# Patient Record
Sex: Male | Born: 1950 | Race: Black or African American | Hispanic: No | Smoking: Current every day smoker
Health system: Southern US, Community
[De-identification: ages and names within clinical notes are randomized; demographics above are authoritative.]

## PROBLEM LIST (undated history)

## (undated) DIAGNOSIS — I2510 Atherosclerotic heart disease of native coronary artery without angina pectoris: Secondary | ICD-10-CM

## (undated) DIAGNOSIS — Z952 Presence of prosthetic heart valve: Secondary | ICD-10-CM

## (undated) DIAGNOSIS — I5022 Chronic systolic (congestive) heart failure: Secondary | ICD-10-CM

## (undated) DIAGNOSIS — I10 Essential (primary) hypertension: Secondary | ICD-10-CM

## (undated) DIAGNOSIS — M109 Gout, unspecified: Secondary | ICD-10-CM

## (undated) DIAGNOSIS — K219 Gastro-esophageal reflux disease without esophagitis: Secondary | ICD-10-CM

## (undated) DIAGNOSIS — E119 Type 2 diabetes mellitus without complications: Secondary | ICD-10-CM

---

## 2008-02-02 ENCOUNTER — Emergency Department: Payer: Self-pay | Admitting: Emergency Medicine

## 2015-03-15 ENCOUNTER — Encounter: Payer: Self-pay | Admitting: Emergency Medicine

## 2015-03-15 ENCOUNTER — Emergency Department
Admission: EM | Admit: 2015-03-15 | Discharge: 2015-03-15 | Disposition: A | Discharge status: Home / Self Care | Payer: Medicare Other | Attending: Emergency Medicine | Admitting: Emergency Medicine

## 2015-03-15 DIAGNOSIS — Y9389 Activity, other specified: Secondary | ICD-10-CM | POA: Diagnosis not present

## 2015-03-15 DIAGNOSIS — Y92410 Unspecified street and highway as the place of occurrence of the external cause: Secondary | ICD-10-CM | POA: Diagnosis not present

## 2015-03-15 DIAGNOSIS — I10 Essential (primary) hypertension: Secondary | ICD-10-CM | POA: Insufficient documentation

## 2015-03-15 DIAGNOSIS — F17200 Nicotine dependence, unspecified, uncomplicated: Secondary | ICD-10-CM | POA: Diagnosis not present

## 2015-03-15 DIAGNOSIS — Y998 Other external cause status: Secondary | ICD-10-CM | POA: Diagnosis not present

## 2015-03-15 DIAGNOSIS — S199XXA Unspecified injury of neck, initial encounter: Secondary | ICD-10-CM | POA: Diagnosis present

## 2015-03-15 DIAGNOSIS — S161XXA Strain of muscle, fascia and tendon at neck level, initial encounter: Secondary | ICD-10-CM | POA: Diagnosis not present

## 2015-03-15 DIAGNOSIS — E119 Type 2 diabetes mellitus without complications: Secondary | ICD-10-CM | POA: Diagnosis not present

## 2015-03-15 HISTORY — DX: Gout, unspecified: M10.9

## 2015-03-15 HISTORY — DX: Essential (primary) hypertension: I10

## 2015-03-15 HISTORY — DX: Type 2 diabetes mellitus without complications: E11.9

## 2015-03-15 MED ORDER — BACLOFEN 10 MG PO TABS: 10.0000 mg | ORAL_TABLET | Freq: Three times a day (TID) | ORAL | Status: DC

## 2015-03-15 MED ORDER — NAPROXEN 500 MG PO TABS: 500.0000 mg | ORAL_TABLET | Freq: Two times a day (BID) | ORAL | Status: DC
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
## 2015-03-15 NOTE — Discharge Instructions (Signed)
Cervical Sprain  A cervical sprain is an injury in the neck in which the strong, fibrous tissues (ligaments) that connect your neck bones stretch or tear. Cervical sprains can range from mild to severe. Severe cervical sprains can cause the neck vertebrae to be unstable. This can lead to damage of the spinal cord and can result in serious nervous system problems. The amount of time it takes for a cervical sprain to get better depends on the cause and extent of the injury. Most cervical sprains heal in 1 to 3 weeks.  CAUSES   Severe cervical sprains may be caused by:    Contact sport injuries (such as from football, rugby, wrestling, hockey, auto racing, gymnastics, diving, martial arts, or boxing).    Motor vehicle collisions.    Whiplash injuries. This is an injury from a sudden forward and backward whipping movement of the head and neck.   Falls.   Mild cervical sprains may be caused by:    Being in an awkward position, such as while cradling a telephone between your ear and shoulder.    Sitting in a chair that does not offer proper support.    Working at a poorly designed computer station.    Looking up or down for long periods of time.   SYMPTOMS    Pain, soreness, stiffness, or a burning sensation in the front, back, or sides of the neck. This discomfort may develop immediately after the injury or slowly, 24 hours or more after the injury.    Pain or tenderness directly in the middle of the back of the neck.    Shoulder or upper back pain.    Limited ability to move the neck.    Headache.    Dizziness.    Weakness, numbness, or tingling in the hands or arms.    Muscle spasms.    Difficulty swallowing or chewing.    Tenderness and swelling of the neck.   DIAGNOSIS   Most of the time your health care provider can diagnose a cervical sprain by taking your history and doing a physical exam. Your health care provider will ask about previous neck injuries and any known neck  problems, such as arthritis in the neck. X-rays may be taken to find out if there are any other problems, such as with the bones of the neck. Other tests, such as a CT scan or MRI, may also be needed.   TREATMENT   Treatment depends on the severity of the cervical sprain. Mild sprains can be treated with rest, keeping the neck in place (immobilization), and pain medicines. Severe cervical sprains are immediately immobilized. Further treatment is done to help with pain, muscle spasms, and other symptoms and may include:   Medicines, such as pain relievers, numbing medicines, or muscle relaxants.    Physical therapy. This may involve stretching exercises, strengthening exercises, and posture training. Exercises and improved posture can help stabilize the neck, strengthen muscles, and help stop symptoms from returning.   HOME CARE INSTRUCTIONS    Put ice on the injured area.     Put ice in a plastic bag.     Place a towel between your skin and the bag.     Leave the ice on for 15-20 minutes, 3-4 times a day.    If your injury was severe, you may have been given a cervical collar to wear. A cervical collar is a two-piece collar designed to keep your neck from moving while it heals.      Do not remove the collar unless instructed by your health care provider.    If you have long hair, keep it outside of the collar.    Ask your health care provider before making any adjustments to your collar. Minor adjustments may be required over time to improve comfort and reduce pressure on your chin or on the back of your head.    Ifyou are allowed to remove the collar for cleaning or bathing, follow your health care provider's instructions on how to do so safely.    Keep your collar clean by wiping it with mild soap and water and drying it completely. If the collar you have been given includes removable pads, remove them every 1-2 days and hand wash them with soap and water. Allow them to air dry. They should be completely  dry before you wear them in the collar.    If you are allowed to remove the collar for cleaning and bathing, wash and dry the skin of your neck. Check your skin for irritation or sores. If you see any, tell your health care provider.    Do not drive while wearing the collar.    Only take over-the-counter or prescription medicines for pain, discomfort, or fever as directed by your health care provider.    Keep all follow-up appointments as directed by your health care provider.    Keep all physical therapy appointments as directed by your health care provider.    Make any needed adjustments to your workstation to promote good posture.    Avoid positions and activities that make your symptoms worse.    Warm up and stretch before being active to help prevent problems.   SEEK MEDICAL CARE IF:    Your pain is not controlled with medicine.    You are unable to decrease your pain medicine over time as planned.    Your activity level is not improving as expected.   SEEK IMMEDIATE MEDICAL CARE IF:    You develop any bleeding.   You develop stomach upset.   You have signs of an allergic reaction to your medicine.    Your symptoms get worse.    You develop new, unexplained symptoms.    You have numbness, tingling, weakness, or paralysis in any part of your body.   MAKE SURE YOU:    Understand these instructions.   Will watch your condition.   Will get help right away if you are not doing well or get worse.     This information is not intended to replace advice given to you by your health care provider. Make sure you discuss any questions you have with your health care provider.     Document Released: 12/19/2006 Document Revised: 02/26/2013 Document Reviewed: 08/29/2012  Elsevier Interactive Patient Education 2016 [employer]  Motor Vehicle Collision  It is common to have multiple bruises and sore muscles after a motor vehicle collision (MVC). These tend to feel worse for the first 24 hours.  You may have the most stiffness and soreness over the first several hours. You may also feel worse when you wake up the first morning after your collision. After this point, you will usually begin to improve with each day. The speed of improvement often depends on the severity of the collision, the number of injuries, and the location and nature of these injuries.  HOME CARE INSTRUCTIONS   Put ice on the injured area.   Put ice in a plastic bag.   Place   a towel between your skin and the bag.   Leave the ice on for 15-20 minutes, 3-4 times a day, or as directed by your health care provider.   Drink enough fluids to keep your urine clear or pale yellow. Do not drink alcohol.   Take a warm shower or bath once or twice a day. This will increase blood flow to sore muscles.   You may return to activities as directed by your caregiver. Be careful when lifting, as this may aggravate neck or back pain.   Only take over-the-counter or prescription medicines for pain, discomfort, or fever as directed by your caregiver. Do not use aspirin. This may increase bruising and bleeding.  SEEK IMMEDIATE MEDICAL CARE IF:   You have numbness, tingling, or weakness in the arms or legs.   You develop severe headaches not relieved with medicine.   You have severe neck pain, especially tenderness in the middle of the back of your neck.   You have changes in bowel or bladder control.   There is increasing pain in any area of the body.   You have shortness of breath, light-headedness, dizziness, or fainting.   You have chest pain.   You feel sick to your stomach (nauseous), throw up (vomit), or sweat.   You have increasing abdominal discomfort.   There is blood in your urine, stool, or vomit.   You have pain in your shoulder (shoulder strap areas).   You feel your symptoms are getting worse.  MAKE SURE YOU:   Understand these instructions.   Will watch your condition.   Will get help right away if you are not doing well  or get worse.     This information is not intended to replace advice given to you by your health care provider. Make sure you discuss any questions you have with your health care provider.     Document Released: 02/21/2005 Document Revised: 03/14/2014 Document Reviewed: 07/21/2010  Elsevier Interactive Patient Education 2016 [employer]
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
## 2015-03-20 MED ORDER — MORPHINE SULFATE (PF) 4 MG/ML IV SOLN
INTRAVENOUS | Status: AC
  Filled 2015-03-20: qty 1

## 2016-10-09 ENCOUNTER — Other Ambulatory Visit
Admit: 2016-10-09 | Discharge: 2016-10-09 | Disposition: A | Discharge status: Home / Self Care | Payer: Medicare Other | Attending: Family Medicine | Admitting: Family Medicine

## 2016-10-09 NOTE — Progress Notes (Signed)
Pt here with Officer Vear Clock from [employer] for lab draw. Pt gave verbal consent for labs to be drawn by this RN at this time. Puncture site was cleansed with betadine and was in the left [facility]. Pt tolerated the procedure well.  Officer Clinton given the blood samples and were placed in evidence collection bag with patient's seal over top.  Pt tolerated procedure well.  Pt escorted by [employer].

## 2017-07-24 ENCOUNTER — Emergency Department: Payer: Medicare Other

## 2017-07-24 ENCOUNTER — Other Ambulatory Visit: Payer: Self-pay

## 2017-07-24 ENCOUNTER — Encounter: Payer: Self-pay | Admitting: Emergency Medicine

## 2017-07-24 ENCOUNTER — Inpatient Hospital Stay
Admission: EM | Admit: 2017-07-24 | Discharge: 2017-07-26 | DRG: 247 | Disposition: A | Discharge status: Home / Self Care | Payer: Medicare Other | Attending: Internal Medicine | Admitting: Internal Medicine

## 2017-07-24 DIAGNOSIS — I2510 Atherosclerotic heart disease of native coronary artery without angina pectoris: Secondary | ICD-10-CM | POA: Diagnosis present

## 2017-07-24 DIAGNOSIS — S99921A Unspecified injury of right foot, initial encounter: Secondary | ICD-10-CM | POA: Diagnosis present

## 2017-07-24 DIAGNOSIS — Z79899 Other long term (current) drug therapy: Secondary | ICD-10-CM

## 2017-07-24 DIAGNOSIS — W208XXA Other cause of strike by thrown, projected or falling object, initial encounter: Secondary | ICD-10-CM | POA: Diagnosis present

## 2017-07-24 DIAGNOSIS — I119 Hypertensive heart disease without heart failure: Secondary | ICD-10-CM | POA: Diagnosis present

## 2017-07-24 DIAGNOSIS — F17210 Nicotine dependence, cigarettes, uncomplicated: Secondary | ICD-10-CM | POA: Diagnosis present

## 2017-07-24 DIAGNOSIS — Z8249 Family history of ischemic heart disease and other diseases of the circulatory system: Secondary | ICD-10-CM | POA: Diagnosis not present

## 2017-07-24 DIAGNOSIS — Z9114 Patient's other noncompliance with medication regimen: Secondary | ICD-10-CM | POA: Diagnosis not present

## 2017-07-24 DIAGNOSIS — I214 Non-ST elevation (NSTEMI) myocardial infarction: Principal | ICD-10-CM

## 2017-07-24 DIAGNOSIS — Z880 Allergy status to penicillin: Secondary | ICD-10-CM

## 2017-07-24 DIAGNOSIS — E119 Type 2 diabetes mellitus without complications: Secondary | ICD-10-CM | POA: Diagnosis present

## 2017-07-24 DIAGNOSIS — R42 Dizziness and giddiness: Secondary | ICD-10-CM | POA: Diagnosis present

## 2017-07-24 DIAGNOSIS — Z791 Long term (current) use of non-steroidal anti-inflammatories (NSAID): Secondary | ICD-10-CM | POA: Diagnosis not present

## 2017-07-24 DIAGNOSIS — E782 Mixed hyperlipidemia: Secondary | ICD-10-CM | POA: Diagnosis present

## 2017-07-24 DIAGNOSIS — Z716 Tobacco abuse counseling: Secondary | ICD-10-CM | POA: Diagnosis not present

## 2017-07-24 DIAGNOSIS — M109 Gout, unspecified: Secondary | ICD-10-CM | POA: Diagnosis present

## 2017-07-24 DIAGNOSIS — R52 Pain, unspecified: Secondary | ICD-10-CM

## 2017-07-24 LAB — COMPREHENSIVE METABOLIC PANEL
ALK PHOS: 69 U/L (ref 38–126)
ALT: 18 U/L (ref 17–63)
ANION GAP: 8 (ref 5–15)
AST: 24 U/L (ref 15–41)
Albumin: 4.1 g/dL (ref 3.5–5.0)
BILIRUBIN TOTAL: 1.1 mg/dL (ref 0.3–1.2)
BUN: 16 mg/dL (ref 6–20)
CALCIUM: 9.2 mg/dL (ref 8.9–10.3)
CO2: 22 mmol/L (ref 22–32)
Chloride: 108 mmol/L (ref 101–111)
Creatinine, Ser: 0.81 mg/dL (ref 0.61–1.24)
GFR calc Af Amer: >60 mL/min (ref >60)
GLUCOSE: 118 mg/dL — AB (ref 65–99)
POTASSIUM: 3.6 mmol/L (ref 3.5–5.1)
Sodium: 138 mmol/L (ref 135–145)
TOTAL PROTEIN: 8.1 g/dL (ref 6.5–8.1)

## 2017-07-24 LAB — APTT: aPTT: 32 seconds (ref 24–36)

## 2017-07-24 LAB — CBC
HEMATOCRIT: 55.2 % — AB (ref 40.0–52.0)
Hemoglobin: 18 g/dL (ref 13.0–18.0)
MCH: 26.8 pg (ref 26.0–34.0)
MCHC: 32.6 g/dL (ref 32.0–36.0)
MCV: 82.2 fL (ref 80.0–100.0)
PLATELETS: 313 10*3/uL (ref 150–440)
RBC: 6.72 MIL/uL — ABNORMAL HIGH (ref 4.40–5.90)
RDW: 17 % — AB (ref 11.5–14.5)
WBC: 8.2 10*3/uL (ref 3.8–10.6)

## 2017-07-24 LAB — LIPID PANEL
CHOL/HDL RATIO: 3.7 ratio
Cholesterol: 149 mg/dL (ref 0–200)
HDL: 40 mg/dL — AB (ref >40)
LDL CALC: 98 mg/dL (ref 0–99)
Triglycerides: 55 mg/dL (ref <150)
VLDL: 11 mg/dL (ref 0–40)

## 2017-07-24 LAB — PROTIME-INR
INR: 1.06
Prothrombin Time: 13.7 seconds (ref 11.4–15.2)

## 2017-07-24 LAB — TROPONIN I
TROPONIN I: 0.77 ng/mL — AB (ref <0.03)
TROPONIN I: 0.85 ng/mL — AB (ref <0.03)

## 2017-07-24 MED ORDER — HEPARIN (PORCINE) IN NACL 100-0.45 UNIT/ML-% IJ SOLN
900.0000 [IU]/h | INTRAMUSCULAR | Status: DC
  Administered 2017-07-24: 900 [IU]/h via INTRAVENOUS
  Filled 2017-07-24 (×2): qty 250

## 2017-07-24 MED ORDER — HEPARIN BOLUS VIA INFUSION
4000.0000 [IU] | Freq: Once | INTRAVENOUS | Status: AC
  Administered 2017-07-24: 4000 [IU] via INTRAVENOUS
  Filled 2017-07-24: qty 4000

## 2017-07-24 MED ORDER — METOPROLOL TARTRATE 25 MG PO TABS
25.0000 mg | ORAL_TABLET | Freq: Two times a day (BID) | ORAL | Status: DC
  Administered 2017-07-24 – 2017-07-25 (×2): 25 mg via ORAL
  Filled 2017-07-24 (×4): qty 1

## 2017-07-24 MED ORDER — NITROGLYCERIN 0.4 MG SL SUBL: 0.4000 mg | SUBLINGUAL_TABLET | SUBLINGUAL | Status: DC | PRN

## 2017-07-24 MED ORDER — ASPIRIN EC 325 MG PO TBEC
325.0000 mg | DELAYED_RELEASE_TABLET | Freq: Every day | ORAL | Status: DC
  Administered 2017-07-24: 325 mg via ORAL
  Filled 2017-07-24: qty 1

## 2017-07-24 NOTE — H&P (Signed)
[facility] [place] [facility]   PATIENT NAME: Travis Maxwell    MR#:  [id number]  DATE OF BIRTH:  Jan 03, 1951  DATE OF ADMISSION:  07/24/2017  PRIMARY CARE PHYSICIAN: System, Pcp Not In   REQUESTING/REFERRING PHYSICIAN: [email or web address]  CHIEF COMPLAINT:   Chief Complaint  Patient presents with  . Dizziness    HISTORY OF PRESENT ILLNESS: Travis Maxwell  is a 67 y.o. male with a known history of diabetes, hypertension, gout- was feeling dizzy today and so came to emergency room. He denies any associated chest pain, palpitation, shortness of breath. He denies any past history of coronary artery disease. Noted to have high troponin in ER, ER physician spoke to cardiologist and he suggested to start on heparin drip.  He had some weight fell on his right second toe and his toe is hurting.  PAST MEDICAL HISTORY:   Past Medical History:  Diagnosis Date  . Diabetes mellitus without complication (HCC)   . Gout   . Hypertension     PAST SURGICAL HISTORY: History reviewed. No pertinent surgical history.  SOCIAL HISTORY:  Social History   Tobacco Use  . Smoking status: Current Every Day Smoker    Packs/day: 0.50    Types: Cigarettes  Substance Use Topics  . Alcohol use: Yes    Comment: rare    FAMILY HISTORY:  Family History  Problem Relation Age of Onset  . CAD Mother   . CAD Father     DRUG ALLERGIES:  Allergies  Allergen Reactions  . Penicillins Other (See Comments)    Has patient had a PCN reaction causing immediate rash, facial/tongue/throat swelling, SOB or lightheadedness with hypotension: Unknown Has patient had a PCN reaction causing severe rash involving mucus membranes or skin necrosis: Unknown Has patient had a PCN reaction that required hospitalization: Unknown Has patient had a PCN reaction occurring within the last 10 years: No If all of the above answers are "NO", then may proceed with Cephalosporin use.     REVIEW OF SYSTEMS:    CONSTITUTIONAL: No fever, fatigue or weakness.  EYES: No blurred or double vision.  EARS, NOSE, AND THROAT: No tinnitus or ear pain.  RESPIRATORY: No cough, shortness of breath, wheezing or hemoptysis.  CARDIOVASCULAR: No chest pain, orthopnea, edema.  GASTROINTESTINAL: No nausea, vomiting, diarrhea or abdominal pain.  GENITOURINARY: No dysuria, hematuria.  ENDOCRINE: No polyuria, nocturia,  HEMATOLOGY: No anemia, easy bruising or bleeding SKIN: No rash or lesion. MUSCULOSKELETAL: No joint pain or arthritis.   NEUROLOGIC: No tingling, numbness, weakness.  PSYCHIATRY: No anxiety or depression.   MEDICATIONS AT HOME:  Prior to Admission medications   Not on File      PHYSICAL EXAMINATION:   VITAL SIGNS: Blood pressure (!) 156/76, pulse 65, temperature 98.7 F (37.1 C), temperature source Oral, resp. rate 18, height 5\' 7"  (1.702 m), weight 77.1 kg (170 lb), SpO2 99 %.  GENERAL:  67 y.o.-year-old patient lying in the bed with no acute distress.  EYES: Pupils equal, round, reactive to light and accommodation. No scleral icterus. Extraocular muscles intact.  HEENT: Head atraumatic, normocephalic. Oropharynx and nasopharynx clear.  NECK:  Supple, no jugular venous distention. No thyroid enlargement, no tenderness.  LUNGS: Normal breath sounds bilaterally, no wheezing, rales,rhonchi or crepitation. No use of accessory muscles of respiration.  CARDIOVASCULAR: S1, S2 normal. No murmurs, rubs, or gallops.  ABDOMEN: Soft, nontender, nondistended. Bowel sounds present. No organomegaly or mass.  EXTREMITIES: No pedal edema, cyanosis, or clubbing.  NEUROLOGIC: Cranial nerves II through XII are intact. Muscle strength 5/5 in all extremities. Sensation intact. Gait not checked.  PSYCHIATRIC: The patient is alert and oriented x 3.  SKIN: No obvious rash, lesion, or ulcer.   LABORATORY PANEL:   CBC Recent Labs  Lab 07/24/17 1836  WBC 8.2  HGB 18.0  HCT 55.2*  PLT 313  MCV 82.2  MCH  26.8  MCHC 32.6  RDW 17.0*   ------------------------------------------------------------------------------------------------------------------  Chemistries  Recent Labs  Lab 07/24/17 1836  NA 138  K 3.6  CL 108  CO2 22  GLUCOSE 118*  BUN 16  CREATININE 0.81  CALCIUM 9.2  AST 24  ALT 18  ALKPHOS 69  BILITOT 1.1   ------------------------------------------------------------------------------------------------------------------ estimated creatinine clearance is 85 mL/min (by C-G formula based on SCr of 0.81 mg/dL). ------------------------------------------------------------------------------------------------------------------ No results for input(s): TSH, T4TOTAL, T3FREE, THYROIDAB in the last 72 hours.  Invalid input(s): FREET3   Coagulation profile Recent Labs  Lab 07/24/17 2004  INR 1.06   ------------------------------------------------------------------------------------------------------------------- No results for input(s): DDIMER in the last 72 hours. -------------------------------------------------------------------------------------------------------------------  Cardiac Enzymes Recent Labs  Lab 07/24/17 1836  TROPONINI 0.77*   ------------------------------------------------------------------------------------------------------------------ Invalid input(s): POCBNP  ---------------------------------------------------------------------------------------------------------------  Urinalysis No results found for: COLORURINE, APPEARANCEUR, LABSPEC, PHURINE, GLUCOSEU, HGBUR, BILIRUBINUR, KETONESUR, PROTEINUR, UROBILINOGEN, NITRITE, LEUKOCYTESUR   RADIOLOGY: Ct Head Wo Contrast  Result Date: 07/24/2017 CLINICAL DATA:  Dizziness times 2-3 weeks. EXAM: CT HEAD WITHOUT CONTRAST TECHNIQUE: Contiguous axial images were obtained from the base of the skull through the vertex without intravenous contrast. COMPARISON:  None. FINDINGS: BRAIN: The ventricles and  sulci are normal for age. No intraparenchymal hemorrhage, mass effect nor midline shift. No acute large vascular territory infarcts. Grey-white matter distinction is maintained. The basal ganglia are unremarkable. No abnormal extra-axial fluid collections. Basal cisterns are not effaced and midline. The brainstem and cerebellar hemispheres are without acute abnormalities. VASCULAR: Unremarkable. SKULL/SOFT TISSUES: No skull fracture. No significant soft tissue swelling. ORBITS/SINUSES: The included ocular globes and orbital contents are normal.The mastoid air cells are clear. The included paranasal sinuses are well-aerated. OTHER: None. IMPRESSION: Normal head CT. Electronically Signed   By: Tollie Eth M.D.   On: 07/24/2017 18:49    EKG: Orders placed or performed during the hospital encounter of 07/24/17  . ED EKG  . ED EKG    IMPRESSION AND PLAN:  * non-ST elevation MI   IV heparin drip   Follow serial troponin and monitor on telemetry.   Check lipid panel and hemoglobin A1c.   Start on aspirin and metoprolol.   Cardiology consult for further management.  * hypertension   Was started on metoprolol for now and further medication as his blood pressure.  * diabetes   Currently check hemoglobin A1c.   No meds at home, we may need to decide need for medications at the time of discharge.  * active smoking   Counseled to quit smoking for 4 minutes and offered nicotine patch.  * right second toe pain   He had injury to the toe, I will get an x-ray to check for fractures.  * High hemoglobin   His RBC count and hemoglobin is very high   Could be polycythemia?   We may refer to hematology as outpatient.    All the records are reviewed and case discussed with ED provider. Management plans discussed with the patient, family and they are in agreement.  CODE STATUS:full code    TOTAL TIME TAKING CARE OF THIS PATIENT:  50 minutes.    Altamese Dilling M.D on 07/24/2017    Between 7am to 6pm - Pager - [phone]  After 6pm go to [email or web address] - password [email or web address]  [facility]  Office  [phone]  CC: Primary care physician; System, Pcp Not In   Note: This dictation was prepared with Dragon dictation along with smaller phrase technology. Any transcriptional errors that result from this process are unintentional.

## 2017-07-24 NOTE — ED Notes (Signed)
Family at bedside. 

## 2017-07-24 NOTE — ED Triage Notes (Addendum)
Dizzy x 2 to 3 weeks per patient. States began headache today. States has been out of blood pressure medications x 2 months.

## 2017-07-24 NOTE — Progress Notes (Signed)
Family Meeting Note  Advance Directive:yes  Today a meeting took place with the Patient.  The following clinical team members were present during this meeting:MD  The following were discussed:Patient's diagnosis: non-ST elevation MI, diabetes, active smoking , Patient's progosis: Unable to determine and Goals for treatment: Full Code  Additional follow-up to be provided: Cardiology  Time spent during discussion:20 minutes  Altamese Dilling, MD

## 2017-07-24 NOTE — Progress Notes (Signed)
ANTICOAGULATION CONSULT NOTE - Initial Consult  Pharmacy Consult for heparin gtt  Indication:  Chest pain/ ACS   No Known Allergies  Patient Measurements: Height: 5\' 7"  (170.2 cm) Weight: 170 lb (77.1 kg) IBW/kg (Calculated) : 66.1 Heparin Dosing Weight: 77.1kg  Vital Signs: Temp: 98.7 F (37.1 C) (05/20 1819) Temp Source: Oral (05/20 1819) BP: 164/67 (05/20 1819) Pulse Rate: 73 (05/20 1819)  Labs: Recent Labs    07/24/17 1836  HGB 18.0  HCT 55.2*  PLT 313  CREATININE 0.81  TROPONINI 0.77*    Estimated Creatinine Clearance: 85 mL/min (by C-G formula based on SCr of 0.81 mg/dL).   Medical History: Past Medical History:  Diagnosis Date  . Diabetes mellitus without complication (HCC)   . Gout   . Hypertension     Medications:   (Not in a hospital admission) Scheduled:  . heparin  4,000 Units Intravenous Once   Infusions:  . heparin     PRN:  Anti-infectives (From admission, onward)   None      Assessment: 67 year old male with ACS requiring heparin gtt.   Goal of Therapy:  Heparin level 0.3-0.7 units/ml Monitor platelets by anticoagulation protocol: Yes   Plan:  Give 4000 units bolus x 1 Start heparin infusion at 900 units/hr Check anti-Xa level in 6 hours and daily while on heparin Continue to monitor H&H and platelets  Travis Maxwell 07/24/2017,8:04 PM

## 2017-07-24 NOTE — ED Provider Notes (Signed)
[facility] Emergency Department Provider Note   ____________________________________________    I have reviewed the triage vital signs and the nursing notes.   HISTORY  Chief Complaint Dizziness     HPI Travis Maxwell is a 67 y.o. male with a history of diabetes and high blood pressure who presents with complaints of dizziness.  Patient reports he was fishing with his granddaughter today in the heat and felt quite lightheaded and thought he might pass out.  Denies chest pain.  No shortness of breath.  Still feels dizzy now.  Has not take anything for this.  No fevers or chills.   Past Medical History:  Diagnosis Date  . Diabetes mellitus without complication (HCC)   . Gout   . Hypertension     There are no active problems to display for this patient.   History reviewed. No pertinent surgical history.  Prior to Admission medications   Medication Sig Start Date End Date Taking? Authorizing Provider  baclofen (LIORESAL) 10 MG tablet Take 1 tablet (10 mg total) by mouth 3 (three) times daily. 03/15/15   Beers, Charmayne Sheer, PA-C  naproxen (NAPROSYN) 500 MG tablet Take 1 tablet (500 mg total) by mouth 2 (two) times daily with a meal. 03/15/15   Beers, Charmayne Sheer, PA-C     Allergies Patient has no known allergies.  No family history on file.  Social History Social History   Tobacco Use  . Smoking status: Current Every Day Smoker    Packs/day: 0.50    Types: Cigarettes  Substance Use Topics  . Alcohol use: Yes    Comment: rare  . Drug use: No    Review of Systems  Constitutional: No fever/chills Eyes: No visual changes.  ENT: No sore throat. Cardiovascular: Denies chest pain.  Positive dizziness as above Respiratory: Denies shortness of breath. Gastrointestinal: No abdominal pain.  No nausea, no vomiting.   Genitourinary: Negative for dysuria. Musculoskeletal: Negative for back pain. Skin: Negative for rash. Neurological: Negative for  headaches   ____________________________________________   PHYSICAL EXAM:  VITAL SIGNS: ED Triage Vitals  Enc Vitals Group     BP 07/24/17 1819 (!) 164/67     Pulse Rate 07/24/17 1819 73     Resp 07/24/17 1819 20     Temp 07/24/17 1819 98.7 F (37.1 C)     Temp Source 07/24/17 1819 Oral     SpO2 07/24/17 1819 98 %     Weight 07/24/17 1820 77.1 kg (170 lb)     Height 07/24/17 1820 1.702 m (5\' 7" )     Head Circumference --      Peak Flow --      Pain Score 07/24/17 1820 10     Pain Loc --      Pain Edu? --      Excl. in GC? --     Constitutional: Alert and oriented. No acute distress. Pleasant and interactive Eyes: Conjunctivae are normal.   Nose: No congestion/rhinnorhea. Mouth/Throat: Mucous membranes are moist.    Cardiovascular: Normal rate, regular rhythm. Grossly normal heart sounds.  Good peripheral circulation. Respiratory: Normal respiratory effort.  No retractions. Lungs CTAB. Gastrointestinal: Soft and nontender. No distention.  No CVA tenderness.  Musculoskeletal: No lower extremity tenderness nor edema.  Warm and well perfused Neurologic:  Normal speech and language. No gross focal neurologic deficits are appreciated.  Skin:  Skin is warm, dry and intact. No rash noted. Psychiatric: Mood and affect are normal. Speech and  behavior are normal.  ____________________________________________   LABS (all labs ordered are listed, but only abnormal results are displayed)  Labs Reviewed  CBC - Abnormal; Notable for the following components:      Result Value   RBC 6.72 (*)    HCT 55.2 (*)    RDW 17.0 (*)    All other components within normal limits  COMPREHENSIVE METABOLIC PANEL - Abnormal; Notable for the following components:   Glucose, Bld 118 (*)    All other components within normal limits  TROPONIN I - Abnormal; Notable for the following components:   Troponin I 0.77 (*)    All other components within normal limits    ____________________________________________  EKG  ED ECG REPORT I, Jene Every, the attending physician, personally viewed and interpreted this ECG.  Date: 07/24/2017  Rhythm: normal sinus rhythm QRS Axis: normal Intervals: normal ST/T Wave abnormalities: T wave inversions Narrative Interpretation: No old EKG  ____________________________________________  RADIOLOGY  CT head unremarkable ____________________________________________   PROCEDURES  Procedure(s) performed: No  Procedures   Critical Care performed: yes  CRITICAL CARE Performed by: Jene Every   Total critical care time: 30 minutes  Critical care time was exclusive of separately billable procedures and treating other patients.  Critical care was necessary to treat or prevent imminent or life-threatening deterioration.  Critical care was time spent personally by me on the following activities: development of treatment plan with patient and/or surrogate as well as nursing, discussions with consultants, evaluation of patient's response to treatment, examination of patient, obtaining history from patient or surrogate, ordering and performing treatments and interventions, ordering and review of laboratory studies, ordering and review of radiographic studies, pulse oximetry and re-evaluation of patient's condition.  ____________________________________________   INITIAL IMPRESSION / ASSESSMENT AND PLAN / ED COURSE  Pertinent labs & imaging results that were available during my care of the patient were reviewed by me and considered in my medical decision making (see chart for details).  Patient presents with lightheadedness, near syncopal episode.  He denies chest pain, T wave inversions on EKG.  Otherwise unremarkable exam  Notified of elevated troponin of 0.77  Discussed with Dr. Gwen Pounds, given history of diabetes and high blood pressure we will heparinize the patient     ____________________________________________   FINAL CLINICAL IMPRESSION(S) / ED DIAGNOSES  Final diagnoses:  NSTEMI (non-ST elevated myocardial infarction) [facility])  Dizziness        Note:  This document was prepared using Dragon voice recognition software and may include unintentional dictation errors.    Jene Every, MD 07/24/17 2002

## 2017-07-25 ENCOUNTER — Inpatient Hospital Stay
Admit: 2017-07-25 | Discharge: 2017-07-25 | Disposition: A | Discharge status: Home / Self Care | Payer: Medicare Other | Attending: Internal Medicine | Admitting: Internal Medicine

## 2017-07-25 ENCOUNTER — Encounter: Payer: Self-pay | Admitting: Internal Medicine

## 2017-07-25 ENCOUNTER — Encounter
Admission: EM | Disposition: A | Discharge status: Home / Self Care | Payer: Self-pay | Source: Home / Self Care | Attending: Specialist

## 2017-07-25 HISTORY — PX: LEFT HEART CATH AND CORONARY ANGIOGRAPHY: CATH118249

## 2017-07-25 HISTORY — PX: CORONARY STENT INTERVENTION: CATH118234

## 2017-07-25 LAB — BASIC METABOLIC PANEL
Anion gap: 7 (ref 5–15)
BUN: 14 mg/dL (ref 6–20)
CHLORIDE: 108 mmol/L (ref 101–111)
CO2: 23 mmol/L (ref 22–32)
Calcium: 8.9 mg/dL (ref 8.9–10.3)
Creatinine, Ser: 0.85 mg/dL (ref 0.61–1.24)
GFR calc Af Amer: >60 mL/min (ref >60)
Glucose, Bld: 122 mg/dL — ABNORMAL HIGH (ref 65–99)
POTASSIUM: 3.8 mmol/L (ref 3.5–5.1)
SODIUM: 138 mmol/L (ref 135–145)

## 2017-07-25 LAB — HEMOGLOBIN A1C
Hgb A1c MFr Bld: 5.7 % — ABNORMAL HIGH (ref 4.8–5.6)
MEAN PLASMA GLUCOSE: 116.89 mg/dL

## 2017-07-25 LAB — GLUCOSE, CAPILLARY: GLUCOSE-CAPILLARY: 103 mg/dL — AB (ref 65–99)

## 2017-07-25 LAB — ECHOCARDIOGRAM COMPLETE
Height: 67 in
WEIGHTICAEL: 2598.4 [oz_av]

## 2017-07-25 LAB — TROPONIN I
TROPONIN I: 0.91 ng/mL — AB (ref <0.03)
Troponin I: 0.76 ng/mL (ref <0.03)

## 2017-07-25 LAB — POCT ACTIVATED CLOTTING TIME: ACTIVATED CLOTTING TIME: 467 s

## 2017-07-25 LAB — CBC
HCT: 54.5 % — ABNORMAL HIGH (ref 40.0–52.0)
Hemoglobin: 18.3 g/dL — ABNORMAL HIGH (ref 13.0–18.0)
MCH: 27.8 pg (ref 26.0–34.0)
MCHC: 33.7 g/dL (ref 32.0–36.0)
MCV: 82.7 fL (ref 80.0–100.0)
Platelets: 305 10*3/uL (ref 150–440)
RBC: 6.59 MIL/uL — ABNORMAL HIGH (ref 4.40–5.90)
RDW: 16.8 % — AB (ref 11.5–14.5)
WBC: 8.2 10*3/uL (ref 3.8–10.6)

## 2017-07-25 LAB — HEPARIN LEVEL (UNFRACTIONATED): Heparin Unfractionated: 0.31 IU/mL (ref 0.30–0.70)

## 2017-07-25 SURGERY — LEFT HEART CATH AND CORONARY ANGIOGRAPHY
Anesthesia: Moderate Sedation

## 2017-07-25 MED ORDER — MIDAZOLAM HCL 2 MG/2ML IJ SOLN
INTRAMUSCULAR | Status: AC
  Filled 2017-07-25: qty 2

## 2017-07-25 MED ORDER — FENTANYL CITRATE (PF) 100 MCG/2ML IJ SOLN
INTRAMUSCULAR | Status: AC
  Filled 2017-07-25: qty 2

## 2017-07-25 MED ORDER — ATORVASTATIN CALCIUM 20 MG PO TABS
80.0000 mg | ORAL_TABLET | Freq: Every day | ORAL | Status: DC
  Administered 2017-07-25: 80 mg via ORAL
  Filled 2017-07-25: qty 4

## 2017-07-25 MED ORDER — SODIUM CHLORIDE 0.9% FLUSH: 3.0000 mL | INTRAVENOUS | Status: DC | PRN

## 2017-07-25 MED ORDER — SODIUM CHLORIDE 0.9 % WEIGHT BASED INFUSION
1.0000 mL/kg/h | INTRAVENOUS | Status: AC
  Administered 2017-07-25 (×2): 1 mL/kg/h via INTRAVENOUS

## 2017-07-25 MED ORDER — HYDRALAZINE HCL 20 MG/ML IJ SOLN: 5.0000 mg | INTRAMUSCULAR | Status: AC | PRN

## 2017-07-25 MED ORDER — FENTANYL CITRATE (PF) 100 MCG/2ML IJ SOLN
INTRAMUSCULAR | Status: DC | PRN
  Administered 2017-07-25 (×2): 25 ug via INTRAVENOUS

## 2017-07-25 MED ORDER — NITROGLYCERIN 5 MG/ML IV SOLN
INTRAVENOUS | Status: AC
  Filled 2017-07-25: qty 10

## 2017-07-25 MED ORDER — ASPIRIN 81 MG PO CHEW
CHEWABLE_TABLET | ORAL | Status: AC
  Filled 2017-07-25: qty 3

## 2017-07-25 MED ORDER — ONDANSETRON HCL 4 MG/2ML IJ SOLN
4.0000 mg | Freq: Four times a day (QID) | INTRAMUSCULAR | Status: DC | PRN
Start: 2017-07-25 — End: 2017-07-26

## 2017-07-25 MED ORDER — BIVALIRUDIN TRIFLUOROACETATE 250 MG IV SOLR
INTRAVENOUS | Status: AC
  Filled 2017-07-25: qty 250

## 2017-07-25 MED ORDER — SODIUM CHLORIDE 0.9 % IV SOLN: 250.0000 mL | INTRAVENOUS | Status: DC | PRN

## 2017-07-25 MED ORDER — SODIUM CHLORIDE 0.9% FLUSH
3.0000 mL | Freq: Two times a day (BID) | INTRAVENOUS | Status: DC
  Administered 2017-07-25 – 2017-07-26 (×2): 3 mL via INTRAVENOUS

## 2017-07-25 MED ORDER — SODIUM CHLORIDE 0.9 % WEIGHT BASED INFUSION: 1.0000 mL/kg/h | INTRAVENOUS | Status: DC

## 2017-07-25 MED ORDER — BIVALIRUDIN BOLUS VIA INFUSION - CUPID: INTRAVENOUS | Status: DC | PRN

## 2017-07-25 MED ORDER — LABETALOL HCL 5 MG/ML IV SOLN: 10.0000 mg | INTRAVENOUS | Status: AC | PRN

## 2017-07-25 MED ORDER — LIDOCAINE HCL (PF) 1 % IJ SOLN
INTRAMUSCULAR | Status: AC
  Filled 2017-07-25: qty 30

## 2017-07-25 MED ORDER — TICAGRELOR 90 MG PO TABS
90.0000 mg | ORAL_TABLET | Freq: Two times a day (BID) | ORAL | Status: DC
  Administered 2017-07-25 – 2017-07-26 (×2): 90 mg via ORAL
  Filled 2017-07-25 (×3): qty 1

## 2017-07-25 MED ORDER — BIVALIRUDIN TRIFLUOROACETATE 250 MG IV SOLR
0.2500 mg/kg/h | INTRAVENOUS | Status: AC
  Filled 2017-07-25: qty 250

## 2017-07-25 MED ORDER — BIVALIRUDIN BOLUS VIA INFUSION - CUPID
INTRAVENOUS | Status: DC | PRN
  Administered 2017-07-25: 55.275 mg via INTRAVENOUS

## 2017-07-25 MED ORDER — SODIUM CHLORIDE 0.9 % IV SOLN
INTRAVENOUS | Status: AC | PRN
  Administered 2017-07-25: 1.75 mg/kg/h via INTRAVENOUS

## 2017-07-25 MED ORDER — ACETAMINOPHEN 325 MG PO TABS: 650.0000 mg | ORAL_TABLET | ORAL | Status: DC | PRN

## 2017-07-25 MED ORDER — FENTANYL CITRATE (PF) 100 MCG/2ML IJ SOLN
INTRAMUSCULAR | Status: DC | PRN
  Administered 2017-07-25: 25 ug via INTRAVENOUS

## 2017-07-25 MED ORDER — ASPIRIN 81 MG PO CHEW
CHEWABLE_TABLET | ORAL | Status: DC | PRN
  Administered 2017-07-25: 243 mg via ORAL

## 2017-07-25 MED ORDER — ASPIRIN 81 MG PO CHEW
CHEWABLE_TABLET | ORAL | Status: AC
  Filled 2017-07-25: qty 1

## 2017-07-25 MED ORDER — TICAGRELOR 90 MG PO TABS
ORAL_TABLET | ORAL | Status: AC
  Filled 2017-07-25: qty 2

## 2017-07-25 MED ORDER — MORPHINE SULFATE (PF) 2 MG/ML IV SOLN
INTRAVENOUS | Status: AC
  Filled 2017-07-25: qty 1

## 2017-07-25 MED ORDER — SODIUM CHLORIDE 0.9% FLUSH: 3.0000 mL | Freq: Two times a day (BID) | INTRAVENOUS | Status: DC

## 2017-07-25 MED ORDER — MIDAZOLAM HCL 2 MG/2ML IJ SOLN
INTRAMUSCULAR | Status: DC | PRN
  Administered 2017-07-25: 1 mg via INTRAVENOUS

## 2017-07-25 MED ORDER — ASPIRIN 81 MG PO CHEW
81.0000 mg | CHEWABLE_TABLET | ORAL | Status: AC
  Administered 2017-07-25: 81 mg via ORAL

## 2017-07-25 MED ORDER — IOPAMIDOL (ISOVUE-300) INJECTION 61%
INTRAVENOUS | Status: DC | PRN
  Administered 2017-07-25: 140 mL via INTRA_ARTERIAL

## 2017-07-25 MED ORDER — IOPAMIDOL (ISOVUE-300) INJECTION 61%
INTRAVENOUS | Status: DC | PRN
  Administered 2017-07-25: 165 mL via INTRA_ARTERIAL

## 2017-07-25 MED ORDER — SODIUM CHLORIDE 0.9 % WEIGHT BASED INFUSION
3.0000 mL/kg/h | INTRAVENOUS | Status: DC
  Administered 2017-07-25: 3 mL/kg/h via INTRAVENOUS

## 2017-07-25 MED ORDER — DOCUSATE SODIUM 100 MG PO CAPS: 100.0000 mg | ORAL_CAPSULE | Freq: Two times a day (BID) | ORAL | Status: DC | PRN

## 2017-07-25 MED ORDER — ASPIRIN 81 MG PO CHEW
81.0000 mg | CHEWABLE_TABLET | Freq: Every day | ORAL | Status: DC
  Administered 2017-07-26: 81 mg via ORAL
  Filled 2017-07-25: qty 1

## 2017-07-25 MED ORDER — MORPHINE SULFATE (PF) 2 MG/ML IV SOLN
1.0000 mg | Freq: Once | INTRAVENOUS | Status: AC
  Administered 2017-07-25: 2 mg via INTRAVENOUS

## 2017-07-25 MED ORDER — TICAGRELOR 90 MG PO TABS
ORAL_TABLET | ORAL | Status: DC | PRN
  Administered 2017-07-25: 180 mg via ORAL

## 2017-07-25 SURGICAL SUPPLY — 16 items
BALLN TREK RX 2.25X12 (BALLOONS) ×3
BALLOON TREK RX 2.25X12 (BALLOONS) ×1 IMPLANT
CATH INFINITI 5FR ANG PIGTAIL (CATHETERS) ×3 IMPLANT
CATH INFINITI 5FR JL4 (CATHETERS) ×3 IMPLANT
CATH INFINITI JR4 5F (CATHETERS) ×3 IMPLANT
CATH VISTA GUIDE 6FR JR4 SH (CATHETERS) ×3 IMPLANT
DEVICE CLOSURE MYNXGRIP 6/7F (Vascular Products) ×3 IMPLANT
DEVICE INFLAT 30 PLUS (MISCELLANEOUS) ×3 IMPLANT
KIT MANI 3VAL PERCEP (MISCELLANEOUS) ×3 IMPLANT
NEEDLE PERC 18GX7CM (NEEDLE) ×3 IMPLANT
PACK CARDIAC CATH (CUSTOM PROCEDURE TRAY) ×3 IMPLANT
SHEATH AVANTI 5FR X 11CM (SHEATH) ×3 IMPLANT
SHEATH AVANTI 6FR X 11CM (SHEATH) ×3 IMPLANT
STENT RESOLUTE ONYX 2.0X12 (Permanent Stent) ×3 IMPLANT
WIRE G HI TQ BMW 190 (WIRE) ×3 IMPLANT
WIRE GUIDERIGHT .035X150 (WIRE) ×3 IMPLANT

## 2017-07-25 NOTE — Progress Notes (Signed)
*  PRELIMINARY RESULTS* Echocardiogram 2D Echocardiogram has been performed.  Travis Maxwell 07/25/2017, 3:30 PM

## 2017-07-25 NOTE — Progress Notes (Signed)
Pt. tx to room now via bed. Right groin remains clean, dry, intact without hematoma, edema, ecchymosis. PAD intact. Pt. States his back pain is a "1" on a scale 1-10. Stable for tx. Denies any c/o CP, SOB, N/V, dizziness.

## 2017-07-25 NOTE — Progress Notes (Signed)
[facility] Cardiology [facility] Encounter Note  Patient: Travis Maxwell / Admit Date: 07/24/2017 / Date of Encounter: 07/25/2017, 9:36 AM   Subjective: No evidence of chest pain.  Patient is improving clinically with no evidence of weakness fatigue or dizziness although a troponin level elevated consistent with non-ST elevation myocardial infarction Cardiac catheterization shows inferior hypokinesis with ejection fraction of 40% Left coronary artery system normal with 99% proximal right coronary artery stenosis  Review of Systems: Positive for: Weakness Negative for: Vision change, hearing change, syncope, dizziness, nausea, vomiting,diarrhea, bloody stool, stomach pain, cough, congestion, diaphoresis, urinary frequency, urinary pain,skin lesions, skin rashes Others previously listed  Objective: Telemetry: Normal sinus rhythm Physical Exam: Blood pressure (!) 131/55, pulse (!) 59, temperature 98.4 F (36.9 C), resp. rate 18, height 5\' 7"  (1.702 m), weight 162 lb 6.4 oz (73.7 kg), SpO2 97 %. Body mass index is 25.44 kg/m. General: Well developed, well nourished, in no acute distress. Head: Normocephalic, atraumatic, sclera non-icteric, no xanthomas, nares are without discharge. Neck: No apparent masses Lungs: Normal respirations with no wheezes, no rhonchi, no rales , no crackles   Heart: Regular rate and rhythm, normal S1 S2, no murmur, no rub, no gallop, PMI is normal size and placement, carotid upstroke normal without bruit, jugular venous pressure normal Abdomen: Soft, non-tender, non-distended with normoactive bowel sounds. No hepatosplenomegaly. Abdominal aorta is normal size without bruit Extremities: No edema, no clubbing, no cyanosis, no ulcers,  Peripheral: 2+ radial, 2+ femoral, 2+ dorsal pedal pulses Neuro: Alert and oriented. Moves all extremities spontaneously. Psych:  Responds to questions appropriately with a normal affect.   Intake/Output Summary (Last 24 hours) at  07/25/2017 0936 Last data filed at 07/25/2017 0700 Gross per 24 hour  Intake 0 ml  Output 100 ml  Net -100 ml    Inpatient Medications:  . aspirin      . [MAR Hold] aspirin EC  325 mg Oral Daily  . [MAR Hold] metoprolol tartrate  25 mg Oral BID  . sodium chloride flush  3 mL Intravenous Q12H   Infusions:  . sodium chloride    . [START ON 07/26/2017] sodium chloride 3 mL/kg/hr (07/25/17 0803)   Followed by  . [START ON 07/26/2017] sodium chloride    . heparin 900 Units/hr (07/24/17 2018)    Labs: Recent Labs    07/24/17 1836 07/25/17 0501  NA 138 138  K 3.6 3.8  CL 108 108  CO2 22 23  GLUCOSE 118* 122*  BUN 16 14  CREATININE 0.81 0.85  CALCIUM 9.2 8.9   Recent Labs    07/24/17 1836  AST 24  ALT 18  ALKPHOS 69  BILITOT 1.1  PROT 8.1  ALBUMIN 4.1   Recent Labs    07/24/17 1836 07/25/17 0501  WBC 8.2 8.2  HGB 18.0 18.3*  HCT 55.2* 54.5*  MCV 82.2 82.7  PLT 313 305   Recent Labs    07/24/17 1836 07/24/17 2252 07/25/17 0126 07/25/17 0501  TROPONINI 0.77* 0.85* 0.91* 0.76*   Invalid input(s): POCBNP Recent Labs    07/24/17 2252  HGBA1C 5.7*     Weights: Filed Weights   07/24/17 1820 07/24/17 2352  Weight: 170 lb (77.1 kg) 162 lb 6.4 oz (73.7 kg)     Radiology/Studies:  Ct Head Wo Contrast  Result Date: 07/24/2017 CLINICAL DATA:  Dizziness times 2-3 weeks. EXAM: CT HEAD WITHOUT CONTRAST TECHNIQUE: Contiguous axial images were obtained from the base of the skull through the vertex without intravenous contrast.  COMPARISON:  None. FINDINGS: BRAIN: The ventricles and sulci are normal for age. No intraparenchymal hemorrhage, mass effect nor midline shift. No acute large vascular territory infarcts. Grey-white matter distinction is maintained. The basal ganglia are unremarkable. No abnormal extra-axial fluid collections. Basal cisterns are not effaced and midline. The brainstem and cerebellar hemispheres are without acute abnormalities. VASCULAR:  Unremarkable. SKULL/SOFT TISSUES: No skull fracture. No significant soft tissue swelling. ORBITS/SINUSES: The included ocular globes and orbital contents are normal.The mastoid air cells are clear. The included paranasal sinuses are well-aerated. OTHER: None. IMPRESSION: Normal head CT. Electronically Signed   By: Tollie Eth M.D.   On: 07/24/2017 18:49   Dg Foot Complete Right  Result Date: 07/24/2017 CLINICAL DATA:  Weight fell on second toe. EXAM: RIGHT FOOT COMPLETE - 3+ VIEW COMPARISON:  None. FINDINGS: There is no evidence of fracture or dislocation. There is no evidence of arthropathy or other focal bone abnormality. Relative soft tissue swelling of the second toe. IMPRESSION: No acute fracture or joint dislocations. Soft tissue swelling of the proximal second toe. Electronically Signed   By: Tollie Eth M.D.   On: 07/24/2017 21:41     Assessment and Recommendation  67 y.o. male with essential hypertension mixed hyperlipidemia and noncompliance of medication management with a non-ST elevation myocardial infarction Via catheterization showing mild LV systolic dysfunction with inferior hypokinesis and significant 99% stenosis of mild proximal right coronary artery 1.  PCI and stent placement of right coronary artery 2.  High intensity cholesterol therapy 3.  Dual antiplatelet therapy 4.  Cardiac rehabilitation   Signed, Arnoldo Hooker M.D. FACC

## 2017-07-25 NOTE — Progress Notes (Signed)
ANTICOAGULATION CONSULT NOTE - Initial Consult  Pharmacy Consult for heparin gtt  Indication:  Chest pain/ ACS   Allergies  Allergen Reactions  . Penicillins Other (See Comments)    Has patient had a PCN reaction causing immediate rash, facial/tongue/throat swelling, SOB or lightheadedness with hypotension: Unknown Has patient had a PCN reaction causing severe rash involving mucus membranes or skin necrosis: Unknown Has patient had a PCN reaction that required hospitalization: Unknown Has patient had a PCN reaction occurring within the last 10 years: No If all of the above answers are "NO", then may proceed with Cephalosporin use.     Patient Measurements: Height: 5\' 7"  (170.2 cm) Weight: 162 lb 6.4 oz (73.7 kg) IBW/kg (Calculated) : 66.1 Heparin Dosing Weight: 77.1kg  Vital Signs: Temp: 98.6 F (37 C) (05/20 2352) Temp Source: Oral (05/20 2352) BP: 137/66 (05/20 2352) Pulse Rate: 59 (05/20 2352)  Labs: Recent Labs    07/24/17 1836 07/24/17 2004 07/24/17 2252 07/25/17 0126  HGB 18.0  --   --   --   HCT 55.2*  --   --   --   PLT 313  --   --   --   APTT  --  32  --   --   LABPROT  --  13.7  --   --   INR  --  1.06  --   --   HEPARINUNFRC  --   --   --  0.31  CREATININE 0.81  --   --   --   TROPONINI 0.77*  --  0.85* 0.91*    Estimated Creatinine Clearance: 85 mL/min (by C-G formula based on SCr of 0.81 mg/dL).   Medical History: Past Medical History:  Diagnosis Date  . Diabetes mellitus without complication (HCC)   . Gout   . Hypertension     Medications:  No medications prior to admission.   Scheduled:  . aspirin EC  325 mg Oral Daily  . metoprolol tartrate  25 mg Oral BID   Infusions:  . heparin 900 Units/hr (07/24/17 2018)   PRN:  Anti-infectives (From admission, onward)   None      Assessment: 67 year old male with ACS requiring heparin gtt.   Goal of Therapy:  Heparin level 0.3-0.7 units/ml Monitor platelets by anticoagulation  protocol: Yes   Plan:  05/21 @ 0130 HL 0.31 therapeutic. Will continue @ 900 units/hr and will recheck @ 0800, will check CBC w/ am labs.  Thomasene Ripple, PharmD, BCPS Clinical Pharmacist 07/25/2017

## 2017-07-25 NOTE — Progress Notes (Signed)
Per MD orders, HOB up 30 degrees. Right groin remains without hematoma, edema, ecchymosis, drainage.

## 2017-07-25 NOTE — Progress Notes (Signed)
Pt. C/o severe "sharp" lower back pain upon arrival from cath lab. Call made to MD & new orders received. Pt. Med. With Morphine 1 mg slow IVP. Pt. Denies c/o CP,SOB,dizziness, HA, groin discomfort. Right groin without complications noted at present. PAD INTACT WITH 40 ML AIR.

## 2017-07-25 NOTE — Progress Notes (Signed)
[facility] [place] [facility]   PATIENT NAME: Travis Maxwell    MR#:  [id number]  DATE OF BIRTH:  [birth year]  SUBJECTIVE:   Patient here due to dizziness and noted to have an elevated troponin.  Ruled in for a non-ST elevation MI and underwent cardiac catheterization with drug-eluting stent to the mid RCA.  Currently chest pain-free denies any dizziness or shortness of breath.  REVIEW OF SYSTEMS:    Review of Systems  Constitutional: Negative for chills and fever.  HENT: Negative for congestion and tinnitus.   Eyes: Negative for blurred vision and double vision.  Respiratory: Negative for cough, shortness of breath and wheezing.   Cardiovascular: Negative for chest pain, orthopnea and PND.  Gastrointestinal: Negative for abdominal pain, diarrhea, nausea and vomiting.  Genitourinary: Negative for dysuria and hematuria.  Neurological: Negative for dizziness, sensory change and focal weakness.  All other systems reviewed and are negative.   Nutrition: Heart Healthy Tolerating Diet: Yes Tolerating PT: Ambulatory  DRUG ALLERGIES:   Allergies  Allergen Reactions  . Penicillins Other (See Comments)    Has patient had a PCN reaction causing immediate rash, facial/tongue/throat swelling, SOB or lightheadedness with hypotension: Unknown Has patient had a PCN reaction causing severe rash involving mucus membranes or skin necrosis: Unknown Has patient had a PCN reaction that required hospitalization: Unknown Has patient had a PCN reaction occurring within the last 10 years: No If all of the above answers are "NO", then may proceed with Cephalosporin use.     VITALS:  Blood pressure (!) 159/61, pulse 61, temperature 98.6 F (37 C), temperature source Oral, resp. rate 18, height 5\' 7"  (1.702 m), weight 73.7 kg (162 lb 6.4 oz), SpO2 100 %.  PHYSICAL EXAMINATION:   Physical Exam  GENERAL:  67 y.o.-year-old patient lying in bed in no acute distress.  EYES:  Pupils equal, round, reactive to light and accommodation. No scleral icterus. Extraocular muscles intact.  HEENT: Head atraumatic, normocephalic. Oropharynx and nasopharynx clear.  NECK:  Supple, no jugular venous distention. No thyroid enlargement, no tenderness.  LUNGS: Normal breath sounds bilaterally, no wheezing, rales, rhonchi. No use of accessory muscles of respiration.  CARDIOVASCULAR: S1, S2 normal. No murmurs, rubs, or gallops.  ABDOMEN: Soft, nontender, nondistended. Bowel sounds present. No organomegaly or mass.  EXTREMITIES: No cyanosis, clubbing or edema b/l.    NEUROLOGIC: Cranial nerves II through XII are intact. No focal Motor or sensory deficits b/l.   PSYCHIATRIC: The patient is alert and oriented x 3.  SKIN: No obvious rash, lesion, or ulcer.    LABORATORY PANEL:   CBC Recent Labs  Lab 07/25/17 0501  WBC 8.2  HGB 18.3*  HCT 54.5*  PLT 305   ------------------------------------------------------------------------------------------------------------------  Chemistries  Recent Labs  Lab 07/24/17 1836 07/25/17 0501  NA 138 138  K 3.6 3.8  CL 108 108  CO2 22 23  GLUCOSE 118* 122*  BUN 16 14  CREATININE 0.81 0.85  CALCIUM 9.2 8.9  AST 24  --   ALT 18  --   ALKPHOS 69  --   BILITOT 1.1  --    ------------------------------------------------------------------------------------------------------------------  Cardiac Enzymes Recent Labs  Lab 07/25/17 0501  TROPONINI 0.76*   ------------------------------------------------------------------------------------------------------------------  RADIOLOGY:  Ct Head Wo Contrast  Result Date: 07/24/2017 CLINICAL DATA:  Dizziness times 2-3 weeks. EXAM: CT HEAD WITHOUT CONTRAST TECHNIQUE: Contiguous axial images were obtained from the base of the skull through the vertex without intravenous contrast. COMPARISON:  None. FINDINGS:  BRAIN: The ventricles and sulci are normal for age. No intraparenchymal hemorrhage,  mass effect nor midline shift. No acute large vascular territory infarcts. Grey-white matter distinction is maintained. The basal ganglia are unremarkable. No abnormal extra-axial fluid collections. Basal cisterns are not effaced and midline. The brainstem and cerebellar hemispheres are without acute abnormalities. VASCULAR: Unremarkable. SKULL/SOFT TISSUES: No skull fracture. No significant soft tissue swelling. ORBITS/SINUSES: The included ocular globes and orbital contents are normal.The mastoid air cells are clear. The included paranasal sinuses are well-aerated. OTHER: None. IMPRESSION: Normal head CT. Electronically Signed   By: Tollie Eth M.D.   On: 07/24/2017 18:49   Dg Foot Complete Right  Result Date: 07/24/2017 CLINICAL DATA:  Weight fell on second toe. EXAM: RIGHT FOOT COMPLETE - 3+ VIEW COMPARISON:  None. FINDINGS: There is no evidence of fracture or dislocation. There is no evidence of arthropathy or other focal bone abnormality. Relative soft tissue swelling of the second toe. IMPRESSION: No acute fracture or joint dislocations. Soft tissue swelling of the proximal second toe. Electronically Signed   By: Tollie Eth M.D.   On: 07/24/2017 21:41     ASSESSMENT AND PLAN:   67 year old male with past medical history of diabetes, gout, hypertension but currently on no medications and has not seen a doctor in a while presented to the hospital due to dizziness and noted to have an elevated troponin.  1.  Non-ST elevation MI-patient ruled in by cardiac markers.  Seen by cardiology underwent a cardiac catheterization with a drug-eluting stent to the mid RCA. -Currently chest pain-free and stable.  Denies any dizziness. -Continue aspirin, Brilinta, metoprolol, will start Atorvastatin.   2.  Hypertension-continue metoprolol.  3.  History of gout no acute attack.  X-ray of the right foot showed no acute pathology.  4.  Diabetes-patient's blood sugars are stable.  Patient was on any  medications.  Check hemoglobin A1c.     All the records are reviewed and case discussed with Care Management/Social Worker. Management plans discussed with the patient, family and they are in agreement.  CODE STATUS: Full code  DVT Prophylaxis: Heparin drip  TOTAL TIME TAKING CARE OF THIS PATIENT: 30 minutes.   POSSIBLE D/C IN 1-2 DAYS, DEPENDING ON CLINICAL CONDITION.   Houston Siren M.D on 07/25/2017 at 3:42 PM  Between 7am to 6pm - Pager - [phone]  After 6pm go to [email or web address] - Social research officer, government  [facility] [place] [facility]  Office  [phone]  CC: Primary care physician; System, Pcp Not In

## 2017-07-25 NOTE — Plan of Care (Signed)
Patient back from cardiac cath, Right groin clean, dry, intact without hematoma, edema, ecchymosis. PAD intact with 40 cc of air.  Problem: Education: Goal: Knowledge of General Education information will improve Outcome: Progressing   Problem: Clinical Measurements: Goal: Will remain free from infection Outcome: Progressing Goal: Cardiovascular complication will be avoided Outcome: Progressing

## 2017-07-25 NOTE — Consult Note (Signed)
[facility] Cardiology Consultation Note  Patient ID: Travis Maxwell, MRN: [id number], DOB/AGE: October 05, 1950 67 y.o. Admit date: 07/24/2017   Date of Consult: 07/25/2017 Primary Physician: System, Pcp Not In Primary Cardiologist: None  Chief Complaint:  Chief Complaint  Patient presents with  . Dizziness   Reason for Consult: Non-ST elevation myocardial infarction  HPI: 67 y.o. male with known diabetes essential hypertension mixed hyperlipidemia who has not been on appropriate treatment due to noncompliance.  The patient has had no current issues with chest pain shortness of breath in the last several months but was fishing when he had some dizziness nausea and some mild diaphoresis.  He felt sick to his stomach and was unable to continue.  When seen in the emergency room he felt washed out and weak.  At that time the patient had an EKG showing normal sinus rhythm left axis deviation left atrial enlargement left ventricular hypertrophy and lateral T wave inversions possibly consistent with myocardial infarction.  His troponin level has elevated with a peak of 0.91 consistent with a possible non-ST elevation myocardial infarction.  The patient was placed on heparin metoprolol and aspirin and has had no further symptoms overnight and has done fairly well.  Currently he is stable with no symptoms  Past Medical History:  Diagnosis Date  . Diabetes mellitus without complication (HCC)   . Gout   . Hypertension       Surgical History: History reviewed. No pertinent surgical history.   Home Meds: Prior to Admission medications   Not on File    Inpatient Medications:  . aspirin EC  325 mg Oral Daily  . metoprolol tartrate  25 mg Oral BID   . heparin 900 Units/hr (07/24/17 2018)    Allergies:  Allergies  Allergen Reactions  . Penicillins Other (See Comments)    Has patient had a PCN reaction causing immediate rash, facial/tongue/throat swelling, SOB or lightheadedness with  hypotension: Unknown Has patient had a PCN reaction causing severe rash involving mucus membranes or skin necrosis: Unknown Has patient had a PCN reaction that required hospitalization: Unknown Has patient had a PCN reaction occurring within the last 10 years: No If all of the above answers are "NO", then may proceed with Cephalosporin use.     Social History   Socioeconomic History  . Marital status: Married    Spouse name: Not on file  . Number of children: Not on file  . Years of education: Not on file  . Highest education level: Not on file  Occupational History  . Not on file  Social Needs  . Financial resource strain: Not on file  . Food insecurity:    Worry: Not on file    Inability: Not on file  . Transportation needs:    Medical: Not on file    Non-medical: Not on file  Tobacco Use  . Smoking status: Current Every Day Smoker    Packs/day: 0.50    Types: Cigarettes  . Smokeless tobacco: Never Used  Substance and Sexual Activity  . Alcohol use: Yes    Comment: rare  . Drug use: No  . Sexual activity: Not on file  Lifestyle  . Physical activity:    Days per week: Not on file    Minutes per session: Not on file  . Stress: Not on file  Relationships  . Social connections:    Talks on phone: Not on file    Gets together: Not on file    Attends religious  service: Not on file    Active member of club or organization: Not on file    Attends meetings of clubs or organizations: Not on file    Relationship status: Not on file  . Intimate partner violence:    Fear of current or ex partner: Not on file    Emotionally abused: Not on file    Physically abused: Not on file    Forced sexual activity: Not on file  Other Topics Concern  . Not on file  Social History Narrative  . Not on file     Family History  Problem Relation Age of Onset  . CAD Mother   . CAD Father      Review of Systems Positive for dizziness Negative for: General:  chills, fever, night  sweats or weight changes.  Cardiovascular: PND orthopnea syncope positive for dizziness  Dermatological skin lesions rashes Respiratory: Cough congestion Urologic: Frequent urination urination at night and hematuria Abdominal: negative for nausea, vomiting, diarrhea, bright red blood per rectum, melena, or hematemesis Neurologic: negative for visual changes, and/or hearing changes  All other systems reviewed and are otherwise negative except as noted above.  Labs: Recent Labs    07/24/17 1836 07/24/17 2252 07/25/17 0126 07/25/17 0501  TROPONINI 0.77* 0.85* 0.91* 0.76*   Lab Results  Component Value Date   WBC 8.2 07/25/2017   HGB 18.3 (H) 07/25/2017   HCT 54.5 (H) 07/25/2017   MCV 82.7 07/25/2017   PLT 305 07/25/2017    Recent Labs  Lab 07/24/17 1836 07/25/17 0501  NA 138 138  K 3.6 3.8  CL 108 108  CO2 22 23  BUN 16 14  CREATININE 0.81 0.85  CALCIUM 9.2 8.9  PROT 8.1  --   BILITOT 1.1  --   ALKPHOS 69  --   ALT 18  --   AST 24  --   GLUCOSE 118* 122*   Lab Results  Component Value Date   CHOL 149 07/24/2017   HDL 40 (L) 07/24/2017   LDLCALC 98 07/24/2017   TRIG 55 07/24/2017   No results found for: DDIMER  Radiology/Studies:  Ct Head Wo Contrast  Result Date: 07/24/2017 CLINICAL DATA:  Dizziness times 2-3 weeks. EXAM: CT HEAD WITHOUT CONTRAST TECHNIQUE: Contiguous axial images were obtained from the base of the skull through the vertex without intravenous contrast. COMPARISON:  None. FINDINGS: BRAIN: The ventricles and sulci are normal for age. No intraparenchymal hemorrhage, mass effect nor midline shift. No acute large vascular territory infarcts. Grey-white matter distinction is maintained. The basal ganglia are unremarkable. No abnormal extra-axial fluid collections. Basal cisterns are not effaced and midline. The brainstem and cerebellar hemispheres are without acute abnormalities. VASCULAR: Unremarkable. SKULL/SOFT TISSUES: No skull fracture. No  significant soft tissue swelling. ORBITS/SINUSES: The included ocular globes and orbital contents are normal.The mastoid air cells are clear. The included paranasal sinuses are well-aerated. OTHER: None. IMPRESSION: Normal head CT. Electronically Signed   By: Tollie Eth M.D.   On: 07/24/2017 18:49   Dg Foot Complete Right  Result Date: 07/24/2017 CLINICAL DATA:  Weight fell on second toe. EXAM: RIGHT FOOT COMPLETE - 3+ VIEW COMPARISON:  None. FINDINGS: There is no evidence of fracture or dislocation. There is no evidence of arthropathy or other focal bone abnormality. Relative soft tissue swelling of the second toe. IMPRESSION: No acute fracture or joint dislocations. Soft tissue swelling of the proximal second toe. Electronically Signed   By: Tollie Eth M.D.   On:  07/24/2017 21:41    EKG: Normal sinus rhythm with left axis deviation left atrial enlargement left ventricular hypertrophy with anterior precordial T wave inversion consistent with myocardial infarction  Weights: Filed Weights   07/24/17 1820 07/24/17 2352  Weight: 170 lb (77.1 kg) 162 lb 6.4 oz (73.7 kg)     Physical Exam: Blood pressure 135/62, pulse (!) 58, temperature 98.6 F (37 C), resp. rate 18, height 5\' 7"  (1.702 m), weight 162 lb 6.4 oz (73.7 kg), SpO2 97 %. Body mass index is 25.44 kg/m. General: Well developed, well nourished, in no acute distress. Head eyes ears nose throat: Normocephalic, atraumatic, sclera non-icteric, no xanthomas, nares are without discharge. No apparent thyromegaly and/or mass  Lungs: Normal respiratory effort.  no wheezes, no rales, no rhonchi.  Heart: RRR with normal S1 S2. no murmur gallop, no rub, PMI is normal size and placement, carotid upstroke normal without bruit, jugular venous pressure is normal Abdomen: Soft, non-tender, non-distended with normoactive bowel sounds. No hepatomegaly. No rebound/guarding. No obvious abdominal masses. Abdominal aorta is normal size without  bruit Extremities: No edema. no cyanosis, no clubbing, no ulcers  Peripheral : 2+ bilateral upper extremity pulses, 2+ bilateral femoral pulses, 2+ bilateral dorsal pedal pulse Neuro: Alert and oriented. No facial asymmetry. No focal deficit. Moves all extremities spontaneously. Musculoskeletal: Normal muscle tone without kyphosis Psych:  Responds to questions appropriately with a normal affect.    Assessment: 67 year old male with diabetes hypertension hyperlipidemia not perfectly treated with symptoms possibly consistent with non-ST elevation myocardial infarction  Plan: 1.  Continue heparin for further risk reduction of myocardial infarction 2.  Proceed to cardiac catheterization to assess coronary anatomy and further treatment thereof is necessary.  Patient understands risk and benefits of cardiac catheterization.  This includes a possibility of death stroke heart attack infection bleeding or blood clot.  He is at low risk for conscious sedation 3.  Continue metoprolol and aspirin and potentially high intensity cholesterol therapy for cardiovascular risk reduction  Signed, Lamar Blinks M.D. [facility] Cardiology 07/25/2017, 7:12 AM

## 2017-07-25 NOTE — Progress Notes (Signed)
Pt off the floor for cath

## 2017-07-25 NOTE — Progress Notes (Signed)
Judieth Keens, RN called and spoke with Dr. Cherlynn Kaiser re: pt. C/o lower back pain unrelieved by morphine. Pt. States "it still hurts pretty bad." MD to evaluate on her pt. Rounds with pt.

## 2017-07-25 NOTE — Progress Notes (Addendum)
Removed 10 more cc of air, 20 cc left in PAD. No bleeding noted, will continue to monitor.

## 2017-07-25 NOTE — Progress Notes (Signed)
10 cc of air removed from PAD, no bleeding noted. Will continue to monitor.

## 2017-07-25 NOTE — Progress Notes (Signed)
Patient complaining of sob, placed on 2 L via N/C for comfort will continue to monitor.

## 2017-07-26 LAB — HEMOGLOBIN A1C
HEMOGLOBIN A1C: 5.6 % (ref 4.8–5.6)
Mean Plasma Glucose: 114.02 mg/dL

## 2017-07-26 LAB — HIV ANTIBODY (ROUTINE TESTING W REFLEX): HIV Screen 4th Generation wRfx: NONREACTIVE

## 2017-07-26 MED ORDER — LISINOPRIL 5 MG PO TABS
5.0000 mg | ORAL_TABLET | Freq: Every day | ORAL | Status: DC
  Administered 2017-07-26: 5 mg via ORAL
  Filled 2017-07-26: qty 1

## 2017-07-26 MED ORDER — CLOPIDOGREL BISULFATE 75 MG PO TABS: 75.0000 mg | ORAL_TABLET | Freq: Every day | ORAL | 1 refills | Status: AC

## 2017-07-26 MED ORDER — METOPROLOL SUCCINATE ER 25 MG PO TB24: 25.0000 mg | ORAL_TABLET | Freq: Every day | ORAL | 1 refills | Status: DC

## 2017-07-26 MED ORDER — LISINOPRIL 10 MG PO TABS: 10.0000 mg | ORAL_TABLET | Freq: Every day | ORAL | 1 refills | Status: DC

## 2017-07-26 MED ORDER — NITROGLYCERIN 0.4 MG SL SUBL: 0.4000 mg | SUBLINGUAL_TABLET | SUBLINGUAL | 0 refills | Status: DC | PRN

## 2017-07-26 MED ORDER — ASPIRIN 81 MG PO CHEW: 81.0000 mg | CHEWABLE_TABLET | Freq: Every day | ORAL | 1 refills | Status: DC

## 2017-07-26 MED ORDER — METOPROLOL TARTRATE 25 MG PO TABS
12.5000 mg | ORAL_TABLET | Freq: Once | ORAL | Status: AC
  Administered 2017-07-26: 12.5 mg via ORAL

## 2017-07-26 MED ORDER — ATORVASTATIN CALCIUM 80 MG PO TABS: 80.0000 mg | ORAL_TABLET | Freq: Every day | ORAL | 1 refills | Status: DC

## 2017-07-26 NOTE — Progress Notes (Signed)
Travis Maxwell to be D/C'd Home per MD order.  Discussed prescriptions and follow up appointments with the patient. Prescriptions given to patient, medication list explained in detail. Grandaughter at bedside. Vascular instructions provided and provided education on the importance of taking his medicines and f/u with pcp.Pt verbalized understanding.   Allergies as of 07/26/2017      Reactions   Penicillins Other (See Comments)   Has patient had a PCN reaction causing immediate rash, facial/tongue/throat swelling, SOB or lightheadedness with hypotension: Unknown Has patient had a PCN reaction causing severe rash involving mucus membranes or skin necrosis: Unknown Has patient had a PCN reaction that required hospitalization: Unknown Has patient had a PCN reaction occurring within the last 10 years: No If all of the above answers are "NO", then may proceed with Cephalosporin use.      Medication List    TAKE these medications   aspirin 81 MG chewable tablet Chew 1 tablet (81 mg total) by mouth daily. Start taking on:  07/27/2017   atorvastatin 80 MG tablet Commonly known as:  LIPITOR Take 1 tablet (80 mg total) by mouth daily at 6 PM.   clopidogrel 75 MG tablet Commonly known as:  PLAVIX Take 1 tablet (75 mg total) by mouth daily.   lisinopril 10 MG tablet Commonly known as:  PRINIVIL,ZESTRIL Take 1 tablet (10 mg total) by mouth daily.   metoprolol succinate 25 MG 24 hr tablet Commonly known as:  TOPROL XL Take 1 tablet (25 mg total) by mouth daily.   nitroGLYCERIN 0.4 MG SL tablet Commonly known as:  NITROSTAT Place 1 tablet (0.4 mg total) under the tongue every 5 (five) minutes as needed for chest pain.       Vitals:   07/26/17 1331 07/26/17 1515  BP: (!) 168/76 (!) 146/71  Pulse: 72 60  Resp: 18 18  Temp:  98.9 F (37.2 C)  SpO2: 100% 98%    Tele box removed and returned. Skin clean, dry and intact without evidence of skin break down, no evidence of skin tears noted.  IV catheter discontinued intact. Site without signs and symptoms of complications. Dressing and pressure applied. Pt denies pain at this time. No complaints noted.  An After Visit Summary was printed and given to the patient. Patient escorted via WC, and D/C home via private auto.  Rigoberto Noel

## 2017-07-26 NOTE — Progress Notes (Addendum)
Cardiovascular and Pulmonary Nurse Navigator Note:  67 year old male with HTN, HLD, and non-compliance.  Patient admitted with dx of NSTEMI.  Patient underwent cardiac cath which revealed mild to moderate LV systolic dysfunction with inferior hypokinesis and significant 99% stenosis of mild proximal RCA.  Successfully treated with stent placement.  Patient to discharge home on metoprolol, ASA, Brilinta, Atorvastatin and Ace Inhibitor.  Echo performed on 07/25/2017 revealed EF of 25-30%.  Patient lying in bed watching TV.  No family members or visitors in the room.    "Heart Attack Bouncing Back" booklet given and reviewed with patient. Discussed the definition of CAD. Reviewed the location of CAD and where his stent was placed. Informed patient he will be given a stent card. Explained the purpose of the stent card. Instructed patient to keep stent card in his wallet.  ? Discussed modifiable risk factors including controlling blood pressure, cholesterol, and blood sugar; following heart healthy diet; maintaining healthy weight; exercise; and smoking cessation.  ? Discussed cardiac medications including rationale for taking, mechanisms of action, and side effects. Stressed the importance of taking medications as prescribed.  ? This RN asked patient if he has pharmacy coverage to pay for his medications?  Patient replied, "I usually get my medications for $3."  Patient could not name the pharmacy.   ? Smoking Cessation - Patient is a current every day smoker. "Thinking about Quitting - Yes You Can" informational sheet provided to patient.    Diet Consultation for diet education entered.  .  ? Exercise - Benefits of exercised discussed. 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  Explained to patient he had been referred to Cardiac Rehab.  Overview of the program briefly discussed.    Patient then asked this RN, "Where do you go to live?" This RN asked the patient where he had been living and his  response was, "Here and there."  This RN then asked him where his clothes and belongings were now? Patient replied, "In my truck.  My daughter has my truck."  This RN asked if he had ever been to a shelter. Patient responded, "I do not like shelters, but I may have to go to one if I cannot find another place to live."  This RN asked patient if he works.  Patient responded, "I mow yards."  Patient also mentioned an upcoming court date in June.  When questioned, patient was not real clear on what the court date is regarding, but stated, "They did not find drugs in the car."    This RN discussed the above with CM RN and Social Work.  CM and SW to see patient.  Note:  Patient's Brilinta will need to be changed to a more cost effective medication, as patient does not have Pharmacy coverage.  CM RN handling medication needs.    Follow-up: Patient to follow up with Dr. Gwen Pounds on 08/04/2017 at 12:45 p.m.    Patient has an appointment with new PCP at [facility] on 6/4/20119 at 11:00 a.m.    Addendum:  Patient expressing desire to participate in Cardiac Rehab.  Appointment for Cardiac Rehab orientation scheduled for Thursday, Aug 03, 2017 at 11:30 a.m. Complete orientation packet given to patient.  Patient instructed to complete orientation packet prior to presenting for orientation appointment.  Patient verbalized understanding.   ? Army Melia, RN, BSN, [facility]    [facility] Cardiac & Pulmonary Rehab  Cardiovascular & Pulmonary Nurse Navigator  Direct Line:  [phone]  Department Phone #: [phone] Fax: [phone]  Email Address: [email or web address]      .

## 2017-07-26 NOTE — Progress Notes (Signed)
[facility] Cardiology [facility] Encounter Note  Patient: Travis Maxwell / Admit Date: 07/24/2017 / Date of Encounter: 07/26/2017, 8:08 AM   Subjective: No evidence of chest pain.  Patient is improving clinically with no evidence of weakness fatigue or dizziness although a troponin level elevated consistent with non-ST elevation myocardial infarction Cardiac catheterization shows inferior hypokinesis with ejection fraction of 40% Left coronary artery system normal with 99% proximal right coronary artery stenosis  Review of Systems: Positive for: None Negative for: Vision change, hearing change, syncope, dizziness, nausea, vomiting,diarrhea, bloody stool, stomach pain, cough, congestion, diaphoresis, urinary frequency, urinary pain,skin lesions, skin rashes Others previously listed  Objective: Telemetry: Normal sinus rhythm Physical Exam: Blood pressure (!) 146/67, pulse (!) 57, temperature 98.2 F (36.8 C), resp. rate 18, height 5\' 7"  (1.702 m), weight 162 lb 6.4 oz (73.7 kg), SpO2 97 %. Body mass index is 25.44 kg/m. General: Well developed, well nourished, in no acute distress. Head: Normocephalic, atraumatic, sclera non-icteric, no xanthomas, nares are without discharge. Neck: No apparent masses Lungs: Normal respirations with no wheezes, no rhonchi, no rales , no crackles   Heart: Regular rate and rhythm, normal S1 S2, no murmur, no rub, no gallop, PMI is normal size and placement, carotid upstroke normal without bruit, jugular venous pressure normal Abdomen: Soft, non-tender, non-distended with normoactive bowel sounds. No hepatosplenomegaly. Abdominal aorta is normal size without bruit Extremities: No edema, no clubbing, no cyanosis, no ulcers,  Peripheral: 2+ radial, 2+ femoral, 2+ dorsal pedal pulses Neuro: Alert and oriented. Moves all extremities spontaneously. Psych:  Responds to questions appropriately with a normal affect.   Intake/Output Summary (Last 24 hours) at  07/26/2017 0808 Last data filed at 07/26/2017 0700 Gross per 24 hour  Intake 389.86 ml  Output 750 ml  Net -360.14 ml    Inpatient Medications:  . aspirin  81 mg Oral Daily  . atorvastatin  80 mg Oral q1800  . metoprolol tartrate  25 mg Oral BID  . sodium chloride flush  3 mL Intravenous Q12H  . ticagrelor  90 mg Oral BID   Infusions:  . sodium chloride      Labs: Recent Labs    07/24/17 1836 07/25/17 0501  NA 138 138  K 3.6 3.8  CL 108 108  CO2 22 23  GLUCOSE 118* 122*  BUN 16 14  CREATININE 0.81 0.85  CALCIUM 9.2 8.9   Recent Labs    07/24/17 1836  AST 24  ALT 18  ALKPHOS 69  BILITOT 1.1  PROT 8.1  ALBUMIN 4.1   Recent Labs    07/24/17 1836 07/25/17 0501  WBC 8.2 8.2  HGB 18.0 18.3*  HCT 55.2* 54.5*  MCV 82.2 82.7  PLT 313 305   Recent Labs    07/24/17 1836 07/24/17 2252 07/25/17 0126 07/25/17 0501  TROPONINI 0.77* 0.85* 0.91* 0.76*   Invalid input(s): POCBNP Recent Labs    07/24/17 2252  HGBA1C 5.7*     Weights: Filed Weights   07/24/17 1820 07/24/17 2352  Weight: 170 lb (77.1 kg) 162 lb 6.4 oz (73.7 kg)     Radiology/Studies:  Ct Head Wo Contrast  Result Date: 07/24/2017 CLINICAL DATA:  Dizziness times 2-3 weeks. EXAM: CT HEAD WITHOUT CONTRAST TECHNIQUE: Contiguous axial images were obtained from the base of the skull through the vertex without intravenous contrast. COMPARISON:  None. FINDINGS: BRAIN: The ventricles and sulci are normal for age. No intraparenchymal hemorrhage, mass effect nor midline shift. No acute large vascular territory infarcts.  Grey-white matter distinction is maintained. The basal ganglia are unremarkable. No abnormal extra-axial fluid collections. Basal cisterns are not effaced and midline. The brainstem and cerebellar hemispheres are without acute abnormalities. VASCULAR: Unremarkable. SKULL/SOFT TISSUES: No skull fracture. No significant soft tissue swelling. ORBITS/SINUSES: The included ocular globes and  orbital contents are normal.The mastoid air cells are clear. The included paranasal sinuses are well-aerated. OTHER: None. IMPRESSION: Normal head CT. Electronically Signed   By: Tollie Eth M.D.   On: 07/24/2017 18:49   Dg Foot Complete Right  Result Date: 07/24/2017 CLINICAL DATA:  Weight fell on second toe. EXAM: RIGHT FOOT COMPLETE - 3+ VIEW COMPARISON:  None. FINDINGS: There is no evidence of fracture or dislocation. There is no evidence of arthropathy or other focal bone abnormality. Relative soft tissue swelling of the second toe. IMPRESSION: No acute fracture or joint dislocations. Soft tissue swelling of the proximal second toe. Electronically Signed   By: Tollie Eth M.D.   On: 07/24/2017 21:41     Assessment and Recommendation  67 y.o. male with essential hypertension mixed hyperlipidemia and noncompliance of medication management with a non-ST elevation myocardial infarction Via catheterization showing mild to moderate LV systolic dysfunction with inferior hypokinesis and significant 99% stenosis of mild proximal right coronary artery now with successful PCI and stent placement 1.  Metoprolol and addition of ACE inhibitor as able for LV systolic dysfunction and myocardial infarction 2.  High intensity cholesterol therapy 3.  Dual antiplatelet therapy 4.  Cardiac rehabilitation 5.  Begin ambulation and follow for improvements of symptoms and okay for discharged home with follow-up next week   Signed, Arnoldo Hooker M.D. FACC

## 2017-07-26 NOTE — Progress Notes (Signed)
SATURATION QUALIFICATIONS: (This note is used to comply with regulatory documentation for home oxygen)  Patient Saturations on Room Air at Rest = 93%  Patient Saturations on Room Air while Ambulating = 95%  Patient Saturations on n/a Liters of oxygen while Ambulating = n/a%  Please briefly explain why patient needs home oxygen:

## 2017-07-26 NOTE — Clinical Social Work Note (Signed)
CSW was informed that patient says he is homeless and has been living out of his truck.  CSW spoke to patient and provided him contact information and addresses for DSS, [facility], [phone], the homeless shelter, and other resources in [place].  Case manager is helping patient with trying to afford his medications and get established with a PCP.  CSW to sign off, please reconsult if other social work needs arise.  Travis Maxwell. Travis Maxwell, MSW, Travis Maxwell [phone]  07/26/2017 2:06 PM

## 2017-07-26 NOTE — Progress Notes (Signed)
Pt's bp is 168/76. HR 72. Dr. Elisabeth Pigeon notified. Orders received for metoprolol 12.5 mg will administer and continue to monitor.

## 2017-07-26 NOTE — Care Management (Signed)
Patient admitted for NSTEMI.  Patient states that he has been living in [place] for 4 years.  Patient states that he stays with a "friend".  Patient states he is pretty sure he can go back to his friends house after discharge.  RNCM asked patient at 12 pm to call his "friend" to confirm he can stay here.  Patient agreed.  Patient's granddaughter at the bedside, and states that the patient's daughter will be coming up and they will have a plan for where he goes. CSE met with patient and providing him information on homeless shelter.  Update:  At 1500 RNCM followed up with patient.  Patient has yet to attempt to call his "friend".  RNCM informed patient that he would have to call as he has been discharged.  Granddaughter is asleep at bedside. She briefly woke up to call her mother, who stated she was on the way to the hospital.   Patient to discharge on nitro, metoprolol, lisinopril, atorvastatin, and Plavix.  Patient does not have Medicare Part D coverage. RNCM confirmed with Medication Management that they will be able to fill nitro, metoprolol, lisinopril, and atorvastatin.  Scripts faxed.  Granddaughter to pick up today before they close today at 4:30 pm.  Patient to pick up plavix at [facility] for $9 (no coupon needed).  Patient states that he draws $850 a month, and will be able to afford this medication. All of this information was provided in writing to patient and granddaughter.    Patient has a follow up appointment [facility] with [facility].  Patient is aware that he will have to follow up with his new PCP, and complete Medication Management  Application in order to continue to use them for assistance.  Patient provided with application to Medication Management . Bedside RN updated. RNCM signing off.

## 2017-07-26 NOTE — Plan of Care (Signed)
  Problem: Education: Goal: Understanding of cardiac disease, CV risk reduction, and recovery process will improve Outcome: Progressing   Problem: Activity: Goal: Ability to tolerate increased activity will improve Outcome: Progressing   Problem: Cardiac: Goal: Ability to achieve and maintain adequate cardiovascular perfusion will improve Outcome: Progressing   

## 2017-07-26 NOTE — Plan of Care (Signed)
Nutrition Education Note  RD consulted for nutrition education regarding a Heart Healthy diet.  Spoke with pt and 2 grandchildren at bedside. Pt states that he typically eats 2 meals daily that include vegetables (corn, potatoes, sweet potatoes), and either baked or fried chicken. Pt reports using "butter or grease" to cook food. Pt also uses the salt shaker when cooking and at the table. RD answered pt's and pt's grandchildren's questions.  Lipid Panel     Component Value Date/Time   CHOL 149 07/24/2017 2252   TRIG 55 07/24/2017 2252   HDL 40 (L) 07/24/2017 2252   CHOLHDL 3.7 07/24/2017 2252   VLDL 11 07/24/2017 2252   LDLCALC 98 07/24/2017 2252    RD provided "Heart Healthy Nutrition Therapy" handout from the Academy of [employer]. Reviewed patient's dietary recall. Provided examples on ways to decrease sodium and fat intake in diet. Discouraged intake of processed foods and use of salt shaker. Encouraged fresh fruits and vegetables as well as whole grain sources of carbohydrates to maximize fiber intake. Discussed role of fiber in heart health. Teach back method used.  Expect fair compliance.  Body mass index is 25.44 kg/m. Pt meets criteria for Overweight based on current BMI.  Current diet order is Heart Healthy, patient is consuming approximately 75% of meals at this time.  Labs and medications reviewed. No further nutrition interventions warranted at this time. If additional nutrition issues arise, please re-consult RD.  Earma Reading, MS, RD, LDN Pager: [phone] Weekend/After Hours: [phone]

## 2017-07-31 ENCOUNTER — Telehealth: Payer: Self-pay

## 2017-07-31 NOTE — Telephone Encounter (Signed)
EMMI Follow-up: Noted on the report that patient did not know who to contact if there was a change in condition or if he had follow-up appointments. Talked with Travis Maxwell and he said he had received his discharge paperwork but did not have it with him.  I reminded him of appointments and he wanted to know if I could call and remind him of each one.  I asked to review his discharge paperwork as it had his appointments listed on there.  Offered to call and leave a message on his VM with appointments but his VM had not been set up yet. He expressed everything was okay.  I let him know there would be another automated call with different questions and to let us know if he had any other concerns.

## 2017-08-03 ENCOUNTER — Encounter: Payer: Medicare Other | Attending: Internal Medicine

## 2017-08-03 NOTE — Discharge Summary (Signed)
[facility] [place] at [facility]   PATIENT NAME: Travis Maxwell    MR#:  [id number]  DATE OF BIRTH:  Jul 16, 1950  DATE OF ADMISSION:  07/24/2017 ADMITTING PHYSICIAN: Altamese Dilling, MD  DATE OF DISCHARGE: 07/26/2017  4:19 PM  PRIMARY CARE PHYSICIAN: System, Pcp Not In    ADMISSION DIAGNOSIS:  Dizziness [R42] Pain [R52] NSTEMI (non-ST elevated myocardial infarction) (HCC) [I21.4]  DISCHARGE DIAGNOSIS:  Principal Problem:   NSTEMI (non-ST elevated myocardial infarction) (HCC)   SECONDARY DIAGNOSIS:   Past Medical History:  Diagnosis Date  . Diabetes mellitus without complication (HCC)   . Gout   . Hypertension     HOSPITAL COURSE:   67 year old male with past medical history of diabetes, gout, hypertension but currently on no medications and has not seen a doctor in a while presented to the hospital due to dizziness and noted to have an elevated troponin.  1.  Non-ST elevation MI-patient ruled in by cardiac markers.  Seen by cardiology underwent a cardiac catheterization with a drug-eluting stent to the mid RCA. -Currently chest pain-free and stable.  Denies any dizziness. -Continue aspirin, Brilinta, metoprolol, start Atorvastatin.   2.  Hypertension-continue metoprolol.  3.  History of gout no acute attack.  X-ray of the right foot showed no acute pathology.  4.  Diabetes-patient's blood sugars are stable.  Patient was on any medications.  Checked hemoglobin A1c.     DISCHARGE CONDITIONS:   Stable.  CONSULTS OBTAINED:  Treatment Team:  Lamar Blinks, MD  DRUG ALLERGIES:   Allergies  Allergen Reactions  . Penicillins Other (See Comments)    Has patient had a PCN reaction causing immediate rash, facial/tongue/throat swelling, SOB or lightheadedness with hypotension: Unknown Has patient had a PCN reaction causing severe rash involving mucus membranes or skin necrosis: Unknown Has patient had a PCN reaction that  required hospitalization: Unknown Has patient had a PCN reaction occurring within the last 10 years: No If all of the above answers are "NO", then may proceed with Cephalosporin use.     DISCHARGE MEDICATIONS:   Allergies as of 07/26/2017      Reactions   Penicillins Other (See Comments)   Has patient had a PCN reaction causing immediate rash, facial/tongue/throat swelling, SOB or lightheadedness with hypotension: Unknown Has patient had a PCN reaction causing severe rash involving mucus membranes or skin necrosis: Unknown Has patient had a PCN reaction that required hospitalization: Unknown Has patient had a PCN reaction occurring within the last 10 years: No If all of the above answers are "NO", then may proceed with Cephalosporin use.      Medication List    TAKE these medications   aspirin 81 MG chewable tablet Chew 1 tablet (81 mg total) by mouth daily.   atorvastatin 80 MG tablet Commonly known as:  LIPITOR Take 1 tablet (80 mg total) by mouth daily at 6 PM.   clopidogrel 75 MG tablet Commonly known as:  PLAVIX Take 1 tablet (75 mg total) by mouth daily.   lisinopril 10 MG tablet Commonly known as:  PRINIVIL,ZESTRIL Take 1 tablet (10 mg total) by mouth daily.   metoprolol succinate 25 MG 24 hr tablet Commonly known as:  TOPROL XL Take 1 tablet (25 mg total) by mouth daily.   nitroGLYCERIN 0.4 MG SL tablet Commonly known as:  NITROSTAT Place 1 tablet (0.4 mg total) under the tongue every 5 (five) minutes as needed for chest pain.  DISCHARGE INSTRUCTIONS:    Follow with cardiology clinic.  If you experience worsening of your admission symptoms, develop shortness of breath, life threatening emergency, suicidal or homicidal thoughts you must seek medical attention immediately by calling 911 or calling your MD immediately  if symptoms less severe.  You Must read complete instructions/literature along with all the possible adverse reactions/side effects for  all the Medicines you take and that have been prescribed to you. Take any new Medicines after you have completely understood and accept all the possible adverse reactions/side effects.   Please note  You were cared for by a hospitalist during your hospital stay. If you have any questions about your discharge medications or the care you received while you were in the hospital after you are discharged, you can call the unit and asked to speak with the hospitalist on call if the hospitalist that took care of you is not available. Once you are discharged, your primary care physician will handle any further medical issues. Please note that NO REFILLS for any discharge medications will be authorized once you are discharged, as it is imperative that you return to your primary care physician (or establish a relationship with a primary care physician if you do not have one) for your aftercare needs so that they can reassess your need for medications and monitor your lab values.    Today   CHIEF COMPLAINT:   Chief Complaint  Patient presents with  . Dizziness    HISTORY OF PRESENT ILLNESS:  Travis Maxwell  is a 67 y.o. male with a known history of diabetes, hypertension, gout- was feeling dizzy today and so came to emergency room. He denies any associated chest pain, palpitation, shortness of breath. He denies any past history of coronary artery disease. Noted to have high troponin in ER, ER physician spoke to cardiologist and he suggested to start on heparin drip.  He had some weight fell on his right second toe and his toe is hurting.   VITAL SIGNS:  Blood pressure (!) 146/71, pulse 60, temperature 98.9 F (37.2 C), temperature source Oral, resp. rate 18, height 5\' 7"  (1.702 m), weight 73.7 kg (162 lb 6.4 oz), SpO2 98 %.  I/O:  No intake or output data in the 24 hours ending 08/03/17 2243  PHYSICAL EXAMINATION:  GENERAL:  67 y.o.-year-old patient lying in the bed with no acute distress.   EYES: Pupils equal, round, reactive to light and accommodation. No scleral icterus. Extraocular muscles intact.  HEENT: Head atraumatic, normocephalic. Oropharynx and nasopharynx clear.  NECK:  Supple, no jugular venous distention. No thyroid enlargement, no tenderness.  LUNGS: Normal breath sounds bilaterally, no wheezing, rales,rhonchi or crepitation. No use of accessory muscles of respiration.  CARDIOVASCULAR: S1, S2 normal. No murmurs, rubs, or gallops.  ABDOMEN: Soft, non-tender, non-distended. Bowel sounds present. No organomegaly or mass.  EXTREMITIES: No pedal edema, cyanosis, or clubbing.  NEUROLOGIC: Cranial nerves II through XII are intact. Muscle strength 5/5 in all extremities. Sensation intact. Gait not checked.  PSYCHIATRIC: The patient is alert and oriented x 3.  SKIN: No obvious rash, lesion, or ulcer.   DATA REVIEW:   CBC No results for input(s): WBC, HGB, HCT, PLT in the last 168 hours.  Chemistries  No results for input(s): NA, K, CL, CO2, GLUCOSE, BUN, CREATININE, CALCIUM, MG, AST, ALT, ALKPHOS, BILITOT in the last 168 hours.  Invalid input(s): GFRCGP  Cardiac Enzymes No results for input(s): TROPONINI in the last 168 hours.  Microbiology Results  No results found for this or any previous visit.  RADIOLOGY:  No results found.  EKG:   Orders placed or performed during the hospital encounter of 07/24/17  . ED EKG  . ED EKG  . EKG 12-Lead immediately post procedure  . EKG 12-Lead  . EKG 12-Lead immediately post procedure  . EKG 12-Lead  . EKG      Management plans discussed with the patient, family and they are in agreement.  CODE STATUS:  Code Status History    Date Active Date Inactive Code Status Order ID Comments User Context   07/25/2017 0127 07/26/2017 1919 Full Code [id number]  Altamese Dilling, MD Inpatient      TOTAL TIME TAKING CARE OF THIS PATIENT: 35 minutes.    Altamese Dilling M.D on 08/03/2017 at 10:43 PM  Between 7am  to 6pm - Pager - [phone]  After 6pm go to [email or web address] - password [email or web address]  [facility]  Office  [phone]  CC: Primary care physician; System, Pcp Not In   Note: This dictation was prepared with Dragon dictation along with smaller phrase technology. Any transcriptional errors that result from this process are unintentional.

## 2017-08-13 ENCOUNTER — Encounter: Payer: Self-pay | Admitting: Emergency Medicine

## 2017-08-13 ENCOUNTER — Other Ambulatory Visit: Payer: Self-pay

## 2017-08-13 ENCOUNTER — Emergency Department: Payer: Medicare Other

## 2017-08-13 ENCOUNTER — Emergency Department
Admission: EM | Admit: 2017-08-13 | Discharge: 2017-08-13 | Discharge status: Against Medical Advice | Payer: Medicare Other | Attending: Emergency Medicine | Admitting: Emergency Medicine

## 2017-08-13 DIAGNOSIS — Z79899 Other long term (current) drug therapy: Secondary | ICD-10-CM | POA: Diagnosis not present

## 2017-08-13 DIAGNOSIS — Z7982 Long term (current) use of aspirin: Secondary | ICD-10-CM | POA: Diagnosis not present

## 2017-08-13 DIAGNOSIS — R0602 Shortness of breath: Secondary | ICD-10-CM | POA: Diagnosis present

## 2017-08-13 DIAGNOSIS — Z87891 Personal history of nicotine dependence: Secondary | ICD-10-CM | POA: Insufficient documentation

## 2017-08-13 DIAGNOSIS — E119 Type 2 diabetes mellitus without complications: Secondary | ICD-10-CM | POA: Insufficient documentation

## 2017-08-13 DIAGNOSIS — I10 Essential (primary) hypertension: Secondary | ICD-10-CM | POA: Insufficient documentation

## 2017-08-13 LAB — CBC
HCT: 49.7 % (ref 40.0–52.0)
Hemoglobin: 17.1 g/dL (ref 13.0–18.0)
MCH: 28.2 pg (ref 26.0–34.0)
MCHC: 34.4 g/dL (ref 32.0–36.0)
MCV: 81.9 fL (ref 80.0–100.0)
PLATELETS: 509 10*3/uL — AB (ref 150–440)
RBC: 6.07 MIL/uL — AB (ref 4.40–5.90)
RDW: 16.4 % — AB (ref 11.5–14.5)
WBC: 9.4 10*3/uL (ref 3.8–10.6)

## 2017-08-13 LAB — BASIC METABOLIC PANEL
Anion gap: 7 (ref 5–15)
BUN: 12 mg/dL (ref 6–20)
CO2: 26 mmol/L (ref 22–32)
CREATININE: 0.87 mg/dL (ref 0.61–1.24)
Calcium: 9.1 mg/dL (ref 8.9–10.3)
Chloride: 106 mmol/L (ref 101–111)
GFR calc Af Amer: >60 mL/min (ref >60)
GFR calc non Af Amer: >60 mL/min (ref >60)
GLUCOSE: 90 mg/dL (ref 65–99)
Potassium: 4.1 mmol/L (ref 3.5–5.1)
Sodium: 139 mmol/L (ref 135–145)

## 2017-08-13 LAB — TROPONIN I: TROPONIN I: 0.04 ng/mL — AB (ref <0.03)

## 2017-08-13 NOTE — ED Notes (Signed)
Pt came out of room with no leads walking down hall demanding he is ready to leave. EDP aware. EDP at bedside speaking to pt. Pt is requesting to be able to leave.

## 2017-08-13 NOTE — ED Triage Notes (Signed)
C/O difficulty breathing in the mornings while laying down and sleeping x 2 days.  Denies DOE or chest pain.  Only c/o orthopnea.

## 2017-08-13 NOTE — ED Notes (Signed)
Date and time results received: 08/13/17 0929   Test: trop Critical Value: 0.04  Name of Provider Notified: Derrill Kay

## 2017-08-13 NOTE — ED Provider Notes (Signed)
[facility] Emergency Department Provider Note  ____________________________________________   I have reviewed the triage vital signs and the nursing notes.   HISTORY  Chief Complaint Shortness of Breath   History limited by: Not Limited   HPI Travis Maxwell is a 67 y.o. male who presents to the emergency department today because of concerns for shortness of breath.  He stated it started yesterday.  The time my exam however it was gone.  He states it has been worse when he was lying flat.  Has not been accompanied by any cough or chest pain.  Patient denies any fevers.  He states he does have a history of smoking however he quit yesterday.  Denies any acid reflux or heartburn type symptoms.    Per medical record review patient has a history of recent emergency department visit for dizziness.   Past Medical History:  Diagnosis Date  . Diabetes mellitus without complication (HCC)   . Gout   . Hypertension     Patient Active Problem List   Diagnosis Date Noted  . NSTEMI (non-ST elevated myocardial infarction) (HCC) 07/24/2017    Past Surgical History:  Procedure Laterality Date  . CORONARY STENT INTERVENTION N/A 07/25/2017   Procedure: CORONARY STENT INTERVENTION;  Surgeon: Alwyn Pea, MD;  Location: [facility] INVASIVE CV LAB;  Service: Cardiovascular;  Laterality: N/A;  . LEFT HEART CATH AND CORONARY ANGIOGRAPHY N/A 07/25/2017   Procedure: LEFT HEART CATH AND CORONARY ANGIOGRAPHY;  Surgeon: Lamar Blinks, MD;  Location: [facility] INVASIVE CV LAB;  Service: Cardiovascular;  Laterality: N/A;    Prior to Admission medications   Medication Sig Start Date End Date Taking? Authorizing Provider  aspirin 81 MG chewable tablet Chew 1 tablet (81 mg total) by mouth daily. 07/27/17   Altamese Dilling, MD  atorvastatin (LIPITOR) 80 MG tablet Take 1 tablet (80 mg total) by mouth daily at 6 PM. 07/26/17   Altamese Dilling, MD  clopidogrel (PLAVIX) 75 MG  tablet Take 1 tablet (75 mg total) by mouth daily. 07/26/17 07/26/18  Altamese Dilling, MD  lisinopril (PRINIVIL,ZESTRIL) 10 MG tablet Take 1 tablet (10 mg total) by mouth daily. 07/26/17 07/26/18  Altamese Dilling, MD  metoprolol succinate (TOPROL XL) 25 MG 24 hr tablet Take 1 tablet (25 mg total) by mouth daily. 07/26/17 07/26/18  Altamese Dilling, MD  nitroGLYCERIN (NITROSTAT) 0.4 MG SL tablet Place 1 tablet (0.4 mg total) under the tongue every 5 (five) minutes as needed for chest pain. 07/26/17   Altamese Dilling, MD    Allergies Penicillins  Family History  Problem Relation Age of Onset  . CAD Mother   . CAD Father     Social History Social History   Tobacco Use  . Smoking status: Former Smoker    Packs/day: 0.50    Types: Cigarettes    Last attempt to quit: 08/10/2017  . Smokeless tobacco: Never Used  Substance Use Topics  . Alcohol use: Yes    Comment: rare  . Drug use: No    Review of Systems Constitutional: No fever/chills Eyes: No visual changes. ENT: No sore throat. Cardiovascular: Denies chest pain. Respiratory: Positive for shortness of breath. Gastrointestinal: No abdominal pain.  No nausea, no vomiting.  No diarrhea.   Genitourinary: Negative for dysuria. Musculoskeletal: Negative for back pain. Skin: Negative for rash. Neurological: Negative for headaches, focal weakness or numbness.  ____________________________________________   PHYSICAL EXAM:  VITAL SIGNS: ED Triage Vitals  Enc Vitals Group     BP 08/13/17  1610 (!) 154/60     Pulse Rate 08/13/17 0823 (!) 55     Resp 08/13/17 0823 16     Temp 08/13/17 0823 99.2 F (37.3 C)     Temp Source 08/13/17 0823 Oral     SpO2 08/13/17 0823 98 %     Weight 08/13/17 0821 162 lb (73.5 kg)     Height 08/13/17 0821 5\' 7"  (1.702 m)     Head Circumference --      Peak Flow --      Pain Score 08/13/17 0821 0   Constitutional: Alert and oriented.  Eyes: Conjunctivae are normal.  ENT       Head: Normocephalic and atraumatic.      Nose: No congestion/rhinnorhea.      Mouth/Throat: Mucous membranes are moist.      Neck: No stridor. Hematological/Lymphatic/Immunilogical: No cervical lymphadenopathy. Cardiovascular: Normal rate, regular rhythm.  No murmurs, rubs, or gallops.  Respiratory: Normal respiratory effort without tachypnea nor retractions. Breath sounds are clear and equal bilaterally. No wheezes/rales/rhonchi. Gastrointestinal: Soft and non tender. No rebound. No guarding.  Genitourinary: Deferred Musculoskeletal: Normal range of motion in all extremities. No lower extremity edema. Neurologic:  Normal speech and language. No gross focal neurologic deficits are appreciated.  Skin:  Skin is warm, dry and intact. No rash noted. Psychiatric: Mood and affect are normal. Speech and behavior are normal. Patient exhibits appropriate insight and judgment.  ____________________________________________    LABS (pertinent positives/negatives)  Trop 0.04 BMP wnl CBC wbc 9.4, hgb 17.1, plt 509  ____________________________________________   EKG  I, Phineas Semen, attending physician, personally viewed and interpreted this EKG  EKG Time: 0830 Rate: 54 Rhythm: sinus bradycardia Axis: left axis deviation Intervals: qtc 474 QRS: RBBB ST changes: no st elevation Impression: abnormal ekg  ____________________________________________    RADIOLOGY  CXR No acute pulmonary abnormality. Right humerus lesion.  ____________________________________________   PROCEDURES  Procedures  ____________________________________________   INITIAL IMPRESSION / ASSESSMENT AND PLAN / ED COURSE  Pertinent labs & imaging results that were available during my care of the patient were reviewed by me and considered in my medical decision making (see chart for details).   Patient presented to the emergency department today because of concern for shortness of breath. Differential  would be broad including pneumonia, pneumothorax, PE, acs, copd amongst other etiologies. CXR without acute pulmonary findings. Patient's troponin minimally elevated at 0.04. Patient notified staff that he was ready to leave prior to my being able to discuss findings. Did have a change to briefly discuss elevated troponin with the patient. Discussed concern that it could signify heart damage and that we would like to repeat it. He chose to leave. Discussed possibility of heart attack. ________________________________________   FINAL CLINICAL IMPRESSION(S) / ED DIAGNOSES  Final diagnoses:  Shortness of breath     Note: This dictation was prepared with Dragon dictation. Any transcriptional errors that result from this process are unintentional     Phineas Semen, MD 08/13/17 1003

## 2017-08-13 NOTE — ED Notes (Signed)
Pt was informed on elevated labs and the need for a redraw, pt request time amount and it was stated approx 3 hours. Pt then stated no he was not staying he wanted to leave. Pt then signed AMA and left.

## 2017-08-14 ENCOUNTER — Other Ambulatory Visit: Payer: Self-pay

## 2017-08-14 ENCOUNTER — Emergency Department: Payer: Medicare Other

## 2017-08-14 ENCOUNTER — Emergency Department
Admission: EM | Admit: 2017-08-14 | Discharge: 2017-08-14 | Disposition: A | Discharge status: Home / Self Care | Payer: Medicare Other | Attending: Emergency Medicine | Admitting: Emergency Medicine

## 2017-08-14 DIAGNOSIS — R0602 Shortness of breath: Secondary | ICD-10-CM | POA: Diagnosis not present

## 2017-08-14 DIAGNOSIS — Z5321 Procedure and treatment not carried out due to patient leaving prior to being seen by health care provider: Secondary | ICD-10-CM | POA: Diagnosis not present

## 2017-08-14 LAB — COMPREHENSIVE METABOLIC PANEL
ALBUMIN: 3.5 g/dL (ref 3.5–5.0)
ALT: 20 U/L (ref 17–63)
AST: 19 U/L (ref 15–41)
Alkaline Phosphatase: 80 U/L (ref 38–126)
Anion gap: 10 (ref 5–15)
BUN: 11 mg/dL (ref 6–20)
CHLORIDE: 103 mmol/L (ref 101–111)
CO2: 24 mmol/L (ref 22–32)
Calcium: 9 mg/dL (ref 8.9–10.3)
Creatinine, Ser: 0.77 mg/dL (ref 0.61–1.24)
GFR calc Af Amer: >60 mL/min (ref >60)
GFR calc non Af Amer: >60 mL/min (ref >60)
GLUCOSE: 139 mg/dL — AB (ref 65–99)
POTASSIUM: 3.8 mmol/L (ref 3.5–5.1)
SODIUM: 137 mmol/L (ref 135–145)
Total Bilirubin: 1 mg/dL (ref 0.3–1.2)
Total Protein: 7.2 g/dL (ref 6.5–8.1)

## 2017-08-14 LAB — CBC WITH DIFFERENTIAL/PLATELET
BASOS ABS: 0.1 10*3/uL (ref 0–0.1)
Basophils Relative: 1 %
EOS PCT: 5 %
Eosinophils Absolute: 0.5 10*3/uL (ref 0–0.7)
HEMATOCRIT: 48.2 % (ref 40.0–52.0)
Hemoglobin: 15.9 g/dL (ref 13.0–18.0)
Lymphocytes Relative: 27 %
Lymphs Abs: 2.5 10*3/uL (ref 1.0–3.6)
MCH: 26.9 pg (ref 26.0–34.0)
MCHC: 33.1 g/dL (ref 32.0–36.0)
MCV: 81.4 fL (ref 80.0–100.0)
MONO ABS: 0.7 10*3/uL (ref 0.2–1.0)
Monocytes Relative: 8 %
NEUTROS ABS: 5.4 10*3/uL (ref 1.4–6.5)
Neutrophils Relative %: 59 %
PLATELETS: 459 10*3/uL — AB (ref 150–440)
RBC: 5.91 MIL/uL — ABNORMAL HIGH (ref 4.40–5.90)
RDW: 15.6 % — AB (ref 11.5–14.5)
WBC: 9.3 10*3/uL (ref 3.8–10.6)

## 2017-08-14 LAB — TROPONIN I
TROPONIN I: 0.04 ng/mL — AB (ref <0.03)
Troponin I: 0.04 ng/mL (ref <0.03)

## 2017-08-14 NOTE — ED Notes (Signed)
Pt walking down the hall and asked if he was leaving. Pt stated that "this is bullshit i'm not waiting anymore." Pt seen leaving.

## 2017-08-14 NOTE — ED Triage Notes (Signed)
Pt reports shortness of breath - pt came to ED yesterday but said the wait was to long and he left - pt states he was seen last week in the ED and admitted (pt poor historian but states that he had a heart attack and had to receive blood)

## 2017-08-14 NOTE — ED Notes (Signed)
Pt came to [facility] nurses station and demanded that IV be removed and stated "this is bull shit and I'm going home" - IV removed - pt was leaving and first nurse Selena Batten RN told him that she had a room for him and took him to ED room

## 2017-10-06 ENCOUNTER — Emergency Department
Admission: EM | Admit: 2017-10-06 | Discharge: 2017-10-06 | Disposition: A | Discharge status: Home / Self Care | Payer: No Typology Code available for payment source | Attending: Emergency Medicine | Admitting: Emergency Medicine

## 2017-10-06 ENCOUNTER — Encounter: Payer: Self-pay | Admitting: Emergency Medicine

## 2017-10-06 ENCOUNTER — Other Ambulatory Visit: Payer: Self-pay

## 2017-10-06 DIAGNOSIS — Y92410 Unspecified street and highway as the place of occurrence of the external cause: Secondary | ICD-10-CM | POA: Diagnosis not present

## 2017-10-06 DIAGNOSIS — Z7902 Long term (current) use of antithrombotics/antiplatelets: Secondary | ICD-10-CM | POA: Insufficient documentation

## 2017-10-06 DIAGNOSIS — Z87891 Personal history of nicotine dependence: Secondary | ICD-10-CM | POA: Insufficient documentation

## 2017-10-06 DIAGNOSIS — I10 Essential (primary) hypertension: Secondary | ICD-10-CM | POA: Diagnosis not present

## 2017-10-06 DIAGNOSIS — Y999 Unspecified external cause status: Secondary | ICD-10-CM | POA: Diagnosis not present

## 2017-10-06 DIAGNOSIS — Z7982 Long term (current) use of aspirin: Secondary | ICD-10-CM | POA: Diagnosis not present

## 2017-10-06 DIAGNOSIS — M545 Low back pain: Secondary | ICD-10-CM | POA: Insufficient documentation

## 2017-10-06 DIAGNOSIS — I252 Old myocardial infarction: Secondary | ICD-10-CM | POA: Diagnosis not present

## 2017-10-06 DIAGNOSIS — Y939 Activity, unspecified: Secondary | ICD-10-CM | POA: Insufficient documentation

## 2017-10-06 DIAGNOSIS — E119 Type 2 diabetes mellitus without complications: Secondary | ICD-10-CM | POA: Diagnosis not present

## 2017-10-06 MED ORDER — ACETAMINOPHEN 325 MG PO TABS
650.0000 mg | ORAL_TABLET | Freq: Once | ORAL | Status: AC
  Administered 2017-10-06: 650 mg via ORAL
  Filled 2017-10-06: qty 2
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
## 2017-10-06 NOTE — ED Notes (Signed)
Reviewed discharge instructions and follow-up care with patient. Patient verbalized understanding of all information reviewed. Patient stable, with no distress noted at this time.    
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
## 2018-08-06 ENCOUNTER — Emergency Department: Payer: Medicare Other

## 2018-08-06 ENCOUNTER — Other Ambulatory Visit: Payer: Self-pay

## 2018-08-06 ENCOUNTER — Emergency Department
Admission: EM | Admit: 2018-08-06 | Discharge: 2018-08-06 | Disposition: A | Discharge status: Home / Self Care | Payer: Medicare Other | Attending: Emergency Medicine | Admitting: Emergency Medicine

## 2018-08-06 DIAGNOSIS — Z79899 Other long term (current) drug therapy: Secondary | ICD-10-CM | POA: Insufficient documentation

## 2018-08-06 DIAGNOSIS — E119 Type 2 diabetes mellitus without complications: Secondary | ICD-10-CM | POA: Insufficient documentation

## 2018-08-06 DIAGNOSIS — I10 Essential (primary) hypertension: Secondary | ICD-10-CM | POA: Insufficient documentation

## 2018-08-06 DIAGNOSIS — F17210 Nicotine dependence, cigarettes, uncomplicated: Secondary | ICD-10-CM | POA: Diagnosis not present

## 2018-08-06 DIAGNOSIS — M25462 Effusion, left knee: Secondary | ICD-10-CM | POA: Diagnosis not present

## 2018-08-06 DIAGNOSIS — M25562 Pain in left knee: Secondary | ICD-10-CM | POA: Diagnosis present

## 2018-08-06 MED ORDER — TRAMADOL HCL 50 MG PO TABS: 50.0000 mg | ORAL_TABLET | Freq: Four times a day (QID) | ORAL | 0 refills | Status: DC | PRN

## 2018-08-06 MED ORDER — NAPROXEN 500 MG PO TABS: 500.0000 mg | ORAL_TABLET | Freq: Two times a day (BID) | ORAL | 0 refills | Status: DC

## 2018-08-06 NOTE — ED Notes (Signed)
Pt given blanket.

## 2018-08-06 NOTE — ED Provider Notes (Signed)
[facility] EMERGENCY DEPARTMENT Provider Note   CSN: [id number] Arrival date & time: 08/06/18  1419    History   Chief Complaint Chief Complaint  Patient presents with  . Knee Pain    HPI Travis Maxwell is a 68 y.o. male.     68 year old male presents to the emergency department for treatment and evaluation of left knee pain.  He slipped and fell 2 days ago.  The knee has been swollen and painful since then.  No alleviating measures attempted prior to arrival.     Past Medical History:  Diagnosis Date  . Diabetes mellitus without complication (HCC)   . Gout   . Hypertension     Patient Active Problem List   Diagnosis Date Noted  . NSTEMI (non-ST elevated myocardial infarction) (HCC) 07/24/2017    Past Surgical History:  Procedure Laterality Date  . CORONARY STENT INTERVENTION N/A 07/25/2017   Procedure: CORONARY STENT INTERVENTION;  Surgeon: Alwyn Pea, MD;  Location: [facility] INVASIVE CV LAB;  Service: Cardiovascular;  Laterality: N/A;  . LEFT HEART CATH AND CORONARY ANGIOGRAPHY N/A 07/25/2017   Procedure: LEFT HEART CATH AND CORONARY ANGIOGRAPHY;  Surgeon: Lamar Blinks, MD;  Location: [facility] INVASIVE CV LAB;  Service: Cardiovascular;  Laterality: N/A;        Home Medications    Prior to Admission medications   Medication Sig Start Date End Date Taking? Authorizing Provider  aspirin 81 MG chewable tablet Chew 1 tablet (81 mg total) by mouth daily. 07/27/17   Altamese Dilling, MD  atorvastatin (LIPITOR) 80 MG tablet Take 1 tablet (80 mg total) by mouth daily at 6 PM. 07/26/17   Altamese Dilling, MD  lisinopril (PRINIVIL,ZESTRIL) 10 MG tablet Take 1 tablet (10 mg total) by mouth daily. 07/26/17 07/26/18  Altamese Dilling, MD  metoprolol succinate (TOPROL XL) 25 MG 24 hr tablet Take 1 tablet (25 mg total) by mouth daily. 07/26/17 07/26/18  Altamese Dilling, MD  naproxen (NAPROSYN) 500 MG tablet Take 1 tablet (500 mg  total) by mouth 2 (two) times daily with a meal. 08/06/18   Shelley Cocke B, FNP  nitroGLYCERIN (NITROSTAT) 0.4 MG SL tablet Place 1 tablet (0.4 mg total) under the tongue every 5 (five) minutes as needed for chest pain. 07/26/17   Altamese Dilling, MD  traMADol (ULTRAM) 50 MG tablet Take 1 tablet (50 mg total) by mouth every 6 (six) hours as needed. 08/06/18   Chinita Pester, FNP    Family History Family History  Problem Relation Age of Onset  . CAD Mother   . CAD Father     Social History Social History   Tobacco Use  . Smoking status: Current Every Day Smoker    Packs/day: 0.50    Last attempt to quit: 08/10/2017    Years since quitting: 0.9  . Smokeless tobacco: Never Used  Substance Use Topics  . Alcohol use: Not Currently  . Drug use: No     Allergies   Penicillins   Review of Systems Review of Systems  Constitutional: Negative for fever.  HENT: Negative.   Eyes: Negative.   Respiratory: Negative.   Cardiovascular: Negative for chest pain and leg swelling.  Gastrointestinal: Negative for nausea and vomiting.  Musculoskeletal: Positive for arthralgias.  Skin: Negative for color change.  Neurological: Negative.      Physical Exam Updated Vital Signs BP 137/84 (BP Location: Right Arm)   Pulse (!) 51   Temp 98.4 F (36.9 C) (Oral)  Resp 17   Ht 5\' 5"  (1.651 m)   Wt 72.6 kg   SpO2 99%   BMI 26.63 kg/m   Physical Exam Constitutional:      Appearance: Normal appearance.  HENT:     Head: Normocephalic.     Mouth/Throat:     Mouth: Mucous membranes are moist.  Neck:     Musculoskeletal: Normal range of motion.  Cardiovascular:     Pulses: Normal pulses.  Pulmonary:     Effort: Pulmonary effort is normal.  Abdominal:     General: There is no distension.  Musculoskeletal:        General: Swelling present.     Left knee: He exhibits decreased range of motion, swelling and effusion. He exhibits no ecchymosis, no laceration and no erythema.  Tenderness found.     Comments: Left knee diffusely edematous with obvious effusion  Skin:    General: Skin is warm and dry.     Capillary Refill: Capillary refill takes less than 2 seconds.     Findings: No bruising, erythema or lesion.  Neurological:     Mental Status: He is alert and oriented to person, place, and time.      ED Treatments / Results  Labs (all labs ordered are listed, but only abnormal results are displayed) Labs Reviewed - No data to display  EKG None  Radiology Dg Knee Complete 4 Views Left  Result Date: 08/06/2018 CLINICAL DATA:  Slipped and injured knee 2 days ago with persistent pain and swelling. EXAM: LEFT KNEE - COMPLETE 4+ VIEW COMPARISON:  None. FINDINGS: Large knee joint effusion. No evidence of fracture, dislocation or pre-existing degenerative change. IMPRESSION: Large knee joint effusion.  No other finding. Electronically Signed   By: Paulina Fusi M.D.   On: 08/06/2018 15:33    Procedures Procedures (including critical care time)  Medications Ordered in ED Medications - No data to display   Initial Impression / Assessment and Plan / ED Course  I have reviewed the triage vital signs and the nursing notes.  Pertinent labs & imaging results that were available during my care of the patient were reviewed by me and considered in my medical decision making (see chart for details).        68 year old male presents to the emergency department 2 days post mechanical, non-syncopal fall.  Image of the left knee shows an obvious joint effusion.  He was placed in a knee immobilizer and advised to follow-up with orthopedics if not improving over the week.  He was advised to rest, ice, and elevate his leg as much possible over the next few days.  He was advised to return to the emergency department for symptoms change or worsen if he is unable to schedule an appointment with his primary care provider or with orthopedics.  Final Clinical Impressions(s) / ED  Diagnoses   Final diagnoses:  Knee effusion, left    ED Discharge Orders         Ordered    traMADol (ULTRAM) 50 MG tablet  Every 6 hours PRN     08/06/18 1625    naproxen (NAPROSYN) 500 MG tablet  2 times daily with meals     08/06/18 1625           Chinita Pester, FNP 08/06/18 1957    Arnaldo Natal, MD 08/06/18 2156

## 2018-08-06 NOTE — Discharge Instructions (Signed)
Follow up with the orthopedic doctor if not improving over the week.  Rest, ice, and elevate your leg as often as possible for the next few days.  Return to the ER for symptoms that change or worsen if unable to schedule an appointment.

## 2018-08-06 NOTE — ED Triage Notes (Signed)
Pt states he slipped on the stairs 2 day ago and injured his left knee

## 2018-09-26 ENCOUNTER — Other Ambulatory Visit: Payer: Self-pay

## 2018-09-26 ENCOUNTER — Emergency Department
Admission: EM | Admit: 2018-09-26 | Discharge: 2018-09-26 | Disposition: A | Discharge status: Home / Self Care | Payer: Medicare Other | Attending: Emergency Medicine | Admitting: Emergency Medicine

## 2018-09-26 ENCOUNTER — Encounter: Payer: Self-pay | Admitting: Emergency Medicine

## 2018-09-26 DIAGNOSIS — E119 Type 2 diabetes mellitus without complications: Secondary | ICD-10-CM | POA: Diagnosis not present

## 2018-09-26 DIAGNOSIS — R0602 Shortness of breath: Secondary | ICD-10-CM | POA: Diagnosis not present

## 2018-09-26 DIAGNOSIS — Z20828 Contact with and (suspected) exposure to other viral communicable diseases: Secondary | ICD-10-CM | POA: Diagnosis not present

## 2018-09-26 DIAGNOSIS — Z79899 Other long term (current) drug therapy: Secondary | ICD-10-CM | POA: Insufficient documentation

## 2018-09-26 DIAGNOSIS — R079 Chest pain, unspecified: Secondary | ICD-10-CM | POA: Diagnosis present

## 2018-09-26 DIAGNOSIS — Z7982 Long term (current) use of aspirin: Secondary | ICD-10-CM | POA: Insufficient documentation

## 2018-09-26 DIAGNOSIS — R630 Anorexia: Secondary | ICD-10-CM | POA: Insufficient documentation

## 2018-09-26 DIAGNOSIS — R42 Dizziness and giddiness: Secondary | ICD-10-CM | POA: Insufficient documentation

## 2018-09-26 DIAGNOSIS — R072 Precordial pain: Secondary | ICD-10-CM

## 2018-09-26 DIAGNOSIS — F17210 Nicotine dependence, cigarettes, uncomplicated: Secondary | ICD-10-CM | POA: Insufficient documentation

## 2018-09-26 DIAGNOSIS — I10 Essential (primary) hypertension: Secondary | ICD-10-CM | POA: Insufficient documentation

## 2018-09-26 DIAGNOSIS — I200 Unstable angina: Secondary | ICD-10-CM | POA: Insufficient documentation

## 2018-09-26 DIAGNOSIS — Z7901 Long term (current) use of anticoagulants: Secondary | ICD-10-CM | POA: Diagnosis not present

## 2018-09-26 DIAGNOSIS — I252 Old myocardial infarction: Secondary | ICD-10-CM | POA: Diagnosis not present

## 2018-09-26 LAB — BASIC METABOLIC PANEL
Anion gap: 10 (ref 5–15)
BUN: 18 mg/dL (ref 8–23)
CO2: 21 mmol/L — ABNORMAL LOW (ref 22–32)
Calcium: 9.5 mg/dL (ref 8.9–10.3)
Chloride: 106 mmol/L (ref 98–111)
Creatinine, Ser: 1.06 mg/dL (ref 0.61–1.24)
GFR calc Af Amer: >60 mL/min (ref >60)
GFR calc non Af Amer: >60 mL/min (ref >60)
Glucose, Bld: 171 mg/dL — ABNORMAL HIGH (ref 70–99)
Potassium: 3.6 mmol/L (ref 3.5–5.1)
Sodium: 137 mmol/L (ref 135–145)

## 2018-09-26 LAB — HEPARIN LEVEL (UNFRACTIONATED): Heparin Unfractionated: 0.26 IU/mL — ABNORMAL LOW (ref 0.30–0.70)

## 2018-09-26 LAB — CBC
HCT: 48.9 % (ref 39.0–52.0)
Hemoglobin: 15.1 g/dL (ref 13.0–17.0)
MCH: 21.1 pg — ABNORMAL LOW (ref 26.0–34.0)
MCHC: 30.9 g/dL (ref 30.0–36.0)
MCV: 68.4 fL — ABNORMAL LOW (ref 80.0–100.0)
Platelets: 442 10*3/uL — ABNORMAL HIGH (ref 150–400)
RBC: 7.15 MIL/uL — ABNORMAL HIGH (ref 4.22–5.81)
RDW: 22.9 % — ABNORMAL HIGH (ref 11.5–15.5)
WBC: 10.7 10*3/uL — ABNORMAL HIGH (ref 4.0–10.5)
nRBC: 0 % (ref 0.0–0.2)

## 2018-09-26 LAB — TROPONIN I (HIGH SENSITIVITY)
Troponin I (High Sensitivity): 61 ng/L — ABNORMAL HIGH (ref <18)
Troponin I (High Sensitivity): 58 ng/L — ABNORMAL HIGH (ref <18)

## 2018-09-26 LAB — APTT: aPTT: 28 seconds (ref 24–36)

## 2018-09-26 LAB — PROTIME-INR
INR: 1.1 (ref 0.8–1.2)
Prothrombin Time: 14.5 seconds (ref 11.4–15.2)

## 2018-09-26 LAB — SARS CORONAVIRUS 2 BY RT PCR (HOSPITAL ORDER, PERFORMED IN ~~LOC~~ HOSPITAL LAB): SARS Coronavirus 2: NEGATIVE

## 2018-09-26 MED ORDER — ASPIRIN EC 81 MG PO TBEC: 81.0000 mg | DELAYED_RELEASE_TABLET | Freq: Every day | ORAL | 2 refills | Status: DC

## 2018-09-26 MED ORDER — ASPIRIN 81 MG PO CHEW
324.0000 mg | CHEWABLE_TABLET | Freq: Once | ORAL | Status: AC
  Administered 2018-09-26: 324 mg via ORAL
  Filled 2018-09-26: qty 4

## 2018-09-26 MED ORDER — NITROGLYCERIN 0.4 MG SL SUBL: 0.4000 mg | SUBLINGUAL_TABLET | SUBLINGUAL | 0 refills | Status: DC | PRN

## 2018-09-26 MED ORDER — HEPARIN (PORCINE) 25000 UT/250ML-% IV SOLN
800.0000 [IU]/h | INTRAVENOUS | Status: DC
  Administered 2018-09-26: 800 [IU]/h via INTRAVENOUS
  Filled 2018-09-26: qty 250

## 2018-09-26 NOTE — Progress Notes (Signed)
Patient feels well denies any chest pain, wants to go home and does not want to be admitted.  Discussed this with ER doctor Dr. Joni Fears will discharge the patient.

## 2018-09-26 NOTE — Consult Note (Signed)
ANTICOAGULATION CONSULT NOTE - Follow Up Consult  Pharmacy Consult for Heparin dosing  Indication: ACS / STEMI  Allergies  Allergen Reactions  . Penicillins Other (See Comments)    Has patient had a PCN reaction causing immediate rash, facial/tongue/throat swelling, SOB or lightheadedness with hypotension: Unknown Has patient had a PCN reaction causing severe rash involving mucus membranes or skin necrosis: Unknown Has patient had a PCN reaction that required hospitalization: Unknown Has patient had a PCN reaction occurring within the last 10 years: No If all of the above answers are "NO", then may proceed with Cephalosporin use.     Patient Measurements: Height: 5\' 5"  (165.1 cm) Weight: 150 lb (68 kg) IBW/kg (Calculated) : 61.5 Heparin Dosing Weight: 68 kg   Vital Signs: Temp: 98.6 F (37 C) (07/22 1305) Temp Source: Oral (07/22 1305) BP: 110/63 (07/22 1307) Pulse Rate: 81 (07/22 1305)  Labs: No results for input(s): HGB, HCT, PLT, APTT, LABPROT, INR, HEPARINUNFRC, HEPRLOWMOCWT, CREATININE, CKTOTAL, CKMB, TROPONINIHS in the last 72 hours.  CrCl cannot be calculated (Patient's most recent lab result is older than the maximum 21 days allowed.).   Medications:  Aspirin Per Care Everywhere- patient was on Eliquis- pharmacy technician to confirmed the last dose 09/25/18 @ ~ 1300-1700 ("took medication yesterday around this time).   Assessment: Patient presented with complaints of chest pain. Troponin elevated. Once the aPTT and HL correlate, will use HL for dosing.   Goal of Therapy:  Heparin level 0.3-0.7 units/ml aPTT 66-102 seconds Monitor platelets by anticoagulation protocol: Yes   Plan:  Baseline labs have been ordered  Heparin DW: 68 kg  Start heparin infusion at 800 units/hr Check anti-Xa level/aPTT in 6 hours and daily while on heparin, per protocol Continue to monitor H&H and platelets   Travis Maxwell R Travis Maxwell 09/26/2018,1:42 PM

## 2018-09-26 NOTE — Discharge Instructions (Addendum)
Please seek medical attention for any high fevers, chest pain, shortness of breath, change in behavior, persistent vomiting, bloody stool or any other new or concerning symptoms.  

## 2018-09-26 NOTE — ED Triage Notes (Signed)
Pt arrives with complaints of chest pain for the last 2 days. Pt states the pain is in the center of his chest and feels like a pounding pressure. Pt reports today he feels dizzy.

## 2018-09-26 NOTE — ED Provider Notes (Signed)
[facility] Emergency Department Provider Note  ____________________________________________  Time seen: Approximately 2:09 PM  I have reviewed the triage vital signs and the nursing notes.   HISTORY  Chief Complaint Chest Pain and Dizziness    HPI Travis Maxwell is a 68 y.o. male with a history of hypertension, gout, non-STEMI and recent aortic valve replacement in [place] who states that he has been having worsening chest pain for the past 2 days.  It is intermittent lasting for a few minutes at a time, worse with exertion, better with rest.  Associated diaphoresis lightheadedness and shortness of breath.  Earlier today the pain felt like a severe pressure.  Overall he feels like the symptoms are becoming more frequent and worse.  At present time his chest pain is resolved but he still feels generalized weakness and lightheadedness.  Pain radiates from the center of the chest to the right arm.  Also reports decreased oral intake due to loss of appetite.      Past Medical History:  Diagnosis Date  . Diabetes mellitus without complication ([place])   . Gout   . Hypertension      Patient Active Problem List   Diagnosis Date Noted  . NSTEMI (non-ST elevated myocardial infarction) ([place]) 07/24/2017     Past Surgical History:  Procedure Laterality Date  . CORONARY STENT INTERVENTION N/A 07/25/2017   Procedure: CORONARY STENT INTERVENTION;  Surgeon: Yolonda Kida, MD;  Location: [place] [facility];  Service: Cardiovascular;  Laterality: N/A;  . LEFT HEART CATH AND CORONARY ANGIOGRAPHY N/A 07/25/2017   Procedure: LEFT HEART CATH AND CORONARY ANGIOGRAPHY;  Surgeon: Corey Skains, MD;  Location: [name] [facility];  Service: Cardiovascular;  Laterality: N/A;     Prior to Admission medications   Medication Sig Start Date End Date Taking? Authorizing Provider  apixaban (ELIQUIS) 5 MG TABS tablet Take 5 mg by mouth 2 (two) times a  day. 07/10/18  Yes [provider]  aspirin 81 MG chewable tablet Chew 1 tablet (81 mg total) by mouth daily. 07/27/17  Yes Vaughan Basta, MD  atorvastatin (LIPITOR) 80 MG tablet Take 1 tablet (80 mg total) by mouth daily at 6 PM. 07/26/17  Yes Vaughan Basta, MD  lisinopril (PRINIVIL,ZESTRIL) 10 MG tablet Take 1 tablet (10 mg total) by mouth daily. 07/26/17 07/26/18  Vaughan Basta, MD  metoprolol succinate (TOPROL XL) 25 MG 24 hr tablet Take 1 tablet (25 mg total) by mouth daily. 07/26/17 07/26/18  Vaughan Basta, MD  naproxen (NAPROSYN) 500 MG tablet Take 1 tablet (500 mg total) by mouth 2 (two) times daily with a meal. 08/06/18   Triplett, Cari B, FNP  nitroGLYCERIN (NITROSTAT) 0.4 MG SL tablet Place 1 tablet (0.4 mg total) under the tongue every 5 (five) minutes as needed for chest pain. 07/26/17   Vaughan Basta, MD  traMADol (ULTRAM) 50 MG tablet Take 1 tablet (50 mg total) by mouth every 6 (six) hours as needed. 08/06/18   Victorino Dike, FNP     Allergies Penicillins   Family History  Problem Relation Age of Onset  . CAD Mother   . CAD Father     Social History Social History   Tobacco Use  . Smoking status: Current Every Day Smoker    Packs/day: 0.50    Last attempt to quit: 08/10/2017    Years since quitting: 1.1  . Smokeless tobacco: Never Used  Substance Use Topics  . Alcohol use: Not Currently  .  Drug use: No    Review of Systems  Constitutional:   No fever or chills.  ENT:   No sore throat. No rhinorrhea. Cardiovascular:   Positive as above chest pain without syncope. Respiratory:   No dyspnea or cough. Gastrointestinal:   Negative for abdominal pain, vomiting and diarrhea.  Musculoskeletal:   Negative for focal pain or swelling All other systems reviewed and are negative except as documented above in ROS and HPI.  ____________________________________________   PHYSICAL EXAM:  VITAL SIGNS: ED Triage Vitals  Enc  Vitals Group     BP 09/26/18 1307 110/63     Pulse Rate 09/26/18 1305 81     Resp 09/26/18 1305 16     Temp 09/26/18 1305 98.6 F (37 C)     Temp Source 09/26/18 1305 Oral     SpO2 09/26/18 1305 98 %     Weight 09/26/18 1305 150 lb (68 kg)     Height 09/26/18 1305 5\' 5"  (1.651 m)     Head Circumference --      Peak Flow --      Pain Score 09/26/18 1305 9     Pain Loc --      Pain Edu? --      Excl. in GC? --     Vital signs reviewed, nursing assessments reviewed.   Constitutional:   Alert and oriented. Non-toxic appearance. Eyes:   Conjunctivae are normal. EOMI. PERRL. ENT      Head:   Normocephalic and atraumatic.      Nose:   No congestion/rhinnorhea.       Mouth/Throat:   MMM, no pharyngeal erythema. No peritonsillar mass.       Neck:   No meningismus. Full ROM. Hematological/Lymphatic/Immunilogical:   No cervical lymphadenopathy. Cardiovascular:   RRR. Symmetric bilateral radial and DP pulses.  No murmurs. Cap refill less than 2 seconds. Respiratory:   Normal respiratory effort without tachypnea/retractions. Breath sounds are clear and equal bilaterally. No wheezes/rales/rhonchi. Gastrointestinal:   Soft and nontender. Non distended. There is no CVA tenderness.  No rebound, rigidity, or guarding.  Musculoskeletal:   Normal range of motion in all extremities. No joint effusions.  No lower extremity tenderness.  No edema. Neurologic:   Normal speech and language.  Motor grossly intact. No acute focal neurologic deficits are appreciated.  Skin:    Skin is warm, dry and intact. No rash noted.  No petechiae, purpura, or bullae.  ____________________________________________    LABS (pertinent positives/negatives) (all labs ordered are listed, but only abnormal results are displayed) Labs Reviewed  BASIC METABOLIC PANEL - Abnormal; Notable for the following components:      Result Value   CO2 21 (*)    Glucose, Bld 171 (*)    All other components within normal limits   CBC - Abnormal; Notable for the following components:   WBC 10.7 (*)    RBC 7.15 (*)    MCV 68.4 (*)    MCH 21.1 (*)    RDW 22.9 (*)    Platelets 442 (*)    All other components within normal limits  TROPONIN I (HIGH SENSITIVITY) - Abnormal; Notable for the following components:   Troponin I (High Sensitivity) 61 (*)    All other components within normal limits  PROTIME-INR  APTT  HEPARIN LEVEL (UNFRACTIONATED)   ____________________________________________   EKG  Interpreted by me Sinus rhythm rate of 77, left axis, right bundle branch block.  Left ventricular hypertrophy.  For PVCs on  the strip.  No acute ischemic changes.  ____________________________________________    RADIOLOGY  No results found.  ____________________________________________   PROCEDURES .Critical Care Performed by: Sharman CheekStafford, Shaley Leavens, MD Authorized by: Sharman CheekStafford, Sender Rueb, MD   Critical care provider statement:    Critical care time (minutes):  30   Critical care time was exclusive of:  Separately billable procedures and treating other patients   Critical care was necessary to treat or prevent imminent or life-threatening deterioration of the following conditions:  Cardiac failure   Critical care was time spent personally by me on the following activities:  Development of treatment plan with patient or surrogate, discussions with consultants, evaluation of patient's response to treatment, examination of patient, obtaining history from patient or surrogate, ordering and performing treatments and interventions, ordering and review of laboratory studies, ordering and review of radiographic studies, pulse oximetry, re-evaluation of patient's condition and review of old charts    ____________________________________________  DIFFERENTIAL DIAGNOSIS   Non-STEMI, unstable angina  CLINICAL IMPRESSION / ASSESSMENT AND PLAN / ED COURSE  Medications ordered in the ED: Medications - No data to  display  Pertinent labs & imaging results that were available during my care of the patient were reviewed by me and considered in my medical decision making (see chart for details).  Priscille Kluverathaniel Shiner was evaluated in Emergency Department on 09/26/2018 for the symptoms described in the history of present illness. He was evaluated in the context of the global COVID-19 pandemic, which necessitated consideration that the patient might be at risk for infection with the SARS-CoV-2 virus that causes COVID-19. Institutional protocols and algorithms that pertain to the evaluation of patients at risk for COVID-19 are in a state of rapid change based on information released by regulatory bodies including the CDC and federal and state organizations. These policies and algorithms were followed during the patient's care in the ED.   Patient presents with worsening exertional chest pain associated with diaphoresis and shortness of breath.  Currently no chest pain but still feels weak and lightheaded.  Will give IV fluids for hydration, start heparin.  Full dose aspirin.  Will need to admit for further management.      ____________________________________________   FINAL CLINICAL IMPRESSION(S) / ED DIAGNOSES    Final diagnoses:  Unstable angina (HCC)  Precordial pain     ED Discharge Orders    None      Portions of this note were generated with dragon dictation software. Dictation errors may occur despite best attempts at proofreading.   Sharman CheekStafford, Elsi Stelzer, MD 09/26/18 1426

## 2018-09-26 NOTE — ED Provider Notes (Signed)
Patient stated to staff he did not want to be admitted. Had a discussion with the patient about concern for heart damage and desire for further monitoring and management. Discussed risks of discharge including heart attack and death. Patient verbalized that he would like to be discharged. Discussed strict return precautions and that he should return for any further symptoms. Discussed importance of follow up.    Nance Pear, MD 09/26/18 Curly Rim

## 2018-10-12 ENCOUNTER — Encounter: Payer: Self-pay | Admitting: Emergency Medicine

## 2018-10-12 ENCOUNTER — Emergency Department
Admission: EM | Admit: 2018-10-12 | Discharge: 2018-10-12 | Disposition: A | Discharge status: Home / Self Care | Payer: Medicare Other | Attending: Emergency Medicine | Admitting: Emergency Medicine

## 2018-10-12 ENCOUNTER — Other Ambulatory Visit: Payer: Self-pay

## 2018-10-12 DIAGNOSIS — I259 Chronic ischemic heart disease, unspecified: Secondary | ICD-10-CM | POA: Insufficient documentation

## 2018-10-12 DIAGNOSIS — Z955 Presence of coronary angioplasty implant and graft: Secondary | ICD-10-CM | POA: Insufficient documentation

## 2018-10-12 DIAGNOSIS — F17210 Nicotine dependence, cigarettes, uncomplicated: Secondary | ICD-10-CM | POA: Insufficient documentation

## 2018-10-12 DIAGNOSIS — M10061 Idiopathic gout, right knee: Secondary | ICD-10-CM | POA: Diagnosis not present

## 2018-10-12 DIAGNOSIS — E119 Type 2 diabetes mellitus without complications: Secondary | ICD-10-CM | POA: Insufficient documentation

## 2018-10-12 DIAGNOSIS — Z7901 Long term (current) use of anticoagulants: Secondary | ICD-10-CM | POA: Insufficient documentation

## 2018-10-12 DIAGNOSIS — Z7984 Long term (current) use of oral hypoglycemic drugs: Secondary | ICD-10-CM | POA: Diagnosis not present

## 2018-10-12 DIAGNOSIS — I10 Essential (primary) hypertension: Secondary | ICD-10-CM | POA: Insufficient documentation

## 2018-10-12 DIAGNOSIS — Z79899 Other long term (current) drug therapy: Secondary | ICD-10-CM | POA: Diagnosis not present

## 2018-10-12 DIAGNOSIS — Z7982 Long term (current) use of aspirin: Secondary | ICD-10-CM | POA: Diagnosis not present

## 2018-10-12 DIAGNOSIS — M109 Gout, unspecified: Secondary | ICD-10-CM

## 2018-10-12 DIAGNOSIS — M25561 Pain in right knee: Secondary | ICD-10-CM | POA: Diagnosis present

## 2018-10-12 LAB — CBC WITH DIFFERENTIAL/PLATELET
Abs Immature Granulocytes: 0.05 10*3/uL (ref 0.00–0.07)
Basophils Absolute: 0.2 10*3/uL — ABNORMAL HIGH (ref 0.0–0.1)
Basophils Relative: 1 %
Eosinophils Absolute: 0.6 10*3/uL — ABNORMAL HIGH (ref 0.0–0.5)
Eosinophils Relative: 5 %
HCT: 45.6 % (ref 39.0–52.0)
Hemoglobin: 14.2 g/dL (ref 13.0–17.0)
Immature Granulocytes: 0 %
Lymphocytes Relative: 18 %
Lymphs Abs: 2.3 10*3/uL (ref 0.7–4.0)
MCH: 21 pg — ABNORMAL LOW (ref 26.0–34.0)
MCHC: 31.1 g/dL (ref 30.0–36.0)
MCV: 67.5 fL — ABNORMAL LOW (ref 80.0–100.0)
Monocytes Absolute: 1.6 10*3/uL — ABNORMAL HIGH (ref 0.1–1.0)
Monocytes Relative: 13 %
Neutro Abs: 8 10*3/uL — ABNORMAL HIGH (ref 1.7–7.7)
Neutrophils Relative %: 63 %
Platelets: 567 10*3/uL — ABNORMAL HIGH (ref 150–400)
RBC: 6.76 MIL/uL — ABNORMAL HIGH (ref 4.22–5.81)
RDW: 21.4 % — ABNORMAL HIGH (ref 11.5–15.5)
WBC: 12.6 10*3/uL — ABNORMAL HIGH (ref 4.0–10.5)
nRBC: 0 % (ref 0.0–0.2)

## 2018-10-12 LAB — SYNOVIAL CELL COUNT + DIFF, W/ CRYSTALS
Eosinophils-Synovial: 0 %
Lymphocytes-Synovial Fld: 4 %
Monocyte-Macrophage-Synovial Fluid: 0 %
Neutrophil, Synovial: 96 %
Other Cells-SYN: 0
WBC, Synovial: 28766 /mm3 — ABNORMAL HIGH (ref 0–200)

## 2018-10-12 LAB — BASIC METABOLIC PANEL
Anion gap: 8 (ref 5–15)
BUN: 13 mg/dL (ref 8–23)
CO2: 22 mmol/L (ref 22–32)
Calcium: 8.8 mg/dL — ABNORMAL LOW (ref 8.9–10.3)
Chloride: 108 mmol/L (ref 98–111)
Creatinine, Ser: 0.76 mg/dL (ref 0.61–1.24)
GFR calc Af Amer: >60 mL/min (ref >60)
GFR calc non Af Amer: >60 mL/min (ref >60)
Glucose, Bld: 126 mg/dL — ABNORMAL HIGH (ref 70–99)
Potassium: 3.7 mmol/L (ref 3.5–5.1)
Sodium: 138 mmol/L (ref 135–145)

## 2018-10-12 LAB — URIC ACID: Uric Acid, Serum: 5.4 mg/dL (ref 3.7–8.6)

## 2018-10-12 MED ORDER — OXYCODONE-ACETAMINOPHEN 5-325 MG PO TABS: 1.0000 | ORAL_TABLET | Freq: Four times a day (QID) | ORAL | 0 refills | Status: DC | PRN

## 2018-10-12 MED ORDER — FAMOTIDINE 20 MG PO TABS: 20.0000 mg | ORAL_TABLET | Freq: Two times a day (BID) | ORAL | 0 refills | Status: DC

## 2018-10-12 MED ORDER — NAPROXEN 500 MG PO TABS: 500.0000 mg | ORAL_TABLET | Freq: Two times a day (BID) | ORAL | 0 refills | Status: DC

## 2018-10-12 MED ORDER — PREDNISONE 10 MG (21) PO TBPK: ORAL_TABLET | ORAL | 0 refills | Status: DC

## 2018-10-12 MED ORDER — SODIUM CHLORIDE 0.9 % IV BOLUS
1000.0000 mL | Freq: Once | INTRAVENOUS | Status: AC
  Administered 2018-10-12: 1000 mL via INTRAVENOUS

## 2018-10-12 MED ORDER — FAMOTIDINE 20 MG PO TABS
40.0000 mg | ORAL_TABLET | Freq: Once | ORAL | Status: AC
  Administered 2018-10-12: 11:00:00 40 mg via ORAL
  Filled 2018-10-12: qty 2

## 2018-10-12 MED ORDER — METHYLPREDNISOLONE SODIUM SUCC 125 MG IJ SOLR
125.0000 mg | INTRAMUSCULAR | Status: AC
  Administered 2018-10-12: 125 mg via INTRAVENOUS
  Filled 2018-10-12: qty 2

## 2018-10-12 MED ORDER — LIDOCAINE-EPINEPHRINE 2 %-1:100000 IJ SOLN
20.0000 mL | Freq: Once | INTRAMUSCULAR | Status: AC
  Administered 2018-10-12: 20 mL
  Filled 2018-10-12: qty 1

## 2018-10-12 MED ORDER — NAPROXEN 500 MG PO TABS
500.0000 mg | ORAL_TABLET | Freq: Once | ORAL | Status: AC
  Administered 2018-10-12: 11:00:00 500 mg via ORAL
  Filled 2018-10-12: qty 1

## 2018-10-12 MED ORDER — HYDROMORPHONE HCL 1 MG/ML IJ SOLN
1.0000 mg | Freq: Once | INTRAMUSCULAR | Status: AC
  Administered 2018-10-12: 1 mg via INTRAVENOUS
  Filled 2018-10-12: qty 1

## 2018-10-12 NOTE — ED Notes (Signed)
[id number].  Skin warm and dry. NAD

## 2018-10-12 NOTE — ED Provider Notes (Signed)
[facility] Emergency Department Provider Note  ____________________________________________  Time seen: Approximately 7:57 AM  I have reviewed the triage vital signs and the nursing notes.   HISTORY  Chief Complaint Leg Pain    HPI Travis Maxwell is a 68 y.o. male with a history of gout, hypertension, diabetes, CAD status post non-STEMI and PCI who comes the ED complaining of gradual onset of right knee pain for the past 2 days.  Constant, worsening, severe, radiating down to the ankle.  Denies any trauma.  No chest pain or shortness of breath.  Associated with knee swelling.  Denies fever but endorses chills.   States he has a history of gout and says he was supposed to be on daily prophylactic medication but never received a prescription for it.    Patient was seen in the ED by myself 2 weeks ago for chest pain and at that time I was concerned for unstable angina and wanted to admit the patient but he left AGAINST MEDICAL ADVICE.  Today he reports that he is not having any chest pain or exertional symptoms recently.   Past Medical History:  Diagnosis Date  . Diabetes mellitus without complication (HCC)   . Gout   . Hypertension      Patient Active Problem List   Diagnosis Date Noted  . NSTEMI (non-ST elevated myocardial infarction) (HCC) 07/24/2017     Past Surgical History:  Procedure Laterality Date  . CORONARY STENT INTERVENTION N/A 07/25/2017   Procedure: CORONARY STENT INTERVENTION;  Surgeon: Alwyn Pea, MD;  Location: [facility] INVASIVE [facility];  Service: Cardiovascular;  Laterality: N/A;  . LEFT HEART CATH AND CORONARY ANGIOGRAPHY N/A 07/25/2017   Procedure: LEFT HEART CATH AND CORONARY ANGIOGRAPHY;  Surgeon: Lamar Blinks, MD;  Location: [facility] INVASIVE [facility];  Service: Cardiovascular;  Laterality: N/A;     Prior to Admission medications   Medication Sig Start Date End Date Taking? Authorizing Provider  allopurinol (ZYLOPRIM) 300 MG  tablet Take 300 mg by mouth daily. 03/22/18  Yes [provider]  apixaban (ELIQUIS) 5 MG TABS tablet Take 5 mg by mouth 2 (two) times a day. 07/10/18  Yes [provider]  aspirin 81 MG chewable tablet Chew 1 tablet (81 mg total) by mouth daily. 07/27/17  Yes Altamese Dilling, MD  atorvastatin (LIPITOR) 80 MG tablet Take 1 tablet (80 mg total) by mouth daily at 6 PM. 07/26/17  Yes Altamese Dilling, MD  carvedilol (COREG) 12.5 MG tablet Take 12.5 mg by mouth 2 (two) times a day. 03/21/18  Yes [provider]  furosemide (LASIX) 20 MG tablet Take 20 mg by mouth daily. 03/21/18  Yes [provider]  lisinopril (PRINIVIL,ZESTRIL) 10 MG tablet Take 1 tablet (10 mg total) by mouth daily. 07/26/17 10/12/18 Yes Altamese Dilling, MD  Magnesium Oxide 400 (240 Mg) MG TABS Take 400 mg by mouth 2 (two) times a day. 07/11/18  Yes [provider]  metoprolol succinate (TOPROL XL) 25 MG 24 hr tablet Take 1 tablet (25 mg total) by mouth daily. 07/26/17 10/12/18 Yes Altamese Dilling, MD  nitroGLYCERIN (NITROSTAT) 0.4 MG SL tablet Place 1 tablet (0.4 mg total) under the tongue every 5 (five) minutes as needed for chest pain. 09/26/18  Yes Phineas Semen, MD  potassium chloride SA (K-DUR) 20 MEQ tablet Take 20 mEq by mouth daily. 07/11/18  Yes [provider]  tamsulosin (FLOMAX) 0.4 MG CAPS capsule Take 0.4 mg by mouth daily. 03/22/18  Yes [provider]  traMADol (ULTRAM) 50 MG tablet Take 1 tablet (50 mg total) by mouth every 6 (six) hours as needed. 08/06/18  Yes Triplett, Cari B, FNP  famotidine (PEPCID) 20 MG tablet Take 1 tablet (20 mg total) by mouth 2 (two) times daily. 10/12/18   Sharman CheekStafford, Briza Bark, MD  naproxen (NAPROSYN) 500 MG tablet Take 1 tablet (500 mg total) by mouth 2 (two) times daily with a meal. 10/12/18   Sharman CheekStafford, Sharda Keddy, MD  oxyCODONE-acetaminophen (PERCOCET) 5-325 MG tablet Take 1 tablet by mouth every 6 (six) hours as needed for  severe pain. 10/12/18 10/12/19  Sharman CheekStafford, Xan Sparkman, MD  predniSONE (STERAPRED UNI-PAK 21 TAB) 10 MG (21) TBPK tablet 6 tablets on day 1, then 5 tablets on day 2, then 4 tablets on day 3, then 3 tablets on day 4, then 2 tablets on day 5, then 1 tablet on day 6. 10/13/18   Sharman CheekStafford, Esma Kilts, MD     Allergies Penicillins   Family History  Problem Relation Age of Onset  . CAD Mother   . CAD Father     Social History Social History   Tobacco Use  . Smoking status: Current Every Day Smoker    Packs/day: 0.50    Types: Cigarettes    Last attempt to quit: 08/10/2017    Years since quitting: 1.1  . Smokeless tobacco: Never Used  Substance Use Topics  . Alcohol use: Not Currently  . Drug use: No    Review of Systems  Constitutional:   No fever positive chills.  ENT:   No sore throat. No rhinorrhea. Cardiovascular:   No chest pain or syncope. Respiratory:   No dyspnea or cough. Gastrointestinal:   Negative for abdominal pain, vomiting and diarrhea.  Musculoskeletal:   Right knee pain and swelling as above All other systems reviewed and are negative except as documented above in ROS and HPI.  ____________________________________________   PHYSICAL EXAM:  VITAL SIGNS: ED Triage Vitals  Enc Vitals Group     BP 10/12/18 0644 128/73     Pulse Rate 10/12/18 0644 73     Resp 10/12/18 0644 18     Temp 10/12/18 0644 99 F (37.2 C)     Temp Source 10/12/18 0644 Oral     SpO2 10/12/18 0644 98 %     Weight 10/12/18 0645 160 lb (72.6 kg)     Height 10/12/18 0645 5\' 5"  (1.651 m)     Head Circumference --      Peak Flow --      Pain Score 10/12/18 0644 10     Pain Loc --      Pain Edu? --      Excl. in GC? --     Vital signs reviewed, nursing assessments reviewed.   Constitutional:   Alert and oriented. Non-toxic appearance. Eyes:   Conjunctivae are normal. EOMI. PERRL. ENT      Head:   Normocephalic and atraumatic.         Neck:   No meningismus. Full  ROM. Hematological/Lymphatic/Immunilogical:   No cervical lymphadenopathy. Cardiovascular:   RRR. Symmetric bilateral radial and DP pulses.  No murmurs. Cap refill less than 2 seconds. Respiratory:   Normal respiratory effort without tachypnea/retractions. Breath sounds are clear and equal bilaterally. No wheezes/rales/rhonchi. Gastrointestinal:   Soft and nontender. Non distended. There is no CVA tenderness.  No rebound, rigidity, or guarding. Musculoskeletal:   Pronounced right knee effusion with tenderness about the joint.  Patella is ballotable with tenderness.  Severe pain with any range of motion of the right knee.  No bony tenderness.  Peripheral perfusion is intact, calf is nontender with symmetric calf circumference, no palpable cord, no evidence of lymphangitis or crepitus or cellulitis. Neurologic:   Normal speech and language.  Motor grossly intact. No acute focal neurologic deficits are appreciated.  Skin:    Skin is warm, dry and intact. No rash noted.  No petechiae, purpura, or bullae.  ____________________________________________    LABS (pertinent positives/negatives) (all labs ordered are listed, but only abnormal results are displayed) Labs Reviewed  BASIC METABOLIC PANEL - Abnormal; Notable for the following components:      Result Value   Glucose, Bld 126 (*)    Calcium 8.8 (*)    All other components within normal limits  CBC WITH DIFFERENTIAL/PLATELET - Abnormal; Notable for the following components:   WBC 12.6 (*)    RBC 6.76 (*)    MCV 67.5 (*)    MCH 21.0 (*)    RDW 21.4 (*)    Platelets 567 (*)    Neutro Abs 8.0 (*)    Monocytes Absolute 1.6 (*)    Eosinophils Absolute 0.6 (*)    Basophils Absolute 0.2 (*)    All other components within normal limits  SYNOVIAL CELL COUNT + DIFF, W/ CRYSTALS - Abnormal; Notable for the following components:   Appearance-Synovial CLOUDY (*)    WBC, Synovial 28,766 (*)    All other components within normal limits  BODY  FLUID CULTURE  URIC ACID  GLUCOSE, BODY FLUID OTHER  PROTEIN, BODY FLUID (OTHER)  URIC ACID, BODY FLUID   ____________________________________________   EKG    ____________________________________________    RADIOLOGY  No results found.  ____________________________________________   PROCEDURES .Joint Aspiration/Arthrocentesis  Date/Time: 10/12/2018 8:42 AM Performed by: Sharman CheekStafford, Vincente Asbridge, MD Authorized by: Sharman CheekStafford, Lance Huaracha, MD   Consent:    Consent obtained:  Written and verbal   Consent given by:  Patient   Risks discussed:  Bleeding, infection, pain and nerve damage   Alternatives discussed:  Observation Universal protocol:    Procedure explained and questions answered to patient or proxy's satisfaction: yes     Site/side marked: yes     Immediately prior to procedure, a time out was called: yes     Patient identity confirmed:  Verbally with patient and arm band Location:    Location:  Knee   Knee:  R knee Anesthesia (see MAR for exact dosages):    Anesthesia method:  Local infiltration   Local anesthetic:  Lidocaine 1% WITH epi Procedure details:    Preparation: Patient was prepped and draped in usual sterile fashion     Needle gauge:  18 G   Ultrasound guidance: no     Approach:  Lateral   Aspirate amount:  65 ml   Aspirate characteristics:  Yellow   Steroid injected: no     Specimen collected: yes   Post-procedure details:    Dressing:  Adhesive bandage   Patient tolerance of procedure:  Tolerated well, no immediate complications    ____________________________________________  DIFFERENTIAL DIAGNOSIS   Gout, septic arthritis, traumatic effusion, osteoarthritis  CLINICAL IMPRESSION / ASSESSMENT AND PLAN / ED COURSE  Medications ordered in the ED: Medications  lidocaine-EPINEPHrine (XYLOCAINE W/EPI) 2 %-1:100000 (with pres) injection 20 mL (20 mLs Infiltration Given by Other 10/12/18 [id number])  HYDROmorphone (DILAUDID) injection 1 mg (1  mg Intravenous Given 10/12/18 0824)  sodium chloride 0.9 % bolus 1,000 mL (0 mLs Intravenous  Stopped 10/12/18 0930)  methylPREDNISolone sodium succinate (SOLU-MEDROL) 125 mg/2 mL injection 125 mg (125 mg Intravenous Given 10/12/18 1130)  naproxen (NAPROSYN) tablet 500 mg (500 mg Oral Given 10/12/18 1127)  famotidine (PEPCID) tablet 40 mg (40 mg Oral Given 10/12/18 1127)    Pertinent labs & imaging results that were available during my care of the patient were reviewed by me and considered in my medical decision making (see chart for details).  Travis Maxwell was evaluated in Emergency Department on 10/12/2018 for the symptoms described in the history of present illness. He was evaluated in the context of the global COVID-19 pandemic, which necessitated consideration that the patient might be at risk for infection with the SARS-CoV-2 virus that causes COVID-19. Institutional protocols and algorithms that pertain to the evaluation of patients at risk for COVID-19 are in a state of rapid change based on information released by regulatory bodies including the CDC and federal and state organizations. These policies and algorithms were followed during the patient's care in the ED.   Patient presents with right knee pain and swelling.  Likely due to gout with the absence of fever, but with his comorbidities and severe pain in the immunosuppressant nature of gout treatment, I will give Dilaudid 1 mg IV while checking some labs including a serum uric acid and doing an arthrocentesis to rule out septic arthritis.  Patient consents to procedure understanding risks of pain infection bleeding and nerve injury.  Clinical Course as of Oct 12 1442  Fri Oct 12, 2018  0840 Arthrocentesis completed, 65 mL of greenish straw-colored fluid removed without complication.  Patient reports the knee pain is much improved after fluid removal and he can now move the leg without pain.  Will await results from serum and synovial labs.   [PS]   1116 Synovial cell count shows 28,000 white blood cells and uric acid crystals consistent with gout and highly doubtful for septic arthritis.   [PS]  1200 Monocyte-Macrophage-Synovial Fluid: 0 [PS]    Clinical Course User Index [PS] Carrie Mew, MD     ____________________________________________   FINAL CLINICAL IMPRESSION(S) / ED DIAGNOSES    Final diagnoses:  Gouty arthritis of right knee     ED Discharge Orders         Ordered    predniSONE (STERAPRED UNI-PAK 21 TAB) 10 MG (21) TBPK tablet     10/12/18 1444    naproxen (NAPROSYN) 500 MG tablet  2 times daily with meals     10/12/18 1444    famotidine (PEPCID) 20 MG tablet  2 times daily     10/12/18 1444    oxyCODONE-acetaminophen (PERCOCET) 5-325 MG tablet  Every 6 hours PRN     10/12/18 1444          Portions of this note were generated with dragon dictation software. Dictation errors may occur despite best attempts at proofreading.   Carrie Mew, MD 10/12/18 1444

## 2018-10-12 NOTE — ED Notes (Signed)
This RN to bedside due to pt calling out. Pt given personal cell phone per his request. Pt appears comfortable in bed, continues to rate pain 7 out of 10. Will continue to monitor.

## 2018-10-12 NOTE — ED Triage Notes (Signed)
Patient c/o right lower leg pain, beginning at the knee and radiating to foot. Patient reports knee and ankle swelling. Patient denies injury/fall.

## 2018-10-12 NOTE — ED Notes (Signed)
Pt moved to 1H, this RN apologized to patient for move to hall bed. Pt states understanding at this time. Will continue to monitor for further patient needs.

## 2018-10-12 NOTE — ED Notes (Signed)
Successful arthrocentesis completed by MD. Fluid slightly cloudy. Walked to lab by [employer], Therapist, sports and [employer], Therapist, sports.

## 2018-10-13 LAB — GLUCOSE, BODY FLUID OTHER: Glucose, Body Fluid Other: 71 mg/dL

## 2018-10-13 LAB — URIC ACID, BODY FLUID: Uric Acid Body Fluid: 4.7 mg/dL

## 2018-10-13 LAB — PROTEIN, BODY FLUID (OTHER): Total Protein, Body Fluid Other: 3.9 g/dL

## 2018-10-15 LAB — BODY FLUID CULTURE: Culture: NO GROWTH

## 2018-12-06 ENCOUNTER — Emergency Department
Admission: EM | Admit: 2018-12-06 | Discharge: 2018-12-06 | Disposition: A | Discharge status: Home / Self Care | Payer: No Typology Code available for payment source | Attending: Emergency Medicine | Admitting: Emergency Medicine

## 2018-12-06 ENCOUNTER — Emergency Department: Payer: No Typology Code available for payment source

## 2018-12-06 ENCOUNTER — Other Ambulatory Visit: Payer: Self-pay

## 2018-12-06 DIAGNOSIS — F17210 Nicotine dependence, cigarettes, uncomplicated: Secondary | ICD-10-CM | POA: Diagnosis not present

## 2018-12-06 DIAGNOSIS — M62830 Muscle spasm of back: Secondary | ICD-10-CM | POA: Insufficient documentation

## 2018-12-06 DIAGNOSIS — Z7982 Long term (current) use of aspirin: Secondary | ICD-10-CM | POA: Insufficient documentation

## 2018-12-06 DIAGNOSIS — Z79899 Other long term (current) drug therapy: Secondary | ICD-10-CM | POA: Insufficient documentation

## 2018-12-06 DIAGNOSIS — M545 Low back pain: Secondary | ICD-10-CM | POA: Diagnosis present

## 2018-12-06 DIAGNOSIS — Z7901 Long term (current) use of anticoagulants: Secondary | ICD-10-CM | POA: Diagnosis not present

## 2018-12-06 DIAGNOSIS — T148XXA Other injury of unspecified body region, initial encounter: Secondary | ICD-10-CM

## 2018-12-06 MED ORDER — TRAMADOL HCL 50 MG PO TABS: 50.0000 mg | ORAL_TABLET | Freq: Four times a day (QID) | ORAL | 0 refills | Status: DC | PRN

## 2018-12-06 MED ORDER — CYCLOBENZAPRINE HCL 10 MG PO TABS: 10.0000 mg | ORAL_TABLET | Freq: Three times a day (TID) | ORAL | 0 refills | Status: DC | PRN

## 2018-12-06 MED ORDER — ACETAMINOPHEN 500 MG PO TABS
1000.0000 mg | ORAL_TABLET | Freq: Once | ORAL | Status: AC
  Administered 2018-12-06: 1000 mg via ORAL
  Filled 2018-12-06: qty 2

## 2018-12-06 MED ORDER — TRAMADOL HCL 50 MG PO TABS
50.0000 mg | ORAL_TABLET | Freq: Once | ORAL | Status: AC
  Administered 2018-12-06: 50 mg via ORAL
  Filled 2018-12-06: qty 1
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
## 2019-06-13 ENCOUNTER — Emergency Department
Admission: EM | Admit: 2019-06-13 | Discharge: 2019-06-13 | Disposition: A | Discharge status: Home / Self Care | Payer: Medicare HMO | Attending: Emergency Medicine | Admitting: Emergency Medicine

## 2019-06-13 ENCOUNTER — Other Ambulatory Visit: Payer: Self-pay

## 2019-06-13 ENCOUNTER — Encounter: Payer: Self-pay | Admitting: Emergency Medicine

## 2019-06-13 DIAGNOSIS — I252 Old myocardial infarction: Secondary | ICD-10-CM | POA: Insufficient documentation

## 2019-06-13 DIAGNOSIS — M25462 Effusion, left knee: Secondary | ICD-10-CM

## 2019-06-13 DIAGNOSIS — M25562 Pain in left knee: Secondary | ICD-10-CM | POA: Diagnosis present

## 2019-06-13 DIAGNOSIS — Z7982 Long term (current) use of aspirin: Secondary | ICD-10-CM | POA: Insufficient documentation

## 2019-06-13 DIAGNOSIS — Z79899 Other long term (current) drug therapy: Secondary | ICD-10-CM | POA: Diagnosis not present

## 2019-06-13 DIAGNOSIS — F17210 Nicotine dependence, cigarettes, uncomplicated: Secondary | ICD-10-CM | POA: Diagnosis not present

## 2019-06-13 DIAGNOSIS — M10062 Idiopathic gout, left knee: Secondary | ICD-10-CM | POA: Diagnosis not present

## 2019-06-13 DIAGNOSIS — E119 Type 2 diabetes mellitus without complications: Secondary | ICD-10-CM | POA: Insufficient documentation

## 2019-06-13 DIAGNOSIS — Z7901 Long term (current) use of anticoagulants: Secondary | ICD-10-CM | POA: Insufficient documentation

## 2019-06-13 DIAGNOSIS — I10 Essential (primary) hypertension: Secondary | ICD-10-CM | POA: Diagnosis not present

## 2019-06-13 MED ORDER — LISINOPRIL 10 MG PO TABS: 10.0000 mg | ORAL_TABLET | Freq: Every day | ORAL | 0 refills | Status: DC

## 2019-06-13 MED ORDER — ALLOPURINOL 300 MG PO TABS: 300.0000 mg | ORAL_TABLET | Freq: Every day | ORAL | 0 refills | Status: DC

## 2019-06-13 MED ORDER — HYDROCODONE-ACETAMINOPHEN 5-325 MG PO TABS
1.0000 | ORAL_TABLET | Freq: Once | ORAL | Status: AC
  Administered 2019-06-13: 17:00:00 1 via ORAL
  Filled 2019-06-13: qty 1

## 2019-06-13 MED ORDER — COLCHICINE 0.6 MG PO TABS: ORAL_TABLET | ORAL | 0 refills | Status: DC

## 2019-06-13 MED ORDER — CARVEDILOL 12.5 MG PO TABS: 12.5000 mg | ORAL_TABLET | Freq: Two times a day (BID) | ORAL | 0 refills | Status: DC

## 2019-06-13 MED ORDER — APIXABAN 5 MG PO TABS: 5.0000 mg | ORAL_TABLET | Freq: Two times a day (BID) | ORAL | 0 refills | Status: AC

## 2019-06-13 MED ORDER — COLCHICINE 0.6 MG PO TABS
0.6000 mg | ORAL_TABLET | Freq: Once | ORAL | Status: AC
  Administered 2019-06-13: 0.6 mg via ORAL
  Filled 2019-06-13 (×2): qty 1

## 2019-06-13 MED ORDER — HYDROCODONE-ACETAMINOPHEN 5-325 MG PO TABS: 1.0000 | ORAL_TABLET | Freq: Three times a day (TID) | ORAL | 0 refills | Status: AC | PRN

## 2019-06-13 NOTE — ED Notes (Signed)
See triage note  Presents with left knee pain and swelling  States sxs' started on Monday

## 2019-06-13 NOTE — ED Triage Notes (Addendum)
Patient presents to the ED with left knee pain and swelling since Monday.  Patient states he feels like it is gout.  Patient reports issues with gout in his knee before.  Patient states, "I also need some blood pressure medicine and gout medicine and pain medicine."  Patient states he has a primary care provider but has not seen them in, "a long time."  Patient states he has been out of his bp medicine x 1 month but states he took his last pill today.

## 2019-06-13 NOTE — ED Provider Notes (Signed)
[facility] Emergency Department Provider Note ____________________________________________  Time seen: 1618  I have reviewed the triage vital signs and the nursing notes.  HISTORY  Chief Complaint  Knee Pain  HPI Travis Maxwell is a 69 y.o. male presents himself to the ED for evaluation of acute left knee pain peer patient describes onset of left knee pain and swelling consistent with his gout history, after eating a seafood meal.   She denies any preceding trauma or fall.  Patient takes allopurinol daily for gout, as well as a list of other hypertensive medicines and Eliquis.  He reports he has been unable to have his primary provider at [facility] clinic refill his medications for the last week.  He reports taken his last blood pressure pill today.  He denies any chest pain, shortness of breath, syncope, or weakness.  Past Medical History:  Diagnosis Date  . Diabetes mellitus without complication (HCC)   . Gout   . Hypertension     Patient Active Problem List   Diagnosis Date Noted  . NSTEMI (non-ST elevated myocardial infarction) ([facility]) 07/24/2017    Past Surgical History:  Procedure Laterality Date  . CORONARY STENT INTERVENTION N/A 07/25/2017   Procedure: CORONARY STENT INTERVENTION;  Surgeon: Alwyn Pea, MD;  Location: [facility] INV[facility];  Service: Cardiovascular;  Laterality: N/A;  . LEFT HEART CATH AND CORONARY ANGIOGRAPHY N/A 07/25/2017   Procedure: LEFT HEART CATH AND CORONARY ANGIOGRAPHY;  Surgeon: Lamar Blinks, MD;  Location: [facility] IN[facility];  Service: Cardiovascular;  Laterality: N/A;    Prior to Admission medications   Medication Sig Start Date End Date Taking? Authorizing Provider  allopurinol (ZYLOPRIM) 300 MG tablet Take 300 mg by mouth daily. 03/22/18   [provider]  apixaban (ELIQUIS) 5 MG TABS tablet Take 5 mg by mouth 2 (two) times a day. 07/10/18   [provider]  aspirin 81 MG chewable tablet Chew 1  tablet (81 mg total) by mouth daily. 07/27/17   Altamese Dilling, MD  atorvastatin (LIPITOR) 80 MG tablet Take 1 tablet (80 mg total) by mouth daily at 6 PM. 07/26/17   Altamese Dilling, MD  carvedilol (COREG) 12.5 MG tablet Take 12.5 mg by mouth 2 (two) times a day. 03/21/18   [provider]  cyclobenzaprine (FLEXERIL) 10 MG tablet Take 1 tablet (10 mg total) by mouth 3 (three) times daily as needed for muscle spasms. 12/06/18   Nita Sickle, MD  famotidine (PEPCID) 20 MG tablet Take 1 tablet (20 mg total) by mouth 2 (two) times daily. 10/12/18   Sharman Cheek, MD  furosemide (LASIX) 20 MG tablet Take 20 mg by mouth daily. 03/21/18   [provider]  lisinopril (PRINIVIL,ZESTRIL) 10 MG tablet Take 1 tablet (10 mg total) by mouth daily. 07/26/17 10/12/18  Altamese Dilling, MD  Magnesium Oxide 400 (240 Mg) MG TABS Take 400 mg by mouth 2 (two) times a day. 07/11/18   [provider]  metoprolol succinate (TOPROL XL) 25 MG 24 hr tablet Take 1 tablet (25 mg total) by mouth daily. 07/26/17 10/12/18  Altamese Dilling, MD  naproxen (NAPROSYN) 500 MG tablet Take 1 tablet (500 mg total) by mouth 2 (two) times daily with a meal. 10/12/18   Sharman Cheek, MD  nitroGLYCERIN (NITROSTAT) 0.4 MG SL tablet Place 1 tablet (0.4 mg total) under the tongue every 5 (five) minutes as needed for chest pain. 09/26/18   Phineas Semen, MD  potassium chloride SA (K-DUR) 20 MEQ  tablet Take 20 mEq by mouth daily. 07/11/18   [provider]  tamsulosin (FLOMAX) 0.4 MG CAPS capsule Take 0.4 mg by mouth daily. 03/22/18   [provider]    Allergies Penicillins  Family History  Problem Relation Age of Onset  . CAD Mother   . CAD Father     Social History Social History   Tobacco Use  . Smoking status: Current Every Day Smoker    Packs/day: 0.50    Types: Cigarettes    Last attempt to quit: 08/10/2017    Years since quitting: 1.8  . Smokeless tobacco:  Never Used  Substance Use Topics  . Alcohol use: Not Currently  . Drug use: No    Review of Systems  Constitutional: Negative for fever. Cardiovascular: Negative for chest pain. Respiratory: Negative for shortness of breath. Musculoskeletal: Negative for back pain.  Right knee pain and swelling as above. Skin: Negative for rash. Neurological: Negative for headaches, focal weakness or numbness. ____________________________________________  PHYSICAL EXAM:  VITAL SIGNS: ED Triage Vitals [06/13/19 1557]  Enc Vitals Group     BP 105/76     Pulse Rate 74     Resp 16     Temp 98.8 F (37.1 C)     Temp Source Oral     SpO2 97 %     Weight 165 lb (74.8 kg)     Height 5\' 7"  (1.702 m)     Head Circumference      Peak Flow      Pain Score 10     Pain Loc      Pain Edu?      Excl. in GC?     Constitutional: Alert and oriented. Well appearing and in no distress. Head: Normocephalic and atraumatic. Eyes: Conjunctivae are normal. Normal extraocular movements Cardiovascular: Normal rate, regular rhythm.  Systolic murmur noted.  Normal distal pulses. Respiratory: Normal respiratory effort. No wheezes/rales/rhonchi. Gastrointestinal: Soft and nontender. No distention. Musculoskeletal: Right knee with obvious effusion, overlying skin is erythematous and the knee joint is visibly tender to palpation.  Normal patella tracking on exam.  Nontender with normal range of motion in all extremities.  Neurologic: Normal speech and language. No gross focal neurologic deficits are appreciated. Skin:  Skin is warm, dry and intact. No rash noted. ____________________________________________  PROCEDURES  Colchicine 0.6 mg p.o. Hydrocodone 5-325 mg p.o.  Procedures ____________________________________________  INITIAL IMPRESSION / ASSESSMENT AND PLAN / ED COURSE  Patient with a history of gout and hypertension, presents to the ED with acute right knee pain and swelling.  Patient describes  onset 2 days ago after eating a seafood meal.  Patient also reports that he has been out of his home his medicines for about a week.  Is been unable to establish refills with his primary provider at [facility]  community clinic.  As a courtesy we will refill the patient's anticoagulation and hypertensive medications, for a 30-day supply.  Travis Maxwell was evaluated in Emergency Department on 06/13/2019 for the symptoms described in the history of present illness. He was evaluated in the context of the global COVID-19 pandemic, which necessitated consideration that the patient might be at risk for infection with the SARS-CoV-2 virus that causes COVID-19. Institutional protocols and algorithms that pertain to the evaluation of patients at risk for COVID-19 are in a state of rapid change based on information released by regulatory bodies including the CDC and federal and state organizations. These policies and algorithms were followed during the  patient's care in the ED.  I reviewed the patient's prescription history over the last 12 months in the multi-state controlled substances database(s) that includes [place], [place], [place], [place], [place], [place], [place], [place], [place], [place], [place], [place], [place], and [place].  Results were notable for no current RX. ____________________________________________  FINAL CLINICAL IMPRESSION(S) / ED DIAGNOSES  Final diagnoses:  Acute idiopathic gout of left knee  Effusion of left knee      Carmie End, Dannielle Karvonen, PA-C 06/13/19 1705    Earleen Newport, MD 06/13/19 1727

## 2019-06-13 NOTE — Discharge Instructions (Addendum)
Take the prescription meds as directed. Follow-up with your provider for refills of your daily medicines. Return to the ED as needed.

## 2019-08-13 ENCOUNTER — Emergency Department: Payer: Medicare HMO

## 2019-08-13 ENCOUNTER — Observation Stay
Admission: EM | Admit: 2019-08-13 | Discharge: 2019-08-14 | Disposition: A | Discharge status: Home / Self Care | Payer: Medicare HMO | Attending: Internal Medicine | Admitting: Internal Medicine

## 2019-08-13 ENCOUNTER — Encounter: Payer: Self-pay | Admitting: Emergency Medicine

## 2019-08-13 ENCOUNTER — Other Ambulatory Visit: Payer: Self-pay

## 2019-08-13 ENCOUNTER — Inpatient Hospital Stay
Admit: 2019-08-13 | Discharge: 2019-08-13 | Disposition: A | Discharge status: Home / Self Care | Payer: Medicare HMO | Attending: Internal Medicine | Admitting: Internal Medicine

## 2019-08-13 DIAGNOSIS — R079 Chest pain, unspecified: Secondary | ICD-10-CM | POA: Diagnosis not present

## 2019-08-13 DIAGNOSIS — Z880 Allergy status to penicillin: Secondary | ICD-10-CM | POA: Insufficient documentation

## 2019-08-13 DIAGNOSIS — Z79899 Other long term (current) drug therapy: Secondary | ICD-10-CM | POA: Diagnosis not present

## 2019-08-13 DIAGNOSIS — K219 Gastro-esophageal reflux disease without esophagitis: Secondary | ICD-10-CM | POA: Diagnosis not present

## 2019-08-13 DIAGNOSIS — I083 Combined rheumatic disorders of mitral, aortic and tricuspid valves: Secondary | ICD-10-CM | POA: Insufficient documentation

## 2019-08-13 DIAGNOSIS — Z20822 Contact with and (suspected) exposure to covid-19: Secondary | ICD-10-CM | POA: Diagnosis not present

## 2019-08-13 DIAGNOSIS — Z7982 Long term (current) use of aspirin: Secondary | ICD-10-CM | POA: Diagnosis not present

## 2019-08-13 DIAGNOSIS — M109 Gout, unspecified: Secondary | ICD-10-CM | POA: Diagnosis present

## 2019-08-13 DIAGNOSIS — Z720 Tobacco use: Secondary | ICD-10-CM | POA: Diagnosis present

## 2019-08-13 DIAGNOSIS — Z952 Presence of prosthetic heart valve: Secondary | ICD-10-CM | POA: Insufficient documentation

## 2019-08-13 DIAGNOSIS — I110 Hypertensive heart disease with heart failure: Secondary | ICD-10-CM | POA: Insufficient documentation

## 2019-08-13 DIAGNOSIS — Z955 Presence of coronary angioplasty implant and graft: Secondary | ICD-10-CM | POA: Diagnosis not present

## 2019-08-13 DIAGNOSIS — Z7901 Long term (current) use of anticoagulants: Secondary | ICD-10-CM | POA: Insufficient documentation

## 2019-08-13 DIAGNOSIS — R42 Dizziness and giddiness: Secondary | ICD-10-CM

## 2019-08-13 DIAGNOSIS — I10 Essential (primary) hypertension: Secondary | ICD-10-CM | POA: Diagnosis present

## 2019-08-13 DIAGNOSIS — I420 Dilated cardiomyopathy: Secondary | ICD-10-CM | POA: Diagnosis not present

## 2019-08-13 DIAGNOSIS — I2510 Atherosclerotic heart disease of native coronary artery without angina pectoris: Secondary | ICD-10-CM | POA: Diagnosis not present

## 2019-08-13 DIAGNOSIS — F17210 Nicotine dependence, cigarettes, uncomplicated: Secondary | ICD-10-CM | POA: Insufficient documentation

## 2019-08-13 DIAGNOSIS — I5022 Chronic systolic (congestive) heart failure: Secondary | ICD-10-CM | POA: Diagnosis not present

## 2019-08-13 DIAGNOSIS — I214 Non-ST elevation (NSTEMI) myocardial infarction: Principal | ICD-10-CM | POA: Diagnosis present

## 2019-08-13 DIAGNOSIS — Z8249 Family history of ischemic heart disease and other diseases of the circulatory system: Secondary | ICD-10-CM | POA: Diagnosis not present

## 2019-08-13 DIAGNOSIS — I5023 Acute on chronic systolic (congestive) heart failure: Secondary | ICD-10-CM | POA: Diagnosis present

## 2019-08-13 DIAGNOSIS — E119 Type 2 diabetes mellitus without complications: Secondary | ICD-10-CM

## 2019-08-13 HISTORY — DX: Presence of prosthetic heart valve: Z95.2

## 2019-08-13 HISTORY — DX: Chronic systolic (congestive) heart failure: I50.22

## 2019-08-13 HISTORY — DX: Gastro-esophageal reflux disease without esophagitis: K21.9

## 2019-08-13 HISTORY — DX: Atherosclerotic heart disease of native coronary artery without angina pectoris: I25.10

## 2019-08-13 LAB — PROTIME-INR
INR: 1.2 (ref 0.8–1.2)
Prothrombin Time: 14.6 seconds (ref 11.4–15.2)

## 2019-08-13 LAB — URINALYSIS, COMPLETE (UACMP) WITH MICROSCOPIC
Bacteria, UA: NONE SEEN
Bilirubin Urine: NEGATIVE
Glucose, UA: NEGATIVE mg/dL
Hgb urine dipstick: NEGATIVE
Ketones, ur: NEGATIVE mg/dL
Leukocytes,Ua: NEGATIVE
Nitrite: NEGATIVE
Protein, ur: 30 mg/dL — AB
Specific Gravity, Urine: 1.021 (ref 1.005–1.030)
Squamous Epithelial / HPF: NONE SEEN (ref 0–5)
pH: 6 (ref 5.0–8.0)

## 2019-08-13 LAB — HEPATIC FUNCTION PANEL
ALT: 19 U/L (ref 0–44)
AST: 21 U/L (ref 15–41)
Albumin: 3.9 g/dL (ref 3.5–5.0)
Alkaline Phosphatase: 68 U/L (ref 38–126)
Bilirubin, Direct: <0.1 mg/dL (ref 0.0–0.2)
Total Bilirubin: 0.6 mg/dL (ref 0.3–1.2)
Total Protein: 7.3 g/dL (ref 6.5–8.1)

## 2019-08-13 LAB — CBC
HCT: 52.9 % — ABNORMAL HIGH (ref 39.0–52.0)
Hemoglobin: 15.8 g/dL (ref 13.0–17.0)
MCH: 19.3 pg — ABNORMAL LOW (ref 26.0–34.0)
MCHC: 29.9 g/dL — ABNORMAL LOW (ref 30.0–36.0)
MCV: 64.5 fL — ABNORMAL LOW (ref 80.0–100.0)
Platelets: 771 10*3/uL — ABNORMAL HIGH (ref 150–400)
RBC: 8.2 MIL/uL — ABNORMAL HIGH (ref 4.22–5.81)
RDW: 23.9 % — ABNORMAL HIGH (ref 11.5–15.5)
WBC: 11 10*3/uL — ABNORMAL HIGH (ref 4.0–10.5)
nRBC: 0 % (ref 0.0–0.2)

## 2019-08-13 LAB — TROPONIN I (HIGH SENSITIVITY)
Troponin I (High Sensitivity): 730 ng/L (ref <18)
Troponin I (High Sensitivity): 737 ng/L (ref <18)
Troponin I (High Sensitivity): 740 ng/L (ref <18)
Troponin I (High Sensitivity): 802 ng/L (ref <18)

## 2019-08-13 LAB — BASIC METABOLIC PANEL
Anion gap: 8 (ref 5–15)
BUN: 20 mg/dL (ref 8–23)
CO2: 25 mmol/L (ref 22–32)
Calcium: 9 mg/dL (ref 8.9–10.3)
Chloride: 107 mmol/L (ref 98–111)
Creatinine, Ser: 0.9 mg/dL (ref 0.61–1.24)
GFR calc Af Amer: >60 mL/min (ref >60)
GFR calc non Af Amer: >60 mL/min (ref >60)
Glucose, Bld: 118 mg/dL — ABNORMAL HIGH (ref 70–99)
Potassium: 4.1 mmol/L (ref 3.5–5.1)
Sodium: 140 mmol/L (ref 135–145)

## 2019-08-13 LAB — URINE DRUG SCREEN, QUALITATIVE (ARMC ONLY)
Amphetamines, Ur Screen: NOT DETECTED
Barbiturates, Ur Screen: NOT DETECTED
Benzodiazepine, Ur Scrn: NOT DETECTED
Cannabinoid 50 Ng, Ur ~~LOC~~: POSITIVE — AB
Cocaine Metabolite,Ur ~~LOC~~: NOT DETECTED
MDMA (Ecstasy)Ur Screen: NOT DETECTED
Methadone Scn, Ur: NOT DETECTED
Opiate, Ur Screen: NOT DETECTED
Phencyclidine (PCP) Ur S: NOT DETECTED
Tricyclic, Ur Screen: NOT DETECTED

## 2019-08-13 LAB — HIV ANTIBODY (ROUTINE TESTING W REFLEX): HIV Screen 4th Generation wRfx: NONREACTIVE

## 2019-08-13 LAB — GLUCOSE, CAPILLARY
Glucose-Capillary: 85 mg/dL (ref 70–99)
Glucose-Capillary: 95 mg/dL (ref 70–99)

## 2019-08-13 LAB — APTT: aPTT: 40 seconds — ABNORMAL HIGH (ref 24–36)

## 2019-08-13 LAB — BRAIN NATRIURETIC PEPTIDE: B Natriuretic Peptide: 379.6 pg/mL — ABNORMAL HIGH (ref 0.0–100.0)

## 2019-08-13 LAB — HEPARIN LEVEL (UNFRACTIONATED)
Heparin Unfractionated: <0.1 IU/mL — ABNORMAL LOW (ref 0.30–0.70)
Heparin Unfractionated: 0.19 IU/mL — ABNORMAL LOW (ref 0.30–0.70)

## 2019-08-13 LAB — SARS CORONAVIRUS 2 BY RT PCR (HOSPITAL ORDER, PERFORMED IN ~~LOC~~ HOSPITAL LAB): SARS Coronavirus 2: NEGATIVE

## 2019-08-13 LAB — LIPASE, BLOOD: Lipase: 27 U/L (ref 11–51)

## 2019-08-13 MED ORDER — MORPHINE SULFATE (PF) 2 MG/ML IV SOLN: 1.0000 mg | INTRAVENOUS | Status: DC | PRN

## 2019-08-13 MED ORDER — ASPIRIN 81 MG PO CHEW
324.0000 mg | CHEWABLE_TABLET | Freq: Once | ORAL | Status: AC
  Administered 2019-08-13: 324 mg via ORAL
  Filled 2019-08-13: qty 4

## 2019-08-13 MED ORDER — ASPIRIN EC 81 MG PO TBEC
81.0000 mg | DELAYED_RELEASE_TABLET | Freq: Every day | ORAL | Status: DC
  Administered 2019-08-14: 81 mg via ORAL
  Filled 2019-08-13: qty 1

## 2019-08-13 MED ORDER — MORPHINE SULFATE (PF) 4 MG/ML IV SOLN: 4.0000 mg | INTRAVENOUS | Status: DC | PRN

## 2019-08-13 MED ORDER — HEPARIN BOLUS VIA INFUSION
2000.0000 [IU] | Freq: Once | INTRAVENOUS | Status: AC
  Administered 2019-08-13: 2000 [IU] via INTRAVENOUS
  Filled 2019-08-13: qty 2000

## 2019-08-13 MED ORDER — SODIUM CHLORIDE 0.9 % IV BOLUS
500.0000 mL | Freq: Once | INTRAVENOUS | Status: AC
  Administered 2019-08-13: 500 mL via INTRAVENOUS

## 2019-08-13 MED ORDER — NITROGLYCERIN 2 % TD OINT
1.0000 [in_us] | TOPICAL_OINTMENT | Freq: Four times a day (QID) | TRANSDERMAL | Status: DC
  Administered 2019-08-13 – 2019-08-14 (×3): 1 [in_us] via TOPICAL
  Filled 2019-08-13 (×3): qty 1

## 2019-08-13 MED ORDER — PERFLUTREN LIPID MICROSPHERE
1.0000 mL | INTRAVENOUS | Status: AC | PRN
  Administered 2019-08-13: 2 mL via INTRAVENOUS
  Filled 2019-08-13: qty 10

## 2019-08-13 MED ORDER — HEPARIN (PORCINE) 25000 UT/250ML-% IV SOLN
1050.0000 [IU]/h | INTRAVENOUS | Status: DC
  Administered 2019-08-13 (×2): 850 [IU]/h via INTRAVENOUS
  Filled 2019-08-13: qty 250

## 2019-08-13 MED ORDER — SODIUM CHLORIDE 0.9 % IV SOLN: INTRAVENOUS | Status: DC

## 2019-08-13 MED ORDER — INSULIN ASPART 100 UNIT/ML ~~LOC~~ SOLN: 0.0000 [IU] | SUBCUTANEOUS | Status: DC

## 2019-08-13 MED ORDER — SODIUM CHLORIDE 0.9 % IV BOLUS: 1000.0000 mL | Freq: Once | INTRAVENOUS | Status: DC

## 2019-08-13 MED ORDER — ATORVASTATIN CALCIUM 20 MG PO TABS
40.0000 mg | ORAL_TABLET | Freq: Every day | ORAL | Status: DC
  Administered 2019-08-13 – 2019-08-14 (×2): 40 mg via ORAL
  Filled 2019-08-13 (×2): qty 2

## 2019-08-13 MED ORDER — ACETAMINOPHEN 325 MG PO TABS
650.0000 mg | ORAL_TABLET | Freq: Four times a day (QID) | ORAL | Status: DC | PRN
  Filled 2019-08-13: qty 2

## 2019-08-13 MED ORDER — HYDRALAZINE HCL 20 MG/ML IJ SOLN: 5.0000 mg | INTRAMUSCULAR | Status: DC | PRN

## 2019-08-13 MED ORDER — ONDANSETRON HCL 4 MG/2ML IJ SOLN
4.0000 mg | Freq: Once | INTRAMUSCULAR | Status: AC
  Administered 2019-08-13: 4 mg via INTRAVENOUS
  Filled 2019-08-13: qty 2

## 2019-08-13 MED ORDER — NITROGLYCERIN 0.4 MG SL SUBL
0.4000 mg | SUBLINGUAL_TABLET | SUBLINGUAL | Status: DC | PRN
  Administered 2019-08-13: 0.4 mg via SUBLINGUAL
  Filled 2019-08-13: qty 1

## 2019-08-13 MED ORDER — HEPARIN BOLUS VIA INFUSION
4000.0000 [IU] | Freq: Once | INTRAVENOUS | Status: AC
  Administered 2019-08-13: 4000 [IU] via INTRAVENOUS
  Filled 2019-08-13: qty 4000

## 2019-08-13 MED ORDER — CARVEDILOL 12.5 MG PO TABS
12.5000 mg | ORAL_TABLET | Freq: Two times a day (BID) | ORAL | Status: DC
  Administered 2019-08-13 – 2019-08-14 (×2): 12.5 mg via ORAL
  Filled 2019-08-13 (×2): qty 1

## 2019-08-13 MED ORDER — LISINOPRIL 10 MG PO TABS
10.0000 mg | ORAL_TABLET | Freq: Every day | ORAL | Status: DC
  Administered 2019-08-13 – 2019-08-14 (×2): 10 mg via ORAL
  Filled 2019-08-13 (×2): qty 1

## 2019-08-13 MED ORDER — ALLOPURINOL 300 MG PO TABS
300.0000 mg | ORAL_TABLET | Freq: Every day | ORAL | Status: DC
  Administered 2019-08-14: 300 mg via ORAL
  Filled 2019-08-13 (×2): qty 1

## 2019-08-13 MED ORDER — NICOTINE 21 MG/24HR TD PT24
21.0000 mg | MEDICATED_PATCH | Freq: Every day | TRANSDERMAL | Status: DC
  Administered 2019-08-13 – 2019-08-14 (×2): 21 mg via TRANSDERMAL
  Filled 2019-08-13 (×2): qty 1

## 2019-08-13 NOTE — Progress Notes (Signed)
Patient having chest pain 10/10 again.  Nitro paste applied to right upper arm.  Will continue to monitor.  VSS at this time.  Patient remains on [facility].
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
## 2019-08-13 NOTE — H&P (Signed)
History and Physical    Tykeem Lanzer LZJ:[id number] DOB: 02/27/1951 DOA: 08/13/2019  Referring MD/NP/PA:   PCP: [facility]   Patient coming from:  The patient is coming from home.  At baseline, pt is independent for most of ADL.        Chief Complaint: chest, dizziness  HPI: Kenny Rea is a 69 y.o. male with medical history significant of hypertension, diabetes mellitus, GERD, gout, tobacco abuse, CAD, stent placement, aortic valve replacement with bioprosthetic valve, sCHF with EF 35-40%, who presents with chest pain and dizziness.  Patient states that his chest has been going on for almost 2 days.  It is located in the substernal area, intermittent, dull, moderate, nonradiating.  Patient has mild cough, no shortness of breath, fever or chills.  Patient reported to ED physician that he had mild lower abdominal pain earlier, but denies abdominal pain to me.  No nausea, vomiting or diarrhea.  No symptoms of UTI.  Patient states that he has lightheadedness and dizziness in the past several days.  No unilateral numbness or tingling in extremities, no facial droop or slurred speech.  ED Course: pt was found to have troponin 730 -->737, WBC 11.0, pending Covid PCR, electrolytes renal function okay, temperature normal, blood pressure 129/70, heart rate of 49-72, RR 27, oxygen saturation 100% on room air.  Chest x-ray is negative.  Pending CT head.  Patient is admitted to progressive plan as inpatient  Review of Systems:   General: no fevers, chills, no body weight gain, has fatigue HEENT: no blurry vision, hearing changes or sore throat Respiratory: no dyspnea, has mild coughing, no wheezing CV: has chest pain, no palpitations GI: no nausea, vomiting, abdominal pain, diarrhea, constipation GU: no dysuria, burning on urination, increased urinary frequency, hematuria  Ext: no leg edema Neuro: no unilateral weakness, numbness, or tingling, no vision change or hearing  loss Skin: no rash, no skin tear. MSK: No muscle spasm, no deformity, no limitation of range of movement in spin Heme: No easy bruising.  Travel history: No recent long distant travel.  Allergy:  Allergies  Allergen Reactions  . Penicillins Other (See Comments)    Has patient had a PCN reaction causing immediate rash, facial/tongue/throat swelling, SOB or lightheadedness with hypotension: Unknown Has patient had a PCN reaction causing severe rash involving mucus membranes or skin necrosis: Unknown Has patient had a PCN reaction that required hospitalization: Unknown Has patient had a PCN reaction occurring within the last 10 years: No If all of the above answers are "NO", then may proceed with Cephalosporin use.     Past Medical History:  Diagnosis Date  . CAD (coronary artery disease)   . Chronic systolic (congestive) heart failure (HCC)   . Diabetes mellitus without complication (HCC)   . GERD (gastroesophageal reflux disease)   . Gout   . Hx of aortic valve replacement    Bioprosthetic valve  . Hypertension     Past Surgical History:  Procedure Laterality Date  . CORONARY STENT INTERVENTION N/A 07/25/2017   Procedure: CORONARY STENT INTERVENTION;  Surgeon: Alwyn Pea, MD;  Location: [facility] INVASIVE CV LAB;  Service: Cardiovascular;  Laterality: N/A;  . LEFT HEART CATH AND CORONARY ANGIOGRAPHY N/A 07/25/2017   Procedure: LEFT HEART CATH AND CORONARY ANGIOGRAPHY;  Surgeon: Lamar Blinks, MD;  Location: [facility] INVASIVE CV LAB;  Service: Cardiovascular;  Laterality: N/A;    Social History:  reports that he has been smoking cigarettes. He has been smoking about  0.50 packs per day. He has never used smokeless tobacco. He reports previous alcohol use. He reports that he does not use drugs.  Family History:  Family History  Problem Relation Age of Onset  . CAD Mother   . CAD Father      Prior to Admission medications   Medication Sig Start Date End Date Taking?  Authorizing Provider  magnesium oxide (MAG-OX) 400 MG tablet Take by mouth. 07/11/18  Yes [provider]  allopurinol (ZYLOPRIM) 300 MG tablet Take 300 mg by mouth daily. 03/22/18   [provider]  allopurinol (ZYLOPRIM) 300 MG tablet Take 1 tablet (300 mg total) by mouth daily. 06/13/19 07/13/19  Menshew, Dannielle Karvonen, PA-C  apixaban (ELIQUIS) 5 MG TABS tablet Take 5 mg by mouth 2 (two) times a day. 07/10/18   [provider]  apixaban (ELIQUIS) 5 MG TABS tablet Take 1 tablet (5 mg total) by mouth 2 (two) times daily. 06/13/19   Menshew, Dannielle Karvonen, PA-C  aspirin 81 MG chewable tablet Chew 1 tablet (81 mg total) by mouth daily. 07/27/17   Vaughan Basta, MD  atorvastatin (LIPITOR) 80 MG tablet Take 1 tablet (80 mg total) by mouth daily at 6 PM. 07/26/17   Vaughan Basta, MD  carvedilol (COREG) 12.5 MG tablet Take 12.5 mg by mouth 2 (two) times a day. 03/21/18   [provider]  carvedilol (COREG) 12.5 MG tablet Take 1 tablet (12.5 mg total) by mouth 2 (two) times daily. 06/13/19 07/13/19  Menshew, Dannielle Karvonen, PA-C  colchicine 0.6 MG tablet Take 2 tabs PO x 1, then 1 tab PO 1 hour later x 1 Max: 1.8 mg total dose per attack, do not repeat within 3 days. 06/13/19   Menshew, Dannielle Karvonen, PA-C  cyclobenzaprine (FLEXERIL) 10 MG tablet Take 1 tablet (10 mg total) by mouth 3 (three) times daily as needed for muscle spasms. 12/06/18   Rudene Re, MD  famotidine (PEPCID) 20 MG tablet Take 1 tablet (20 mg total) by mouth 2 (two) times daily. 10/12/18   Carrie Mew, MD  furosemide (LASIX) 20 MG tablet Take 20 mg by mouth daily. 03/21/18   [provider]  lisinopril (PRINIVIL,ZESTRIL) 10 MG tablet Take 1 tablet (10 mg total) by mouth daily. 07/26/17 10/12/18  Vaughan Basta, MD  lisinopril (ZESTRIL) 10 MG tablet Take 1 tablet (10 mg total) by mouth daily. 06/13/19 07/13/19  Menshew, Dannielle Karvonen, PA-C  Magnesium Oxide 400 (240 Mg) MG TABS  Take 400 mg by mouth 2 (two) times a day. 07/11/18   [provider]  metoprolol succinate (TOPROL XL) 25 MG 24 hr tablet Take 1 tablet (25 mg total) by mouth daily. 07/26/17 10/12/18  Vaughan Basta, MD  naproxen (NAPROSYN) 500 MG tablet Take 1 tablet (500 mg total) by mouth 2 (two) times daily with a meal. 10/12/18   Carrie Mew, MD  nitroGLYCERIN (NITROSTAT) 0.4 MG SL tablet Place 1 tablet (0.4 mg total) under the tongue every 5 (five) minutes as needed for chest pain. 09/26/18   Nance Pear, MD  potassium chloride SA (K-DUR) 20 MEQ tablet Take 20 mEq by mouth daily. 07/11/18   [provider]  tamsulosin (FLOMAX) 0.4 MG CAPS capsule Take 0.4 mg by mouth daily. 03/22/18   [provider]    Physical Exam: Vitals:   08/13/19 0834 08/13/19 0900 08/13/19 0930 08/13/19 1000  BP: 134/90 127/86 129/70 118/76  Pulse: 75 (!) 49 72   Resp: 19  20 (!) 27 (!) 22  Temp:      TempSrc:      SpO2:      Weight:      Height:       General: Not in acute distress HEENT:       Eyes: PERRL, EOMI, no scleral icterus.       ENT: No discharge from the ears and nose, no pharynx injection, no tonsillar enlargement.        Neck: No JVD, no bruit, no mass felt. Heme: No neck lymph node enlargement. Cardiac: S1/S2, RRR, No murmurs, No gallops or rubs. Respiratory: No rales, wheezing, rhonchi or rubs. GI: Soft, nondistended, nontender, no rebound pain, no organomegaly, BS present. GU: No hematuria Ext: No pitting leg edema bilaterally. 2+DP/PT pulse bilaterally. Musculoskeletal: No joint deformities, No joint redness or warmth, no limitation of ROM in spin. Skin: No rashes.  Neuro: Alert, oriented X3, cranial nerves II-XII grossly intact, moves all extremities normally. Psych: Patient is not psychotic, no suicidal or hemocidal ideation.  Labs on Admission: I have personally reviewed following labs and imaging studies  CBC: Recent Labs  Lab 08/13/19 0836  WBC 11.0*   HGB 15.8  HCT 52.9*  MCV 64.5*  PLT 771*   Basic Metabolic Panel: Recent Labs  Lab 08/13/19 0836  NA 140  K 4.1  CL 107  CO2 25  GLUCOSE 118*  BUN 20  CREATININE 0.90  CALCIUM 9.0   GFR: Estimated Creatinine Clearance: 74.5 mL/min (by C-G formula based on SCr of 0.9 mg/dL). Liver Function Tests: Recent Labs  Lab 08/13/19 0836  AST 21  ALT 19  ALKPHOS 68  BILITOT 0.6  PROT 7.3  ALBUMIN 3.9   Recent Labs  Lab 08/13/19 0836  LIPASE 27   No results for input(s): AMMONIA in the last 168 hours. Coagulation Profile: No results for input(s): INR, PROTIME in the last 168 hours. Cardiac Enzymes: No results for input(s): CKTOTAL, CKMB, CKMBINDEX, TROPONINI in the last 168 hours. BNP (last 3 results) No results for input(s): PROBNP in the last 8760 hours. HbA1C: No results for input(s): HGBA1C in the last 72 hours. CBG: No results for input(s): GLUCAP in the last 168 hours. Lipid Profile: No results for input(s): CHOL, HDL, LDLCALC, TRIG, CHOLHDL, LDLDIRECT in the last 72 hours. Thyroid Function Tests: No results for input(s): TSH, T4TOTAL, FREET4, T3FREE, THYROIDAB in the last 72 hours. Anemia Panel: No results for input(s): VITAMINB12, FOLATE, FERRITIN, TIBC, IRON, RETICCTPCT in the last 72 hours. Urine analysis: No results found for: COLORURINE, APPEARANCEUR, LABSPEC, PHURINE, GLUCOSEU, HGBUR, BILIRUBINUR, KETONESUR, PROTEINUR, UROBILINOGEN, NITRITE, LEUKOCYTESUR Sepsis Labs: @LABRCNTIP (procalcitonin:4,lacticidven:4) )No results found for this or any previous visit (from the past 240 hour(s)).   Radiological Exams on Admission: DG Chest Portable 1 View  Result Date: 08/13/2019 CLINICAL DATA:  Weakness EXAM: PORTABLE CHEST 1 VIEW COMPARISON:  08/14/2017 FINDINGS: Stable cardiomegaly. Interval median sternotomy with prosthetic cardiac valve. No focal airspace consolidation, pleural effusion, or pneumothorax. 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 No new or acute osseous findings are evident. IMPRESSION: No active disease. Stable cardiomegaly. Electronically Signed   By: 10/14/2017 D.O.   On: 08/13/2019 09:46     EKG: Independently reviewed.  Sinus rhythm, QTC 516, right bundle blockade, LAD, poor R wave progression   Assessment/Plan Principal Problem:   Chest pain Active Problems:   NSTEMI (non-ST elevated myocardial infarction) (HCC)   Diabetes mellitus without complication (HCC)  Gout   Hypertension   Dizziness   Chronic systolic CHF (congestive heart failure) (HCC)   Tobacco abuse   CAD (coronary artery disease)   Chest pain and NSTEMI and hx of CAD:  trop 730 -->737.  Dr. Gwen Pounds of cardiology is consulted.   - admit to progressive unit as inpatient - on IV heparin - Trend Trop - Repeat EKG in the am  - prn Nitroglycerin, Morphine, and aspirin, lipitor  - Risk factor stratification: will check FLP and A1C  - check UDS - 2d echo  Diet controled diabetes mellitus without complication (HCC): A1c 5.6 on 07/25/17, well controled.  Blood sugar 118 -check CBG qAM  Gout -Allopurinol  Hypertension -Lisinopril, Coreg -IV hydralazine as needed  Dizziness: Etiology is not clear.  CT head is negative for acute intracranial abnormality.  No focal neuro deficit on physical examination. -PT/OT -500 cc normal saline -if not improving, may get MRI to rule out posterior circulation stroke  Chronic systolic CHF (congestive heart failure) (HCC): 2D echo on 07/25/2017 showed EF 25-30%.  No leg edema JVD.  CHF is compensated. -Check BNP -Continue Coreg and lisinopril  Tobacco abuse -Nicotine patch  Eliquis use: pt is on Eliquis, but he is not why he is on it.  -pt is on IV heparin now    DVT ppx: on IV Heparin    Code Status: Full code Family Communication: not done, no family member is at bed side.    Disposition Plan:  Anticipate discharge back to previous  environment Consults called:  Dr. Gwen Pounds of cardiology Admission status:  progressive unit as inpt      Status is: Inpatient  Remains inpatient appropriate because:Inpatient level of care appropriate due to severity of illness.  Patient has multiple comorbidities, now presents with chest pain non-STEMI.  Patient also has dizziness and lightheadedness.  His presentation is highly complicated.  Patient is at high risk of deteriorating.  Will need to be treated in hospital for at least 2 days.   Dispo: The patient is from: Home              Anticipated d/c is to: Home              Anticipated d/c date is: 2 days              Patient currently is not medically stable to d/c.           Date of Service 08/13/2019    Lorretta Harp [facility]   If 7PM-7AM, please contact night-coverage [email or web address] 08/13/2019, 10:32 AM

## 2019-08-13 NOTE — ED Notes (Signed)
Dr Niu at bedside 

## 2019-08-13 NOTE — Consult Note (Signed)
ANTICOAGULATION CONSULT NOTE - Initial Consult  Pharmacy Consult for Heparin Indication: chest pain/ACS/STEMI  Allergies  Allergen Reactions  . Penicillins Other (See Comments)    Has patient had a PCN reaction causing immediate rash, facial/tongue/throat swelling, SOB or lightheadedness with hypotension: Unknown Has patient had a PCN reaction causing severe rash involving mucus membranes or skin necrosis: Unknown Has patient had a PCN reaction that required hospitalization: Unknown Has patient had a PCN reaction occurring within the last 10 years: No If all of the above answers are "NO", then may proceed with Cephalosporin use.     Patient Measurements: Height: 5\' 7"  (170.2 cm) Weight: 72.6 kg (160 lb) IBW/kg (Calculated) : 66.1 Heparin Dosing Weight: 72.6kg  Vital Signs: Temp: 98 F (36.7 C) (06/08 0815) Temp Source: Oral (06/08 0815) BP: 129/70 (06/08 0930) Pulse Rate: 72 (06/08 0930)  Labs: Recent Labs    08/13/19 0836  HGB 15.8  HCT 52.9*  PLT 771*  CREATININE 0.90  TROPONINIHS 730*    Estimated Creatinine Clearance: 74.5 mL/min (by C-G formula based on SCr of 0.9 mg/dL).   Medical History: Past Medical History:  Diagnosis Date  . Diabetes mellitus without complication (HCC)   . Gout   . Hypertension     Medications:  (Not in a hospital admission)  Scheduled:  . ondansetron (ZOFRAN) IV  4 mg Intravenous Once   Infusions:   PRN: morphine injection Anti-infectives (From admission, onward)   None      Assessment: Patient is a 69yo male presenting to the ED with dizziness and abdominal pain. Patient reports taking last Eliquis dose two days ago.  Pharmacy has been consulted for heparin initiation.     Goal of Therapy:  Heparin level 0.3-0.7 units/ml Monitor platelets by anticoagulation protocol: Yes   Plan:  Give 4000 units bolus x 1 Start heparin infusion at 850 units/hr Check anti-Xa level in 6 hours and daily while on heparin Continue to  monitor H&H and platelets  Josefita Weissmann  PharmD-Student  08/13/2019,10:13 AM

## 2019-08-13 NOTE — Progress Notes (Signed)
Patient was having 10/10 chest pain.  1ntg given and patient stated chest pain went to 5/10.  EKG performed and patient put on [facility].  EKG is abnormal. Troponins are elevated.  Called and spoke to Dr. Gwen Pounds who stated he would look into seeing the patient.  Patient remains NPO at this time.

## 2019-08-13 NOTE — Consult Note (Signed)
[facility] Consultation Note  Patient ID: Travis Maxwell, MRN: [id number], DOB/AGE: 09-Oct-1950 69 y.o. Admit date: 08/13/2019   Date of Consult: 08/13/2019 Primary Physician: [facility] Primary Cardiologist: None  Chief Complaint:  Chief Complaint  Patient presents with  . Dizziness   Reason for Consult: Chest pain  HPI: 69 y.o. male with known coronary artery disease status post previous stenting and LV systolic dysfunction with chronic systolic dysfunction heart failure with ejection fraction of 25% hypertension hyperlipidemia diabetes abnormal EKG with aortic valve replacement in the past.  The patient apparently has significant shortness of breath and intermittent episodes of chest discomfort and was seen in the emergency room.  At that time the patient had EKG showing normal sinus rhythm with left axis deviation and right bundle branch block unchanged from his previous EKG in the long past.  He had a BNP of 397 and a troponin of 801.  This troponin level has not changed over the last several evaluations possibly consistent with heart failure and or demand ischemia.  Chest x-ray shows no evidence of congestive heart failure.  Currently the patient had an episode of chest discomfort which was relieved by nitrates and feels much better at this time.  Is unclear whether this is true and it ischemia due to no changes in EKG. Past Medical History:  Diagnosis Date  . CAD (coronary artery disease)   . Chronic systolic (congestive) heart failure (HCC)   . Diabetes mellitus without complication (HCC)   . GERD (gastroesophageal reflux disease)   . Gout   . Hx of aortic valve replacement    Bioprosthetic valve  . Hypertension       Surgical History:  Past Surgical History:  Procedure Laterality Date  . CORONARY STENT INTERVENTION N/A 07/25/2017   Procedure: CORONARY STENT INTERVENTION;  Surgeon: Alwyn Pea, MD;  Location: [facility] INVASIVE CV LAB;  Service:  Cardiovascular;  Laterality: N/A;  . LEFT HEART CATH AND CORONARY ANGIOGRAPHY N/A 07/25/2017   Procedure: LEFT HEART CATH AND CORONARY ANGIOGRAPHY;  Surgeon: Lamar Blinks, MD;  Location: [facility] INVASIVE CV LAB;  Service: Cardiovascular;  Laterality: N/A;     Home Meds: Prior to Admission medications   Medication Sig Start Date End Date Taking? Authorizing Provider  lisinopril (ZESTRIL) 10 MG tablet Take 1 tablet (10 mg total) by mouth daily. 06/13/19 08/13/19 Yes Menshew, Charlesetta Ivory, PA-C  allopurinol (ZYLOPRIM) 300 MG tablet Take 300 mg by mouth daily. 03/22/18   [provider]  apixaban (ELIQUIS) 5 MG TABS tablet Take 1 tablet (5 mg total) by mouth 2 (two) times daily. 06/13/19   Menshew, Charlesetta Ivory, PA-C  carvedilol (COREG) 12.5 MG tablet Take 12.5 mg by mouth 2 (two) times a day. 03/21/18   [provider]  colchicine 0.6 MG tablet Take 2 tabs PO x 1, then 1 tab PO 1 hour later x 1 Max: 1.8 mg total dose per attack, do not repeat within 3 days. 06/13/19   Menshew, Charlesetta Ivory, PA-C    Inpatient Medications:  . allopurinol  300 mg Oral Daily  . aspirin EC  81 mg Oral Daily  . carvedilol  12.5 mg Oral BID WC  . heparin  2,000 Units Intravenous Once  . insulin aspart  0-9 Units Subcutaneous Q4H  . lisinopril  10 mg Oral Daily  . nicotine  21 mg Transdermal Daily  . nitroGLYCERIN  1 inch Topical Q6H   . heparin 850 Units/hr (  08/13/19 1126)    Allergies:  Allergies  Allergen Reactions  . Penicillins Other (See Comments)    Has patient had a PCN reaction causing immediate rash, facial/tongue/throat swelling, SOB or lightheadedness with hypotension: Unknown Has patient had a PCN reaction causing severe rash involving mucus membranes or skin necrosis: Unknown Has patient had a PCN reaction that required hospitalization: Unknown Has patient had a PCN reaction occurring within the last 10 years: No If all of the above answers are "NO", then may proceed with  Cephalosporin use.     Social History   Socioeconomic History  . Marital status: Divorced    Spouse name: Not on file  . Number of children: Not on file  . Years of education: Not on file  . Highest education level: Not on file  Occupational History  . Not on file  Tobacco Use  . Smoking status: Current Every Day Smoker    Packs/day: 0.50    Types: Cigarettes    Last attempt to quit: 08/10/2017    Years since quitting: 2.0  . Smokeless tobacco: Never Used  Substance and Sexual Activity  . Alcohol use: Not Currently  . Drug use: No  . Sexual activity: Not on file  Other Topics Concern  . Not on file  Social History Narrative  . Not on file   Social Determinants of Health   Financial Resource Strain:   . Difficulty of Paying Living Expenses:   Food Insecurity:   . Worried About Programme researcher, broadcasting/film/video in the Last Year:   . Barista in the Last Year:   Transportation Needs:   . Freight forwarder (Medical):   Marland Kitchen Lack of Transportation (Non-Medical):   Physical Activity:   . Days of Exercise per Week:   . Minutes of Exercise per Session:   Stress:   . Feeling of Stress :   Social Connections:   . Frequency of Communication with Friends and Family:   . Frequency of Social Gatherings with Friends and Family:   . Attends Religious Services:   . Active Member of Clubs or Organizations:   . Attends Banker Meetings:   Marland Kitchen Marital Status:   Intimate Partner Violence:   . Fear of Current or Ex-Partner:   . Emotionally Abused:   Marland Kitchen Physically Abused:   . Sexually Abused:      Family History  Problem Relation Age of Onset  . CAD Mother   . CAD Father      Review of Systems Positive for chest pain shortness of breath Negative for: General:  chills, fever, night sweats or weight changes.  Cardiovascular: PND orthopnea syncope dizziness  Dermatological skin lesions rashes Respiratory: Cough congestion Urologic: Frequent urination urination at  night and hematuria Abdominal: negative for nausea, vomiting, diarrhea, bright red blood per rectum, melena, or hematemesis Neurologic: negative for visual changes, and/or hearing changes  All other systems reviewed and are otherwise negative except as noted above.  Labs: No results for input(s): CKTOTAL, CKMB, TROPONINI in the last 72 hours. Lab Results  Component Value Date   WBC 11.0 (H) 08/13/2019   HGB 15.8 08/13/2019   HCT 52.9 (H) 08/13/2019   MCV 64.5 (L) 08/13/2019   PLT 771 (H) 08/13/2019    Recent Labs  Lab 08/13/19 0836  NA 140  K 4.1  CL 107  CO2 25  BUN 20  CREATININE 0.90  CALCIUM 9.0  PROT 7.3  BILITOT 0.6  ALKPHOS 68  ALT 19  AST 21  GLUCOSE 118*   Lab Results  Component Value Date   CHOL 149 07/24/2017   HDL 40 (L) 07/24/2017   LDLCALC 98 07/24/2017   TRIG 55 07/24/2017   No results found for: DDIMER  Radiology/Studies:  CT Head Wo Contrast  Result Date: 08/13/2019 CLINICAL DATA:  Headache, dizziness EXAM: CT HEAD WITHOUT CONTRAST TECHNIQUE: Contiguous axial images were obtained from the base of the skull through the vertex without intravenous contrast. COMPARISON:  07/24/2017 FINDINGS: Brain: No evidence of acute infarction, hemorrhage, hydrocephalus, extra-axial collection or mass lesion/mass effect. Vascular: Atherosclerotic calcifications involving the large vessels of the skull base. No unexpected hyperdense vessel. Skull: Normal. Negative for fracture or focal lesion. Sinuses/Orbits: No acute finding. Other: None. IMPRESSION: No acute intracranial findings. Electronically Signed   By: Davina Poke D.O.   On: 08/13/2019 11:18   DG Chest Portable 1 View  Result Date: 08/13/2019 CLINICAL DATA:  Weakness EXAM: PORTABLE CHEST 1 VIEW COMPARISON:  08/14/2017 FINDINGS: Stable cardiomegaly. Interval median sternotomy with prosthetic cardiac valve. No focal airspace consolidation, pleural effusion, or pneumothorax. 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 No new or acute osseous findings are evident. IMPRESSION: No active disease. Stable cardiomegaly. Electronically Signed   By: Davina Poke D.O.   On: 08/13/2019 09:46    EKG: Normal sinus rhythm with left axis deviation right bundle branch block with preventricular contractions  Weights: Filed Weights   08/13/19 0816  Weight: 72.6 kg     Physical Exam: Blood pressure 119/86, pulse 65, temperature 98 F (36.7 C), resp. rate 18, height 5\' 7"  (1.702 m), weight 72.6 kg, SpO2 100 %. Body mass index is 25.06 kg/m. General: Well developed, well nourished, in no acute distress. Head eyes ears nose throat: Normocephalic, atraumatic, sclera non-icteric, no xanthomas, nares are without discharge. No apparent thyromegaly and/or mass  Lungs: Normal respiratory effort.  no wheezes, no rales, no rhonchi.  Heart: RRR with normal S1 S2.  Aortic murmur gallop, no rub, PMI is normal size and placement, carotid upstroke normal without bruit, jugular venous pressure is normal Abdomen: Soft, non-tender, non-distended with normoactive bowel sounds. No hepatomegaly. No rebound/guarding. No obvious abdominal masses. Abdominal aorta is normal size without bruit Extremities: Trace edema. no cyanosis, no clubbing, no ulcers  Peripheral : 2+ bilateral upper extremity pulses, 2+ bilateral femoral pulses, 2+ bilateral dorsal pedal pulse Neuro: Alert and oriented. No facial asymmetry. No focal deficit. Moves all extremities spontaneously. Musculoskeletal: Normal muscle tone without kyphosis Psych:  Responds to questions appropriately with a normal affect.    Assessment: 69 year old male with aortic valve replacement hypertension hyperlipidemia coronary artery disease status post coronary artery stenting chronic systolic dysfunction congestive heart failure with elevated BNP and troponin and intermittent chest pain concerning for possibility of cardiovascular  event and/or acute coronary syndrome  Plan: 1.  Continue serial ECG and enzymes to assess for acute coronary syndrome 2.  Reinstatement of medications that the patient has not been able to take in the past including carvedilol lisinopril for cardiomyopathy LV systolic dysfunction 3.  Nitrates for chest discomfort 4.  Further consideration of echocardiogram for LV systolic dysfunction valvular heart disease contributing to above 5.  Begin ambulation and follow-up for improvements of symptoms and possible adjustments of medications thereafter versus the possibility of stress test to evaluate for further myocardial ischemia  Signed, Corey Skains M.D. [facility] 08/13/2019, 6:27 PM

## 2019-08-13 NOTE — ED Notes (Signed)
Date and time results received: 08/13/19 1000   Test: troponin Critical Value: 730  Name of Provider Notified: Dr. Roxan Hockey

## 2019-08-13 NOTE — ED Notes (Signed)
Attempted to call receiving RN, was busy at this time. Left name and number, awaiting return call.

## 2019-08-13 NOTE — ED Provider Notes (Signed)
[facility] Emergency Department Provider Note    First MD Initiated Contact with Patient 08/13/19 [phone]     (approximate)  I have reviewed the triage vital signs and the nursing notes.   HISTORY  Chief Complaint Dizziness    HPI Travis Maxwell is a 69 y.o. male extensive past medical history presents to the ER for evaluation of lightheadedness is positional in nature associated with some chest discomfort for the past several days.  States he started having worsening lightheadedness today.  States he ran out of his blood pressure and home medications roughly 1 week ago and has not been taking them.  Denies any headache.  No fevers no.  No falls.  Denies any pain radiating through to his back.    Echo 2019: Left ventricle: The cavity size was moderately dilated. Systolic  function was severely reduced. The estimated ejection fraction  was in the range of 25% to 30%. Hypokinesis of the lateral  myocardium. Hypokinesis of the apical myocardium.  - Aortic valve: There was mild stenosis. There was mild  regurgitation. Valve area (VTI): 0.86 cm^2. Valve area (Vmax):  0.83 cm^2. Valve area (Vmean): 0.82 cm^2.  - Mitral valve: Calcified annulus. Mildly thickened leaflets .  There was moderate regurgitation.  - Left atrium: The atrium was mildly dilated.   Past Medical History:  Diagnosis Date  . CAD (coronary artery disease)   . Chronic systolic (congestive) heart failure (HCC)   . Diabetes mellitus without complication (HCC)   . GERD (gastroesophageal reflux disease)   . Gout   . Hx of aortic valve replacement    Bioprosthetic valve  . Hypertension    Family History  Problem Relation Age of Onset  . CAD Mother   . CAD Father    Past Surgical History:  Procedure Laterality Date  . CORONARY STENT INTERVENTION N/A 07/25/2017   Procedure: CORONARY STENT INTERVENTION;  Surgeon: Alwyn Pea, MD;  Location: [facility] INVASIVE CV LAB;   Service: Cardiovascular;  Laterality: N/A;  . LEFT HEART CATH AND CORONARY ANGIOGRAPHY N/A 07/25/2017   Procedure: LEFT HEART CATH AND CORONARY ANGIOGRAPHY;  Surgeon: Lamar Blinks, MD;  Location: [facility] INVASIVE CV LAB;  Service: Cardiovascular;  Laterality: N/A;   Patient Active Problem List   Diagnosis Date Noted  . Chest pain 08/13/2019  . Diabetes mellitus without complication (HCC)   . Gout   . Hypertension   . Dizziness   . Chronic systolic CHF (congestive heart failure) (HCC)   . Tobacco abuse   . GERD (gastroesophageal reflux disease)   . CAD (coronary artery disease)   . NSTEMI (non-ST elevated myocardial infarction) (HCC) 07/24/2017      Prior to Admission medications   Medication Sig Start Date End Date Taking? Authorizing Provider  magnesium oxide (MAG-OX) 400 MG tablet Take by mouth. 07/11/18  Yes [provider]  allopurinol (ZYLOPRIM) 300 MG tablet Take 300 mg by mouth daily. 03/22/18   [provider]  apixaban (ELIQUIS) 5 MG TABS tablet Take 5 mg by mouth 2 (two) times a day. 07/10/18   [provider]  apixaban (ELIQUIS) 5 MG TABS tablet Take 1 tablet (5 mg total) by mouth 2 (two) times daily. 06/13/19   Menshew, Charlesetta Ivory, PA-C  aspirin 81 MG chewable tablet Chew 1 tablet (81 mg total) by mouth daily. 07/27/17   Altamese Dilling, MD  atorvastatin (LIPITOR) 80 MG tablet Take 1 tablet (80 mg total) by mouth daily at 6  PM. 07/26/17   Altamese Dilling, MD  carvedilol (COREG) 12.5 MG tablet Take 12.5 mg by mouth 2 (two) times a day. 03/21/18   [provider]  carvedilol (COREG) 12.5 MG tablet Take 1 tablet (12.5 mg total) by mouth 2 (two) times daily. 06/13/19 07/13/19  Menshew, Charlesetta Ivory, PA-C  colchicine 0.6 MG tablet Take 2 tabs PO x 1, then 1 tab PO 1 hour later x 1 Max: 1.8 mg total dose per attack, do not repeat within 3 days. 06/13/19   Menshew, Charlesetta Ivory, PA-C  cyclobenzaprine (FLEXERIL) 10 MG tablet Take 1 tablet  (10 mg total) by mouth 3 (three) times daily as needed for muscle spasms. 12/06/18   Nita Sickle, MD  famotidine (PEPCID) 20 MG tablet Take 1 tablet (20 mg total) by mouth 2 (two) times daily. 10/12/18   Sharman Cheek, MD  furosemide (LASIX) 20 MG tablet Take 20 mg by mouth daily. 03/21/18   [provider]  lisinopril (PRINIVIL,ZESTRIL) 10 MG tablet Take 1 tablet (10 mg total) by mouth daily. 07/26/17 10/12/18  Altamese Dilling, MD  lisinopril (ZESTRIL) 10 MG tablet Take 1 tablet (10 mg total) by mouth daily. 06/13/19 07/13/19  Menshew, Charlesetta Ivory, PA-C  Magnesium Oxide 400 (240 Mg) MG TABS Take 400 mg by mouth 2 (two) times a day. 07/11/18   [provider]  metoprolol succinate (TOPROL XL) 25 MG 24 hr tablet Take 1 tablet (25 mg total) by mouth daily. 07/26/17 10/12/18  Altamese Dilling, MD  naproxen (NAPROSYN) 500 MG tablet Take 1 tablet (500 mg total) by mouth 2 (two) times daily with a meal. 10/12/18   Sharman Cheek, MD  nitroGLYCERIN (NITROSTAT) 0.4 MG SL tablet Place 1 tablet (0.4 mg total) under the tongue every 5 (five) minutes as needed for chest pain. 09/26/18   Phineas Semen, MD  potassium chloride SA (K-DUR) 20 MEQ tablet Take 20 mEq by mouth daily. 07/11/18   [provider]  tamsulosin (FLOMAX) 0.4 MG CAPS capsule Take 0.4 mg by mouth daily. 03/22/18   [provider]    Allergies Penicillins    Social History Social History   Tobacco Use  . Smoking status: Current Every Day Smoker    Packs/day: 0.50    Types: Cigarettes    Last attempt to quit: 08/10/2017    Years since quitting: 2.0  . Smokeless tobacco: Never Used  Substance Use Topics  . Alcohol use: Not Currently  . Drug use: No    Review of Systems Patient denies headaches, rhinorrhea, blurry vision, numbness, shortness of breath, chest pain, edema, cough, abdominal pain, nausea, vomiting, diarrhea, dysuria, fevers, rashes or hallucinations unless otherwise stated  above in HPI. ____________________________________________   PHYSICAL EXAM:  VITAL SIGNS: Vitals:   08/13/19 0930 08/13/19 1000  BP: 129/70 118/76  Pulse: 72   Resp: (!) 27 (!) 22  Temp:    SpO2:      Constitutional: Alert and oriented.  Eyes: Conjunctivae are normal.  Head: Atraumatic. Nose: No congestion/rhinnorhea. Mouth/Throat: Mucous membranes are moist.   Neck: No stridor. Painless ROM.  Cardiovascular: Normal rate, regular rhythm. Midsystolic murmur.  Good peripheral circulation. Respiratory: Normal respiratory effort.  No retractions. Lungs CTAB. Gastrointestinal: Soft and nontender. No distention. No abdominal bruits. No CVA tenderness. Genitourinary:  Musculoskeletal: No lower extremity tenderness nor edema.  No joint effusions. Neurologic:  Normal speech and language. No gross focal neurologic deficits are appreciated. No facial droop Skin:  Skin is warm, dry  and intact. No rash noted. Psychiatric: Mood and affect are normal. Speech and behavior are normal.  ____________________________________________   LABS (all labs ordered are listed, but only abnormal results are displayed)  Results for orders placed or performed during the hospital encounter of 08/13/19 (from the past 24 hour(s))  APTT     Status: Abnormal   Collection Time: 08/13/19  8:32 AM  Result Value Ref Range   aPTT 40 (H) 24 - 36 seconds  Protime-INR     Status: None   Collection Time: 08/13/19  8:32 AM  Result Value Ref Range   Prothrombin Time 14.6 11.4 - 15.2 seconds   INR 1.2 0.8 - 1.2  Basic metabolic panel     Status: Abnormal   Collection Time: 08/13/19  8:36 AM  Result Value Ref Range   Sodium 140 135 - 145 mmol/L   Potassium 4.1 3.5 - 5.1 mmol/L   Chloride 107 98 - 111 mmol/L   CO2 25 22 - 32 mmol/L   Glucose, Bld 118 (H) 70 - 99 mg/dL   BUN 20 8 - 23 mg/dL   Creatinine, Ser 1.01 0.61 - 1.24 mg/dL   Calcium 9.0 8.9 - 75.1 mg/dL   GFR calc non Af Amer >60 >60 mL/min   GFR calc  Af Amer >60 >60 mL/min   Anion gap 8 5 - 15  CBC     Status: Abnormal   Collection Time: 08/13/19  8:36 AM  Result Value Ref Range   WBC 11.0 (H) 4.0 - 10.5 K/uL   RBC 8.20 (H) 4.22 - 5.81 MIL/uL   Hemoglobin 15.8 13.0 - 17.0 g/dL   HCT 02.5 (H) 85.2 - 77.8 %   MCV 64.5 (L) 80.0 - 100.0 fL   MCH 19.3 (L) 26.0 - 34.0 pg   MCHC 29.9 (L) 30.0 - 36.0 g/dL   RDW 24.2 (H) 35.3 - 61.4 %   Platelets 771 (H) 150 - 400 K/uL   nRBC 0.0 0.0 - 0.2 %  Troponin I (High Sensitivity)     Status: Abnormal   Collection Time: 08/13/19  8:36 AM  Result Value Ref Range   Troponin I (High Sensitivity) 730 (HH) <18 ng/L  Hepatic function panel     Status: None   Collection Time: 08/13/19  8:36 AM  Result Value Ref Range   Total Protein 7.3 6.5 - 8.1 g/dL   Albumin 3.9 3.5 - 5.0 g/dL   AST 21 15 - 41 U/L   ALT 19 0 - 44 U/L   Alkaline Phosphatase 68 38 - 126 U/L   Total Bilirubin 0.6 0.3 - 1.2 mg/dL   Bilirubin, Direct <4.3 0.0 - 0.2 mg/dL   Indirect Bilirubin NOT CALCULATED 0.3 - 0.9 mg/dL  Lipase, blood     Status: None   Collection Time: 08/13/19  8:36 AM  Result Value Ref Range   Lipase 27 11 - 51 U/L  Urinalysis, Complete w Microscopic     Status: Abnormal   Collection Time: 08/13/19 10:33 AM  Result Value Ref Range   Color, Urine YELLOW (A) YELLOW   APPearance CLEAR (A) CLEAR   Specific Gravity, Urine 1.021 1.005 - 1.030   pH 6.0 5.0 - 8.0   Glucose, UA NEGATIVE NEGATIVE mg/dL   Hgb urine dipstick NEGATIVE NEGATIVE   Bilirubin Urine NEGATIVE NEGATIVE   Ketones, ur NEGATIVE NEGATIVE mg/dL   Protein, ur 30 (A) NEGATIVE mg/dL   Nitrite NEGATIVE NEGATIVE   Leukocytes,Ua NEGATIVE NEGATIVE  RBC / HPF 0-5 0 - 5 RBC/hpf   WBC, UA 0-5 0 - 5 WBC/hpf   Bacteria, UA NONE SEEN NONE SEEN   Squamous Epithelial / LPF NONE SEEN 0 - 5   Mucus PRESENT    ____________________________________________  EKG My review and personal interpretation at Time: 8:25   Indication: chest pain  Rate: 65   Rhythm: sinus Axis: normal Other: left, nonspecific st and t wave abn consistent with previous ____________________________________________  RADIOLOGY  I personally reviewed all radiographic images ordered to evaluate for the above acute complaints and reviewed radiology reports and findings.  These findings were personally discussed with the patient.  Please see medical record for radiology report.  ____________________________________________   PROCEDURES  Procedure(s) performed:  .Critical Care Performed by: Willy Eddy, MD Authorized by: Willy Eddy, MD   Critical care provider statement:    Critical care time (minutes):  35   Critical care time was exclusive of:  Separately billable procedures and treating other patients   Critical care was necessary to treat or prevent imminent or life-threatening deterioration of the following conditions:  Cardiac failure   Critical care was time spent personally by me on the following activities:  Development of treatment plan with patient or surrogate, discussions with consultants, evaluation of patient's response to treatment, examination of patient, obtaining history from patient or surrogate, ordering and performing treatments and interventions, ordering and review of laboratory studies, ordering and review of radiographic studies, pulse oximetry, re-evaluation of patient's condition and review of old charts      Critical Care performed: yes ____________________________________________   INITIAL IMPRESSION / ASSESSMENT AND PLAN / ED COURSE  Pertinent labs & imaging results that were available during my care of the patient were reviewed by me and considered in my medical decision making (see chart for details).   DDX: acs, medication noncompliance, orthostasis, cva, anemia, aki, electrolyte abn  Pinkney Venard is a 69 y.o. who presents to the ED with symptoms and presentation as described above.  Patient nontoxic but with  extensive past medical history and complex cardiac history as well currently not compliant with his medications due to running out of his medications roughly 1 week ago.  Not orthostatic.  Does not have any focal neuro deficits.  Have a lower suspicion for TIA versus CNS lesion.  Was however complaining of some mild headache.  EKG is consistent with previous but patient is having some intermittent chest discomfort over the past week or so with a troponin significantly elevated to 700 this may be secondary to poor blood pressure control and off of his meds but will treat due to concern for NSTEMI.  Will heparinize after obtaining CT imaging to ensure there is no evidence of acute intracranial hemorrhage.  Cardiology consulted.  Will discuss with hospitalist for admission.     The patient was evaluated in Emergency Department today for the symptoms described in the history of present illness. He/she was evaluated in the context of the global COVID-19 pandemic, which necessitated consideration that the patient might be at risk for infection with the SARS-CoV-2 virus that causes COVID-19. Institutional protocols and algorithms that pertain to the evaluation of patients at risk for COVID-19 are in a state of rapid change based on information released by regulatory bodies including the CDC and federal and state organizations. These policies and algorithms were followed during the patient's care in the ED.  As part of my medical decision making, I reviewed the following data within the  electronic MEDICAL RECORD NUMBER Nursing notes reviewed and incorporated, Labs reviewed, notes from prior ED visits and [place] Controlled Substance Database   ____________________________________________   FINAL CLINICAL IMPRESSION(S) / ED DIAGNOSES  Final diagnoses:  Chest pain, unspecified type  Lightheadedness      NEW MEDICATIONS STARTED DURING THIS VISIT:  New Prescriptions   No medications on file     Note:  This  document was prepared using Dragon voice recognition software and may include unintentional dictation errors.    Merlyn Lot, MD 08/13/19 1114

## 2019-08-13 NOTE — ED Notes (Signed)
X-ray at bedside

## 2019-08-13 NOTE — ED Notes (Signed)
Pt taken to CT.

## 2019-08-13 NOTE — ED Triage Notes (Signed)
Patient to ER for c/o dizziness x few days. Also c/o lower abd pain x2 days. Patient reports dizziness is worse when standing.

## 2019-08-14 DIAGNOSIS — E119 Type 2 diabetes mellitus without complications: Secondary | ICD-10-CM | POA: Diagnosis not present

## 2019-08-14 DIAGNOSIS — R0789 Other chest pain: Secondary | ICD-10-CM

## 2019-08-14 DIAGNOSIS — I5022 Chronic systolic (congestive) heart failure: Secondary | ICD-10-CM | POA: Diagnosis not present

## 2019-08-14 LAB — BASIC METABOLIC PANEL
Anion gap: 8 (ref 5–15)
BUN: 15 mg/dL (ref 8–23)
CO2: 22 mmol/L (ref 22–32)
Calcium: 8.8 mg/dL — ABNORMAL LOW (ref 8.9–10.3)
Chloride: 107 mmol/L (ref 98–111)
Creatinine, Ser: 0.84 mg/dL (ref 0.61–1.24)
GFR calc Af Amer: >60 mL/min (ref >60)
GFR calc non Af Amer: >60 mL/min (ref >60)
Glucose, Bld: 99 mg/dL (ref 70–99)
Potassium: 4 mmol/L (ref 3.5–5.1)
Sodium: 137 mmol/L (ref 135–145)

## 2019-08-14 LAB — CBC
HCT: 50.2 % (ref 39.0–52.0)
Hemoglobin: 15.3 g/dL (ref 13.0–17.0)
MCH: 19.4 pg — ABNORMAL LOW (ref 26.0–34.0)
MCHC: 30.5 g/dL (ref 30.0–36.0)
MCV: 63.8 fL — ABNORMAL LOW (ref 80.0–100.0)
Platelets: 801 10*3/uL — ABNORMAL HIGH (ref 150–400)
RBC: 7.87 MIL/uL — ABNORMAL HIGH (ref 4.22–5.81)
RDW: 23.4 % — ABNORMAL HIGH (ref 11.5–15.5)
WBC: 12.5 10*3/uL — ABNORMAL HIGH (ref 4.0–10.5)
nRBC: 0 % (ref 0.0–0.2)

## 2019-08-14 LAB — HEMOGLOBIN A1C
Hgb A1c MFr Bld: 6 % — ABNORMAL HIGH (ref 4.8–5.6)
Mean Plasma Glucose: 125.5 mg/dL

## 2019-08-14 LAB — GLUCOSE, CAPILLARY
Glucose-Capillary: 105 mg/dL — ABNORMAL HIGH (ref 70–99)
Glucose-Capillary: 150 mg/dL — ABNORMAL HIGH (ref 70–99)

## 2019-08-14 LAB — ECHOCARDIOGRAM COMPLETE
Height: 67 in
Weight: 2560 oz

## 2019-08-14 LAB — HEPARIN LEVEL (UNFRACTIONATED)
Heparin Unfractionated: 0.4 IU/mL (ref 0.30–0.70)
Heparin Unfractionated: 0.35 IU/mL (ref 0.30–0.70)

## 2019-08-14 LAB — LIPID PANEL
Cholesterol: 155 mg/dL (ref 0–200)
HDL: 35 mg/dL — ABNORMAL LOW (ref >40)
LDL Cholesterol: 104 mg/dL — ABNORMAL HIGH (ref 0–99)
Total CHOL/HDL Ratio: 4.4 RATIO
Triglycerides: 78 mg/dL (ref <150)
VLDL: 16 mg/dL (ref 0–40)

## 2019-08-14 MED ORDER — FUROSEMIDE 20 MG PO TABS: 20.0000 mg | ORAL_TABLET | Freq: Every day | ORAL | 0 refills | Status: DC

## 2019-08-14 MED ORDER — ATORVASTATIN CALCIUM 40 MG PO TABS: 40.0000 mg | ORAL_TABLET | Freq: Every day | ORAL | 0 refills | Status: DC

## 2019-08-14 MED ORDER — APIXABAN 5 MG PO TABS
5.0000 mg | ORAL_TABLET | Freq: Two times a day (BID) | ORAL | Status: DC
  Administered 2019-08-14: 5 mg via ORAL
  Filled 2019-08-14: qty 1

## 2019-08-14 MED ORDER — ASPIRIN 81 MG PO TBEC: 81.0000 mg | DELAYED_RELEASE_TABLET | Freq: Every day | ORAL | 0 refills | Status: DC

## 2019-08-14 MED ORDER — FUROSEMIDE 20 MG PO TABS
20.0000 mg | ORAL_TABLET | Freq: Every day | ORAL | Status: DC
  Administered 2019-08-14: 20 mg via ORAL
  Filled 2019-08-14: qty 1

## 2019-08-14 MED ORDER — NITROGLYCERIN 0.4 MG SL SUBL: 0.4000 mg | SUBLINGUAL_TABLET | SUBLINGUAL | 12 refills | Status: AC | PRN

## 2019-08-14 NOTE — Consult Note (Signed)
ANTICOAGULATION CONSULT NOTE   Pharmacy Consult for Heparin Indication: chest pain/ACS/STEMI  Allergies  Allergen Reactions  . Penicillins Other (See Comments)    Has patient had a PCN reaction causing immediate rash, facial/tongue/throat swelling, SOB or lightheadedness with hypotension: Unknown Has patient had a PCN reaction causing severe rash involving mucus membranes or skin necrosis: Unknown Has patient had a PCN reaction that required hospitalization: Unknown Has patient had a PCN reaction occurring within the last 10 years: No If all of the above answers are "NO", then may proceed with Cephalosporin use.     Patient Measurements: Height: 5\' 7"  (170.2 cm) Weight: 72.6 kg (160 lb) IBW/kg (Calculated) : 66.1 Heparin Dosing Weight: 72.6kg  Vital Signs: Temp: 98.4 F (36.9 C) (06/08 1948) Temp Source: Oral (06/08 1948) BP: 123/79 (06/08 1948) Pulse Rate: 73 (06/08 1948)  Labs: Recent Labs    08/13/19 10/13/19 08/13/19 0836 08/13/19 0836 08/13/19 1033 08/13/19 1330 08/13/19 1617 08/14/19 0122  HGB  --  15.8  --   --   --   --  15.3  HCT  --  52.9*  --   --   --   --  50.2  PLT  --  771*  --   --   --   --  801*  APTT 40*  --   --   --   --   --   --   LABPROT 14.6  --   --   --   --   --   --   INR 1.2  --   --   --   --   --   --   HEPARINUNFRC <0.10*  --   --   --   --  0.19* 0.40  CREATININE  --  0.90  --   --   --   --  0.84  TROPONINIHS  --  730*   < > 737* 740* 802*  --    < > = values in this interval not displayed.    Estimated Creatinine Clearance: 79.8 mL/min (by C-G formula based on SCr of 0.84 mg/dL).   Medical History: Past Medical History:  Diagnosis Date  . CAD (coronary artery disease)   . Chronic systolic (congestive) heart failure (HCC)   . Diabetes mellitus without complication (HCC)   . GERD (gastroesophageal reflux disease)   . Gout   . Hx of aortic valve replacement    Bioprosthetic valve  . Hypertension     Medications:   Medications Prior to Admission  Medication Sig Dispense Refill Last Dose  . lisinopril (ZESTRIL) 10 MG tablet Take 1 tablet (10 mg total) by mouth daily. 30 tablet 0   . allopurinol (ZYLOPRIM) 300 MG tablet Take 300 mg by mouth daily.     10/14/19 apixaban (ELIQUIS) 5 MG TABS tablet Take 1 tablet (5 mg total) by mouth 2 (two) times daily. 60 tablet 0   . carvedilol (COREG) 12.5 MG tablet Take 12.5 mg by mouth 2 (two) times a day.     . colchicine 0.6 MG tablet Take 2 tabs PO x 1, then 1 tab PO 1 hour later x 1 Max: 1.8 mg total dose per attack, do not repeat within 3 days. 10 tablet 0    Scheduled:  . allopurinol  300 mg Oral Daily  . aspirin EC  81 mg Oral Daily  . atorvastatin  40 mg Oral Daily  . carvedilol  12.5 mg Oral BID WC  .  insulin aspart  0-9 Units Subcutaneous Q4H  . lisinopril  10 mg Oral Daily  . nicotine  21 mg Transdermal Daily  . nitroGLYCERIN  1 inch Topical Q6H   Infusions:  . heparin 1,050 Units/hr (08/13/19 1848)   PRN: acetaminophen, hydrALAZINE, morphine injection, nitroGLYCERIN Anti-infectives (From admission, onward)   None      Assessment: Patient is a 69yo male presenting to the ED with dizziness and abdominal pain. Patient reports taking last Eliquis dose two days ago.  Pharmacy has been consulted for heparin initiation.     6/8 1617 HL 0.19 6/9 0122 HL 0.40, therapeutic x 1, CBC stable  Goal of Therapy:  Heparin level 0.3-0.7 units/ml Monitor platelets by anticoagulation protocol: Yes   Plan:  Heparin is now therapeutic.  CBC stable.  Will continue heparin infusion rate at 1050 units/hr.  Recheck Heparin level in 6 hours to confirm.  CBC daily with AM labs per protocol.    Ena Dawley, PharmD 08/14/2019,3:02 AM

## 2019-08-14 NOTE — Discharge Summary (Signed)
Discharge Summary  Travis Maxwell EHM:[id number] DOB: Jan 30, 1951  PCP: [facility] [place] date: 08/13/2019 Discharge date: 08/14/2019  Time spent:  37mins  Recommendations for Outpatient Follow-up:  1. F/u with PCP within a week  for hospital discharge follow up, repeat cbc/bmp at follow up 2. Follow-up with cardiology 3. Home health RN for disease management for heart failure ordered at discharge  Discharge Diagnoses:  Active Hospital Problems   Diagnosis Date Noted  . Chest pain 08/13/2019  . Diabetes mellitus without complication ([place])   . Gout   . Hypertension   . Dizziness   . Chronic systolic CHF (congestive heart failure) ([place])   . Tobacco abuse   . CAD (coronary artery disease)   . NSTEMI (non-ST elevated myocardial infarction) ([place]) 07/24/2017    Resolved Hospital Problems  No resolved problems to display.    Discharge Condition: stable  Diet recommendation: heart healthy/carb modified  Filed Weights   08/13/19 0816 08/14/19 0505  Weight: 72.6 kg 69.1 kg    History of present illness: (per admitting Md Dr Blaine Hamper) PCP: [facility]   Patient coming from:  The patient is coming from home.  At baseline, pt is independent for most of ADL.        Chief Complaint: chest, dizziness  HPI: Travis Maxwell is a 69 y.o. male with medical history significant of hypertension, diabetes mellitus, GERD, gout, tobacco abuse, CAD, stent placement, aortic valve replacement with bioprosthetic valve, sCHF with EF 35-40%, who presents with chest pain and dizziness.  Patient states that his chest has been going on for almost 2 days.  It is located in the substernal area, intermittent, dull, moderate, nonradiating.  Patient has mild cough, no shortness of breath, fever or chills.  Patient reported to ED physician that he had mild lower abdominal pain earlier, but denies abdominal pain to me.  No nausea, vomiting or diarrhea.  No symptoms of UTI.   Patient states that he has lightheadedness and dizziness in the past several days.  No unilateral numbness or tingling in extremities, no facial droop or slurred speech.  ED Course: pt was found to have troponin 730 -->737, WBC 11.0, pending Covid PCR, electrolytes renal function okay, temperature normal, blood pressure 129/70, heart rate of 49-72, RR 27, oxygen saturation 100% on room air.  Chest x-ray is negative.  Pending CT head.  Patient is admitted to progressive plan as inpatient  Hospital Course:  Principal Problem:   Chest pain Active Problems:   NSTEMI (non-ST elevated myocardial infarction) ([place])   Diabetes mellitus without complication (HCC)   Gout   Hypertension   Dizziness   Chronic systolic CHF (congestive heart failure) (HCC)   Tobacco abuse   CAD (coronary artery disease)  Chest pain, is flat troponin elevation consistent with demand ischemia, no current evidence of acute coronary syndrome per cardiology coronary artery disease status post previous PCI and stent placement  History of chronic systolic CHF Medication noncompliance  his chest pain has resolved, he is cleared to discharge home by cardiology, patient is advised to reinstatement of medication management  He does not previously take aspirin, aspirin prescribed at discharge He is started on the low-dose diuretics Lasix 20 mg daily per cardiology recommendation. Continue home medication lisinopril and Coreg He ambulated prior to discharge without complaining of chest pain or dizziness.  H/o aortic valve replacement , Bioprosthetic valve per chart review.  Hypertension continue lisinopril and Coreg.  Diet controled diabetes mellitus without complication (  HCC): A1c 5.6 on 07/25/17, well controled.  Blood sugar 118  Other home medication, he is on Eliquis for unclear reason, he is to follow-up with PCP and cardiology.  Medication noncompliance I tried to reach patient's daughter with his permission not able  to reach them. I talked to the patient friend who he lives with with permission regarding patient's need to take his medication as prescribed.   Home health RN ordered for medication management for heart failure  Overall poor prognosis   Procedures:  None  Consultations:  Cardiology  Discharge Exam: BP 111/90 (BP Location: Right Arm)   Pulse 68   Temp 98.2 F (36.8 C) (Oral)   Resp 18   Ht 5\' 7"  (1.702 m)   Wt 69.1 kg   SpO2 100%   BMI 23.87 kg/m   General: NAD, ambulating Cardiovascular: RRR Respiratory: Clear to auscultation bilaterally  Discharge Instructions You were cared for by a hospitalist during your hospital stay. If you have any questions about your discharge medications or the care you received while you were in the hospital after you are discharged, you can call the unit and asked to speak with the hospitalist on call if the hospitalist that took care of you is not available. Once you are discharged, your primary care physician will handle any further medical issues. Please note that NO REFILLS for any discharge medications will be authorized once you are discharged, as it is imperative that you return to your primary care physician (or establish a relationship with a primary care physician if you do not have one) for your aftercare needs so that they can reassess your need for medications and monitor your lab values.  Discharge Instructions    Diet - low sodium heart healthy   Complete by: As directed    Carb modified diet   Increase activity slowly   Complete by: As directed      Allergies as of 08/14/2019      Reactions   Penicillins Other (See Comments)   Has patient had a PCN reaction causing immediate rash, facial/tongue/throat swelling, SOB or lightheadedness with hypotension: Unknown Has patient had a PCN reaction causing severe rash involving mucus membranes or skin necrosis: Unknown Has patient had a PCN reaction that required hospitalization:  Unknown Has patient had a PCN reaction occurring within the last 10 years: No If all of the above answers are "NO", then may proceed with Cephalosporin use.      Medication List    TAKE these medications   allopurinol 300 MG tablet Commonly known as: ZYLOPRIM Take 300 mg by mouth daily.   apixaban 5 MG Tabs tablet Commonly known as: ELIQUIS Take 1 tablet (5 mg total) by mouth 2 (two) times daily.   aspirin 81 MG EC tablet Take 1 tablet (81 mg total) by mouth daily. Start taking on: August 15, 2019   atorvastatin 40 MG tablet Commonly known as: LIPITOR Take 1 tablet (40 mg total) by mouth daily. Start taking on: August 15, 2019   carvedilol 12.5 MG tablet Commonly known as: COREG Take 12.5 mg by mouth 2 (two) times a day.   colchicine 0.6 MG tablet Take 2 tabs PO x 1, then 1 tab PO 1 hour later x 1 Max: 1.8 mg total dose per attack, do not repeat within 3 days.   furosemide 20 MG tablet Commonly known as: LASIX Take 1 tablet (20 mg total) by mouth daily. Start taking on: August 15, 2019   lisinopril  10 MG tablet Commonly known as: ZESTRIL Take 1 tablet (10 mg total) by mouth daily.   nitroGLYCERIN 0.4 MG SL tablet Commonly known as: NITROSTAT Place 1 tablet (0.4 mg total) under the tongue every 5 (five) minutes as needed for chest pain.      Allergies  Allergen Reactions  . Penicillins Other (See Comments)    Has patient had a PCN reaction causing immediate rash, facial/tongue/throat swelling, SOB or lightheadedness with hypotension: Unknown Has patient had a PCN reaction causing severe rash involving mucus membranes or skin necrosis: Unknown Has patient had a PCN reaction that required hospitalization: Unknown Has patient had a PCN reaction occurring within the last 10 years: No If all of the above answers are "NO", then may proceed with Cephalosporin use.    Follow-up Information    [facility] Follow up in 1 week(s).   Specialty: General  Practice Why: Hospital discharge follow-up Contact information: [address] [place] [address] [phone]        Lamar Blinks, MD Follow up in 2 week(s).   Specialty: Cardiology Why: for chest pain, heart failure Contact information: [address] [facility] [place] [address] [phone]            The results of significant diagnostics from this hospitalization (including imaging, microbiology, ancillary and laboratory) are listed below for reference.    Significant Diagnostic Studies: CT Head Wo Contrast  Result Date: 08/13/2019 CLINICAL DATA:  Headache, dizziness EXAM: CT HEAD WITHOUT CONTRAST TECHNIQUE: Contiguous axial images were obtained from the base of the skull through the vertex without intravenous contrast. COMPARISON:  07/24/2017 FINDINGS: Brain: No evidence of acute infarction, hemorrhage, hydrocephalus, extra-axial collection or mass lesion/mass effect. Vascular: Atherosclerotic calcifications involving the large vessels of the skull base. No unexpected hyperdense vessel. Skull: Normal. Negative for fracture or focal lesion. Sinuses/Orbits: No acute finding. Other: None. IMPRESSION: No acute intracranial findings. Electronically Signed   By: Duanne Guess D.O.   On: 08/13/2019 11:18   DG Chest Portable 1 View  Result Date: 08/13/2019 CLINICAL DATA:  Weakness EXAM: PORTABLE CHEST 1 VIEW COMPARISON:  08/14/2017 FINDINGS: Stable cardiomegaly. Interval median sternotomy with prosthetic cardiac valve. No focal airspace consolidation, pleural effusion, or pneumothorax. 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 No new or acute osseous findings are evident. IMPRESSION: No active disease. Stable cardiomegaly. Electronically Signed   By: Duanne Guess D.O.   On: 08/13/2019 09:46   ECHOCARDIOGRAM COMPLETE  Result Date: 08/14/2019    ECHOCARDIOGRAM REPORT   Patient Name:    Travis Maxwell Date of Exam: 08/13/2019 Medical Rec #:  [id number]      Height:       67.0 in Accession #:    [id number]     Weight:       160.0 lb Date of Birth:  12-Jul-1950      BSA:          1.839 m Patient Age:    24 years       BP:           123/79 mmHg Patient Gender: M              HR:           55 bpm. Exam Location:  [facility] Procedure: 2D Echo, Cardiac Doppler, Color Doppler and Intracardiac            Opacification Agent Indications:  R07.9 Chest pain  History:         Patient has prior history of Echocardiogram examinations, most                  recent 07/25/2017. Aortic Valve Replacement; Risk                  Factors:Hypertension and Diabetes. Chronic systolic heart                  failure. Coronary artery disease.  Sonographer:     [place] Small Rodgers-Jones Referring Phys:  [id number] NIU Diagnosing Phys: Arnoldo Hooker MD IMPRESSIONS  1. Left ventricular ejection fraction, by estimation, is <20%. The left ventricle has severely decreased function. The left ventricle demonstrates global hypokinesis. The left ventricular internal cavity size was severely dilated. There is mild left ventricular hypertrophy. Left ventricular diastolic parameters were normal.  2. Right ventricular systolic function is normal. The right ventricular size is normal.  3. Left atrial size was moderately dilated.  4. Right atrial size was mildly dilated.  5. The mitral valve is normal in structure. Moderate to severe mitral valve regurgitation.  6. Tricuspid valve regurgitation is mild to moderate.  7. The aortic valve is normal in structure. Aortic valve regurgitation is mild. FINDINGS  Left Ventricle: Left ventricular ejection fraction, by estimation, is <20%. The left ventricle has severely decreased function. The left ventricle demonstrates global hypokinesis. Definity contrast agent was given IV to delineate the left ventricular endocardial borders. The left ventricular internal cavity size was severely dilated. There is mild  left ventricular hypertrophy. Left ventricular diastolic parameters were normal. Right Ventricle: The right ventricular size is normal. No increase in right ventricular wall thickness. Right ventricular systolic function is normal. Left Atrium: Left atrial size was moderately dilated. Right Atrium: Right atrial size was mildly dilated. Pericardium: There is no evidence of pericardial effusion. Mitral Valve: The mitral valve is normal in structure. Moderate to severe mitral valve regurgitation. Tricuspid Valve: The tricuspid valve is normal in structure. Tricuspid valve regurgitation is mild to moderate. Aortic Valve: The aortic valve is normal in structure. Aortic valve regurgitation is mild. Aortic valve mean gradient measures 10.8 mmHg. Aortic valve peak gradient measures 18.0 mmHg. Pulmonic Valve: The pulmonic valve was normal in structure. Pulmonic valve regurgitation is trivial. Aorta: The aortic root and ascending aorta are structurally normal, with no evidence of dilitation. IAS/Shunts: No atrial level shunt detected by color flow Doppler.  LEFT VENTRICLE PLAX 2D LVIDd:         6.82 cm Diastology LVIDs:         6.13 cm LV e' lateral:   5.22 cm/s LV PW:         1.09 cm LV E/e' lateral: 20.9 LV IVS:        1.09 cm LV e' medial:    4.13 cm/s                        LV E/e' medial:  26.4  RIGHT VENTRICLE            IVC RV Basal diam:  4.48 cm    IVC diam: 1.62 cm RV S prime:     8.59 cm/s TAPSE (M-mode): 2.4 cm LEFT ATRIUM           Index       RIGHT ATRIUM           Index LA diam:  5.90 cm 3.21 cm/m  RA Area:     15.50 cm LA Vol (A2C): 97.9 ml 53.23 ml/m RA Volume:   43.20 ml  23.49 ml/m LA Vol (A4C): 81.2 ml 44.15 ml/m  AORTIC VALVE AV Vmax:           212.00 cm/s AV Vmean:          157.800 cm/s AV VTI:            0.405 m AV Peak Grad:      18.0 mmHg AV Mean Grad:      10.8 mmHg LVOT Vmax:         62.50 cm/s LVOT Vmean:        47.200 cm/s LVOT VTI:          0.129 m LVOT/AV VTI ratio: 0.32  AORTA Ao Root  diam: 3.10 cm MITRAL VALVE MV Area (PHT): 4.60 cm     SHUNTS MV Decel Time: 165 msec     Systemic VTI: 0.13 m MV E velocity: 109.00 cm/s MV A velocity: 89.70 cm/s MV E/A ratio:  1.22 Arnoldo Hooker MD Electronically signed by Arnoldo Hooker MD Signature Date/Time: 08/14/2019/8:50:30 AM    Final     Microbiology: Recent Results (from the past 240 hour(s))  SARS Coronavirus 2 by RT PCR (hospital order, performed in [facility] hospital lab) Nasopharyngeal Nasopharyngeal Swab     Status: None   Collection Time: 08/13/19 10:33 AM   Specimen: Nasopharyngeal Swab  Result Value Ref Range Status   SARS Coronavirus 2 NEGATIVE NEGATIVE Final    Comment: (NOTE) SARS-CoV-2 target nucleic acids are NOT DETECTED. The SARS-CoV-2 RNA is generally detectable in upper and lower respiratory specimens during the acute phase of infection. The lowest concentration of SARS-CoV-2 viral copies this assay can detect is 250 copies / mL. A negative result does not preclude SARS-CoV-2 infection and should not be used as the sole basis for treatment or other patient management decisions.  A negative result may occur with improper specimen collection / handling, submission of specimen other than nasopharyngeal swab, presence of viral mutation(s) within the areas targeted by this assay, and inadequate number of viral copies (<250 copies / mL). A negative result must be combined with clinical observations, patient history, and epidemiological information. Fact Sheet for Patients:   BoilerBrush.com.cy Fact Sheet for Healthcare Providers: https://pope.com/ This test is not yet approved or cleared  by the [place] FDA and has been authorized for detection and/or diagnosis of SARS-CoV-2 by FDA under an Emergency Use Authorization (EUA).  This EUA will remain in effect (meaning this test can be used) for the duration of the COVID-19 declaration under Section 564(b)(1) of  the Act, 21 U.S.C. section 360bbb-3(b)(1), unless the authorization is terminated or revoked sooner. Performed at [facility], [address] [place], [place] [address]      Labs: Basic Metabolic Panel: Recent Labs  Lab 08/13/19 0836 08/14/19 0122  NA 140 137  K 4.1 4.0  CL 107 107  CO2 25 22  GLUCOSE 118* 99  BUN 20 15  CREATININE 0.90 0.84  CALCIUM 9.0 8.8*   Liver Function Tests: Recent Labs  Lab 08/13/19 0836  AST 21  ALT 19  ALKPHOS 68  BILITOT 0.6  PROT 7.3  ALBUMIN 3.9   Recent Labs  Lab 08/13/19 0836  LIPASE 27   No results for input(s): AMMONIA in the last 168 hours. CBC: Recent Labs  Lab 08/13/19 0836 08/14/19 0122  WBC  11.0* 12.5*  HGB 15.8 15.3  HCT 52.9* 50.2  MCV 64.5* 63.8*  PLT 771* 801*   Cardiac Enzymes: No results for input(s): CKTOTAL, CKMB, CKMBINDEX, TROPONINI in the last 168 hours. BNP: BNP (last 3 results) Recent Labs    08/13/19 0836  BNP 379.6*    ProBNP (last 3 results) No results for input(s): PROBNP in the last 8760 hours.  CBG: Recent Labs  Lab 08/13/19 1557 08/13/19 2014 08/14/19 0747 08/14/19 1127  GLUCAP 85 95 105* 150*       Signed:  Albertine Grates MD, PhD, FACP  [facility] 08/14/2019, 2:38 PM

## 2019-08-14 NOTE — Progress Notes (Signed)
PT Cancellation Note  Patient Details Name: Travis Maxwell MRN: [id number] DOB: [birth year]   Cancelled Treatment:    Reason Eval/Treat Not Completed: PT screened, no needs identified, will sign off(Consult received and chart reviewed. Per primary RN, patient ambulatory in room and around unit without acute PT needs identified.  Will complete order at this time; please re-consult as medically appropriate should needs change.)  Adoni Greenough H. Manson Passey, PT, DPT, NCS 08/14/19, 1:28 PM [phone]

## 2019-08-14 NOTE — Consult Note (Signed)
ANTICOAGULATION CONSULT NOTE   Pharmacy Consult for Heparin Indication: chest pain/ACS/STEMI  Allergies  Allergen Reactions  . Penicillins Other (See Comments)    Has patient had a PCN reaction causing immediate rash, facial/tongue/throat swelling, SOB or lightheadedness with hypotension: Unknown Has patient had a PCN reaction causing severe rash involving mucus membranes or skin necrosis: Unknown Has patient had a PCN reaction that required hospitalization: Unknown Has patient had a PCN reaction occurring within the last 10 years: No If all of the above answers are "NO", then may proceed with Cephalosporin use.     Patient Measurements: Height: 5\' 7"  (170.2 cm) Weight: 69.1 kg (152 lb 6.4 oz) IBW/kg (Calculated) : 66.1 Heparin Dosing Weight: 72.6kg  Vital Signs: Temp: 98.2 F (36.8 C) (06/09 0748) Temp Source: Oral (06/09 0748) BP: 109/58 (06/09 0748) Pulse Rate: 62 (06/09 0748)  Labs: Recent Labs    08/13/19 10/13/19 08/13/19 10/13/19 08/13/19 0836 08/13/19 0836 08/13/19 1033 08/13/19 1330 08/13/19 1617 08/14/19 0122 08/14/19 0824  HGB  --   --  15.8  --   --   --   --  15.3  --   HCT  --   --  52.9*  --   --   --   --  50.2  --   PLT  --   --  771*  --   --   --   --  801*  --   APTT 40*  --   --   --   --   --   --   --   --   LABPROT 14.6  --   --   --   --   --   --   --   --   INR 1.2  --   --   --   --   --   --   --   --   HEPARINUNFRC <0.10*   < >  --   --   --   --  0.19* 0.40 0.35  CREATININE  --   --  0.90  --   --   --   --  0.84  --   TROPONINIHS  --   --  730*   < > 737* 740* 802*  --   --    < > = values in this interval not displayed.    Estimated Creatinine Clearance: 79.8 mL/min (by C-G formula based on SCr of 0.84 mg/dL).   Medical History: Past Medical History:  Diagnosis Date  . CAD (coronary artery disease)   . Chronic systolic (congestive) heart failure (HCC)   . Diabetes mellitus without complication (HCC)   . GERD (gastroesophageal  reflux disease)   . Gout   . Hx of aortic valve replacement    Bioprosthetic valve  . Hypertension     Medications:  Medications Prior to Admission  Medication Sig Dispense Refill Last Dose  . lisinopril (ZESTRIL) 10 MG tablet Take 1 tablet (10 mg total) by mouth daily. 30 tablet 0   . allopurinol (ZYLOPRIM) 300 MG tablet Take 300 mg by mouth daily.     10/14/19 apixaban (ELIQUIS) 5 MG TABS tablet Take 1 tablet (5 mg total) by mouth 2 (two) times daily. 60 tablet 0   . carvedilol (COREG) 12.5 MG tablet Take 12.5 mg by mouth 2 (two) times a day.     . colchicine 0.6 MG tablet Take 2 tabs PO x 1, then 1 tab PO 1 hour  later x 1 Max: 1.8 mg total dose per attack, do not repeat within 3 days. 10 tablet 0    Scheduled:  . allopurinol  300 mg Oral Daily  . aspirin EC  81 mg Oral Daily  . atorvastatin  40 mg Oral Daily  . carvedilol  12.5 mg Oral BID WC  . insulin aspart  0-9 Units Subcutaneous Q4H  . lisinopril  10 mg Oral Daily  . nicotine  21 mg Transdermal Daily  . nitroGLYCERIN  1 inch Topical Q6H   Infusions:  . heparin 1,050 Units/hr (08/13/19 1848)   PRN: acetaminophen, hydrALAZINE, morphine injection, nitroGLYCERIN Anti-infectives (From admission, onward)   None      Assessment: Patient is a 69yo male presenting to the ED with dizziness and abdominal pain. Patient reports taking last Eliquis dose two days ago.  Pharmacy has been consulted for heparin initiation.     6/8 1617 HL 0.19 6/9 0122 HL 0.40, therapeutic x 1, CBC stable 6/9 0824 HL 0.35  Goal of Therapy:  Heparin level 0.3-0.7 units/ml Monitor platelets by anticoagulation protocol: Yes   Plan:  Heparin level is therapeutic. CBC stable.  Will continue heparin infusion rate at 1050 units/hr. Recheck heparin and CBC with AM labs.    Oswald Hillock, PharmD, BCPS 08/14/2019,8:54 AM

## 2019-08-14 NOTE — Progress Notes (Signed)
OT Cancellation Note  Patient Details Name: Travis Maxwell MRN: [id number] DOB: April 01, 1950   Cancelled Treatment:    Reason Eval/Treat Not Completed: OT screened, no needs identified, will sign off   Consult received and chart reviewed. Per primary RN, patient ambulatory in room and completing functional tasks with no acute OT needs identified.  Will complete order at this time; please re-consult should pt have change in functional status or additional OT-related needs arise.  Kathyrn Drown Ziva Nunziata, OTR/L 08/14/19, 1:43 PM

## 2019-08-14 NOTE — Progress Notes (Signed)
[facility] Encounter Note  Patient: Travis Maxwell / Admit Date: 08/13/2019 / Date of Encounter: 08/14/2019, 9:09 AM   Subjective: Patient feels much better at this time with a reinstatement of his medications that he had previously had not been able to get or take.  Not sure of the primary noncompliance concerns.  The patient was reinstated with carvedilol lisinopril with excellent blood pressure control at this time and no further chest discomfort or shortness of breath.  No current evidence of congestive heart failure by exam.  Patient is ambulating well without evidence of symptoms.  Patient does have a severely abnormal EKG showing normal sinus rhythm with left anterior fascicular block and PVCs which is unchanged from EKG in the last 2 years.  Echocardiogram shows severe LV systolic dysfunction with ejection fraction of less than 20% with moderate to severe mitral regurgitation dilation of the left atrium and left ventricle not significantly changed from before.  Troponin is elevated but flat not consistent with acute coronary syndrome but more consistent with demand ischemia.  With no further anginal symptoms would consider ambulation and follow for improvements of symptoms and possible discharge home if ambulating well  Review of Systems: Positive for: None Negative for: Vision change, hearing change, syncope, dizziness, nausea, vomiting,diarrhea, bloody stool, stomach pain, cough, congestion, diaphoresis, urinary frequency, urinary pain,skin lesions, skin rashes Others previously listed  Objective: Telemetry: Normal sinus rhythm Physical Exam: Blood pressure (!) 109/58, pulse 62, temperature 98.2 F (36.8 C), temperature source Oral, resp. rate 17, height 5\' 7"  (1.702 m), weight 69.1 kg, SpO2 98 %. Body mass index is 23.87 kg/m. General: Well developed, well nourished, in no acute distress. Head: Normocephalic, atraumatic, sclera non-icteric, no xanthomas, nares are  without discharge. Neck: No apparent masses Lungs: Normal respirations with no wheezes, no rhonchi, no rales , no crackles   Heart: Regular rate and rhythm, normal S1 S2, 2-3+ apical murmur, no rub, no gallop, PMI is normal size and placement, carotid upstroke normal without bruit, jugular venous pressure normal Abdomen: Soft, non-tender, non-distended with normoactive bowel sounds. No hepatosplenomegaly. Abdominal aorta is normal size without bruit Extremities: No edema, no clubbing, no cyanosis, no ulcers,  Peripheral: 2+ radial, 2+ femoral, 2+ dorsal pedal pulses Neuro: Alert and oriented. Moves all extremities spontaneously. Psych:  Responds to questions appropriately with a normal affect.   Intake/Output Summary (Last 24 hours) at 08/14/2019 0909 Last data filed at 08/14/2019 0504 Gross per 24 hour  Intake 1408.99 ml  Output 775 ml  Net 633.99 ml    Inpatient Medications:  . allopurinol  300 mg Oral Daily  . aspirin EC  81 mg Oral Daily  . atorvastatin  40 mg Oral Daily  . carvedilol  12.5 mg Oral BID WC  . insulin aspart  0-9 Units Subcutaneous Q4H  . lisinopril  10 mg Oral Daily  . nicotine  21 mg Transdermal Daily  . nitroGLYCERIN  1 inch Topical Q6H   Infusions:  . heparin 1,050 Units/hr (08/13/19 1848)    Labs: Recent Labs    08/13/19 0836 08/14/19 0122  NA 140 137  K 4.1 4.0  CL 107 107  CO2 25 22  GLUCOSE 118* 99  BUN 20 15  CREATININE 0.90 0.84  CALCIUM 9.0 8.8*   Recent Labs    08/13/19 0836  AST 21  ALT 19  ALKPHOS 68  BILITOT 0.6  PROT 7.3  ALBUMIN 3.9   Recent Labs    08/13/19 0836 08/14/19 0122  WBC 11.0* 12.5*  HGB 15.8 15.3  HCT 52.9* 50.2  MCV 64.5* 63.8*  PLT 771* 801*   No results for input(s): CKTOTAL, CKMB, TROPONINI in the last 72 hours. Invalid input(s): POCBNP Recent Labs    08/14/19 0122  HGBA1C 6.0*     Weights: Filed Weights   08/13/19 0816 08/14/19 0505  Weight: 72.6 kg 69.1 kg     Radiology/Studies:  CT  Head Wo Contrast  Result Date: 08/13/2019 CLINICAL DATA:  Headache, dizziness EXAM: CT HEAD WITHOUT CONTRAST TECHNIQUE: Contiguous axial images were obtained from the base of the skull through the vertex without intravenous contrast. COMPARISON:  07/24/2017 FINDINGS: Brain: No evidence of acute infarction, hemorrhage, hydrocephalus, extra-axial collection or mass lesion/mass effect. Vascular: Atherosclerotic calcifications involving the large vessels of the skull base. No unexpected hyperdense vessel. Skull: Normal. Negative for fracture or focal lesion. Sinuses/Orbits: No acute finding. Other: None. IMPRESSION: No acute intracranial findings. Electronically Signed   By: Duanne Guess D.O.   On: 08/13/2019 11:18   DG Chest Portable 1 View  Result Date: 08/13/2019 CLINICAL DATA:  Weakness EXAM: PORTABLE CHEST 1 VIEW COMPARISON:  08/14/2017 FINDINGS: Stable cardiomegaly. Interval median sternotomy with prosthetic cardiac valve. No focal airspace consolidation, pleural effusion, or pneumothorax. 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 No new or acute osseous findings are evident. IMPRESSION: No active disease. Stable cardiomegaly. Electronically Signed   By: Duanne Guess D.O.   On: 08/13/2019 09:46   ECHOCARDIOGRAM COMPLETE  Result Date: 08/14/2019    ECHOCARDIOGRAM REPORT   Patient Name:   Travis Maxwell Date of Exam: 08/13/2019 Medical Rec #:  [id number]      Height:       67.0 in Accession #:    [id number]     Weight:       160.0 lb Date of Birth:  25-Dec-1950      BSA:          1.839 m Patient Age:    69 years       BP:           123/79 mmHg Patient Gender: M              HR:           55 bpm. Exam Location:  [facility] Procedure: 2D Echo, Cardiac Doppler, Color Doppler and Intracardiac            Opacification Agent Indications:     R07.9 Chest pain  History:         Patient has prior history of Echocardiogram examinations, most                  recent  07/25/2017. Aortic Valve Replacement; Risk                  Factors:Hypertension and Diabetes. Chronic systolic heart                  failure. Coronary artery disease.  Sonographer:     Sedonia Small Rodgers-Jones Referring Phys:  [id number] NIU Diagnosing Phys: Arnoldo Hooker MD IMPRESSIONS  1. Left ventricular ejection fraction, by estimation, is <20%. The left ventricle has severely decreased function. The left ventricle demonstrates global hypokinesis. The left ventricular internal cavity size was severely dilated. There is mild left ventricular hypertrophy. Left ventricular diastolic parameters were normal.  2. Right ventricular systolic function is normal. The right ventricular size is normal.  3. Left atrial size was moderately  dilated.  4. Right atrial size was mildly dilated.  5. The mitral valve is normal in structure. Moderate to severe mitral valve regurgitation.  6. Tricuspid valve regurgitation is mild to moderate.  7. The aortic valve is normal in structure. Aortic valve regurgitation is mild. FINDINGS  Left Ventricle: Left ventricular ejection fraction, by estimation, is <20%. The left ventricle has severely decreased function. The left ventricle demonstrates global hypokinesis. Definity contrast agent was given IV to delineate the left ventricular endocardial borders. The left ventricular internal cavity size was severely dilated. There is mild left ventricular hypertrophy. Left ventricular diastolic parameters were normal. Right Ventricle: The right ventricular size is normal. No increase in right ventricular wall thickness. Right ventricular systolic function is normal. Left Atrium: Left atrial size was moderately dilated. Right Atrium: Right atrial size was mildly dilated. Pericardium: There is no evidence of pericardial effusion. Mitral Valve: The mitral valve is normal in structure. Moderate to severe mitral valve regurgitation. Tricuspid Valve: The tricuspid valve is normal in structure. Tricuspid  valve regurgitation is mild to moderate. Aortic Valve: The aortic valve is normal in structure. Aortic valve regurgitation is mild. Aortic valve mean gradient measures 10.8 mmHg. Aortic valve peak gradient measures 18.0 mmHg. Pulmonic Valve: The pulmonic valve was normal in structure. Pulmonic valve regurgitation is trivial. Aorta: The aortic root and ascending aorta are structurally normal, with no evidence of dilitation. IAS/Shunts: No atrial level shunt detected by color flow Doppler.  LEFT VENTRICLE PLAX 2D LVIDd:         6.82 cm Diastology LVIDs:         6.13 cm LV e' lateral:   5.22 cm/s LV PW:         1.09 cm LV E/e' lateral: 20.9 LV IVS:        1.09 cm LV e' medial:    4.13 cm/s                        LV E/e' medial:  26.4  RIGHT VENTRICLE            IVC RV Basal diam:  4.48 cm    IVC diam: 1.62 cm RV S prime:     8.59 cm/s TAPSE (M-mode): 2.4 cm LEFT ATRIUM           Index       RIGHT ATRIUM           Index LA diam:      5.90 cm 3.21 cm/m  RA Area:     15.50 cm LA Vol (A2C): 97.9 ml 53.23 ml/m RA Volume:   43.20 ml  23.49 ml/m LA Vol (A4C): 81.2 ml 44.15 ml/m  AORTIC VALVE AV Vmax:           212.00 cm/s AV Vmean:          157.800 cm/s AV VTI:            0.405 m AV Peak Grad:      18.0 mmHg AV Mean Grad:      10.8 mmHg LVOT Vmax:         62.50 cm/s LVOT Vmean:        47.200 cm/s LVOT VTI:          0.129 m LVOT/AV VTI ratio: 0.32  AORTA Ao Root diam: 3.10 cm MITRAL VALVE MV Area (PHT): 4.60 cm     SHUNTS MV Decel Time: 165 msec     Systemic VTI: 0.13  m MV E velocity: 109.00 cm/s MV A velocity: 89.70 cm/s MV E/A ratio:  1.22 Serafina Royals MD Electronically signed by Serafina Royals MD Signature Date/Time: 08/14/2019/8:50:30 AM    Final      Assessment and Recommendation  69 y.o. male with known coronary artery disease status post previous PCI and stent placement chronic systolic dysfunction congestive heart failure with a stable ejection fraction of 25% hypertension hyperlipidemia diabetes aortic valve  replacement with minimal elevation of BNP at 379 and flat troponin elevation consistent with demand ischemia and no current evidence of acute coronary syndrome and no changes in EKG change now with reinstatement of appropriate medication management and feeling much better. 1.  We will continue reinstatement of medication management for chronic systolic dysfunction congestive heart failure including carvedilol and lisinopril 2.  Continue high intensity cholesterol therapy for coronary artery disease 3.  Continue aspirin for further risk reduction cardiovascular event 4.  Consider low-dose diuretic for congestive heart failure including Lasix 20 mg each day 5.  Begin ambulation and follow-up for improvements of symptoms and if ambulating well and feeling no further significant symptoms would discharge to home with follow-up with adjustments of medication management  Signed, Serafina Royals M.D. FACC

## 2019-08-14 NOTE — Progress Notes (Signed)
Patient ambulated more than 200'.  No complications noted.  No complaints of CP or SHoB.

## 2019-10-11 ENCOUNTER — Other Ambulatory Visit
Admission: RE | Admit: 2019-10-11 | Discharge: 2019-10-11 | Disposition: A | Discharge status: Home / Self Care | Payer: Medicare HMO | Source: Ambulatory Visit | Attending: Internal Medicine | Admitting: Internal Medicine

## 2019-10-11 DIAGNOSIS — R0602 Shortness of breath: Secondary | ICD-10-CM | POA: Diagnosis present

## 2019-10-11 DIAGNOSIS — Z03818 Encounter for observation for suspected exposure to other biological agents ruled out: Secondary | ICD-10-CM | POA: Diagnosis not present

## 2019-10-11 LAB — BRAIN NATRIURETIC PEPTIDE: B Natriuretic Peptide: 749.4 pg/mL — ABNORMAL HIGH (ref 0.0–100.0)

## 2019-11-30 ENCOUNTER — Other Ambulatory Visit: Payer: Self-pay

## 2019-11-30 ENCOUNTER — Emergency Department: Payer: Medicare HMO

## 2019-11-30 ENCOUNTER — Inpatient Hospital Stay
Admission: EM | Admit: 2019-11-30 | Discharge: 2019-12-02 | DRG: 291 | Disposition: A | Discharge status: Home / Self Care | Payer: Medicare HMO | Attending: Internal Medicine | Admitting: Internal Medicine

## 2019-11-30 DIAGNOSIS — I110 Hypertensive heart disease with heart failure: Principal | ICD-10-CM | POA: Diagnosis present

## 2019-11-30 DIAGNOSIS — Z79899 Other long term (current) drug therapy: Secondary | ICD-10-CM | POA: Diagnosis not present

## 2019-11-30 DIAGNOSIS — Z955 Presence of coronary angioplasty implant and graft: Secondary | ICD-10-CM

## 2019-11-30 DIAGNOSIS — R0602 Shortness of breath: Secondary | ICD-10-CM

## 2019-11-30 DIAGNOSIS — Z953 Presence of xenogenic heart valve: Secondary | ICD-10-CM

## 2019-11-30 DIAGNOSIS — M109 Gout, unspecified: Secondary | ICD-10-CM | POA: Diagnosis present

## 2019-11-30 DIAGNOSIS — J810 Acute pulmonary edema: Secondary | ICD-10-CM | POA: Diagnosis present

## 2019-11-30 DIAGNOSIS — D72829 Elevated white blood cell count, unspecified: Secondary | ICD-10-CM | POA: Diagnosis not present

## 2019-11-30 DIAGNOSIS — I480 Paroxysmal atrial fibrillation: Secondary | ICD-10-CM | POA: Diagnosis present

## 2019-11-30 DIAGNOSIS — I509 Heart failure, unspecified: Secondary | ICD-10-CM

## 2019-11-30 DIAGNOSIS — J9601 Acute respiratory failure with hypoxia: Secondary | ICD-10-CM | POA: Diagnosis present

## 2019-11-30 DIAGNOSIS — R778 Other specified abnormalities of plasma proteins: Secondary | ICD-10-CM | POA: Diagnosis not present

## 2019-11-30 DIAGNOSIS — Z720 Tobacco use: Secondary | ICD-10-CM | POA: Diagnosis present

## 2019-11-30 DIAGNOSIS — Z8249 Family history of ischemic heart disease and other diseases of the circulatory system: Secondary | ICD-10-CM | POA: Diagnosis not present

## 2019-11-30 DIAGNOSIS — F17210 Nicotine dependence, cigarettes, uncomplicated: Secondary | ICD-10-CM | POA: Diagnosis present

## 2019-11-30 DIAGNOSIS — I252 Old myocardial infarction: Secondary | ICD-10-CM

## 2019-11-30 DIAGNOSIS — I10 Essential (primary) hypertension: Secondary | ICD-10-CM | POA: Diagnosis present

## 2019-11-30 DIAGNOSIS — I5023 Acute on chronic systolic (congestive) heart failure: Secondary | ICD-10-CM | POA: Diagnosis present

## 2019-11-30 DIAGNOSIS — I248 Other forms of acute ischemic heart disease: Secondary | ICD-10-CM | POA: Diagnosis present

## 2019-11-30 DIAGNOSIS — I2510 Atherosclerotic heart disease of native coronary artery without angina pectoris: Secondary | ICD-10-CM | POA: Diagnosis present

## 2019-11-30 DIAGNOSIS — K219 Gastro-esophageal reflux disease without esophagitis: Secondary | ICD-10-CM | POA: Diagnosis present

## 2019-11-30 DIAGNOSIS — Z7982 Long term (current) use of aspirin: Secondary | ICD-10-CM | POA: Diagnosis not present

## 2019-11-30 DIAGNOSIS — Z7901 Long term (current) use of anticoagulants: Secondary | ICD-10-CM

## 2019-11-30 DIAGNOSIS — R7989 Other specified abnormal findings of blood chemistry: Secondary | ICD-10-CM | POA: Diagnosis present

## 2019-11-30 DIAGNOSIS — J1282 Pneumonia due to coronavirus disease 2019: Secondary | ICD-10-CM | POA: Diagnosis present

## 2019-11-30 DIAGNOSIS — Z9114 Patient's other noncompliance with medication regimen: Secondary | ICD-10-CM | POA: Diagnosis not present

## 2019-11-30 DIAGNOSIS — E119 Type 2 diabetes mellitus without complications: Secondary | ICD-10-CM

## 2019-11-30 DIAGNOSIS — U071 COVID-19: Secondary | ICD-10-CM | POA: Diagnosis present

## 2019-11-30 DIAGNOSIS — Z880 Allergy status to penicillin: Secondary | ICD-10-CM | POA: Diagnosis not present

## 2019-11-30 LAB — URINALYSIS, ROUTINE W REFLEX MICROSCOPIC
Bacteria, UA: NONE SEEN
Bilirubin Urine: NEGATIVE
Glucose, UA: 50 mg/dL — AB
Hgb urine dipstick: NEGATIVE
Ketones, ur: NEGATIVE mg/dL
Leukocytes,Ua: NEGATIVE
Nitrite: NEGATIVE
Protein, ur: 30 mg/dL — AB
Specific Gravity, Urine: 1.005 (ref 1.005–1.030)
Squamous Epithelial / HPF: NONE SEEN (ref 0–5)
pH: 6 (ref 5.0–8.0)

## 2019-11-30 LAB — CBC
HCT: 56.9 % — ABNORMAL HIGH (ref 39.0–52.0)
Hemoglobin: 17.1 g/dL — ABNORMAL HIGH (ref 13.0–17.0)
MCH: 20.6 pg — ABNORMAL LOW (ref 26.0–34.0)
MCHC: 30.1 g/dL (ref 30.0–36.0)
MCV: 68.6 fL — ABNORMAL LOW (ref 80.0–100.0)
Platelets: 638 10*3/uL — ABNORMAL HIGH (ref 150–400)
RBC: 8.3 MIL/uL — ABNORMAL HIGH (ref 4.22–5.81)
RDW: 27.9 % — ABNORMAL HIGH (ref 11.5–15.5)
WBC: 13.3 10*3/uL — ABNORMAL HIGH (ref 4.0–10.5)
nRBC: 0 % (ref 0.0–0.2)

## 2019-11-30 LAB — LACTATE DEHYDROGENASE: LDH: 225 U/L — ABNORMAL HIGH (ref 98–192)

## 2019-11-30 LAB — APTT
aPTT: 37 seconds — ABNORMAL HIGH (ref 24–36)
aPTT: 115 seconds — ABNORMAL HIGH (ref 24–36)

## 2019-11-30 LAB — URINE DRUG SCREEN, QUALITATIVE (ARMC ONLY)
Amphetamines, Ur Screen: NOT DETECTED
Barbiturates, Ur Screen: NOT DETECTED
Benzodiazepine, Ur Scrn: NOT DETECTED
Cannabinoid 50 Ng, Ur ~~LOC~~: POSITIVE — AB
Cocaine Metabolite,Ur ~~LOC~~: NOT DETECTED
MDMA (Ecstasy)Ur Screen: NOT DETECTED
Methadone Scn, Ur: NOT DETECTED
Opiate, Ur Screen: NOT DETECTED
Phencyclidine (PCP) Ur S: NOT DETECTED
Tricyclic, Ur Screen: NOT DETECTED

## 2019-11-30 LAB — FIBRINOGEN: Fibrinogen: 319 mg/dL (ref 210–475)

## 2019-11-30 LAB — HEPARIN LEVEL (UNFRACTIONATED): Heparin Unfractionated: 2.14 IU/mL — ABNORMAL HIGH (ref 0.30–0.70)

## 2019-11-30 LAB — RESPIRATORY PANEL BY RT PCR (FLU A&B, COVID)
Influenza A by PCR: NEGATIVE
Influenza B by PCR: NEGATIVE
SARS Coronavirus 2 by RT PCR: POSITIVE — AB

## 2019-11-30 LAB — BASIC METABOLIC PANEL
Anion gap: 17 — ABNORMAL HIGH (ref 5–15)
BUN: 10 mg/dL (ref 8–23)
CO2: 18 mmol/L — ABNORMAL LOW (ref 22–32)
Calcium: 9 mg/dL (ref 8.9–10.3)
Chloride: 107 mmol/L (ref 98–111)
Creatinine, Ser: 1.02 mg/dL (ref 0.61–1.24)
GFR calc Af Amer: >60 mL/min (ref >60)
GFR calc non Af Amer: >60 mL/min (ref >60)
Glucose, Bld: 202 mg/dL — ABNORMAL HIGH (ref 70–99)
Potassium: 4.6 mmol/L (ref 3.5–5.1)
Sodium: 142 mmol/L (ref 135–145)

## 2019-11-30 LAB — TROPONIN I (HIGH SENSITIVITY)
Troponin I (High Sensitivity): 48 ng/L — ABNORMAL HIGH (ref <18)
Troponin I (High Sensitivity): 90 ng/L — ABNORMAL HIGH (ref <18)
Troponin I (High Sensitivity): 168 ng/L (ref <18)
Troponin I (High Sensitivity): 191 ng/L (ref <18)

## 2019-11-30 LAB — FERRITIN: Ferritin: 17 ng/mL — ABNORMAL LOW (ref 24–336)

## 2019-11-30 LAB — HEPATIC FUNCTION PANEL
ALT: 80 U/L — ABNORMAL HIGH (ref 0–44)
AST: 67 U/L — ABNORMAL HIGH (ref 15–41)
Albumin: 3.3 g/dL — ABNORMAL LOW (ref 3.5–5.0)
Alkaline Phosphatase: 100 U/L (ref 38–126)
Bilirubin, Direct: 0.1 mg/dL (ref 0.0–0.2)
Indirect Bilirubin: 1.2 mg/dL — ABNORMAL HIGH (ref 0.3–0.9)
Total Bilirubin: 1.3 mg/dL — ABNORMAL HIGH (ref 0.3–1.2)
Total Protein: 6.8 g/dL (ref 6.5–8.1)

## 2019-11-30 LAB — GLUCOSE, CAPILLARY
Glucose-Capillary: 102 mg/dL — ABNORMAL HIGH (ref 70–99)
Glucose-Capillary: 100 mg/dL — ABNORMAL HIGH (ref 70–99)
Glucose-Capillary: 144 mg/dL — ABNORMAL HIGH (ref 70–99)

## 2019-11-30 LAB — PROTIME-INR
INR: 1.3 — ABNORMAL HIGH (ref 0.8–1.2)
Prothrombin Time: 15.9 seconds — ABNORMAL HIGH (ref 11.4–15.2)

## 2019-11-30 LAB — FIBRIN DERIVATIVES D-DIMER (ARMC ONLY): Fibrin derivatives D-dimer (ARMC): 2828.08 ng/mL (FEU) — ABNORMAL HIGH (ref 0.00–499.00)

## 2019-11-30 LAB — BRAIN NATRIURETIC PEPTIDE: B Natriuretic Peptide: 2540.4 pg/mL — ABNORMAL HIGH (ref 0.0–100.0)

## 2019-11-30 LAB — PROCALCITONIN: Procalcitonin: 1.64 ng/mL

## 2019-11-30 MED ORDER — ALBUTEROL SULFATE (2.5 MG/3ML) 0.083% IN NEBU: 2.5000 mg | INHALATION_SOLUTION | RESPIRATORY_TRACT | Status: DC | PRN

## 2019-11-30 MED ORDER — HYDRALAZINE HCL 20 MG/ML IJ SOLN: 5.0000 mg | INTRAMUSCULAR | Status: DC | PRN

## 2019-11-30 MED ORDER — SACUBITRIL-VALSARTAN 24-26 MG PO TABS: 1.0000 | ORAL_TABLET | Freq: Two times a day (BID) | ORAL | Status: DC

## 2019-11-30 MED ORDER — DM-GUAIFENESIN ER 30-600 MG PO TB12: 1.0000 | ORAL_TABLET | Freq: Two times a day (BID) | ORAL | Status: DC | PRN

## 2019-11-30 MED ORDER — FUROSEMIDE 10 MG/ML IJ SOLN
40.0000 mg | Freq: Once | INTRAMUSCULAR | Status: AC
  Administered 2019-11-30: 40 mg via INTRAVENOUS

## 2019-11-30 MED ORDER — ALLOPURINOL 300 MG PO TABS
300.0000 mg | ORAL_TABLET | Freq: Every day | ORAL | Status: DC
  Administered 2019-11-30 – 2019-12-02 (×3): 300 mg via ORAL
  Filled 2019-11-30 (×4): qty 1

## 2019-11-30 MED ORDER — SODIUM CHLORIDE 0.9 % IV SOLN: Freq: Once | INTRAVENOUS | Status: AC

## 2019-11-30 MED ORDER — SODIUM CHLORIDE 0.9% FLUSH: 3.0000 mL | INTRAVENOUS | Status: DC | PRN

## 2019-11-30 MED ORDER — ASCORBIC ACID 500 MG PO TABS
500.0000 mg | ORAL_TABLET | Freq: Every day | ORAL | Status: DC
  Administered 2019-11-30 – 2019-12-02 (×3): 500 mg via ORAL
  Filled 2019-11-30 (×3): qty 1

## 2019-11-30 MED ORDER — INSULIN ASPART 100 UNIT/ML ~~LOC~~ SOLN: 0.0000 [IU] | Freq: Every day | SUBCUTANEOUS | Status: DC

## 2019-11-30 MED ORDER — IPRATROPIUM BROMIDE HFA 17 MCG/ACT IN AERS
2.0000 | INHALATION_SPRAY | RESPIRATORY_TRACT | Status: DC
  Administered 2019-11-30 – 2019-12-02 (×11): 2 via RESPIRATORY_TRACT
  Filled 2019-11-30: qty 12.9

## 2019-11-30 MED ORDER — METOPROLOL SUCCINATE ER 100 MG PO TB24
100.0000 mg | ORAL_TABLET | Freq: Every day | ORAL | Status: DC
  Administered 2019-11-30 – 2019-12-02 (×3): 100 mg via ORAL
  Filled 2019-11-30: qty 1
  Filled 2019-11-30 (×2): qty 2

## 2019-11-30 MED ORDER — NITROGLYCERIN 0.4 MG SL SUBL: 0.4000 mg | SUBLINGUAL_TABLET | SUBLINGUAL | Status: DC | PRN

## 2019-11-30 MED ORDER — ZINC SULFATE 220 (50 ZN) MG PO CAPS
220.0000 mg | ORAL_CAPSULE | Freq: Every day | ORAL | Status: DC
  Administered 2019-11-30 – 2019-12-02 (×3): 220 mg via ORAL
  Filled 2019-11-30 (×3): qty 1

## 2019-11-30 MED ORDER — HEPARIN BOLUS VIA INFUSION
4000.0000 [IU] | Freq: Once | INTRAVENOUS | Status: AC
  Administered 2019-11-30: 4000 [IU] via INTRAVENOUS
  Filled 2019-11-30: qty 4000

## 2019-11-30 MED ORDER — ACETAMINOPHEN 325 MG PO TABS: 650.0000 mg | ORAL_TABLET | Freq: Four times a day (QID) | ORAL | Status: DC | PRN

## 2019-11-30 MED ORDER — HYDROXYZINE HCL 50 MG/ML IM SOLN
25.0000 mg | Freq: Four times a day (QID) | INTRAMUSCULAR | Status: DC | PRN
  Filled 2019-11-30: qty 0.5

## 2019-11-30 MED ORDER — FAMOTIDINE 20 MG PO TABS
20.0000 mg | ORAL_TABLET | Freq: Every day | ORAL | Status: DC
  Administered 2019-11-30 – 2019-12-02 (×3): 20 mg via ORAL
  Filled 2019-11-30 (×3): qty 1

## 2019-11-30 MED ORDER — AMIODARONE HCL 200 MG PO TABS
400.0000 mg | ORAL_TABLET | Freq: Three times a day (TID) | ORAL | Status: DC
  Administered 2019-11-30 – 2019-12-02 (×6): 400 mg via ORAL
  Filled 2019-11-30 (×8): qty 2

## 2019-11-30 MED ORDER — METHYLPREDNISOLONE SODIUM SUCC 40 MG IJ SOLR
40.0000 mg | Freq: Two times a day (BID) | INTRAMUSCULAR | Status: DC
  Administered 2019-11-30 – 2019-12-02 (×4): 40 mg via INTRAVENOUS
  Filled 2019-11-30 (×4): qty 1

## 2019-11-30 MED ORDER — SACUBITRIL-VALSARTAN 24-26 MG PO TABS
1.0000 | ORAL_TABLET | Freq: Two times a day (BID) | ORAL | Status: DC
  Administered 2019-11-30 – 2019-12-02 (×4): 1 via ORAL
  Filled 2019-11-30 (×7): qty 1

## 2019-11-30 MED ORDER — SODIUM CHLORIDE 0.9 % IV SOLN
250.0000 mL | INTRAVENOUS | Status: DC | PRN
  Administered 2019-12-02: 250 mL via INTRAVENOUS

## 2019-11-30 MED ORDER — HEPARIN (PORCINE) 25000 UT/250ML-% IV SOLN
950.0000 [IU]/h | INTRAVENOUS | Status: DC
  Administered 2019-11-30: 1000 [IU]/h via INTRAVENOUS
  Filled 2019-11-30: qty 250

## 2019-11-30 MED ORDER — ONDANSETRON HCL 4 MG/2ML IJ SOLN: 4.0000 mg | Freq: Three times a day (TID) | INTRAMUSCULAR | Status: DC | PRN

## 2019-11-30 MED ORDER — APIXABAN 5 MG PO TABS: 5.0000 mg | ORAL_TABLET | Freq: Two times a day (BID) | ORAL | Status: DC

## 2019-11-30 MED ORDER — SODIUM CHLORIDE 0.9 % IV SOLN
100.0000 mg | Freq: Every day | INTRAVENOUS | Status: DC
  Administered 2019-12-01 – 2019-12-02 (×2): 100 mg via INTRAVENOUS
  Filled 2019-11-30 (×2): qty 100

## 2019-11-30 MED ORDER — NITROGLYCERIN 2 % TD OINT
1.0000 [in_us] | TOPICAL_OINTMENT | Freq: Once | TRANSDERMAL | Status: AC
  Administered 2019-11-30: 1 [in_us] via TOPICAL

## 2019-11-30 MED ORDER — ALBUTEROL SULFATE HFA 108 (90 BASE) MCG/ACT IN AERS
2.0000 | INHALATION_SPRAY | RESPIRATORY_TRACT | Status: DC | PRN
  Filled 2019-11-30: qty 6.7

## 2019-11-30 MED ORDER — NICOTINE 21 MG/24HR TD PT24
21.0000 mg | MEDICATED_PATCH | Freq: Every day | TRANSDERMAL | Status: DC
  Administered 2019-11-30 – 2019-12-02 (×3): 21 mg via TRANSDERMAL
  Filled 2019-11-30 (×3): qty 1

## 2019-11-30 MED ORDER — SODIUM CHLORIDE 0.9% FLUSH
3.0000 mL | Freq: Two times a day (BID) | INTRAVENOUS | Status: DC
  Administered 2019-11-30 – 2019-12-02 (×3): 3 mL via INTRAVENOUS

## 2019-11-30 MED ORDER — ATORVASTATIN CALCIUM 20 MG PO TABS
40.0000 mg | ORAL_TABLET | Freq: Every day | ORAL | Status: DC
  Administered 2019-11-30 – 2019-12-02 (×3): 40 mg via ORAL
  Filled 2019-11-30 (×3): qty 2

## 2019-11-30 MED ORDER — BARICITINIB 2 MG PO TABS
4.0000 mg | ORAL_TABLET | Freq: Every day | ORAL | Status: DC
  Administered 2019-11-30 – 2019-12-02 (×3): 4 mg via ORAL
  Filled 2019-11-30 (×3): qty 2

## 2019-11-30 MED ORDER — SPIRONOLACTONE 25 MG PO TABS
25.0000 mg | ORAL_TABLET | Freq: Every day | ORAL | Status: DC
  Administered 2019-11-30 – 2019-12-02 (×3): 25 mg via ORAL
  Filled 2019-11-30 (×4): qty 1

## 2019-11-30 MED ORDER — FUROSEMIDE 10 MG/ML IJ SOLN
40.0000 mg | Freq: Two times a day (BID) | INTRAMUSCULAR | Status: DC
  Administered 2019-11-30 – 2019-12-02 (×4): 40 mg via INTRAVENOUS
  Filled 2019-11-30 (×4): qty 4

## 2019-11-30 MED ORDER — METOPROLOL SUCCINATE ER 50 MG PO TB24: 25.0000 mg | ORAL_TABLET | Freq: Every day | ORAL | Status: DC

## 2019-11-30 MED ORDER — COLCHICINE 0.6 MG PO TABS
0.6000 mg | ORAL_TABLET | Freq: Every day | ORAL | Status: DC | PRN
  Filled 2019-11-30: qty 1

## 2019-11-30 MED ORDER — INSULIN ASPART 100 UNIT/ML ~~LOC~~ SOLN
0.0000 [IU] | Freq: Three times a day (TID) | SUBCUTANEOUS | Status: DC
  Administered 2019-12-01: 2 [IU] via SUBCUTANEOUS
  Administered 2019-12-01: 5 [IU] via SUBCUTANEOUS
  Administered 2019-12-01: 3 [IU] via SUBCUTANEOUS
  Administered 2019-12-02: 2 [IU] via SUBCUTANEOUS
  Filled 2019-11-30 (×4): qty 1

## 2019-11-30 MED ORDER — CYCLOBENZAPRINE HCL 10 MG PO TABS: 10.0000 mg | ORAL_TABLET | Freq: Three times a day (TID) | ORAL | Status: DC | PRN

## 2019-11-30 MED ORDER — LORAZEPAM 2 MG/ML IJ SOLN
0.5000 mg | Freq: Once | INTRAMUSCULAR | Status: AC
  Administered 2019-11-30: 0.5 mg via INTRAVENOUS

## 2019-11-30 MED ORDER — SODIUM CHLORIDE 0.9 % IV SOLN
200.0000 mg | Freq: Once | INTRAVENOUS | Status: AC
  Administered 2019-11-30: 200 mg via INTRAVENOUS
  Filled 2019-11-30: qty 200

## 2019-11-30 MED ORDER — ASPIRIN EC 81 MG PO TBEC
81.0000 mg | DELAYED_RELEASE_TABLET | Freq: Every day | ORAL | Status: DC
  Administered 2019-11-30 – 2019-12-02 (×3): 81 mg via ORAL
  Filled 2019-11-30 (×3): qty 1

## 2019-11-30 MED ORDER — HEPARIN BOLUS VIA INFUSION: 4000.0000 [IU] | Freq: Once | INTRAVENOUS | Status: DC

## 2019-11-30 MED ORDER — HEPARIN (PORCINE) 25000 UT/250ML-% IV SOLN: 900.0000 [IU]/h | INTRAVENOUS | Status: DC

## 2019-11-30 NOTE — Consult Note (Signed)
Travis Maxwell is a 69 y.o. male  [id number]  Primary Cardiologist: Adrian Blackwater Reason for Consultation: Congestive heart failure  HPI: This is a 69 year old African-American male with a history of PCI and stenting in 2019 for mid RCA with severe LV dysfunction and aortic valve replacement with bioprosthesis and hypertension presented to the hospital with severe shortness of breath but no chest pain.  Dr. Okey Dupre saw him for STEMI evaluation and it was felt that the patient had minimal ST elevation and I was asked to evaluate the patient.   Review of Systems: No chest pain  Past Medical History:  Diagnosis Date  . CAD (coronary artery disease)   . Chronic systolic (congestive) heart failure (HCC)   . Diabetes mellitus without complication (HCC)   . GERD (gastroesophageal reflux disease)   . Gout   . Hx of aortic valve replacement    Bioprosthetic valve  . Hypertension     (Not in a hospital admission)    . amiodarone  400 mg Oral TID  . apixaban  5 mg Oral BID  . aspirin EC  81 mg Oral Daily  . atorvastatin  40 mg Oral Daily  . insulin aspart  0-5 Units Subcutaneous QHS  . insulin aspart  0-9 Units Subcutaneous TID WC  . metoprolol succinate  25 mg Oral Daily  . nicotine  21 mg Transdermal Daily    Infusions:   Allergies  Allergen Reactions  . Penicillins Other (See Comments)    Has patient had a PCN reaction causing immediate rash, facial/tongue/throat swelling, SOB or lightheadedness with hypotension: Unknown Has patient had a PCN reaction causing severe rash involving mucus membranes or skin necrosis: Unknown Has patient had a PCN reaction that required hospitalization: Unknown Has patient had a PCN reaction occurring within the last 10 years: No If all of the above answers are "NO", then may proceed with Cephalosporin use.     Social History   Socioeconomic History  . Marital status: Divorced    Spouse name: Not on file  . Number of children: Not on file   . Years of education: Not on file  . Highest education level: Not on file  Occupational History  . Not on file  Tobacco Use  . Smoking status: Current Every Day Smoker    Packs/day: 0.50    Types: Cigarettes    Last attempt to quit: 08/10/2017    Years since quitting: 2.3  . Smokeless tobacco: Never Used  Vaping Use  . Vaping Use: Never used  Substance and Sexual Activity  . Alcohol use: Not Currently  . Drug use: Yes    Types: Marijuana    Comment: today  . Sexual activity: Not on file  Other Topics Concern  . Not on file  Social History Narrative  . Not on file   Social Determinants of Health   Financial Resource Strain:   . Difficulty of Paying Living Expenses: Not on file  Food Insecurity:   . Worried About Programme researcher, broadcasting/film/video in the Last Year: Not on file  . Ran Out of Food in the Last Year: Not on file  Transportation Needs:   . Lack of Transportation (Medical): Not on file  . Lack of Transportation (Non-Medical): Not on file  Physical Activity:   . Days of Exercise per Week: Not on file  . Minutes of Exercise per Session: Not on file  Stress:   . Feeling of Stress : Not on file  Social  Connections:   . Frequency of Communication with Friends and Family: Not on file  . Frequency of Social Gatherings with Friends and Family: Not on file  . Attends Religious Services: Not on file  . Active Member of Clubs or Organizations: Not on file  . Attends Banker Meetings: Not on file  . Marital Status: Not on file  Intimate Partner Violence:   . Fear of Current or Ex-Partner: Not on file  . Emotionally Abused: Not on file  . Physically Abused: Not on file  . Sexually Abused: Not on file    Family History  Problem Relation Age of Onset  . CAD Mother   . CAD Father     PHYSICAL EXAM: Vitals:   11/30/19 1315 11/30/19 1330  BP:  123/90  Pulse: 93 93  Resp: (!) 21 20  Temp:    SpO2: 100% 92%     Intake/Output Summary (Last 24 hours) at  11/30/2019 1346 Last data filed at 11/30/2019 1134 Gross per 24 hour  Intake --  Output 700 ml  Net -700 ml    General:  Well appearing. No respiratory difficulty HEENT: normal Neck: supple. no JVD. Carotids 2+ bilat; no bruits. No lymphadenopathy or thryomegaly appreciated. Cor: PMI nondisplaced. Regular rate & rhythm. No rubs, gallops or murmurs. Lungs: clear Abdomen: soft, nontender, nondistended. No hepatosplenomegaly. No bruits or masses. Good bowel sounds. Extremities: no cyanosis, clubbing, rash, edema Neuro: alert & oriented x 3, cranial nerves grossly intact. moves all 4 extremities w/o difficulty. Affect pleasant.  ECG: A. fib with nonspecific ST-T changes and right bundle branch block  Results for orders placed or performed during the hospital encounter of 11/30/19 (from the past 24 hour(s))  Basic metabolic panel     Status: Abnormal   Collection Time: 11/30/19  8:00 AM  Result Value Ref Range   Sodium 142 135 - 145 mmol/L   Potassium 4.6 3.5 - 5.1 mmol/L   Chloride 107 98 - 111 mmol/L   CO2 18 (L) 22 - 32 mmol/L   Glucose, Bld 202 (H) 70 - 99 mg/dL   BUN 10 8 - 23 mg/dL   Creatinine, Ser 6.21 0.61 - 1.24 mg/dL   Calcium 9.0 8.9 - 30.8 mg/dL   GFR calc non Af Amer >60 >60 mL/min   GFR calc Af Amer >60 >60 mL/min   Anion gap 17 (H) 5 - 15  Troponin I (High Sensitivity)     Status: Abnormal   Collection Time: 11/30/19  8:00 AM  Result Value Ref Range   Troponin I (High Sensitivity) 48 (H) <18 ng/L  CBC     Status: Abnormal   Collection Time: 11/30/19  8:00 AM  Result Value Ref Range   WBC 13.3 (H) 4.0 - 10.5 K/uL   RBC 8.30 (H) 4.22 - 5.81 MIL/uL   Hemoglobin 17.1 (H) 13.0 - 17.0 g/dL   HCT 65.7 (H) 39 - 52 %   MCV 68.6 (L) 80.0 - 100.0 fL   MCH 20.6 (L) 26.0 - 34.0 pg   MCHC 30.1 30.0 - 36.0 g/dL   RDW 84.6 (H) 96.2 - 95.2 %   Platelets 638 (H) 150 - 400 K/uL   nRBC 0.0 0.0 - 0.2 %  Brain natriuretic peptide (order if patient c/o SOB ONLY)     Status:  Abnormal   Collection Time: 11/30/19  8:00 AM  Result Value Ref Range   B Natriuretic Peptide 2,540.4 (H) 0.0 - 100.0 pg/mL  Protime-INR  Status: Abnormal   Collection Time: 11/30/19  8:00 AM  Result Value Ref Range   Prothrombin Time 15.9 (H) 11.4 - 15.2 seconds   INR 1.3 (H) 0.8 - 1.2  Respiratory Panel by RT PCR (Flu A&B, Covid) - Nasopharyngeal Swab     Status: Abnormal   Collection Time: 11/30/19  8:00 AM   Specimen: Nasopharyngeal Swab  Result Value Ref Range   SARS Coronavirus 2 by RT PCR POSITIVE (A) NEGATIVE   Influenza A by PCR NEGATIVE NEGATIVE   Influenza B by PCR NEGATIVE NEGATIVE  Urinalysis, Routine w reflex microscopic     Status: Abnormal   Collection Time: 11/30/19  9:55 AM  Result Value Ref Range   Color, Urine STRAW (A) YELLOW   APPearance CLEAR (A) CLEAR   Specific Gravity, Urine 1.005 1.005 - 1.030   pH 6.0 5.0 - 8.0   Glucose, UA 50 (A) NEGATIVE mg/dL   Hgb urine dipstick NEGATIVE NEGATIVE   Bilirubin Urine NEGATIVE NEGATIVE   Ketones, ur NEGATIVE NEGATIVE mg/dL   Protein, ur 30 (A) NEGATIVE mg/dL   Nitrite NEGATIVE NEGATIVE   Leukocytes,Ua NEGATIVE NEGATIVE   RBC / HPF 0-5 0 - 5 RBC/hpf   WBC, UA 0-5 0 - 5 WBC/hpf   Bacteria, UA NONE SEEN NONE SEEN   Squamous Epithelial / LPF NONE SEEN 0 - 5   Mucus PRESENT   Troponin I (High Sensitivity)     Status: Abnormal   Collection Time: 11/30/19  9:55 AM  Result Value Ref Range   Troponin I (High Sensitivity) 90 (H) <18 ng/L  Glucose, capillary     Status: Abnormal   Collection Time: 11/30/19 12:53 PM  Result Value Ref Range   Glucose-Capillary 102 (H) 70 - 99 mg/dL   DG Chest Portable 1 View  Result Date: 11/30/2019 CLINICAL DATA:  Shortness of breath.  Code STEMI. EXAM: PORTABLE CHEST 1 VIEW COMPARISON:  08/13/2019 FINDINGS: Enlarged cardiac silhouette. Postsurgical changes of aortic valve replacement with sternotomy wires. Interval development of diffuse interstitial prominence with patchy airspace  opacities. No visible pleural effusions. No pneumothorax. 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 No acute osseous abnormality. IMPRESSION: 1. Interval development of pulmonary vascular congestion and diffuse interstitial prominence with patchy airspace opacities, compatible with pulmonary edema. Infection is not excluded. 2. Cardiomegaly. Electronically Signed   By: Feliberto Harts MD   On: 11/30/2019 08:19     ASSESSMENT AND PLAN: Congestive heart failure with severe LV dysfunction and coronary artery disease status post PCI and stenting in 2019 presented with exacerbation of CHF with HFrEF.  Advise adding spironolactone and Entresto.  Advise getting echocardiogram.  Waddell Iten A

## 2019-11-30 NOTE — ED Notes (Signed)
Called pharm. Pharm stated good to go ahead and give pt both lasix IV and spironolactone 25mg  PO. Given.

## 2019-11-30 NOTE — ED Notes (Signed)
Pt given dinner tray; HOB adjusted for pt. BG 100. Denies CP. Resp reg/unlabored.

## 2019-11-30 NOTE — ED Triage Notes (Addendum)
Pt arrives via EMS with c/o sudden SOB and CP. EMS states pt very diaphoretic and then placed on NRB 91-92%-10L. BP-176/110, HR-130. EMS gave pt 2 nitro sprays and 324 aspirin.  Pt arrives on NRB, diaphoretic and unable to sit still. MD Bradler and DR End at bedside.  Pt states he does smoke marijuana and just smoke some prior to this SOB. Pt states 10/10 pain

## 2019-11-30 NOTE — ED Notes (Signed)
Daughter updated on pts plan of care. Verbal consent given from pt

## 2019-11-30 NOTE — ED Notes (Signed)
Called lab to add drug screen to urine previously sent. Stated they'll add it on now.

## 2019-11-30 NOTE — ED Notes (Signed)
Lights dimmed for pt. Call bell within reach. Bed locked low. Rails up.

## 2019-11-30 NOTE — ED Notes (Addendum)
Pharmacy to verify medications and send up spoke with Trinna Post

## 2019-11-30 NOTE — Consult Note (Signed)
ANTICOAGULATION CONSULT NOTE - Initial Consult  Pharmacy Consult for heparin Indication: chest pain/ACS  Allergies  Allergen Reactions  . Penicillins Other (See Comments)    Has patient had a PCN reaction causing immediate rash, facial/tongue/throat swelling, SOB or lightheadedness with hypotension: Unknown Has patient had a PCN reaction causing severe rash involving mucus membranes or skin necrosis: Unknown Has patient had a PCN reaction that required hospitalization: Unknown Has patient had a PCN reaction occurring within the last 10 years: No If all of the above answers are "NO", then may proceed with Cephalosporin use.     Patient Measurements: Height: 5\' 7"  (170.2 cm) Weight: 74.8 kg (165 lb) IBW/kg (Calculated) : 66.1 Heparin Dosing Weight: 74.8  Vital Signs: Temp: 97.7 F (36.5 C) (09/25 0800) BP: 123/90 (09/25 1330) Pulse Rate: 93 (09/25 1330)  Labs: Recent Labs    11/30/19 0800 11/30/19 0955 11/30/19 1250  HGB 17.1*  --   --   HCT 56.9*  --   --   PLT 638*  --   --   LABPROT 15.9*  --   --   INR 1.3*  --   --   CREATININE 1.02  --   --   TROPONINIHS 48* 90* 168*    Estimated Creatinine Clearance: 65.7 mL/min (by C-G formula based on SCr of 1.02 mg/dL).   Medical History: Past Medical History:  Diagnosis Date  . CAD (coronary artery disease)   . Chronic systolic (congestive) heart failure (HCC)   . Diabetes mellitus without complication (HCC)   . GERD (gastroesophageal reflux disease)   . Gout   . Hx of aortic valve replacement    Bioprosthetic valve  . Hypertension     Assessment: 69 year old male presenting to the ED with shortness of breath. PMH includes HTN, diabetes, GERD, gout, tobacco use, CAD s/p stent, aortic bioprosthetic valve, HF, and a fib. Trops 48>90>168. Pt therapeutic x 2 doses at 1050 units/hr during previous visit in June. Pharmacy consulted to dose heparin for ACS/STEMI.   Goal of Therapy:  Heparin level 0.3-0.7  units/ml Monitor platelets by anticoagulation protocol: Yes   Plan:  Give 4000 units bolus x 1 Start heparin infusion at 1000 units/hr Check anti-Xa level in 6 hours and daily while on heparin Continue to monitor H&H and platelets  July, PharmD Pharmacy Resident  11/30/2019 2:55 PM

## 2019-11-30 NOTE — ED Notes (Signed)
X-ray at bedside

## 2019-11-30 NOTE — ED Notes (Signed)
Pt was asleep upon this RN's entrance to room. Pt coughed up dark brown phlegm.

## 2019-11-30 NOTE — Consult Note (Signed)
Remdesivir - Pharmacy Brief Note   O:  CXR: Interval development of pulmonary vascular congestion and diffuse interstitial prominence with patchy airspace opacities, compatible with pulmonary edema. Infection is not excluded. SpO2: 93% on 2L [place]   A/P:  Remdesivir 200 mg IVPB once followed by 100 mg IVPB daily x 4 days.   Reatha Armour, PharmD Pharmacy Resident  11/30/2019 2:32 PM

## 2019-11-30 NOTE — ED Notes (Signed)
Pt's girlfriend Corrie Dandy called this nurse. Wrote her number down since pt's personal phone out of charge. Pt notified and assisted to call her on hospital phone as requested.

## 2019-11-30 NOTE — ED Notes (Signed)
Dr. End at bedside. 

## 2019-11-30 NOTE — ED Notes (Signed)
Pl placed in gown and pads placed on pt. Pt placed on monitor by EDT Zach and EDT Kayla. RN Shanda Bumps at bedside attempting IV.

## 2019-11-30 NOTE — ED Notes (Signed)
RT called for BIPAP

## 2019-11-30 NOTE — ED Notes (Signed)
RT called 2nd time for BIPAP

## 2019-11-30 NOTE — H&P (Addendum)
History and Physical    Travis Maxwell WUJ:[id number] DOB: 1950-12-15 DOA: 11/30/2019  Referring MD/NP/PA:   PCP: [facility]   Patient coming from:  The patient is coming from home.  At baseline, pt is independent for most of ADL.        Chief Complaint: SOB  HPI: Travis Maxwell is a 69 y.o. male with medical history significant of hypertension, diabetes mellitus, GERD, gout, tobacco abuse, CAD, stent placement, aortic valve replacement with bioprosthetic valve, sCHF with EF 35-40%, PAF on Eliquis, who presents with SOB.  Patient states that his shortness breath started this morning suddenly, which has been progressively worsening. He states that he had some chest discomfort earlier, but currently patient denies chest pain.  He has mild dry cough, no fever or chills.  Denies nausea, vomiting, diarrhea or abdominal pain.  No symptoms of UTI or unilateral weakness. Patient was initially put on nonrebreather with oxygen satting 91 to 92%, and then started BiPAP in ED.  Patient was found to have trop 48 and abnormal EKG with right bundle blockage and isolated ST elevation in lead V3. Dr. Okey Dupre of card from [facility] STEMI team is consulted by EDP. Dr. Okey Dupre did not think patient has STEMI.  ED Course: pt was found to have trop 48, WBC 13.3, INR 1.3, troponin 48, BNP 2540, pending COVID-19 PCR, renal function okay, temperature 97.7, blood pressure 181/127, 149/98, heart rate 128, RR 37, oxygen saturation 99% on BiPAP.  Patient is admitted to progressive bed as inpatient.  Dr. Welton Flakes of cardiology is consulted.  CXR showed: 1. Interval development of pulmonary vascular congestion and diffuse interstitial prominence with patchy airspace opacities, compatible with pulmonary edema. Infection is not excluded. 2. Cardiomegaly.  Review of Systems:   General: no fevers, chills, no body weight gain, has fatigue HEENT: no blurry vision, hearing changes or sore throat Respiratory: has dyspnea,  coughing, no wheezing CV: has chest discomfort, no palpitations GI: no nausea, vomiting, abdominal pain, diarrhea, constipation GU: no dysuria, burning on urination, increased urinary frequency, hematuria  Ext: has leg edema Neuro: no unilateral weakness, numbness, or tingling, no vision change or hearing loss Skin: no rash, no skin tear. MSK: No muscle spasm, no deformity, no limitation of range of movement in spin Heme: No easy bruising.  Travel history: No recent long distant travel.  Allergy:  Allergies  Allergen Reactions  . Penicillins Other (See Comments)    Has patient had a PCN reaction causing immediate rash, facial/tongue/throat swelling, SOB or lightheadedness with hypotension: Unknown Has patient had a PCN reaction causing severe rash involving mucus membranes or skin necrosis: Unknown Has patient had a PCN reaction that required hospitalization: Unknown Has patient had a PCN reaction occurring within the last 10 years: No If all of the above answers are "NO", then may proceed with Cephalosporin use.     Past Medical History:  Diagnosis Date  . CAD (coronary artery disease)   . Chronic systolic (congestive) heart failure (HCC)   . Diabetes mellitus without complication (HCC)   . GERD (gastroesophageal reflux disease)   . Gout   . Hx of aortic valve replacement    Bioprosthetic valve  . Hypertension     Past Surgical History:  Procedure Laterality Date  . CORONARY STENT INTERVENTION N/A 07/25/2017   Procedure: CORONARY STENT INTERVENTION;  Surgeon: Alwyn Pea, MD;  Location: [facility] INVASIVE CV LAB;  Service: Cardiovascular;  Laterality: N/A;  . LEFT HEART CATH AND CORONARY ANGIOGRAPHY N/A  07/25/2017   Procedure: LEFT HEART CATH AND CORONARY ANGIOGRAPHY;  Surgeon: Lamar Blinks, MD;  Location: [facility] INVASIVE CV LAB;  Service: Cardiovascular;  Laterality: N/A;    Social History:  reports that he has been smoking cigarettes. He has been smoking about 0.50  packs per day. He has never used smokeless tobacco. He reports previous alcohol use. He reports current drug use. Drug: Marijuana.  Family History:  Family History  Problem Relation Age of Onset  . CAD Mother   . CAD Father      Prior to Admission medications   Medication Sig Start Date End Date Taking? Authorizing Provider  allopurinol (ZYLOPRIM) 300 MG tablet Take 300 mg by mouth daily. 03/22/18   [provider]  apixaban (ELIQUIS) 5 MG TABS tablet Take 1 tablet (5 mg total) by mouth 2 (two) times daily. 06/13/19   Menshew, Charlesetta Ivory, PA-C  aspirin EC 81 MG EC tablet Take 1 tablet (81 mg total) by mouth daily. 08/15/19   Albertine Grates, MD  atorvastatin (LIPITOR) 40 MG tablet Take 1 tablet (40 mg total) by mouth daily. 08/15/19   Albertine Grates, MD  carvedilol (COREG) 12.5 MG tablet Take 12.5 mg by mouth 2 (two) times a day. 03/21/18   [provider]  colchicine 0.6 MG tablet Take 2 tabs PO x 1, then 1 tab PO 1 hour later x 1 Max: 1.8 mg total dose per attack, do not repeat within 3 days. 06/13/19   Menshew, Charlesetta Ivory, PA-C  furosemide (LASIX) 20 MG tablet Take 1 tablet (20 mg total) by mouth daily. 08/15/19   Albertine Grates, MD  lisinopril (ZESTRIL) 10 MG tablet Take 1 tablet (10 mg total) by mouth daily. 06/13/19 08/13/19  Menshew, Charlesetta Ivory, PA-C  nitroGLYCERIN (NITROSTAT) 0.4 MG SL tablet Place 1 tablet (0.4 mg total) under the tongue every 5 (five) minutes as needed for chest pain. 08/14/19   Albertine Grates, MD    Physical Exam: Vitals:   11/30/19 1845 11/30/19 1900 11/30/19 1915 11/30/19 1930  BP:  122/78  (!) 129/92  Pulse:  92  91  Resp: (!) 22   (!) 28  Temp:      SpO2: 97%  98% 96%  Weight:      Height:       General: Not in acute distress HEENT:       Eyes: PERRL, EOMI, no scleral icterus.       ENT: No discharge from the ears and nose, no pharynx injection, no tonsillar enlargement.        Neck: Positive JVD, no bruit, no mass felt. Heme: No neck lymph node  enlargement. Cardiac: S1/S2, RRR, No murmurs, No gallops or rubs. Respiratory: Has crackles bilaterally  GI: Soft, nondistended, nontender, no rebound pain, no organomegaly, BS present. GU: No hematuria Ext: 1+ pitting leg edema bilaterally. 1+DP/PT pulse bilaterally. Musculoskeletal: No joint deformities, No joint redness or warmth, no limitation of ROM in spin. Skin: No rashes.  Neuro: Alert, oriented X3, cranial nerves II-XII grossly intact, moves all extremities normally.  Psych: Patient is not psychotic, no suicidal or hemocidal ideation.  Labs on Admission: I have personally reviewed following labs and imaging studies  CBC: Recent Labs  Lab 11/30/19 0800  WBC 13.3*  HGB 17.1*  HCT 56.9*  MCV 68.6*  PLT 638*   Basic Metabolic Panel: Recent Labs  Lab 11/30/19 0800  NA 142  K 4.6  CL 107  CO2 18*  GLUCOSE  202*  BUN 10  CREATININE 1.02  CALCIUM 9.0   GFR: Estimated Creatinine Clearance: 65.7 mL/min (by C-G formula based on SCr of 1.02 mg/dL). Liver Function Tests: Recent Labs  Lab 11/30/19 1626  AST 67*  ALT 80*  ALKPHOS 100  BILITOT 1.3*  PROT 6.8  ALBUMIN 3.3*   No results for input(s): LIPASE, AMYLASE in the last 168 hours. No results for input(s): AMMONIA in the last 168 hours. Coagulation Profile: Recent Labs  Lab 11/30/19 0800  INR 1.3*   Cardiac Enzymes: No results for input(s): CKTOTAL, CKMB, CKMBINDEX, TROPONINI in the last 168 hours. BNP (last 3 results) No results for input(s): PROBNP in the last 8760 hours. HbA1C: No results for input(s): HGBA1C in the last 72 hours. CBG: Recent Labs  Lab 11/30/19 1253 11/30/19 1836  GLUCAP 102* 100*   Lipid Profile: No results for input(s): CHOL, HDL, LDLCALC, TRIG, CHOLHDL, LDLDIRECT in the last 72 hours. Thyroid Function Tests: No results for input(s): TSH, T4TOTAL, FREET4, T3FREE, THYROIDAB in the last 72 hours. Anemia Panel: Recent Labs    11/30/19 1626  FERRITIN 17*   Urine  analysis:    Component Value Date/Time   COLORURINE STRAW (A) 11/30/2019 0955   APPEARANCEUR CLEAR (A) 11/30/2019 0955   LABSPEC 1.005 11/30/2019 0955   PHURINE 6.0 11/30/2019 0955   GLUCOSEU 50 (A) 11/30/2019 0955   HGBUR NEGATIVE 11/30/2019 0955   BILIRUBINUR NEGATIVE 11/30/2019 0955   KETONESUR NEGATIVE 11/30/2019 0955   PROTEINUR 30 (A) 11/30/2019 0955   NITRITE NEGATIVE 11/30/2019 0955   LEUKOCYTESUR NEGATIVE 11/30/2019 0955   Sepsis Labs: @LABRCNTIP (procalcitonin:4,lacticidven:4) ) Recent Results (from the past 240 hour(s))  Respiratory Panel by RT PCR (Flu A&B, Covid) - Nasopharyngeal Swab     Status: Abnormal   Collection Time: 11/30/19  8:00 AM   Specimen: Nasopharyngeal Swab  Result Value Ref Range Status   SARS Coronavirus 2 by RT PCR POSITIVE (A) NEGATIVE Final    Comment: RESULT CALLED TO, READ BACK BY AND VERIFIED WITH:  BRANDY DAVIS AT 1056 11/30/19 SDR (NOTE) SARS-CoV-2 target nucleic acids are DETECTED.  SARS-CoV-2 RNA is generally detectable in upper respiratory specimens  during the acute phase of infection. Positive results are indicative of the presence of the identified virus, but do not rule out bacterial infection or co-infection with other pathogens not detected by the test. Clinical correlation with patient history and other diagnostic information is necessary to determine patient infection status. The expected result is Negative.  Fact Sheet for Patients:  12/02/19  Fact Sheet for Healthcare Providers: https://www.moore.com/  This test is not yet approved or cleared by the https://www.young.biz/ FDA and  has been authorized for detection and/or diagnosis of SARS-CoV-2 by FDA under an Emergency Use Authorization (EUA).  This EUA will remain in effect (meaning this test can be u sed) for the duration of  the COVID-19 declaration under Section 564(b)(1) of the Act, 21 U.S.C. section 360bbb-3(b)(1),  unless the authorization is terminated or revoked sooner.      Influenza A by PCR NEGATIVE NEGATIVE Final   Influenza B by PCR NEGATIVE NEGATIVE Final    Comment: (NOTE) The Xpert Xpress SARS-CoV-2/FLU/RSV assay is intended as an aid in  the diagnosis of influenza from Nasopharyngeal swab specimens and  should not be used as a sole basis for treatment. Nasal washings and  aspirates are unacceptable for Xpert Xpress SARS-CoV-2/FLU/RSV  testing.  Fact Sheet for Patients: [place]  Fact Sheet for Healthcare Providers: https://www.moore.com/  This test is not yet approved or cleared by the [place] and  has been authorized for detection and/or diagnosis of SARS-CoV-2 by  FDA under an Emergency Use Authorization (EUA). This EUA will remain  in effect (meaning this test can be used) for the duration of the  Covid-19 declaration under Section 564(b)(1) of the Act, 21  U.S.C. section 360bbb-3(b)(1), unless the authorization is  terminated or revoked. Performed at [facility], [address], [place], [place] [address]      Radiological Exams on Admission: DG Chest Portable 1 View  Result Date: 11/30/2019 CLINICAL DATA:  Shortness of breath.  Code STEMI. EXAM: PORTABLE CHEST 1 VIEW COMPARISON:  08/13/2019 FINDINGS: Enlarged cardiac silhouette. Postsurgical changes of aortic valve replacement with sternotomy wires. Interval development of diffuse interstitial prominence with patchy airspace opacities. No visible pleural effusions. No pneumothorax. 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 No acute osseous abnormality. IMPRESSION: 1. Interval development of pulmonary vascular congestion and diffuse interstitial prominence with patchy airspace opacities, compatible with pulmonary edema. Infection is not excluded. 2. Cardiomegaly. Electronically Signed   By:  Feliberto Harts MD   On: 11/30/2019 08:19     EKG: Independently reviewed.  Sinus rhythm, tachycardia, QTC 595, right bundle blockade, poor R wave progression, left axis deviation  Assessment/Plan Principal Problem:   Acute on chronic systolic CHF (congestive heart failure) (HCC) Active Problems:   Diabetes mellitus without complication (HCC)   Gout   Hypertension   Tobacco abuse   CAD (coronary artery disease)   Elevated troponin   Leukocytosis   PAF (paroxysmal atrial fibrillation) (HCC)   Acute on chronic systolic (congestive) heart failure (HCC)   Pneumonia due to COVID-19 virus   Acute on chronic systolic CHF: Troponin 48.  2D echo on 08/13/2019 showed EF <20%.  Patient has elevated BNP 2540, crackles on auscultation, pulmonary edema chest x-ray, clinically consistent with CHF exacerbation.  -Will admit to progressive unit as inpatient -Lasix 40 mg bid by IV -pt is on Entresto -Bronchodilators -2d echo -Daily weights -strict I/O's -Low salt diet -Fluid restriction -Obtain REDs Vest reading -Dr. Welton Flakes of cardiology is consulted  Elevated trop and hx of CAD: s/p of stent. Patient's elevated troponin is likely due to demand ischemia.  -Trend troponin -Aspirin, Lipitor, metoprolol -As needed nitroglycerin -Check UDS, A1c, FLP -Repeat EKG in morning -Follow-up 2D echo -Dr. Welton Flakes of cardiology is consulted -switch Eliquis to IV heparin since trop is trending up.  Addendum:  Pneumonia due to COVID-19 infection: CXR showed interval development of pulmonary vascular congestion and diffuse interstitial prominence with patchy airspace opacities.  Oxygen saturation 92 to 99% on room air. -Remdesivir per pharm -Baricitinib -Solumedrol 40 mg bid -vitamin C, zinc.  -Bronchodilators -PRN Mucinex for cough -f/u Blood culture -D-dimer, BNP,Trop, LFT, CRP, LDH, Procalcitonin, Ferritin, fibinogen, TG, Hep B SAg, HIV ab -Daily CRP, Ferritin, D-dimer, -Will ask the patient to  maintain an awake prone position for 16+ hours a day, if possible, with a minimum of 2-3 hours at a time -Will attempt to maintain euvolemia to a net negative fluid status -IF patient deteriorates, will consult PCCM and ID  Diabetes mellitus without complication (HCC): Recent A1c 6.0, well controlled.  Patient is taking Januvia at home -SSI  Gout -Continue colchicine and allopurinol  Hypertension -IV hydralazine as needed -Entresto and metoprolol -Patient is IV Lasix  Tobacco abuse -Nicotine patch  Leukocytosis: WBC 13.3.  No fever.  Possibly  reactive -Follow-up of blood culture and a urinalysis -f/u by CBC  PAF -Eliquis -Metoprolol, amiodarone      DVT ppx: On Eliquis -->swithced to IV heparin. Code Status: Full code Family Communication: not done, no family member is at bed side.  I have tried to call his two daughters without success.  Disposition Plan:  Anticipate discharge back to previous environment Consults called: Cardiology, Dr. END Admission status:   progressive unit as inpt     Status is: Inpatient    Remains inpatient appropriate because:Inpatient level of care appropriate due to severity of illness.  Patient has multiple comorbidities, including CHF with EF less than 20%, who presents with acute on chronic CHF exacerbation.  Elevated BNP up to 2540.  Patient also has elevated troponin and leukocytosis.  His presentation is highly complicated.  He is at high risk of deteriorating.  Patient will need to be treated in hospital for at least 2 days   Dispo: The patient is from: Home              Anticipated d/c is to: Home              Anticipated d/c date is: 2 days              Patient currently is not medically stable to d/c.         Date of Service 11/30/2019    Lorretta Harp [facility]   If 7PM-7AM, please contact night-coverage [email or web address] 11/30/2019, 7:58 PM

## 2019-11-30 NOTE — ED Notes (Signed)
BG 144

## 2019-11-30 NOTE — ED Notes (Signed)
RT at bedside with BIPAP.

## 2019-11-30 NOTE — Significant Event (Addendum)
[facility] STEMI Evaluation  Date: 11/30/19 Time: 8:08 AM  Patient is a 69 year old man with history of CAD status post PCI to RCA in 2019 (subsequently found to be occluded at [facility] in 2020), aortic stenosis and regurgitation status post bioprosthetic AVR at [facility] in 2020, chronic HFrEF, hypertension, hyperlipidemia, and substance abuse, who presented via EMS with acute onset of shortness of breath this morning.  Patient reports that he had been feeling well last night but awoke with acute shortness of breath.  He notes having recently smoked marijuana.  EKG by EMS showed wide-complex tachycardia with concern for inferior ST elevation.  In the ED, Travis Maxwell is in obvious respiratory distress.  He is agitated and only intermittently follows commands.  He complains only of shortness of breath and denies having had any chest pain.  He is notably diaphoretic.  Heart sounds are tachycardic but regular.  I am unable to appreciate murmurs, which may be obscured by lung breath sounds.  Increased work of breathing is noted with widespread rales in both lungs.  Extremities are cool.  EKG shows sinus tachycardia with right bundle branch block and 3 to 4 mm of inferior ST segment elevation.  There is also isolated ST elevation in V3 (question lead placement).  Review of prior tracings as recently as 08/2019 are notable for underlying right bundle branch block with inferior MI and 2 mm of ST segment elevation.  Catheterization in 2019 showed occlusion the mid RCA that was treated with a single 2 mm stent (question if this is a non-dominant RCA or stent placed into acute marginal branch with continued occlusion of dominant mid/distal RCA).  LVEF was moderately to severely reduced at that time with evidence of inferior aneurysm.  Travis Maxwell presentation is most consistent with acute heart failure with marked respiratory distress and crackles in both lungs.  While he does have ST segment elevation on his EKG, predominantly in  the inferior leads, these findings are chronic.  I suspect accentuation of the ST elevation is due to tachycardia, underlying bundle branch block, and inferior wall aneurysm.  I do not think that Travis Maxwell would benefit from emergent cardiac catheterization at this time, as I believe that the risks outweigh the benefits.  In particular, I do not believe that he would tolerate the procedure without intubation and sedation due to his marked respiratory distress.  Ongoing management per the emergency department team and consultation with his primary cardiology team at [facility] are recommended.  If his clinical situation changes and catheterization needs to be reconsidered, please do not hesitate to contact me.  Yvonne Kendall, MD [facility]

## 2019-11-30 NOTE — ED Notes (Signed)
This RN has checked nurses' station, tube station, pyxis, and pt med station and still unable to find many of the scheduled meds. Pharm notified. Will give to pt once received from pharm.

## 2019-11-30 NOTE — ED Notes (Signed)
Dr. Clyde Lundborg informed of pt's increased in troponin to 90 at this time

## 2019-11-30 NOTE — ED Notes (Signed)
Blue top sent to lab. 

## 2019-11-30 NOTE — ED Notes (Signed)
Xray called for 3x and finally got answer they are on way

## 2019-11-30 NOTE — ED Notes (Signed)
1 inch of paste placed left upper chest

## 2019-11-30 NOTE — ED Provider Notes (Signed)
[facility] Emergency Department Provider Note   ____________________________________________   First MD Initiated Contact with Patient 11/30/19 [phone]     (approximate)  I have reviewed the triage vital signs and the nursing notes.   HISTORY  Chief Complaint CODE STEMI    HPI Travis Maxwell is a 69 y.o. male with a history of CAD, aortic valve replacement, and CHF who presents for acute onset shortness of breath that began approximately 1 hour prior to arrival.  Patient arrives via EMS who placed patient on 15 L nonrebreather for hypoxia as well as gave 324 of aspirin and 2 sprays of sublingual nitroglycerin with little relief in patient's shortness of breath.  Patient arrives in acute respiratory distress and unable to answer questions clearly         Past Medical History:  Diagnosis Date  . CAD (coronary artery disease)   . Chronic systolic (congestive) heart failure (HCC)   . Diabetes mellitus without complication (HCC)   . GERD (gastroesophageal reflux disease)   . Gout   . Hx of aortic valve replacement    Bioprosthetic valve  . Hypertension     Patient Active Problem List   Diagnosis Date Noted  . Elevated troponin 11/30/2019  . Leukocytosis 11/30/2019  . PAF (paroxysmal atrial fibrillation) (HCC) 11/30/2019  . Chest pain 08/13/2019  . Diabetes mellitus without complication (HCC)   . Gout   . Hypertension   . Dizziness   . Acute on chronic systolic CHF (congestive heart failure) (HCC)   . Tobacco abuse   . GERD (gastroesophageal reflux disease)   . CAD (coronary artery disease)   . NSTEMI (non-ST elevated myocardial infarction) (HCC) 07/24/2017    Past Surgical History:  Procedure Laterality Date  . CORONARY STENT INTERVENTION N/A 07/25/2017   Procedure: CORONARY STENT INTERVENTION;  Surgeon: Alwyn Pea, MD;  Location: [facility] INVASIVE CV LAB;  Service: Cardiovascular;  Laterality: N/A;  . LEFT HEART CATH AND CORONARY  ANGIOGRAPHY N/A 07/25/2017   Procedure: LEFT HEART CATH AND CORONARY ANGIOGRAPHY;  Surgeon: Lamar Blinks, MD;  Location: [facility] INVASIVE CV LAB;  Service: Cardiovascular;  Laterality: N/A;    Prior to Admission medications   Medication Sig Start Date End Date Taking? Authorizing Provider  allopurinol (ZYLOPRIM) 300 MG tablet Take 300 mg by mouth daily. 03/22/18   [provider]  apixaban (ELIQUIS) 5 MG TABS tablet Take 1 tablet (5 mg total) by mouth 2 (two) times daily. 06/13/19   Menshew, Charlesetta Ivory, PA-C  aspirin EC 81 MG EC tablet Take 1 tablet (81 mg total) by mouth daily. 08/15/19   Albertine Grates, MD  atorvastatin (LIPITOR) 40 MG tablet Take 1 tablet (40 mg total) by mouth daily. 08/15/19   Albertine Grates, MD  carvedilol (COREG) 12.5 MG tablet Take 12.5 mg by mouth 2 (two) times a day. 03/21/18   [provider]  colchicine 0.6 MG tablet Take 2 tabs PO x 1, then 1 tab PO 1 hour later x 1 Max: 1.8 mg total dose per attack, do not repeat within 3 days. 06/13/19   Menshew, Charlesetta Ivory, PA-C  furosemide (LASIX) 20 MG tablet Take 1 tablet (20 mg total) by mouth daily. 08/15/19   Albertine Grates, MD  lisinopril (ZESTRIL) 10 MG tablet Take 1 tablet (10 mg total) by mouth daily. 06/13/19 08/13/19  Menshew, Charlesetta Ivory, PA-C  nitroGLYCERIN (NITROSTAT) 0.4 MG SL tablet Place 1 tablet (0.4 mg total) under the tongue every  5 (five) minutes as needed for chest pain. 08/14/19   Albertine Grates, MD    Allergies Penicillins  Family History  Problem Relation Age of Onset  . CAD Mother   . CAD Father     Social History Social History   Tobacco Use  . Smoking status: Current Every Day Smoker    Packs/day: 0.50    Types: Cigarettes    Last attempt to quit: 08/10/2017    Years since quitting: 2.3  . Smokeless tobacco: Never Used  Vaping Use  . Vaping Use: Never used  Substance Use Topics  . Alcohol use: Not Currently  . Drug use: Yes    Types: Marijuana    Comment: today    Review of  Systems Unable to assess   ____________________________________________   PHYSICAL EXAM:  VITAL SIGNS: ED Triage Vitals  Enc Vitals Group     BP 11/30/19 0745 (!) 181/127     Pulse Rate 11/30/19 0800 (!) 127     Resp 11/30/19 0745 (!) 37     Temp 11/30/19 0800 97.7 F (36.5 C)     Temp src --      SpO2 11/30/19 0800 92 %     Weight 11/30/19 0747 165 lb (74.8 kg)     Height 11/30/19 0747 5\' 7"  (1.702 m)     Head Circumference --      Peak Flow --      Pain Score 11/30/19 0747 9     Pain Loc --      Pain Edu? --      Excl. in GC? --    Constitutional: Alert and oriented. Well appearing and in moderate respiratory distress. Eyes: Conjunctivae are normal. PERRL. Head: Atraumatic. Nose: No congestion/rhinnorhea. Mouth/Throat: Mucous membranes are moist. Neck: No stridor Cardiovascular: Normal rate, regular rhythm. Grossly normal heart sounds.  Good peripheral circulation. Respiratory: Tachypneic, use of accessory muscles.  Rales over bilateral lung fields Gastrointestinal: Soft and nontender. No distention. Musculoskeletal: No lower extremity tenderness nor edema.  No joint effusions. Neurologic:  Normal speech and language. No gross focal neurologic deficits are appreciated. Skin:  Skin is warm and dry. No rash noted. Psychiatric: Mood and affect are normal. Speech and behavior are normal.  ____________________________________________   LABS (all labs ordered are listed, but only abnormal results are displayed)  Labs Reviewed  BASIC METABOLIC PANEL - Abnormal; Notable for the following components:      Result Value   CO2 18 (*)    Glucose, Bld 202 (*)    Anion gap 17 (*)    All other components within normal limits  CBC - Abnormal; Notable for the following components:   WBC 13.3 (*)    RBC 8.30 (*)    Hemoglobin 17.1 (*)    HCT 56.9 (*)    MCV 68.6 (*)    MCH 20.6 (*)    RDW 27.9 (*)    Platelets 638 (*)    All other components within normal limits  BRAIN  NATRIURETIC PEPTIDE - Abnormal; Notable for the following components:   B Natriuretic Peptide 2,540.4 (*)    All other components within normal limits  PROTIME-INR - Abnormal; Notable for the following components:   Prothrombin Time 15.9 (*)    INR 1.3 (*)    All other components within normal limits  TROPONIN I (HIGH SENSITIVITY) - Abnormal; Notable for the following components:   Troponin I (High Sensitivity) 48 (*)    All other components within normal  limits  RESPIRATORY PANEL BY RT PCR (FLU A&B, COVID)  CULTURE, BLOOD (ROUTINE X 2)  CULTURE, BLOOD (ROUTINE X 2)  URINALYSIS, ROUTINE W REFLEX MICROSCOPIC  HEMOGLOBIN A1C  CBG MONITORING, ED  TROPONIN I (HIGH SENSITIVITY)   ____________________________________________  EKG  ED ECG REPORT I, Merwyn Katos, the attending physician, personally viewed and interpreted this ECG.  Date: 11/30/2019 EKG Time: 0748 Rate: 131 Rhythm: Sinus tachycardia QRS Axis: normal Intervals: Right bundle branch block ST/T Wave abnormalities: ST elevations in inferior leads Narrative Interpretation: Compared to patient's baseline EKG, patient does have slightly increased elevations in T waves especially along the inferior distribution.  I spoke with Dr. Okey Dupre, cardiology, who is not concerned for acute STEMI at this time and agrees with plan for improving patient's respiratory status as well as diuresis plan repeat EKG  ____________________________________________  RADIOLOGY  ED MD interpretation: Diffuse pulmonary vascular congestion with patchyopacities concerning for pulmonary edema.  Stable cardiomegaly.  No evidence of pneumothorax or widened mediastinum  Official radiology report(s): DG Chest Portable 1 View  Result Date: 11/30/2019 CLINICAL DATA:  Shortness of breath.  Code STEMI. EXAM: PORTABLE CHEST 1 VIEW COMPARISON:  08/13/2019 FINDINGS: Enlarged cardiac silhouette. Postsurgical changes of aortic valve replacement with sternotomy wires.  Interval development of diffuse interstitial prominence with patchy airspace opacities. No visible pleural effusions. No pneumothorax. 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 No acute osseous abnormality. IMPRESSION: 1. Interval development of pulmonary vascular congestion and diffuse interstitial prominence with patchy airspace opacities, compatible with pulmonary edema. Infection is not excluded. 2. Cardiomegaly. Electronically Signed   By: Feliberto Harts MD   On: 11/30/2019 08:19    ____________________________________________   PROCEDURES  Procedure(s) performed (including Critical Care):  .Critical Care Performed by: Merwyn Katos, MD Authorized by: Merwyn Katos, MD   Critical care provider statement:    Critical care time (minutes):  51   Critical care was necessary to treat or prevent imminent or life-threatening deterioration of the following conditions:  Respiratory failure and cardiac failure   Critical care was time spent personally by me on the following activities:  Discussions with consultants, evaluation of patient's response to treatment, examination of patient, ordering and performing treatments and interventions, ordering and review of laboratory studies, ordering and review of radiographic studies, pulse oximetry, re-evaluation of patient's condition, obtaining history from patient or surrogate and review of old charts   I assumed direction of critical care for this patient from another provider in my specialty: no   .1-3 Lead EKG Interpretation Performed by: Merwyn Katos, MD Authorized by: Merwyn Katos, MD     Interpretation: abnormal     ECG rate:  111   ECG rate assessment: tachycardic     Rhythm: sinus tachycardia     Ectopy: PVCs     Conduction: normal   Comments:     RBBB     ____________________________________________   INITIAL IMPRESSION / ASSESSMENT AND PLAN / ED COURSE  As  part of my medical decision making, I reviewed the following data within the electronic MEDICAL RECORD NUMBER Nursing notes reviewed and incorporated, Labs reviewed, EKG interpreted, Old chart reviewed, Radiograph reviewed and Notes from prior ED visits reviewed and incorporated        Positive dyspnea positive LE edema  + Non adherence to medication regimen  Workup: ECG, CBC, BMP, Troponin, BNP, CXR Findings: EKG: No STEMI and no evidence of Brugadas sign, delta wave, epsilon  wave, significantly prolonged QTc, or malignant arrhythmia. BNP: 2540 CXR: Bilateral pulmonary edema Based on history, exam and findings, presentation most consistent with acute on chronic heart failure. Low suspicion for PNA, ACS, tamponade, aortic dissection. Interventions: Oxygen, Diuresis  Reassessment: Symptoms improved in ED with BiPAP and diuresis  Disposition (Stable but not significantly improved): Admit to medicine for further monitoring and for improvement of medication regimen to control symptoms.      ____________________________________________   FINAL CLINICAL IMPRESSION(S) / ED DIAGNOSES  Final diagnoses:  SOB (shortness of breath)  Acute on chronic congestive heart failure, unspecified heart failure type (HCC)  Acute respiratory failure with hypoxia (HCC)  Acute pulmonary edema [facility])     ED Discharge Orders    None       Note:  This document was prepared using Dragon voice recognition software and may include unintentional dictation errors.   Merwyn Katos, MD 11/30/19 1022

## 2019-11-30 NOTE — Consult Note (Signed)
ANTICOAGULATION CONSULT NOTE  Pharmacy Consult for heparin Indication: chest pain/ACS  Patient Measurements: Height: 5\' 7"  (170.2 cm) Weight: 74.8 kg (165 lb) IBW/kg (Calculated) : 66.1 Heparin Dosing Weight: 74.8  Labs: Recent Labs    11/30/19 0800 11/30/19 0800 11/30/19 0955 11/30/19 1250 11/30/19 1500 11/30/19 1626 11/30/19 2301  HGB 17.1*  --   --   --   --   --   --   HCT 56.9*  --   --   --   --   --   --   PLT 638*  --   --   --   --   --   --   APTT  --   --   --   --  37*  --  115*  LABPROT 15.9*  --   --   --   --   --   --   INR 1.3*  --   --   --   --   --   --   HEPARINUNFRC  --   --   --   --  2.14*  --   --   CREATININE 1.02  --   --   --   --   --   --   TROPONINIHS 48*   < > 90* 168*  --  191*  --    < > = values in this interval not displayed.    Estimated Creatinine Clearance: 65.7 mL/min (by C-G formula based on SCr of 1.02 mg/dL).  Assessment: 69 year old male presenting to the ED with shortness of breath. PMH includes HTN, diabetes, GERD, gout, tobacco use, CAD s/p stent, aortic bioprosthetic valve, HF, and a fib. Trops 48>90>168. Pt therapeutic x 2 doses at 1050 units/hr during previous visit in June. Pharmacy consulted to dose heparin for ACS/STEMI.   Patient on apixaban prior to admission (last dose 11/29/19 PM). Given interference of apixaban with anti-Xa level as evidenced by baseline HL of 2.14, will follow aPTT for now.   Goal of Therapy:  aPTT 66-102 seconds Monitor platelets by anticoagulation protocol: Yes   Plan:  --9/25 at 2301 aPTT = 115, slightly supratherapeutic. Will decrease rate to 900 units/hr --Re-check aPTT in 6 hours --Daily CBC per protocol  10/25 11/30/2019 11:42 PM

## 2019-11-30 NOTE — ED Notes (Addendum)
Called lab and Judeth Cornfield will add on APTT and Heparin level to blood already in lab

## 2019-11-30 NOTE — ED Notes (Signed)
Provider Jarvis Morgan notified of critical trop and d-dimer.

## 2019-11-30 NOTE — Progress Notes (Addendum)
   11/30/19 0740  Clinical Encounter Type  Visited With Patient;Health care provider  Visit Type Initial  Referral From Other (Comment)  Consult/Referral To Chaplain  Chaplain responded to CODE STEMI page and stood outside the door silently praying. After about 15 minutes chaplain asked if any family was with pt and staff told her no. Chaplain told nurse Lelon Mast if chaplain is needed to please page them.

## 2019-11-30 NOTE — ED Notes (Signed)
RT at bedside. Pt taken off of BIPAP at this time. Pt placed on 2L [place]. Pt provided with meal tray and sitting up at this time

## 2019-12-01 ENCOUNTER — Encounter: Payer: Self-pay | Admitting: Internal Medicine

## 2019-12-01 ENCOUNTER — Other Ambulatory Visit: Payer: Self-pay

## 2019-12-01 DIAGNOSIS — I5023 Acute on chronic systolic (congestive) heart failure: Secondary | ICD-10-CM

## 2019-12-01 LAB — GLUCOSE, CAPILLARY
Glucose-Capillary: 163 mg/dL — ABNORMAL HIGH (ref 70–99)
Glucose-Capillary: 244 mg/dL — ABNORMAL HIGH (ref 70–99)
Glucose-Capillary: 251 mg/dL — ABNORMAL HIGH (ref 70–99)
Glucose-Capillary: 200 mg/dL — ABNORMAL HIGH (ref 70–99)

## 2019-12-01 LAB — CBC WITH DIFFERENTIAL/PLATELET
Abs Immature Granulocytes: 0.06 10*3/uL (ref 0.00–0.07)
Basophils Absolute: 0.1 10*3/uL (ref 0.0–0.1)
Basophils Relative: 1 %
Eosinophils Absolute: 0 10*3/uL (ref 0.0–0.5)
Eosinophils Relative: 0 %
HCT: 53.3 % — ABNORMAL HIGH (ref 39.0–52.0)
Hemoglobin: 16.6 g/dL (ref 13.0–17.0)
Immature Granulocytes: 1 %
Lymphocytes Relative: 13 %
Lymphs Abs: 1.6 10*3/uL (ref 0.7–4.0)
MCH: 20.6 pg — ABNORMAL LOW (ref 26.0–34.0)
MCHC: 31.1 g/dL (ref 30.0–36.0)
MCV: 66.3 fL — ABNORMAL LOW (ref 80.0–100.0)
Monocytes Absolute: 0.2 10*3/uL (ref 0.1–1.0)
Monocytes Relative: 2 %
Neutro Abs: 10.4 10*3/uL — ABNORMAL HIGH (ref 1.7–7.7)
Neutrophils Relative %: 83 %
Platelets: 698 10*3/uL — ABNORMAL HIGH (ref 150–400)
RBC: 8.04 MIL/uL — ABNORMAL HIGH (ref 4.22–5.81)
RDW: 27.4 % — ABNORMAL HIGH (ref 11.5–15.5)
WBC: 12.3 10*3/uL — ABNORMAL HIGH (ref 4.0–10.5)
nRBC: 0 % (ref 0.0–0.2)

## 2019-12-01 LAB — COMPREHENSIVE METABOLIC PANEL
ALT: 60 U/L — ABNORMAL HIGH (ref 0–44)
AST: 41 U/L (ref 15–41)
Albumin: 3.2 g/dL — ABNORMAL LOW (ref 3.5–5.0)
Alkaline Phosphatase: 92 U/L (ref 38–126)
Anion gap: 12 (ref 5–15)
BUN: 14 mg/dL (ref 8–23)
CO2: 20 mmol/L — ABNORMAL LOW (ref 22–32)
Calcium: 8.1 mg/dL — ABNORMAL LOW (ref 8.9–10.3)
Chloride: 108 mmol/L (ref 98–111)
Creatinine, Ser: 0.83 mg/dL (ref 0.61–1.24)
GFR calc Af Amer: >60 mL/min (ref >60)
GFR calc non Af Amer: >60 mL/min (ref >60)
Glucose, Bld: 154 mg/dL — ABNORMAL HIGH (ref 70–99)
Potassium: 3.4 mmol/L — ABNORMAL LOW (ref 3.5–5.1)
Sodium: 140 mmol/L (ref 135–145)
Total Bilirubin: 1.1 mg/dL (ref 0.3–1.2)
Total Protein: 6.8 g/dL (ref 6.5–8.1)

## 2019-12-01 LAB — HEMOGLOBIN A1C
Hgb A1c MFr Bld: 6.4 % — ABNORMAL HIGH (ref 4.8–5.6)
Mean Plasma Glucose: 136.98 mg/dL

## 2019-12-01 LAB — HEPATITIS B SURFACE ANTIGEN: Hepatitis B Surface Ag: NONREACTIVE

## 2019-12-01 LAB — LIPID PANEL
Cholesterol: 106 mg/dL (ref 0–200)
HDL: 42 mg/dL (ref >40)
LDL Cholesterol: 57 mg/dL (ref 0–99)
Total CHOL/HDL Ratio: 2.5 RATIO
Triglycerides: 34 mg/dL (ref <150)
VLDL: 7 mg/dL (ref 0–40)

## 2019-12-01 LAB — APTT: aPTT: 48 seconds — ABNORMAL HIGH (ref 24–36)

## 2019-12-01 LAB — FERRITIN: Ferritin: 21 ng/mL — ABNORMAL LOW (ref 24–336)

## 2019-12-01 LAB — FIBRIN DERIVATIVES D-DIMER (ARMC ONLY): Fibrin derivatives D-dimer (ARMC): 2097.69 ng/mL (FEU) — ABNORMAL HIGH (ref 0.00–499.00)

## 2019-12-01 LAB — C-REACTIVE PROTEIN
CRP: 1 mg/dL — ABNORMAL HIGH (ref <1.0)
CRP: 1.3 mg/dL — ABNORMAL HIGH (ref <1.0)

## 2019-12-01 LAB — MAGNESIUM: Magnesium: 1.6 mg/dL — ABNORMAL LOW (ref 1.7–2.4)

## 2019-12-01 LAB — TRIGLYCERIDES: Triglycerides: 34 mg/dL (ref <150)

## 2019-12-01 MED ORDER — APIXABAN 5 MG PO TABS
5.0000 mg | ORAL_TABLET | Freq: Two times a day (BID) | ORAL | Status: DC
  Administered 2019-12-01 – 2019-12-02 (×3): 5 mg via ORAL
  Filled 2019-12-01 (×3): qty 1

## 2019-12-01 MED ORDER — MAGNESIUM SULFATE 2 GM/50ML IV SOLN
2.0000 g | Freq: Once | INTRAVENOUS | Status: AC
  Administered 2019-12-01: 2 g via INTRAVENOUS
  Filled 2019-12-01: qty 50

## 2019-12-01 MED ORDER — POTASSIUM CHLORIDE CRYS ER 20 MEQ PO TBCR
40.0000 meq | EXTENDED_RELEASE_TABLET | Freq: Once | ORAL | Status: AC
  Administered 2019-12-01: 40 meq via ORAL
  Filled 2019-12-01: qty 2

## 2019-12-01 NOTE — ED Notes (Signed)
This RN in room to give pt his [employer] account planner. Pt states that he is willing to stay in the hospital after talking to MD.

## 2019-12-01 NOTE — Consult Note (Signed)
ANTICOAGULATION CONSULT NOTE  Pharmacy Consult for heparin Indication: chest pain/ACS  Patient Measurements: Height: 5\' 7"  (170.2 cm) Weight: 74.8 kg (165 lb) IBW/kg (Calculated) : 66.1 Heparin Dosing Weight: 74.8  Labs: Recent Labs    11/30/19 0800 11/30/19 0800 11/30/19 0955 11/30/19 1250 11/30/19 1500 11/30/19 1626 11/30/19 2301 12/01/19 0422  HGB 17.1*  --   --   --   --   --   --  16.6  HCT 56.9*  --   --   --   --   --   --  53.3*  PLT 638*  --   --   --   --   --   --  698*  APTT  --   --   --   --  37*  --  115* 48*  LABPROT 15.9*  --   --   --   --   --   --   --   INR 1.3*  --   --   --   --   --   --   --   HEPARINUNFRC  --   --   --   --  2.14*  --   --   --   CREATININE 1.02  --   --   --   --   --   --  0.83  TROPONINIHS 48*   < > 90* 168*  --  191*  --   --    < > = values in this interval not displayed.    Estimated Creatinine Clearance: 80.7 mL/min (by C-G formula based on SCr of 0.83 mg/dL).  Assessment: 69 year old male presenting to the ED with shortness of breath. PMH includes HTN, diabetes, GERD, gout, tobacco use, CAD s/p stent, aortic bioprosthetic valve, HF, and a fib. Trops 48>90>168. Pt therapeutic x 2 doses at 1050 units/hr during previous visit in June. Pharmacy consulted to dose heparin for ACS/STEMI.   Patient on apixaban prior to admission (last dose 11/29/19 PM). Given interference of apixaban with anti-Xa level as evidenced by baseline HL of 2.14, will follow aPTT for now.   Goal of Therapy:  aPTT 66-102 seconds Monitor platelets by anticoagulation protocol: Yes   Plan:  --9/25 at 2301 aPTT = 115, slightly supratherapeutic. Will decrease rate to 900 units/hr --Re-check aPTT in 6 hours --Daily CBC per protocol  9/26: aPTT @ 0422 = 48 Will increase drip rate to 950 units/hr. Will recheck aPTT 6 hrs after rate change.   Milany Geck D 12/01/2019 6:58 AM

## 2019-12-01 NOTE — ED Notes (Signed)
Pt expresses desire to be discharged today

## 2019-12-01 NOTE — Progress Notes (Signed)
SUBJECTIVE: Patient is very short of breath   Vitals:   12/01/19 0700 12/01/19 0800 12/01/19 0900 12/01/19 1000  BP: (!) 141/78 105/69 (!) 116/59 127/82  Pulse: 75 70 68 67  Resp: (!) 30 17 17  (!) 23  Temp:      SpO2: 99% 98% 100% 100%  Weight:      Height:        Intake/Output Summary (Last 24 hours) at 12/01/2019 1037 Last data filed at 12/01/2019 1007 Gross per 24 hour  Intake --  Output 4725 ml  Net -4725 ml    LABS: Basic Metabolic Panel: Recent Labs    11/30/19 0800 12/01/19 0422  NA 142 140  K 4.6 3.4*  CL 107 108  CO2 18* 20*  GLUCOSE 202* 154*  BUN 10 14  CREATININE 1.02 0.83  CALCIUM 9.0 8.1*  MG  --  1.6*   Liver Function Tests: Recent Labs    11/30/19 1626 12/01/19 0422  AST 67* 41  ALT 80* 60*  ALKPHOS 100 92  BILITOT 1.3* 1.1  PROT 6.8 6.8  ALBUMIN 3.3* 3.2*   No results for input(s): LIPASE, AMYLASE in the last 72 hours. CBC: Recent Labs    11/30/19 0800 12/01/19 0422  WBC 13.3* 12.3*  NEUTROABS  --  10.4*  HGB 17.1* 16.6  HCT 56.9* 53.3*  MCV 68.6* 66.3*  PLT 638* 698*   Cardiac Enzymes: No results for input(s): CKTOTAL, CKMB, CKMBINDEX, TROPONINI in the last 72 hours. BNP: Invalid input(s): POCBNP D-Dimer: No results for input(s): DDIMER in the last 72 hours. Hemoglobin A1C: Recent Labs    11/30/19 1626  HGBA1C 6.4*   Fasting Lipid Panel: Recent Labs    12/01/19 0422  CHOL 106  HDL 42  LDLCALC 57  TRIG 34  34  CHOLHDL 2.5   Thyroid Function Tests: No results for input(s): TSH, T4TOTAL, T3FREE, THYROIDAB in the last 72 hours.  Invalid input(s): FREET3 Anemia Panel: Recent Labs    12/01/19 0422  FERRITIN 21*     PHYSICAL EXAM General: Well developed, well nourished, in no acute distress HEENT:  Normocephalic and atramatic Neck:  No JVD.  Lungs: Clear bilaterally to auscultation and percussion. Heart: HRRR . Normal S1 and S2 without gallops or murmurs.  Abdomen: Bowel sounds are positive, abdomen soft  and non-tender  Msk:  Back normal, normal gait. Normal strength and tone for age. Extremities: No clubbing, cyanosis or edema.   Neuro: Alert and oriented X 3. Psych:  Good affect, responds appropriately  TELEMETRY: Heart rate 120 and atrial fibrillation  ASSESSMENT AND PLAN: Congestive heart failure with Covid respiratory failure and paroxysmal atrial fibrillation.  Patient had mildly elevated troponin and STEMI doctor on call was called initially and it was decided to treat the patient for heart failure as no evidence of any significant ST elevation myocardial infarction.  Principal Problem:   Acute on chronic systolic CHF (congestive heart failure) (HCC) Active Problems:   Diabetes mellitus without complication (HCC)   Gout   Hypertension   Tobacco abuse   CAD (coronary artery disease)   Elevated troponin   Leukocytosis   PAF (paroxysmal atrial fibrillation) (HCC)   Acute on chronic systolic (congestive) heart failure (HCC)   Pneumonia due to COVID-19 virus    12/03/19, MD, [facility] 12/01/2019 10:37 AM

## 2019-12-01 NOTE — Progress Notes (Signed)
PT Cancellation Note  Patient Details Name: Travis Maxwell MRN: [id number] DOB: 1950/10/05   Cancelled Treatment:    Reason Eval/Treat Not Completed: Patient not medically ready.  Chart reviewed.  Pt had up-trending troponin and started on heparin drip yesterday; per Cardiology note this morning, pt very SOB.  MD Welton Flakes (Cardiology), recommending holding PT at this time.  Will re-attempt PT evaluation at a later date/time as medically appropriate.  Hendricks Limes, PT 12/01/19, 12:54 PM

## 2019-12-01 NOTE — Progress Notes (Signed)
[facility]  - [place] [facility]   PATIENT NAME: Travis Maxwell    MR#:  [id number]  DATE OF BIRTH:  1950/08/08  SUBJECTIVE:  patient came in with sudden onset increasing shortness of breath and found to have congestive heart failure with positive COVID test. Had some low-grade fever and chills at home. Patient received Lasix in the emergency room. He has good urine output. He is requesting to go home.  Denies any chest pain. During my evaluation patient was on heparin drip.  REVIEW OF SYSTEMS:   Review of Systems  Constitutional: Negative for chills, fever and weight loss.  HENT: Negative for ear discharge, ear pain and nosebleeds.   Eyes: Negative for blurred vision, pain and discharge.  Respiratory: Positive for shortness of breath. Negative for sputum production, wheezing and stridor.   Cardiovascular: Negative for chest pain, palpitations, orthopnea and PND.  Gastrointestinal: Negative for abdominal pain, diarrhea, nausea and vomiting.  Genitourinary: Negative for frequency and urgency.  Musculoskeletal: Negative for back pain and joint pain.  Neurological: Positive for weakness. Negative for sensory change, speech change and focal weakness.  Psychiatric/Behavioral: Negative for depression and hallucinations. The patient is not nervous/anxious.    Tolerating Diet:yes Tolerating PT: ambulatory  DRUG ALLERGIES:   Allergies  Allergen Reactions  . Penicillins Other (See Comments)    Has patient had a PCN reaction causing immediate rash, facial/tongue/throat swelling, SOB or lightheadedness with hypotension: Unknown Has patient had a PCN reaction causing severe rash involving mucus membranes or skin necrosis: Unknown Has patient had a PCN reaction that required hospitalization: Unknown Has patient had a PCN reaction occurring within the last 10 years: No If all of the above answers are "NO", then may proceed with Cephalosporin use.     VITALS:  Blood  pressure (!) 117/97, pulse 61, temperature 98.6 F (37 C), resp. rate 18, height 5\' 7"  (1.702 m), weight 72.6 kg, SpO2 99 %.  PHYSICAL EXAMINATION:   Physical Exam  GENERAL:  69 y.o.-year-old patient lying in the bed with no acute distress. Chronically ill HEENT: Head atraumatic, normocephalic. Oropharynx and nasopharynx clear.  NECK:  Supple, no jugular venous distention. No thyroid enlargement, no tenderness.  LUNGS: Normal breath sounds bilaterally, no wheezing, few rales+ bibasilar, no rhonchi. No use of accessory muscles of respiration.  CARDIOVASCULAR: S1, S2 normal. No murmurs, rubs, or gallops.  ABDOMEN: Soft, nontender, nondistended. Bowel sounds present. No organomegaly or mass.  EXTREMITIES: No cyanosis, clubbing or edema b/l.    NEUROLOGIC: Cranial nerves II through XII are intact. No focal Motor or sensory deficits b/l.   PSYCHIATRIC:  patient is alert and oriented x 3.  SKIN: No obvious rash, lesion, or ulcer.   LABORATORY PANEL:  CBC Recent Labs  Lab 12/01/19 0422  WBC 12.3*  HGB 16.6  HCT 53.3*  PLT 698*    Chemistries  Recent Labs  Lab 12/01/19 0422  NA 140  K 3.4*  CL 108  CO2 20*  GLUCOSE 154*  BUN 14  CREATININE 0.83  CALCIUM 8.1*  MG 1.6*  AST 41  ALT 60*  ALKPHOS 92  BILITOT 1.1   Cardiac Enzymes No results for input(s): TROPONINI in the last 168 hours. RADIOLOGY:  DG Chest Portable 1 View  Result Date: 11/30/2019 CLINICAL DATA:  Shortness of breath.  Code STEMI. EXAM: PORTABLE CHEST 1 VIEW COMPARISON:  08/13/2019 FINDINGS: Enlarged cardiac silhouette. Postsurgical changes of aortic valve replacement with sternotomy wires. Interval development of diffuse interstitial prominence with  patchy airspace opacities. No visible pleural effusions. No pneumothorax. 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 No acute osseous abnormality. IMPRESSION: 1. Interval development of pulmonary vascular  congestion and diffuse interstitial prominence with patchy airspace opacities, compatible with pulmonary edema. Infection is not excluded. 2. Cardiomegaly. Electronically Signed   By: Feliberto Harts MD   On: 11/30/2019 08:19   ASSESSMENT AND PLAN:  Travis Maxwell is a 69 y.o. male with medical history significant of hypertension, diabetes mellitus, GERD, gout, tobacco abuse, CAD, stent placement, aortic valve replacement with bioprosthetic valve,sCHF with EF 35-40%, PAF on Eliquis, who presents with SOB. Patient states that his shortness breath started this morning suddenly, which has been progressively worsening.  Acute on chronic systolic CHF: Troponin 48--168--191 - 2D echo on 08/13/2019 showed EF <20%.  Patient has elevated BNP 2540, crackles on auscultation, pulmonary edema chest x-ray, clinically consistent with CHF exacerbation. -Elevated troponin appears demand ischemia. Patient is not have any chest pain -Lasix 40 mg bid by IV -pt is on Entresto -Bronchodilators -Daily weights -strict I/O's -Low salt diet -Fluid restriction -Obtain REDs Vest reading -Dr. Welton Flakes of cardiology is consulted-- okay to discontinue heparin drip. Will resume eliquis -patient has had issues with medical noncompliance and follow-up in the past  Elevated trop and hx of CAD: s/p of stent. Patient's elevated troponin is likely due to demand ischemia.  -Aspirin, Lipitor, metoprolol -As needed nitroglycerin -Check UDS-- positive for marijuana -no indication for repeating echo. Patient just had an echo in June 2021 -Dr. Welton Flakes of cardiology input noted   Pneumonia due to COVID-19 infection: CXR showed interval development of pulmonary vascular congestion and diffuse interstitial prominence with patchy airspace opacities.  - Oxygen saturation 92 to 99% on room air. -Remdesivir per pharm -Baricitinib -Solumedrol 40 mg bid -vitamin C, zinc.  -Bronchodilators -PRN Mucinex for cough -CRP 1.0--1.3 - D   Dimer [id number]  Diabetes mellitus without complication [facility]): Recent A1c 6.0, well controlled.  Patient is taking Januvia at home -SSI -A1c 6.4  Gout -Continue colchicine and allopurinol  Hypertension -IV hydralazine as needed -Entresto and metoprolol -Patient is IV on Lasix  Tobacco abuse -Nicotine patch  Leukocytosis: WBC 13.3.  No fever.  Possibly reactive  PAF -Eliquis -Metoprolol, amiodarone  DVT ppx: On Eliquis  Code Status: Full code Family Communication: not done, no family member is at bed side.   Disposition Plan:  Anticipate discharge back to previous environment Consults called: Cardiology, Dr. Welton Flakes Admission status:   progressive unit as inpt     Status is: Inpatient       TOTAL TIME TAKING CARE OF THIS PATIENT: *30* minutes.  >50% time spent on counselling and coordination of care  Note: This dictation was prepared with Dragon dictation along with smaller phrase technology. Any transcriptional errors that result from this process are unintentional.  Enedina Finner M.D    [facility]   CC: Primary care physician; [facility] HealthPatient ID: Travis Maxwell, male   DOB: 08-Jun-1950, 69 y.o.   MRN: [id number]

## 2019-12-01 NOTE — ED Notes (Signed)
Pt denies CP or any other pain. This RN captured EKG as noted small/temporary changes on monitor. EDP [place] assessed repeat EKG.

## 2019-12-01 NOTE — ED Notes (Signed)
Pt given meal tray.

## 2019-12-01 NOTE — ED Notes (Signed)
RN in room. Pt A & O at this time, in NAD. Pt is requesting to go home, states that he is feeling a lot better. Pt informed that attending MD is aware pt is wanting to go home and she will come talk to him when she is able.   Pt given coffee per request

## 2019-12-01 NOTE — ED Notes (Signed)
Dr. Allena Katz came and spoke with pt. Pt is still insisting that he would like to go home. Will have patient sign AMA if he decides to leave.

## 2019-12-01 NOTE — ED Notes (Signed)
Pt denies difficulty breathing, reports "I'm feeling really good! Hope I go home today"  Denies further needs at this time

## 2019-12-02 ENCOUNTER — Telehealth (HOSPITAL_COMMUNITY): Payer: Self-pay

## 2019-12-02 LAB — CBC WITH DIFFERENTIAL/PLATELET
Abs Immature Granulocytes: 0.26 10*3/uL — ABNORMAL HIGH (ref 0.00–0.07)
Basophils Absolute: 0.1 10*3/uL (ref 0.0–0.1)
Basophils Relative: 0 %
Eosinophils Absolute: 0 10*3/uL (ref 0.0–0.5)
Eosinophils Relative: 0 %
HCT: 57.6 % — ABNORMAL HIGH (ref 39.0–52.0)
Hemoglobin: 18.1 g/dL — ABNORMAL HIGH (ref 13.0–17.0)
Immature Granulocytes: 1 %
Lymphocytes Relative: 10 %
Lymphs Abs: 2 10*3/uL (ref 0.7–4.0)
MCH: 20.6 pg — ABNORMAL LOW (ref 26.0–34.0)
MCHC: 31.4 g/dL (ref 30.0–36.0)
MCV: 65.5 fL — ABNORMAL LOW (ref 80.0–100.0)
Monocytes Absolute: 0.8 10*3/uL (ref 0.1–1.0)
Monocytes Relative: 4 %
Neutro Abs: 17.9 10*3/uL — ABNORMAL HIGH (ref 1.7–7.7)
Neutrophils Relative %: 85 %
Platelets: 833 10*3/uL — ABNORMAL HIGH (ref 150–400)
RBC: 8.79 MIL/uL — ABNORMAL HIGH (ref 4.22–5.81)
RDW: 27.2 % — ABNORMAL HIGH (ref 11.5–15.5)
Smear Review: NORMAL
WBC: 21.1 10*3/uL — ABNORMAL HIGH (ref 4.0–10.5)
nRBC: 0 % (ref 0.0–0.2)

## 2019-12-02 LAB — COMPREHENSIVE METABOLIC PANEL
ALT: 46 U/L — ABNORMAL HIGH (ref 0–44)
AST: 25 U/L (ref 15–41)
Albumin: 3.5 g/dL (ref 3.5–5.0)
Alkaline Phosphatase: 97 U/L (ref 38–126)
Anion gap: 11 (ref 5–15)
BUN: 23 mg/dL (ref 8–23)
CO2: 21 mmol/L — ABNORMAL LOW (ref 22–32)
Calcium: 9 mg/dL (ref 8.9–10.3)
Chloride: 103 mmol/L (ref 98–111)
Creatinine, Ser: 0.78 mg/dL (ref 0.61–1.24)
GFR calc Af Amer: >60 mL/min (ref >60)
GFR calc non Af Amer: >60 mL/min (ref >60)
Glucose, Bld: 175 mg/dL — ABNORMAL HIGH (ref 70–99)
Potassium: 4.7 mmol/L (ref 3.5–5.1)
Sodium: 135 mmol/L (ref 135–145)
Total Bilirubin: 1.3 mg/dL — ABNORMAL HIGH (ref 0.3–1.2)
Total Protein: 7.5 g/dL (ref 6.5–8.1)

## 2019-12-02 LAB — C-REACTIVE PROTEIN: CRP: 0.5 mg/dL (ref <1.0)

## 2019-12-02 LAB — FIBRIN DERIVATIVES D-DIMER (ARMC ONLY): Fibrin derivatives D-dimer (ARMC): 742.25 ng/mL (FEU) — ABNORMAL HIGH (ref 0.00–499.00)

## 2019-12-02 LAB — FERRITIN: Ferritin: 27 ng/mL (ref 24–336)

## 2019-12-02 LAB — GLUCOSE, CAPILLARY: Glucose-Capillary: 188 mg/dL — ABNORMAL HIGH (ref 70–99)

## 2019-12-02 MED ORDER — ALBUTEROL SULFATE HFA 108 (90 BASE) MCG/ACT IN AERS: 2.0000 | INHALATION_SPRAY | RESPIRATORY_TRACT | 0 refills | Status: AC | PRN

## 2019-12-02 MED ORDER — ALUM & MAG HYDROXIDE-SIMETH 200-200-20 MG/5ML PO SUSP
15.0000 mL | Freq: Four times a day (QID) | ORAL | Status: DC | PRN
  Administered 2019-12-02: 15 mL via ORAL
  Filled 2019-12-02: qty 30

## 2019-12-02 NOTE — Progress Notes (Signed)
SUBJECTIVE: Feeling much better today no shortness of breath   Vitals:   12/01/19 2115 12/02/19 0413 12/02/19 0418 12/02/19 0918  BP: 120/78 111/87  120/81  Pulse: 63 62  63  Resp: 17 18  19   Temp: 98.4 F (36.9 C) 98.7 F (37.1 C)  98.7 F (37.1 C)  TempSrc:  Oral  Oral  SpO2: 97% 95%  97%  Weight:   69.6 kg   Height:        Intake/Output Summary (Last 24 hours) at 12/02/2019 1030 Last data filed at 12/02/2019 0400 Gross per 24 hour  Intake --  Output 1950 ml  Net -1950 ml    LABS: Basic Metabolic Panel: Recent Labs    12/01/19 0422 12/02/19 0557  NA 140 135  K 3.4* 4.7  CL 108 103  CO2 20* 21*  GLUCOSE 154* 175*  BUN 14 23  CREATININE 0.83 0.78  CALCIUM 8.1* 9.0  MG 1.6*  --    Liver Function Tests: Recent Labs    12/01/19 0422 12/02/19 0557  AST 41 25  ALT 60* 46*  ALKPHOS 92 97  BILITOT 1.1 1.3*  PROT 6.8 7.5  ALBUMIN 3.2* 3.5   No results for input(s): LIPASE, AMYLASE in the last 72 hours. CBC: Recent Labs    12/01/19 0422 12/02/19 0557  WBC 12.3* 21.1*  NEUTROABS 10.4* 17.9*  HGB 16.6 18.1*  HCT 53.3* 57.6*  MCV 66.3* 65.5*  PLT 698* 833*   Cardiac Enzymes: No results for input(s): CKTOTAL, CKMB, CKMBINDEX, TROPONINI in the last 72 hours. BNP: Invalid input(s): POCBNP D-Dimer: No results for input(s): DDIMER in the last 72 hours. Hemoglobin A1C: Recent Labs    11/30/19 1626  HGBA1C 6.4*   Fasting Lipid Panel: Recent Labs    12/01/19 0422  CHOL 106  HDL 42  LDLCALC 57  TRIG 34  34  CHOLHDL 2.5   Thyroid Function Tests: No results for input(s): TSH, T4TOTAL, T3FREE, THYROIDAB in the last 72 hours.  Invalid input(s): FREET3 Anemia Panel: Recent Labs    12/02/19 0557  FERRITIN 27     PHYSICAL EXAM General: Well developed, well nourished, in no acute distress HEENT:  Normocephalic and atramatic Neck:  No JVD.  Lungs: Clear bilaterally to auscultation and percussion. Heart: HRRR . Normal S1 and S2 without gallops  or murmurs.  Abdomen: Bowel sounds are positive, abdomen soft and non-tender  Msk:  Back normal, normal gait. Normal strength and tone for age. Extremities: No clubbing, cyanosis or edema.   Neuro: Alert and oriented X 3. Psych:  Good affect, responds appropriately  TELEMETRY: Back in sinus rhythm  ASSESSMENT AND PLAN: Congestive heart failure due to HFrEF with paroxysmal atrial fibrillation currently in sinus rhythm and elevated troponin due to demand ischemia.  Patient had Covid infection which may have exacerbated his symptoms.  Heparin was discontinued yesterday.  Patient was placed back on Eliquis.  Feeling much better this morning.  Principal Problem:   Acute on chronic systolic CHF (congestive heart failure) (HCC) Active Problems:   Diabetes mellitus without complication (HCC)   Gout   Hypertension   Tobacco abuse   CAD (coronary artery disease)   Elevated troponin   Leukocytosis   PAF (paroxysmal atrial fibrillation) (HCC)   Acute on chronic systolic (congestive) heart failure (HCC)   Pneumonia due to COVID-19 virus    12/04/19, MD, [facility] 12/02/2019 10:30 AM

## 2019-12-02 NOTE — Discharge Instructions (Signed)
Pt advised to f/u cardiology and PC on your scheduled appt Stop smoking

## 2019-12-02 NOTE — Plan of Care (Signed)

## 2019-12-02 NOTE — Telephone Encounter (Signed)
Attempted to contact patient, patient's family to notify them of patient's Remdesivir appointments and transportation set up. Unable to leave voicemail on any phone number in chart or connect with family. Will try again later.  Staton Markey Loyola Mast, RN

## 2019-12-02 NOTE — Progress Notes (Signed)
Patient being discharged. IV's take out and tele monitor off. Patient given discharge instructions and he verbalized understanding without any questions or concerns. Advised the importance of making the follow up appointments as we could not do so bc offices were closed. Patient agreed to do so. Patient also notified that he will be called to get information of transportation and dates for his outpatient Remdesivir infusions.

## 2019-12-02 NOTE — Evaluation (Signed)
Physical Therapy Evaluation Patient Details Name: Travis Maxwell MRN: [id number] DOB: [birth year] Today's Date: 12/02/2019   History of Present Illness  Pt is a 69 y.o. male presenting to hospital 9/25 with acute onset SOB; required BiPAP in ED.  (+) COVID-19.  Pt admitted with acute on chronic systolic CHF, elevated troponin (likely d/t demand ischemia), and PNA d/t COVID-19 infection.  PMH includes CAD, AVR, CHF, DM, gout, htn.  Clinical Impression  Prior to hospital admission, pt was independent; lives at motel with his wife.  Currently pt is independent with bed mobility, transfers, and ambulation 200 feet (no AD) in room.  Pt steady and safe with all functional mobility.  Pt's HR 60-70 bpm and O2 sats 97% or greater on room air during sessions activities.  No acute PT needs identified; will sign off.  Please re-consult PT if pt's status changes and acute PT needs are identified.    Follow Up Recommendations No PT follow up    Equipment Recommendations  None recommended by PT    Recommendations for Other Services       Precautions / Restrictions Precautions Precautions: None Restrictions Weight Bearing Restrictions: No      Mobility  Bed Mobility Overal bed mobility: Independent             General bed mobility comments: Supine to/from sitting without any noted difficulty  Transfers Overall transfer level: Independent Equipment used: None             General transfer comment: steady safe transfers noted  Ambulation/Gait Ambulation/Gait assistance: Independent Gait Distance (Feet): 200 Feet (in room) Assistive device: None Gait Pattern/deviations: WFL(Within Functional Limits)     General Gait Details: steady ambulation  Stairs            Wheelchair Mobility    Modified Rankin (Stroke Patients Only)       Balance Overall balance assessment: No apparent balance deficits (not formally assessed)                                            Pertinent Vitals/Pain Pain Assessment: No/denies pain    Home Living Family/patient expects to be discharged to:: Other (Comment) (motel) Living Arrangements: Spouse/significant other Available Help at Discharge: Family Type of Home:  (motel) Home Access: Level entry     Home Layout: One level Home Equipment: Grab bars - tub/shower;Grab bars - toilet      Prior Function Level of Independence: Independent               Hand Dominance        Extremity/Trunk Assessment   Upper Extremity Assessment Upper Extremity Assessment: Overall WFL for tasks assessed    Lower Extremity Assessment Lower Extremity Assessment: Overall WFL for tasks assessed    Cervical / Trunk Assessment Cervical / Trunk Assessment: Normal  Communication   Communication: No difficulties  Cognition Arousal/Alertness: Awake/alert Behavior During Therapy: WFL for tasks assessed/performed Overall Cognitive Status: Within Functional Limits for tasks assessed                                        General Comments   Nursing cleared pt for participation in physical therapy.  Pt agreeable to PT session.    Exercises     Assessment/Plan  PT Assessment Patent does not need any further PT services  PT Problem List         PT Treatment Interventions      PT Goals (Current goals can be found in the Care Plan section)  Acute Rehab PT Goals Patient Stated Goal: to go home today PT Goal Formulation: With patient Time For Goal Achievement: 12/16/19 Potential to Achieve Goals: Good    Frequency     Barriers to discharge        Co-evaluation               AM-PAC PT "6 Clicks" Mobility  Outcome Measure Help needed turning from your back to your side while in a flat bed without using bedrails?: None Help needed moving from lying on your back to sitting on the side of a flat bed without using bedrails?: None Help needed moving to and from a bed to a  chair (including a wheelchair)?: None Help needed standing up from a chair using your arms (e.g., wheelchair or bedside chair)?: None Help needed to walk in hospital room?: None Help needed climbing 3-5 steps with a railing? : None 6 Click Score: 24    End of Session   Activity Tolerance: Patient tolerated treatment well Patient left: in bed;with call bell/phone within reach Nurse Communication: Mobility status;Precautions;Other (comment) (pt requesting medication for "gas" feeling) PT Visit Diagnosis: Muscle weakness (generalized) (M62.81)    Time: 8657-8469 PT Time Calculation (min) (ACUTE ONLY): 12 min   Charges:   PT Evaluation $PT Eval Low Complexity: 1 Low         Travis Maxwell, PT 12/02/19, 1:27 PM

## 2019-12-02 NOTE — Discharge Summary (Signed)
[facility] [place] at [facility]   PATIENT NAME: Travis Maxwell    MR#:  [id number]  DATE OF BIRTH:  08-Oct-1950  DATE OF ADMISSION:  11/30/2019 ADMITTING PHYSICIAN: Lorretta Harp, MD  DATE OF DISCHARGE: 12/02/2019  PRIMARY CARE PHYSICIAN: [facility], [facility]    ADMISSION DIAGNOSIS:  Acute pulmonary edema (HCC) [J81.0] SOB (shortness of breath) [R06.02] Acute respiratory failure with hypoxia (HCC) [J96.01] Acute on chronic systolic CHF (congestive heart failure) (HCC) [I50.23] Acute on chronic systolic (congestive) heart failure (HCC) [I50.23] Acute on chronic congestive heart failure, unspecified heart failure type (HCC) [I50.9]  DISCHARGE DIAGNOSIS:  Acute on Chronic systolic CHF with Pulmonary edema COVID -19 pneumonia  SECONDARY DIAGNOSIS:   Past Medical History:  Diagnosis Date  . CAD (coronary artery disease)   . Chronic systolic (congestive) heart failure (HCC)   . Diabetes mellitus without complication (HCC)   . GERD (gastroesophageal reflux disease)   . Gout   . Hx of aortic valve replacement    Bioprosthetic valve  . Hypertension     HOSPITAL COURSE:  Travis Maxwell a 69 y.o.malewith medical history significant ofhypertension, diabetes mellitus, GERD, gout, tobacco abuse, CAD, stent placement, aortic valve replacement with bioprosthetic valve,sCHF with EF 35-40%,PAF on Eliquis,who presents with SOB. Patient states that his shortness breath started this morning suddenly, which has been progressively worsening.  Acute on chronic systolic CHF/pulmonary edema:Troponin 48--168--191 -2D echo on 08/13/2019 showed EF <20%. Patient has elevated BNP 2540, crackles on auscultation, pulmonary edema chest x-ray, clinically consistent with CHF exacerbation. -Elevated troponin appears demand ischemia. Patient is not have any chest pain -Lasix 40 mg bid by IV--UOP 7.5 liters -On Ra--feels back to baseline and wants to go home (since yday  pt has been requesting) -pt is on Entresto, BB -Bronchodilators -Low salt diet -Fluid restriction -Dr.Khanof cardiology is consulted-- okay to discontinue heparin drip. Will resume eliquis -patient has had issues with medical noncompliance and follow-up in the past -pt advised to f/u with Dr Gwen Pounds -defer spironolactone to cardiology to consider  Elevated trop and hx of CAD:s/p of stent. Patient's elevated troponin is likely due to demand ischemia.  -Aspirin, Lipitor,metoprolol -As needed nitroglycerin -Check UDS-- positive for marijuana -no indication for repeating echo. Patient just had an echo in June 2021 -Dr.Khanof cardiology input noted  H/o Afib  -pt on Amiodarone 400 mg tid (per Dr Gwen Pounds clinic aug 11th note says cont this dose) -on metoprolol and po eliquis  Pneumonia due to COVID-19 infection:CXR showed interval development of pulmonary vascular congestion and diffuse interstitial prominence with patchy airspace opacities. -Oxygen saturation 92 to 99% on room air. -Remdesivir per pharm--will schedule outpt infuion for 3 days at [facility]--pt aware -Baricitinib -Solumedrol40 mg bid--not hypoxic any more--d/c it -vitamin C, zinc.  -Bronchodilators -PRN Mucinex for cough -CRP 1.0--1.3 - D  Dimer 2828--2097  Diabetes mellitus without complication (HCC):Recent A1c 6.0, well controlled.Patient is taking Januvia at home -SSI -A1c 6.4  Gout -Continue colchicine and allopurinol  Hypertension -IV hydralazine as needed -Entresto andmetoprolol  Tobacco abuse -Nicotine patch  Leukocytosis:WBC 13.3. No fever. Possibly reactive due to steroids received in the ER  DVT ppx:On Eliquis Code Status:Full code Family Communication: not done, no family member is at bed side. Disposition Plan: Anticipate discharge back to previous environment Consults called:Cardiology, Dr. Welton Flakes Admission status: progressive unit as inpt   pt is very  anxious since yday to leave the hospital. He is at a very HIGH RISK for readmission.  CONSULTS OBTAINED:  DRUG ALLERGIES:   Allergies  Allergen Reactions  . Penicillins Other (See Comments)    Has patient had a PCN reaction causing immediate rash, facial/tongue/throat swelling, SOB or lightheadedness with hypotension: Unknown Has patient had a PCN reaction causing severe rash involving mucus membranes or skin necrosis: Unknown Has patient had a PCN reaction that required hospitalization: Unknown Has patient had a PCN reaction occurring within the last 10 years: No If all of the above answers are "NO", then may proceed with Cephalosporin use.     DISCHARGE MEDICATIONS:   Allergies as of 12/02/2019      Reactions   Penicillins Other (See Comments)   Has patient had a PCN reaction causing immediate rash, facial/tongue/throat swelling, SOB or lightheadedness with hypotension: Unknown Has patient had a PCN reaction causing severe rash involving mucus membranes or skin necrosis: Unknown Has patient had a PCN reaction that required hospitalization: Unknown Has patient had a PCN reaction occurring within the last 10 years: No If all of the above answers are "NO", then may proceed with Cephalosporin use.      Medication List    STOP taking these medications   cyclobenzaprine 10 MG tablet Commonly known as: FLEXERIL     TAKE these medications   albuterol 108 (90 Base) MCG/ACT inhaler Commonly known as: VENTOLIN HFA Inhale 2 puffs into the lungs every 4 (four) hours as needed for wheezing or shortness of breath.   allopurinol 300 MG tablet Commonly known as: ZYLOPRIM Take 300 mg by mouth daily.   amiodarone 200 MG tablet Commonly known as: PACERONE Take 400 mg by mouth in the morning, at noon, and at bedtime.   apixaban 5 MG Tabs tablet Commonly known as: ELIQUIS Take 1 tablet (5 mg total) by mouth 2 (two) times daily.   aspirin 81 MG EC tablet Take 1 tablet (81 mg total)  by mouth daily.   atorvastatin 40 MG tablet Commonly known as: LIPITOR Take 40 mg by mouth daily.   colchicine 0.6 MG tablet Take 2 tabs PO x 1, then 1 tab PO 1 hour later x 1 Max: 1.8 mg total dose per attack, do not repeat within 3 days.   famotidine 20 MG tablet Commonly known as: PEPCID Take 20 mg by mouth daily.   furosemide 20 MG tablet Commonly known as: LASIX Take 20 mg by mouth.   metoprolol succinate 25 MG 24 hr tablet Commonly known as: TOPROL-XL Take 25 mg by mouth daily.   nitroGLYCERIN 0.4 MG SL tablet Commonly known as: NITROSTAT Place 1 tablet (0.4 mg total) under the tongue every 5 (five) minutes as needed for chest pain.   potassium chloride 10 MEQ tablet Commonly known as: KLOR-CON Take 10 mEq by mouth daily.   sacubitril-valsartan 24-26 MG Commonly known as: ENTRESTO Take 1 tablet by mouth 2 (two) times daily.   sitaGLIPtin 100 MG tablet Commonly known as: JANUVIA Take 100 mg by mouth daily.       If you experience worsening of your admission symptoms, develop shortness of breath, life threatening emergency, suicidal or homicidal thoughts you must seek medical attention immediately by calling 911 or calling your MD immediately  if symptoms less severe.  You Must read complete instructions/literature along with all the possible adverse reactions/side effects for all the Medicines you take and that have been prescribed to you. Take any new Medicines after you have completely understood and accept all the possible adverse reactions/side effects.   Please note  You were cared  for by a hospitalist during your hospital stay. If you have any questions about your discharge medications or the care you received while you were in the hospital after you are discharged, you can call the unit and asked to speak with the hospitalist on call if the hospitalist that took care of you is not available. Once you are discharged, your primary care physician will handle any  further medical issues. Please note that NO REFILLS for any discharge medications will be authorized once you are discharged, as it is imperative that you return to your primary care physician (or establish a relationship with a primary care physician if you do not have one) for your aftercare needs so that they can reassess your need for medications and monitor your lab values. Today   SUBJECTIVE   I am ready to go home  VITAL SIGNS:  Blood pressure 120/81, pulse 63, temperature 98.7 F (37.1 C), temperature source Oral, resp. rate 19, height 5\' 7"  (1.702 m), weight 69.6 kg, SpO2 97 %.  I/O:    Intake/Output Summary (Last 24 hours) at 12/02/2019 1158 Last data filed at 12/02/2019 1053 Gross per 24 hour  Intake --  Output 1850 ml  Net -1850 ml    PHYSICAL EXAMINATION:  GENERAL:  69 y.o.-year-old patient lying in the bed with no acute distress. Thin EYES: Pupils equal, round, reactive to light and accommodation. No scleral icterus.  HEENT: Head atraumatic, normocephalic. Oropharynx and nasopharynx clear.  NECK:  Supple, no jugular venous distention. No thyroid enlargement, no tenderness.  LUNGS: Normal breath sounds bilaterally, no wheezing, rales,rhonchi or crepitation. No use of accessory muscles of respiration.  CARDIOVASCULAR: S1, S2 normal. No murmurs, rubs, or gallops.  ABDOMEN: Soft, non-tender, non-distended. Bowel sounds present. No organomegaly or mass.  EXTREMITIES: No pedal edema, cyanosis, or clubbing.  NEUROLOGIC: Cranial nerves II through XII are intact. Muscle strength 5/5 in all extremities. Sensation intact. Gait not checked.  PSYCHIATRIC: The patient is alert and oriented x 3.  SKIN: No obvious rash, lesion, or ulcer.   DATA REVIEW:   CBC  Recent Labs  Lab 12/02/19 0557  WBC 21.1*  HGB 18.1*  HCT 57.6*  PLT 833*    Chemistries  Recent Labs  Lab 12/01/19 0422 12/01/19 0422 12/02/19 0557  NA 140   < > 135  K 3.4*   < > 4.7  CL 108   < > 103  CO2  20*   < > 21*  GLUCOSE 154*   < > 175*  BUN 14   < > 23  CREATININE 0.83   < > 0.78  CALCIUM 8.1*   < > 9.0  MG 1.6*  --   --   AST 41   < > 25  ALT 60*   < > 46*  ALKPHOS 92   < > 97  BILITOT 1.1   < > 1.3*   < > = values in this interval not displayed.    Microbiology Results   Recent Results (from the past 240 hour(s))  Respiratory Panel by RT PCR (Flu A&B, Covid) - Nasopharyngeal Swab     Status: Abnormal   Collection Time: 11/30/19  8:00 AM   Specimen: Nasopharyngeal Swab  Result Value Ref Range Status   SARS Coronavirus 2 by RT PCR POSITIVE (A) NEGATIVE Final    Comment: RESULT CALLED TO, READ BACK BY AND VERIFIED WITH:  BRANDY DAVIS AT 1056 11/30/19 SDR (NOTE) SARS-CoV-2 target nucleic acids are DETECTED.  SARS-CoV-2  RNA is generally detectable in upper respiratory specimens  during the acute phase of infection. Positive results are indicative of the presence of the identified virus, but do not rule out bacterial infection or co-infection with other pathogens not detected by the test. Clinical correlation with patient history and other diagnostic information is necessary to determine patient infection status. The expected result is Negative.  Fact Sheet for Patients:  https://www.moore.com/  Fact Sheet for Healthcare Providers: https://www.young.biz/  This test is not yet approved or cleared by the [place] FDA and  has been authorized for detection and/or diagnosis of SARS-CoV-2 by FDA under an Emergency Use Authorization (EUA).  This EUA will remain in effect (meaning this test can be u sed) for the duration of  the COVID-19 declaration under Section 564(b)(1) of the Act, 21 U.S.C. section 360bbb-3(b)(1), unless the authorization is terminated or revoked sooner.      Influenza A by PCR NEGATIVE NEGATIVE Final   Influenza B by PCR NEGATIVE NEGATIVE Final    Comment: (NOTE) The Xpert Xpress SARS-CoV-2/FLU/RSV assay  is intended as an aid in  the diagnosis of influenza from Nasopharyngeal swab specimens and  should not be used as a sole basis for treatment. Nasal washings and  aspirates are unacceptable for Xpert Xpress SARS-CoV-2/FLU/RSV  testing.  Fact Sheet for Patients: https://www.moore.com/  Fact Sheet for Healthcare Providers: https://www.young.biz/  This test is not yet approved or cleared by the [place] FDA and  has been authorized for detection and/or diagnosis of SARS-CoV-2 by  FDA under an Emergency Use Authorization (EUA). This EUA will remain  in effect (meaning this test can be used) for the duration of the  Covid-19 declaration under Section 564(b)(1) of the Act, 21  U.S.C. section 360bbb-3(b)(1), unless the authorization is  terminated or revoked. Performed at [facility], [address] [place], [place] [address]   CULTURE, BLOOD (ROUTINE X 2) w Reflex to ID Panel     Status: None (Preliminary result)   Collection Time: 11/30/19 12:40 PM   Specimen: BLOOD  Result Value Ref Range Status   Specimen Description BLOOD LEFT [facility]  Final   Special Requests   Final    BOTTLES DRAWN AEROBIC AND ANAEROBIC Blood Culture adequate volume   Culture   Final    NO GROWTH 2 DAYS Performed at [facility], [address], [place], [place] [address]    Report Status PENDING  Incomplete  CULTURE, BLOOD (ROUTINE X 2) w Reflex to ID Panel     Status: None (Preliminary result)   Collection Time: 11/30/19 12:40 PM   Specimen: BLOOD  Result Value Ref Range Status   Specimen Description BLOOD LEFT ARM  Final   Special Requests   Final    BOTTLES DRAWN AEROBIC AND ANAEROBIC Blood Culture adequate volume   Culture   Final    NO GROWTH 2 DAYS Performed at [facility], [address] [place], [place] [address]    Report Status PENDING  Incomplete    RADIOLOGY:  No results found.   CODE STATUS:     Code Status  Orders  (From admission, onward)         Start     Ordered   11/30/19 1513  Full code  Continuous        11/30/19 1512        Code Status History    Date Active Date Inactive Code Status Order ID Comments User Context   08/13/2019  1336 08/14/2019 2053 Full Code [id number]  Lorretta Harp, MD Inpatient   07/25/2017 0127 07/26/2017 1919 Full Code [id number]  Altamese Dilling, MD Inpatient   Advance Care Planning Activity       TOTAL TIME TAKING CARE OF THIS PATIENT: *35* minutes.    Enedina Finner M.D  [facility]    CC: Primary care physician; [facility], [facility]

## 2019-12-02 NOTE — Telephone Encounter (Signed)
Patient transport unable to be scheduled for tomorrow due to outside vendor unavailability. Appointments scheduled for the next 3 days; will follow up with transportation tomorrow am (9/28) Attempted to call family/patient to inform them however unable to connect with them or leave VM.   Zanasia Hickson Loyola Mast, RN

## 2019-12-02 NOTE — Progress Notes (Signed)
Patient scheduled for outpatient Remdesivir infusions at 9am on Tuesday (9/28), Wednesday (9/29) and Thursday (9/30) at [facility] Please inform the patient to park at [address] [place], [place], as staff will be escorting the patient through the east entrance of the hospital. Appointments take approximately 45 minutes.    There is a wave flag banner located near the entrance on [address]. Turn into this entrance and immediately turn left and park in 1 of the 5 designated Covid Infusion Parking spots. There is a phone number on the sign, please call and let the staff know what spot you are in and we will come out and get you. For questions call [phone].  Thanks.

## 2019-12-03 ENCOUNTER — Inpatient Hospital Stay (HOSPITAL_COMMUNITY): Admission: RE | Admit: 2019-12-03 | Payer: Medicare HMO | Source: Ambulatory Visit

## 2019-12-04 ENCOUNTER — Ambulatory Visit (HOSPITAL_COMMUNITY): Payer: Medicare HMO

## 2019-12-05 ENCOUNTER — Ambulatory Visit (HOSPITAL_COMMUNITY): Payer: No Typology Code available for payment source | Attending: Pulmonary Disease

## 2019-12-05 LAB — CULTURE, BLOOD (ROUTINE X 2)
Culture: NO GROWTH
Culture: NO GROWTH
Special Requests: ADEQUATE
Special Requests: ADEQUATE

## 2020-01-06 ENCOUNTER — Emergency Department: Payer: Medicare HMO

## 2020-01-06 ENCOUNTER — Other Ambulatory Visit: Payer: Self-pay

## 2020-01-06 ENCOUNTER — Inpatient Hospital Stay
Admission: EM | Admit: 2020-01-06 | Discharge: 2020-02-20 | DRG: 207 | Disposition: A | Discharge status: Skilled Nursing Facility | Payer: Medicare HMO | Attending: Internal Medicine | Admitting: Internal Medicine

## 2020-01-06 ENCOUNTER — Encounter: Payer: Self-pay | Admitting: Intensive Care

## 2020-01-06 ENCOUNTER — Inpatient Hospital Stay: Payer: Medicare HMO

## 2020-01-06 DIAGNOSIS — E874 Mixed disorder of acid-base balance: Secondary | ICD-10-CM | POA: Diagnosis present

## 2020-01-06 DIAGNOSIS — D75839 Thrombocytosis, unspecified: Secondary | ICD-10-CM | POA: Diagnosis present

## 2020-01-06 DIAGNOSIS — Z955 Presence of coronary angioplasty implant and graft: Secondary | ICD-10-CM

## 2020-01-06 DIAGNOSIS — R627 Adult failure to thrive: Secondary | ICD-10-CM | POA: Diagnosis not present

## 2020-01-06 DIAGNOSIS — I5021 Acute systolic (congestive) heart failure: Secondary | ICD-10-CM

## 2020-01-06 DIAGNOSIS — Z7189 Other specified counseling: Secondary | ICD-10-CM | POA: Diagnosis not present

## 2020-01-06 DIAGNOSIS — Z79899 Other long term (current) drug therapy: Secondary | ICD-10-CM

## 2020-01-06 DIAGNOSIS — G9341 Metabolic encephalopathy: Secondary | ICD-10-CM | POA: Diagnosis not present

## 2020-01-06 DIAGNOSIS — J9600 Acute respiratory failure, unspecified whether with hypoxia or hypercapnia: Secondary | ICD-10-CM

## 2020-01-06 DIAGNOSIS — I480 Paroxysmal atrial fibrillation: Secondary | ICD-10-CM | POA: Diagnosis not present

## 2020-01-06 DIAGNOSIS — J9602 Acute respiratory failure with hypercapnia: Secondary | ICD-10-CM | POA: Diagnosis present

## 2020-01-06 DIAGNOSIS — Z7982 Long term (current) use of aspirin: Secondary | ICD-10-CM

## 2020-01-06 DIAGNOSIS — Z978 Presence of other specified devices: Secondary | ICD-10-CM

## 2020-01-06 DIAGNOSIS — I255 Ischemic cardiomyopathy: Secondary | ICD-10-CM | POA: Diagnosis present

## 2020-01-06 DIAGNOSIS — F1010 Alcohol abuse, uncomplicated: Secondary | ICD-10-CM | POA: Diagnosis present

## 2020-01-06 DIAGNOSIS — I340 Nonrheumatic mitral (valve) insufficiency: Secondary | ICD-10-CM | POA: Diagnosis not present

## 2020-01-06 DIAGNOSIS — I4891 Unspecified atrial fibrillation: Secondary | ICD-10-CM | POA: Diagnosis not present

## 2020-01-06 DIAGNOSIS — J15212 Pneumonia due to Methicillin resistant Staphylococcus aureus: Secondary | ICD-10-CM | POA: Diagnosis present

## 2020-01-06 DIAGNOSIS — Z720 Tobacco use: Secondary | ICD-10-CM | POA: Diagnosis not present

## 2020-01-06 DIAGNOSIS — E785 Hyperlipidemia, unspecified: Secondary | ICD-10-CM | POA: Diagnosis present

## 2020-01-06 DIAGNOSIS — G931 Anoxic brain damage, not elsewhere classified: Secondary | ICD-10-CM | POA: Diagnosis present

## 2020-01-06 DIAGNOSIS — Z452 Encounter for adjustment and management of vascular access device: Secondary | ICD-10-CM

## 2020-01-06 DIAGNOSIS — F0391 Unspecified dementia with behavioral disturbance: Secondary | ICD-10-CM | POA: Diagnosis present

## 2020-01-06 DIAGNOSIS — I471 Supraventricular tachycardia: Secondary | ICD-10-CM | POA: Diagnosis present

## 2020-01-06 DIAGNOSIS — I4581 Long QT syndrome: Secondary | ICD-10-CM | POA: Diagnosis not present

## 2020-01-06 DIAGNOSIS — J9601 Acute respiratory failure with hypoxia: Secondary | ICD-10-CM | POA: Diagnosis present

## 2020-01-06 DIAGNOSIS — E440 Moderate protein-calorie malnutrition: Secondary | ICD-10-CM | POA: Diagnosis present

## 2020-01-06 DIAGNOSIS — I2510 Atherosclerotic heart disease of native coronary artery without angina pectoris: Secondary | ICD-10-CM | POA: Diagnosis not present

## 2020-01-06 DIAGNOSIS — Z8616 Personal history of COVID-19: Secondary | ICD-10-CM

## 2020-01-06 DIAGNOSIS — N179 Acute kidney failure, unspecified: Secondary | ICD-10-CM | POA: Diagnosis not present

## 2020-01-06 DIAGNOSIS — I472 Ventricular tachycardia, unspecified: Secondary | ICD-10-CM

## 2020-01-06 DIAGNOSIS — I25110 Atherosclerotic heart disease of native coronary artery with unstable angina pectoris: Secondary | ICD-10-CM | POA: Diagnosis present

## 2020-01-06 DIAGNOSIS — J151 Pneumonia due to Pseudomonas: Secondary | ICD-10-CM | POA: Diagnosis not present

## 2020-01-06 DIAGNOSIS — F17210 Nicotine dependence, cigarettes, uncomplicated: Secondary | ICD-10-CM | POA: Diagnosis present

## 2020-01-06 DIAGNOSIS — I5023 Acute on chronic systolic (congestive) heart failure: Secondary | ICD-10-CM | POA: Diagnosis present

## 2020-01-06 DIAGNOSIS — J9690 Respiratory failure, unspecified, unspecified whether with hypoxia or hypercapnia: Secondary | ICD-10-CM | POA: Diagnosis present

## 2020-01-06 DIAGNOSIS — Z781 Physical restraint status: Secondary | ICD-10-CM

## 2020-01-06 DIAGNOSIS — E876 Hypokalemia: Secondary | ICD-10-CM | POA: Diagnosis not present

## 2020-01-06 DIAGNOSIS — Z4659 Encounter for fitting and adjustment of other gastrointestinal appliance and device: Secondary | ICD-10-CM | POA: Diagnosis not present

## 2020-01-06 DIAGNOSIS — I420 Dilated cardiomyopathy: Secondary | ICD-10-CM | POA: Diagnosis present

## 2020-01-06 DIAGNOSIS — I252 Old myocardial infarction: Secondary | ICD-10-CM

## 2020-01-06 DIAGNOSIS — Z515 Encounter for palliative care: Secondary | ICD-10-CM

## 2020-01-06 DIAGNOSIS — I110 Hypertensive heart disease with heart failure: Secondary | ICD-10-CM | POA: Diagnosis present

## 2020-01-06 DIAGNOSIS — E119 Type 2 diabetes mellitus without complications: Secondary | ICD-10-CM | POA: Diagnosis present

## 2020-01-06 DIAGNOSIS — I428 Other cardiomyopathies: Secondary | ICD-10-CM | POA: Diagnosis present

## 2020-01-06 DIAGNOSIS — F05 Delirium due to known physiological condition: Secondary | ICD-10-CM | POA: Diagnosis not present

## 2020-01-06 DIAGNOSIS — L8990 Pressure ulcer of unspecified site, unspecified stage: Secondary | ICD-10-CM | POA: Insufficient documentation

## 2020-01-06 DIAGNOSIS — Z7984 Long term (current) use of oral hypoglycemic drugs: Secondary | ICD-10-CM

## 2020-01-06 DIAGNOSIS — D72829 Elevated white blood cell count, unspecified: Secondary | ICD-10-CM | POA: Diagnosis not present

## 2020-01-06 DIAGNOSIS — E872 Acidosis, unspecified: Secondary | ICD-10-CM

## 2020-01-06 DIAGNOSIS — R410 Disorientation, unspecified: Secondary | ICD-10-CM

## 2020-01-06 DIAGNOSIS — K219 Gastro-esophageal reflux disease without esophagitis: Secondary | ICD-10-CM | POA: Diagnosis present

## 2020-01-06 DIAGNOSIS — R570 Cardiogenic shock: Secondary | ICD-10-CM | POA: Diagnosis present

## 2020-01-06 DIAGNOSIS — R1310 Dysphagia, unspecified: Secondary | ICD-10-CM | POA: Diagnosis not present

## 2020-01-06 DIAGNOSIS — Z7901 Long term (current) use of anticoagulants: Secondary | ICD-10-CM

## 2020-01-06 DIAGNOSIS — Z6827 Body mass index (BMI) 27.0-27.9, adult: Secondary | ICD-10-CM

## 2020-01-06 DIAGNOSIS — L89152 Pressure ulcer of sacral region, stage 2: Secondary | ICD-10-CM | POA: Diagnosis not present

## 2020-01-06 DIAGNOSIS — G928 Other toxic encephalopathy: Secondary | ICD-10-CM | POA: Diagnosis present

## 2020-01-06 DIAGNOSIS — Z953 Presence of xenogenic heart valve: Secondary | ICD-10-CM

## 2020-01-06 DIAGNOSIS — K5900 Constipation, unspecified: Secondary | ICD-10-CM | POA: Diagnosis not present

## 2020-01-06 DIAGNOSIS — I959 Hypotension, unspecified: Secondary | ICD-10-CM | POA: Diagnosis not present

## 2020-01-06 DIAGNOSIS — F1290 Cannabis use, unspecified, uncomplicated: Secondary | ICD-10-CM | POA: Diagnosis present

## 2020-01-06 DIAGNOSIS — I5022 Chronic systolic (congestive) heart failure: Secondary | ICD-10-CM | POA: Diagnosis not present

## 2020-01-06 DIAGNOSIS — Z8249 Family history of ischemic heart disease and other diseases of the circulatory system: Secondary | ICD-10-CM

## 2020-01-06 DIAGNOSIS — R0602 Shortness of breath: Secondary | ICD-10-CM

## 2020-01-06 DIAGNOSIS — M109 Gout, unspecified: Secondary | ICD-10-CM | POA: Diagnosis present

## 2020-01-06 LAB — LACTIC ACID, PLASMA
Lactic Acid, Venous: 7.7 mmol/L (ref 0.5–1.9)
Lactic Acid, Venous: 2.8 mmol/L (ref 0.5–1.9)

## 2020-01-06 LAB — URINE DRUG SCREEN, QUALITATIVE (ARMC ONLY)
Amphetamines, Ur Screen: NOT DETECTED
Barbiturates, Ur Screen: NOT DETECTED
Benzodiazepine, Ur Scrn: NOT DETECTED
Cannabinoid 50 Ng, Ur ~~LOC~~: POSITIVE — AB
Cocaine Metabolite,Ur ~~LOC~~: NOT DETECTED
MDMA (Ecstasy)Ur Screen: NOT DETECTED
Methadone Scn, Ur: NOT DETECTED
Opiate, Ur Screen: NOT DETECTED
Phencyclidine (PCP) Ur S: NOT DETECTED
Tricyclic, Ur Screen: NOT DETECTED

## 2020-01-06 LAB — BLOOD GAS, ARTERIAL
Acid-base deficit: 10.6 mmol/L — ABNORMAL HIGH (ref 0.0–2.0)
Acid-base deficit: 4.7 mmol/L — ABNORMAL HIGH (ref 0.0–2.0)
Acid-base deficit: 4.3 mmol/L — ABNORMAL HIGH (ref 0.0–2.0)
Bicarbonate: 16.3 mmol/L — ABNORMAL LOW (ref 20.0–28.0)
Bicarbonate: 26.8 mmol/L (ref 20.0–28.0)
Bicarbonate: 19.4 mmol/L — ABNORMAL LOW (ref 20.0–28.0)
Delivery systems: POSITIVE
Expiratory PAP: 6
FIO2: 0.6
FIO2: 0.6
FIO2: 0.5
Inspiratory PAP: 14
MECHVT: 500 mL
MECHVT: 500 mL
O2 Saturation: 94 %
O2 Saturation: 97 %
O2 Saturation: 99.2 %
PEEP: 10 cmH2O
PEEP: 10 cmH2O
Patient temperature: 37
Patient temperature: 37
Patient temperature: 37
RATE: 18 resp/min
RATE: 30 resp/min
pCO2 arterial: 39 mmHg (ref 32.0–48.0)
pCO2 arterial: 77 mmHg (ref 32.0–48.0)
pCO2 arterial: 32 mmHg (ref 32.0–48.0)
pH, Arterial: 7.23 — ABNORMAL LOW (ref 7.350–7.450)
pH, Arterial: 7.15 — CL (ref 7.350–7.450)
pH, Arterial: 7.39 (ref 7.350–7.450)
pO2, Arterial: 84 mmHg (ref 83.0–108.0)
pO2, Arterial: 114 mmHg — ABNORMAL HIGH (ref 83.0–108.0)
pO2, Arterial: 145 mmHg — ABNORMAL HIGH (ref 83.0–108.0)

## 2020-01-06 LAB — GLUCOSE, CAPILLARY
Glucose-Capillary: 136 mg/dL — ABNORMAL HIGH (ref 70–99)
Glucose-Capillary: 183 mg/dL — ABNORMAL HIGH (ref 70–99)
Glucose-Capillary: 194 mg/dL — ABNORMAL HIGH (ref 70–99)
Glucose-Capillary: 250 mg/dL — ABNORMAL HIGH (ref 70–99)

## 2020-01-06 LAB — CBC
HCT: 52.3 % — ABNORMAL HIGH (ref 39.0–52.0)
Hemoglobin: 16.3 g/dL (ref 13.0–17.0)
MCH: 20.8 pg — ABNORMAL LOW (ref 26.0–34.0)
MCHC: 31.2 g/dL (ref 30.0–36.0)
MCV: 66.8 fL — ABNORMAL LOW (ref 80.0–100.0)
Platelets: 764 10*3/uL — ABNORMAL HIGH (ref 150–400)
RBC: 7.83 MIL/uL — ABNORMAL HIGH (ref 4.22–5.81)
RDW: 25.7 % — ABNORMAL HIGH (ref 11.5–15.5)
WBC: 11.9 10*3/uL — ABNORMAL HIGH (ref 4.0–10.5)
nRBC: 0 % (ref 0.0–0.2)

## 2020-01-06 LAB — BASIC METABOLIC PANEL
Anion gap: 10 (ref 5–15)
BUN: 14 mg/dL (ref 8–23)
CO2: 22 mmol/L (ref 22–32)
Calcium: 9.2 mg/dL (ref 8.9–10.3)
Chloride: 108 mmol/L (ref 98–111)
Creatinine, Ser: 0.85 mg/dL (ref 0.61–1.24)
GFR, Estimated: >60 mL/min (ref >60)
Glucose, Bld: 128 mg/dL — ABNORMAL HIGH (ref 70–99)
Potassium: 4.1 mmol/L (ref 3.5–5.1)
Sodium: 140 mmol/L (ref 135–145)

## 2020-01-06 LAB — TROPONIN I (HIGH SENSITIVITY)
Troponin I (High Sensitivity): 71 ng/L — ABNORMAL HIGH (ref <18)
Troponin I (High Sensitivity): 91 ng/L — ABNORMAL HIGH (ref <18)

## 2020-01-06 LAB — MRSA PCR SCREENING: MRSA by PCR: NEGATIVE

## 2020-01-06 LAB — BRAIN NATRIURETIC PEPTIDE: B Natriuretic Peptide: 1046.8 pg/mL — ABNORMAL HIGH (ref 0.0–100.0)

## 2020-01-06 MED ORDER — ASPIRIN EC 81 MG PO TBEC: 81.0000 mg | DELAYED_RELEASE_TABLET | Freq: Every day | ORAL | Status: DC

## 2020-01-06 MED ORDER — SACUBITRIL-VALSARTAN 24-26 MG PO TABS
1.0000 | ORAL_TABLET | Freq: Two times a day (BID) | ORAL | Status: DC
  Administered 2020-01-06 – 2020-01-07 (×2): 1
  Filled 2020-01-06 (×3): qty 1

## 2020-01-06 MED ORDER — ALBUTEROL SULFATE HFA 108 (90 BASE) MCG/ACT IN AERS: 2.0000 | INHALATION_SPRAY | Freq: Once | RESPIRATORY_TRACT | Status: DC | PRN

## 2020-01-06 MED ORDER — DILTIAZEM HCL 25 MG/5ML IV SOLN
20.0000 mg | Freq: Once | INTRAVENOUS | Status: AC
  Administered 2020-01-06: 20 mg via INTRAVENOUS

## 2020-01-06 MED ORDER — EPINEPHRINE 0.3 MG/0.3ML IJ SOAJ: 0.3000 mg | Freq: Once | INTRAMUSCULAR | Status: DC | PRN

## 2020-01-06 MED ORDER — LORAZEPAM 2 MG/ML IJ SOLN
INTRAMUSCULAR | Status: AC
  Filled 2020-01-06: qty 1

## 2020-01-06 MED ORDER — MAGNESIUM SULFATE IN D5W 1-5 GM/100ML-% IV SOLN
1.0000 g | Freq: Once | INTRAVENOUS | Status: AC
  Administered 2020-01-06: 1 g via INTRAVENOUS
  Filled 2020-01-06 (×2): qty 100

## 2020-01-06 MED ORDER — SODIUM CHLORIDE 0.9% FLUSH
3.0000 mL | Freq: Two times a day (BID) | INTRAVENOUS | Status: DC
  Administered 2020-01-06 – 2020-01-23 (×23): 3 mL via INTRAVENOUS

## 2020-01-06 MED ORDER — FAMOTIDINE 20 MG PO TABS: 20.0000 mg | ORAL_TABLET | Freq: Every day | ORAL | Status: DC

## 2020-01-06 MED ORDER — SODIUM CHLORIDE 0.9 % IV SOLN: 100.0000 mg | Freq: Once | INTRAVENOUS | Status: DC

## 2020-01-06 MED ORDER — ROCURONIUM BROMIDE 50 MG/5ML IV SOLN
100.0000 mg | Freq: Once | INTRAVENOUS | Status: AC
  Administered 2020-01-06: 100 mg via INTRAVENOUS

## 2020-01-06 MED ORDER — DOCUSATE SODIUM 100 MG PO CAPS: 100.0000 mg | ORAL_CAPSULE | Freq: Two times a day (BID) | ORAL | Status: DC | PRN

## 2020-01-06 MED ORDER — ENOXAPARIN SODIUM 40 MG/0.4ML ~~LOC~~ SOLN: 40.0000 mg | SUBCUTANEOUS | Status: DC

## 2020-01-06 MED ORDER — ASPIRIN 81 MG PO CHEW
81.0000 mg | CHEWABLE_TABLET | Freq: Every day | ORAL | Status: DC
  Administered 2020-01-07 – 2020-01-11 (×5): 81 mg
  Filled 2020-01-06 (×5): qty 1

## 2020-01-06 MED ORDER — FAMOTIDINE IN NACL 20-0.9 MG/50ML-% IV SOLN: 20.0000 mg | Freq: Once | INTRAVENOUS | Status: DC | PRN

## 2020-01-06 MED ORDER — ONDANSETRON HCL 4 MG/2ML IJ SOLN: 4.0000 mg | Freq: Four times a day (QID) | INTRAMUSCULAR | Status: DC | PRN

## 2020-01-06 MED ORDER — ALBUTEROL SULFATE HFA 108 (90 BASE) MCG/ACT IN AERS
2.0000 | INHALATION_SPRAY | RESPIRATORY_TRACT | Status: DC | PRN
  Filled 2020-01-06: qty 6.7

## 2020-01-06 MED ORDER — SODIUM CHLORIDE 0.9 % IV SOLN: INTRAVENOUS | Status: DC | PRN

## 2020-01-06 MED ORDER — DIPHENHYDRAMINE HCL 50 MG/ML IJ SOLN: 50.0000 mg | Freq: Once | INTRAMUSCULAR | Status: DC | PRN

## 2020-01-06 MED ORDER — CHLORHEXIDINE GLUCONATE CLOTH 2 % EX PADS
6.0000 | MEDICATED_PAD | Freq: Every day | CUTANEOUS | Status: DC
  Administered 2020-01-06 – 2020-02-13 (×36): 6 via TOPICAL

## 2020-01-06 MED ORDER — ATORVASTATIN CALCIUM 20 MG PO TABS
40.0000 mg | ORAL_TABLET | Freq: Every day | ORAL | Status: DC
  Administered 2020-01-06 – 2020-01-09 (×4): 40 mg via ORAL
  Filled 2020-01-06 (×4): qty 2

## 2020-01-06 MED ORDER — PROPOFOL 1000 MG/100ML IV EMUL
5.0000 ug/kg/min | INTRAVENOUS | Status: DC
  Administered 2020-01-06: 5 ug/kg/min via INTRAVENOUS
  Administered 2020-01-07: 20 ug/kg/min via INTRAVENOUS
  Filled 2020-01-06 (×2): qty 100

## 2020-01-06 MED ORDER — SODIUM CHLORIDE 0.9 % IV SOLN
250.0000 mL | INTRAVENOUS | Status: DC | PRN
  Administered 2020-01-06 – 2020-01-09 (×2): 250 mL via INTRAVENOUS

## 2020-01-06 MED ORDER — POLYETHYLENE GLYCOL 3350 17 G PO PACK: 17.0000 g | PACK | Freq: Every day | ORAL | Status: DC | PRN

## 2020-01-06 MED ORDER — ASPIRIN 81 MG PO CHEW
324.0000 mg | CHEWABLE_TABLET | Freq: Once | ORAL | Status: AC
  Administered 2020-01-06: 324 mg via ORAL
  Filled 2020-01-06: qty 4

## 2020-01-06 MED ORDER — METHYLPREDNISOLONE SODIUM SUCC 125 MG IJ SOLR: 125.0000 mg | Freq: Once | INTRAMUSCULAR | Status: DC | PRN

## 2020-01-06 MED ORDER — DOCUSATE SODIUM 50 MG/5ML PO LIQD: 100.0000 mg | Freq: Two times a day (BID) | ORAL | Status: DC | PRN

## 2020-01-06 MED ORDER — SODIUM CHLORIDE 0.9% FLUSH
3.0000 mL | INTRAVENOUS | Status: DC | PRN
  Administered 2020-01-16: 3 mL via INTRAVENOUS

## 2020-01-06 MED ORDER — ALLOPURINOL 300 MG PO TABS: 300.0000 mg | ORAL_TABLET | Freq: Every day | ORAL | Status: DC

## 2020-01-06 MED ORDER — APIXABAN 5 MG PO TABS
5.0000 mg | ORAL_TABLET | Freq: Two times a day (BID) | ORAL | Status: DC
  Administered 2020-01-06 – 2020-01-11 (×10): 5 mg
  Filled 2020-01-06 (×10): qty 1

## 2020-01-06 MED ORDER — NITROGLYCERIN 0.4 MG SL SUBL: 0.4000 mg | SUBLINGUAL_TABLET | SUBLINGUAL | Status: DC | PRN

## 2020-01-06 MED ORDER — FAMOTIDINE IN NACL 20-0.9 MG/50ML-% IV SOLN
20.0000 mg | Freq: Two times a day (BID) | INTRAVENOUS | Status: DC
  Administered 2020-01-06 – 2020-01-07 (×2): 20 mg via INTRAVENOUS
  Filled 2020-01-06 (×2): qty 50

## 2020-01-06 MED ORDER — FUROSEMIDE 10 MG/ML IJ SOLN: 40.0000 mg | Freq: Two times a day (BID) | INTRAMUSCULAR | Status: DC

## 2020-01-06 MED ORDER — ORAL CARE MOUTH RINSE
15.0000 mL | OROMUCOSAL | Status: DC
  Administered 2020-01-06 – 2020-01-12 (×57): 15 mL via OROMUCOSAL

## 2020-01-06 MED ORDER — ETOMIDATE 2 MG/ML IV SOLN
20.0000 mg | Freq: Once | INTRAVENOUS | Status: AC
  Administered 2020-01-06: 20 mg via INTRAVENOUS

## 2020-01-06 MED ORDER — DILTIAZEM HCL-DEXTROSE 125-5 MG/125ML-% IV SOLN (PREMIX)
5.0000 mg/h | INTRAVENOUS | Status: DC
  Administered 2020-01-06: 10 mg/h via INTRAVENOUS
  Filled 2020-01-06: qty 125

## 2020-01-06 MED ORDER — SODIUM CHLORIDE 0.9 % IV SOLN
250.0000 mL | INTRAVENOUS | Status: DC
  Administered 2020-01-12 – 2020-01-22 (×3): 250 mL via INTRAVENOUS

## 2020-01-06 MED ORDER — SACUBITRIL-VALSARTAN 24-26 MG PO TABS
1.0000 | ORAL_TABLET | Freq: Two times a day (BID) | ORAL | Status: DC
  Filled 2020-01-06: qty 1

## 2020-01-06 MED ORDER — APIXABAN 5 MG PO TABS: 5.0000 mg | ORAL_TABLET | Freq: Two times a day (BID) | ORAL | Status: DC

## 2020-01-06 MED ORDER — ALLOPURINOL 300 MG PO TABS
300.0000 mg | ORAL_TABLET | Freq: Every day | ORAL | Status: DC
  Administered 2020-01-06 – 2020-01-11 (×6): 300 mg
  Filled 2020-01-06 (×6): qty 1

## 2020-01-06 MED ORDER — ACETAMINOPHEN 325 MG PO TABS
650.0000 mg | ORAL_TABLET | ORAL | Status: DC | PRN
  Administered 2020-01-07 – 2020-01-08 (×2): 650 mg via ORAL
  Filled 2020-01-06 (×2): qty 2

## 2020-01-06 MED ORDER — FENTANYL 2500MCG IN NS 250ML (10MCG/ML) PREMIX INFUSION
0.0000 ug/h | INTRAVENOUS | Status: DC
  Administered 2020-01-06: 25 ug/h via INTRAVENOUS
  Administered 2020-01-07 – 2020-01-10 (×5): 150 ug/h via INTRAVENOUS
  Administered 2020-01-11: 200 ug/h via INTRAVENOUS
  Filled 2020-01-06 (×7): qty 250

## 2020-01-06 MED ORDER — NOREPINEPHRINE 4 MG/250ML-% IV SOLN
INTRAVENOUS | Status: AC
  Administered 2020-01-06: 2 ug/min via INTRAVENOUS
  Filled 2020-01-06: qty 250

## 2020-01-06 MED ORDER — FUROSEMIDE 10 MG/ML IJ SOLN
80.0000 mg | Freq: Two times a day (BID) | INTRAMUSCULAR | Status: DC
  Administered 2020-01-06 – 2020-01-07 (×2): 80 mg via INTRAVENOUS
  Filled 2020-01-06 (×3): qty 8

## 2020-01-06 MED ORDER — CHLORHEXIDINE GLUCONATE 0.12% ORAL RINSE (MEDLINE KIT)
15.0000 mL | Freq: Two times a day (BID) | OROMUCOSAL | Status: DC
  Administered 2020-01-06 – 2020-01-12 (×12): 15 mL via OROMUCOSAL

## 2020-01-06 MED ORDER — FAMOTIDINE IN NACL 20-0.9 MG/50ML-% IV SOLN: 20.0000 mg | Freq: Two times a day (BID) | INTRAVENOUS | Status: DC

## 2020-01-06 MED ORDER — METOPROLOL SUCCINATE ER 50 MG PO TB24: 25.0000 mg | ORAL_TABLET | Freq: Every day | ORAL | Status: DC

## 2020-01-06 MED ORDER — LORAZEPAM 2 MG/ML IJ SOLN
1.0000 mg | Freq: Once | INTRAMUSCULAR | Status: AC
  Administered 2020-01-06: 1 mg via INTRAVENOUS

## 2020-01-06 MED ORDER — NOREPINEPHRINE 4 MG/250ML-% IV SOLN
2.0000 ug/min | INTRAVENOUS | Status: DC
  Administered 2020-01-07: 10 ug/min via INTRAVENOUS
  Filled 2020-01-06: qty 250

## 2020-01-06 MED ORDER — FUROSEMIDE 10 MG/ML IJ SOLN
80.0000 mg | Freq: Once | INTRAMUSCULAR | Status: AC
  Administered 2020-01-06: 80 mg via INTRAVENOUS
  Filled 2020-01-06: qty 8

## 2020-01-06 NOTE — ED Notes (Signed)
This Rn attempted to call report at this time.

## 2020-01-06 NOTE — Progress Notes (Signed)
Pt arrived on unit at this time, intubated and sedated with prop and fentanyl. Cardizem gtt also infusing. VSS. Foley and OGT in place. No family present.

## 2020-01-06 NOTE — ED Notes (Signed)
Per Darl Pikes in pharmacy, fentanyl and propofol can be run through same IV

## 2020-01-06 NOTE — ED Triage Notes (Addendum)
Patient c/o sharp chest pain and sob that started this AM. Positive for covid 11/30/19

## 2020-01-06 NOTE — Progress Notes (Addendum)
eLink Physician-Brief Progress Note Patient Name: Travis Maxwell DOB: 12-Dec-1950 MRN: [id number]   Date of Service  01/06/2020  HPI/Events of Note  BP 78/56. Patient is intubated. Here with decompensated HFrEF (EF 20%) c/b cardiogenic shock (lactate 7.7, hypotension). Relatively flat troponins (71 -> 91).   eICU Interventions  Start peripheral levophed to achieve MAP of 65.  Check repeat ABG now (Prior had significant respiratory acidosis and now he has a lactate of ~7).  D/c metoprolol and nicardipine given cardiogenic shock.   ADDENDUM 01/06/20 8:31 PM - BP improved on levophed. - ABG much improved on current vent settings: 7.39/32/145/19.4/BE -4.3. - Changed Lasix to 80mg  IV BID, next dose at 2100. - Ordered twice daily BMP. - Monitor UOP response.  Intervention Category Major Interventions: Hypotension - evaluation and management  2101 01/06/2020, 7:49 PM

## 2020-01-06 NOTE — H&P (Signed)
Name: Travis Maxwell MRN: [id number] DOB: 16-Jan-1951     CONSULTATION DATE: 01/06/2020  REFERRING MD :  Katrinka Blazing  CHIEF COMPLAINT: Resp distress     HISTORY OF PRESENT ILLNESS:   69 y.o. malewho presents to the ED for evaluation of chest pain or shortness of breath. Dx of COVID 11/2019 Diagnosis of with severe systolic CHF EF 20%  -history of HTN, DM, GERD, tobacco abuse, CAD s/p stenting, AVR with bioprosthetic valve, EF of 20%.  Paroxysmal A. fib on Eliquis. Covid + 11/30/2019.  Patient presents to the ED in respiratory distress complaining of chest pain and shortness of breath.  He is reporting orthopnea and increased swelling.  History is limited due to acuity of condition and patient's respiratory status. Patient was placed on biPAP, patient progressive worsened and was emergently intubated  Patient has severe metabolic acidosis +MARIJUANA  SIGNIFICANT EVENTS 1154 EKG with concerning ischemic changes, though difficult to tell if this is rate related. First trop is slightly elevated. Working on access to provide diltiazem, then will repeat ekg   [DS]  1210 Diltiazem provided and repeat EKGs demonstrate inferior ischemic changes.  When I compare this to his admission last month looks similar, though slightly worse degree of elevations.  He reports improving symptoms.  Aspirin provided.  I paged the STEMI provider on call   [DS]  1222 Spoke with Dr. Kirke Corin, STEMI cardiologist on-call, who is reviewed EKGs and agrees that while EKG today is of worse magnitude with ischemic changes, it is not STEMI criteria at this time.  He recommends management of CHF exacerbation with serial cardiac enzymes.         PAST MEDICAL HISTORY :   has a past medical history of CAD (coronary artery disease), Chronic systolic (congestive) heart failure (HCC), Diabetes mellitus without complication (HCC), GERD (gastroesophageal reflux disease), Gout, aortic valve replacement, and Hypertension.  has a  past surgical history that includes LEFT HEART CATH AND CORONARY ANGIOGRAPHY (N/A, 07/25/2017) and CORONARY STENT INTERVENTION (N/A, 07/25/2017). Prior to Admission medications   Medication Sig Start Date End Date Taking? Authorizing Provider  albuterol (VENTOLIN HFA) 108 (90 Base) MCG/ACT inhaler Inhale 2 puffs into the lungs every 4 (four) hours as needed for wheezing or shortness of breath. 12/02/19  Yes Enedina Finner, MD  allopurinol (ZYLOPRIM) 300 MG tablet Take 300 mg by mouth daily. 03/22/18  Yes [provider]  apixaban (ELIQUIS) 5 MG TABS tablet Take 1 tablet (5 mg total) by mouth 2 (two) times daily. 06/13/19  Yes Menshew, Charlesetta Ivory, PA-C  aspirin EC 81 MG EC tablet Take 1 tablet (81 mg total) by mouth daily. 08/15/19  Yes Albertine Grates, MD  colchicine 0.6 MG tablet Take 2 tabs PO x 1, then 1 tab PO 1 hour later x 1 Max: 1.8 mg total dose per attack, do not repeat within 3 days. 06/13/19  Yes Menshew, Charlesetta Ivory, PA-C  metoprolol succinate (TOPROL-XL) 25 MG 24 hr tablet Take 25 mg by mouth daily.   Yes [provider]  nitroGLYCERIN (NITROSTAT) 0.4 MG SL tablet Place 1 tablet (0.4 mg total) under the tongue every 5 (five) minutes as needed for chest pain. 08/14/19  Yes Albertine Grates, MD  sitaGLIPtin (JANUVIA) 100 MG tablet Take 100 mg by mouth daily.   Yes [provider]   Allergies  Allergen Reactions  . Penicillins Other (See Comments)    Has patient had a PCN reaction causing immediate rash, facial/tongue/throat swelling, SOB or lightheadedness  with hypotension: Unknown Has patient had a PCN reaction causing severe rash involving mucus membranes or skin necrosis: Unknown Has patient had a PCN reaction that required hospitalization: Unknown Has patient had a PCN reaction occurring within the last 10 years: No If all of the above answers are "NO", then may proceed with Cephalosporin use.     FAMILY HISTORY:  family history includes CAD in his father and  mother. SOCIAL HISTORY:  reports that he has been smoking cigarettes. He has been smoking about 0.50 packs per day. He has never used smokeless tobacco. He reports previous alcohol use. He reports current drug use. Drug: Marijuana.  REVIEW OF SYSTEMS:   Unable to obtain due to critical illness      Estimated body mass index is 27.17 kg/m as calculated from the following:   Height as of this encounter: 5\' 7"  (1.702 m).   Weight as of this encounter: 78.7 kg.    VITAL SIGNS: Temp:  [99.7 F (37.6 C)] 99.7 F (37.6 C) (11/01 1500) Pulse Rate:  [95-136] 96 (11/01 1500) Resp:  [14-36] 18 (11/01 1500) BP: (124-171)/(95-126) 124/97 (11/01 1500) SpO2:  [95 %-99 %] 98 % (11/01 1500) FiO2 (%):  [60 %-100 %] 60 % (11/01 1446) Weight:  [72.6 kg-78.7 kg] 78.7 kg (11/01 1430)   No intake/output data recorded. No intake/output data recorded.   SpO2: 98 % FiO2 (%): 60 % (per spo2)   Physical Examination:  GENERAL:critically ill appearing, +resp distress HEAD: Normocephalic, atraumatic.  EYES: Pupils equal, round, reactive to light.  No scleral icterus.  MOUTH: Moist mucosal membrane. NECK: Supple. No JVD.  PULMONARY: +rhonchi, +wheezing CARDIOVASCULAR: S1 and S2. Regular rate and rhythm. No murmurs, rubs, or gallops.  GASTROINTESTINAL: Soft, nontender, -distended.  Positive bowel sounds.  MUSCULOSKELETAL: No swelling, clubbing, or edema.  NEUROLOGIC: obtunded SKIN:intact,warm,dry  CBC    Component Value Date/Time   WBC 11.9 (H) 01/06/2020 0821   RBC 7.83 (H) 01/06/2020 0821   HGB 16.3 01/06/2020 0821   HCT 52.3 (H) 01/06/2020 0821   PLT 764 (H) 01/06/2020 0821   MCV 66.8 (L) 01/06/2020 0821   MCH 20.8 (L) 01/06/2020 0821   MCHC 31.2 01/06/2020 0821   RDW 25.7 (H) 01/06/2020 0821   LYMPHSABS 2.0 12/02/2019 0557   MONOABS 0.8 12/02/2019 0557   EOSABS 0.0 12/02/2019 0557   BASOSABS 0.1 12/02/2019 0557   BMP Latest Ref Rng & Units 01/06/2020 12/02/2019 12/01/2019  Glucose  70 - 99 mg/dL 12/03/2019) 937(D) 428(J)  BUN 8 - 23 mg/dL 14 23 14   Creatinine 0.61 - 1.24 mg/dL 681(L 5.72  Sodium 135 - 145 mmol/L 140 135 140  Potassium 3.5 - 5.1 mmol/L 4.1 4.7 3.4(L)  Chloride 98 - 111 mmol/L 108 103 108  CO2 22 - 32 mmol/L 22 21(L) 20(L)  Calcium 8.9 - 10.3 mg/dL 9.2 9.0 6.20)     MEDICATIONS: I have reviewed all medications and confirmed regimen as documented   CULTURE RESULTS   No results found for this or any previous visit (from the past 240 hour(s)).        IMAGING    DG Chest 1 View  Result Date: 01/06/2020 CLINICAL DATA:  Post intubation EXAM: CHEST  1 VIEW COMPARISON:  01/06/2020 at 0833 hours FINDINGS: Endotracheal tube terminates 3.5 cm above the carina. Mild increased perihilar markings, favoring mild interstitial edema. Mild left basilar opacity, possibly atelectasis. Trace left pleural effusion. No pneumothorax. Cardiomegaly.  Prosthetic valve. Enteric tube terminates in the  proximal gastric body. 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 IMPRESSION: Endotracheal tube terminates 3.5 cm above the carina. Suspected mild interstitial edema with trace left pleural effusion. Mild left basilar opacity, likely atelectasis. Electronically Signed   By: Charline Bills M.D.   On: 01/06/2020 14:16   DG Chest 2 View  Result Date: 01/06/2020 CLINICAL DATA:  Chest pain and shortness of breath. EXAM: CHEST - 2 VIEW COMPARISON:  11/30/2019 FINDINGS: Stable cardiac enlargement and prior aortic valve replacement. Lungs demonstrate decreased pulmonary edema since the prior study. There is a chronic component of probable underlying pulmonary venous hypertension. No overt airspace edema or pleural fluid. No pneumothorax, airspace consolidation or nodule identified. Bony structures are unremarkable. IMPRESSION: Improved pulmonary edema since the prior study. Chronic pulmonary venous hypertension present. Electronically Signed   By: Irish Lack M.D.    On: 01/06/2020 08:42   DG Abdomen 1 View  Result Date: 01/06/2020 CLINICAL DATA:  NG tube placement EXAM: ABDOMEN - 1 VIEW COMPARISON:  None. FINDINGS: Enteric tube terminates in the proximal gastric body. Nonobstructive bowel gas pattern. Lung bases are clear. Cardiomegaly.  Prosthetic valve. IMPRESSION: Enteric tube terminates in the proximal gastric body. Electronically Signed   By: Charline Bills M.D.   On: 01/06/2020 14:15     Nutrition Status:           Indwelling Urinary Catheter continued, requirement due to   Reason to continue Indwelling Urinary Catheter strict Intake/Output monitoring for hemodynamic instability         Ventilator continued, requirement due to severe respiratory failure   Ventilator Sedation RASS 0 to -2      ASSESSMENT AND PLAN SYNOPSIS  69 yo [name] with severe resp failure due to severe metabolic acidosis with severe sCHF exacerbation previous h/o COVID in setting of drug abuse  Severe ACUTE Hypoxic and Hypercapnic Respiratory Failure -continue Full MV support -continue Bronchodilator Therapy -Wean Fio2 and PEEP as tolerated -VAP/VENT bundle implementation  ACUTE SYSTOLIC CARDIAC FAILURE- EF 20% -oxygen as needed -Lasix as tolerated -follow up cardiac enzymes as indicated -follow up cardiology recs   NEUROLOGY Acute toxic metabolic encephalopathy, need for sedation Goal RASS -2 to -3  CARDIAC AFIB with RVR ICU monitoring On Cardizem   GI GI PROPHYLAXIS as indicated  NUTRITIONAL STATUS DIET-->TF's as tolerated Constipation protocol as indicated   ENDO - will use ICU hypoglycemic\Hyperglycemia protocol if needed    ELECTROLYTES -follow labs as needed -replace as needed -pharmacy consultation and following    DVT/GI PRX ordered and assessed TRANSFUSIONS AS NEEDED MONITOR FSBS I Assessed the need for Labs I Assessed the need for Foley I Assessed the need for Central Venous Line Family Discussion when  available I Assessed the need for Mobilization I made an Assessment of medications to be adjusted accordingly Safety Risk assessment Completed  CASE DISCUSSED IN MULTIDISCIPLINARY ROUNDS WITH ICU TEAM   Critical Care Time devoted to patient care services described in this note is 79 minutes.   Overall, patient is critically ill, prognosis is guarded.  Patient with Multiorgan failure and at high risk for cardiac arrest and death.    Lucie Leather, M.D.  [facility]  Medical Director [facility] Medical Director [facility] Cardio-Pulmonary Department

## 2020-01-06 NOTE — ED Provider Notes (Signed)
[facility] Emergency Department Provider Note ____________________________________________   First MD Initiated Contact with Patient 01/06/20 1127     (approximate)  I have reviewed the triage vital signs and the nursing notes.  HISTORY  Chief Complaint Shortness of Breath and Chest Pain   HPI Travis Maxwell is a 69 y.o. malewho presents to the ED for evaluation of chest pain or shortness of breath.  Chart review indicates history of HTN, DM, GERD, tobacco abuse, CAD s/p stenting, AVR with bioprosthetic valve, EF of 20%.  Paroxysmal A. fib on Eliquis. Covid + 11/30/2019.  Patient presents to the ED in respiratory distress complaining of chest pain and shortness of breath.  He is reporting orthopnea and increased swelling.  History is limited due to acuity of condition and patient's respiratory status.  Past Medical History:  Diagnosis Date  . CAD (coronary artery disease)   . Chronic systolic (congestive) heart failure ([place])   . Diabetes mellitus without complication ([place])   . GERD (gastroesophageal reflux disease)   . Gout   . Hx of aortic valve replacement    Bioprosthetic valve  . Hypertension     Patient Active Problem List   Diagnosis Date Noted  . Respiratory failure ([place]) 01/06/2020  . Elevated troponin 11/30/2019  . Leukocytosis 11/30/2019  . PAF (paroxysmal atrial fibrillation) ([place]) 11/30/2019  . Acute on chronic systolic (congestive) heart failure ([place]) 11/30/2019  . Pneumonia due to COVID-19 virus 11/30/2019  . Chest pain 08/13/2019  . Diabetes mellitus without complication ([place])   . Gout   . Hypertension   . Dizziness   . Acute on chronic systolic CHF (congestive heart failure) ([place])   . Tobacco abuse   . GERD (gastroesophageal reflux disease)   . CAD (coronary artery disease)   . NSTEMI (non-ST elevated myocardial infarction) ([place]) 07/24/2017    Past Surgical History:  Procedure Laterality Date  . CORONARY STENT  INTERVENTION N/A 07/25/2017   Procedure: CORONARY STENT INTERVENTION;  Surgeon: Yolonda Kida, MD;  Location: [facility];  Service: Cardiovascular;  Laterality: N/A;  . LEFT HEART CATH AND CORONARY ANGIOGRAPHY N/A 07/25/2017   Procedure: LEFT HEART CATH AND CORONARY ANGIOGRAPHY;  Surgeon: Corey Skains, MD;  Location: [facility];  Service: Cardiovascular;  Laterality: N/A;    Prior to Admission medications   Medication Sig Start Date End Date Taking? Authorizing Provider  albuterol (VENTOLIN HFA) 108 (90 Base) MCG/ACT inhaler Inhale 2 puffs into the lungs every 4 (four) hours as needed for wheezing or shortness of breath. 12/02/19  Yes Fritzi Mandes, MD  allopurinol (ZYLOPRIM) 300 MG tablet Take 300 mg by mouth daily. 03/22/18  Yes [provider]  apixaban (ELIQUIS) 5 MG TABS tablet Take 1 tablet (5 mg total) by mouth 2 (two) times daily. 06/13/19  Yes Menshew, Dannielle Karvonen, PA-C  aspirin EC 81 MG EC tablet Take 1 tablet (81 mg total) by mouth daily. 08/15/19  Yes Florencia Reasons, MD  colchicine 0.6 MG tablet Take 2 tabs PO x 1, then 1 tab PO 1 hour later x 1 Max: 1.8 mg total dose per attack, do not repeat within 3 days. 06/13/19  Yes Menshew, Dannielle Karvonen, PA-C  metoprolol succinate (TOPROL-XL) 25 MG 24 hr tablet Take 25 mg by mouth daily.   Yes [provider]  nitroGLYCERIN (NITROSTAT) 0.4 MG SL tablet Place 1 tablet (0.4 mg total) under the tongue every 5 (five) minutes as needed for chest pain.  08/14/19  Yes Florencia Reasons, MD  sitaGLIPtin (JANUVIA) 100 MG tablet Take 100 mg by mouth daily.   Yes [provider]    Allergies Penicillins  Family History  Problem Relation Age of Onset  . CAD Mother   . CAD Father     Social History Social History   Tobacco Use  . Smoking status: Current Every Day Smoker    Packs/day: 0.50    Types: Cigarettes    Last attempt to quit: 08/10/2017    Years since quitting: 2.4  . Smokeless tobacco: Never Used   Vaping Use  . Vaping Use: Never used  Substance Use Topics  . Alcohol use: Not Currently  . Drug use: Yes    Types: Marijuana    Comment: today    Review of Systems  Difficult to be accurately assessed due to acuity of condition and patient's respiratory distress. ____________________________________________   PHYSICAL EXAM:  VITAL SIGNS: Vitals:   01/06/20 1405 01/06/20 1406  BP: (!) 142/98   Pulse: (!) 108 (!) 110  Resp: (!) 25 (!) 24  SpO2: 95% 95%      Constitutional: Alert.  Tripoding and diaphoretic.  Follows most simple commands in all 4 extremities without clear deficit.  Disoriented to time, place and situation. Eyes: Conjunctivae are normal. PERRL. EOMI. Head: Atraumatic. Nose: No congestion/rhinnorhea. Mouth/Throat: Mucous membranes are dry.  Oropharynx non-erythematous. Neck: No stridor. No cervical spine tenderness to palpation. Cardiovascular: Tachycardic and irregular. Grossly normal heart sounds.  Good peripheral circulation. Respiratory: Dyspneic and tachypneic with respiratory rate in the 30s.  Audible wheezes externally.  Auscultation reveals poor air movement throughout with bibasilar crackles. Gastrointestinal: Soft , nondistended, nontender to palpation. No abdominal bruits. No CVA tenderness. Musculoskeletal: Pitting edema to bilateral ankles symmetrically without overlying skin changes.  No joint effusions. No signs of acute trauma. Neurologic: No gross focal neurologic deficits are appreciated.  GCS 12 on arrival (E 3, V 4, M 5).  Skin:  Skin is warm, dry and intact. No rash noted. Psychiatric: Unable to assess due to patient's respiratory distress and altered mental status.  ____________________________________________   LABS (all labs ordered are listed, but only abnormal results are displayed)  Labs Reviewed  BASIC METABOLIC PANEL - Abnormal; Notable for the following components:      Result Value   Glucose, Bld 128 (*)    All other  components within normal limits  CBC - Abnormal; Notable for the following components:   WBC 11.9 (*)    RBC 7.83 (*)    HCT 52.3 (*)    MCV 66.8 (*)    MCH 20.8 (*)    RDW 25.7 (*)    Platelets 764 (*)    All other components within normal limits  BRAIN NATRIURETIC PEPTIDE - Abnormal; Notable for the following components:   B Natriuretic Peptide 1,046.8 (*)    All other components within normal limits  BLOOD GAS, ARTERIAL - Abnormal; Notable for the following components:   pH, Arterial 7.23 (*)    Bicarbonate 16.3 (*)    Acid-base deficit 10.6 (*)    All other components within normal limits  LACTIC ACID, PLASMA - Abnormal; Notable for the following components:   Lactic Acid, Venous 7.7 (*)    All other components within normal limits  TROPONIN I (HIGH SENSITIVITY) - Abnormal; Notable for the following components:   Troponin I (High Sensitivity) 71 (*)    All other components within normal limits  TROPONIN I (HIGH SENSITIVITY) -  Abnormal; Notable for the following components:   Troponin I (High Sensitivity) 91 (*)    All other components within normal limits  CULTURE, BLOOD (ROUTINE X 2)  CULTURE, BLOOD (ROUTINE X 2)  URINE DRUG SCREEN, QUALITATIVE ([facility] ONLY)  CBC  CREATININE, SERUM   ____________________________________________  12 Lead EKG  Twelve-lead EKG demonstrates atrial fibrillation with right bundle branch block, tachycardic rates 105-140.  Inferior ST elevations similar to previous on file from September without discrete STEMI criteria. ____________________________________________  RADIOLOGY  ED MD interpretation: 2 view CXR reviewed by me with cardiomegaly and pulmonary vascular congestion without discrete lobar filtration to suggest a bacterial pneumonia.    1 view CXR post intubation demonstrates ETT in good position  1 view KUB post intubation demonstrates OG tube within the stomach  Official radiology report(s): DG Chest 2 View  Result Date:  01/06/2020 CLINICAL DATA:  Chest pain and shortness of breath. EXAM: CHEST - 2 VIEW COMPARISON:  11/30/2019 FINDINGS: Stable cardiac enlargement and prior aortic valve replacement. Lungs demonstrate decreased pulmonary edema since the prior study. There is a chronic component of probable underlying pulmonary venous hypertension. No overt airspace edema or pleural fluid. No pneumothorax, airspace consolidation or nodule identified. Bony structures are unremarkable. IMPRESSION: Improved pulmonary edema since the prior study. Chronic pulmonary venous hypertension present. Electronically Signed   By: Aletta Edouard M.D.   On: 01/06/2020 08:42    ____________________________________________   PROCEDURES and INTERVENTIONS  Procedure(s) performed (including Critical Care):  .Critical Care Performed by: Vladimir Crofts, MD Authorized by: Vladimir Crofts, MD   Critical care provider statement:    Critical care time (minutes):  31   Critical care was necessary to treat or prevent imminent or life-threatening deterioration of the following conditions:  Cardiac failure, circulatory failure, respiratory failure and CNS failure or compromise   Critical care was time spent personally by me on the following activities:  Discussions with consultants, evaluation of patient's response to treatment, examination of patient, ordering and performing treatments and interventions, ordering and review of laboratory studies, ordering and review of radiographic studies, pulse oximetry, re-evaluation of patient's condition, obtaining history from patient or surrogate and review of old charts Procedure Name: Intubation Date/Time: 01/06/2020 2:18 PM Performed by: Vladimir Crofts, MD Pre-anesthesia Checklist: Patient identified, Patient being monitored, Emergency Drugs available, Timeout performed and Suction available Oxygen Delivery Method: Ambu bag Preoxygenation: Pre-oxygenation with 100% oxygen Induction Type: Rapid  sequence Ventilation: Mask ventilation without difficulty Laryngoscope Size: Glidescope and 3 Grade View: Grade I Tube size: 7.5 mm Number of attempts: 1 Placement Confirmation: ETT inserted through vocal cords under direct vision,  CO2 detector and Breath sounds checked- equal and bilateral Secured at: 24 cm Tube secured with: ETT holder Dental Injury: Teeth and Oropharynx as per pre-operative assessment  Comments: Edentulous and easy airway with copious white frothy secretions throughout the posterior oropharynx and from the trachea.  No evidence of aspiration or blood identified    NG placement  Date/Time: 01/06/2020 2:19 PM Performed by: Vladimir Crofts, MD Authorized by: Vladimir Crofts, MD   Sedation: Patient sedated: yes Sedatives: etomidate  Patient tolerance: patient tolerated the procedure well with no immediate complications Comments: OG tube placement performed by me after endotracheal intubation, visualized via MAC 3 blade on glide scope.  Confirmed with air auscultation of the stomach and subsequent KUB.  Advanced to 65 cm at the mouth and taped to ETT.  Marland Kitchen1-3 Lead EKG Interpretation Performed by: Vladimir Crofts, MD Authorized by: Vladimir Crofts, MD  Interpretation: abnormal     ECG rate:  130   ECG rate assessment: tachycardic     Rhythm: atrial fibrillation     Ectopy: PVCs     Conduction: abnormal     Abnormal conduction comment:  Right bundle    Medications  diltiazem (CARDIZEM) 125 mg in dextrose 5% 125 mL (1 mg/mL) infusion (15 mg/hr Intravenous Rate/Dose Change 01/06/20 1352)  furosemide (LASIX) injection 40 mg (has no administration in time range)  magnesium sulfate IVPB 1 g 100 mL (has no administration in time range)  albuterol (VENTOLIN HFA) 108 (90 Base) MCG/ACT inhaler 2 puff (has no administration in time range)  allopurinol (ZYLOPRIM) tablet 300 mg (has no administration in time range)  apixaban (ELIQUIS) tablet 5 mg (has no administration in time  range)  aspirin EC tablet 81 mg (has no administration in time range)  atorvastatin (LIPITOR) tablet 40 mg (has no administration in time range)  famotidine (PEPCID) tablet 20 mg (has no administration in time range)  metoprolol succinate (TOPROL-XL) 24 hr tablet 25 mg (has no administration in time range)  nitroGLYCERIN (NITROSTAT) SL tablet 0.4 mg (has no administration in time range)  sacubitril-valsartan (ENTRESTO) 24-26 mg per tablet (has no administration in time range)  sodium chloride flush (NS) 0.9 % injection 3 mL (has no administration in time range)  sodium chloride flush (NS) 0.9 % injection 3 mL (has no administration in time range)  0.9 %  sodium chloride infusion (has no administration in time range)  acetaminophen (TYLENOL) tablet 650 mg (has no administration in time range)  docusate sodium (COLACE) capsule 100 mg (has no administration in time range)  polyethylene glycol (MIRALAX / GLYCOLAX) packet 17 g (has no administration in time range)  ondansetron (ZOFRAN) injection 4 mg (has no administration in time range)  famotidine (PEPCID) IVPB 20 mg premix (has no administration in time range)  enoxaparin (LOVENOX) injection 40 mg (has no administration in time range)  famotidine (PEPCID) IVPB 20 mg premix (has no administration in time range)  propofol (DIPRIVAN) 1000 MG/100ML infusion (5 mcg/kg/min  72.6 kg Intravenous New Bag/Given 01/06/20 1400)  fentaNYL 2553mg in NS 2565m(1066mml) infusion-PREMIX (25 mcg/hr Intravenous New Bag/Given 01/06/20 1403)  diltiazem (CARDIZEM) injection 20 mg (20 mg Intravenous Given 01/06/20 1223)  aspirin chewable tablet 324 mg (324 mg Oral Given 01/06/20 1224)  furosemide (LASIX) injection 80 mg (80 mg Intravenous Given 01/06/20 1224)  LORazepam (ATIVAN) injection 1 mg (1 mg Intravenous Given 01/06/20 1232)  etomidate (AMIDATE) injection 20 mg (20 mg Intravenous Given 01/06/20 1334)  rocuronium (ZEMURON) injection 100 mg (100 mg Intravenous Given  01/06/20 1334)    ____________________________________________   MDM / ED COURSE  [age]ar old male with history of CAD and associated systolic CHF presents tripoding and respiratory distress due to CHF exacerbation, ultimately requiring endotracheal intubation and ICU admission.  Patient tachycardic and hypertensive in A. fib with RVR, hypoxic on room air in the 70s? with a low amplitude waveform on pulse oximetry.  Quickly started the patient on BiPAP, provided empiric diltiazem with improvement of his rates and subsequently starting diltiazem drip.  His EKG is concerning for the possibility of STEMI due to his inferior ischemic changes, so I speak with the STEMI cardiologist on-call who reviews his case and indicates that this is more likely precipitated by his heart failure than ACS.  Chest x-ray shows evidence of volume overload.  Blood work shows slight increase to his troponins.  While patient was transiently improving  on BiPAP, due to disorientation and delirium, patient removes all of his assistance devices and monitoring devices.  He clinically worsens after this and necessitates endotracheal intubation for his own protection.  Patient remained stable throughout all of this and we will intubate to the ICU for further work-up and management of his CHF exacerbation with associated A. fib with RVR and altered mental status.  Of note, he does have a lactic acidosis on his blood work but no discrete evidence of infection.  His overall clinical picture is most consistent with a CHF exacerbation.  Blood cultures were drawn and antibiotics were not provided at the time of admission.  This was discussed with the intensivist.  Clinical Course as of Jan 06 1415  Mon Jan 06, 2020  1154 EKG with concerning ischemic changes, though difficult to tell if this is rate related. First trop is slightly elevated. Working on access to provide diltiazem, then will repeat ekg   [DS]  1210 Diltiazem provided and repeat  EKGs demonstrate inferior ischemic changes.  When I compare this to his admission last month looks similar, though slightly worse degree of elevations.  He reports improving symptoms.  Aspirin provided.  I paged the STEMI provider on call   [DS]  1222 Spoke with Dr. Fletcher Anon, STEMI cardiologist on-call, who is reviewed EKGs and agrees that while EKG today is of worse magnitude with ischemic changes, it is not STEMI criteria at this time.  He recommends management of CHF exacerbation with serial cardiac enzymes.   [DS]  6962 XBMWU with Dr. Posey Pronto, admitting hospitalist.  She agrees to admit the patient.   [DS]  1324 Dr. Posey Pronto has seen the patient and asks that I consult pulmonary critical care medicine to admit the patient as she does not feel comfortable.   [DS]  7 Dr. Mortimer Fries with ICU was paged, awaiting callback   [DS]  1314 I walk past the room and patient has disconnected his BiPAP, Dilt drip and removed one of his IVs.  He removed all of his telemetry monitoring was standing in the room by himself at the foot of his bed.  With the help of an RN I bring him back into bed and reattach diltiazem to his other IV, start him on BiPAP.   [DS]  4010 Patient still altered.  I am concerned for his safety and ability to provide appropriate medical care.  We will intubate him   [DS]  1321 Spoke with Dr. Mortimer Fries, ICU, who agrees to evaluate the patient, place orders admit to the ICU.   [DS]    Clinical Course User Index [DS] Vladimir Crofts, MD     ____________________________________________   FINAL CLINICAL IMPRESSION(S) / ED DIAGNOSES  Final diagnoses:  Acute respiratory failure with hypoxia (HCC)  Atrial fibrillation with RVR ([place])  Delirium  Acute on chronic systolic congestive heart failure ([place])  Lactic acidosis     ED Discharge Orders    None       Duong Haydel   Note:  This document was prepared using Dragon voice recognition software and may include unintentional dictation  errors.   Vladimir Crofts, MD 01/06/20 1425

## 2020-01-06 NOTE — ED Notes (Signed)
Medication Reconciliation Report  For Home History Technicians  HIGHLIGHTS:  1. The patient WAS NOT personally interviewed 2. If not, what was the main source used: PHARMACY RECORDS 3. Does the patient appear to take any anti-coagulation agents (e.g. warfarin, Eliquis or Xarelto): YES 4. Does the patient appear to take any anti-convulsant agents (e.g. divalproex, levetiracetam or phenytoin): NO 5. Does the patient appear to use any insulin products (e.g. Lantus, Novolin or Humalog): NO 6. Does the patient appear to take any "beta-blockers" (e.g. metoprolol, carvedilol or bisoprolol: YES  BARRIERS:  1. Were there any barriers that prevented or complicated the medication reconciliation process: YES 2. If yes, what was the primary barrier encountered: Intubation 3. Does the patient appear compliant with prescribed medications: UNABLE TO DETERMINE 4. Does the patient express any barriers with compliance: UNABLE TO DETERMINE 5. What is the primary barrier the patient reports: None   NOTES:[Include any concerns, remarks or complaints the patient expresses regarding medication therapy. Any observations or other information that might be useful to the treatment team can also be included. Immediate needs or concerns should be referred to the RN or appropriate member of the treatment team.]  Intubation prevented patient from participating in home medication interview. Unable to reach family at this time. Pharmacy records [facility]) reveal no fills for Entresto, furosemide or amiodarone in previous 12 months.         Elmo Putt, CPhT  at [facility] [address] [place], [place] [address] [phone]  ** The above is intended solely for informational and/or communicative purposes. It should in no way be considered an endorsement of any specific treatment, therapy or action. **

## 2020-01-06 NOTE — Progress Notes (Signed)
eLink Physician-Brief Progress Note Patient Name: Travis Maxwell DOB: October 29, 1950 MRN: [id number]   Date of Service  01/06/2020  HPI/Events of Note  More frequent PVCs on levophed. Patient has prolonged QTc on EKG performed just now (QTc ). Reviewed EKG and appears unchanged from prior.   eICU Interventions  Draw BMP and Mg. Replete as needed. Use minimum dose of levophed required to maintain MAP >\= 65 mmHg.     Intervention Category Intermediate Interventions: Arrhythmia - evaluation and management  Marveen Reeks Donyetta Ogletree 01/06/2020, 10:40 PM

## 2020-01-06 NOTE — ED Notes (Signed)
Pt becoming increasingly agitated prior to intubation. Pt ripped out 2 IVs and would not remain in bed. Pt constantly taking off bipap and not easily directed.

## 2020-01-06 NOTE — Progress Notes (Signed)
Cardizem stopped due to SBP drop into the 70s. Propofol gtt also titrated down. Dr. Belia Heman made aware.

## 2020-01-06 NOTE — ED Notes (Addendum)
MD Delsie Amador intubating at this time. 7.5 tube, 24 cm at gum line with positive color change

## 2020-01-06 NOTE — ED Notes (Signed)
Pt yelling in lobby stating he needs help.  Pt states no one is doing anything for him.  Pt has been asked several times to keep mask on, pt refuses same.  This RN attempted to obtain updated VS, pt refuses to cooperate with same.  Will attempt to bring pt into a triage room for updated VS and labs.  Will continue to look for room for pt.

## 2020-01-06 NOTE — ED Notes (Signed)
MD Katrinka Blazing at bedside to intubate patient

## 2020-01-07 ENCOUNTER — Inpatient Hospital Stay: Payer: Self-pay

## 2020-01-07 ENCOUNTER — Other Ambulatory Visit: Payer: Self-pay

## 2020-01-07 DIAGNOSIS — E440 Moderate protein-calorie malnutrition: Secondary | ICD-10-CM | POA: Insufficient documentation

## 2020-01-07 DIAGNOSIS — I5021 Acute systolic (congestive) heart failure: Secondary | ICD-10-CM | POA: Diagnosis not present

## 2020-01-07 DIAGNOSIS — J9601 Acute respiratory failure with hypoxia: Secondary | ICD-10-CM | POA: Diagnosis not present

## 2020-01-07 LAB — BASIC METABOLIC PANEL
Anion gap: 12 (ref 5–15)
Anion gap: 14 (ref 5–15)
BUN: 21 mg/dL (ref 8–23)
BUN: 21 mg/dL (ref 8–23)
CO2: 21 mmol/L — ABNORMAL LOW (ref 22–32)
CO2: 20 mmol/L — ABNORMAL LOW (ref 22–32)
Calcium: 8.8 mg/dL — ABNORMAL LOW (ref 8.9–10.3)
Calcium: 8.7 mg/dL — ABNORMAL LOW (ref 8.9–10.3)
Chloride: 108 mmol/L (ref 98–111)
Chloride: 107 mmol/L (ref 98–111)
Creatinine, Ser: 1.26 mg/dL — ABNORMAL HIGH (ref 0.61–1.24)
Creatinine, Ser: 1.36 mg/dL — ABNORMAL HIGH (ref 0.61–1.24)
GFR, Estimated: >60 mL/min (ref >60)
GFR, Estimated: 57 mL/min — ABNORMAL LOW (ref >60)
Glucose, Bld: 158 mg/dL — ABNORMAL HIGH (ref 70–99)
Glucose, Bld: 67 mg/dL — ABNORMAL LOW (ref 70–99)
Potassium: 5.4 mmol/L — ABNORMAL HIGH (ref 3.5–5.1)
Potassium: 4 mmol/L (ref 3.5–5.1)
Sodium: 141 mmol/L (ref 135–145)
Sodium: 141 mmol/L (ref 135–145)

## 2020-01-07 LAB — CBC
HCT: 51.8 % (ref 39.0–52.0)
Hemoglobin: 16.2 g/dL (ref 13.0–17.0)
MCH: 20.9 pg — ABNORMAL LOW (ref 26.0–34.0)
MCHC: 31.3 g/dL (ref 30.0–36.0)
MCV: 66.9 fL — ABNORMAL LOW (ref 80.0–100.0)
Platelets: 887 10*3/uL — ABNORMAL HIGH (ref 150–400)
RBC: 7.74 MIL/uL — ABNORMAL HIGH (ref 4.22–5.81)
RDW: 25.8 % — ABNORMAL HIGH (ref 11.5–15.5)
WBC: 21.9 10*3/uL — ABNORMAL HIGH (ref 4.0–10.5)
nRBC: 0 % (ref 0.0–0.2)

## 2020-01-07 LAB — PROCALCITONIN: Procalcitonin: 5.9 ng/mL

## 2020-01-07 LAB — MAGNESIUM
Magnesium: 2.1 mg/dL (ref 1.7–2.4)
Magnesium: 2.4 mg/dL (ref 1.7–2.4)
Magnesium: 1.2 mg/dL — ABNORMAL LOW (ref 1.7–2.4)
Magnesium: 1.7 mg/dL (ref 1.7–2.4)

## 2020-01-07 LAB — GLUCOSE, CAPILLARY
Glucose-Capillary: 110 mg/dL — ABNORMAL HIGH (ref 70–99)
Glucose-Capillary: 123 mg/dL — ABNORMAL HIGH (ref 70–99)
Glucose-Capillary: 168 mg/dL — ABNORMAL HIGH (ref 70–99)
Glucose-Capillary: 73 mg/dL (ref 70–99)
Glucose-Capillary: 98 mg/dL (ref 70–99)
Glucose-Capillary: 99 mg/dL (ref 70–99)

## 2020-01-07 LAB — PHOSPHORUS: Phosphorus: 1.7 mg/dL — ABNORMAL LOW (ref 2.5–4.6)

## 2020-01-07 LAB — POTASSIUM
Potassium: 3.9 mmol/L (ref 3.5–5.1)
Potassium: 3.4 mmol/L — ABNORMAL LOW (ref 3.5–5.1)

## 2020-01-07 MED ORDER — DEXTROSE 50 % IV SOLN
1.0000 | Freq: Once | INTRAVENOUS | Status: AC
  Administered 2020-01-07: 50 mL via INTRAVENOUS
  Filled 2020-01-07: qty 50

## 2020-01-07 MED ORDER — SODIUM CHLORIDE 0.9% FLUSH
10.0000 mL | Freq: Two times a day (BID) | INTRAVENOUS | Status: DC
  Administered 2020-01-07 – 2020-01-11 (×7): 10 mL
  Administered 2020-01-12: 30 mL
  Administered 2020-01-13: 10 mL
  Administered 2020-01-14: 30 mL
  Administered 2020-01-14 – 2020-01-22 (×12): 10 mL
  Administered 2020-01-23: 20 mL
  Administered 2020-01-23 – 2020-01-24 (×2): 10 mL
  Administered 2020-01-25: 30 mL
  Administered 2020-01-25: 10 mL
  Administered 2020-01-26: 30 mL
  Administered 2020-01-26: 10 mL
  Administered 2020-01-27: 30 mL
  Administered 2020-01-27 – 2020-02-06 (×12): 10 mL
  Administered 2020-02-07: 30 mL
  Administered 2020-02-07 – 2020-02-17 (×15): 10 mL
  Administered 2020-02-17: 22:00:00 20 mL
  Administered 2020-02-18 – 2020-02-20 (×3): 10 mL

## 2020-01-07 MED ORDER — PROSOURCE TF PO LIQD
45.0000 mL | Freq: Three times a day (TID) | ORAL | Status: DC
  Administered 2020-01-07 – 2020-01-11 (×12): 45 mL

## 2020-01-07 MED ORDER — MAGNESIUM SULFATE 2 GM/50ML IV SOLN
2.0000 g | Freq: Once | INTRAVENOUS | Status: AC
  Administered 2020-01-07: 2 g via INTRAVENOUS
  Filled 2020-01-07: qty 50

## 2020-01-07 MED ORDER — SODIUM CHLORIDE 0.9 % IV SOLN
2.0000 g | INTRAVENOUS | Status: DC
  Administered 2020-01-07 – 2020-01-09 (×3): 2 g via INTRAVENOUS
  Filled 2020-01-07: qty 2
  Filled 2020-01-07: qty 20
  Filled 2020-01-07 (×2): qty 2

## 2020-01-07 MED ORDER — FAMOTIDINE IN NACL 20-0.9 MG/50ML-% IV SOLN
20.0000 mg | INTRAVENOUS | Status: DC
  Administered 2020-01-08 – 2020-02-06 (×30): 20 mg via INTRAVENOUS
  Filled 2020-01-07 (×30): qty 50

## 2020-01-07 MED ORDER — FUROSEMIDE 10 MG/ML IJ SOLN
40.0000 mg | Freq: Two times a day (BID) | INTRAMUSCULAR | Status: DC
  Administered 2020-01-08 – 2020-01-14 (×12): 40 mg via INTRAVENOUS
  Filled 2020-01-07 (×12): qty 4

## 2020-01-07 MED ORDER — VITAL 1.5 CAL PO LIQD
1000.0000 mL | ORAL | Status: DC
  Administered 2020-01-07 – 2020-01-10 (×5): 1000 mL

## 2020-01-07 MED ORDER — MIDAZOLAM HCL 2 MG/2ML IJ SOLN: 1.0000 mg | INTRAMUSCULAR | Status: DC | PRN

## 2020-01-07 MED ORDER — SODIUM CHLORIDE 0.9% FLUSH: 10.0000 mL | INTRAVENOUS | Status: DC | PRN

## 2020-01-07 MED ORDER — DOCUSATE SODIUM 50 MG/5ML PO LIQD: 100.0000 mg | Freq: Two times a day (BID) | ORAL | Status: DC

## 2020-01-07 MED ORDER — VITAL HIGH PROTEIN PO LIQD: 1000.0000 mL | ORAL | Status: DC

## 2020-01-07 MED ORDER — MAGNESIUM SULFATE 2 GM/50ML IV SOLN: 2.0000 g | Freq: Once | INTRAVENOUS | Status: DC

## 2020-01-07 MED ORDER — SODIUM ZIRCONIUM CYCLOSILICATE 5 G PO PACK
10.0000 g | PACK | Freq: Two times a day (BID) | ORAL | Status: DC
  Administered 2020-01-07 (×2): 10 g
  Filled 2020-01-07 (×2): qty 2

## 2020-01-07 MED ORDER — PHENYLEPHRINE CONCENTRATED 100MG/250ML (0.4 MG/ML) INFUSION SIMPLE
0.0000 ug/min | INTRAVENOUS | Status: DC
  Administered 2020-01-07: 100 ug/min via INTRAVENOUS
  Administered 2020-01-08: 80 ug/min via INTRAVENOUS
  Administered 2020-01-09: 60 ug/min via INTRAVENOUS
  Filled 2020-01-07 (×3): qty 250

## 2020-01-07 MED ORDER — INSULIN ASPART 100 UNIT/ML IV SOLN
10.0000 [IU] | Freq: Once | INTRAVENOUS | Status: AC
  Administered 2020-01-07: 10 [IU] via INTRAVENOUS
  Filled 2020-01-07: qty 0.1

## 2020-01-07 MED ORDER — MIDAZOLAM 50MG/50ML (1MG/ML) PREMIX INFUSION
0.5000 mg/h | INTRAVENOUS | Status: DC
  Administered 2020-01-07: 0.5 mg/h via INTRAVENOUS
  Administered 2020-01-07 – 2020-01-08 (×3): 5 mg/h via INTRAVENOUS
  Administered 2020-01-09 (×2): 2 mg/h via INTRAVENOUS
  Filled 2020-01-07 (×6): qty 50

## 2020-01-07 MED ORDER — SODIUM CHLORIDE 0.9 % IV SOLN
500.0000 mg | INTRAVENOUS | Status: DC
  Administered 2020-01-08 – 2020-01-10 (×4): 500 mg via INTRAVENOUS
  Filled 2020-01-07 (×5): qty 500

## 2020-01-07 MED ORDER — PHENYLEPHRINE HCL-NACL 10-0.9 MG/250ML-% IV SOLN
0.0000 ug/min | INTRAVENOUS | Status: DC
  Administered 2020-01-07 (×4): 90 ug/min via INTRAVENOUS
  Administered 2020-01-07: 20 ug/min via INTRAVENOUS
  Administered 2020-01-07: 80 ug/min via INTRAVENOUS
  Administered 2020-01-07 (×2): 90 ug/min via INTRAVENOUS
  Filled 2020-01-07 (×9): qty 250

## 2020-01-07 MED ORDER — MAGNESIUM SULFATE 4 GM/100ML IV SOLN
4.0000 g | Freq: Once | INTRAVENOUS | Status: AC
  Administered 2020-01-07: 4 g via INTRAVENOUS
  Filled 2020-01-07: qty 100

## 2020-01-07 MED ORDER — SODIUM CHLORIDE 0.9 % IV SOLN
0.0000 ug/min | INTRAVENOUS | Status: DC
  Filled 2020-01-07: qty 1

## 2020-01-07 MED ORDER — POTASSIUM PHOSPHATES 15 MMOLE/5ML IV SOLN
30.0000 mmol | Freq: Once | INTRAVENOUS | Status: AC
  Administered 2020-01-07: 30 mmol via INTRAVENOUS
  Filled 2020-01-07: qty 10

## 2020-01-07 MED ORDER — POLYETHYLENE GLYCOL 3350 17 G PO PACK
17.0000 g | PACK | Freq: Every day | ORAL | Status: DC
  Administered 2020-01-07 – 2020-01-10 (×4): 17 g via ORAL
  Filled 2020-01-07 (×4): qty 1

## 2020-01-07 NOTE — Progress Notes (Signed)
Called E link for levophed infusion peripherally as pt's BP is labile after stopping cardizem drip. ABG and lactic acid drawn. Frequent PVC's noted after levophed infusion started. EKG done. BMP and Mg drawn. Propofol infusion changed to midazolam infusion. Neosynephrine added to wean off levophed. K of 5.4 treated with 10 units insulin and d50 glucose followed by lokelma per OG.

## 2020-01-07 NOTE — Progress Notes (Signed)
Peripherally Inserted Central Catheter Placement  The IV Nurse has discussed with the patient and/or persons authorized to consent for the patient, the purpose of this procedure and the potential benefits and risks involved with this procedure.  The benefits include less needle sticks, lab draws from the catheter, and the patient may be discharged home with the catheter. Risks include, but not limited to, infection, bleeding, blood clot (thrombus formation), and puncture of an artery; nerve damage and irregular heartbeat and possibility to perform a PICC exchange if needed/ordered by physician.  Alternatives to this procedure were also discussed.  Bard Power PICC patient education guide, fact sheet on infection prevention and patient information card has been provided to patient /or left at bedside.    PICC Placement Documentation  PICC Triple Lumen 01/07/20 PICC Right Basilic 37 cm 0 cm (Active)  Indication for Insertion or Continuance of Line Vasoactive infusions 01/07/20 0920  Exposed Catheter (cm) 0 cm 01/07/20 0920  Site Assessment Clean;Dry;Intact 01/07/20 0920  Lumen #1 Status Flushed;Saline locked;Blood return noted 01/07/20 0920  Lumen #2 Status Flushed;Saline locked;Blood return noted 01/07/20 0920  Lumen #3 Status Flushed;Saline locked;Blood return noted 01/07/20 0920  Dressing Type Transparent;Securing device 01/07/20 0920  Dressing Status Clean;Dry;Intact 01/07/20 0920  Antimicrobial disc in place? Yes 01/07/20 0920  Safety Lock Not Applicable 01/07/20 0920  Dressing Intervention New dressing 01/07/20 0920  Dressing Change Due 01/14/20 01/07/20 0920       Annett Fabian 01/07/2020, 9:36 AM

## 2020-01-07 NOTE — Progress Notes (Signed)
Initial Nutrition Assessment  DOCUMENTATION CODES:   Non-severe (moderate) malnutrition in context of chronic illness  INTERVENTION:  Initiate Vital 1.5 Cal at 15 mL/hr and advance by 20 mL/hr every 8 hours to goal rate of 55 mL/hr (1320 mL goal daily volume) per tube. Also provide PROSource TF 45 mL TID per tube. Goal regimen provides 2100 kcal, 122 grams of protein, 1003 mL H2O daily.  Monitor magnesium, potassium, and phosphorus daily for at least 3 days, MD to replete as needed, as pt is at risk for refeeding syndrome.  NUTRITION DIAGNOSIS:   Moderate Malnutrition related to chronic illness (CHF) as evidenced by moderate fat depletion, moderate muscle depletion.  GOAL:   Patient will meet greater than or equal to 90% of their needs  MONITOR:   Vent status, Labs, Weight trends, TF tolerance, I & O's  REASON FOR ASSESSMENT:   Ventilator, Consult Enteral/tube feeding initiation and management  ASSESSMENT:   69 year old male with PMHx of HTN, DM, gout, hx AVR with bioprosthetic valve, GERD, CAD, CHF (EF 20%), recent COVID-19 11/30/2019 admitted with respiratory failure, severe metabolic acidosis, CHF exacerbation.   11/1 intubated  Patient is currently intubated on ventilator support MV: 15 L/min Temp (24hrs), Avg:99.4 F (37.4 C), Min:98.2 F (36.8 C), Max:100.8 F (38.2 C)  Medications reviewed and include: allopurinol, Lasix 80 mg Q12hrs IV, Miralax, famotidine, fentanyl gtt, Versed gtt, norepinephrine gtt at 3 mcg/min, phenylephrine gtt at 90 mcg/min.  Labs reviewed: CBG 73-123, CO2 20, Creatinine 1.36.  I/O: 1590 mL UOP yesterday  Weight trend: per review of chart patient was 69.1 kg on 08/14/2019 and 69.6 kg on 12/02/2019; current documented to be 79.3 kg (174.83 lbs); will continue to monitor weight trend  Enteral Access: 16 Fr. OGT placed 11/1; terminates in proximal gastric body per abdominal x-ray 11/1  Discussed with RN and on rounds. Plan is to start tube  feeds today.  NUTRITION - FOCUSED PHYSICAL EXAM:    Most Recent Value  Orbital Region Moderate depletion  Upper Arm Region Moderate depletion  Thoracic and Lumbar Region Moderate depletion  Buccal Region Unable to assess  Temple Region Moderate depletion  Clavicle Bone Region Moderate depletion  Clavicle and Acromion Bone Region Moderate depletion  Scapular Bone Region Unable to assess  Dorsal Hand Moderate depletion  Patellar Region Severe depletion  Anterior Thigh Region Severe depletion  Posterior Calf Region Severe depletion  Edema (RD Assessment) None  Hair Reviewed  Eyes Unable to assess  Mouth Unable to assess  Skin Reviewed  Nails Reviewed     Diet Order:   Diet Order            Diet NPO time specified  Diet effective now                EDUCATION NEEDS:   No education needs have been identified at this time  Skin:  Skin Assessment: Reviewed RN Assessment  Last BM:  Unknown/PTA  Height:   Ht Readings from Last 1 Encounters:  01/06/20 5\' 7"  (1.702 m)   Weight:   Wt Readings from Last 1 Encounters:  01/07/20 79.3 kg   Ideal Body Weight:  67.3 kg  BMI:  Body mass index is 27.38 kg/m.  Estimated Nutritional Needs:   Kcal:  2102 (PSU 2003b w/ MSJ 1531, Ve 15, Tmax 38.2)  Protein:  120-130 grams  Fluid:  1.5-1.8 L/day or per MD  08-17-1990, MS, RD, LDN Pager number available on Amion

## 2020-01-07 NOTE — Progress Notes (Signed)
CRITICAL CARE NOTE  69 y.o.malewho presents to the ED for evaluation ofchest pain or shortness of breath. Dx of COVID 11/2019 Diagnosis of with severe systolic CHF EF 02%  -history of HTN, DM, GERD, tobacco abuse, CAD s/p stenting, AVR with bioprosthetic valve, EF of20%. Paroxysmal A. fib on Eliquis. Covid + 11/30/2019.  Patient presents to the ED in respiratory distress complaining of chest pain and shortness of breath. He is reporting orthopnea and increased swelling. History is limited due to acuity of condition and patient's respiratory status. Patient was placed on biPAP, patient progressive worsened and was emergently intubated  Patient has severe metabolic acidosis +MARIJUANA  SIGNIFICANT EVENTS 1154 EKG with concerning ischemic changes, though difficult to tell if this is rate related. First trop is slightly elevated. Working on access to provide diltiazem, then will repeat ekg  [DS]  1210 Diltiazem provided and repeat EKGs demonstrate inferior ischemic changes. When I compare this to his admission last month looks similar, though slightly worse degree of elevations. He reports improving symptoms. Aspirin provided. I paged the STEMI provider on call  [DS]  1222 Spoke with Dr. Fletcher Anon, STEMI cardiologist on-call, who is reviewed EKGs and agrees that while EKG today is of worse magnitude with ischemic changes, it is not STEMI criteria at this time. He recommends management of CHF exacerbation with serial cardiac enzymes.    11/2 severe cardiogenic shock 11/2 remains on vent       CC  follow up respiratory failure  SUBJECTIVE Patient remains critically ill Prognosis is guarded  Vent Mode: PRVC FiO2 (%):  [50 %-100 %] 50 % Set Rate:  [16 bmp-30 bmp] 30 bmp Vt Set:  [450 mL-500 mL] 500 mL PEEP:  [8 cmH20-10 cmH20] 8 cmH20 Plateau Pressure:  [20 cmH20-22 cmH20] 20 cmH20  CBC    Component Value Date/Time   WBC 11.9 (H) 01/06/2020 0821   RBC 7.83 (H)  01/06/2020 0821   HGB 16.3 01/06/2020 0821   HCT 52.3 (H) 01/06/2020 0821   PLT 764 (H) 01/06/2020 0821   MCV 66.8 (L) 01/06/2020 0821   MCH 20.8 (L) 01/06/2020 0821   MCHC 31.2 01/06/2020 0821   RDW 25.7 (H) 01/06/2020 0821   LYMPHSABS 2.0 12/02/2019 0557   MONOABS 0.8 12/02/2019 0557   EOSABS 0.0 12/02/2019 0557   BASOSABS 0.1 12/02/2019 0557   BMP Latest Ref Rng & Units 01/07/2020 01/06/2020 01/06/2020  Glucose 70 - 99 mg/dL 130(H) 158(H) 128(H)  BUN 8 - 23 mg/dL 38(H) 21 14  Creatinine 0.61 - 1.24 mg/dL 0.53(L) 1.26(H) 0.85  Sodium 135 - 145 mmol/L 143 141 140  Potassium 3.5 - 5.1 mmol/L 3.7 5.4(H) 4.1  Chloride 98 - 111 mmol/L 97(L) 108 108  CO2 22 - 32 mmol/L 39(H) 21(L) 22  Calcium 8.9 - 10.3 mg/dL 8.1(L) 8.8(L) 9.2    BP 101/67   Pulse 73   Temp 99.5 F (37.5 C)   Resp (!) 30   Ht 5' 7"  (1.702 m)   Wt 79.3 kg   SpO2 99%   BMI 27.38 kg/m    I/O last 3 completed shifts: In: 1449.6 [I.V.:1099.6; NG/GT:200; IV Piggyback:150] Out: 1590 [Urine:1590] No intake/output data recorded.  SpO2: 99 % FiO2 (%): 50 %  Estimated body mass index is 27.38 kg/m as calculated from the following:   Height as of this encounter: 5' 7"  (1.702 m).   Weight as of this encounter: 79.3 kg.  SIGNIFICANT EVENTS   REVIEW OF SYSTEMS  PATIENT IS UNABLE  TO PROVIDE COMPLETE REVIEW OF SYSTEMS DUE TO SEVERE CRITICAL ILLNESS        PHYSICAL EXAMINATION:  GENERAL:critically ill appearing, +resp distress HEAD: Normocephalic, atraumatic.  EYES: Pupils equal, round, reactive to light.  No scleral icterus.  MOUTH: Moist mucosal membrane. NECK: Supple.  PULMONARY: +rhonchi, +wheezing CARDIOVASCULAR: S1 and S2. Regular rate and rhythm. No murmurs, rubs, or gallops.  GASTROINTESTINAL: Soft, nontender, -distended.  Positive bowel sounds.   MUSCULOSKELETAL: No swelling, clubbing, or edema.  NEUROLOGIC: obtunded, GCS<8 SKIN:intact,warm,dry  MEDICATIONS: I have reviewed all medications  and confirmed regimen as documented   CULTURE RESULTS   Recent Results (from the past 240 hour(s))  Blood culture (routine x 2)     Status: None (Preliminary result)   Collection Time: 01/06/20  1:13 PM   Specimen: BLOOD  Result Value Ref Range Status   Specimen Description BLOOD BLOOD LEFT HAND  Final   Special Requests   Final    BOTTLES DRAWN AEROBIC AND ANAEROBIC Blood Culture adequate volume   Culture   Final    NO GROWTH < 24 HOURS Performed at [facility], [address] [place], [place] [address]    Report Status PENDING  Incomplete  Blood culture (routine x 2)     Status: None (Preliminary result)   Collection Time: 01/06/20  1:26 PM   Specimen: BLOOD  Result Value Ref Range Status   Specimen Description BLOOD LEFT ANTECUBITAL  Final   Special Requests   Final    BOTTLES DRAWN AEROBIC AND ANAEROBIC Blood Culture adequate volume   Culture   Final    NO GROWTH < 24 HOURS Performed at [facility], [address], [place], [place] [address]    Report Status PENDING  Incomplete  MRSA PCR Screening     Status: None   Collection Time: 01/06/20  2:43 PM   Specimen: Nasal Mucosa; Nasopharyngeal  Result Value Ref Range Status   MRSA by PCR NEGATIVE NEGATIVE Final    Comment:        The GeneXpert MRSA Assay (FDA approved for NASAL specimens only), is one component of a comprehensive MRSA colonization surveillance program. It is not intended to diagnose MRSA infection nor to guide or monitor treatment for MRSA infections. Performed at [facility], [address] [place], [place] [address]           IMAGING    DG Chest 1 View  Result Date: 01/06/2020 CLINICAL DATA:  Post intubation EXAM: CHEST  1 VIEW COMPARISON:  01/06/2020 at 0833 hours FINDINGS: Endotracheal tube terminates 3.5 cm above the carina. Mild increased perihilar markings, favoring mild interstitial edema. Mild left basilar opacity, possibly atelectasis. Trace left  pleural effusion. No pneumothorax. Cardiomegaly.  Prosthetic valve. Enteric tube terminates in the proximal gastric body. 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 IMPRESSION: Endotracheal tube terminates 3.5 cm above the carina. Suspected mild interstitial edema with trace left pleural effusion. Mild left basilar opacity, likely atelectasis. Electronically Signed   By: Julian Hy M.D.   On: 01/06/2020 14:16   DG Chest 2 View  Result Date: 01/06/2020 CLINICAL DATA:  Chest pain and shortness of breath. EXAM: CHEST - 2 VIEW COMPARISON:  11/30/2019 FINDINGS: Stable cardiac enlargement and prior aortic valve replacement. Lungs demonstrate decreased pulmonary edema since the prior study. There is a chronic component of probable underlying pulmonary venous hypertension. No overt airspace edema or pleural fluid. No pneumothorax, airspace consolidation or nodule identified. Bony structures are unremarkable.  IMPRESSION: Improved pulmonary edema since the prior study. Chronic pulmonary venous hypertension present. Electronically Signed   By: Aletta Edouard M.D.   On: 01/06/2020 08:42   DG Abdomen 1 View  Result Date: 01/06/2020 CLINICAL DATA:  NG tube placement EXAM: ABDOMEN - 1 VIEW COMPARISON:  None. FINDINGS: Enteric tube terminates in the proximal gastric body. Nonobstructive bowel gas pattern. Lung bases are clear. Cardiomegaly.  Prosthetic valve. IMPRESSION: Enteric tube terminates in the proximal gastric body. Electronically Signed   By: Julian Hy M.D.   On: 01/06/2020 14:15     Nutrition Status:           Indwelling Urinary Catheter continued, requirement due to   Reason to continue Indwelling Urinary Catheter strict Intake/Output monitoring for hemodynamic instability         Ventilator continued, requirement due to severe respiratory failure   Ventilator Sedation RASS 0 to -2      ASSESSMENT AND PLAN SYNOPSIS  69 yo [name] with severe resp failure  due to severe metabolic acidosis with severe sCHF exacerbation previous h/o COVID in setting of drug abuse  Severe ACUTE Hypoxic and Hypercapnic Respiratory Failure -continue Full MV support -continue Bronchodilator Therapy -Wean Fio2 and PEEP as tolerated -will perform SAT/SBT when respiratory parameters are met -VAP/VENT bundle implementation  ACUTE SYSTOLIC CARDIAC FAILURE- EF 20% -oxygen as needed -Lasix as tolerated -follow up cardiac enzymes as indicated -follow up cardiology recs   NEUROLOGY Acute toxic metabolic encephalopathy, need for sedation Goal RASS -2 to -3  SHOCK-CARDIOGENIC -use vasopressors to keep MAP>65 as needed  CARDIAC ICU monitoring   GI GI PROPHYLAXIS as indicated  DIET-->TF's as tolerated Constipation protocol as indicated  ENDO - will use ICU hypoglycemic\Hyperglycemia protocol if indicated    ELECTROLYTES -follow labs as needed -replace as needed -pharmacy consultation and following   DVT/GI PRX ordered and assessed TRANSFUSIONS AS NEEDED MONITOR FSBS I Assessed the need for Labs I Assessed the need for Foley I Assessed the need for Central Venous Line Family Discussion when available I Assessed the need for Mobilization I made an Assessment of medications to be adjusted accordingly Safety Risk assessment completed   CASE DISCUSSED IN MULTIDISCIPLINARY ROUNDS WITH ICU TEAM  Critical Care Time devoted to patient care services described in this note is 45 minutes.   Overall, patient is critically ill, prognosis is guarded.  Patient with Multiorgan failure and at high risk for cardiac arrest and death.   Prognosis is very poor Recommend palliative care consultation  Corrin Parker, M.D.  [facility]  Medical Director [place] Director [facility] Cardio-Pulmonary Department

## 2020-01-08 ENCOUNTER — Inpatient Hospital Stay: Payer: Medicare HMO

## 2020-01-08 DIAGNOSIS — J9601 Acute respiratory failure with hypoxia: Secondary | ICD-10-CM | POA: Diagnosis not present

## 2020-01-08 DIAGNOSIS — I5021 Acute systolic (congestive) heart failure: Secondary | ICD-10-CM | POA: Diagnosis not present

## 2020-01-08 LAB — BLOOD GAS, ARTERIAL
Acid-base deficit: 4.9 mmol/L — ABNORMAL HIGH (ref 0.0–2.0)
Bicarbonate: 20.6 mmol/L (ref 20.0–28.0)
FIO2: 0.45
MECHVT: 400 mL
O2 Saturation: 95.6 %
PEEP: 8 cmH2O
Patient temperature: 37
RATE: 30 resp/min
pCO2 arterial: 39 mmHg (ref 32.0–48.0)
pH, Arterial: 7.33 — ABNORMAL LOW (ref 7.350–7.450)
pO2, Arterial: 85 mmHg (ref 83.0–108.0)

## 2020-01-08 LAB — GLUCOSE, CAPILLARY
Glucose-Capillary: 156 mg/dL — ABNORMAL HIGH (ref 70–99)
Glucose-Capillary: 144 mg/dL — ABNORMAL HIGH (ref 70–99)
Glucose-Capillary: 144 mg/dL — ABNORMAL HIGH (ref 70–99)
Glucose-Capillary: 200 mg/dL — ABNORMAL HIGH (ref 70–99)
Glucose-Capillary: 183 mg/dL — ABNORMAL HIGH (ref 70–99)
Glucose-Capillary: 168 mg/dL — ABNORMAL HIGH (ref 70–99)

## 2020-01-08 LAB — LACTIC ACID, PLASMA: Lactic Acid, Venous: 1.2 mmol/L (ref 0.5–1.9)

## 2020-01-08 LAB — CBC WITH DIFFERENTIAL/PLATELET
Abs Immature Granulocytes: 0.16 10*3/uL — ABNORMAL HIGH (ref 0.00–0.07)
Basophils Absolute: 0.3 10*3/uL — ABNORMAL HIGH (ref 0.0–0.1)
Basophils Relative: 2 %
Eosinophils Absolute: 0.4 10*3/uL (ref 0.0–0.5)
Eosinophils Relative: 3 %
HCT: 51.3 % (ref 39.0–52.0)
Hemoglobin: 15.9 g/dL (ref 13.0–17.0)
Immature Granulocytes: 1 %
Lymphocytes Relative: 9 %
Lymphs Abs: 1.6 10*3/uL (ref 0.7–4.0)
MCH: 20.9 pg — ABNORMAL LOW (ref 26.0–34.0)
MCHC: 31 g/dL (ref 30.0–36.0)
MCV: 67.3 fL — ABNORMAL LOW (ref 80.0–100.0)
Monocytes Absolute: 2.1 10*3/uL — ABNORMAL HIGH (ref 0.1–1.0)
Monocytes Relative: 13 %
Neutro Abs: 12.2 10*3/uL — ABNORMAL HIGH (ref 1.7–7.7)
Neutrophils Relative %: 72 %
Platelets: 818 10*3/uL — ABNORMAL HIGH (ref 150–400)
RBC: 7.62 MIL/uL — ABNORMAL HIGH (ref 4.22–5.81)
RDW: 25.8 % — ABNORMAL HIGH (ref 11.5–15.5)
WBC: 16.7 10*3/uL — ABNORMAL HIGH (ref 4.0–10.5)
nRBC: 0 % (ref 0.0–0.2)

## 2020-01-08 LAB — BASIC METABOLIC PANEL
Anion gap: 11 (ref 5–15)
BUN: 25 mg/dL — ABNORMAL HIGH (ref 8–23)
CO2: 19 mmol/L — ABNORMAL LOW (ref 22–32)
Calcium: 7.8 mg/dL — ABNORMAL LOW (ref 8.9–10.3)
Chloride: 109 mmol/L (ref 98–111)
Creatinine, Ser: 0.91 mg/dL (ref 0.61–1.24)
GFR, Estimated: >60 mL/min (ref >60)
Glucose, Bld: 129 mg/dL — ABNORMAL HIGH (ref 70–99)
Potassium: 4.4 mmol/L (ref 3.5–5.1)
Sodium: 139 mmol/L (ref 135–145)

## 2020-01-08 LAB — PHOSPHORUS
Phosphorus: 4.4 mg/dL (ref 2.5–4.6)
Phosphorus: 3.1 mg/dL (ref 2.5–4.6)

## 2020-01-08 LAB — MAGNESIUM
Magnesium: 2.8 mg/dL — ABNORMAL HIGH (ref 1.7–2.4)
Magnesium: 2.3 mg/dL (ref 1.7–2.4)

## 2020-01-08 LAB — PROCALCITONIN: Procalcitonin: 4.79 ng/mL

## 2020-01-08 MED ORDER — INSULIN ASPART 100 UNIT/ML ~~LOC~~ SOLN
0.0000 [IU] | SUBCUTANEOUS | Status: DC
  Administered 2020-01-08 (×2): 2 [IU] via SUBCUTANEOUS
  Administered 2020-01-09: 3 [IU] via SUBCUTANEOUS
  Administered 2020-01-09: 2 [IU] via SUBCUTANEOUS
  Administered 2020-01-09: 3 [IU] via SUBCUTANEOUS
  Administered 2020-01-09: 1 [IU] via SUBCUTANEOUS
  Administered 2020-01-10 (×2): 2 [IU] via SUBCUTANEOUS
  Administered 2020-01-10: 3 [IU] via SUBCUTANEOUS
  Administered 2020-01-10 (×2): 2 [IU] via SUBCUTANEOUS
  Administered 2020-01-10: 1 [IU] via SUBCUTANEOUS
  Administered 2020-01-11 (×2): 2 [IU] via SUBCUTANEOUS
  Administered 2020-01-11 (×2): 3 [IU] via SUBCUTANEOUS
  Administered 2020-01-11 – 2020-01-12 (×3): 2 [IU] via SUBCUTANEOUS
  Administered 2020-01-12: 1 [IU] via SUBCUTANEOUS
  Administered 2020-01-12: 2 [IU] via SUBCUTANEOUS
  Administered 2020-01-12: 1 [IU] via SUBCUTANEOUS
  Administered 2020-01-12 – 2020-01-13 (×2): 2 [IU] via SUBCUTANEOUS
  Administered 2020-01-13: 1 [IU] via SUBCUTANEOUS
  Administered 2020-01-13 (×3): 2 [IU] via SUBCUTANEOUS
  Administered 2020-01-13: 3 [IU] via SUBCUTANEOUS
  Administered 2020-01-14: 2 [IU] via SUBCUTANEOUS
  Administered 2020-01-14: 1 [IU] via SUBCUTANEOUS
  Administered 2020-01-14 (×2): 2 [IU] via SUBCUTANEOUS
  Filled 2020-01-08 (×34): qty 1

## 2020-01-08 MED ORDER — VECURONIUM BROMIDE 10 MG IV SOLR
INTRAVENOUS | Status: AC
  Filled 2020-01-08: qty 10

## 2020-01-08 NOTE — Progress Notes (Signed)
CRITICAL CARE NOTE  69 y.o.malewho presents to the ED for evaluation ofchest pain or shortness of breath. Dx of COVID 11/2019 Diagnosis of with severe systolic CHF EF 30%  -history of HTN, DM, GERD, tobacco abuse, CAD s/p stenting, AVR with bioprosthetic valve, EF of20%. Paroxysmal A. fib on Eliquis. Covid + 11/30/2019.  Patient presents to the ED in respiratory distress complaining of chest pain and shortness of breath. He is reporting orthopnea and increased swelling. History is limited due to acuity of condition and patient's respiratory status. Patient was placed on biPAP, patient progressive worsened and was emergently intubated  Patient has severe metabolic acidosis +MARIJUANA  SIGNIFICANT EVENTS 1154 EKG with concerning ischemic changes, though difficult to tell if this is rate related. First trop is slightly elevated. Working on access to provide diltiazem, then will repeat ekg  [DS]  1210 Diltiazem provided and repeat EKGs demonstrate inferior ischemic changes. When I compare this to his admission last month looks similar, though slightly worse degree of elevations. He reports improving symptoms. Aspirin provided. I paged the STEMI provider on call  [DS]  1222 Spoke with Dr. Fletcher Anon, STEMI cardiologist on-call, who is reviewed EKGs and agrees that while EKG today is of worse magnitude with ischemic changes, it is not STEMI criteria at this time. He recommends management of CHF exacerbation with serial cardiac enzymes.   11/2 severe cardiogenic shock 11/2 remains on vent     CC  follow up respiratory failure  SUBJECTIVE Patient remains critically ill Prognosis is guarded   BP (!) 88/70   Pulse 76   Temp (!) 101.1 F (38.4 C)   Resp (!) 30   Ht 5' 7"  (1.702 m)   Wt 80.7 kg   SpO2 97%   BMI 27.86 kg/m    I/O last 3 completed shifts: In: 5221.2 [I.V.:3723.5; NG/GT:318.3; IV Piggyback:1179.5] Out: 1601 [Urine:3440] No intake/output data  recorded.  SpO2: 97 % FiO2 (%): 45 %  Estimated body mass index is 27.86 kg/m as calculated from the following:   Height as of this encounter: 5' 7"  (1.702 m).   Weight as of this encounter: 80.7 kg.  SIGNIFICANT EVENTS   REVIEW OF SYSTEMS  PATIENT IS UNABLE TO PROVIDE COMPLETE REVIEW OF SYSTEMS DUE TO SEVERE CRITICAL ILLNESS    PHYSICAL EXAMINATION:  GENERAL:critically ill appearing, +resp distress HEAD: Normocephalic, atraumatic.  EYES: Pupils equal, round, reactive to light.  No scleral icterus.  MOUTH: Moist mucosal membrane. NECK: Supple.  PULMONARY: +rhonchi, +wheezing CARDIOVASCULAR: S1 and S2. Regular rate and rhythm. No murmurs, rubs, or gallops.  GASTROINTESTINAL: Soft, nontender, -distended.  Positive bowel sounds.   MUSCULOSKELETAL: No swelling, clubbing, or edema.  NEUROLOGIC: obtunded, GCS<8 SKIN:intact,warm,dry  MEDICATIONS: I have reviewed all medications and confirmed regimen as documented   CULTURE RESULTS   Recent Results (from the past 240 hour(s))  Blood culture (routine x 2)     Status: None (Preliminary result)   Collection Time: 01/06/20  1:13 PM   Specimen: BLOOD  Result Value Ref Range Status   Specimen Description BLOOD BLOOD LEFT HAND  Final   Special Requests   Final    BOTTLES DRAWN AEROBIC AND ANAEROBIC Blood Culture adequate volume   Culture   Final    NO GROWTH 2 DAYS Performed at [facility], [address], [place], [place] [address]    Report Status PENDING  Incomplete  Blood culture (routine x 2)     Status: None (Preliminary result)   Collection Time: 01/06/20  1:26 PM   Specimen: BLOOD  Result Value Ref Range Status   Specimen Description BLOOD LEFT ANTECUBITAL  Final   Special Requests   Final    BOTTLES DRAWN AEROBIC AND ANAEROBIC Blood Culture adequate volume   Culture   Final    NO GROWTH 2 DAYS Performed at [facility], [address], [place], [place] [address]    Report Status PENDING   Incomplete  MRSA PCR Screening     Status: None   Collection Time: 01/06/20  2:43 PM   Specimen: Nasal Mucosa; Nasopharyngeal  Result Value Ref Range Status   MRSA by PCR NEGATIVE NEGATIVE Final    Comment:        The GeneXpert MRSA Assay (FDA approved for NASAL specimens only), is one component of a comprehensive MRSA colonization surveillance program. It is not intended to diagnose MRSA infection nor to guide or monitor treatment for MRSA infections. Performed at [facility], [address] [place], [place] [address]           IMAGING    [place] EKG SITE RITE  Result Date: 01/07/2020 If [facility] image not attached, placement could not be confirmed due to current cardiac rhythm.    Nutrition Status: Nutrition Problem: Moderate Malnutrition Etiology: chronic illness (CHF) Signs/Symptoms: moderate fat depletion, moderate muscle depletion Interventions: Tube feeding, MVI     Indwelling Urinary Catheter continued, requirement due to   Reason to continue Indwelling Urinary Catheter strict Intake/Output monitoring for hemodynamic instability   Central Line/ continued, requirement due to  Reason to continue [place] of central venous pressure or other hemodynamic parameters and poor IV access   Ventilator continued, requirement due to severe respiratory failure   Ventilator Sedation RASS 0 to -2      ASSESSMENT AND PLAN SYNOPSIS 69 yo [name] with severe resp failure due to severe metabolic acidosis with severe sCHF exacerbation previous h/o COVID in setting of drug abuse  Severe ACUTE Hypoxic and Hypercapnic Respiratory Failure -continue Full MV support -continue Bronchodilator Therapy -Wean Fio2 and PEEP as tolerated -will perform SAT/SBT when respiratory parameters are met -VAP/VENT bundle implementation  ACUTE SYSTOLIC CARDIAC FAILURE- EF 20% -oxygen as needed -Lasix as tolerated -follow up cardiac enzymes as indicated -follow up  cardiology recs   ACUTE KIDNEY INJURY/Renal Failure -continue Foley Catheter-assess need -Avoid nephrotoxic agents -Follow urine output, BMP -Ensure adequate renal perfusion, optimize oxygenation -Renal dose medications     NEUROLOGY - intubated and sedated - minimal sedation to achieve a RASS goal: -1 Wake up assessment pending  SHOCK-CARDIOGENIC -use vasopressors to keep MAP>65 -follow ABG and LA -follow up cultures -emperic ABX -consider stress dose steroids -aggressive IV fluid resuscitation  CARDIAC ICU monitoring  ID -continue IV abx as prescibed -follow up cultures PROCAL ELEVATED, STARTED CAP COVERAGE ABX  GI GI PROPHYLAXIS as indicated  NUTRITIONAL STATUS Nutrition Status: Nutrition Problem: Moderate Malnutrition Etiology: chronic illness (CHF) Signs/Symptoms: moderate fat depletion, moderate muscle depletion Interventions: Tube feeding, MVI   DIET-->TF's as tolerated Constipation protocol as indicated  ENDO - will use ICU hypoglycemic\Hyperglycemia protocol if indicated     ELECTROLYTES -follow labs as needed -replace as needed -pharmacy consultation and following   DVT/GI PRX ordered and assessed TRANSFUSIONS AS NEEDED MONITOR FSBS I Assessed the need for Labs I Assessed the need for Foley I Assessed the need for Central Venous Line Family Discussion when available I Assessed the need for Mobilization I made an Assessment of medications to be  adjusted accordingly Safety Risk assessment completed   CASE DISCUSSED IN MULTIDISCIPLINARY ROUNDS WITH ICU TEAM  Critical Care Time devoted to patient care services described in this note is 48 minutes.   Overall, patient is critically ill, prognosis is guarded.  Patient with Multiorgan failure and at high risk for cardiac arrest and death.    Corrin Parker, M.D.  [facility]  Medical Director [place] Director [facility] Cardio-Pulmonary  Department

## 2020-01-09 ENCOUNTER — Inpatient Hospital Stay: Payer: Medicare HMO

## 2020-01-09 ENCOUNTER — Other Ambulatory Visit: Payer: Self-pay

## 2020-01-09 DIAGNOSIS — J9601 Acute respiratory failure with hypoxia: Secondary | ICD-10-CM | POA: Diagnosis not present

## 2020-01-09 DIAGNOSIS — E440 Moderate protein-calorie malnutrition: Secondary | ICD-10-CM | POA: Diagnosis not present

## 2020-01-09 LAB — CBC WITH DIFFERENTIAL/PLATELET
Abs Immature Granulocytes: 0.14 10*3/uL — ABNORMAL HIGH (ref 0.00–0.07)
Basophils Absolute: 0.2 10*3/uL — ABNORMAL HIGH (ref 0.0–0.1)
Basophils Relative: 1 %
Eosinophils Absolute: 0.6 10*3/uL — ABNORMAL HIGH (ref 0.0–0.5)
Eosinophils Relative: 3 %
HCT: 50.4 % (ref 39.0–52.0)
Hemoglobin: 15.3 g/dL (ref 13.0–17.0)
Immature Granulocytes: 1 %
Lymphocytes Relative: 8 %
Lymphs Abs: 1.5 10*3/uL (ref 0.7–4.0)
MCH: 20.8 pg — ABNORMAL LOW (ref 26.0–34.0)
MCHC: 30.4 g/dL (ref 30.0–36.0)
MCV: 68.7 fL — ABNORMAL LOW (ref 80.0–100.0)
Monocytes Absolute: 2.6 10*3/uL — ABNORMAL HIGH (ref 0.1–1.0)
Monocytes Relative: 14 %
Neutro Abs: 13.5 10*3/uL — ABNORMAL HIGH (ref 1.7–7.7)
Neutrophils Relative %: 73 %
Platelets: 815 10*3/uL — ABNORMAL HIGH (ref 150–400)
RBC: 7.34 MIL/uL — ABNORMAL HIGH (ref 4.22–5.81)
RDW: 25.8 % — ABNORMAL HIGH (ref 11.5–15.5)
Smear Review: NORMAL
WBC: 18.4 10*3/uL — ABNORMAL HIGH (ref 4.0–10.5)
nRBC: 0 % (ref 0.0–0.2)

## 2020-01-09 LAB — GLUCOSE, CAPILLARY
Glucose-Capillary: 109 mg/dL — ABNORMAL HIGH (ref 70–99)
Glucose-Capillary: 119 mg/dL — ABNORMAL HIGH (ref 70–99)
Glucose-Capillary: 142 mg/dL — ABNORMAL HIGH (ref 70–99)
Glucose-Capillary: 190 mg/dL — ABNORMAL HIGH (ref 70–99)
Glucose-Capillary: 204 mg/dL — ABNORMAL HIGH (ref 70–99)
Glucose-Capillary: 205 mg/dL — ABNORMAL HIGH (ref 70–99)

## 2020-01-09 LAB — BASIC METABOLIC PANEL
Anion gap: 9 (ref 5–15)
BUN: 34 mg/dL — ABNORMAL HIGH (ref 8–23)
CO2: 23 mmol/L (ref 22–32)
Calcium: 7.8 mg/dL — ABNORMAL LOW (ref 8.9–10.3)
Chloride: 109 mmol/L (ref 98–111)
Creatinine, Ser: 1.04 mg/dL (ref 0.61–1.24)
GFR, Estimated: >60 mL/min (ref >60)
Glucose, Bld: 164 mg/dL — ABNORMAL HIGH (ref 70–99)
Potassium: 4.3 mmol/L (ref 3.5–5.1)
Sodium: 141 mmol/L (ref 135–145)

## 2020-01-09 LAB — PROCALCITONIN: Procalcitonin: 3.51 ng/mL

## 2020-01-09 LAB — MAGNESIUM: Magnesium: 2.1 mg/dL (ref 1.7–2.4)

## 2020-01-09 LAB — TROPONIN I (HIGH SENSITIVITY): Troponin I (High Sensitivity): 89 ng/L — ABNORMAL HIGH (ref <18)

## 2020-01-09 LAB — PHOSPHORUS: Phosphorus: 3.2 mg/dL (ref 2.5–4.6)

## 2020-01-09 MED ORDER — MIDAZOLAM HCL 2 MG/2ML IJ SOLN
4.0000 mg | Freq: Once | INTRAMUSCULAR | Status: AC
  Administered 2020-01-09: 4 mg via INTRAVENOUS
  Filled 2020-01-09: qty 4

## 2020-01-09 NOTE — Consult Note (Signed)
CARDIOLOGY CONSULT NOTE               Patient ID: Travis Maxwell MRN: [id number] DOB/AGE: Feb 01, 1951 69 y.o.  Admit date: 01/06/2020 Referring Physician Kasa Primary Physician [facility] Primary Cardiologist Gwen Pounds Reason for Consultation chest pain  HPI: 70 year old gentleman referred for evaluation of chest pain. The patient has a history of coronary artery disease status post DES to mid RCA in 07/2017, chronic systolic CHF with LVEF <20%, aortic insufficieny and stenosis, status post aortic valve replacement with [facility] tissue 03/15/2018, paroxysmal atrial fibrillation on Eliquis and home amiodarone, type II diabetes, hypertension, hyperlipidemia, and tobacco and marijuana use. Upon arrival to the ER on 01/06/2020, ECG revealed sinus rhythm at a rate of 99 bpm with RBBB and PVCs with ST elevation in II, III, avF, V4-V6. Dr. Kirke Corin was called who is on STEMI call, and stated that ECG did not meet STEMI criteria, and to manage CHF. Patient was placed on BiPAP, but due to progressively worsening dyspnea, the patient was emergently intubated. Per ER physician note, after patient received IV diltiazem, patient reported improvement of symptoms.  Labs notable for high sensitivity troponin 71->91->89 (191 one month prior). BNP 1046, creatinine 1.26, BUN 21, potassium 5.4. Chest xray showed improved pulmonary edema since prior study with chronic pulmonary venous hypertension. Of note, the patient was admitted to [facility] ER 12/04/2019 for acute respiratory failure and acute on chronic systolic CHF in the setting of COVID pneumonia. 2D echocardiogram 08/2019 showed severely reduced LV function with LVEF <20% with moderate to severe mitral regurgitation and mild to moderate tricuspid regurgitation. The patient is currently intubated and sedated and unable to provide a history.  Review of systems complete and found to be negative unless listed above     Past Medical History:  Diagnosis Date  . CAD  (coronary artery disease)   . Chronic systolic (congestive) heart failure (HCC)   . Diabetes mellitus without complication (HCC)   . GERD (gastroesophageal reflux disease)   . Gout   . Hx of aortic valve replacement    Bioprosthetic valve  . Hypertension     Past Surgical History:  Procedure Laterality Date  . CORONARY STENT INTERVENTION N/A 07/25/2017   Procedure: CORONARY STENT INTERVENTION;  Surgeon: Alwyn Pea, MD;  Location: [facility] INVASIVE CV LAB;  Service: Cardiovascular;  Laterality: N/A;  . LEFT HEART CATH AND CORONARY ANGIOGRAPHY N/A 07/25/2017   Procedure: LEFT HEART CATH AND CORONARY ANGIOGRAPHY;  Surgeon: Lamar Blinks, MD;  Location: [facility] INVASIVE CV LAB;  Service: Cardiovascular;  Laterality: N/A;    Medications Prior to Admission  Medication Sig Dispense Refill Last Dose  . albuterol (VENTOLIN HFA) 108 (90 Base) MCG/ACT inhaler Inhale 2 puffs into the lungs every 4 (four) hours as needed for wheezing or shortness of breath. 1 each 0 Unknown at PRN  . allopurinol (ZYLOPRIM) 300 MG tablet Take 300 mg by mouth daily.   8-24 hours at Unknown  . apixaban (ELIQUIS) 5 MG TABS tablet Take 1 tablet (5 mg total) by mouth 2 (two) times daily. 60 tablet 0 8-24 hours at Unknown  . aspirin EC 81 MG EC tablet Take 1 tablet (81 mg total) by mouth daily. 30 tablet 0   . colchicine 0.6 MG tablet Take 2 tabs PO x 1, then 1 tab PO 1 hour later x 1 Max: 1.8 mg total dose per attack, do not repeat within 3 days. 10 tablet 0 Unknown at PRN  . metoprolol succinate (  TOPROL-XL) 25 MG 24 hr tablet Take 25 mg by mouth daily.   8-24 hours at Unknown  . nitroGLYCERIN (NITROSTAT) 0.4 MG SL tablet Place 1 tablet (0.4 mg total) under the tongue every 5 (five) minutes as needed for chest pain. 30 tablet 12 Unknown at PRN  . sitaGLIPtin (JANUVIA) 100 MG tablet Take 100 mg by mouth daily.   8-24 hours at Unknown   Social History   Socioeconomic History  . Marital status: Divorced    Spouse name:  Not on file  . Number of children: Not on file  . Years of education: Not on file  . Highest education level: Not on file  Occupational History  . Not on file  Tobacco Use  . Smoking status: Current Every Day Smoker    Packs/day: 0.50    Types: Cigarettes    Last attempt to quit: 08/10/2017    Years since quitting: 2.4  . Smokeless tobacco: Never Used  Vaping Use  . Vaping Use: Never used  Substance and Sexual Activity  . Alcohol use: Not Currently  . Drug use: Yes    Types: Marijuana    Comment: today  . Sexual activity: Not on file  Other Topics Concern  . Not on file  Social History Narrative  . Not on file   Social Determinants of Health   Financial Resource Strain:   . Difficulty of Paying Living Expenses: Not on file  Food Insecurity:   . Worried About Programme researcher, broadcasting/film/video in the Last Year: Not on file  . Ran Out of Food in the Last Year: Not on file  Transportation Needs:   . Lack of Transportation (Medical): Not on file  . Lack of Transportation (Non-Medical): Not on file  Physical Activity:   . Days of Exercise per Week: Not on file  . Minutes of Exercise per Session: Not on file  Stress:   . Feeling of Stress : Not on file  Social Connections:   . Frequency of Communication with Friends and Family: Not on file  . Frequency of Social Gatherings with Friends and Family: Not on file  . Attends Religious Services: Not on file  . Active Member of Clubs or Organizations: Not on file  . Attends Banker Meetings: Not on file  . Marital Status: Not on file  Intimate Partner Violence:   . Fear of Current or Ex-Partner: Not on file  . Emotionally Abused: Not on file  . Physically Abused: Not on file  . Sexually Abused: Not on file    Family History  Problem Relation Age of Onset  . CAD Mother   . CAD Father       Review of systems complete and found to be negative unless listed above      PHYSICAL EXAM  General: Critically ill appearing  gentleman lying in bed, sedated and mechanically ventilated HEENT:  Normocephalic and atramatic Neck:  No JVD.  Lungs: mechanical ventilator support, no wheezing Heart: HRRR . 2/6 systolic murmur Abdomen: nondistended Extremities: No clubbing, cyanosis or edema.   Neuro: sedated   Labs:   Lab Results  Component Value Date   WBC 18.4 (H) 01/09/2020   HGB 15.3 01/09/2020   HCT 50.4 01/09/2020   MCV 68.7 (L) 01/09/2020   PLT 815 (H) 01/09/2020    Recent Labs  Lab 01/09/20 0152  NA 141  K 4.3  CL 109  CO2 23  BUN 34*  CREATININE 1.04  CALCIUM 7.8*  GLUCOSE 164*   Lab Results  Component Value Date   TROPONINI 0.04 (HH) 08/14/2017    Lab Results  Component Value Date   CHOL 106 12/01/2019   CHOL 155 08/14/2019   CHOL 149 07/24/2017   Lab Results  Component Value Date   HDL 42 12/01/2019   HDL 35 (L) 08/14/2019   HDL 40 (L) 07/24/2017   Lab Results  Component Value Date   LDLCALC 57 12/01/2019   LDLCALC 104 (H) 08/14/2019   LDLCALC 98 07/24/2017   Lab Results  Component Value Date   TRIG 34 12/01/2019   TRIG 34 12/01/2019   TRIG 78 08/14/2019   Lab Results  Component Value Date   CHOLHDL 2.5 12/01/2019   CHOLHDL 4.4 08/14/2019   CHOLHDL 3.7 07/24/2017   No results found for: LDLDIRECT    Radiology: DG Chest 1 View  Result Date: 01/09/2020 CLINICAL DATA:  PICC placement. EXAM: CHEST  1 VIEW COMPARISON:  Earlier today. FINDINGS: The right PICC has been repositioned with its tip at the superior cavoatrial junction. Endotracheal tube in satisfactory position. Stable median sternotomy wires and prosthetic heart valve. Stable enlarged heart. Interval mild increase in linear atelectasis at the left lung base. Stable mild diffuse peribronchial thickening. Unremarkable bones. IMPRESSION: 1. Satisfactory position of the right PICC with its tip at the superior cavoatrial junction. 2. Mildly increased left basilar atelectasis. 3. Stable mild chronic bronchitic  changes. 4. Stable cardiomegaly. Electronically Signed   By: Beckie Salts M.D.   On: 01/09/2020 10:56   DG Chest 1 View  Result Date: 01/06/2020 CLINICAL DATA:  Post intubation EXAM: CHEST  1 VIEW COMPARISON:  01/06/2020 at 0833 hours FINDINGS: Endotracheal tube terminates 3.5 cm above the carina. Mild increased perihilar markings, favoring mild interstitial edema. Mild left basilar opacity, possibly atelectasis. Trace left pleural effusion. No pneumothorax. Cardiomegaly.  Prosthetic valve. Enteric tube terminates in the proximal gastric body. 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 IMPRESSION: Endotracheal tube terminates 3.5 cm above the carina. Suspected mild interstitial edema with trace left pleural effusion. Mild left basilar opacity, likely atelectasis. Electronically Signed   By: Charline Bills M.D.   On: 01/06/2020 14:16   DG Chest 2 View  Result Date: 01/06/2020 CLINICAL DATA:  Chest pain and shortness of breath. EXAM: CHEST - 2 VIEW COMPARISON:  11/30/2019 FINDINGS: Stable cardiac enlargement and prior aortic valve replacement. Lungs demonstrate decreased pulmonary edema since the prior study. There is a chronic component of probable underlying pulmonary venous hypertension. No overt airspace edema or pleural fluid. No pneumothorax, airspace consolidation or nodule identified. Bony structures are unremarkable. IMPRESSION: Improved pulmonary edema since the prior study. Chronic pulmonary venous hypertension present. Electronically Signed   By: Irish Lack M.D.   On: 01/06/2020 08:42   DG Abdomen 1 View  Result Date: 01/06/2020 CLINICAL DATA:  NG tube placement EXAM: ABDOMEN - 1 VIEW COMPARISON:  None. FINDINGS: Enteric tube terminates in the proximal gastric body. Nonobstructive bowel gas pattern. Lung bases are clear. Cardiomegaly.  Prosthetic valve. IMPRESSION: Enteric tube terminates in the proximal gastric body. Electronically Signed   By: Charline Bills M.D.    On: 01/06/2020 14:15   DG Chest Port 1 View  Result Date: 01/09/2020 CLINICAL DATA:  Shortness of breath. EXAM: PORTABLE CHEST 1 VIEW COMPARISON:  01/08/2020 FINDINGS: 0807 hours. Low lung volumes. The cardio pericardial silhouette is enlarged. Endotracheal tube tip is 5.5 cm above the base of the carina. Right PICC line tip has  been repositioned in the interval, likely in the azygos vein. Vascular congestion again noted with underlying chronic interstitial changes. Retrocardiac atelectasis/infiltrate is similar to prior. Telemetry leads overlie the chest. IMPRESSION: 1. Right PICC line tip appears to have flipped up into the azygos vein. 2. Otherwise stable exam. 3. Low volume film with persistent retrocardiac atelectasis/infiltrate. These results will be called to the ordering clinician or representative by the Radiologist Assistant, and communication documented in the PACS or [employer]. Electronically Signed   By: Kennith Center M.D.   On: 01/09/2020 09:06   DG Chest Port 1 View  Result Date: 01/08/2020 CLINICAL DATA:  Shortness of breath, intubation EXAM: PORTABLE CHEST 1 VIEW COMPARISON:  Portable exam 0451 hours compared to 01/06/2020 FINDINGS: Tip of endotracheal tube projects 5.2 cm above carina. Nasogastric tube extends into stomach. RIGHT arm PICC line tip projects over SVC. Numerous EKG leads project over chest. Enlargement of cardiac silhouette post AVR with pulmonary vascular congestion. Diffuse mild BILATERAL pulmonary infiltrates, favor pulmonary edema. Probable small LEFT pleural effusion. No pneumothorax. IMPRESSION: Probable mild pulmonary edema and small LEFT pleural effusion. Electronically Signed   By: Ulyses Southward M.D.   On: 01/08/2020 07:51   [place] EKG SITE RITE  Result Date: 01/07/2020 If Site Rite image not attached, placement could not be confirmed due to current cardiac rhythm.   Telemetry: sinus rhythm, PVCs  ASSESSMENT AND PLAN:  70 year old male with a history of  HFrEF, coronary artery disease status post DES to mid RCA 2019, aortic stenosis, status post bioprosthetic AVR 03/2018, paroxysmal atrial fibrillation, tobacco and marijuana use, type II diabetes, and hypertension who presented to [facility] ER on 01/06/2020 complaining of chest pain and shortness of breath in acute respiratory failure; symptoms reportedly improved when rate improved after diltiazem injection. ECG revealed ischemic changes, though did not meet STEMI criteria. The patient is currently intubated and sedated.  1. Acute on chronic systolic CHF, with LVEF <20%, currently on vasopressor, and IV Lasix BID. BNP 1046. Chest xray with improved pulmonary edema since prior study with chronic pulmonary venous hypertension present. Questionable medical non-adherence. 2. Coronary artery disease, status post NSTEMI 07/2017, status post DES to mid RCA; patient presented with chest pain. Troponin borderline elevated 71->91->89 (191 one month prior). 3. Paroxsymal atrial fibrillation, on amiodarone at home, and Eliquis for stroke prevention. Currently in sinus rhythm of amiodarone. 4. Frequent premature ventricular contractions 5. Aortic stenosis, status post Edwards magna tissue valve in 03/2018. Most recent echocardiogram 08/2019 shows mild aortic regurgitation 6. Tobacco and marijuana use  Recommendations: 1. Defer ischemic work-up at this time as patient is currently intubated, troponin borderline elevated, and ECG without evidence of STEMI 2. Continue IV Lasix as needed 3. Continue Eliquis for stroke prevention 4. Defer resuming amiodarone at this time; recommend beta blocker when blood pressure permits. 5. Continue to monitor on telemetry 6. Recommend beta blocker, Entresto, and diuretic at discharge if blood pressure permits  Discussed with Dr. Darrold Junker who agrees with above plan.     Signed: Leanora Ivanoff PA-C 01/09/2020, 12:18 PM

## 2020-01-09 NOTE — Progress Notes (Signed)
CRITICAL CARE NOTE  69 y.o.malewho presents to the ED for evaluation ofchest pain or shortness of breath. Dx of COVID 11/2019 Diagnosis of with severe systolic CHF EF 14%  -history of HTN, DM, GERD, tobacco abuse, CAD s/p stenting, AVR with bioprosthetic valve, EF of20%. Paroxysmal A. fib on Eliquis. Covid + 11/30/2019.  Patient presents to the ED in respiratory distress complaining of chest pain and shortness of breath. He is reporting orthopnea and increased swelling. History is limited due to acuity of condition and patient's respiratory status. Patient was placed on biPAP, patient progressive worsened and was emergently intubated  Patient has severe metabolic acidosis +MARIJUANA  SIGNIFICANT EVENTS 1154 EKG with concerning ischemic changes, though difficult to tell if this is rate related. First trop is slightly elevated. Working on access to provide diltiazem, then will repeat ekg  [DS]  1210 Diltiazem provided and repeat EKGs demonstrate inferior ischemic changes. When I compare this to his admission last month looks similar, though slightly worse degree of elevations. He reports improving symptoms. Aspirin provided. I paged the STEMI provider on call  [DS]  1222 Spoke with Dr. Fletcher Anon, STEMI cardiologist on-call, who is reviewed EKGs and agrees that while EKG today is of worse magnitude with ischemic changes, it is not STEMI criteria at this time. He recommends management of CHF exacerbation with serial cardiac enzymes.   11/2 severe cardiogenic shock 11/2 remains on vent 11/3 severe resp failure 11/3 severe sCHF       CC  follow up respiratory failure  SUBJECTIVE Patient remains critically ill Prognosis is guarded +cardiogenic shokc Therapy for pneumonia   BP 118/83   Pulse 83   Temp 100.2 F (37.9 C)   Resp (!) 30   Ht 5' 7"  (1.702 m)   Wt 78.7 kg   SpO2 96%   BMI 27.17 kg/m    I/O last 3 completed shifts: In: 5010.8 [I.V.:1729.2;  NG/GT:1854.4; IV Piggyback:1427.2] Out: 2300 [Urine:2300] No intake/output data recorded.  SpO2: 96 % FiO2 (%): 36 %  Estimated body mass index is 27.17 kg/m as calculated from the following:   Height as of this encounter: 5' 7"  (1.702 m).   Weight as of this encounter: 78.7 kg.   REVIEW OF SYSTEMS  PATIENT IS UNABLE TO PROVIDE COMPLETE REVIEW OF SYSTEMS DUE TO SEVERE CRITICAL ILLNESS        PHYSICAL EXAMINATION:  GENERAL:critically ill appearing, +resp distress HEAD: Normocephalic, atraumatic.  EYES: Pupils equal, round, reactive to light.  No scleral icterus.  MOUTH: Moist mucosal membrane. NECK: Supple.  PULMONARY: +rhonchi, +wheezing CARDIOVASCULAR: S1 and S2. Regular rate and rhythm. No murmurs, rubs, or gallops.  GASTROINTESTINAL: Soft, nontender, -distended.  Positive bowel sounds.   MUSCULOSKELETAL: No swelling, clubbing, or edema.  NEUROLOGIC: obtunded, GCS<8 SKIN:intact,warm,dry  MEDICATIONS: I have reviewed all medications and confirmed regimen as documented   CULTURE RESULTS   Recent Results (from the past 240 hour(s))  Blood culture (routine x 2)     Status: None (Preliminary result)   Collection Time: 01/06/20  1:13 PM   Specimen: BLOOD  Result Value Ref Range Status   Specimen Description BLOOD BLOOD LEFT HAND  Final   Special Requests   Final    BOTTLES DRAWN AEROBIC AND ANAEROBIC Blood Culture adequate volume   Culture   Final    NO GROWTH 3 DAYS Performed at [facility], [address] [place], [place] [address]    Report Status PENDING  Incomplete  Blood culture (routine x 2)  Status: None (Preliminary result)   Collection Time: 01/06/20  1:26 PM   Specimen: BLOOD  Result Value Ref Range Status   Specimen Description BLOOD LEFT ANTECUBITAL  Final   Special Requests   Final    BOTTLES DRAWN AEROBIC AND ANAEROBIC Blood Culture adequate volume   Culture   Final    NO GROWTH 3 DAYS Performed at [facility], [address] [place], [place] [address]    Report Status PENDING  Incomplete  MRSA PCR Screening     Status: None   Collection Time: 01/06/20  2:43 PM   Specimen: Nasal Mucosa; Nasopharyngeal  Result Value Ref Range Status   MRSA by PCR NEGATIVE NEGATIVE Final    Comment:        The GeneXpert MRSA Assay (FDA approved for NASAL specimens only), is one component of a comprehensive MRSA colonization surveillance program. It is not intended to diagnose MRSA infection nor to guide or monitor treatment for MRSA infections. Performed at [facility], [address] [place], [place] [address]           IMAGING    No results found.   Nutrition Status: Nutrition Problem: Moderate Malnutrition Etiology: chronic illness (CHF) Signs/Symptoms: moderate fat depletion, moderate muscle depletion Interventions: Tube feeding, MVI     Indwelling Urinary Catheter continued, requirement due to   Reason to continue Indwelling Urinary Catheter strict Intake/Output monitoring for hemodynamic instability   Central Line/ continued, requirement due to  Reason to continue [place] of central venous pressure or other hemodynamic parameters and poor IV access   Ventilator continued, requirement due to severe respiratory failure   Ventilator Sedation RASS 0 to -2      ASSESSMENT AND PLAN SYNOPSIS 69 yo [name] with severe resp failure due to severe metabolic acidosis with severe sCHF exacerbation previous h/o COVID in setting of drug abuse, therapy for CAP   Severe ACUTE Hypoxic and Hypercapnic Respiratory Failure -continue Full MV support -continue Bronchodilator Therapy -Wean Fio2 and PEEP as tolerated -will perform SAT/SBT when respiratory parameters are met -VAP/VENT bundle implementation  ACUTE SYSTOLIC CARDIAC FAILURE- EF 20% -oxygen as needed -Lasix as tolerated -follow up cardiac enzymes as indicated -follow up cardiology recs  ACUTE KIDNEY  INJURY/Renal Failure -continue Foley Catheter-assess need -Avoid nephrotoxic agents -Follow urine output, BMP -Ensure adequate renal perfusion, optimize oxygenation -Renal dose medications    NEUROLOGY Acute toxic metabolic encephalopathy, need for sedation Goal RASS -2 to -3  SHOCK-CARDIOGENIC -use vasopressors to keep MAP>65   CARDIAC ICU monitoring  ID-therapy for CAP -continue IV abx as prescibed -follow up cultures  Anti-infectives (From admission, onward)   Start     Dose/Rate Route Frequency Ordered Stop   01/07/20 2330  azithromycin (ZITHROMAX) 500 mg in sodium chloride 0.9 % 250 mL IVPB        500 mg 250 mL/hr over 60 Minutes Intravenous Every 24 hours 01/07/20 2234     01/07/20 2330  cefTRIAXone (ROCEPHIN) 2 g in sodium chloride 0.9 % 100 mL IVPB        2 g 200 mL/hr over 30 Minutes Intravenous Every 24 hours 01/07/20 2234     01/06/20 2015  remdesivir 100 mg in sodium chloride 0.9 % 100 mL IVPB  Status:  Discontinued        100 mg 200 mL/hr over 30 Minutes Intravenous  Once 01/06/20 1921 01/06/20 1931   01/06/20 2015  remdesivir 100 mg in sodium chloride 0.9 % 100  mL IVPB  Status:  Discontinued        100 mg 200 mL/hr over 30 Minutes Intravenous  Once 01/06/20 1921 01/06/20 1931   01/06/20 2015  remdesivir 100 mg in sodium chloride 0.9 % 100 mL IVPB  Status:  Discontinued        100 mg 200 mL/hr over 30 Minutes Intravenous  Once 01/06/20 1921 01/06/20 1931       GI GI PROPHYLAXIS as indicated  NUTRITIONAL STATUS Nutrition Status: Nutrition Problem: Moderate Malnutrition Etiology: chronic illness (CHF) Signs/Symptoms: moderate fat depletion, moderate muscle depletion Interventions: Tube feeding, MVI   DIET-->TF's as tolerated Constipation protocol as indicated  ENDO - will use ICU hypoglycemic\Hyperglycemia protocol if indicated    ELECTROLYTES -follow labs as needed -replace as needed -pharmacy consultation and following   DVT/GI PRX  ordered and assessed TRANSFUSIONS AS NEEDED MONITOR FSBS I Assessed the need for Labs I Assessed the need for Foley I Assessed the need for Central Venous Line Family Discussion when available I Assessed the need for Mobilization I made an Assessment of medications to be adjusted accordingly Safety Risk assessment completed   CASE DISCUSSED IN MULTIDISCIPLINARY ROUNDS WITH ICU TEAM  Critical Care Time devoted to patient care services described in this note is 45 minutes.   Overall, patient is critically ill, prognosis is guarded.  Patient with Multiorgan failure and at high risk for cardiac arrest and death.    Corrin Parker, M.D.  [facility]  Medical Director [place] Director [facility] Cardio-Pulmonary Department

## 2020-01-09 NOTE — Progress Notes (Signed)
PICC was power flushed at this time to see if line would move back into proper placement. MD notified of attempt to fix PICC line, he gave the RN the okay to continue IV medications at this time. Education was given to the RN to keep the patient sitting up as high as possible and keep the patient turned to the right side for 1 hour. Chest X ray also recommended for proper placement 1 hour after power flush.

## 2020-01-09 NOTE — Progress Notes (Signed)
Dr. Belia Heman notified of patient being restless and agitated. Respirations in mid 30's and diaphoretic. Patient fentanyl restarted last night. Orders to give versed push and restart versed drip. Dr. Belia Heman also notified of patient PICC line. Orders to consult PICC team.

## 2020-01-09 NOTE — Hospital Course (Signed)
HTN, DM, GERD, tobacco abuse, CAD s/p stenting, AVR with bioprosthetic valve, EF of 20%.  Paroxysmal A. fib on Eliquis. Covid + 11/30/2019.  Recent discharge from the hospital December 02, 2019  presents to the ED in respiratory distress complaining of chest pain and shortness of breath.  Presenting with orthopnea and increased swelling Initially treated with BiPAP, agitated, pulling BiPAP off, respiratory distress worsen Intubated  Echocardiogram June 2021 ejection fraction less than 20% moderate to severe MR severe dilation of LV  Troponin 89 WBC 18

## 2020-01-10 DIAGNOSIS — J9600 Acute respiratory failure, unspecified whether with hypoxia or hypercapnia: Secondary | ICD-10-CM | POA: Diagnosis not present

## 2020-01-10 LAB — CBC WITH DIFFERENTIAL/PLATELET
Abs Immature Granulocytes: 0.1 10*3/uL — ABNORMAL HIGH (ref 0.00–0.07)
Basophils Absolute: 0.2 10*3/uL — ABNORMAL HIGH (ref 0.0–0.1)
Basophils Relative: 1 %
Eosinophils Absolute: 0.7 10*3/uL — ABNORMAL HIGH (ref 0.0–0.5)
Eosinophils Relative: 4 %
HCT: 49.1 % (ref 39.0–52.0)
Hemoglobin: 14.7 g/dL (ref 13.0–17.0)
Immature Granulocytes: 1 %
Lymphocytes Relative: 10 %
Lymphs Abs: 1.5 10*3/uL (ref 0.7–4.0)
MCH: 20.4 pg — ABNORMAL LOW (ref 26.0–34.0)
MCHC: 29.9 g/dL — ABNORMAL LOW (ref 30.0–36.0)
MCV: 68.3 fL — ABNORMAL LOW (ref 80.0–100.0)
Monocytes Absolute: 2 10*3/uL — ABNORMAL HIGH (ref 0.1–1.0)
Monocytes Relative: 13 %
Neutro Abs: 11.6 10*3/uL — ABNORMAL HIGH (ref 1.7–7.7)
Neutrophils Relative %: 71 %
Platelets: 802 10*3/uL — ABNORMAL HIGH (ref 150–400)
RBC: 7.19 MIL/uL — ABNORMAL HIGH (ref 4.22–5.81)
RDW: 25.8 % — ABNORMAL HIGH (ref 11.5–15.5)
Smear Review: NORMAL
WBC: 16.1 10*3/uL — ABNORMAL HIGH (ref 4.0–10.5)
nRBC: 0 % (ref 0.0–0.2)

## 2020-01-10 LAB — GLUCOSE, CAPILLARY
Glucose-Capillary: 133 mg/dL — ABNORMAL HIGH (ref 70–99)
Glucose-Capillary: 217 mg/dL — ABNORMAL HIGH (ref 70–99)
Glucose-Capillary: 158 mg/dL — ABNORMAL HIGH (ref 70–99)
Glucose-Capillary: 154 mg/dL — ABNORMAL HIGH (ref 70–99)
Glucose-Capillary: 180 mg/dL — ABNORMAL HIGH (ref 70–99)
Glucose-Capillary: 174 mg/dL — ABNORMAL HIGH (ref 70–99)

## 2020-01-10 LAB — BASIC METABOLIC PANEL
Anion gap: 8 (ref 5–15)
BUN: 41 mg/dL — ABNORMAL HIGH (ref 8–23)
CO2: 25 mmol/L (ref 22–32)
Calcium: 9 mg/dL (ref 8.9–10.3)
Chloride: 108 mmol/L (ref 98–111)
Creatinine, Ser: 1.12 mg/dL (ref 0.61–1.24)
GFR, Estimated: >60 mL/min (ref >60)
Glucose, Bld: 149 mg/dL — ABNORMAL HIGH (ref 70–99)
Potassium: 4.8 mmol/L (ref 3.5–5.1)
Sodium: 141 mmol/L (ref 135–145)

## 2020-01-10 MED ORDER — SENNOSIDES 8.8 MG/5ML PO SYRP
5.0000 mL | ORAL_SOLUTION | Freq: Two times a day (BID) | ORAL | Status: DC
  Administered 2020-01-10 – 2020-01-11 (×2): 5 mL
  Filled 2020-01-10 (×6): qty 5

## 2020-01-10 MED ORDER — NOREPINEPHRINE 16 MG/250ML-% IV SOLN
0.0000 ug/min | INTRAVENOUS | Status: DC
  Filled 2020-01-10: qty 250

## 2020-01-10 MED ORDER — ATORVASTATIN CALCIUM 20 MG PO TABS
40.0000 mg | ORAL_TABLET | Freq: Every day | ORAL | Status: DC
  Administered 2020-01-10: 40 mg
  Filled 2020-01-10: qty 2

## 2020-01-10 MED ORDER — SODIUM CHLORIDE 0.9 % IV SOLN
2.0000 g | Freq: Two times a day (BID) | INTRAVENOUS | Status: DC
  Administered 2020-01-10 – 2020-01-11 (×2): 2 g via INTRAVENOUS
  Filled 2020-01-10 (×3): qty 2

## 2020-01-10 MED ORDER — MIDAZOLAM HCL 2 MG/2ML IJ SOLN
1.0000 mg | INTRAMUSCULAR | Status: DC | PRN
  Administered 2020-01-10: 2 mg via INTRAVENOUS
  Administered 2020-01-10: 1 mg via INTRAVENOUS
  Administered 2020-01-11 (×3): 2 mg via INTRAVENOUS
  Administered 2020-01-12: 1 mg via INTRAVENOUS
  Administered 2020-01-12 – 2020-01-13 (×5): 2 mg via INTRAVENOUS
  Filled 2020-01-10 (×10): qty 2

## 2020-01-10 MED ORDER — NOREPINEPHRINE 4 MG/250ML-% IV SOLN: 0.0000 ug/min | INTRAVENOUS | Status: DC

## 2020-01-10 MED ORDER — POLYETHYLENE GLYCOL 3350 17 G PO PACK
17.0000 g | PACK | Freq: Every day | ORAL | Status: DC
  Administered 2020-01-11: 17 g
  Filled 2020-01-10: qty 1

## 2020-01-10 NOTE — Progress Notes (Signed)
Spoke with MD on pt's WUA, able to respond to painful stimulus, unable to follow commands at this point. Orders to decrease pt's Fentanyl to 54mcg/hr, will continue to assess.

## 2020-01-10 NOTE — Progress Notes (Signed)
[facility]    SUBJECTIVE: The patient is currently sedated and intubated and unable to provide history or answer ROS at this time.   Vitals:   01/10/20 0500 01/10/20 0600 01/10/20 0700 01/10/20 0800  BP: 96/81 103/78 91/75 106/78  Pulse: 76 77 76 73  Resp: (!) 30 (!) 30 (!) 30 (!) 30  Temp: (!) 100.6 F (38.1 C) (!) 100.6 F (38.1 C) (!) 100.9 F (38.3 C) (!) 100.6 F (38.1 C)  TempSrc: Core Core Bladder   SpO2: 97% 96% 91% 95%  Weight:  78 kg    Height:         Intake/Output Summary (Last 24 hours) at 01/10/2020 6834 Last data filed at 01/10/2020 0600 Gross per 24 hour  Intake 1719.64 ml  Output 1975 ml  Net -255.36 ml      PHYSICAL EXAM  General: Critically ill appearing gentleman lying in bed, sedated and mechanically ventilated HEENT:  Normocephalic and atramatic Neck:   No JVD.  Lungs: mechanical ventilator support, no wheezing Heart: HRRR . 2/6 systolic murmur Abdomen: nondistended Extremities: No clubbing, cyanosis or edema.   Neuro: sedated   LABS: Basic Metabolic Panel: Recent Labs    01/08/20 0500 01/08/20 1725 01/09/20 0152 01/10/20 0420  NA   < >  --  141 141  K   < >  --  4.3 4.8  CL   < >  --  109 108  CO2   < >  --  23 25  GLUCOSE   < >  --  164* 149*  BUN   < >  --  34* 41*  CREATININE   < >  --  1.04 1.12  CALCIUM   < >  --  7.8* 9.0  MG  --  2.3 2.1  --   PHOS  --  3.1 3.2  --    < > = values in this interval not displayed.   Liver Function Tests: No results for input(s): AST, ALT, ALKPHOS, BILITOT, PROT, ALBUMIN in the last 72 hours. No results for input(s): LIPASE, AMYLASE in the last 72 hours. CBC: Recent Labs    01/09/20 0152 01/10/20 0420  WBC 18.4* 16.1*  NEUTROABS 13.5* 11.6*  HGB 15.3 14.7  HCT 50.4 49.1  MCV 68.7* 68.3*  PLT 815* 802*   Cardiac Enzymes: No results for input(s): CKTOTAL, CKMB, CKMBINDEX, TROPONINI in the last 72 hours. BNP: Invalid input(s): POCBNP D-Dimer: No results for input(s): DDIMER  in the last 72 hours. Hemoglobin A1C: No results for input(s): HGBA1C in the last 72 hours. Fasting Lipid Panel: No results for input(s): CHOL, HDL, LDLCALC, TRIG, CHOLHDL, LDLDIRECT in the last 72 hours. Thyroid Function Tests: No results for input(s): TSH, T4TOTAL, T3FREE, THYROIDAB in the last 72 hours.  Invalid input(s): FREET3 Anemia Panel: No results for input(s): VITAMINB12, FOLATE, FERRITIN, TIBC, IRON, RETICCTPCT in the last 72 hours.  DG Chest 1 View  Result Date: 01/09/2020 CLINICAL DATA:  PICC placement. EXAM: CHEST  1 VIEW COMPARISON:  Earlier today. FINDINGS: The right PICC has been repositioned with its tip at the superior cavoatrial junction. Endotracheal tube in satisfactory position. Stable median sternotomy wires and prosthetic heart valve. Stable enlarged heart. Interval mild increase in linear atelectasis at the left lung base. Stable mild diffuse peribronchial thickening. Unremarkable bones. IMPRESSION: 1. Satisfactory position of the right PICC with its tip at the superior cavoatrial junction. 2. Mildly increased left basilar atelectasis. 3. Stable mild chronic bronchitic changes. 4. Stable  cardiomegaly. Electronically Signed   By: Beckie Salts M.D.   On: 01/09/2020 10:56   DG Chest Port 1 View  Result Date: 01/09/2020 CLINICAL DATA:  Shortness of breath. EXAM: PORTABLE CHEST 1 VIEW COMPARISON:  01/08/2020 FINDINGS: 0807 hours. Low lung volumes. The cardio pericardial silhouette is enlarged. Endotracheal tube tip is 5.5 cm above the base of the carina. Right PICC line tip has been repositioned in the interval, likely in the azygos vein. Vascular congestion again noted with underlying chronic interstitial changes. Retrocardiac atelectasis/infiltrate is similar to prior. Telemetry leads overlie the chest. IMPRESSION: 1. Right PICC line tip appears to have flipped up into the azygos vein. 2. Otherwise stable exam. 3. Low volume film with persistent retrocardiac  atelectasis/infiltrate. These results will be called to the ordering clinician or representative by the Radiologist Assistant, and communication documented in the PACS or [employer]. Electronically Signed   By: Kennith Center M.D.   On: 01/09/2020 09:06     Echo LVEF <20%  TELEMETRY: sinus rhythm, occasional ectopy  ASSESSMENT AND PLAN:  Active Problems:   Respiratory failure (HCC)   Malnutrition of moderate degree    1. Acute on chronic systolic CHF, with LVEF <20%, currently on vasopressor, and IV Lasix BID. BNP 1046. Chest xray with improved pulmonary edema since prior study with chronic pulmonary venous hypertension present. Questionable medication non-adherence. 2. Coronary artery disease, status post NSTEMI 07/2017, status post DES to mid RCA; patient presented with chest pain. Troponin borderline elevated 71->91->89 (191 one month prior). 3. Paroxsymal atrial fibrillation, on amiodarone at home, and Eliquis for stroke prevention. Currently in sinus rhythm of amiodarone. 4. Frequent premature ventricular contractions 5. Aortic stenosis, status post Edwards magna tissue valve in 03/2018. Most recent echocardiogram 08/2019 shows mild aortic regurgitation 6. Tobacco and marijuana use 7.  Leukocytosis, WBC 21.9-> 16.1, on antibiotics, febrile 100.6 F 8. Cardiomyopathy, LVEF <20% 9. Hypotension, which is multifactorial with possible septic shock, cardiogenic shock, and cardiomyopathy   Recommendations: 1. Defer ischemic work-up at this time as patient is currently intubated, troponin borderline elevated, and ECG without evidence of STEMI 2. Continue Lasix with careful monitoring of renal status and I&Os 3. Continue Eliquis for stroke prevention 4. Defer resuming amiodarone at this time; recommend beta blocker when blood pressure permits. 5. Continue to monitor on telemetry 6. Recommend beta blocker, Entresto, and diuretic at discharge if blood pressure permits 7. Consider  discussion of AICD as outpatient if appropriate   Discussed with Dr. Darrold Junker who agrees with above plan.    Leanora Ivanoff, PA-C 01/10/2020 8:23 AM

## 2020-01-10 NOTE — Progress Notes (Signed)
CRITICAL CARE NOTE    Dx of COVID 11/2019. Patient presents to the ED in respiratory distress complaining of chest pain and shortness of breath. He is reporting orthopnea and increased swelling. History is limited due to acuity of condition and patient's respiratory status. Patient was placed on biPAP, patient progressive worsened and was emergently intubated.  Patient has severe metabolic acidosis +MARIJUANA . Diagnosis of with severe systolic CHF EF 96%  -history of HTN, DM, GERD, tobacco abuse, CAD s/p stenting, AVR with bioprosthetic valve, EF of20%. Paroxysmal A. fib on Eliquis.  Covid + 11/30/2019   MICRO Recent Results (from the past 240 hour(s))  Blood culture (routine x 2)     Status: None (Preliminary result)   Collection Time: 01/06/20  1:13 PM   Specimen: BLOOD  Result Value Ref Range Status   Specimen Description BLOOD BLOOD LEFT HAND  Final   Special Requests   Final    BOTTLES DRAWN AEROBIC AND ANAEROBIC Blood Culture adequate volume   Culture   Final    NO GROWTH 4 DAYS Performed at [facility], [address] [place], [place] [address]    Report Status PENDING  Incomplete  Blood culture (routine x 2)     Status: None (Preliminary result)   Collection Time: 01/06/20  1:26 PM   Specimen: BLOOD  Result Value Ref Range Status   Specimen Description BLOOD LEFT ANTECUBITAL  Final   Special Requests   Final    BOTTLES DRAWN AEROBIC AND ANAEROBIC Blood Culture adequate volume   Culture   Final    NO GROWTH 4 DAYS Performed at [facility], [address], [place], [place] [address]    Report Status PENDING  Incomplete  MRSA PCR Screening     Status: None   Collection Time: 01/06/20  2:43 PM   Specimen: Nasal Mucosa; Nasopharyngeal  Result Value Ref Range Status   MRSA by PCR NEGATIVE NEGATIVE Final    Comment:        The GeneXpert MRSA Assay  (FDA approved for NASAL specimens only), is one component of a comprehensive MRSA colonization surveillance program. It is not intended to diagnose MRSA infection nor to guide or monitor treatment for MRSA infections. Performed at [facility], [address] [place], [place] [address]   Culture, respiratory (non-expectorated)     Status: None (Preliminary result)   Collection Time: 01/09/20  2:45 AM   Specimen: Tracheal Aspirate; Respiratory  Result Value Ref Range Status   Specimen Description   Final    TRACHEAL ASPIRATE Performed at [facility], [address] [place], [place] [address]    Special Requests   Final    NONE Performed at [facility], [address] [place], [place] [address]    Gram Stain   Final    MODERATE WBC PRESENT, PREDOMINANTLY PMN RARE GRAM POSITIVE COCCI    Culture   Final    CULTURE REINCUBATED FOR BETTER GROWTH Performed at [facility], [place] [address] [place],  [address]    Report Status PENDING  Incomplete     ANTIBIOTICS Anti-infectives (From admission, onward)   Start     Dose/Rate Route Frequency Ordered Stop   01/07/20 2330  azithromycin (ZITHROMAX) 500 mg in sodium chloride 0.9 % 250 mL IVPB        500 mg 250 mL/hr over 60 Minutes Intravenous Every 24 hours 01/07/20 2234     01/07/20 2330  cefTRIAXone (ROCEPHIN) 2  g in sodium chloride 0.9 % 100 mL IVPB        2 g 200 mL/hr over 30 Minutes Intravenous Every 24 hours 01/07/20 2234     01/06/20 2015  remdesivir 100 mg in sodium chloride 0.9 % 100 mL IVPB  Status:  Discontinued        100 mg 200 mL/hr over 30 Minutes Intravenous  Once 01/06/20 1921 01/06/20 1931   01/06/20 2015  remdesivir 100 mg in sodium chloride 0.9 % 100 mL IVPB  Status:  Discontinued        100 mg 200 mL/hr over 30 Minutes Intravenous  Once 01/06/20 1921 01/06/20 1931   01/06/20 2015  remdesivir 100 mg in sodium chloride 0.9 % 100 mL IVPB  Status:  Discontinued         100 mg 200 mL/hr over 30 Minutes Intravenous  Once 01/06/20 1921 01/06/20 1931     .   SIGNIFICANT EVENTS 1154 EKG with concerning ischemic changes, though difficult to tell if this is rate related. First trop is slightly elevated. Working on access to provide diltiazem, then will repeat ekg  [DS]  1210 Diltiazem provided and repeat EKGs demonstrate inferior ischemic changes. When I compare this to his admission last month looks similar, though slightly worse degree of elevations. He reports improving symptoms. Aspirin provided. I paged the STEMI provider on call  [DS]  1222 Spoke with Dr. Fletcher Anon, STEMI cardiologist on-call, who is reviewed EKGs and agrees that while EKG today is of worse magnitude with ischemic changes, it is not STEMI criteria at this time. He recommends management of CHF exacerbation with serial cardiac enzymes.   11/2 severe cardiogenic shock 11/2 remains on vent 11/3 severe resp failure 11/3 severe sCHF  11/4 - pient remains critically ill Prognosis is guarded +cardiogenic shokc Therapy for pneumonia    SUBJECTIVE/OVERNIGHT/INTERVAL HX   01/10/2020 - has ongoing fever 101F. PCT trending down but high. On vent. Started TF yesesterday. On Miralax daily. Still constipated. Pharmacy adding senokot. On vent , fio2 32%, peep 8.  On neo gtt, versed gtt, fent gtt. R-> RASs -4 despite 50% drop    BP 100/71   Pulse 83   Temp 100 F (37.8 C)   Resp (!) 30   Ht 5' 7"  (1.702 m)   Wt 78 kg   SpO2 96%   BMI 26.93 kg/m    I/O last 3 completed shifts: In: 4475.8 [I.V.:1334.6; NG/GT:2341.2; IV Piggyback:800] Out: 2423 [Urine:3075] Total I/O In: 337.9 [I.V.:197.9; NG/GT:90; IV Piggyback:50] Out: 1300 [Urine:1300]  SpO2: 96 % FiO2 (%): 32 %  Estimated body mass index is 26.93 kg/m as calculated from the following:   Height as of this encounter: 5' 7"  (1.702 m).   Weight as of this encounter: 78 kg.    \General Appearance:  Looks criticall ill  OBESE - no Head:  Normocephalic, without obvious abnormality, atraumatic Eyes:  PERRL - yes, conjunctiva/corneas - muddy     Ears:  Normal external ear canals, both ears Nose:  G tube - no Throat:  ETT TUBE - yes , OG tube - yes Neck:  Supple,  No enlargement/tenderness/nodules Lungs: Clear to auscultation bilaterally, Ventilator   Synchrony - yes Heart:  S1 and S2 normal, no murmur, CVP - no.  Pressors - yes Abdomen:  Soft, no masses, no organomegaly Genitalia / Rectal:  Not done Extremities:  Extremities- intact Skin:  ntact in exposed areas . Sacral area - not examined Neurologic:  Sedation -  sedatinn gtt -> RASS - -4       LABS    PULMONARY Recent Labs  Lab 01/06/20 1147 01/06/20 1501 01/06/20 1955 01/08/20 0500  PHART 7.23* 7.15* 7.39 7.33*  PCO2ART 39 77* 32 39  PO2ART 84 114* 145* 85  HCO3 16.3* 26.8 19.4* 20.6  O2SAT 94.0 97.0 99.2 95.6    CBC Recent Labs  Lab 01/08/20 0500 01/09/20 0152 01/10/20 0420  HGB 15.9 15.3 14.7  HCT 51.3 50.4 49.1  WBC 16.7* 18.4* 16.1*  PLT 818* 815* 802*    COAGULATION No results for input(s): INR in the last 168 hours.  CARDIAC  No results for input(s): TROPONINI in the last 168 hours. No results for input(s): PROBNP in the last 168 hours.   CHEMISTRY Recent Labs  Lab 01/07/20 0430 01/07/20 0430 01/07/20 0647 01/07/20 1145 01/07/20 1755 01/07/20 2034 01/07/20 2034 01/08/20 0500 01/08/20 0500 01/08/20 1725 01/09/20 0152 01/10/20 0420  NA PATIENT IDENTIFICATION ERROR. PLEASE DISREGARD RESULTS. ACCOUNT WILL BE CREDITED.  --  141  --   --   --   --  139  --   --  141 141  K TEST WILL BE CREDITED   < > 4.0   < >  --  3.4*   < > 4.4   < >  --  4.3 4.8  CL TEST WILL BE CREDITED  --  107  --   --   --   --  109  --   --  109 108  CO2 TEST WILL BE CREDITED  --  20*  --   --   --   --  19*  --   --  23 25  GLUCOSE TEST WILL BE CREDITED  --  27*  --   --   --   --  129*  --   --  164* 149*  BUN TEST WILL BE  CREDITED  --  21  --   --   --   --  25*  --   --  34* 41*  CREATININE TEST WILL BE CREDITED  --  1.36*  --   --   --   --  0.91  --   --  1.04 1.12  CALCIUM TEST WILL BE CREDITED  --  8.7*  --   --   --   --  7.8*  --   --  7.8* 9.0  MG 2.4   < >  --   --  1.2* 1.7  --  2.8*  --  2.3 2.1  --   PHOS  --   --   --   --  1.7*  --   --  4.4  --  3.1 3.2  --    < > = values in this interval not displayed.   Estimated Creatinine Clearance: 59.8 mL/min (by C-G formula based on SCr of 1.12 mg/dL).   LIVER No results for input(s): AST, ALT, ALKPHOS, BILITOT, PROT, ALBUMIN, INR in the last 168 hours.   INFECTIOUS Recent Labs  Lab 01/06/20 1156 01/06/20 2017 01/07/20 2034 01/08/20 0500 01/09/20 0152  LATICACIDVEN 7.7* 2.8*  --  1.2  --   PROCALCITON  --   --  5.90 4.79 3.51     ENDOCRINE CBG (last 3)  Recent Labs    01/10/20 0346 01/10/20 0719 01/10/20 1150  GLUCAP 133* 217* 158*         IMAGING x48h  - image(s) personally visualized  -  highlighted in bold DG Chest 1 View  Result Date: 01/09/2020 CLINICAL DATA:  PICC placement. EXAM: CHEST  1 VIEW COMPARISON:  Earlier today. FINDINGS: The right PICC has been repositioned with its tip at the superior cavoatrial junction. Endotracheal tube in satisfactory position. Stable median sternotomy wires and prosthetic heart valve. Stable enlarged heart. Interval mild increase in linear atelectasis at the left lung base. Stable mild diffuse peribronchial thickening. Unremarkable bones. IMPRESSION: 1. Satisfactory position of the right PICC with its tip at the superior cavoatrial junction. 2. Mildly increased left basilar atelectasis. 3. Stable mild chronic bronchitic changes. 4. Stable cardiomegaly. Electronically Signed   By: Claudie Revering M.D.   On: 01/09/2020 10:56   DG Chest Port 1 View  Result Date: 01/09/2020 CLINICAL DATA:  Shortness of breath. EXAM: PORTABLE CHEST 1 VIEW COMPARISON:  01/08/2020 FINDINGS: 0807 hours. Low lung  volumes. The cardio pericardial silhouette is enlarged. Endotracheal tube tip is 5.5 cm above the base of the carina. Right PICC line tip has been repositioned in the interval, likely in the azygos vein. Vascular congestion again noted with underlying chronic interstitial changes. Retrocardiac atelectasis/infiltrate is similar to prior. Telemetry leads overlie the chest. IMPRESSION: 1. Right PICC line tip appears to have flipped up into the azygos vein. 2. Otherwise stable exam. 3. Low volume film with persistent retrocardiac atelectasis/infiltrate. These results will be called to the ordering clinician or representative by the Radiologist Assistant, and communication documented in the PACS or [employer]. Electronically Signed   By: Misty Stanley M.D.   On: 01/09/2020 09:06          ASSESSMENT AND PLAN SYNOPSIS 69 yo [name] with severe resp failure due to severe metabolic acidosis with severe sCHF exacerbation previous h/o COVID in setting of drug abuse, therapy for CAP v HCAP   Severe ACUTE Hypoxic and Hypercapnic Respiratory Failure   01/10/2020 - > does not meet criteria for SBT/Extubation in setting of Acute Respiratory Failure due to  Sedation RASS -4  plan -continue Full MV support -continue Bronchodilator Therapy -Wean Fio2 and PEEP as tolerated -will perform SAT/SBT when respiratory parameters are met -VAP/VENT bundle implementation  ACUTE SYSTOLIC CARDIAC FAILURE- EF 20% Known A Fib Circulatory shock  01/10/2020 - cards following. On lasix 20m bid and neo. HR 93 intermittent A F  Plan -   - chagne neo to levophed  -check coox  - continue lasix - goal SBP > 100 and MAP > 60   ACUTE KIDNEY INJURY/Renal Failure  01/10/2020 - BUN rising. Creat stable  plan  -continue Foley Catheter-assess need -Avoid nephrotoxic agents -Follow urine output, BMP -Ensure adequate renal perfusion, optimize oxygenation -Renal dose medications    NEUROLOGY Acute toxic metabolic  encephalopathy, need for sedation  01/10/2020 - on fent gtt and vesed gtt. RASS -4  Plan  - dc versed gtt  - fent wean  - goal RASS to -2   ID-therapy for CAP  01/10/2020 - still febrile despite CAP Rx  Plan  - check PCT - change from CAP to HCAP Rx      Best practice:  Diet: TF Pain/Anxiety/Delirium protocol (if indicated): as above VAP protocol (if indicated): yes DVT prophylaxis: welqiuis GI prophylaxis: pepcid Glucose control: ssi Mobility: bed rest Code Status: full Family Communication: callled dtr RBenard [phone]- and LMTCB for update. Indicated she can talk to RN Disposition: icu    Nutrition Status: Nutrition Problem: Moderate Malnutrition Etiology: chronic illness (CHF) Signs/Symptoms: moderate fat depletion,  moderate muscle depletion Interventions: Tube feeding, MVI    GI GI PROPHYLAXIS as indicated  NUTRITIONAL STATUS Nutrition Status: Nutrition Problem: Moderate Malnutrition Etiology: chronic illness (CHF) Signs/Symptoms: moderate fat depletion, moderate muscle depletion Interventions: Tube feeding, MVI      ATTESTATION & SIGNATURE   The patient Travis Maxwell is critically ill with multiple organ systems failure and requires high complexity decision making for assessment and support, frequent evaluation and titration of therapies, application of advanced monitoring technologies and extensive interpretation of multiple databases.   Critical Care Time devoted to patient care services described in this note is  45  Minutes. This time reflects time of care of this signee Dr Brand Males. This critical care time does not reflect procedure time, or teaching time or supervisory time of PA/NP/Med student/Med Resident etc but could involve care discussion time     Dr. Brand Males, M.D., [facility].C.P Pulmonary and Critical Care Medicine Staff Physician [facility] Pager: [phone], If  no answer or between  15:00h - 7:00h: call [phone]  01/10/2020 2:45 PM

## 2020-01-10 NOTE — Progress Notes (Signed)
Pt became agitated towards end of shift, tachycardic, Respirations increased and movement of all four extremities. RT was at bed side, fentanyl was increased and Versed 1mg  was given, RASS goal is -2 per MD order.

## 2020-01-11 ENCOUNTER — Inpatient Hospital Stay: Payer: Medicare HMO

## 2020-01-11 DIAGNOSIS — J9600 Acute respiratory failure, unspecified whether with hypoxia or hypercapnia: Secondary | ICD-10-CM | POA: Diagnosis not present

## 2020-01-11 LAB — BASIC METABOLIC PANEL
Anion gap: 11 (ref 5–15)
BUN: 34 mg/dL — ABNORMAL HIGH (ref 8–23)
CO2: 28 mmol/L (ref 22–32)
Calcium: 8.9 mg/dL (ref 8.9–10.3)
Chloride: 109 mmol/L (ref 98–111)
Creatinine, Ser: 0.84 mg/dL (ref 0.61–1.24)
GFR, Estimated: >60 mL/min (ref >60)
Glucose, Bld: 184 mg/dL — ABNORMAL HIGH (ref 70–99)
Potassium: 4.3 mmol/L (ref 3.5–5.1)
Sodium: 148 mmol/L — ABNORMAL HIGH (ref 135–145)

## 2020-01-11 LAB — STREP PNEUMONIAE URINARY ANTIGEN: Strep Pneumo Urinary Antigen: NEGATIVE

## 2020-01-11 LAB — CULTURE, BLOOD (ROUTINE X 2)
Culture: NO GROWTH
Culture: NO GROWTH
Special Requests: ADEQUATE
Special Requests: ADEQUATE

## 2020-01-11 LAB — PROCALCITONIN: Procalcitonin: 1.09 ng/mL

## 2020-01-11 LAB — CBC WITH DIFFERENTIAL/PLATELET
Abs Immature Granulocytes: 0.11 10*3/uL — ABNORMAL HIGH (ref 0.00–0.07)
Basophils Absolute: 0.2 10*3/uL — ABNORMAL HIGH (ref 0.0–0.1)
Basophils Relative: 2 %
Eosinophils Absolute: 0.8 10*3/uL — ABNORMAL HIGH (ref 0.0–0.5)
Eosinophils Relative: 5 %
HCT: 50.5 % (ref 39.0–52.0)
Hemoglobin: 15.1 g/dL (ref 13.0–17.0)
Immature Granulocytes: 1 %
Lymphocytes Relative: 11 %
Lymphs Abs: 1.6 10*3/uL (ref 0.7–4.0)
MCH: 20.4 pg — ABNORMAL LOW (ref 26.0–34.0)
MCHC: 29.9 g/dL — ABNORMAL LOW (ref 30.0–36.0)
MCV: 68.3 fL — ABNORMAL LOW (ref 80.0–100.0)
Monocytes Absolute: 1.6 10*3/uL — ABNORMAL HIGH (ref 0.1–1.0)
Monocytes Relative: 11 %
Neutro Abs: 10.7 10*3/uL — ABNORMAL HIGH (ref 1.7–7.7)
Neutrophils Relative %: 70 %
Platelets: 826 10*3/uL — ABNORMAL HIGH (ref 150–400)
RBC: 7.39 MIL/uL — ABNORMAL HIGH (ref 4.22–5.81)
RDW: 25.2 % — ABNORMAL HIGH (ref 11.5–15.5)
Smear Review: NORMAL
WBC: 15.1 10*3/uL — ABNORMAL HIGH (ref 4.0–10.5)
nRBC: 0 % (ref 0.0–0.2)

## 2020-01-11 LAB — GLUCOSE, CAPILLARY
Glucose-Capillary: 188 mg/dL — ABNORMAL HIGH (ref 70–99)
Glucose-Capillary: 205 mg/dL — ABNORMAL HIGH (ref 70–99)
Glucose-Capillary: 184 mg/dL — ABNORMAL HIGH (ref 70–99)
Glucose-Capillary: 215 mg/dL — ABNORMAL HIGH (ref 70–99)
Glucose-Capillary: 153 mg/dL — ABNORMAL HIGH (ref 70–99)

## 2020-01-11 LAB — MAGNESIUM: Magnesium: 1.7 mg/dL (ref 1.7–2.4)

## 2020-01-11 LAB — HEPATIC FUNCTION PANEL
ALT: 22 U/L (ref 0–44)
AST: 21 U/L (ref 15–41)
Albumin: 2.8 g/dL — ABNORMAL LOW (ref 3.5–5.0)
Alkaline Phosphatase: 49 U/L (ref 38–126)
Bilirubin, Direct: 0.1 mg/dL (ref 0.0–0.2)
Indirect Bilirubin: 0.7 mg/dL (ref 0.3–0.9)
Total Bilirubin: 0.8 mg/dL (ref 0.3–1.2)
Total Protein: 7 g/dL (ref 6.5–8.1)

## 2020-01-11 LAB — LACTIC ACID, PLASMA: Lactic Acid, Venous: 1.3 mmol/L (ref 0.5–1.9)

## 2020-01-11 LAB — COOXEMETRY PANEL

## 2020-01-11 LAB — PHOSPHORUS: Phosphorus: 3.8 mg/dL (ref 2.5–4.6)

## 2020-01-11 MED ORDER — SODIUM CHLORIDE 0.9 % IV SOLN
2.0000 g | Freq: Three times a day (TID) | INTRAVENOUS | Status: DC
  Administered 2020-01-11 – 2020-01-12 (×4): 2 g via INTRAVENOUS
  Filled 2020-01-11 (×5): qty 2

## 2020-01-11 MED ORDER — DEXMEDETOMIDINE HCL IN NACL 400 MCG/100ML IV SOLN
0.4000 ug/kg/h | INTRAVENOUS | Status: DC
  Administered 2020-01-11: 0.4 ug/kg/h via INTRAVENOUS
  Administered 2020-01-11: 0.8 ug/kg/h via INTRAVENOUS
  Administered 2020-01-12: 1 ug/kg/h via INTRAVENOUS
  Administered 2020-01-12: 0.7 ug/kg/h via INTRAVENOUS
  Administered 2020-01-12: 0.8 ug/kg/h via INTRAVENOUS
  Administered 2020-01-12: 1.1 ug/kg/h via INTRAVENOUS
  Filled 2020-01-11 (×6): qty 100

## 2020-01-11 MED ORDER — MAGNESIUM SULFATE 2 GM/50ML IV SOLN
2.0000 g | Freq: Once | INTRAVENOUS | Status: AC
  Administered 2020-01-11: 2 g via INTRAVENOUS
  Filled 2020-01-11: qty 50

## 2020-01-11 MED ORDER — METOPROLOL TARTRATE 25 MG PO TABS
12.5000 mg | ORAL_TABLET | Freq: Two times a day (BID) | ORAL | Status: DC
  Administered 2020-01-11 – 2020-01-14 (×3): 12.5 mg via ORAL
  Filled 2020-01-11 (×3): qty 1

## 2020-01-11 NOTE — Progress Notes (Signed)
[facility] Cardiology  SUBJECTIVE: Patient remains intubated   Vitals:   01/11/20 0000 01/11/20 0100 01/11/20 0308 01/11/20 0500  BP: 123/73 100/81    Pulse: (!) 104 90    Resp: (!) 30 (!) 30    Temp: 99.3 F (37.4 C) 99.7 F (37.6 C)    TempSrc:      SpO2:  97% 97%   Weight:    77.9 kg  Height:         Intake/Output Summary (Last 24 hours) at 01/11/2020 4132 Last data filed at 01/11/2020 0600 Gross per 24 hour  Intake 703.08 ml  Output 4075 ml  Net -3371.92 ml      PHYSICAL EXAM  General: Well developed, well nourished, in no acute distress HEENT:  Normocephalic and atramatic Neck:  No JVD.  Lungs: Clear bilaterally to auscultation and percussion. Heart: HRRR . Normal S1 and S2 without gallops or murmurs.  Abdomen: Bowel sounds are positive, abdomen soft and non-tender  Msk:  Back normal, normal gait. Normal strength and tone for age. Extremities: No clubbing, cyanosis or edema.   Neuro: Alert and oriented X 3. Psych:  Good affect, responds appropriately   LABS: Basic Metabolic Panel: Recent Labs    01/09/20 0152 01/09/20 0152 01/10/20 0420 01/11/20 0008  NA 141   < > 141 148*  K 4.3   < > 4.8 4.3  CL 109   < > 108 109  CO2 23   < > 25 28  GLUCOSE 164*   < > 149* 184*  BUN 34*   < > 41* 34*  CREATININE 1.04   < > 1.12 0.84  CALCIUM 7.8*   < > 9.0 8.9  MG 2.1  --   --  1.7  PHOS 3.2  --   --  3.8   < > = values in this interval not displayed.   Liver Function Tests: Recent Labs    01/11/20 0008  AST 21  ALT 22  ALKPHOS 49  BILITOT 0.8  PROT 7.0  ALBUMIN 2.8*   No results for input(s): LIPASE, AMYLASE in the last 72 hours. CBC: Recent Labs    01/10/20 0420 01/11/20 0008  WBC 16.1* 15.1*  NEUTROABS 11.6* 10.7*  HGB 14.7 15.1  HCT 49.1 50.5  MCV 68.3* 68.3*  PLT 802* 826*   Cardiac Enzymes: No results for input(s): CKTOTAL, CKMB, CKMBINDEX, TROPONINI in the last 72 hours. BNP: Invalid input(s): POCBNP D-Dimer: No results for input(s):  DDIMER in the last 72 hours. Hemoglobin A1C: No results for input(s): HGBA1C in the last 72 hours. Fasting Lipid Panel: No results for input(s): CHOL, HDL, LDLCALC, TRIG, CHOLHDL, LDLDIRECT in the last 72 hours. Thyroid Function Tests: No results for input(s): TSH, T4TOTAL, T3FREE, THYROIDAB in the last 72 hours.  Invalid input(s): FREET3 Anemia Panel: No results for input(s): VITAMINB12, FOLATE, FERRITIN, TIBC, IRON, RETICCTPCT in the last 72 hours.  DG Chest 1 View  Result Date: 01/09/2020 CLINICAL DATA:  PICC placement. EXAM: CHEST  1 VIEW COMPARISON:  Earlier today. FINDINGS: The right PICC has been repositioned with its tip at the superior cavoatrial junction. Endotracheal tube in satisfactory position. Stable median sternotomy wires and prosthetic heart valve. Stable enlarged heart. Interval mild increase in linear atelectasis at the left lung base. Stable mild diffuse peribronchial thickening. Unremarkable bones. IMPRESSION: 1. Satisfactory position of the right PICC with its tip at the superior cavoatrial junction. 2. Mildly increased left basilar atelectasis. 3. Stable mild chronic bronchitic changes. 4.  Stable cardiomegaly. Electronically Signed   By: Beckie Salts M.D.   On: 01/09/2020 10:56     Echo LVEF less than 20% by 2D echocardiogram 08/13/2019  TELEMETRY: Sinus tachycardia:  ASSESSMENT AND PLAN:  Active Problems:   Respiratory failure (HCC)   Malnutrition of moderate degree    1. Acute on chronic systolic CHF, with LVEF <20%, currently on vasopressor, and IV Lasix BID. BNP 1046. Chest xray with improved pulmonary edema since prior study with chronic pulmonary venous hypertension present. Questionable medical non-adherence. 2. Coronary artery disease, status post NSTEMI 07/2017, status post DES to mid RCA; patient presented with chest pain. Troponin borderline elevated 71->91->89 (191 one month prior), most consistent with myocardial injury secondary to respiratory failure    (ICD10 I5A ) 3. Paroxsymal atrial fibrillation, on amiodarone at home, and Eliquis for stroke prevention. Currently in sinus rhythm off amiodarone. 4. Frequent premature ventricular contractions 5. Aortic stenosis, status post Edwards magna tissue valve in 03/2018. Most recent echocardiogram 08/2019 shows mild aortic regurgitation 6. Tobacco and marijuana use  Recommendations  1.  Agree with current therapy 2.  Continue diuresis 3.  Carefully monitor renal status 4.  Continue Eliquis for stroke prevention 5.  Defer ischemic work-up at this time, troponin borderline elevated, without evidence for ischemia, most consistent with myocardial injury (ICD 10 I5A) secondary to respiratory failure 6.  Defer resuming amiodarone at this time 7.  Resume metoprolol succinate and Entresto following extubation   Marcina Millard, MD, PhD, [facility] 01/11/2020 9:37 AM

## 2020-01-11 NOTE — Progress Notes (Signed)
PT Cancellation Note  Patient Details Name: Travis Maxwell MRN: [id number] DOB: Jan 14, 1951   Cancelled Treatment:    Reason Eval/Treat Not Completed: Other (comment). Orders received and chart reviewed. Plan for extubation this date. Will hold and evaluate next date, if medically stable.   Arriona Prest 01/11/2020, 1:27 PM  Elizabeth Palau, PT, DPT [phone]

## 2020-01-11 NOTE — Progress Notes (Signed)
OT Cancellation Note  Patient Details Name: Travis Maxwell MRN: [id number] DOB: Oct 27, 1950   Cancelled Treatment:    Reason Eval/Treat Not Completed: Patient not medically ready. Orders received and chart reviewed. Plan for extubation this date. Will hold and evaluate next date, if medically stable.  Kathie Dike, M.S. OTR/L  01/11/20, 1:32 PM  ascom [phone]

## 2020-01-11 NOTE — Progress Notes (Signed)
PHARMACY NOTE:  ANTIMICROBIAL RENAL DOSAGE ADJUSTMENT  Current antimicrobial regimen includes a mismatch between antimicrobial dosage and estimated renal function.  As per policy approved by the Pharmacy & Therapeutics and Medical Executive Committees, the antimicrobial dosage will be adjusted accordingly.  Current antimicrobial dosage:  Cefepime 2g q12  Indication: possible HCAP  Renal Function:  Estimated Creatinine Clearance: 79.8 mL/min (by C-G formula based on SCr of 0.84 mg/dL). []      On intermittent HD, scheduled: []      On CRRT    Antimicrobial dosage has been changed to:  Cefepime 2g q8h  Additional comments:Day 4 abx, Day 2 Cefepime  Thank you for allowing pharmacy to be a part of this patient's care.  , PharmD, BCPS Clinical Pharmacist 01/11/2020 10:17 AM

## 2020-01-11 NOTE — Progress Notes (Signed)
CRITICAL CARE NOTE   Date of admit : 01/06/2020 LOS: 5 days BRIEF     Dx of COVID 9//25/212021. Patient presents to the ED 01/06/2020 -  in respiratory distress complaining of chest pain and shortness of breath. He is reporting orthopnea and increased swelling. History is limited due to acuity of condition and patient's respiratory status. Patient was placed on biPAP, patient progressive worsened and was emergently intubated.  Patient has severe metabolic acidosis +MARIJUANA . Diagnosis of with severe systolic CHF EF 20%  -history of HTN, DM, GERD, tobacco abuse, CAD s/p stenting, AVR with bioprosthetic valve, EF of20%. Paroxysmal A. fib on Eliquis.    MICRO Recent Results (from the past 240 hour(s))  Blood culture (routine x 2)     Status: None   Collection Time: 01/06/20  1:13 PM   Specimen: BLOOD  Result Value Ref Range Status   Specimen Description BLOOD BLOOD LEFT HAND  Final   Special Requests   Final    BOTTLES DRAWN AEROBIC AND ANAEROBIC Blood Culture adequate volume   Culture   Final    NO GROWTH 5 DAYS Performed at [facility], [address], [place], [place] [address]    Report Status 01/11/2020 FINAL  Final  Blood culture (routine x 2)     Status: None   Collection Time: 01/06/20  1:26 PM   Specimen: BLOOD  Result Value Ref Range Status   Specimen Description BLOOD LEFT ANTECUBITAL  Final   Special Requests   Final    BOTTLES DRAWN AEROBIC AND ANAEROBIC Blood Culture adequate volume   Culture   Final    NO GROWTH 5 DAYS Performed at [facility], [address], [place], [place] [address]    Report Status 01/11/2020 FINAL  Final  MRSA PCR Screening     Status: None   Collection Time: 01/06/20  2:43 PM   Specimen: Nasal Mucosa; Nasopharyngeal  Result Value Ref Range Status   MRSA by PCR NEGATIVE NEGATIVE Final    Comment:        The  GeneXpert MRSA Assay (FDA approved for NASAL specimens only), is one component of a comprehensive MRSA colonization surveillance program. It is not intended to diagnose MRSA infection nor to guide or monitor treatment for MRSA infections. Performed at [facility], [address] [place], [place] [address]   Culture, respiratory (non-expectorated)     Status: None (Preliminary result)   Collection Time: 01/09/20  2:45 AM   Specimen: Tracheal Aspirate; Respiratory  Result Value Ref Range Status   Specimen Description   Final    TRACHEAL ASPIRATE Performed at [facility], [address] [place], [place] [address]    Special Requests   Final    NONE Performed at [facility], [address] [place], [place] [address]    Gram Stain   Final    MODERATE WBC PRESENT, PREDOMINANTLY PMN RARE GRAM POSITIVE COCCI    Culture   Final    CULTURE REINCUBATED FOR BETTER GROWTH Performed at [facility], [address], [place], [place] [address]    Report Status PENDING  Incomplete     ANTIBIOTICS Anti-infectives (From admission, onward)   Start     Dose/Rate Route Frequency Ordered Stop   01/10/20 1600  ceFEPIme (MAXIPIME) 2 g in sodium chloride 0.9 % 100 mL IVPB        2 g 200 mL/hr over 30 Minutes Intravenous Every 12 hours 01/10/20 1420  01/07/20 2330  azithromycin (ZITHROMAX) 500 mg in sodium chloride 0.9 % 250 mL IVPB        500 mg 250 mL/hr over 60 Minutes Intravenous Every 24 hours 01/07/20 2234 01/12/20 2329   01/07/20 2330  cefTRIAXone (ROCEPHIN) 2 g in sodium chloride 0.9 % 100 mL IVPB  Status:  Discontinued        2 g 200 mL/hr over 30 Minutes Intravenous Every 24 hours 01/07/20 2234 01/10/20 1419   01/06/20 2015  remdesivir 100 mg in sodium chloride 0.9 % 100 mL IVPB  Status:  Discontinued        100 mg 200 mL/hr over 30 Minutes Intravenous  Once 01/06/20 1921 01/06/20 1931   01/06/20 2015  remdesivir 100 mg in sodium chloride  0.9 % 100 mL IVPB  Status:  Discontinued        100 mg 200 mL/hr over 30 Minutes Intravenous  Once 01/06/20 1921 01/06/20 1931   01/06/20 2015  remdesivir 100 mg in sodium chloride 0.9 % 100 mL IVPB  Status:  Discontinued        100 mg 200 mL/hr over 30 Minutes Intravenous  Once 01/06/20 1921 01/06/20 1931     .   SIGNIFICANT EVENTS 1154 EKG with concerning ischemic changes, though difficult to tell if this is rate related. First trop is slightly elevated. Working on access to provide diltiazem, then will repeat ekg  [DS]  1210 Diltiazem provided and repeat EKGs demonstrate inferior ischemic changes. When I compare this to his admission last month looks similar, though slightly worse degree of elevations. He reports improving symptoms. Aspirin provided. I paged the STEMI provider on call  [DS]  1222 Spoke with Dr. Kirke Corin, STEMI cardiologist on-call, who is reviewed EKGs and agrees that while EKG today is of worse magnitude with ischemic changes, it is not STEMI criteria at this time. He recommends management of CHF exacerbation with serial cardiac enzymes.   11/2 severe cardiogenic shock 11/2 remains on vent 11/3 severe resp failure 11/3 severe sCHF  11/4 - pient remains critically ill Prognosis is guarded +cardiogenic shokc Therapy for pneumonia  01/10/20 - has ongoing fever 101F. PCT trending down but high. On vent. Started TF yesesterday. On Miralax daily. Still constipated. Pharmacy adding senokot. On vent , fio2 32%, peep 8.  On neo gtt, versed gtt, fent gtt. R-> RASs -4 despite 50% drop.  Urine streptococcal antigen negative    SUBJECTIVE/OVERNIGHT/INTERVAL HX   01/11/2020 -antibiotics changed to HCAP treatment yesterday and still having low-grade fever but white count improving. Remains on the ventilator.  Off sedation infusion fentanyl since the last 45 minutes to an hour.  Still drowsy but does protect his airway.  Doing spontaneous breathing trial 10/5.  Off  pressors.  Lasix continues.   BP 100/81   Pulse 90   Temp 99.7 F (37.6 C)   Resp (!) 30   Ht  (1.702 m)   Wt 77.9 kg   SpO2 97%   BMI 26.90 kg/m    I/O last 3 completed shifts: In: 2422.7 [I.V.:1132.7; NG/GT:740; IV Piggyback:550] Out: 5350 [Urine:5350] No intake/output data recorded.  SpO2: 97 % FiO2 (%): 32 %  Estimated body mass index is 26.9 kg/m as calculated from the following:   Height as of this encounter:  (1.702 m).   Weight as of this encounter: 77.9 kg.   Adult male on the ventilator RASS sedation score -3 without any sedation infusion.  Has good gag reflex on  suctioning.  Minimal secretions only abdomen soft clear to auscultation extremities without any edema.  Clear to auscultation.      LABS    PULMONARY Recent Labs  Lab 01/06/20 1147 01/06/20 1501 01/06/20 1955 01/08/20 0500 01/11/20 0008  PHART 7.23* 7.15* 7.39 7.33*  --   PCO2ART 39 77* 32 39  --   PO2ART 84 114* 145* 85  --   HCO3 16.3* 26.8 19.4* 20.6  --   O2SAT 94.0 97.0 99.2 95.6 PENDING    CBC Recent Labs  Lab 01/09/20 0152 01/10/20 0420 01/11/20 0008  HGB 15.3 14.7 15.1  HCT 50.4 49.1 50.5  WBC 18.4* 16.1* 15.1*  PLT 815* 802* 826*    COAGULATION No results for input(s): INR in the last 168 hours.  CARDIAC  No results for input(s): TROPONINI in the last 168 hours. No results for input(s): PROBNP in the last 168 hours.   CHEMISTRY Recent Labs  Lab 01/07/20 0647 01/07/20 1145 01/07/20 1755 01/07/20 1755 01/07/20 2034 01/07/20 2034 01/08/20 0500 01/08/20 0500 01/08/20 1725 01/09/20 0152 01/09/20 0152 01/10/20 0420 01/11/20 0008  NA 141  --   --   --   --   --  139  --   --  141  --  141 148*  K 4.0   < >  --   --  3.4*   < > 4.4   < >  --  4.3   < > 4.8 4.3  CL 107  --   --   --   --   --  109  --   --  109  --  108 109  CO2 20*  --   --   --   --   --  19*  --   --  23  --  25 28  GLUCOSE 67*  --   --   --   --   --  129*  --   --  164*  --   149* 184*  BUN 21  --   --   --   --   --  25*  --   --  34*  --  41* 34*  CREATININE 1.36*  --   --   --   --   --  0.91  --   --  1.04  --  1.12 0.84  CALCIUM 8.7*  --   --   --   --   --  7.8*  --   --  7.8*  --  9.0 8.9  MG  --   --  1.2*   < > 1.7  --  2.8*  --  2.3 2.1  --   --  1.7  PHOS  --   --  1.7*  --   --   --  4.4  --  3.1 3.2  --   --  3.8   < > = values in this interval not displayed.   Estimated Creatinine Clearance: 79.8 mL/min (by C-G formula based on SCr of 0.84 mg/dL).   LIVER Recent Labs  Lab 01/11/20 0008  AST 21  ALT 22  ALKPHOS 49  BILITOT 0.8  PROT 7.0  ALBUMIN 2.8*     INFECTIOUS Recent Labs  Lab 01/06/20 2017 01/07/20 2034 01/08/20 0500 01/09/20 0152 01/11/20 0008  LATICACIDVEN 2.8*  --  1.2  --  1.3  PROCALCITON  --    < > 4.79 3.51 1.09   < > =  values in this interval not displayed.     ENDOCRINE CBG (last 3)  Recent Labs    01/10/20 2327 01/11/20 0449 01/11/20 0726  GLUCAP 174* 188* 205*         IMAGING x48h  - image(s) personally visualized  -   highlighted in bold DG Chest 1 View  Result Date: 01/09/2020 CLINICAL DATA:  PICC placement. EXAM: CHEST  1 VIEW COMPARISON:  Earlier today. FINDINGS: The right PICC has been repositioned with its tip at the superior cavoatrial junction. Endotracheal tube in satisfactory position. Stable median sternotomy wires and prosthetic heart valve. Stable enlarged heart. Interval mild increase in linear atelectasis at the left lung base. Stable mild diffuse peribronchial thickening. Unremarkable bones. IMPRESSION: 1. Satisfactory position of the right PICC with its tip at the superior cavoatrial junction. 2. Mildly increased left basilar atelectasis. 3. Stable mild chronic bronchitic changes. 4. Stable cardiomegaly. Electronically Signed   By: Beckie Salts M.D.   On: 01/09/2020 10:56          ASSESSMENT AND PLAN SYNOPSIS 69 yo [name] with severe resp failure due to severe metabolic acidosis  with severe sCHF exacerbation previous h/o COVID in setting of drug abuse, therapy for CAP v HCAP   Severe ACUTE Hypoxic and Hypercapnic Respiratory Failure   01/11/2020 - > doing spontaneous breathing trial.  Almost meeting successful extubation criteria but a little bit too drowsy but then also gets agitated.  Protecting airway  plan -Start Precedex to balance between sedation drowsiness and agitation -> and then proceed with extubation if doing well -    ACUTE SYSTOLIC CARDIAC FAILURE- EF 20% Known A Fib Circulatory shock  01/11/2020 -off Neo-Synephrine/ On lasix 40mg  bid and neo. H heart rate 114 maintaining blood pressure.  Off Levophed  Plan -   -DC pressors of the MAR - continue lasix -Continue scheduled Eliquis and Lopressor for A. Fib -Continue Lipitor - goal SBP > 100 and MAP > 60 -Appreciate cardiology input  ACUTE KIDNEY INJURY/Renal Failure  01/11/2020 -BUN and creatinine improved with Lasix   plan -Monitor renal function with diuresis -continue Foley Catheter-assess need -Avoid nephrotoxic agents -Follow urine output, BMP -Ensure adequate renal perfusion, optimize oxygenation -Renal dose medications    NEUROLOGY Acute toxic metabolic encephalopathy, need for sedation  01/11/2020 -off sedation infusion x1 hour RASS sedation score -3 but intermittently breaking through to +1+2. Moving all 4s  Plan -DC fentanyl infusion of the MAR -Start Precedex infusion to facilitate extubation striking a balance between sedation and agitation - goal RASS to -1 to +1   ID-concern for CAP at admission but urine strep negative.  Possible HCAP  01/11/2020 - still febrile despite HCAP treatment although fever curve?  Improving but procalcitonin definitely improving  Plan  -Continue cefepime as above      Best practice:  Diet: TF hold anticipating extubation Pain/Anxiety/Delirium protocol (if indicated): as above VAP protocol (if indicated): yes DVT prophylaxis:  elqiuis for A. fib GI prophylaxis: pepcid Glucose control: ssi Mobility: bed rest Code Status: full Family Communication: callled 13/08/2019 [phone] -- 01/11/20 updated. Disposition: icu      ATTESTATION & SIGNATURE   The patient Travis Maxwell is critically ill with multiple organ systems failure and requires high complexity decision making for assessment and support, frequent evaluation and titration of therapies, application of advanced monitoring technologies and extensive interpretation of multiple databases.   Critical Care Time devoted to patient care services described in this note is  40  Minutes. This time reflects time of care of this signee Dr Kalman Shan. This critical care time does not reflect procedure time, or teaching time or supervisory time of PA/NP/Med student/Med Resident etc but could involve care discussion time     Dr. Kalman Shan, M.D., [facility].C.P Pulmonary and Critical Care Medicine Staff Physician [facility] [facility] Pager: [phone], If no answer or between  15:00h - 7:00h: call [phone]  01/11/2020 10:05 AM

## 2020-01-11 NOTE — Progress Notes (Signed)
A bit more awake. RASS -2 but on precedex. Mostly calm. Wife at bedside. Felt he recognized her  Plan  - stop precedex and extubate to bipap    SIGNATURE    Dr. Kalman Shan, M.D., F.C.C.P,  Pulmonary and Critical Care Medicine Staff Physician, [facility] Director - Interstitial Lung Disease  Program  Pulmonary Fibrosis [facility] [place], [place], [address]  Pager: [phone], If no answer  OR between  19:00-7:00h: page [phone] Telephone (clinical office): [phone] Telephone (research): [phone]  1:14 PM 01/11/2020

## 2020-01-12 DIAGNOSIS — J9600 Acute respiratory failure, unspecified whether with hypoxia or hypercapnia: Secondary | ICD-10-CM | POA: Diagnosis not present

## 2020-01-12 LAB — BLOOD GAS, ARTERIAL
Acid-Base Excess: 7.3 mmol/L — ABNORMAL HIGH (ref 0.0–2.0)
Acid-Base Excess: 6.9 mmol/L — ABNORMAL HIGH (ref 0.0–2.0)
Allens test (pass/fail): POSITIVE — AB
Bicarbonate: 31.2 mmol/L — ABNORMAL HIGH (ref 20.0–28.0)
Bicarbonate: 30.3 mmol/L — ABNORMAL HIGH (ref 20.0–28.0)
FIO2: 0.35
FIO2: 32
Mechanical Rate: 10
O2 Saturation: 96.6 %
O2 Saturation: 95.8 %
Patient temperature: 37
Patient temperature: 37
pCO2 arterial: 40 mmHg (ref 32.0–48.0)
pCO2 arterial: 38 mmHg (ref 32.0–48.0)
pH, Arterial: 7.5 — ABNORMAL HIGH (ref 7.350–7.450)
pH, Arterial: 7.51 — ABNORMAL HIGH (ref 7.350–7.450)
pO2, Arterial: 79 mmHg — ABNORMAL LOW (ref 83.0–108.0)
pO2, Arterial: 72 mmHg — ABNORMAL LOW (ref 83.0–108.0)

## 2020-01-12 LAB — BASIC METABOLIC PANEL
Anion gap: 10 (ref 5–15)
Anion gap: 13 (ref 5–15)
BUN: 30 mg/dL — ABNORMAL HIGH (ref 8–23)
BUN: 28 mg/dL — ABNORMAL HIGH (ref 8–23)
CO2: 28 mmol/L (ref 22–32)
CO2: 26 mmol/L (ref 22–32)
Calcium: 9.5 mg/dL (ref 8.9–10.3)
Calcium: 9.1 mg/dL (ref 8.9–10.3)
Chloride: 108 mmol/L (ref 98–111)
Chloride: 103 mmol/L (ref 98–111)
Creatinine, Ser: 0.78 mg/dL (ref 0.61–1.24)
Creatinine, Ser: 0.85 mg/dL (ref 0.61–1.24)
GFR, Estimated: >60 mL/min (ref >60)
GFR, Estimated: >60 mL/min (ref >60)
Glucose, Bld: 170 mg/dL — ABNORMAL HIGH (ref 70–99)
Glucose, Bld: 306 mg/dL — ABNORMAL HIGH (ref 70–99)
Potassium: 4.1 mmol/L (ref 3.5–5.1)
Potassium: 3.9 mmol/L (ref 3.5–5.1)
Sodium: 146 mmol/L — ABNORMAL HIGH (ref 135–145)
Sodium: 142 mmol/L (ref 135–145)

## 2020-01-12 LAB — CBC WITH DIFFERENTIAL/PLATELET
Abs Immature Granulocytes: 0.1 10*3/uL — ABNORMAL HIGH (ref 0.00–0.07)
Basophils Absolute: 0.2 10*3/uL — ABNORMAL HIGH (ref 0.0–0.1)
Basophils Relative: 2 %
Eosinophils Absolute: 0.6 10*3/uL — ABNORMAL HIGH (ref 0.0–0.5)
Eosinophils Relative: 4 %
HCT: 51.7 % (ref 39.0–52.0)
Hemoglobin: 15.9 g/dL (ref 13.0–17.0)
Immature Granulocytes: 1 %
Lymphocytes Relative: 9 %
Lymphs Abs: 1.4 10*3/uL (ref 0.7–4.0)
MCH: 20.7 pg — ABNORMAL LOW (ref 26.0–34.0)
MCHC: 30.8 g/dL (ref 30.0–36.0)
MCV: 67.3 fL — ABNORMAL LOW (ref 80.0–100.0)
Monocytes Absolute: 1.6 10*3/uL — ABNORMAL HIGH (ref 0.1–1.0)
Monocytes Relative: 10 %
Neutro Abs: 11.8 10*3/uL — ABNORMAL HIGH (ref 1.7–7.7)
Neutrophils Relative %: 74 %
Platelets: 882 10*3/uL — ABNORMAL HIGH (ref 150–400)
RBC: 7.68 MIL/uL — ABNORMAL HIGH (ref 4.22–5.81)
RDW: 25.2 % — ABNORMAL HIGH (ref 11.5–15.5)
Smear Review: NORMAL
WBC: 15.7 10*3/uL — ABNORMAL HIGH (ref 4.0–10.5)
nRBC: 0 % (ref 0.0–0.2)

## 2020-01-12 LAB — CBC
HCT: 53.1 % — ABNORMAL HIGH (ref 39.0–52.0)
Hemoglobin: 16.1 g/dL (ref 13.0–17.0)
MCH: 20.5 pg — ABNORMAL LOW (ref 26.0–34.0)
MCHC: 30.3 g/dL (ref 30.0–36.0)
MCV: 67.6 fL — ABNORMAL LOW (ref 80.0–100.0)
Platelets: 873 10*3/uL — ABNORMAL HIGH (ref 150–400)
RBC: 7.85 MIL/uL — ABNORMAL HIGH (ref 4.22–5.81)
RDW: 24.7 % — ABNORMAL HIGH (ref 11.5–15.5)
WBC: 17.5 10*3/uL — ABNORMAL HIGH (ref 4.0–10.5)
nRBC: 0 % (ref 0.0–0.2)

## 2020-01-12 LAB — CULTURE, RESPIRATORY W GRAM STAIN

## 2020-01-12 LAB — GLUCOSE, CAPILLARY
Glucose-Capillary: 135 mg/dL — ABNORMAL HIGH (ref 70–99)
Glucose-Capillary: 147 mg/dL — ABNORMAL HIGH (ref 70–99)
Glucose-Capillary: 163 mg/dL — ABNORMAL HIGH (ref 70–99)
Glucose-Capillary: 176 mg/dL — ABNORMAL HIGH (ref 70–99)
Glucose-Capillary: 178 mg/dL — ABNORMAL HIGH (ref 70–99)
Glucose-Capillary: 191 mg/dL — ABNORMAL HIGH (ref 70–99)

## 2020-01-12 LAB — PROCALCITONIN: Procalcitonin: 0.54 ng/mL

## 2020-01-12 LAB — PHOSPHORUS: Phosphorus: 3.1 mg/dL (ref 2.5–4.6)

## 2020-01-12 LAB — MAGNESIUM: Magnesium: 1.8 mg/dL (ref 1.7–2.4)

## 2020-01-12 MED ORDER — ORAL CARE MOUTH RINSE
15.0000 mL | Freq: Two times a day (BID) | OROMUCOSAL | Status: DC
  Administered 2020-01-12 – 2020-01-16 (×10): 15 mL via OROMUCOSAL

## 2020-01-12 MED ORDER — MAGNESIUM SULFATE 2 GM/50ML IV SOLN
2.0000 g | Freq: Once | INTRAVENOUS | Status: AC
  Administered 2020-01-12: 2 g via INTRAVENOUS
  Filled 2020-01-12: qty 50

## 2020-01-12 MED ORDER — CHLORHEXIDINE GLUCONATE 0.12 % MT SOLN
15.0000 mL | Freq: Two times a day (BID) | OROMUCOSAL | Status: DC
  Administered 2020-01-12 – 2020-01-22 (×16): 15 mL via OROMUCOSAL
  Filled 2020-01-12 (×12): qty 15

## 2020-01-12 MED ORDER — CEFAZOLIN SODIUM-DEXTROSE 2-4 GM/100ML-% IV SOLN
2.0000 g | Freq: Three times a day (TID) | INTRAVENOUS | Status: DC
  Administered 2020-01-12 – 2020-01-15 (×9): 2 g via INTRAVENOUS
  Filled 2020-01-12 (×11): qty 100

## 2020-01-12 NOTE — Progress Notes (Signed)
OT Cancellation Note  Patient Details Name: Travis Maxwell MRN: [id number] DOB: 1950/11/08   Cancelled Treatment:    Reason Eval/Treat Not Completed: Patient not medically ready. Per RN pt is not appropriate to attempt therapy this date. He isn't able to follow commands consistently for safe mobility. Will hold until tomorrow and re-attempt if medically stable.  Kathie Dike, M.S. OTR/L  01/12/20, 9:05 AM  ascom [phone]

## 2020-01-12 NOTE — Progress Notes (Signed)
CRITICAL CARE NOTE   Date of admit : 01/06/2020 LOS: 6 days BRIEF     Dx of COVID 9//25/212021. Patient presents to the ED 01/06/2020 -  in respiratory distress complaining of chest pain and shortness of breath. He is reporting orthopnea and increased swelling. History is limited due to acuity of condition and patient's respiratory status. Patient was placed on biPAP, patient progressive worsened and was emergently intubated.  Patient has severe metabolic acidosis +MARIJUANA . Diagnosis of with severe systolic CHF EF 20%  -history of HTN, DM, GERD, tobacco abuse, CAD s/p stenting, AVR with bioprosthetic valve, EF of20%. Paroxysmal A. fib on Eliquis.    MICRO Blood cultures 01/06/2020-negative MRSA PCR 01/06/2020: Negative Tracheal culture 01/09/2020: MSSA on 01/12/2020 cefazolin   ANTIBIOTICS Remdesivir 01/06/2020-01/06/2020 Ceftriaxone 01/07/2020-01/10/2020 Azithromycin 01/07/2020-01/11/2020 Cefepime 01/10/2020 >>01/12/20 Ancef (MSSA) 01/12/20 >>    SIGNIFICANT EVENTS 1154 EKG with concerning ischemic changes, though difficult to tell if this is rate related. First trop is slightly elevated. Working on access to provide diltiazem, then will repeat ekg  [DS]  1210 Diltiazem provided and repeat EKGs demonstrate inferior ischemic changes. When I compare this to his admission last month looks similar, though slightly worse degree of elevations. He reports improving symptoms. Aspirin provided. I paged the STEMI provider on call  [DS]  1222 Spoke with Dr. Kirke Corin, STEMI cardiologist on-call, who is reviewed EKGs and agrees that while EKG today is of worse magnitude with ischemic changes, it is not STEMI criteria at this time. He recommends management of CHF exacerbation with serial cardiac enzymes.   11/2 severe cardiogenic shock 11/2 remains on vent 11/3 severe resp failure 11/3  severe sCHF  11/4 - pient remains critically ill Prognosis is guarded +cardiogenic shokc Therapy for pneumonia  01/10/20 - has ongoing fever 101F. PCT trending down but high. On vent. Started TF yesesterday. On Miralax daily. Still constipated. Pharmacy adding senokot. On vent , fio2 32%, peep 8.  On neo gtt, versed gtt, fent gtt. R-> RASs -4 despite 50% drop.  Urine streptococcal antigen negative  11/6 - antibiotics changed to HCAP treatment yesterday and still having low-grade fever but white count improving. Remains on the ventilator.  Off sedation infusion fentanyl since the last 45 minutes to an hour.  Still drowsy but does protect his airway.  Doing spontaneous breathing trial 10/5.  Off pressors.  Lasix continues. -> EXTUBATED     SUBJECTIVE/OVERNIGHT/INTERVAL HX   01/12/2020 -remains extubated.  Still febrile with a fever 101.  White count of 15.7K; only somewhat down.  Sputum culture growing MSSA.  Still encephalopathic and drowsy.  ABG x2 shows respiratory alkalosis with metabolic alkalosis.  On and off Precedex need   seen by cardiology and the plan is to continue diuresis, Eliquis and to start metoprolol and Entresto once more stable but to defer ischemic work-up and starting amiodarone at this point in time.    BP (!) 99/55   Pulse 89   Temp 99.6 F (37.6 C) (Axillary)   Resp (!) 33   Ht 5\' 7"  (1.702 m)   Wt 70.7 kg   SpO2 91%   BMI 24.41 kg/m    I/O last 3 completed shifts: In: 2769.2 [I.V.:629.3; NG/GT:1540; IV Piggyback:599.9] Out: 6235 [Urine:6235] Total I/O In: -  Out: 1200 [Urine:1200]  SpO2: 91 % O2 Flow Rate (L/min): 2 L/min FiO2 (%): 32 %  Estimated body mass index is 24.41 kg/m as calculated from the following:   Height as of this encounter: 5\' 7"  (  1.702 m).   Weight as of this encounter: 70.7 kg.    EXAM  -Deconditioned male off the ventilator.  He is easily arousable but does not track or follow commands.  Sometimes he will moan.  He does  protect his airways.  Clear to auscultation normal heart sounds abdomen soft no sinus no clubbing edema but he does look deconditioned and disheveled.    LABS    PULMONARY Recent Labs  Lab 01/06/20 1501 01/06/20 1501 01/06/20 1955 01/08/20 0500 01/11/20 0008 01/12/20 0500 01/12/20 0800  PHART 7.15*  --  7.39 7.33*  --  7.50* 7.51*  PCO2ART 77*  --  32 39  --  40 38  PO2ART 114*  --  145* 85  --  79* 72*  HCO3 26.8  --  19.4* 20.6  --  31.2* 30.3*  O2SAT 97.0   < > 99.2 95.6 PENDING 96.6 95.8   < > = values in this interval not displayed.    CBC Recent Labs  Lab 01/10/20 0420 01/11/20 0008 01/11/20 0430  HGB 14.7 15.1 15.9  HCT 49.1 50.5 51.7  WBC 16.1* 15.1* 15.7*  PLT 802* 826* 882*    COAGULATION No results for input(s): INR in the last 168 hours.  CARDIAC  No results for input(s): TROPONINI in the last 168 hours. No results for input(s): PROBNP in the last 168 hours.   CHEMISTRY Recent Labs  Lab 01/08/20 0500 01/08/20 0500 01/08/20 1725 01/09/20 0152 01/09/20 0152 01/10/20 0420 01/10/20 0420 01/11/20 0008 01/11/20 0430  NA 139  --   --  141  --  141  --  148* 146*  K 4.4   < >  --  4.3   < > 4.8   < > 4.3 4.1  CL 109  --   --  109  --  108  --  109 108  CO2 19*  --   --  23  --  25  --  28 28  GLUCOSE 129*  --   --  164*  --  149*  --  184* 170*  BUN 25*  --   --  34*  --  41*  --  34* 30*  CREATININE 0.91  --   --  1.04  --  1.12  --  0.84 0.78  CALCIUM 7.8*  --   --  7.8*  --  9.0  --  8.9 9.5  MG 2.8*  --  2.3 2.1  --   --   --  1.7 1.8  PHOS 4.4  --  3.1 3.2  --   --   --  3.8 3.1   < > = values in this interval not displayed.   Estimated Creatinine Clearance: 83.8 mL/min (by C-G formula based on SCr of 0.78 mg/dL).   LIVER Recent Labs  Lab 01/11/20 0008  AST 21  ALT 22  ALKPHOS 49  BILITOT 0.8  PROT 7.0  ALBUMIN 2.8*     INFECTIOUS Recent Labs  Lab 01/06/20 2017 01/07/20 2034 01/08/20 0500 01/08/20 0500 01/09/20 0152  01/11/20 0008 01/11/20 0430  LATICACIDVEN 2.8*  --  1.2  --   --  1.3  --   PROCALCITON  --    < > 4.79   < > 3.51 1.09 0.54   < > = values in this interval not displayed.     ENDOCRINE CBG (last 3)  Recent Labs    01/12/20 0352 01/12/20 0724 01/12/20 1155  GLUCAP 147* 163* 191*         IMAGING x48h  - image(s) personally visualized  -   highlighted in bold DG Chest Port 1 View  Result Date: 01/11/2020 CLINICAL DATA:  Endotracheal tube present. EXAM: PORTABLE CHEST 1 VIEW COMPARISON:  Radiograph 01/09/2020 FINDINGS: Endotracheal tube tip 3.6 cm from the carina. Enteric tube in place with tip and side-port below the diaphragm. Right upper extremity PICC in place. Post median sternotomy with prosthetic cardiac valve. Stable cardiomegaly. Persistent streaky bibasilar atelectasis. Peribronchial thickening appears similar. No new airspace disease. No pneumothorax. No large pleural effusion. 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 IMPRESSION: 1. Stable support apparatus. 2. Stable cardiomegaly and peribronchial thickening. Streaky bibasilar atelectasis. Electronically Signed   By: Narda Rutherford M.D.   On: 01/11/2020 15:35          ASSESSMENT AND PLAN SYNOPSIS 69 yo [name] with severe resp failure due to severe metabolic acidosis with severe sCHF exacerbation previous h/o COVID in setting of drug abuse, therapy for CAP v HCAP   Severe ACUTE Hypoxic and Hypercapnic Respiratory Failure -status post extubation 01/11/2020   01/12/2020 - > remains extubated and protecting airway although acute encephalopathy potential risk for reintubation  plan -Oxygen for pulse ox greater than 88% -Reintubate if needed -Can temporize with BiPAP    ACUTE SYSTOLIC CARDIAC FAILURE- EF 20% Known A Fib Circulatory shock -resolved 01/11/2020 or 01/10/2020  01/12/2020 -off pressors.   Plan -   -- continue lasix -Continue scheduled Eliquis and Lopressor for A. Fib -Continue Lipitor -  goal SBP > 100 and MAP > 60 -Appreciate cardiology input -the plan is to start Lopressor and Entresto when patient is able to take p.o. but defer ischemic work-up or starting amiodarone at this time  ACUTE KIDNEY INJURY/Renal Failure  01/12/2020 -BUN and creatinine improved with Lasix.  Cardiology supporting continue diuresis.  Does have some respiratory alkalosis   plan -Might need Diamox but he mostly has respiratory alkalosis -Monitor renal function with diuresis -continue Foley Catheter-assess need -Avoid nephrotoxic agents -Follow urine output, BMP -Ensure adequate renal perfusion, optimize oxygenation -Renal dose medications    NEUROLOGY Acute toxic metabolic encephalopathy, need for sedation  01/12/2020 -still has waxing and waning acute metabolic encephalopathy.  Likely due to fever and sepsis from his new diagnosed MSSA pneumonia.  Needs appropriate antibiotic therapy  Plan -Precedex infusion as tolerated and wean as able- - goal RASS to -1 to +1   ID-concern for CAP at admission but urine strep negative. MSSA confirm 01/12/2020 from sample on 01/09/2020   01/12/2020 -still febrile despite rotating antibiotics to HCAP treatment.  Growing MSSA.  Ongoing pneumonia and fever could be reason for encephalopathy   Plan  -Change cefepime to Ancef    Best practice:  Diet: N.p.o. [meds under close supervision is okay] because of acute encephalopathy  pain/Anxiety/Delirium protocol (if indicated): as above VAP protocol (if indicated): yes DVT prophylaxis: elqiuis for A. fib GI prophylaxis: pepcid Glucose control: ssi Mobility: bed rest Code Status: full Family Communication: callled Radene Journey [phone] -- 01/11/20 and 01/12/20 updated. Disposition: icu    ATTESTATION & SIGNATURE   The patient Travis Maxwell is critically ill with multiple organ systems failure and requires high complexity decision making for assessment and support, frequent evaluation and  titration of therapies, application of advanced monitoring technologies and extensive interpretation of multiple databases.   Critical Care Time devoted to patient care services described in this note is  80  Minutes. This time reflects time of care of this signee Dr Kalman Shan. This critical care time does not reflect procedure time, or teaching time or supervisory time of PA/NP/Med student/Med Resident etc but could involve care discussion time     Dr. Kalman Shan, M.D., [facility].C.P Pulmonary and Critical Care Medicine Staff Physician [facility] [place] [facility] Pager: [phone], If no answer or between  15:00h - 7:00h: call [phone]  01/12/2020 2:22 PM

## 2020-01-12 NOTE — Progress Notes (Signed)
[facility] Cardiology  SUBJECTIVE: Patient extubated, awake, peers confused,not answering questions   Vitals:   01/12/20 0410 01/12/20 0500 01/12/20 0700 01/12/20 0800  BP:  134/79 112/85 (!) 127/92  Pulse:  81 85 87  Resp:  (!) 21 (!) 28 (!) 26  Temp:  (!) 100.9 F (38.3 C) (!) 101.1 F (38.4 C)   TempSrc:   Bladder   SpO2: 96% 96% 95% 97%  Weight:  70.7 kg    Height:         Intake/Output Summary (Last 24 hours) at 01/12/2020 0951 Last data filed at 01/12/2020 1610 Gross per 24 hour  Intake 1853.63 ml  Output 3985 ml  Net -2131.37 ml      PHYSICAL EXAM  General: Well developed, well nourished, in no acute distress HEENT:  Normocephalic and atramatic Neck:  No JVD.  Lungs: Clear bilaterally to auscultation and percussion. Heart: HRRR . Normal S1 and S2 without gallops or murmurs.  Abdomen: Bowel sounds are positive, abdomen soft and non-tender  Msk:  Back normal, normal gait. Normal strength and tone for age. Extremities: No clubbing, cyanosis or edema.   Neuro: Alert and oriented X 3. Psych:  Good affect, responds appropriately   LABS: Basic Metabolic Panel: Recent Labs    01/11/20 0008 01/11/20 0430  NA 148* 146*  K 4.3 4.1  CL 109 108  CO2 28 28  GLUCOSE 184* 170*  BUN 34* 30*  CREATININE 0.84 0.78  CALCIUM 8.9 9.5  MG 1.7 1.8  PHOS 3.8 3.1   Liver Function Tests: Recent Labs    01/11/20 0008  AST 21  ALT 22  ALKPHOS 49  BILITOT 0.8  PROT 7.0  ALBUMIN 2.8*   No results for input(s): LIPASE, AMYLASE in the last 72 hours. CBC: Recent Labs    01/11/20 0008 01/11/20 0430  WBC 15.1* 15.7*  NEUTROABS 10.7* 11.8*  HGB 15.1 15.9  HCT 50.5 51.7  MCV 68.3* 67.3*  PLT 826* 882*   Cardiac Enzymes: No results for input(s): CKTOTAL, CKMB, CKMBINDEX, TROPONINI in the last 72 hours. BNP: Invalid input(s): POCBNP D-Dimer: No results for input(s): DDIMER in the last 72 hours. Hemoglobin A1C: No results for input(s): HGBA1C in the last 72  hours. Fasting Lipid Panel: No results for input(s): CHOL, HDL, LDLCALC, TRIG, CHOLHDL, LDLDIRECT in the last 72 hours. Thyroid Function Tests: No results for input(s): TSH, T4TOTAL, T3FREE, THYROIDAB in the last 72 hours.  Invalid input(s): FREET3 Anemia Panel: No results for input(s): VITAMINB12, FOLATE, FERRITIN, TIBC, IRON, RETICCTPCT in the last 72 hours.  DG Chest Port 1 View  Result Date: 01/11/2020 CLINICAL DATA:  Endotracheal tube present. EXAM: PORTABLE CHEST 1 VIEW COMPARISON:  Radiograph 01/09/2020 FINDINGS: Endotracheal tube tip 3.6 cm from the carina. Enteric tube in place with tip and side-port below the diaphragm. Right upper extremity PICC in place. Post median sternotomy with prosthetic cardiac valve. Stable cardiomegaly. Persistent streaky bibasilar atelectasis. Peribronchial thickening appears similar. No new airspace disease. No pneumothorax. No large pleural effusion. 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 IMPRESSION: 1. Stable support apparatus. 2. Stable cardiomegaly and peribronchial thickening. Streaky bibasilar atelectasis. Electronically Signed   By: Narda Rutherford M.D.   On: 01/11/2020 15:35     Echo LVEF less than 20% by 2D echocardiogram 08/13/2019  TELEMETRY: Sinus rhythm:  ASSESSMENT AND PLAN:  Active Problems:   Respiratory failure (HCC)   Malnutrition of moderate degree    1. Acute on chronic systolic CHF, with LVEF <20%, currently on vasopressor,  and IV Lasix BID. BNP 1046. Chest xray with improved pulmonary edema since prior study with chronic pulmonary venous hypertension present. Questionable medical non-adherence. 2. Coronary artery disease, status post NSTEMI 07/2017, status post DES to mid RCA; patient presented with chest pain. Troponin borderline elevated71->91->89 (191 one month prior), most consistent with myocardial injury secondary to respiratory failure   (ICD10 I5A ) 3. Paroxsymal atrial fibrillation, on amiodarone at home, and  Eliquis for stroke prevention. Currently in sinus rhythm off amiodarone. 4. Frequent premature ventricular contractions 5. Aortic stenosis, status post Edwards magna tissue valve in 03/2018. Most recent echocardiogram 08/2019 shows mild aortic regurgitation 6. Tobacco and marijuana use  Recommendations  1.  Agree with current therapy 2.  Continue diuresis 3.  Carefully monitor renal status 4.  Continue Eliquis for stroke prevention 5.  Defer ischemic work-up at this time, troponin borderline elevated, without evidence for ischemia, most consistent with myocardial injury (ICD 10 I5A) secondary to respiratory failure 6.  Defer resuming amiodarone at this time 7.  Resume metoprolol succinate and Entresto when safely taken p.o. medications   Marcina Millard, MD, PhD, [facility] 01/12/2020 9:51 AM

## 2020-01-12 NOTE — Progress Notes (Signed)
Pharmacy Antibiotic Note  Travis Maxwell is a 69 y.o. male admitted on 01/06/2020 complaining of chest pain and SOB. PMH for HTN, DM, GERD, tobacco abuse, CAD s/p stenting, AVR with bioprosthetic valve, paroxysmal afib (on eliquis). Patient intubated 11/1. Patient completed 3 days of azithromycin and ceftriaxone. Patient was switched to cefepime; sputum culture growing MSSA. WBC, lactic acid, and procalcitonin trending down. Patient continues to have fevers. Pharmacy has been consulted for cefazolin dosing for CAP.   11/3 CXR: Probable mild pulmonary edema and small LEFT pleural effusion 11/4 CXR: Low volume film with persistent retrocardiac atelectasis/infiltrate 11/6 CXR: Peribronchial thickening. Streaky bibasilar atelectasis  Plan: Start cefazolin 2g IV q8h Monitor renal function and adjust dose as clinically indicated  Height: 5\' 7"  (170.2 cm) Weight: 70.7 kg (155 lb 13.8 oz) IBW/kg (Calculated) : 66.1  Temp (24hrs), Avg:100.6 F (38.1 C), Min:99.6 F (37.6 C), Max:101.1 F (38.4 C)  Recent Labs  Lab 01/06/20 1156 01/06/20 2017 01/06/20 2250 01/08/20 0500 01/09/20 0152 01/10/20 0420 01/11/20 0008 01/11/20 0430  WBC  --   --    < > 16.7* 18.4* 16.1* 15.1* 15.7*  CREATININE  --   --    < > 0.91 1.04 1.12 0.84 0.78  LATICACIDVEN 7.7* 2.8*  --  1.2  --   --  1.3  --    < > = values in this interval not displayed.    Estimated Creatinine Clearance: 83.8 mL/min (by C-G formula based on SCr of 0.78 mg/dL).    Allergies  Allergen Reactions  . Penicillins Other (See Comments)    Has patient had a PCN reaction causing immediate rash, facial/tongue/throat swelling, SOB or lightheadedness with hypotension: Unknown Has patient had a PCN reaction causing severe rash involving mucus membranes or skin necrosis: Unknown Has patient had a PCN reaction that required hospitalization: Unknown Has patient had a PCN reaction occurring within the last 10 years: No If all of the above answers  are "NO", then may proceed with Cephalosporin use.     Antimicrobials this admission: Azithromycin 11/3 >> 11/5 Ceftriaxone 11/2 >> 11/4 Cefepime 11/5 >> 11/7 Cefazolin 11/7 >>  Microbiology results: 11/1 BCx: negative 11/4 Sputum: MSSA 11/1 MRSA PCR: negative  Thank you for allowing pharmacy to be a part of this patient's care.  13/1, PharmD Pharmacy Resident  01/12/2020 2:38 PM

## 2020-01-12 NOTE — Progress Notes (Signed)
PT Cancellation Note  Patient Details Name: Travis Maxwell MRN: [id number] DOB: 10-09-1950   Cancelled Treatment:    Reason Eval/Treat Not Completed: Other (comment). Per discussion with RN and visualization of patient, he is not appropriate to attempt physical therapy this date. He isn't able to follow commands consistently for safe mobility. Will hold until tomorrow and re-attempt if medically stable.   [name] 01/12/2020, 8:51 AM Elizabeth Palau, PT, DPT [phone]

## 2020-01-12 NOTE — Progress Notes (Signed)
Attempted to decrease precedex drip per Lynnda Child NP request. I decreased dose to .4 at 2027 on 11/06, at first patient remained calm and sufficiently sedated, but then he started attempting to exit the bed and remove bipap at about 0244, so I increased dose back to 0.8 to keep patient sedated enough to tolerate bipap.

## 2020-01-13 ENCOUNTER — Other Ambulatory Visit: Payer: Self-pay

## 2020-01-13 DIAGNOSIS — J9601 Acute respiratory failure with hypoxia: Secondary | ICD-10-CM | POA: Diagnosis not present

## 2020-01-13 DIAGNOSIS — I5021 Acute systolic (congestive) heart failure: Secondary | ICD-10-CM | POA: Diagnosis not present

## 2020-01-13 LAB — LEGIONELLA PNEUMOPHILA SEROGP 1 UR AG: L. pneumophila Serogp 1 Ur Ag: NEGATIVE

## 2020-01-13 LAB — BASIC METABOLIC PANEL
Anion gap: 11 (ref 5–15)
BUN: 29 mg/dL — ABNORMAL HIGH (ref 8–23)
CO2: 30 mmol/L (ref 22–32)
Calcium: 9.9 mg/dL (ref 8.9–10.3)
Chloride: 108 mmol/L (ref 98–111)
Creatinine, Ser: 0.9 mg/dL (ref 0.61–1.24)
GFR, Estimated: >60 mL/min (ref >60)
Glucose, Bld: 149 mg/dL — ABNORMAL HIGH (ref 70–99)
Potassium: 3.7 mmol/L (ref 3.5–5.1)
Sodium: 149 mmol/L — ABNORMAL HIGH (ref 135–145)

## 2020-01-13 LAB — GLUCOSE, CAPILLARY
Glucose-Capillary: 141 mg/dL — ABNORMAL HIGH (ref 70–99)
Glucose-Capillary: 166 mg/dL — ABNORMAL HIGH (ref 70–99)
Glucose-Capillary: 184 mg/dL — ABNORMAL HIGH (ref 70–99)
Glucose-Capillary: 199 mg/dL — ABNORMAL HIGH (ref 70–99)
Glucose-Capillary: 200 mg/dL — ABNORMAL HIGH (ref 70–99)
Glucose-Capillary: 207 mg/dL — ABNORMAL HIGH (ref 70–99)

## 2020-01-13 LAB — CBC WITH DIFFERENTIAL/PLATELET
Abs Immature Granulocytes: 0.24 10*3/uL — ABNORMAL HIGH (ref 0.00–0.07)
Basophils Absolute: 0.2 10*3/uL — ABNORMAL HIGH (ref 0.0–0.1)
Basophils Relative: 1 %
Eosinophils Absolute: 0.4 10*3/uL (ref 0.0–0.5)
Eosinophils Relative: 2 %
HCT: 50.6 % (ref 39.0–52.0)
Hemoglobin: 15.5 g/dL (ref 13.0–17.0)
Immature Granulocytes: 1 %
Lymphocytes Relative: 8 %
Lymphs Abs: 1.6 10*3/uL (ref 0.7–4.0)
MCH: 20.2 pg — ABNORMAL LOW (ref 26.0–34.0)
MCHC: 30.6 g/dL (ref 30.0–36.0)
MCV: 66 fL — ABNORMAL LOW (ref 80.0–100.0)
Monocytes Absolute: 2.1 10*3/uL — ABNORMAL HIGH (ref 0.1–1.0)
Monocytes Relative: 11 %
Neutro Abs: 14.9 10*3/uL — ABNORMAL HIGH (ref 1.7–7.7)
Neutrophils Relative %: 77 %
Platelets: 789 10*3/uL — ABNORMAL HIGH (ref 150–400)
RBC: 7.67 MIL/uL — ABNORMAL HIGH (ref 4.22–5.81)
RDW: 24.6 % — ABNORMAL HIGH (ref 11.5–15.5)
WBC: 21.3 10*3/uL — ABNORMAL HIGH (ref 4.0–10.5)
nRBC: 0.1 % (ref 0.0–0.2)

## 2020-01-13 LAB — PHOSPHORUS
Phosphorus: 3 mg/dL (ref 2.5–4.6)
Phosphorus: 2.9 mg/dL (ref 2.5–4.6)

## 2020-01-13 LAB — POTASSIUM: Potassium: 3 mmol/L — ABNORMAL LOW (ref 3.5–5.1)

## 2020-01-13 LAB — BRAIN NATRIURETIC PEPTIDE: B Natriuretic Peptide: 1662.9 pg/mL — ABNORMAL HIGH (ref 0.0–100.0)

## 2020-01-13 LAB — MAGNESIUM
Magnesium: 2.3 mg/dL (ref 1.7–2.4)
Magnesium: 2.2 mg/dL (ref 1.7–2.4)

## 2020-01-13 LAB — COOXEMETRY PANEL
Carboxyhemoglobin: 2.2 % — ABNORMAL HIGH (ref 0.5–1.5)
Methemoglobin: 1.4 % (ref 0.0–1.5)

## 2020-01-13 MED ORDER — AMIODARONE IV BOLUS ONLY 150 MG/100ML: 150.0000 mg | Freq: Once | INTRAVENOUS | Status: AC

## 2020-01-13 MED ORDER — THIAMINE HCL 100 MG/ML IJ SOLN
500.0000 mg | Freq: Three times a day (TID) | INTRAVENOUS | Status: AC
  Administered 2020-01-13 – 2020-01-16 (×9): 500 mg via INTRAVENOUS
  Filled 2020-01-13 (×9): qty 5

## 2020-01-13 MED ORDER — DEXMEDETOMIDINE HCL-DEXTROSE 400MCG/100ML -5% IV SOLN
0.4000 ug/kg/h | INTRAVENOUS | Status: DC
  Administered 2020-01-13 (×2): 1 ug/kg/h via INTRAVENOUS
  Administered 2020-01-13: 0.9 ug/kg/h via INTRAVENOUS
  Filled 2020-01-13 (×3): qty 100

## 2020-01-13 MED ORDER — ASPIRIN EC 81 MG PO TBEC
81.0000 mg | DELAYED_RELEASE_TABLET | Freq: Every day | ORAL | Status: DC
  Administered 2020-01-15 – 2020-01-20 (×4): 81 mg via ORAL
  Filled 2020-01-13 (×4): qty 1

## 2020-01-13 MED ORDER — DEXMEDETOMIDINE PEDIATRIC BOLUS VIA INFUSION: 0.2000 ug/kg | INTRAVENOUS | Status: DC | PRN

## 2020-01-13 MED ORDER — PHENYLEPHRINE CONCENTRATED 100MG/250ML (0.4 MG/ML) INFUSION SIMPLE
0.0000 ug/min | INTRAVENOUS | Status: DC
  Administered 2020-01-13: 20 ug/min via INTRAVENOUS
  Filled 2020-01-13: qty 250

## 2020-01-13 MED ORDER — MAGNESIUM SULFATE 2 GM/50ML IV SOLN: 2.0000 g | Freq: Once | INTRAVENOUS | Status: AC

## 2020-01-13 MED ORDER — AMIODARONE HCL IN DEXTROSE 360-4.14 MG/200ML-% IV SOLN
30.0000 mg/h | INTRAVENOUS | Status: DC
  Administered 2020-01-13 – 2020-01-14 (×2): 30 mg/h via INTRAVENOUS
  Filled 2020-01-13 (×2): qty 200

## 2020-01-13 MED ORDER — MORPHINE SULFATE (PF) 2 MG/ML IV SOLN: 2.0000 mg | Freq: Once | INTRAVENOUS | Status: AC

## 2020-01-13 MED ORDER — SENNOSIDES-DOCUSATE SODIUM 8.6-50 MG PO TABS
1.0000 | ORAL_TABLET | Freq: Two times a day (BID) | ORAL | Status: DC
  Administered 2020-01-14 – 2020-01-20 (×6): 1 via ORAL
  Filled 2020-01-13 (×6): qty 1

## 2020-01-13 MED ORDER — ATORVASTATIN CALCIUM 20 MG PO TABS
40.0000 mg | ORAL_TABLET | Freq: Every day | ORAL | Status: DC
  Administered 2020-01-14 – 2020-01-19 (×5): 40 mg via ORAL
  Filled 2020-01-13 (×5): qty 2

## 2020-01-13 MED ORDER — AMIODARONE HCL IN DEXTROSE 360-4.14 MG/200ML-% IV SOLN
INTRAVENOUS | Status: AC
  Administered 2020-01-13: 60 mg/h via INTRAVENOUS
  Filled 2020-01-13: qty 200

## 2020-01-13 MED ORDER — POTASSIUM CHLORIDE 10 MEQ/50ML IV SOLN
10.0000 meq | INTRAVENOUS | Status: AC
  Administered 2020-01-13 – 2020-01-14 (×4): 10 meq via INTRAVENOUS
  Filled 2020-01-13 (×4): qty 50

## 2020-01-13 MED ORDER — POLYETHYLENE GLYCOL 3350 17 G PO PACK: 17.0000 g | PACK | Freq: Every day | ORAL | Status: DC | PRN

## 2020-01-13 MED ORDER — ALLOPURINOL 300 MG PO TABS
300.0000 mg | ORAL_TABLET | Freq: Every day | ORAL | Status: DC
  Administered 2020-01-15 – 2020-01-20 (×3): 300 mg via ORAL
  Filled 2020-01-13 (×3): qty 1

## 2020-01-13 MED ORDER — METOPROLOL TARTRATE 5 MG/5ML IV SOLN
5.0000 mg | Freq: Three times a day (TID) | INTRAVENOUS | Status: DC | PRN
  Administered 2020-01-16: 2.5 mg via INTRAVENOUS
  Filled 2020-01-13: qty 5

## 2020-01-13 MED ORDER — AMIODARONE HCL IN DEXTROSE 360-4.14 MG/200ML-% IV SOLN: 60.0000 mg/h | INTRAVENOUS | Status: AC

## 2020-01-13 MED ORDER — POLYETHYLENE GLYCOL 3350 17 G PO PACK
17.0000 g | PACK | Freq: Every day | ORAL | Status: DC
  Administered 2020-01-20: 17 g via ORAL
  Filled 2020-01-13: qty 1

## 2020-01-13 MED ORDER — DEXMEDETOMIDINE PEDIATRIC BOLUS VIA INFUSION: 0.3000 ug/kg | INTRAVENOUS | Status: DC | PRN

## 2020-01-13 MED ORDER — MAGNESIUM SULFATE 2 GM/50ML IV SOLN
INTRAVENOUS | Status: AC
  Administered 2020-01-13: 2 g via INTRAVENOUS
  Filled 2020-01-13: qty 50

## 2020-01-13 MED ORDER — METOPROLOL TARTRATE 5 MG/5ML IV SOLN
INTRAVENOUS | Status: AC
  Administered 2020-01-13: 5 mg via INTRAVENOUS
  Filled 2020-01-13: qty 5

## 2020-01-13 MED ORDER — MORPHINE SULFATE (PF) 2 MG/ML IV SOLN
INTRAVENOUS | Status: AC
  Administered 2020-01-13: 2 mg via INTRAVENOUS
  Filled 2020-01-13: qty 1

## 2020-01-13 MED ORDER — OLANZAPINE 10 MG IM SOLR
2.5000 mg | Freq: Once | INTRAMUSCULAR | Status: AC | PRN
  Administered 2020-01-13: 2.5 mg via INTRAMUSCULAR
  Filled 2020-01-13: qty 10

## 2020-01-13 MED ORDER — APIXABAN 5 MG PO TABS
5.0000 mg | ORAL_TABLET | Freq: Two times a day (BID) | ORAL | Status: DC
  Administered 2020-01-14 – 2020-01-20 (×9): 5 mg via ORAL
  Filled 2020-01-13 (×9): qty 1

## 2020-01-13 MED ORDER — DEXMEDETOMIDINE HCL IN NACL 400 MCG/100ML IV SOLN
0.4000 ug/kg/h | INTRAVENOUS | Status: DC
  Administered 2020-01-13: 0.9 ug/kg/h via INTRAVENOUS
  Administered 2020-01-14: 0.6 ug/kg/h via INTRAVENOUS
  Administered 2020-01-14: 0.4 ug/kg/h via INTRAVENOUS
  Administered 2020-01-15: 0.6 ug/kg/h via INTRAVENOUS
  Administered 2020-01-15: 0.4 ug/kg/h via INTRAVENOUS
  Administered 2020-01-16: 1.2 ug/kg/h via INTRAVENOUS
  Administered 2020-01-17: 0.6 ug/kg/h via INTRAVENOUS
  Administered 2020-01-17: 0.9 ug/kg/h via INTRAVENOUS
  Administered 2020-01-17: 0.8 ug/kg/h via INTRAVENOUS
  Administered 2020-01-17: 0.4 ug/kg/h via INTRAVENOUS
  Administered 2020-01-18: 1.2 ug/kg/h via INTRAVENOUS
  Administered 2020-01-18: 1 ug/kg/h via INTRAVENOUS
  Administered 2020-01-18 – 2020-01-19 (×2): 0.8 ug/kg/h via INTRAVENOUS
  Administered 2020-01-19: 1 ug/kg/h via INTRAVENOUS
  Administered 2020-01-20: 0.4 ug/kg/h via INTRAVENOUS
  Filled 2020-01-13 (×16): qty 100

## 2020-01-13 MED ORDER — MIDAZOLAM HCL 2 MG/2ML IJ SOLN
INTRAMUSCULAR | Status: AC
  Administered 2020-01-13: 2 mg via INTRAVENOUS
  Filled 2020-01-13: qty 2

## 2020-01-13 MED ORDER — FENTANYL CITRATE (PF) 100 MCG/2ML IJ SOLN: 50.0000 ug | Freq: Once | INTRAMUSCULAR | Status: DC | PRN

## 2020-01-13 MED ORDER — AMIODARONE IV BOLUS ONLY 150 MG/100ML
INTRAVENOUS | Status: AC
  Administered 2020-01-13: 150 mg via INTRAVENOUS
  Filled 2020-01-13: qty 100

## 2020-01-13 MED ORDER — DEXTROSE 5 % IV SOLN: INTRAVENOUS | Status: DC

## 2020-01-13 MED ORDER — MIDAZOLAM HCL 2 MG/2ML IJ SOLN: 2.0000 mg | Freq: Once | INTRAMUSCULAR | Status: AC

## 2020-01-13 NOTE — Progress Notes (Signed)
[facility] Cardiology    SUBJECTIVE: Patient remains extubated and is on 2L nasal cannula. He is awake but drowsy and not answering questions. Unable to obtain history and ROS.   Vitals:   01/13/20 0400 01/13/20 0500 01/13/20 0700 01/13/20 0758  BP: 129/77 (!) 120/95    Pulse: 93 (!) 54 (!) 51 74  Resp: (!) 26 (!) 34 (!) 34 (!) 30  Temp:    98.8 F (37.1 C)  TempSrc:    Oral  SpO2: 94% 93% 94% 93%  Weight:      Height:         Intake/Output Summary (Last 24 hours) at 01/13/2020 3785 Last data filed at 01/13/2020 0548 Gross per 24 hour  Intake 512.06 ml  Output 2025 ml  Net -1512.94 ml      PHYSICAL EXAM  General: Well developed, well nourished, in no acute distress HEENT:  Normocephalic and atramatic Neck:  No JVD.  Lungs: Clear bilaterally to auscultation. Heart: HRRR . Normal S1 and S2 without gallops or murmurs.  Abdomen: Nondistended Extremities: No clubbing, cyanosis or edema.   Neuro: Alert. Droswy, not responding to questions.   LABS: Basic Metabolic Panel: Recent Labs    01/11/20 0430 01/11/20 0430 01/12/20 1418 01/13/20 0535  NA 146*   < > 142 149*  K 4.1   < > 3.9 3.7  CL 108   < > 103 108  CO2 28   < > 26 30  GLUCOSE 170*   < > 306* 149*  BUN 30*   < > 28* 29*  CREATININE 0.78   < > 0.85 0.90  CALCIUM 9.5   < > 9.1 9.9  MG 1.8  --   --  2.3  PHOS 3.1  --   --  3.0   < > = values in this interval not displayed.   Liver Function Tests: Recent Labs    01/11/20 0008  AST 21  ALT 22  ALKPHOS 49  BILITOT 0.8  PROT 7.0  ALBUMIN 2.8*   No results for input(s): LIPASE, AMYLASE in the last 72 hours. CBC: Recent Labs    01/11/20 0430 01/11/20 0430 01/12/20 1418 01/13/20 0535  WBC 15.7*   < > 17.5* 21.3*  NEUTROABS 11.8*  --   --  PENDING  HGB 15.9   < > 16.1 15.5  HCT 51.7   < > 53.1* 50.6  MCV 67.3*   < > 67.6* 66.0*  PLT 882*   < > 873* 789*   < > = values in this interval not displayed.   Cardiac Enzymes: No results for input(s):  CKTOTAL, CKMB, CKMBINDEX, TROPONINI in the last 72 hours. BNP: Invalid input(s): POCBNP D-Dimer: No results for input(s): DDIMER in the last 72 hours. Hemoglobin A1C: No results for input(s): HGBA1C in the last 72 hours. Fasting Lipid Panel: No results for input(s): CHOL, HDL, LDLCALC, TRIG, CHOLHDL, LDLDIRECT in the last 72 hours. Thyroid Function Tests: No results for input(s): TSH, T4TOTAL, T3FREE, THYROIDAB in the last 72 hours.  Invalid input(s): FREET3 Anemia Panel: No results for input(s): VITAMINB12, FOLATE, FERRITIN, TIBC, IRON, RETICCTPCT in the last 72 hours.  No results found.   Echo LVEF less than 20% by 2D echocardiogram 08/13/2019  TELEMETRY: Sinus tachycardia with frequent ectopy  ASSESSMENT AND PLAN:  Active Problems:   Respiratory failure (HCC)   Malnutrition of moderate degree    1. Acute on chronic systolic CHF with LVEF <20% currently on Precedex and IV Lasix BID.  BNP 1046. BUN 29 and creatinine 0.9. Chest xray with improved pulmonary edema since prior study with chronic pulmonary venous hypertension present. Questionable medical non-adherence.  2. Coronary artery disease status post NSTEMI 07/2017, status post DES to mid RCA. Patient presented with chest pain. Troponin borderline elevated and down trending (71--> 91--> 89), most consistent with myocardial injury secondary to respiratory failure.  3. Paroxysmal atrial fibrillation on amiodarone at home and Eliquis for stroke prevention. Currently in sinus rhythm with ectopy off of amiodarone. 4. Frequent premature ventricular contractions 5. Aortic stenosis, status post Edwards magna tissue valve in 03/2018. Most recent echocardiogram 08/2019 shows mild aortic regurgitation. 6. Leukocytosis WBC increasing (15.7 -> 17.5 -> 21.3). Patient is on IV antibiotics and afebrile (98.8 F). 7. Tobacco and marijuana use    Recommendations: 1. Agree with current therapy 2. Continue diuresis 3. Carefully monitor renal  status 4. Continue Eliquis for stroke preventions 5. Defer ischemic work-up at this time as troponin is borderline elevated, without evidence for ischemia, most consistent with myocardial injury secondary to respiratory failure 6. Defer resuming amiodarone at this time 7. Resume metoprolol succinate and Entresto following extubation   Leanora Ivanoff, PA-C 01/13/2020 8:52 AM

## 2020-01-13 NOTE — Progress Notes (Signed)
SLP Cancellation Note  Patient Details Name: Travis Maxwell MRN: [id number] DOB: [birth year]   Cancelled treatment:       Reason Eval/Treat Not Completed: Patient not medically ready;Fatigue/lethargy limiting ability to participate (chart reviewed; consulted NSG this AM re: pt). Pt was extubated over the weekend but has continued to exhibit confusion and agitation requiring increasing the precedex drip again early this AM per NSG report. Currently, pt is lethargic, rolling in bed at times, and unable to safely participate in BSE, po intake. Will hold on BSE per NSG request and reattempt later today if pt's status permits. NSG agreed.  Recommend frequent oral care for hygiene and stimulation of swallowing, aspiration precautions, and po meds via alternative means d/t aspiration risks. Updated NSG.      Jerilynn Som, MS, CCC-SLP Speech Language Pathologist Rehab Services [phone] [facility] 01/13/2020, 9:31 AM

## 2020-01-13 NOTE — Progress Notes (Signed)
Blood pressure has been soft, notified [name] NP. Received order for Neosynephrine. Will continue to monitor and start medication if needed.

## 2020-01-13 NOTE — Progress Notes (Signed)
PT Cancellation Note  Patient Details Name: Temple Sporer MRN: [id number] DOB: 1950-06-14   Cancelled Treatment:    Reason Eval/Treat Not Completed: Other (comment). Pt continues to not be appropriate for PT services this date. As this is 3rd consecutive attempt, will plan to dc current order. Please re-order when medically stable and able to participate in therapy.   Lachae Hohler 01/13/2020, 11:08 AM  Elizabeth Palau, PT, DPT [phone]

## 2020-01-13 NOTE — Progress Notes (Signed)
CRITICAL CARE NOTE  69 yo [name] presenting to the ED on 01/06/2020 in respiratory distress, complaining of chest pain and shortness of breath. He also reported orthopnea and increased lower extremity swelling. History was limited due to acuity of condition and respiratory status. Patient was placed on biPAP, but progressively worsened and was emergently intubated. Patient is a current 0.5ppd smoker and uses marijuana. He has a severe metabolic acidosis. Patient has a past medical history significant for HTN, T2DM, HLD, GERD, CAD (s/p PCI), AVR with bioprosthetic valve, systolic CHF with EF of 82%, and paroxysmal A. Fib, currently anticoagulated on Eliquis. EKG upon arrival to ED revealed sinus rhythm at a rate of 99bpm with RBBB and PVCs with ST elevation in II, III, avF, V4-V6. Cardiologist on call stated that STEMI criteria were not met and advised to manage CHF. Patient received IV diltiazem and reported improvement of symptoms. Labs were notable for high sensitivity troponin 71->91->89. BNP 1046, creatinine 1.26, BUN 21, potassium 5.4. CXR showed improved pulmonary edema since prior study with chronic pulmonary venous hypertension. Notably, patient was admitted to [facility] ED 12/04/2019 for acute respiratory failure and acute on chronic systolic CHF in the setting of COVID pneumonia.    SIGNIFICANT EVENTS 11/2 severe cardiogenic shock, remains on vent 11/3 severe respiratory failure, severe systolic CHF 95/6 patient remains critically ill 11/5 patient remains critically ill 11/6 off pressors, extubated to biPAP 11/7 remains extubated, febrile, MSSA+ tracheal culture - started ancef  CC  follow up respiratory failure  SUBJECTIVE Patient remains critically ill Prognosis is guarded  11/8 - remains extubated, on 2L by nasal cannula. Afebrile today with white count of 21.3, up from 15.7K yesterday. Still encephalopathic and drowsy. On and off Precedex. Went into narrow complex SVT, then progressed to wide  complex tachycardia was shocked and given a loading dose of amiodarone. Will remain on amiodarone gtt. Cardiology at bedside   BP (!) 150/93   Pulse (!) 105   Temp 98.8 F (37.1 C) (Oral)   Resp (!) 28   Ht _0  (1.702 m)   Wt 70.7 kg   SpO2 91%   BMI 24.41 kg/m    I/O last 3 completed shifts: In: 995.8 [I.V.:595.7; IV Piggyback:400.1] Out: 2130 [Urine:3425] No intake/output data recorded.  SpO2: 91 % O2 Flow Rate (L/min): 2 L/min FiO2 (%): 32 %  Estimated body mass index is 24.41 kg/m as calculated from the following:   Height as of this encounter: _1  (1.702 m).   Weight as of this encounter: 70.7 kg.    REVIEW OF SYSTEMS  PATIENT IS UNABLE TO PROVIDE COMPLETE REVIEW OF SYSTEMS DUE TO SEVERE CRITICAL ILLNESS      PHYSICAL EXAMINATION:  GENERAL:critically ill appearing,deconditioned male off the ventilator HEAD: Normocephalic, atraumatic.  EYES: Pupils equal, round, reactive to light.  No scleral icterus.  MOUTH: Moist mucosal membrane. NECK: Supple, no JVD PULMONARY: clear to auscultation bilaterally CARDIOVASCULAR: S1 and S2. Regular rate and rhythm. No murmurs, rubs, or gallops.  GASTROINTESTINAL: Soft, nontender, non-distended.  Positive bowel sounds.   MUSCULOSKELETAL: No swelling, clubbing, or edema.  NEUROLOGIC: alert, drowsy, does not track or follow commands, moans and protects airway SKIN: intact, warm, dry  MEDICATIONS: I have reviewed all medications and confirmed regimen as documented   CULTURE RESULTS   Recent Results (from the past 240 hour(s))  Blood culture (routine x 2)     Status: None   Collection Time: 01/06/20  1:13 PM   Specimen: BLOOD  Result  Value Ref Range Status   Specimen Description BLOOD BLOOD LEFT HAND  Final   Special Requests   Final    BOTTLES DRAWN AEROBIC AND ANAEROBIC Blood Culture adequate volume   Culture   Final    NO GROWTH 5 DAYS Performed at [facility], [address], [place], [place]  [address]    Report Status 01/11/2020 FINAL  Final  Blood culture (routine x 2)     Status: None   Collection Time: 01/06/20  1:26 PM   Specimen: BLOOD  Result Value Ref Range Status   Specimen Description BLOOD LEFT ANTECUBITAL  Final   Special Requests   Final    BOTTLES DRAWN AEROBIC AND ANAEROBIC Blood Culture adequate volume   Culture   Final    NO GROWTH 5 DAYS Performed at [facility], [address], [place], [place] [address]    Report Status 01/11/2020 FINAL  Final  MRSA PCR Screening     Status: None   Collection Time: 01/06/20  2:43 PM   Specimen: Nasal Mucosa; Nasopharyngeal  Result Value Ref Range Status   MRSA by PCR NEGATIVE NEGATIVE Final    Comment:        The GeneXpert MRSA Assay (FDA approved for NASAL specimens only), is one component of a comprehensive MRSA colonization surveillance program. It is not intended to diagnose MRSA infection nor to guide or monitor treatment for MRSA infections. Performed at [facility], [address] [place], [place] [address]   Culture, respiratory (non-expectorated)     Status: None   Collection Time: 01/09/20  2:45 AM   Specimen: Tracheal Aspirate; Respiratory  Result Value Ref Range Status   Specimen Description   Final    TRACHEAL ASPIRATE Performed at [facility], [address] [place], [place] [address]    Special Requests   Final    NONE Performed at [facility], [address] [place], [place] [address]    Gram Stain   Final    MODERATE WBC PRESENT, PREDOMINANTLY PMN RARE GRAM POSITIVE COCCI Performed at [facility], [place] [address] [place], [place] [address]    Culture FEW STAPHYLOCOCCUS AUREUS  Final   Report Status 01/12/2020 FINAL  Final   Organism ID, Bacteria STAPHYLOCOCCUS AUREUS  Final      Susceptibility   Staphylococcus aureus - MIC*    CIPROFLOXACIN <=0.5 SENSITIVE Sensitive     ERYTHROMYCIN >=8 RESISTANT Resistant     GENTAMICIN <=0.5  SENSITIVE Sensitive     OXACILLIN <=0.25 SENSITIVE Sensitive     TETRACYCLINE <=1 SENSITIVE Sensitive     VANCOMYCIN 1 SENSITIVE Sensitive     TRIMETH/SULFA <=10 SENSITIVE Sensitive     CLINDAMYCIN RESISTANT Resistant     RIFAMPIN <=0.5 SENSITIVE Sensitive     Inducible Clindamycin POSITIVE Resistant     * FEW STAPHYLOCOCCUS AUREUS         Intake/Output Summary (Last 24 hours) at 01/13/2020 1350 Last data filed at 01/13/2020 1225 Gross per 24 hour  Intake 1209.4 ml  Output 1500 ml  Net -290.6 ml     CBC    Component Value Date/Time   WBC 21.3 (H) 01/13/2020 0535   RBC 7.67 (H) 01/13/2020 0535   HGB 15.5 01/13/2020 0535   HCT 50.6 01/13/2020 0535   PLT 789 (H) 01/13/2020 0535   MCV 66.0 (L) 01/13/2020 0535   MCH 20.2 (L) 01/13/2020 0535   MCHC 30.6 01/13/2020 0535   RDW  24.6 (H) 01/13/2020 0535   LYMPHSABS 1.6 01/13/2020 0535   MONOABS 2.1 (H) 01/13/2020 0535   EOSABS 0.4 01/13/2020 0535   BASOSABS 0.2 (H) 01/13/2020 0535   BMP Latest Ref Rng & Units 01/13/2020 01/12/2020 01/11/2020  Glucose 70 - 99 mg/dL 149(H) 306(H) 170(H)  BUN 8 - 23 mg/dL 29(H) 28(H) 30(H)  Creatinine 0.61 - 1.24 mg/dL 0.90 0.85 0.78  Sodium 135 - 145 mmol/L 149(H) 142 146(H)  Potassium 3.5 - 5.1 mmol/L 3.7 3.9 4.1  Chloride 98 - 111 mmol/L 108 103 108  CO2 22 - 32 mmol/L _0 Calcium 8.9 - 10.3 mg/dL 9.9 9.1 9.5    Nutrition Status: Nutrition Problem: Moderate Malnutrition Etiology: chronic illness (CHF) Signs/Symptoms: moderate fat depletion, moderate muscle depletion Interventions: Tube feeding, MVI     Indwelling Urinary Catheter continued, requirement due to   Reason to continue Indwelling Urinary Catheter strict Intake/Output monitoring for hemodynamic instability   Central Line/ continued, requirement due to  Reason to continue [place] of central venous pressure or other hemodynamic parameters and poor IV access       ASSESSMENT AND PLAN SYNOPSIS 69 yo  [name] with severe respiratory failure due to severe metabolic acidosis with severe systolic CHF exacerbation, previous history of COVID-19 in the setting of drug abuse, therapy for HCAP, complicated by tachyarrhythmia and progressive heart failure  Severe ACUTE Hypoxic and Hypercapnic Respiratory Failure resp status very tenuous High risk for intubation/cardiac arrest -continue Bronchodilator Therapy   ACUTE SYSTOLIC CARDIAC FAILURE- EF 20% -oxygen as needed -Lasix as tolerated -follow up cardiac enzymes as indicated -follow up cardiology recs  NEUROLOGY -Acute toxic metabolic encephalopathy Will require CT head but unstable for transfer Will try Zyprexa for agitation Continue precedex Thiamine infusion   SHOCK-CARDIOGENIC due to arrhthymia s/p electrical cardioversion Use pressors as needed On amiodarone infusion  CARDIAC ICU monitoring  ID - therapy for HCAP -continue IV abx as prescibed -follow up cultures  GI GI PROPHYLAXIS as indicated  DIET-->NPO Constipation protocol as indicated  ENDO - will use ICU hypoglycemic\Hyperglycemia protocol if indicated    ELECTROLYTES -follow labs as needed -replace as needed -pharmacy consultation and following   DVT/GI PRX ordered and assessed TRANSFUSIONS AS NEEDED MONITOR FSBS I Assessed the need for Labs I Assessed the need for Foley I Assessed the need for Central Venous Line Family Discussion when available I Assessed the need for Mobilization I made an Assessment of medications to be adjusted accordingly Safety Risk assessment completed   CASE DISCUSSED IN MULTIDISCIPLINARY ROUNDS WITH ICU TEAM  Critical Care Time devoted to patient care services described in this note is 45 minutes.   Overall, patient is critically ill, prognosis is guarded.  Patient with Multiorgan failure and at high risk for cardiac arrest and death.   I  Would recommend DNR/DNI status Recommend Palliative care consultation  Alwin Lanigan  Patricia Pesa, M.D.  [facility]  Medical Director [place] Director [place] Department

## 2020-01-13 NOTE — Progress Notes (Signed)
OT Cancellation Note  Patient Details Name: Travis Maxwell MRN: [id number] DOB: 05-07-1950   Cancelled Treatment:    Reason Eval/Treat Not Completed: Patient not medically ready  Upon speaking with RN this AM, pt still not following commands appropriately at this time. Will f/u for OT evaluation at later date/time as appropriate. Thank you.   Rejeana Brock, MS, OTR/L ascom [phone] 01/13/20, 10:00 AM

## 2020-01-13 NOTE — Progress Notes (Signed)
Nutrition Follow-up  DOCUMENTATION CODES:   Non-severe (moderate) malnutrition in context of chronic illness  INTERVENTION:  Will monitor for diet advancement.  If diet unable to be advanced recommend placement of small-bore feeding tube for initiation of tube feeds. If plan is for tube feeds recommend: -Initiate Osmolite 1.5 Cal at 55 mL/hr (1320 mL goal daily volume) -PROSource TF 45 mL BID per tube -Goal regimen provides 2060 kcal, 105 grams of protein, 1003 mL H2O daily  NUTRITION DIAGNOSIS:   Moderate Malnutrition related to chronic illness (CHF) as evidenced by moderate fat depletion, moderate muscle depletion.  Ongoing.  GOAL:   Patient will meet greater than or equal to 90% of their needs  Not met at this time.  MONITOR:   Vent status, Labs, Weight trends, TF tolerance, I & O's  REASON FOR ASSESSMENT:   Ventilator, Consult Enteral/tube feeding initiation and management  ASSESSMENT:   69 year old male with PMHx of HTN, DM, gout, hx AVR with bioprosthetic valve, GERD, CAD, CHF (EF 20%), recent COVID-19 11/30/2019 admitted with respiratory failure, severe metabolic acidosis, CHF exacerbation.  11/1 intubated 11/6 extubated  Patient unable to answer any questions at this time. He is on Precedex gtt. Patient not appropriate for SLP evaluation at this time so he remains NPO.  Medications reviewed and include: Lasix 40 mg Q12hrs IV, Novolog 0-9 units Q4hrs, cefazolin, Precedex gtt, D5W at 75 mL/hr, famotidine, thiamine 500 mg Q8hrs IV.  Labs reviewed: CBG 166-200, Sodium 149, BUN 29.  I/O: 2125 mL UOP yesterday (1.3 mL/kg/hr) + 3 occurrences unmeasured UOP  Weight trend: 70.7 kg on 11/7; -8 kg from 11/1; unsure of accuracy of wt from 11/7  Discussed with RN and on rounds.  Diet Order:   Diet Order            Diet NPO time specified  Diet effective now                EDUCATION NEEDS:   No education needs have been identified at this time  Skin:  Skin  Assessment: Reviewed RN Assessment  Last BM:  Unknown  Height:   Ht Readings from Last 1 Encounters:  01/06/20 5' 7"  (1.702 m)   Weight:   Wt Readings from Last 1 Encounters:  01/12/20 70.7 kg   Ideal Body Weight:  67.3 kg  BMI:  Body mass index is 24.41 kg/m.  Estimated Nutritional Needs:   Kcal:  2000-2200  Protein:  100-110 grams  Fluid:  1.5-1.8 L/day or per MD  Jacklynn Barnacle, MS, RD, LDN Pager number available on Amion

## 2020-01-14 DIAGNOSIS — Z515 Encounter for palliative care: Secondary | ICD-10-CM | POA: Diagnosis not present

## 2020-01-14 DIAGNOSIS — I5021 Acute systolic (congestive) heart failure: Secondary | ICD-10-CM | POA: Diagnosis not present

## 2020-01-14 DIAGNOSIS — Z7189 Other specified counseling: Secondary | ICD-10-CM

## 2020-01-14 DIAGNOSIS — I5023 Acute on chronic systolic (congestive) heart failure: Secondary | ICD-10-CM | POA: Diagnosis not present

## 2020-01-14 DIAGNOSIS — J9601 Acute respiratory failure with hypoxia: Secondary | ICD-10-CM | POA: Diagnosis not present

## 2020-01-14 DIAGNOSIS — R627 Adult failure to thrive: Secondary | ICD-10-CM

## 2020-01-14 LAB — CBC WITH DIFFERENTIAL/PLATELET
Abs Immature Granulocytes: 0.2 10*3/uL — ABNORMAL HIGH (ref 0.00–0.07)
Basophils Absolute: 0.3 10*3/uL — ABNORMAL HIGH (ref 0.0–0.1)
Basophils Relative: 2 %
Eosinophils Absolute: 0.6 10*3/uL — ABNORMAL HIGH (ref 0.0–0.5)
Eosinophils Relative: 3 %
HCT: 54.3 % — ABNORMAL HIGH (ref 39.0–52.0)
Hemoglobin: 16.9 g/dL (ref 13.0–17.0)
Immature Granulocytes: 1 %
Lymphocytes Relative: 11 %
Lymphs Abs: 2.2 10*3/uL (ref 0.7–4.0)
MCH: 20.7 pg — ABNORMAL LOW (ref 26.0–34.0)
MCHC: 31.1 g/dL (ref 30.0–36.0)
MCV: 66.5 fL — ABNORMAL LOW (ref 80.0–100.0)
Monocytes Absolute: 1.6 10*3/uL — ABNORMAL HIGH (ref 0.1–1.0)
Monocytes Relative: 8 %
Neutro Abs: 14.3 10*3/uL — ABNORMAL HIGH (ref 1.7–7.7)
Neutrophils Relative %: 75 %
Platelets: 1013 10*3/uL (ref 150–400)
RBC: 8.16 MIL/uL — ABNORMAL HIGH (ref 4.22–5.81)
RDW: 25 % — ABNORMAL HIGH (ref 11.5–15.5)
Smear Review: NORMAL
WBC: 19.2 10*3/uL — ABNORMAL HIGH (ref 4.0–10.5)
nRBC: 0.2 % (ref 0.0–0.2)

## 2020-01-14 LAB — CBC
HCT: 52.3 % — ABNORMAL HIGH (ref 39.0–52.0)
Hemoglobin: 15.9 g/dL (ref 13.0–17.0)
MCH: 20.1 pg — ABNORMAL LOW (ref 26.0–34.0)
MCHC: 30.4 g/dL (ref 30.0–36.0)
MCV: 66.1 fL — ABNORMAL LOW (ref 80.0–100.0)
Platelets: 901 10*3/uL (ref 150–400)
RBC: 7.91 MIL/uL — ABNORMAL HIGH (ref 4.22–5.81)
RDW: 24.5 % — ABNORMAL HIGH (ref 11.5–15.5)
WBC: 17.5 10*3/uL — ABNORMAL HIGH (ref 4.0–10.5)
nRBC: 0.2 % (ref 0.0–0.2)

## 2020-01-14 LAB — BASIC METABOLIC PANEL
Anion gap: 10 (ref 5–15)
BUN: 34 mg/dL — ABNORMAL HIGH (ref 8–23)
CO2: 28 mmol/L (ref 22–32)
Calcium: 9.3 mg/dL (ref 8.9–10.3)
Chloride: 108 mmol/L (ref 98–111)
Creatinine, Ser: 1 mg/dL (ref 0.61–1.24)
GFR, Estimated: >60 mL/min (ref >60)
Glucose, Bld: 158 mg/dL — ABNORMAL HIGH (ref 70–99)
Potassium: 4.2 mmol/L (ref 3.5–5.1)
Sodium: 146 mmol/L — ABNORMAL HIGH (ref 135–145)

## 2020-01-14 LAB — GLUCOSE, CAPILLARY
Glucose-Capillary: 139 mg/dL — ABNORMAL HIGH (ref 70–99)
Glucose-Capillary: 145 mg/dL — ABNORMAL HIGH (ref 70–99)
Glucose-Capillary: 152 mg/dL — ABNORMAL HIGH (ref 70–99)
Glucose-Capillary: 177 mg/dL — ABNORMAL HIGH (ref 70–99)
Glucose-Capillary: 184 mg/dL — ABNORMAL HIGH (ref 70–99)
Glucose-Capillary: 188 mg/dL — ABNORMAL HIGH (ref 70–99)

## 2020-01-14 LAB — MAGNESIUM: Magnesium: 2.2 mg/dL (ref 1.7–2.4)

## 2020-01-14 MED ORDER — QUETIAPINE FUMARATE 25 MG PO TABS: 50.0000 mg | ORAL_TABLET | Freq: Every day | ORAL | Status: DC

## 2020-01-14 MED ORDER — AMIODARONE HCL 200 MG PO TABS: 400.0000 mg | ORAL_TABLET | Freq: Two times a day (BID) | ORAL | Status: DC

## 2020-01-14 MED ORDER — INSULIN ASPART 100 UNIT/ML ~~LOC~~ SOLN
0.0000 [IU] | Freq: Three times a day (TID) | SUBCUTANEOUS | Status: DC
  Administered 2020-01-14: 1 [IU] via SUBCUTANEOUS
  Administered 2020-01-15: 2 [IU] via SUBCUTANEOUS
  Administered 2020-01-15 (×2): 1 [IU] via SUBCUTANEOUS
  Administered 2020-01-16 (×2): 2 [IU] via SUBCUTANEOUS
  Administered 2020-01-17: 1 [IU] via SUBCUTANEOUS
  Administered 2020-01-17: 5 [IU] via SUBCUTANEOUS
  Administered 2020-01-17 – 2020-01-18 (×3): 2 [IU] via SUBCUTANEOUS
  Administered 2020-01-18: 3 [IU] via SUBCUTANEOUS
  Administered 2020-01-18: 5 [IU] via SUBCUTANEOUS
  Administered 2020-01-19 (×2): 2 [IU] via SUBCUTANEOUS
  Administered 2020-01-19: 1 [IU] via SUBCUTANEOUS
  Filled 2020-01-14 (×16): qty 1

## 2020-01-14 MED ORDER — AMIODARONE HCL IN DEXTROSE 360-4.14 MG/200ML-% IV SOLN
30.0000 mg/h | INTRAVENOUS | Status: DC
  Administered 2020-01-14: 30 mg/h via INTRAVENOUS
  Filled 2020-01-14: qty 200

## 2020-01-14 MED ORDER — DIAZEPAM 2 MG PO TABS
2.0000 mg | ORAL_TABLET | Freq: Four times a day (QID) | ORAL | Status: DC | PRN
  Administered 2020-01-16 – 2020-01-17 (×4): 2 mg via ORAL
  Filled 2020-01-14 (×8): qty 1

## 2020-01-14 MED ORDER — AMIODARONE HCL 200 MG PO TABS
400.0000 mg | ORAL_TABLET | Freq: Two times a day (BID) | ORAL | Status: DC
  Administered 2020-01-14 – 2020-01-16 (×4): 400 mg via ORAL
  Filled 2020-01-14 (×5): qty 2

## 2020-01-14 MED ORDER — DIAZEPAM 5 MG/ML IJ SOLN
2.5000 mg | Freq: Four times a day (QID) | INTRAMUSCULAR | Status: DC
  Administered 2020-01-14: 2.5 mg via INTRAVENOUS
  Filled 2020-01-14: qty 2

## 2020-01-14 MED ORDER — FUROSEMIDE 10 MG/ML IJ SOLN
40.0000 mg | Freq: Two times a day (BID) | INTRAMUSCULAR | Status: DC
  Administered 2020-01-15 – 2020-01-17 (×5): 40 mg via INTRAVENOUS
  Filled 2020-01-14 (×5): qty 4

## 2020-01-14 NOTE — Progress Notes (Signed)
Still demanding water, have reinforced that the patient cannot have water at this time due to his NPO status and not being able to swallow without coughing. States he does not care and wants to go home and have some water. This nurse suggested that the patient discuss this with his doctor and his family, that I could not provide water at this time.

## 2020-01-14 NOTE — Progress Notes (Signed)
CRITICAL CARE NOTE  [name] presenting to the ED on 01/06/2020 in respiratory distress, complaining of chest pain and shortness of breath. He also reported orthopnea and increased lower extremity swelling. History was limited due to acuity of condition and respiratory status. Patient was placed on biPAP, but progressively worsened and was emergently intubated. Patient is a current 0.5ppd smoker and uses marijuana. He has a severe metabolic acidosis. Patient has a past medical history significant for HTN, T2DM, HLD, GERD, CAD (s/p PCI), AVR with bioprosthetic valve, systolic CHF with EF of 07%, and paroxysmal A. Fib, currently anticoagulated on Eliquis. EKG upon arrival to ED revealed sinus rhythm at a rate of 99bpm with RBBB and PVCs with ST elevation in II, III, avF, V4-V6. Cardiologist on call stated that STEMI criteria were not met and advised to manage CHF. Patient received IV diltiazem and reported improvement of symptoms. Labs were notable for high sensitivity troponin 71->91->89. BNP 1046, creatinine 1.26, BUN 21, potassium 5.4. CXR showed improved pulmonary edema since prior study with chronic pulmonary venous hypertension. Notably, patient was admitted to [facility] ED 12/04/2019 for acute respiratory failure and acute on chronic systolic CHF in the setting of COVID pneumonia.   SIGNIFICANT EVENTS 11/2 - severe cardiogenic shock, remains on vent 11/3 - severe respiratory failure, severe systolic CHF 37/1 - patient remains critically ill 11/5 - patient remains critically ill 11/6 - off pressors, extubated to biPAP 11/7 - remains extubated, febrile, MSSA+ tracheal culture - started ancef 11/8 - remains extubated, started amiodarone drip for tachyarrhythmia, cardiology consulted  CC  Follow up respiratory failure  SUBJECTIVE Patient remains critically ill Prognosis is guarded  11/8 - remains extubated, on 2L by nasal cannula. Afebrile today with white count of 21.3K, up from 15.7K yesterday.  Still encephalopathic and drowsy. On and off Precedex. Went into narrow complex SVT, then progressed to wide complex tachycardia was shocked and given a loading dose of amiodarone. Will remain on amiodarone gtt. Cardiology at bedside.   11/9 - remains extubated, on 2L by nasal cannula. Afebrile today with white count of 19.2K, down since yesterday. BP soft overnight, was started on Neo-synephrine infusion. More alert than yesterday, but only oriented to self. Failed Yale 3oz swallow study early this morning, started coughing at the end of the study. Will need to remain NPO until cleared by SLP. Patient has been educated on this, yet continues to incessantly ask for water.   VITALS BP 97/64   Pulse 73   Temp 97.7 F (36.5 C) (Oral)   Resp (!) 27   Ht 5' 7"  (1.702 m)   Wt 70.7 kg   SpO2 97%   BMI 24.41 kg/m    I/O last 3 completed shifts: In: 1129.3 [I.V.:617.5; IV Piggyback:511.8] Out: 1025 [Urine:1025] No intake/output data recorded.  SpO2: 97 % O2 Flow Rate (L/min): 5 L/min FiO2 (%): 32 %  Estimated body mass index is 24.41 kg/m as calculated from the following:   Height as of this encounter: 5' 7"  (1.702 m).   Weight as of this encounter: 70.7 kg.   REVIEW OF SYSTEMS    ROS limited due to severe critical illness. General: positive for increased thirst Respiratory: positive for intermittent cough, negative for shortness of breath Cardiovascular: negative for palpitations, chest pain, lower extremity swelling Gastrointestinal: negative for abdominal pain, nausea/vomiting Neurological: negative for lightheadedness, syncope, headaches   PHYSICAL EXAMINATION  General: Critically ill appearing, deconditioned male, sitting up in bed in no acute distress Head: Normocephalic, atraumatic  Eyes: Pupils equal, round, reactive to light.  No scleral icterus.  Mouth: Moist mucosal membrane Neck: Supple, no JVD Pulmonary: Normal effort of breathing on supplemental oxygen, clear to  auscultation bilaterally Cardiovascular: S1 and S2. Regular rate and rhythm. No murmurs, rubs, or gallops.  Gastrointestinal: Soft, nontender, non-distended.  Positive bowel sounds.   Musculoskeletal: No swelling, clubbing, or edema Neurological: Alert and oriented to self, tracking and occasionally responding to questions Skin: Intact, warm, dry  MEDICATIONS: I have reviewed all medications and confirmed regimen as documented Scheduled Meds: . allopurinol  300 mg Oral Daily  . apixaban  5 mg Oral BID  . aspirin EC  81 mg Oral Daily  . atorvastatin  40 mg Oral QHS  . chlorhexidine  15 mL Mouth Rinse BID  . Chlorhexidine Gluconate Cloth  6 each Topical Daily  . furosemide  40 mg Intravenous Q12H  . insulin aspart  0-9 Units Subcutaneous Q4H  . mouth rinse  15 mL Mouth Rinse q12n4p  . metoprolol tartrate  12.5 mg Oral BID  . polyethylene glycol  17 g Oral Daily  . senna-docusate  1 tablet Oral BID  . sodium chloride flush  10-40 mL Intracatheter Q12H  . sodium chloride flush  3 mL Intravenous Q12H   Continuous Infusions: . sodium chloride 10 mL/hr at 01/12/20 1915  . sodium chloride 250 mL (01/12/20 1919)  . amiodarone 30 mg/hr (01/14/20 0410)  .  ceFAZolin (ANCEF) IV 2 g (01/14/20 0502)  . dexmedetomidine (PRECEDEX) IV infusion 0.7 mcg/kg/hr (01/14/20 0700)  . dextrose 75 mL/hr at 01/14/20 0909  . famotidine (PEPCID) IV Stopped (01/13/20 1238)  . phenylephrine (NEO-SYNEPHRINE) Adult infusion 20 mcg/min (01/13/20 2306)  . thiamine injection 500 mg (01/14/20 0353)   PRN Meds:.sodium chloride, acetaminophen, albuterol, fentaNYL (SUBLIMAZE) injection, metoprolol tartrate, midazolam, ondansetron (ZOFRAN) IV, polyethylene glycol, sodium chloride flush, sodium chloride flush   CULTURE RESULTS   Recent Results (from the past 240 hour(s))  Blood culture (routine x 2)     Status: None   Collection Time: 01/06/20  1:13 PM   Specimen: BLOOD  Result Value Ref Range Status   Specimen  Description BLOOD BLOOD LEFT HAND  Final   Special Requests   Final    BOTTLES DRAWN AEROBIC AND ANAEROBIC Blood Culture adequate volume   Culture   Final    NO GROWTH 5 DAYS Performed at [facility], [address], [place], [place] [address]    Report Status 01/11/2020 FINAL  Final  Blood culture (routine x 2)     Status: None   Collection Time: 01/06/20  1:26 PM   Specimen: BLOOD  Result Value Ref Range Status   Specimen Description BLOOD LEFT ANTECUBITAL  Final   Special Requests   Final    BOTTLES DRAWN AEROBIC AND ANAEROBIC Blood Culture adequate volume   Culture   Final    NO GROWTH 5 DAYS Performed at [facility], [address] [place], [place] [address]    Report Status 01/11/2020 FINAL  Final  MRSA PCR Screening     Status: None   Collection Time: 01/06/20  2:43 PM   Specimen: Nasal Mucosa; Nasopharyngeal  Result Value Ref Range Status   MRSA by PCR NEGATIVE NEGATIVE Final    Comment:        The GeneXpert MRSA Assay (FDA approved for NASAL specimens only), is one component of a comprehensive MRSA colonization surveillance program. It is not intended to diagnose MRSA infection nor to  guide or monitor treatment for MRSA infections. Performed at [facility], [address] [place], [place] [address]   Culture, respiratory (non-expectorated)     Status: None   Collection Time: 01/09/20  2:45 AM   Specimen: Tracheal Aspirate; Respiratory  Result Value Ref Range Status   Specimen Description   Final    TRACHEAL ASPIRATE Performed at [facility], [address], [place], [place] [address]    Special Requests   Final    NONE Performed at [facility], [address] [place], [place] [address]    Gram Stain   Final    MODERATE WBC PRESENT, PREDOMINANTLY PMN RARE GRAM POSITIVE COCCI Performed at [facility], [place] [address] [place],  [address]    Culture FEW STAPHYLOCOCCUS AUREUS  Final    Report Status 01/12/2020 FINAL  Final   Organism ID, Bacteria STAPHYLOCOCCUS AUREUS  Final      Susceptibility   Staphylococcus aureus - MIC*    CIPROFLOXACIN <=0.5 SENSITIVE Sensitive     ERYTHROMYCIN >=8 RESISTANT Resistant     GENTAMICIN <=0.5 SENSITIVE Sensitive     OXACILLIN <=0.25 SENSITIVE Sensitive     TETRACYCLINE <=1 SENSITIVE Sensitive     VANCOMYCIN 1 SENSITIVE Sensitive     TRIMETH/SULFA <=10 SENSITIVE Sensitive     CLINDAMYCIN RESISTANT Resistant     RIFAMPIN <=0.5 SENSITIVE Sensitive     Inducible Clindamycin POSITIVE Resistant     * FEW STAPHYLOCOCCUS AUREUS       Intake/Output Summary (Last 24 hours) at 01/14/2020 0846 Last data filed at 01/13/2020 1800 Gross per 24 hour  Intake 1129.32 ml  Output 575 ml  Net 554.32 ml     LABS  CBC Latest Ref Rng & Units 01/14/2020 01/13/2020 01/12/2020  WBC 4.0 - 10.5 K/uL 19.2(H) 21.3(H) 17.5(H)  Hemoglobin 13.0 - 17.0 g/dL 16.9 15.5 16.1  Hematocrit 39 - 52 % 54.3(H) 50.6 53.1(H)  Platelets 150 - 400 K/uL 1,013(HH) 789(H) 873(H)   BMP Latest Ref Rng & Units 01/14/2020 01/13/2020 01/13/2020  Glucose 70 - 99 mg/dL 158(H) - 149(H)  BUN 8 - 23 mg/dL 34(H) - 29(H)  Creatinine 0.61 - 1.24 mg/dL 1.00 - 0.90  Sodium 135 - 145 mmol/L 146(H) - 149(H)  Potassium 3.5 - 5.1 mmol/L 4.2 3.0(L) 3.7  Chloride 98 - 111 mmol/L 108 - 108  CO2 22 - 32 mmol/L 28 - 30  Calcium 8.9 - 10.3 mg/dL 9.3 - 9.9      IMAGING    No results found.   Nutrition Status: Nutrition Problem: Moderate Malnutrition Etiology: chronic illness (CHF) Signs/Symptoms: moderate fat depletion, moderate muscle depletion Interventions: Tube feeding, MVI   Indwelling Urinary Catheter continued, requirement due to   Reason to continue Indwelling Urinary Catheter strict Intake/Output monitoring for hemodynamic instability   Central Line/ continued, requirement due to  Reason to continue [employer] Monitoring of central venous pressure or other hemodynamic  parameters and poor IV access     ASSESSMENT AND PLAN SYNOPSIS [name] with severe respiratory failure due to metabolic acidosis with severe systolic CHF exacerbation, previous history of COVID-19 in the setting of drug abuse, undergoing therapy for HCAP, complicated by tachyarrhythmia and progressive heart failure.  Severe ACUTE Hypoxic and Hypercapnic Respiratory Failure - Respiratory status very tenuous - High risk for intubation/cardiac arrest - Continue bronchodilator therapy  ACUTE SYSTOLIC CARDIAC FAILURE- EF 20% - Oxygen as needed - Lasix as tolerated - Follow up cardiac enzymes  as indicated - Followed by cardiology  NEUROLOGY - Acute toxic metabolic encephalopathy - Start Valium 2.49m q6 - Continue Precedex - Continue Thiamine infusion  SHOCK-CARDIOGENIC due to arrhythmia - S/p electrical cardioversion - Use pressors as needed to keep MAP >55 - Continue amiodarone infusion  CARDIAC - ICU monitoring - Telemetry showing sinus rhythm with PVCs  ID - therapy for HCAP - Tracheal culture positive for MSSA - Continue IV abx as prescibed  GI - GI PROPHYLAXIS as indicated   DIET - NPO - Failed Yale 3oz swallow study today - Pending SLP evaluation - Constipation protocol as indicated  ENDO - Will use ICU hypoglycemic\Hyperglycemia protocol if indicated  ELECTROLYTES - Follow labs as needed - Replace as needed - Pharmacy consultation and following   DVT/GI PRX ordered and assessed TRANSFUSIONS AS NEEDED MONITOR FSBS I Assessed the need for Labs I Assessed the need for Foley I Assessed the need for Central Venous Line Family Discussion when available I Assessed the need for Mobilization I made an Assessment of medications to be adjusted accordingly Safety Risk assessment completed   CASE DISCUSSED IN MULTIDISCIPLINARY ROUNDS WITH ICU TEAM  Critical Care Time devoted to patient care services described in this note is 38 minutes.   Overall,  patient is critically ill, prognosis is guarded.  Patient with Multiorgan failure and at high risk for cardiac arrest and death.   I would recommend DNR/DNI status Recommend Palliative Care consultation  Fuad Forget DPatricia Pesa M.D.  LVelora HecklerPulmonary & Critical Care Medicine  Medical Director IBlainDirector AOceans [facility] DeridderCardio-Pulmonary Department

## 2020-01-14 NOTE — Progress Notes (Signed)
Much more awake and alert now. Knows who he is and where he is. Attempted Yale 3oz swallow study, he was not able to pass this bedside study. He started coughing at the end of the study. Educated patient on outcome of study and that he would need to remain NPO at this time. Will continue to monitor.

## 2020-01-14 NOTE — Progress Notes (Addendum)
OT Cancellation Note  Patient Details Name: Travis Maxwell MRN: [id number] DOB: 03/16/1950   Cancelled Treatment:    Reason Eval/Treat Not Completed: Medical issues which prohibited therapy  Upon chart review this AM, noted that pt with elevated platelets this date at 1,013 versus 789 yesterday. Will hold occupational therapy evaluation at this time. As this is the third attempt to see patient, will complete order per protocol. Please re-consult therapy services if/when appropriate. Thank you.  Rejeana Brock, MS, OTR/L ascom [phone] 01/14/20, 10:34 AM

## 2020-01-14 NOTE — Consult Note (Signed)
Consultation Note Date: 01/14/2020   Patient Name: Travis Maxwell  DOB: [birth year]  MRN: [id number]  Age / Sex: 69 y.o., male  PCP: [facility] Referring Physician: Erin Fulling, MD  Reason for Consultation: Establishing goals of care and Psychosocial/spiritual support  HPI/Patient Profile: 69 y.o. male   admitted on 01/06/2020 with PMH of HTN, DM, GERD, tobacco abuse, CAD s/p stenting, AVR with bioprosthetic valve, EF of20%. Paroxysmal A. fib on Eliquis.      Covid + 11/30/2019.  Admitted thru the ER with chest pain and SOB.    The patient has a history of coronary artery disease status post DES to mid RCA in 07/2017, chronic systolic CHF with LVEF <20%, aortic insufficieny and stenosis, status post aortic valve replacement with [facility] tissue 03/15/2018, paroxysmal atrial fibrillation on Eliquis and home amiodarone, type II diabetes, hypertension, hyperlipidemia, and tobacco and marijuana use.    Of note, the patient was admitted to [facility] ER 12/04/2019 for acute respiratory failure and acute on chronic systolic CHF in the setting of COVID pneumonia. 2D echocardiogram 08/2019 showed severely reduced LV function with LVEF <20% with moderate to severe mitral regurgitation and mild to moderate tricuspid regurgitation.   The patient  required ntubated, he was extubated on the 01-11-20 and remains extubated.  He is high risk to decompensate.  Patient does not have capacity to make medical decisions currently.   Family face treatment option decisions, advanced directive decisions and anticipatory care needs.   Clinical Assessment and Goals of Care:  This NP Lorinda Creed reviewed medical records, received report from team, assessed the patient and then meet at the patient's bedside along with his daughter/ [name]  to discuss diagnosis, prognosis, GOC, EOL wishes disposition and options.    Concept of Palliative Care was introduced as specialized medical care for people and their families living with serious illness.  If focuses on providing relief from the symptoms and stress of a serious illness.  The goal is to improve quality of life for both the patient and the family.  Created space and opportunity for  family to explore thoughts and feelings regarding current medical information.  Daughter present verbalizes an understanding of the seriousness of her fathers duration however at this time is open to all offered and available medical interventions to prolong life.  They are hopeful for improvement.     A  discussion was had today regarding advanced directives.  Concepts specific to code status, artifical feeding and hydration, continued IV antibiotics and rehospitalization was had.  The difference between a aggressive medical intervention path  and a palliative comfort care path for this patient at this time was had.  Values and goals of care important to patient and family were attempted to be elicited.   MOST form introduced, hard copy left for review  Education regarding the difference between an aggressive medical intervention path and a palliative comfort path for this patient at this time in this situation was had     Questions and  concerns addressed.  Patient  encouraged to call with questions or concerns.     PMT will continue to support holistically.           No documented healthcare power of attorney or he fits care planning documents.  Daughter that is present today tells me that she and her 2 other sisters work in unison for decisions and in the best interest of the patient. Patient currently does not have medical decisional capacity.   SUMMARY OF RECOMMENDATIONS    Code Status/Advance Care Planning:  Full code  Encouraged patient/family to consider DNR/DNI status understanding evidenced based poor outcomes in similar hospitalized patient, as the cause  of arrest is likely associated with advanced chronic illness rather than an easily reversible acute cardio-pulmonary event.   Palliative Prophylaxis:   Aspiration, Bowel Regimen, Delirium Protocol, Frequent Pain Assessment and Oral Care  Additional Recommendations (Limitations, Scope, Preferences):  Full Scope Treatment  Psycho-social/Spiritual:   Desire for further Chaplaincy support:no-declined  Additional Recommendations: Education on Hospice  Prognosis:   Unable to determine  Discharge Planning:  Education offered regarding various possible transitions of care; skilled nursing facility for rehab, home with caregivers, hospice benefit.    To Be Determined      Primary Diagnoses: Present on Admission: . Respiratory failure (HCC)   I have reviewed the medical record, interviewed the patient and family, and examined the patient. The following aspects are pertinent.  Past Medical History:  Diagnosis Date  . CAD (coronary artery disease)   . Chronic systolic (congestive) heart failure (HCC)   . Diabetes mellitus without complication (HCC)   . GERD (gastroesophageal reflux disease)   . Gout   . Hx of aortic valve replacement    Bioprosthetic valve  . Hypertension    Social History   Socioeconomic History  . Marital status: Divorced    Spouse name: Not on file  . Number of children: Not on file  . Years of education: Not on file  . Highest education level: Not on file  Occupational History  . Not on file  Tobacco Use  . Smoking status: Current Every Day Smoker    Packs/day: 0.50    Types: Cigarettes    Last attempt to quit: 08/10/2017    Years since quitting: 2.4  . Smokeless tobacco: Never Used  Vaping Use  . Vaping Use: Never used  Substance and Sexual Activity  . Alcohol use: Not Currently  . Drug use: Yes    Types: Marijuana    Comment: today  . Sexual activity: Not on file  Other Topics Concern  . Not on file  Social History Narrative  . Not  on file   Social Determinants of Health   Financial Resource Strain:   . Difficulty of Paying Living Expenses: Not on file  Food Insecurity:   . Worried About Programme researcher, broadcasting/film/video in the Last Year: Not on file  . Ran Out of Food in the Last Year: Not on file  Transportation Needs:   . Lack of Transportation (Medical): Not on file  . Lack of Transportation (Non-Medical): Not on file  Physical Activity:   . Days of Exercise per Week: Not on file  . Minutes of Exercise per Session: Not on file  Stress:   . Feeling of Stress : Not on file  Social Connections:   . Frequency of Communication with Friends and Family: Not on file  . Frequency of Social Gatherings with Friends and Family: Not on file  .  Attends Religious Services: Not on file  . Active Member of Clubs or Organizations: Not on file  . Attends Banker Meetings: Not on file  . Marital Status: Not on file   Family History  Problem Relation Age of Onset  . CAD Mother   . CAD Father    Scheduled Meds: . allopurinol  300 mg Oral Daily  . apixaban  5 mg Oral BID  . aspirin EC  81 mg Oral Daily  . atorvastatin  40 mg Oral QHS  . chlorhexidine  15 mL Mouth Rinse BID  . Chlorhexidine Gluconate Cloth  6 each Topical Daily  . furosemide  40 mg Intravenous Q12H  . insulin aspart  0-9 Units Subcutaneous Q4H  . mouth rinse  15 mL Mouth Rinse q12n4p  . metoprolol tartrate  12.5 mg Oral BID  . polyethylene glycol  17 g Oral Daily  . senna-docusate  1 tablet Oral BID  . sodium chloride flush  10-40 mL Intracatheter Q12H  . sodium chloride flush  3 mL Intravenous Q12H   Continuous Infusions: . sodium chloride 10 mL/hr at 01/12/20 1915  . sodium chloride 250 mL (01/12/20 1919)  . amiodarone 30 mg/hr (01/14/20 0410)  .  ceFAZolin (ANCEF) IV 2 g (01/14/20 0502)  . dexmedetomidine (PRECEDEX) IV infusion 0.7 mcg/kg/hr (01/14/20 0700)  . dextrose Stopped (01/13/20 1210)  . famotidine (PEPCID) IV Stopped (01/13/20 1238)   . phenylephrine (NEO-SYNEPHRINE) Adult infusion 20 mcg/min (01/13/20 2306)  . thiamine injection 500 mg (01/14/20 0353)   PRN Meds:.sodium chloride, acetaminophen, albuterol, fentaNYL (SUBLIMAZE) injection, metoprolol tartrate, midazolam, ondansetron (ZOFRAN) IV, polyethylene glycol, sodium chloride flush, sodium chloride flush Medications Prior to Admission:  Prior to Admission medications   Medication Sig Start Date End Date Taking? Authorizing Provider  albuterol (VENTOLIN HFA) 108 (90 Base) MCG/ACT inhaler Inhale 2 puffs into the lungs every 4 (four) hours as needed for wheezing or shortness of breath. 12/02/19  Yes Enedina Finner, MD  allopurinol (ZYLOPRIM) 300 MG tablet Take 300 mg by mouth daily. 03/22/18  Yes [provider]  apixaban (ELIQUIS) 5 MG TABS tablet Take 1 tablet (5 mg total) by mouth 2 (two) times daily. 06/13/19  Yes Menshew, Charlesetta Ivory, PA-C  aspirin EC 81 MG EC tablet Take 1 tablet (81 mg total) by mouth daily. 08/15/19  Yes Albertine Grates, MD  colchicine 0.6 MG tablet Take 2 tabs PO x 1, then 1 tab PO 1 hour later x 1 Max: 1.8 mg total dose per attack, do not repeat within 3 days. 06/13/19  Yes Menshew, Charlesetta Ivory, PA-C  metoprolol succinate (TOPROL-XL) 25 MG 24 hr tablet Take 25 mg by mouth daily.   Yes [provider]  nitroGLYCERIN (NITROSTAT) 0.4 MG SL tablet Place 1 tablet (0.4 mg total) under the tongue every 5 (five) minutes as needed for chest pain. 08/14/19  Yes Albertine Grates, MD  sitaGLIPtin (JANUVIA) 100 MG tablet Take 100 mg by mouth daily.   Yes [provider]   Allergies  Allergen Reactions  . Penicillins Other (See Comments)    Has patient had a PCN reaction causing immediate rash, facial/tongue/throat swelling, SOB or lightheadedness with hypotension: Unknown Has patient had a PCN reaction causing severe rash involving mucus membranes or skin necrosis: Unknown Has patient had a PCN reaction that required hospitalization: Unknown Has  patient had a PCN reaction occurring within the last 10 years: No If all of the above answers are "NO", then may proceed  with Cephalosporin use.    Review of Systems  Unable to perform ROS: Mental status change    Physical Exam  Vital Signs: BP 97/64   Pulse 73   Temp 97.7 F (36.5 C) (Oral)   Resp (!) 27   Ht 5\' 7"  (1.702 m)   Wt 70.7 kg   SpO2 97%   BMI 24.41 kg/m  Pain Scale: CPOT POSS *See Group Information*: 2-Acceptable,Slightly drowsy, easily aroused Pain Score: 0-No pain   SpO2: SpO2: 97 % O2 Device:SpO2: 97 % O2 Flow Rate: .O2 Flow Rate (L/min): 5 L/min  IO: Intake/output summary:   Intake/Output Summary (Last 24 hours) at 01/14/2020 0902 Last data filed at 01/13/2020 1800 Gross per 24 hour  Intake 1129.32 ml  Output 575 ml  Net 554.32 ml    LBM: Last BM Date:  (PTA) Baseline Weight: Weight: 72.6 kg Most recent weight: Weight: 70.7 kg     Palliative Assessment/Data: 30% at best   Discussed with DR 13/10/2019  Time In: 1000 Time Out: 1215 Time Total: 75 minutes Greater than 50%  of this time was spent counseling and coordinating care related to the above assessment and plan.  Signed by: 1216, NP   Please contact Palliative Medicine Team phone at [phone] for questions and concerns.  For individual provider: See [phone]

## 2020-01-14 NOTE — Progress Notes (Signed)
[facility] Cardiology    SUBJECTIVE: The patient denies chest pain, shortness of breath or palpitations. His only request is for water, though he did pass his swallow screen.   Vitals:   01/13/20 2330 01/13/20 2335 01/14/20 0000 01/14/20 0100  BP: 95/68 90/72 112/80 97/64  Pulse: 72 75 78 73  Resp: (!) 27 (!) 25 (!) 26 (!) 27  Temp:    97.7 F (36.5 C)  TempSrc:    Oral  SpO2: 98% 98% 99% 97%  Weight:      Height:         Intake/Output Summary (Last 24 hours) at 01/14/2020 1002 Last data filed at 01/14/2020 0911 Gross per 24 hour  Intake 1132.32 ml  Output 575 ml  Net 557.32 ml      PHYSICAL EXAM  General: chronically ill-appearing, sitting up in bed in no acute distress HEENT:  Normocephalic and atramatic Neck:  No JVD.  Lungs: normal effort of breathing on supplemental oxygen, decreased inspiratory effort, no wheezing Heart: HRRR, normal S1, S2 without murmur Msk:  Back normal, gait not assessed. No obvious deformities Extremities: No clubbing, cyanosis or edema.   Neuro: Alert and oriented to self   LABS: Basic Metabolic Panel: Recent Labs    01/13/20 0535 01/13/20 0535 01/13/20 1854 01/14/20 0508  NA 149*  --   --  146*  K 3.7   < > 3.0* 4.2  CL 108  --   --  108  CO2 30  --   --  28  GLUCOSE 149*  --   --  158*  BUN 29*  --   --  34*  CREATININE 0.90  --   --  1.00  CALCIUM 9.9  --   --  9.3  MG 2.3   < > 2.2 2.2  PHOS 3.0  --  2.9  --    < > = values in this interval not displayed.   Liver Function Tests: No results for input(s): AST, ALT, ALKPHOS, BILITOT, PROT, ALBUMIN in the last 72 hours. No results for input(s): LIPASE, AMYLASE in the last 72 hours. CBC: Recent Labs    01/13/20 0535 01/14/20 0508  WBC 21.3* 19.2*  NEUTROABS 14.9* 14.3*  HGB 15.5 16.9  HCT 50.6 54.3*  MCV 66.0* 66.5*  PLT 789* 1,013*   Cardiac Enzymes: No results for input(s): CKTOTAL, CKMB, CKMBINDEX, TROPONINI in the last 72 hours. BNP: Invalid input(s):  POCBNP D-Dimer: No results for input(s): DDIMER in the last 72 hours. Hemoglobin A1C: No results for input(s): HGBA1C in the last 72 hours. Fasting Lipid Panel: No results for input(s): CHOL, HDL, LDLCALC, TRIG, CHOLHDL, LDLDIRECT in the last 72 hours. Thyroid Function Tests: No results for input(s): TSH, T4TOTAL, T3FREE, THYROIDAB in the last 72 hours.  Invalid input(s): FREET3 Anemia Panel: No results for input(s): VITAMINB12, FOLATE, FERRITIN, TIBC, IRON, RETICCTPCT in the last 72 hours.  No results found.   Echo LVEF <20%  TELEMETRY: sinus rhythm, PVCs  ASSESSMENT AND PLAN:  Active Problems:   Respiratory failure (HCC)   Malnutrition of moderate degree  1. Tachyarrhythmia, narrow complex SVT, then wide complex tachycardia, status post electrical cardioversion, started on amiodarone drip. Now is sinus rhythm with PVCs. He was previously on amiodarone at home. Hypokalemic at 3.0; per nurse, after repletion, frequent ectopy lessened.  2. Acute on chronic systolic CHF with LVEF <20% currently on Precedex and IV Lasix BID. BNP 1046. BUN 29 and creatinine 0.9. Chest xray with improved pulmonary edema  since prior study with chronic pulmonary venous hypertension present. Questionable medical non-adherence.  3. Coronary artery disease status post NSTEMI 07/2017, status post DES to mid RCA. Patient presented with chest pain. Troponin borderline elevated and down trending (71--> 91--> 89), most consistent with myocardial injury secondary to respiratory failure.  4. Paroxysmal atrial fibrillation on amiodarone at home and Eliquis for stroke prevention. Currently in sinus rhythm. 5. Frequent premature ventricular contractions 6. Aortic stenosis, status post Edwards magna tissue valve in 03/2018. Most recent echocardiogram 08/2019 shows mild aortic regurgitation. 7. Leukocytosis WBC increasing (15.7 -> 17.5 -> 21.3->19.2). Patient is on IV antibiotics and afebrile (98.8 F). 8. Tobacco and  marijuana use 9. Acute toxic metabolic encephalopathy 10. Severe acute hypoxic and hypercapnic respiratory failure  Recommendations: 1. Continue amiodarone drip for now; plan to start 400 mg BID of PO amiodarone 1 hour before discontinuation of drip when patient is able to safely swallow. 2. Recommend reducing IV Lasix 40 mg to once daily due to hypotension 3. Continue Eliquis for stroke prevention when able to take PO 4. Defer ischemic work-up at this time as troponin is borderline elevated, without evidence for ischemia, most consistent with myocardial injury secondary to respiratory failure 5. Plan to resume metoprolol succinate and [place]o when blood pressure stable.  Discussed with Dr. Cassie Freer who agrees with above plan.   Leanora Ivanoff, PA-C 01/14/2020 10:02 AM

## 2020-01-14 NOTE — Evaluation (Signed)
Clinical/Bedside Swallow Evaluation Patient Details  Name: Travis Maxwell MRN: [id number] Date of Birth: [birth year]  Today's Date: 01/14/2020 Time: SLP Start Time (ACUTE ONLY): 1205 SLP Stop Time (ACUTE ONLY): 1305 SLP Time Calculation (min) (ACUTE ONLY): 60 min  Past Medical History:  Past Medical History:  Diagnosis Date   CAD (coronary artery disease)    Chronic systolic (congestive) heart failure (HCC)    Diabetes mellitus without complication (HCC)    GERD (gastroesophageal reflux disease)    Gout    Hx of aortic valve replacement    Bioprosthetic valve   Hypertension    Past Surgical History:  Past Surgical History:  Procedure Laterality Date   CORONARY STENT INTERVENTION N/A 07/25/2017   Procedure: CORONARY STENT INTERVENTION;  Surgeon: Alwyn Pea, MD;  Location: [facility] INVASIVE CV LAB;  Service: Cardiovascular;  Laterality: N/A;   LEFT HEART CATH AND CORONARY ANGIOGRAPHY N/A 07/25/2017   Procedure: LEFT HEART CATH AND CORONARY ANGIOGRAPHY;  Surgeon: Lamar Blinks, MD;  Location: [facility] INVASIVE CV LAB;  Service: Cardiovascular;  Laterality: N/A;   HPI:  Pt is a 69 y/o male w/ PMH of HTN, DM, GERD, tobacco abuse, CAD s/p stenting, AVR with bioprosthetic valve, EF of20%. Paroxysmal A. fib on Eliquis.  He was Covid19+ on 11/2019.  He presented to the ED in respiratory distress complaining of chest pain and shortness of breath.  He reported orthopnea and increased swelling.  Patient was placed on biPAP, patient progressive worsened and was emergently intubated until he extubated on 01/11/2020.    Assessment / Plan / Recommendation Clinical Impression  Pt appears to present w/ grossly adequate oropharyngeal phase swallow function w/ No overt pharyngeal phase dysphagia noted, No neuromuscular deficits noted.Pt consumed po trials given this eval w/ No overt, clinical s/s of aspiration during po trials. Pt exhibited min oral phase weakness in general; Edentulous at  Baseline. Pt appears at reduced risk for aspiration following general aspiration precautions and when given support w/ feeding at meals. During po trials, pt consumed thin liquids VIA CUP and puree consistencies w/ no overt coughing, decline in vocal quality, or change in respiratory presentation during/post trials. O2 sats remained mid90s+, RR mid20s+, HR 80. No increased WOB breathing or distress noted during/post trials. Oral phase appeared grossly [facility] w/ timely bolus management and control of bolus propulsion for A-P transfer for swallowing post delivery/acceptance of po trials. Pt exhibited Min+ decreased awareness of the po trials during the oral prep stage and required increased verbal/tactile cues during this stage. Oral clearing achieved w/ trial consistencies. OM Exam appeared grossly [facility] w/ no unilateral weakness noted; lower labial protrusion somewhat Baseline for pt which increased slight drolling d/t decreased oral awareness of control of saliva in general. Speech mumbled often but suspect min Baseline; clear when pt increased effort/energy/focus to speaking. Pt helped to Hold Cup to drink w/ support. Recommend initiation of a Pureed consistency diet, moistened foods; Thin liquidsVIA CUP. Recommend general aspiration precautions, Pills Crushed in Puree for safer, easier swallowing. NSG to assist w/ feeding and helping pt to hold cup to drink at meals. Education given to Daughter present on food consistency, general aspiration precautions. ST services to f/u next 1-2 days for ongoing toleration of diet and trials to upgrade when appropriate. NSG updated.  SLP Visit Diagnosis: Dysphagia, oral phase (R13.11) (weakness)    Aspiration Risk  Mild aspiration risk;Risk for inadequate nutrition/hydration (reduced following precautions; support)    Diet Recommendation  Dysphagia level 1 (puree) w/ gravies  to moisten; thin liquids VIA CUP. Aspiration precautions; Feeding Support at all meals.  Medication  Administration: Crushed with puree (for safer swallowing)    Other  Recommendations Recommended Consults:  (Dietician f/u) Oral Care Recommendations: Oral care BID;Oral care before and after PO;Staff/trained caregiver to provide oral care Other Recommendations:  (n/a)   Follow up Recommendations None (TBD)      Frequency and Duration min 2x/week  2 weeks       Prognosis Prognosis for Safe Diet Advancement: Fair (-Good) Barriers to Reach Goals: Time post onset;Severity of deficits (min Confusion; weakness; edentulous)      Swallow Study   General Date of Onset: 01/06/20 HPI: Pt is a 69 y/o male w/ PMH of HTN, DM, GERD, tobacco abuse, CAD s/p stenting, AVR with bioprosthetic valve, EF of20%. Paroxysmal A. fib on Eliquis.  He was Covid19+ on 11/2019.  He presented to the ED in respiratory distress complaining of chest pain and shortness of breath.  He reported orthopnea and increased swelling.  Patient was placed on biPAP, patient progressive worsened and was emergently intubated until he extubated on 01/11/2020.  Type of Study: Bedside Swallow Evaluation Previous Swallow Assessment: none Diet Prior to this Study: NPO Temperature Spikes Noted: No (wbc 19.2 declining, MSSA+) Respiratory Status: Nasal cannula (2-5L since extubation) History of Recent Intubation: Yes Length of Intubations (days): 6 days Date extubated: 01/11/20 Behavior/Cognition: Alert;Cooperative;Pleasant mood;Distractible;Requires cueing (mumbled speech at times - some baseline) Oral Cavity Assessment: Excessive secretions (min) Oral Care Completed by SLP: Yes Oral Cavity - Dentition: Edentulous Vision: Functional for self-feeding Self-Feeding Abilities: Able to feed self;Needs assist;Needs set up;Total assist (weakness) Patient Positioning: Upright in bed (needed full positioning) Baseline Vocal Quality: Low vocal intensity (mumbled speech at times) Volitional Cough:  (Fair) Volitional Swallow: Able to elicit     Oral/Motor/Sensory Function Overall Oral Motor/Sensory Function: Generalized oral weakness (lower protruding lip baseline; slight drooling) Facial ROM: Within Functional Limits Facial Symmetry: Within Functional Limits Lingual ROM: Within Functional Limits Lingual Symmetry: Within Functional Limits Lingual Strength: Within Functional Limits   Ice Chips Ice chips: Within functional limits Presentation: Spoon (3 trials) Other Comments: grossly   Thin Liquid Thin Liquid: Within functional limits Presentation: Cup;Self Fed (full assist to steady/hold cup; ~6+ ozs)    Nectar Thick Nectar Thick Liquid: Not tested   Honey Thick Honey Thick Liquid: Not tested   Puree Puree: Within functional limits Presentation: Spoon (fed; 10+ trials)   Solid     Solid: Not tested Other Comments: overall weakness; edentulous       Jerilynn Som, MS, CCC-SLP Speech Language Pathologist Rehab Services [phone] [facility] 01/14/2020,3:08 PM

## 2020-01-15 LAB — CBC WITH DIFFERENTIAL/PLATELET
Abs Immature Granulocytes: 0.2 10*3/uL — ABNORMAL HIGH (ref 0.00–0.07)
Basophils Absolute: 0.2 10*3/uL — ABNORMAL HIGH (ref 0.0–0.1)
Basophils Relative: 1 %
Eosinophils Absolute: 0.9 10*3/uL — ABNORMAL HIGH (ref 0.0–0.5)
Eosinophils Relative: 5 %
HCT: 51.4 % (ref 39.0–52.0)
Hemoglobin: 15.8 g/dL (ref 13.0–17.0)
Immature Granulocytes: 1 %
Lymphocytes Relative: 14 %
Lymphs Abs: 2.1 10*3/uL (ref 0.7–4.0)
MCH: 20.3 pg — ABNORMAL LOW (ref 26.0–34.0)
MCHC: 30.7 g/dL (ref 30.0–36.0)
MCV: 66.1 fL — ABNORMAL LOW (ref 80.0–100.0)
Monocytes Absolute: 1.2 10*3/uL — ABNORMAL HIGH (ref 0.1–1.0)
Monocytes Relative: 8 %
Neutro Abs: 11.1 10*3/uL — ABNORMAL HIGH (ref 1.7–7.7)
Neutrophils Relative %: 71 %
Platelets: 892 10*3/uL — ABNORMAL HIGH (ref 150–400)
RBC: 7.78 MIL/uL — ABNORMAL HIGH (ref 4.22–5.81)
RDW: 24.5 % — ABNORMAL HIGH (ref 11.5–15.5)
Smear Review: NORMAL
WBC: 15.7 10*3/uL — ABNORMAL HIGH (ref 4.0–10.5)
nRBC: 0.1 % (ref 0.0–0.2)

## 2020-01-15 LAB — MAGNESIUM: Magnesium: 1.9 mg/dL (ref 1.7–2.4)

## 2020-01-15 LAB — COMPREHENSIVE METABOLIC PANEL
ALT: 16 U/L (ref 0–44)
AST: 31 U/L (ref 15–41)
Albumin: 2.6 g/dL — ABNORMAL LOW (ref 3.5–5.0)
Alkaline Phosphatase: 66 U/L (ref 38–126)
Anion gap: 10 (ref 5–15)
BUN: 28 mg/dL — ABNORMAL HIGH (ref 8–23)
CO2: 26 mmol/L (ref 22–32)
Calcium: 9 mg/dL (ref 8.9–10.3)
Chloride: 104 mmol/L (ref 98–111)
Creatinine, Ser: 0.77 mg/dL (ref 0.61–1.24)
GFR, Estimated: >60 mL/min (ref >60)
Glucose, Bld: 149 mg/dL — ABNORMAL HIGH (ref 70–99)
Potassium: 3.4 mmol/L — ABNORMAL LOW (ref 3.5–5.1)
Sodium: 140 mmol/L (ref 135–145)
Total Bilirubin: 1.2 mg/dL (ref 0.3–1.2)
Total Protein: 7 g/dL (ref 6.5–8.1)

## 2020-01-15 LAB — PATHOLOGIST SMEAR REVIEW

## 2020-01-15 LAB — GLUCOSE, CAPILLARY
Glucose-Capillary: 139 mg/dL — ABNORMAL HIGH (ref 70–99)
Glucose-Capillary: 129 mg/dL — ABNORMAL HIGH (ref 70–99)
Glucose-Capillary: 153 mg/dL — ABNORMAL HIGH (ref 70–99)

## 2020-01-15 LAB — PHOSPHORUS: Phosphorus: 2.9 mg/dL (ref 2.5–4.6)

## 2020-01-15 MED ORDER — ENSURE ENLIVE PO LIQD
237.0000 mL | Freq: Two times a day (BID) | ORAL | Status: DC
  Administered 2020-01-15 – 2020-01-20 (×5): 237 mL via ORAL

## 2020-01-15 MED ORDER — METOPROLOL SUCCINATE ER 25 MG PO TB24
25.0000 mg | ORAL_TABLET | Freq: Every day | ORAL | Status: DC
  Administered 2020-01-15 – 2020-01-22 (×5): 25 mg via ORAL
  Filled 2020-01-15 (×9): qty 1

## 2020-01-15 NOTE — Progress Notes (Signed)
CRITICAL CARE NOTE  [name] presenting to the ED on 01/06/2020 in respiratory distress, complaining of chest pain and shortness of breath. He also reported orthopnea and increased lower extremity swelling. History was limited due to acuity of condition and respiratory status. Patient was placed on biPAP, but progressively worsened and was emergently intubated. Patient is a current 0.5ppd smoker and uses marijuana. He has a severe metabolic acidosis. Patient has a past medical history significant for HTN, T2DM, HLD, GERD, CAD (s/p PCI), AVR with bioprosthetic valve, systolic CHF with EF of 97%, and paroxysmal A. Fib, currently anticoagulated on Eliquis. EKG upon arrival to ED revealed sinus rhythm at a rate of 99bpm with RBBB and PVCs with ST elevation in II, III, avF, V4-V6. Cardiologist on call stated that STEMI criteria were not met and advised to manage CHF. Patient received IV diltiazem and reported improvement of symptoms. Labs were notable for high sensitivity troponin 71->91->89. BNP 1046, creatinine 1.26, BUN 21, potassium 5.4. CXR showed improved pulmonary edema since prior study with chronic pulmonary venous hypertension. Notably, patient was admitted to [facility] ED 12/04/2019 for acute respiratory failure and acute on chronic systolic CHF in the setting of COVID pneumonia.   SIGNIFICANT EVENTS 11/2 - severe cardiogenic shock, remains on vent 11/3 - severe respiratory failure, severe systolic CHF 41/6 - patient remains critically ill 11/5 - patient remains critically ill 11/6 - off pressors, extubated to biPAP 11/7 - remains extubated, febrile, MSSA+ tracheal culture - started ancef 11/8 - remains extubated, started amiodarone drip for tachyarrhythmia, cardiology consulted  CC  Follow up respiratory failure  SUBJECTIVE Patient remains critically ill Prognosis is guarded  11/8 - remains extubated, on 2L by nasal cannula. Afebrile today with white count of 21.3K, up from 15.7K yesterday.  Still encephalopathic and drowsy. On and off Precedex. Went into narrow complex SVT, then progressed to wide complex tachycardia was shocked and given a loading dose of amiodarone. Will remain on amiodarone gtt. Cardiology at bedside.   11/9 - remains extubated, on 2L by nasal cannula. Afebrile today with white count of 19.2K, down since yesterday. BP soft overnight, was started on Neo-synephrine infusion. More alert than yesterday, but only oriented to self. Failed [place] 3oz swallow study early this morning, started coughing at the end of the study. Will need to remain NPO until cleared by SLP. Patient has been educated on this, yet continues to incessantly ask for water.   11/10- patient clinically improved. He is alert communicative and in no distress. He ate entire meal without issues. Still has confusion intermittently during interview. Frequent PVCs on telemetry.  Will initiate OT/PT today for downgrade planning.  VITALS BP 108/75   Pulse (!) 120   Temp 97.8 F (36.6 C) (Oral)   Resp 19   Ht 5' 7"  (1.702 m)   Wt 70.7 kg   SpO2 100%   BMI 24.41 kg/m    I/O last 3 completed shifts: In: 878.1 [I.V.:528.1; IV Piggyback:350] Out: 320 [Urine:320] No intake/output data recorded.  SpO2: 100 % O2 Flow Rate (L/min): 4 L/min FiO2 (%): 32 %  Estimated body mass index is 24.41 kg/m as calculated from the following:   Height as of this encounter: 5' 7"  (1.702 m).   Weight as of this encounter: 70.7 kg.   REVIEW OF SYSTEMS    ROS - 10 point ROS done and is negative except as per subjective findings.    PHYSICAL EXAMINATION  General: Critically ill appearing, deconditioned male, sitting up  in bed in no acute distress Head: Normocephalic, atraumatic Eyes: Pupils equal, round, reactive to light.  No scleral icterus.  Mouth: Moist mucosal membrane Neck: Supple, no JVD Pulmonary: Normal effort of breathing on supplemental oxygen, clear to auscultation bilaterally Cardiovascular: S1  and S2. Regular rate and rhythm. No murmurs, rubs, or gallops.  Gastrointestinal: Soft, nontender, non-distended.  Positive bowel sounds.   Musculoskeletal: No swelling, clubbing, or edema Neurological: Alert and oriented to self, tracking and occasionally responding to questions Skin: Intact, warm, dry  MEDICATIONS: I have reviewed all medications and confirmed regimen as documented Scheduled Meds: . allopurinol  300 mg Oral Daily  . amiodarone  400 mg Oral BID  . apixaban  5 mg Oral BID  . aspirin EC  81 mg Oral Daily  . atorvastatin  40 mg Oral QHS  . chlorhexidine  15 mL Mouth Rinse BID  . Chlorhexidine Gluconate Cloth  6 each Topical Daily  . feeding supplement  237 mL Oral BID BM  . furosemide  40 mg Intravenous Q12H  . insulin aspart  0-9 Units Subcutaneous TID AC & HS  . mouth rinse  15 mL Mouth Rinse q12n4p  . metoprolol succinate  25 mg Oral Daily  . polyethylene glycol  17 g Oral Daily  . senna-docusate  1 tablet Oral BID  . sodium chloride flush  10-40 mL Intracatheter Q12H  . sodium chloride flush  3 mL Intravenous Q12H   Continuous Infusions: . sodium chloride 10 mL/hr at 01/12/20 1915  . sodium chloride 250 mL (01/12/20 1919)  . dexmedetomidine (PRECEDEX) IV infusion 0.4 mcg/kg/hr (01/15/20 0600)  . famotidine (PEPCID) IV 20 mg (01/15/20 0947)  . thiamine injection 500 mg (01/15/20 1402)   PRN Meds:.sodium chloride, acetaminophen, albuterol, diazepam, fentaNYL (SUBLIMAZE) injection, metoprolol tartrate, midazolam, ondansetron (ZOFRAN) IV, polyethylene glycol, sodium chloride flush, sodium chloride flush   CULTURE RESULTS   Recent Results (from the past 240 hour(s))  Blood culture (routine x 2)     Status: None   Collection Time: 01/06/20  1:13 PM   Specimen: BLOOD  Result Value Ref Range Status   Specimen Description BLOOD BLOOD LEFT HAND  Final   Special Requests   Final    BOTTLES DRAWN AEROBIC AND ANAEROBIC Blood Culture adequate volume   Culture   Final     NO GROWTH 5 DAYS Performed at [facility], [address] [place], [place] [address]    Report Status 01/11/2020 FINAL  Final  Blood culture (routine x 2)     Status: None   Collection Time: 01/06/20  1:26 PM   Specimen: BLOOD  Result Value Ref Range Status   Specimen Description BLOOD LEFT ANTECUBITAL  Final   Special Requests   Final    BOTTLES DRAWN AEROBIC AND ANAEROBIC Blood Culture adequate volume   Culture   Final    NO GROWTH 5 DAYS Performed at [facility], [address] [place], [place] [address]    Report Status 01/11/2020 FINAL  Final  MRSA PCR Screening     Status: None   Collection Time: 01/06/20  2:43 PM   Specimen: Nasal Mucosa; Nasopharyngeal  Result Value Ref Range Status   MRSA by PCR NEGATIVE NEGATIVE Final    Comment:        The GeneXpert MRSA Assay (FDA approved for NASAL specimens only), is one component of a comprehensive MRSA colonization surveillance program. It is not intended to diagnose MRSA infection nor to guide or monitor  treatment for MRSA infections. Performed at [facility], [address] [place], [place] [address]   Culture, respiratory (non-expectorated)     Status: None   Collection Time: 01/09/20  2:45 AM   Specimen: Tracheal Aspirate; Respiratory  Result Value Ref Range Status   Specimen Description   Final    TRACHEAL ASPIRATE Performed at [facility], [address], [place], [place] [address]    Special Requests   Final    NONE Performed at [facility], [address] [place], [place] [address]    Gram Stain   Final    MODERATE WBC PRESENT, PREDOMINANTLY PMN RARE GRAM POSITIVE COCCI Performed at [facility], [place] [address], [place], [place] [address]    Culture FEW STAPHYLOCOCCUS AUREUS  Final   Report Status 01/12/2020 FINAL  Final   Organism ID, Bacteria STAPHYLOCOCCUS AUREUS  Final      Susceptibility   Staphylococcus aureus - MIC*     CIPROFLOXACIN <=0.5 SENSITIVE Sensitive     ERYTHROMYCIN >=8 RESISTANT Resistant     GENTAMICIN <=0.5 SENSITIVE Sensitive     OXACILLIN <=0.25 SENSITIVE Sensitive     TETRACYCLINE <=1 SENSITIVE Sensitive     VANCOMYCIN 1 SENSITIVE Sensitive     TRIMETH/SULFA <=10 SENSITIVE Sensitive     CLINDAMYCIN RESISTANT Resistant     RIFAMPIN <=0.5 SENSITIVE Sensitive     Inducible Clindamycin POSITIVE Resistant     * FEW STAPHYLOCOCCUS AUREUS       Intake/Output Summary (Last 24 hours) at 01/15/2020 1627 Last data filed at 01/15/2020 0600 Gross per 24 hour  Intake 875.13 ml  Output 320 ml  Net 555.13 ml     LABS  CBC Latest Ref Rng & Units 01/15/2020 01/14/2020 01/14/2020  WBC 4.0 - 10.5 K/uL 15.7(H) 17.5(H) 19.2(H)  Hemoglobin 13.0 - 17.0 g/dL 15.8 15.9 16.9  Hematocrit 39 - 52 % 51.4 52.3(H) 54.3(H)  Platelets 150 - 400 K/uL 892(H) 901(HH) 1,013(HH)   BMP Latest Ref Rng & Units 01/15/2020 01/14/2020 01/13/2020  Glucose 70 - 99 mg/dL 149(H) 158(H) -  BUN 8 - 23 mg/dL 28(H) 34(H) -  Creatinine 0.61 - 1.24 mg/dL 0.77 1.00 -  Sodium 135 - 145 mmol/L 140 146(H) -  Potassium 3.5 - 5.1 mmol/L 3.4(L) 4.2 3.0(L)  Chloride 98 - 111 mmol/L 104 108 -  CO2 22 - 32 mmol/L 26 28 -  Calcium 8.9 - 10.3 mg/dL 9.0 9.3 -      IMAGING    No results found.   Nutrition Status: Nutrition Problem: Moderate Malnutrition Etiology: chronic illness (CHF) Signs/Symptoms: moderate fat depletion, moderate muscle depletion Interventions: Tube feeding, MVI   Indwelling Urinary Catheter continued, requirement due to   Reason to continue Indwelling Urinary Catheter strict Intake/Output monitoring for hemodynamic instability   Central Line/ continued, requirement due to  Reason to continue [employer] Monitoring of central venous pressure or other hemodynamic parameters and poor IV access     ASSESSMENT AND PLAN SYNOPSIS [name] with severe respiratory failure due to metabolic acidosis with  severe systolic CHF exacerbation, previous history of COVID-19 in the setting of drug abuse, undergoing therapy for HCAP, complicated by tachyarrhythmia and progressive heart failure.  Severe ACUTE Hypoxic and Hypercapnic Respiratory Failure - possible related to emphysema and COPD with active smoking of tobacco and drugs.  - Continue bronchodilator therapy  ACUTE on chronic SYSTOLIC CARDIAC FAILURE- EF 20% - Oxygen as needed - Lasix as tolerated - Follow  up cardiac enzymes as indicated - cardiology on case appreciate input  NEUROLOGY - Acute toxic metabolic encephalopathy - Start Valium 2.51m q6 - Continue Precedex - Continue Thiamine infusion  SHOCK-CARDIOGENIC due to arrhythmia - S/p electrical cardioversion - Use pressors as needed to keep MAP >55 - Continue amiodarone infusion  CARDIAC - ICU monitoring - Telemetry showing sinus rhythm with PVCs  ID - therapy for HCAP - Tracheal culture positive for MSSA - Continue IV abx as prescibed  GI - GI PROPHYLAXIS as indicated   DIET - NPO - Failed [place] 3oz swallow study today - Pending SLP evaluation - Constipation protocol as indicated  ENDO - Will use ICU hypoglycemic\Hyperglycemia protocol if indicated  ELECTROLYTES - Follow labs as needed - Replace as needed - Pharmacy consultation and following   DVT/GI PRX ordered and assessed TRANSFUSIONS AS NEEDED MONITOR FSBS I Assessed the need for Labs I Assessed the need for Foley I Assessed the need for Central Venous Line Family Discussion when available I Assessed the need for Mobilization I made an Assessment of medications to be adjusted accordingly Safety Risk assessment completed   CASE DISCUSSED IN MULTIDISCIPLINARY ROUNDS WITH ICU TEAM  Critical Care Time devoted to patient care services described in this note is 38 minutes.   Overall, patient is critically ill, prognosis is guarded.  Patient with Multiorgan failure and at high risk for cardiac arrest  and death.   I would recommend DNR/DNI status Recommend Palliative Care consultation   FOttie Glazier M.D.  Pulmonary & [place]

## 2020-01-15 NOTE — Progress Notes (Signed)
Nutrition Follow-up  DOCUMENTATION CODES:   Non-severe (moderate) malnutrition in context of chronic illness  INTERVENTION:  Provide Ensure Enlive po BID, each supplement provides 350 kcal and 20 grams of protein. Patient prefers chocolate.  Pt would benefit from nutrient dense supplement. One Ensure Enlive supplement provides 350 kcals, 20 grams protein, and 44-45 grams of carbohydrate vs one Glucerna shake supplement, which provides 220 kcals, 10 grams of protein, and 26 grams of carbohydrate. Given pt's hx of DM, RD will reassess adequacy of PO intake, CBGS, and adjust supplement regimen as appropriate at follow-up.   NUTRITION DIAGNOSIS:   Moderate Malnutrition related to chronic illness (CHF) as evidenced by moderate fat depletion, moderate muscle depletion.  Ongoing.  GOAL:   Patient will meet greater than or equal to 90% of their needs  Progressing.  MONITOR:   Vent status, Labs, Weight trends, TF tolerance, I & O's  REASON FOR ASSESSMENT:   Ventilator, Consult Enteral/tube feeding initiation and management  ASSESSMENT:   69 year old male with PMHx of HTN, DM, gout, hx AVR with bioprosthetic valve, GERD, CAD, CHF (EF 20%), recent COVID-19 11/30/2019 admitted with respiratory failure, severe metabolic acidosis, CHF exacerbation.  11/1 intubated 11/6 extubated 11/9 diet advanced to dysphagia 1 with thin liquids following SLP evaluation  Met with patient at bedside. He is now more alert and able to answer questions. He reports his appetite is decreased and was also decreased PTA. He reports he was eating less at meals PTA but is unable to provide specifics on intake. He reports he was not able to finish all of his breakfast this morning. Per chart he ate 45% of that meal. Discussed importance of adequate intake of calories and protein. Patient is amenable to drinking oral nutrition supplements to help meet calorie/protein needs. He prefers chocolate flavor.  Patient  reports his UBW was 250 lbs many years ago and has lost weight over time.  No documented bowel movements this admission. Patient ordered for bowel regimen.  Medications reviewed and include: Lasix 40 mg Q12hrs IV, Novolog 0-9 units TID and QHS, Miralax, senna-docusate 1 tablet BID, Precedex gtt, famotidine, thiamine 500 mg Q8hrs IV.  Labs reviewed: CBG 139-145, Potassium 3.4, BUN 28.  I/O: 320 mL UOP yesterday (0.2 mL/kg/hr)  Weight trend: 70.7 kg on 11/10; -8 kg from 11/1 with diuresis  Diet Order:   Diet Order            DIET - DYS 1 Room service appropriate? Yes with Assist; Fluid consistency: Thin  Diet effective now                EDUCATION NEEDS:   No education needs have been identified at this time  Skin:  Skin Assessment: Reviewed RN Assessment  Last BM:  Unknown  Height:   Ht Readings from Last 1 Encounters:  01/06/20 _0  (1.702 m)   Weight:   Wt Readings from Last 1 Encounters:  01/15/20 70.7 kg   Ideal Body Weight:  67.3 kg  BMI:  Body mass index is 24.41 kg/m.  Estimated Nutritional Needs:   Kcal:  2000-2200  Protein:  100-110 grams  Fluid:  1.5-1.8 L/day or per MD  Jacklynn Barnacle, MS, RD, LDN Pager number available on Amion

## 2020-01-15 NOTE — Progress Notes (Signed)
[facility] Cardiology  SUBJECTIVE: Patient laying in bed, awake, much more alert, conversing, denies chest pain or shortness of breath   Vitals:   01/15/20 0500 01/15/20 0600 01/15/20 0700 01/15/20 0730  BP: 93/60 105/62 106/73   Pulse: (!) 57     Resp: (!) 23 (!) 24 (!) 27 (!) 25  Temp:      TempSrc:      SpO2: 93%     Weight: 70.7 kg     Height:         Intake/Output Summary (Last 24 hours) at 01/15/2020 0750 Last data filed at 01/15/2020 0600 Gross per 24 hour  Intake 878.13 ml  Output 320 ml  Net 558.13 ml      PHYSICAL EXAM  General: Well developed, well nourished, in no acute distress HEENT:  Normocephalic and atramatic Neck:  No JVD.  Lungs: Clear bilaterally to auscultation and percussion. Heart: HRRR . Normal S1 and S2 without gallops or murmurs.  Abdomen: Bowel sounds are positive, abdomen soft and non-tender  Msk:  Back normal, normal gait. Normal strength and tone for age. Extremities: No clubbing, cyanosis or edema.   Neuro: Alert and oriented X 3. Psych:  Good affect, responds appropriately   LABS: Basic Metabolic Panel: Recent Labs    01/13/20 0535 01/13/20 1854 01/14/20 0508 01/15/20 0445  NA   < >  --  146* 140  K   < > 3.0* 4.2 3.4*  CL   < >  --  108 104  CO2   < >  --  28 26  GLUCOSE   < >  --  158* 149*  BUN   < >  --  34* 28*  CREATININE   < >  --  1.00 0.77  CALCIUM   < >  --  9.3 9.0  MG   < > 2.2 2.2 1.9  PHOS  --  2.9  --  2.9   < > = values in this interval not displayed.   Liver Function Tests: Recent Labs    01/15/20 0445  AST 31  ALT 16  ALKPHOS 66  BILITOT 1.2  PROT 7.0  ALBUMIN 2.6*   No results for input(s): LIPASE, AMYLASE in the last 72 hours. CBC: Recent Labs    01/14/20 0508 01/14/20 0508 01/14/20 2135 01/15/20 0445  WBC 19.2*   < > 17.5* 15.7*  NEUTROABS 14.3*  --   --  11.1*  HGB 16.9   < > 15.9 15.8  HCT 54.3*   < > 52.3* 51.4  MCV 66.5*   < > 66.1* 66.1*  PLT 1,013*   < > 901* 892*   < > = values  in this interval not displayed.   Cardiac Enzymes: No results for input(s): CKTOTAL, CKMB, CKMBINDEX, TROPONINI in the last 72 hours. BNP: Invalid input(s): POCBNP D-Dimer: No results for input(s): DDIMER in the last 72 hours. Hemoglobin A1C: No results for input(s): HGBA1C in the last 72 hours. Fasting Lipid Panel: No results for input(s): CHOL, HDL, LDLCALC, TRIG, CHOLHDL, LDLDIRECT in the last 72 hours. Thyroid Function Tests: No results for input(s): TSH, T4TOTAL, T3FREE, THYROIDAB in the last 72 hours.  Invalid input(s): FREET3 Anemia Panel: No results for input(s): VITAMINB12, FOLATE, FERRITIN, TIBC, IRON, RETICCTPCT in the last 72 hours.  No results found.   Echo severe dilated cardiomyopathy, LVEF less than 20%  TELEMETRY: Sinus rhythm:  ASSESSMENT AND PLAN:  Active Problems:   Acute on chronic systolic congestive heart  failure (HCC)   Respiratory failure (HCC)   Malnutrition of moderate degree   Adult failure to thrive   Palliative care by specialist   DNR (do not resuscitate) discussion    1. Tachyarrhythmia, narrow complex SVT, then wide complex tachycardia, status post electrical cardioversion, started on amiodarone drip. Now is sinus rhythm with PVCs. He was previously on amiodarone at home. Hypokalemic at 3.0; per nurse, after repletion, frequent ectopy lessened.  Amiodarone drip transition to amiodarone 400 mg twice daily. 2. Acute on chronic systolic CHFwith LVEF <20% , appears euvolemic.  Patient was previously on metoprolol succinate and Entresto as outpatient. 3. Coronary artery disease status post NSTEMI 07/2017, status post DES to mid RCA. Patient presented with chest pain. Troponin borderline elevated and down trending (71-->91-->89), most consistent with myocardial injury (ICD10 IA5) secondary to respiratory failure.  4. Paroxysmal atrial fibrillation on amiodarone at home and Eliquis for stroke prevention. Currently in sinus rhythm, transition to  amiodarone 400 mg twice daily. 5. Frequent premature ventricular contractions 6. Aortic stenosis, status post Edwards magna tissue valve in 03/2018. Most recent echocardiogram 08/2019 shows mild aortic regurgitation. 7. LeukocytosisWBC increasing (15.7 ->17.5 ->21.3->19.2). Patient is on IV antibiotics and afebrile (98.8 F). 8. Tobacco and marijuana use 9. Acute toxic metabolic encephalopathy 10. Severe acute hypoxic and hypercapnic respiratory failure  Recommendations  1.  Continue current medications 2.  Continue diuresis 3.  Fully monitor renal status 4.  Continue Eliquis for stroke prevention 5.  Continue amiodarone 400 mg twice daily 6.  Change metoprolol to tartrate to metoprolol succinate 7.  Restart Entresto prior to discharge    Marcina Millard, MD, PhD, [facility] 01/15/2020 7:50 AM

## 2020-01-16 LAB — GLUCOSE, CAPILLARY
Glucose-Capillary: 84 mg/dL (ref 70–99)
Glucose-Capillary: 96 mg/dL (ref 70–99)
Glucose-Capillary: 179 mg/dL — ABNORMAL HIGH (ref 70–99)
Glucose-Capillary: 199 mg/dL — ABNORMAL HIGH (ref 70–99)

## 2020-01-16 LAB — COMPREHENSIVE METABOLIC PANEL
ALT: 13 U/L (ref 0–44)
AST: 29 U/L (ref 15–41)
Albumin: 3.2 g/dL — ABNORMAL LOW (ref 3.5–5.0)
Alkaline Phosphatase: 77 U/L (ref 38–126)
Anion gap: 14 (ref 5–15)
BUN: 24 mg/dL — ABNORMAL HIGH (ref 8–23)
CO2: 23 mmol/L (ref 22–32)
Calcium: 8.7 mg/dL — ABNORMAL LOW (ref 8.9–10.3)
Chloride: 101 mmol/L (ref 98–111)
Creatinine, Ser: 0.92 mg/dL (ref 0.61–1.24)
GFR, Estimated: >60 mL/min (ref >60)
Glucose, Bld: 165 mg/dL — ABNORMAL HIGH (ref 70–99)
Potassium: 3.4 mmol/L — ABNORMAL LOW (ref 3.5–5.1)
Sodium: 138 mmol/L (ref 135–145)
Total Bilirubin: 1.1 mg/dL (ref 0.3–1.2)
Total Protein: 8.3 g/dL — ABNORMAL HIGH (ref 6.5–8.1)

## 2020-01-16 LAB — CBC WITH DIFFERENTIAL/PLATELET
Abs Immature Granulocytes: 0.14 10*3/uL — ABNORMAL HIGH (ref 0.00–0.07)
Basophils Absolute: 0.2 10*3/uL — ABNORMAL HIGH (ref 0.0–0.1)
Basophils Relative: 2 %
Eosinophils Absolute: 0.9 10*3/uL — ABNORMAL HIGH (ref 0.0–0.5)
Eosinophils Relative: 7 %
HCT: 51.7 % (ref 39.0–52.0)
Hemoglobin: 15.4 g/dL (ref 13.0–17.0)
Immature Granulocytes: 1 %
Lymphocytes Relative: 18 %
Lymphs Abs: 2.5 10*3/uL (ref 0.7–4.0)
MCH: 20 pg — ABNORMAL LOW (ref 26.0–34.0)
MCHC: 29.8 g/dL — ABNORMAL LOW (ref 30.0–36.0)
MCV: 67.2 fL — ABNORMAL LOW (ref 80.0–100.0)
Monocytes Absolute: 1.1 10*3/uL — ABNORMAL HIGH (ref 0.1–1.0)
Monocytes Relative: 8 %
Neutro Abs: 8.7 10*3/uL — ABNORMAL HIGH (ref 1.7–7.7)
Neutrophils Relative %: 64 %
Platelets: 868 10*3/uL — ABNORMAL HIGH (ref 150–400)
RBC: 7.69 MIL/uL — ABNORMAL HIGH (ref 4.22–5.81)
RDW: 24.2 % — ABNORMAL HIGH (ref 11.5–15.5)
Smear Review: NORMAL
WBC: 13.5 10*3/uL — ABNORMAL HIGH (ref 4.0–10.5)
nRBC: 0.1 % (ref 0.0–0.2)

## 2020-01-16 LAB — TROPONIN I (HIGH SENSITIVITY)
Troponin I (High Sensitivity): 934 ng/L (ref <18)
Troponin I (High Sensitivity): 711 ng/L (ref <18)
Troponin I (High Sensitivity): 674 ng/L (ref <18)

## 2020-01-16 LAB — BASIC METABOLIC PANEL
Anion gap: 12 (ref 5–15)
BUN: 27 mg/dL — ABNORMAL HIGH (ref 8–23)
CO2: 25 mmol/L (ref 22–32)
Calcium: 8.9 mg/dL (ref 8.9–10.3)
Chloride: 105 mmol/L (ref 98–111)
Creatinine, Ser: 0.87 mg/dL (ref 0.61–1.24)
GFR, Estimated: >60 mL/min (ref >60)
Glucose, Bld: 121 mg/dL — ABNORMAL HIGH (ref 70–99)
Potassium: 3.3 mmol/L — ABNORMAL LOW (ref 3.5–5.1)
Sodium: 142 mmol/L (ref 135–145)

## 2020-01-16 LAB — MAGNESIUM
Magnesium: 1.8 mg/dL (ref 1.7–2.4)
Magnesium: 2.2 mg/dL (ref 1.7–2.4)

## 2020-01-16 LAB — PHOSPHORUS
Phosphorus: 2.7 mg/dL (ref 2.5–4.6)
Phosphorus: 2.4 mg/dL — ABNORMAL LOW (ref 2.5–4.6)

## 2020-01-16 MED ORDER — AMIODARONE HCL IN DEXTROSE 360-4.14 MG/200ML-% IV SOLN
INTRAVENOUS | Status: AC
  Administered 2020-01-16: 60 mg/h via INTRAVENOUS
  Filled 2020-01-16: qty 200

## 2020-01-16 MED ORDER — AMIODARONE HCL IN DEXTROSE 360-4.14 MG/200ML-% IV SOLN: 60.0000 mg/h | INTRAVENOUS | Status: DC

## 2020-01-16 MED ORDER — FENTANYL CITRATE (PF) 100 MCG/2ML IJ SOLN
INTRAMUSCULAR | Status: AC
  Administered 2020-01-16: 25 ug via INTRAVENOUS
  Filled 2020-01-16: qty 2

## 2020-01-16 MED ORDER — LORAZEPAM 2 MG/ML IJ SOLN
INTRAMUSCULAR | Status: AC
  Filled 2020-01-16: qty 1

## 2020-01-16 MED ORDER — AMIODARONE LOAD VIA INFUSION
150.0000 mg | Freq: Once | INTRAVENOUS | Status: DC
  Filled 2020-01-16: qty 83.34

## 2020-01-16 MED ORDER — DIAZEPAM 5 MG/ML IJ SOLN
2.5000 mg | Freq: Once | INTRAMUSCULAR | Status: AC
  Administered 2020-01-16: 2.5 mg via INTRAVENOUS
  Filled 2020-01-16: qty 2

## 2020-01-16 MED ORDER — FENTANYL CITRATE (PF) 100 MCG/2ML IJ SOLN: 25.0000 ug | Freq: Once | INTRAMUSCULAR | Status: AC

## 2020-01-16 MED ORDER — OXYCODONE HCL 5 MG PO TABS
5.0000 mg | ORAL_TABLET | Freq: Four times a day (QID) | ORAL | Status: DC | PRN
  Administered 2020-01-16: 5 mg via ORAL
  Filled 2020-01-16 (×2): qty 1

## 2020-01-16 MED ORDER — POTASSIUM CHLORIDE 10 MEQ/50ML IV SOLN
10.0000 meq | INTRAVENOUS | Status: AC
  Administered 2020-01-16 – 2020-01-17 (×4): 10 meq via INTRAVENOUS
  Filled 2020-01-16 (×5): qty 50

## 2020-01-16 MED ORDER — LIDOCAINE HCL (CARDIAC) PF 100 MG/5ML IV SOSY
100.0000 mg | PREFILLED_SYRINGE | Freq: Once | INTRAVENOUS | Status: AC
  Administered 2020-01-16: 100 mg via INTRAVENOUS

## 2020-01-16 MED ORDER — MAGNESIUM SULFATE 2 GM/50ML IV SOLN
2.0000 g | Freq: Once | INTRAVENOUS | Status: AC
  Administered 2020-01-16: 2 g via INTRAVENOUS
  Filled 2020-01-16: qty 50

## 2020-01-16 MED ORDER — AMIODARONE HCL IN DEXTROSE 360-4.14 MG/200ML-% IV SOLN: 30.0000 mg/h | INTRAVENOUS | Status: DC

## 2020-01-16 MED ORDER — MAGNESIUM SULFATE 2 GM/50ML IV SOLN
2.0000 g | Freq: Once | INTRAVENOUS | Status: AC
  Administered 2020-01-16: 2 g via INTRAVENOUS

## 2020-01-16 MED ORDER — LORAZEPAM 2 MG/ML IJ SOLN
INTRAMUSCULAR | Status: AC
  Administered 2020-01-16: 1 mg
  Filled 2020-01-16: qty 1

## 2020-01-16 MED ORDER — AMIODARONE IV BOLUS ONLY 150 MG/100ML
150.0000 mg | Freq: Once | INTRAVENOUS | Status: AC
  Administered 2020-01-16: 150 mg via INTRAVENOUS

## 2020-01-16 MED ORDER — AMIODARONE IV BOLUS ONLY 150 MG/100ML
INTRAVENOUS | Status: AC
  Filled 2020-01-16: qty 100

## 2020-01-16 MED ORDER — POTASSIUM CHLORIDE 10 MEQ/100ML IV SOLN
10.0000 meq | INTRAVENOUS | Status: AC
  Administered 2020-01-16 (×6): 10 meq via INTRAVENOUS
  Filled 2020-01-16 (×6): qty 100

## 2020-01-16 MED ORDER — LORAZEPAM 2 MG/ML IJ SOLN: 1.0000 mg | Freq: Once | INTRAMUSCULAR | Status: AC

## 2020-01-16 MED ORDER — SACUBITRIL-VALSARTAN 24-26 MG PO TABS
1.0000 | ORAL_TABLET | Freq: Two times a day (BID) | ORAL | Status: DC
  Administered 2020-01-16 – 2020-01-20 (×5): 1 via ORAL
  Filled 2020-01-16 (×10): qty 1

## 2020-01-16 MED ORDER — LIDOCAINE IN D5W 4-5 MG/ML-% IV SOLN
2.0000 mg/min | INTRAVENOUS | Status: DC
  Administered 2020-01-16: 2 mg/min via INTRAVENOUS
  Administered 2020-01-17 – 2020-01-19 (×6): 4 mg/min via INTRAVENOUS
  Administered 2020-01-19 – 2020-01-20 (×2): 2 mg/min via INTRAVENOUS
  Administered 2020-01-21: 4 mg/min via INTRAVENOUS
  Administered 2020-01-21 – 2020-01-22 (×2): 2 mg/min via INTRAVENOUS
  Filled 2020-01-16 (×14): qty 500

## 2020-01-16 NOTE — Progress Notes (Addendum)
Patient flipped into pulseless V-fib, unresponsive. CPR performed for 20 seconds, patient defibrillated and converted back into baseline bundle branch rhythm with rate in 50's-60's.  Patient alert and responsive, BP stable. Paged Dr. Lady Blystone with cardiology to determine any additional interventions. Discussed current vitals, PMHx, current medications.  P: - 2 g Mg replacement now - precedex drip off - replace other electrolytes PRN - if V-fib arrest occurs again, bolus amiodarone IV x 1   Betsey Holiday, AGACNP-BC Acute Care Nurse Practitioner [facility]   [phone] / [phone] Please see Amion for pager details.

## 2020-01-16 NOTE — Progress Notes (Signed)
CRITICAL CARE NOTE  [name] presenting to the ED on 01/06/2020 in respiratory distress, complaining of chest pain and shortness of breath. He also reported orthopnea and increased lower extremity swelling. History was limited due to acuity of condition and respiratory status. Patient was placed on biPAP, but progressively worsened and was emergently intubated. Patient is a current 0.5ppd smoker and uses marijuana. He has a severe metabolic acidosis. Patient has a past medical history significant for HTN, T2DM, HLD, GERD, CAD (s/p PCI), AVR with bioprosthetic valve, systolic CHF with EF of 87%, and paroxysmal A. Fib, currently anticoagulated on Eliquis. EKG upon arrival to ED revealed sinus rhythm at a rate of 99bpm with RBBB and PVCs with ST elevation in II, III, avF, V4-V6. Cardiologist on call stated that STEMI criteria were not met and advised to manage CHF. Patient received IV diltiazem and reported improvement of symptoms. Labs were notable for high sensitivity troponin 71->91->89. BNP 1046, creatinine 1.26, BUN 21, potassium 5.4. CXR showed improved pulmonary edema since prior study with chronic pulmonary venous hypertension. Notably, patient was admitted to [facility] ED 12/04/2019 for acute respiratory failure and acute on chronic systolic CHF in the setting of COVID pneumonia.   SIGNIFICANT EVENTS 11/2 - severe cardiogenic shock, remains on vent 11/3 - severe respiratory failure, severe systolic CHF 86/7 - patient remains critically ill 11/5 - patient remains critically ill 11/6 - off pressors, extubated to biPAP 11/7 - remains extubated, febrile, MSSA+ tracheal culture - started ancef  11/8 - remains extubated, on 2L by nasal cannula. Afebrile today with white count of 21.3K, up from 15.7K yesterday. Still encephalopathic and drowsy. On and off Precedex. Went into narrow complex SVT, then progressed to wide complex tachycardia was shocked and given a loading dose of amiodarone. Will remain on  amiodarone gtt. Cardiology at bedside.   11/9 - remains extubated, on 2L by nasal cannula. Afebrile today with white count of 19.2K, down since yesterday. BP soft overnight, was started on Neo-synephrine infusion. More alert than yesterday, but only oriented to self. Failed Yale 3oz swallow study early this morning, started coughing at the end of the study. Will need to remain NPO until cleared by SLP. Patient has been educated on this, yet continues to incessantly ask for water.   11/10- patient clinically improved. He is alert communicative and in no distress. He ate entire meal without issues. Still has confusion intermittently during interview. Frequent PVCs on telemetry.  Will initiate OT/PT today for downgrade planning. 11/11- patient minimally symptomatic, he again had episodes of Torsades today s/p 120Jx1 mag 2g.  Poor prognosis overall with advanced CHF.   VITALS BP 97/67   Pulse (!) 151   Temp 97.8 F (36.6 C) (Oral)   Resp 14   Ht 5' 7"  (1.702 m)   Wt 70.7 kg   SpO2 (!) 87%   BMI 24.41 kg/m    I/O last 3 completed shifts: In: 886.6 [I.V.:186.7; IV Piggyback:699.9] Out: 1920 [EHMCN:4709] No intake/output data recorded.  SpO2: (!) 87 % O2 Flow Rate (L/min): 4 L/min FiO2 (%): 32 %  Estimated body mass index is 24.41 kg/m as calculated from the following:   Height as of this encounter: 5' 7"  (1.702 m).   Weight as of this encounter: 70.7 kg.   REVIEW OF SYSTEMS    ROS - 10 point ROS done and is negative except as per subjective findings.    PHYSICAL EXAMINATION  General: Critically ill appearing, deconditioned male, sitting up in bed  in no acute distress Head: Normocephalic, atraumatic Eyes: Pupils equal, round, reactive to light.  No scleral icterus.  Mouth: Moist mucosal membrane Neck: Supple, no JVD Pulmonary: Normal effort of breathing on supplemental oxygen, clear to auscultation bilaterally Cardiovascular: S1 and S2. Regular rate and rhythm. No murmurs,  rubs, or gallops.  Gastrointestinal: Soft, nontender, non-distended.  Positive bowel sounds.   Musculoskeletal: No swelling, clubbing, or edema Neurological: Alert and oriented to self, tracking and occasionally responding to questions Skin: Intact, warm, dry  MEDICATIONS: I have reviewed all medications and confirmed regimen as documented Scheduled Meds: . allopurinol  300 mg Oral Daily  . amiodarone  400 mg Oral BID  . apixaban  5 mg Oral BID  . aspirin EC  81 mg Oral Daily  . atorvastatin  40 mg Oral QHS  . chlorhexidine  15 mL Mouth Rinse BID  . Chlorhexidine Gluconate Cloth  6 each Topical Daily  . feeding supplement  237 mL Oral BID BM  . furosemide  40 mg Intravenous Q12H  . insulin aspart  0-9 Units Subcutaneous TID AC & HS  . mouth rinse  15 mL Mouth Rinse q12n4p  . metoprolol succinate  25 mg Oral Daily  . polyethylene glycol  17 g Oral Daily  . senna-docusate  1 tablet Oral BID  . sodium chloride flush  10-40 mL Intracatheter Q12H  . sodium chloride flush  3 mL Intravenous Q12H   Continuous Infusions: . sodium chloride 10 mL/hr at 01/12/20 1915  . sodium chloride 250 mL (01/12/20 1919)  . amiodarone    . dexmedetomidine (PRECEDEX) IV infusion 1.2 mcg/kg/hr (01/16/20 0013)  . famotidine (PEPCID) IV 20 mg (01/15/20 0947)  . potassium chloride 10 mEq (01/16/20 0719)   PRN Meds:.sodium chloride, acetaminophen, albuterol, diazepam, metoprolol tartrate, ondansetron (ZOFRAN) IV, oxyCODONE, polyethylene glycol, sodium chloride flush, sodium chloride flush   CULTURE RESULTS   Recent Results (from the past 240 hour(s))  Blood culture (routine x 2)     Status: None   Collection Time: 01/06/20  1:13 PM   Specimen: BLOOD  Result Value Ref Range Status   Specimen Description BLOOD BLOOD LEFT HAND  Final   Special Requests   Final    BOTTLES DRAWN AEROBIC AND ANAEROBIC Blood Culture adequate volume   Culture   Final    NO GROWTH 5 DAYS Performed at [facility],  [address] [place], [place] [address]    Report Status 01/11/2020 FINAL  Final  Blood culture (routine x 2)     Status: None   Collection Time: 01/06/20  1:26 PM   Specimen: BLOOD  Result Value Ref Range Status   Specimen Description BLOOD LEFT ANTECUBITAL  Final   Special Requests   Final    BOTTLES DRAWN AEROBIC AND ANAEROBIC Blood Culture adequate volume   Culture   Final    NO GROWTH 5 DAYS Performed at [facility], [address] [place], [place] [address]    Report Status 01/11/2020 FINAL  Final  MRSA PCR Screening     Status: None   Collection Time: 01/06/20  2:43 PM   Specimen: Nasal Mucosa; Nasopharyngeal  Result Value Ref Range Status   MRSA by PCR NEGATIVE NEGATIVE Final    Comment:        The GeneXpert MRSA Assay (FDA approved for NASAL specimens only), is one component of a comprehensive MRSA colonization surveillance program. It is not intended to diagnose MRSA infection nor to guide or monitor  treatment for MRSA infections. Performed at [facility], [address] [place], [place] [address]   Culture, respiratory (non-expectorated)     Status: None   Collection Time: 01/09/20  2:45 AM   Specimen: Tracheal Aspirate; Respiratory  Result Value Ref Range Status   Specimen Description   Final    TRACHEAL ASPIRATE Performed at [facility], [address] [place], [place] [address]    Special Requests   Final    NONE Performed at [facility], [address] [place], [place] [address]    Gram Stain   Final    MODERATE WBC PRESENT, PREDOMINANTLY PMN RARE GRAM POSITIVE COCCI Performed at [facility], [place] [address], [place],  [address]    Culture FEW STAPHYLOCOCCUS AUREUS  Final   Report Status 01/12/2020 FINAL  Final   Organism ID, Bacteria STAPHYLOCOCCUS AUREUS  Final      Susceptibility   Staphylococcus aureus - MIC*    CIPROFLOXACIN <=0.5 SENSITIVE Sensitive     ERYTHROMYCIN >=8  RESISTANT Resistant     GENTAMICIN <=0.5 SENSITIVE Sensitive     OXACILLIN <=0.25 SENSITIVE Sensitive     TETRACYCLINE <=1 SENSITIVE Sensitive     VANCOMYCIN 1 SENSITIVE Sensitive     TRIMETH/SULFA <=10 SENSITIVE Sensitive     CLINDAMYCIN RESISTANT Resistant     RIFAMPIN <=0.5 SENSITIVE Sensitive     Inducible Clindamycin POSITIVE Resistant     * FEW STAPHYLOCOCCUS AUREUS       Intake/Output Summary (Last 24 hours) at 01/16/2020 0801 Last data filed at 01/16/2020 0400 Gross per 24 hour  Intake 430.37 ml  Output 1600 ml  Net -1169.63 ml     LABS  CBC Latest Ref Rng & Units 01/16/2020 01/15/2020 01/14/2020  WBC 4.0 - 10.5 K/uL 13.5(H) 15.7(H) 17.5(H)  Hemoglobin 13.0 - 17.0 g/dL 15.4 15.8 15.9  Hematocrit 39 - 52 % 51.7 51.4 52.3(H)  Platelets 150 - 400 K/uL 868(H) 892(H) 901(HH)   BMP Latest Ref Rng & Units 01/16/2020 01/15/2020 01/14/2020  Glucose 70 - 99 mg/dL 121(H) 149(H) 158(H)  BUN 8 - 23 mg/dL 27(H) 28(H) 34(H)  Creatinine 0.61 - 1.24 mg/dL 0.87 0.77 1.00  Sodium 135 - 145 mmol/L 142 140 146(H)  Potassium 3.5 - 5.1 mmol/L 3.3(L) 3.4(L) 4.2  Chloride 98 - 111 mmol/L 105 104 108  CO2 22 - 32 mmol/L 25 26 28   Calcium 8.9 - 10.3 mg/dL 8.9 9.0 9.3      IMAGING    No results found.   Nutrition Status: Nutrition Problem: Moderate Malnutrition Etiology: chronic illness (CHF) Signs/Symptoms: moderate fat depletion, moderate muscle depletion Interventions: Tube feeding, MVI   Indwelling Urinary Catheter continued, requirement due to   Reason to continue Indwelling Urinary Catheter strict Intake/Output monitoring for hemodynamic instability   Central Line/ continued, requirement due to  Reason to continue [employer] Monitoring of central venous pressure or other hemodynamic parameters and poor IV access     ASSESSMENT AND PLAN SYNOPSIS [name] with severe respiratory failure due to metabolic acidosis with severe systolic CHF exacerbation, previous  history of COVID-19 in the setting of drug abuse, undergoing therapy for HCAP, complicated by tachyarrhythmia and progressive heart failure.  Severe ACUTE Hypoxic and Hypercapnic Respiratory Failure - possible related to emphysema and COPD with active smoking of tobacco and drugs.  - Continue bronchodilator therapy  ACUTE on chronic SYSTOLIC CARDIAC FAILURE- EF 20% - Oxygen as needed - Lasix as tolerated - Follow  up cardiac enzymes as indicated - cardiology on case appreciate input  NEUROLOGY - Acute toxic metabolic encephalopathy - Start Valium 2.33m q6 - Continue Precedex - Continue Thiamine infusion  Regular wide complex tachyarrythmia  - SVT with aberarnt conduction ? -  - S/p electrical cardioversion - Use pressors as needed to keep MAP >55 - Continue amiodarone -400 bID PO  -cardiology on case appreciate    CARDIAC - ICU monitoring - Telemetry showing sinus rhythm with PVCs  ID - therapy for HCAP - Tracheal culture positive for MSSA - Continue IV abx as prescibed  GI - GI PROPHYLAXIS as indicated   DIET - NPO - Failed Yale 3oz swallow study today - Pending SLP evaluation - Constipation protocol as indicated  ENDO - Will use ICU hypoglycemic\Hyperglycemia protocol if indicated  ELECTROLYTES - Follow labs as needed - Replace as needed - Pharmacy consultation and following   DVT/GI PRX ordered and assessed TRANSFUSIONS AS NEEDED MONITOR FSBS I Assessed the need for Labs I Assessed the need for Foley I Assessed the need for Central Venous Line Family Discussion when available I Assessed the need for Mobilization I made an Assessment of medications to be adjusted accordingly Safety Risk assessment completed   CASE DISCUSSED IN MULTIDISCIPLINARY ROUNDS WITH ICU TEAM  Critical Care Time devoted to patient care services described in this note is 38 minutes.   Overall, patient is critically ill, prognosis is guarded.  Patient with Multiorgan failure and  at high risk for cardiac arrest and death.   I would recommend DNR/DNI status Recommend Palliative Care consultation   FOttie Glazier M.D.  Pulmonary & [place]

## 2020-01-16 NOTE — Progress Notes (Signed)
Patient became unresponsive, agonal breathing, with pulseless V-fib/ torsades on the monitor.  Before any intervention could be performed, patient awake and alert asking "what happened".  Currently in his normal bundle branch block rhythm, rate in the 50's, with increased multifocal PVC's. Patient has no recollection of preceding events, no c/o pain or palpitations currently. BP stable.  P: - draw AM labs STAT, replace electrolytes PRN - check troponin - alert cardiology, to determine if additional interventions are warranted  Cheryll Cockayne Rust-Chester, AGACNP-BC Acute Care Nurse Practitioner [facility]   [phone] / [phone] Please see Amion for pager details.

## 2020-01-16 NOTE — Progress Notes (Signed)
Patient flipped into wide QRS tachycardia with rate 150's-160's.  Patient pre-medicated with 25 mcg of fentanyl, synchronized defibrillation at 200 J, and lidocaine drip increased from 2 mg/min continuous infusion to 4 mg/min continuous infusion. Patient still receiving potassium replacement IV. Will continue to monitor patient closely.   Cheryll Cockayne Rust-Chester, AGACNP-BC Acute Care Nurse Practitioner [facility]   [phone] / [phone] Please see Amion for pager details.

## 2020-01-16 NOTE — Progress Notes (Signed)
Pt into monomorphic VT 170s at 1809. Dr.Aleskerov at bedside, requested 2.5mg  IV metoprolol. Improvement in HR but fleeting, back into 170s within minutes. Pt then given 1mg  ativan for synchronized cardioversion x1 at 120 joules with good effect. Pt now in sinus with rate in 90s.

## 2020-01-16 NOTE — Progress Notes (Signed)
19:20 Patient flipped into wide QRS V-tach HR 160's-180's, patient remains alert and oriented, other VSS on 2 L [place]. - Amiodarone bolus 150 mg ordered and started, to be followed by amiodarone infusion. PO amiodarone discontinued.  19:26- pre-medicated with ativan & performed synchronized defibrillation with 150 J with very brief return to baseline rhythm in the 90's.  19:28- sustaining wide QRS tachycardia HR 140's-180's, performed synchronized defibrillation 200 J with return to baseline rhythm.  20:00 - patient again in wide QRS tachycardia HR 140's -180's. Patient remains alert and oriented, hemodynamically stable on 2 L [employer]. Ordered potassium replacement based off of 19:00 lab results. Patient pre-medicated with 25 mcg of fentanyl IVP, synchronized defibrillation with 200 J with a very brief return to baseline rhythm before flipping back into wide QRS tachycardia.  20:20 - lidocaine IVP ordered and given with return to baseline rhythm. Patient sustaining post lidocaine injection.  Lidocaine infusion ordered, amiodarone discontinued.   Will continue to monitor the patient closely.   Cheryll Cockayne Rust-Chester, AGACNP-BC Acute Care Nurse Practitioner [facility]   [phone] / [phone] Please see Amion for pager details.

## 2020-01-16 NOTE — Progress Notes (Signed)
PHARMACY CONSULT NOTE - FOLLOW UP  Pharmacy Consult for Electrolyte Monitoring and Replacement   Recent Labs: Potassium (mmol/L)  Date Value  01/16/2020 3.3 (L)   Magnesium (mg/dL)  Date Value  27/78/2423 1.8   Calcium (mg/dL)  Date Value  53/61/4431 8.9   Albumin (g/dL)  Date Value  54/00/8676 2.6 (L)   Phosphorus (mg/dL)  Date Value  19/50/9326 2.7   Sodium (mmol/L)  Date Value  01/16/2020 142   Assessment: Patient flipped into pulseless V-fib, unresponsive. CPR performed for 20 seconds, patient defibrillated and converted back into baseline bundle branch rhythm with rate in 50's-60's.  Pt received Mg 2gm and orders received for KCL replacement.  Goal of Therapy:  Lytes WNL, K >/= 4 in ICU  Plan:  Pt currently receiving KCL IV runs x 6 ( total) Recheck lytes after runs complete  Wayland Denis ,PharmD Clinical Pharmacist 01/16/2020 4:14 AM

## 2020-01-16 NOTE — TOC Progression Note (Signed)
Transition of Care [facility]) - Progression Note    Patient Details  Name: Travis Maxwell MRN: [id number] Date of Birth: [birth year]  [facility]) CM/SW Contact  Marina Goodell Phone Number: [phone] 01/16/2020, 4:04 PM  Clinical Narrative:     Patient awake, alert and agitated, has stated he wants to go home AMA.  Required defibrillation 01/15/2020.        Expected Discharge Plan and Services                                                 Social Determinants of Health (SDOH) Interventions    Readmission Risk Interventions No flowsheet data found.

## 2020-01-16 NOTE — Progress Notes (Signed)
PHARMACY CONSULT NOTE - FOLLOW UP  Pharmacy Consult for Electrolyte Monitoring and Replacement   Recent Labs: Potassium (mmol/L)  Date Value  01/16/2020 3.3 (L)   Magnesium (mg/dL)  Date Value  38/18/2993 1.8   Calcium (mg/dL)  Date Value  71/69/6789 8.9   Albumin (g/dL)  Date Value  38/12/1749 2.6 (L)   Phosphorus (mg/dL)  Date Value  02/58/5277 2.7   Sodium (mmol/L)  Date Value  01/16/2020 142   Assessment: 69 year old male transferred to ICU for acute toxic metabolic encephalopathy. Patient now more alert and awake. Overnight 11/10, patient with two runs pulseless VT, with second episode requiring CPR and defibrillation. Converted back into baseline bundle branch rhythm with rate in 50's-60's.  Pt received magnesium 2 gm during episode. Pharmacy to manage electrolytes.  Goal of Therapy:  Electrolytes WNL  Plan:  Patient receiving adequate replacement. Will recheck electrolytes with morning labs.  Pricilla Riffle ,PharmD Clinical Pharmacist 01/16/2020 12:26 PM

## 2020-01-16 NOTE — Progress Notes (Signed)
[facility]  SUBJECTIVE: Patient laying in bed, argumentative, upset that the nurses will not allow him to get out of bed, wants to leave and go home   Vitals:   01/16/20 0300 01/16/20 0400 01/16/20 0500 01/16/20 0600  BP: 106/70 (!) 87/54 96/62 97/67   Pulse: (!) 52 (!) 151    Resp: (!) 29 (!) 21 (!) 27 14  Temp:  97.8 F (36.6 C)    TempSrc:  Oral    SpO2: 99% (!) 87%    Weight:      Height:         Intake/Output Summary (Last 24 hours) at 01/16/2020 0747 Last data filed at 01/16/2020 0400 Gross per 24 hour  Intake 430.37 ml  Output 1600 ml  Net -1169.63 ml      PHYSICAL EXAM  General: Well developed, well nourished, in no acute distress HEENT:  Normocephalic and atramatic Neck:  No JVD.  Lungs: Clear bilaterally to auscultation and percussion. Heart: HRRR . Normal S1 and S2 without gallops or murmurs.  Abdomen: Bowel sounds are positive, abdomen soft and non-tender  Msk:  Back normal, normal gait. Normal strength and tone for age. Extremities: No clubbing, cyanosis or edema.   Neuro: Alert and oriented X 3. Psych:  Good affect, responds appropriately   LABS: Basic Metabolic Panel: Recent Labs    01/15/20 0445 01/16/20 0245  NA 140 142  K 3.4* 3.3*  CL 104 105  CO2 26 25  GLUCOSE 149* 121*  BUN 28* 27*  CREATININE 0.77 0.87  CALCIUM 9.0 8.9  MG 1.9 1.8  PHOS 2.9 2.7   Liver Function Tests: Recent Labs    01/15/20 0445  AST 31  ALT 16  ALKPHOS 66  BILITOT 1.2  PROT 7.0  ALBUMIN 2.6*   No results for input(s): LIPASE, AMYLASE in the last 72 hours. CBC: Recent Labs    01/15/20 0445 01/16/20 0245  WBC 15.7* 13.5*  NEUTROABS 11.1* 8.7*  HGB 15.8 15.4  HCT 51.4 51.7  MCV 66.1* 67.2*  PLT 892* 868*   Cardiac Enzymes: No results for input(s): CKTOTAL, CKMB, CKMBINDEX, TROPONINI in the last 72 hours. BNP: Invalid input(s): POCBNP D-Dimer: No results for input(s): DDIMER in the last 72 hours. Hemoglobin A1C: No results for input(s):  HGBA1C in the last 72 hours. Fasting Lipid Panel: No results for input(s): CHOL, HDL, LDLCALC, TRIG, CHOLHDL, LDLDIRECT in the last 72 hours. Thyroid Function Tests: No results for input(s): TSH, T4TOTAL, T3FREE, THYROIDAB in the last 72 hours.  Invalid input(s): FREET3 Anemia Panel: No results for input(s): VITAMINB12, FOLATE, FERRITIN, TIBC, IRON, RETICCTPCT in the last 72 hours.  No results found.   Echo severe dilated cardiomyopathy, LVEF less than 20%  TELEMETRY: Sinus rhythm at 60 bpm:  ASSESSMENT AND PLAN:  Active Problems:   Acute on chronic systolic congestive heart failure (HCC)   Respiratory failure (HCC)   Malnutrition of moderate degree   Adult failure to thrive   Palliative care by specialist   DNR (do not resuscitate) discussion    1.  Tachyarrhythmia,initial episode of narrow complex SVT on 01/14/2020, which deteriorated into wide complex tachycardia, status post electrical cardioversion, treated with amiodarone drip.  The patient remained in sinus rhythm, amiodarone drip transition to p.o. amiodarone.  Last evening, the patient had 2 episodes of pulseless VT, second episode required CPR and defibrillation.  Patient treated with 2 g of magnesium and has remained in sinus rhythm. 2.Acute on chronic systolic CHFwith LVEF <20% ,  appears euvolemic.  Patient was previously on metoprolol succinate and Entresto as outpatient. 3. Coronary artery disease status post NSTEMI 07/2017, status post DES to mid RCA. Patient presented with chest pain. Troponin borderline elevated and down trending (71-->91-->89), most consistent with myocardial injury (ICD10 IA5) secondary to respiratory failure. 4. Paroxysmal atrial fibrillation on amiodarone at home and Eliquis for stroke prevention. Currently in sinus rhythm, transition to amiodarone 400 mg twice daily. 5. Frequent premature ventricular contractions 6. Aortic stenosis, status post Edwards magna tissue valve in 03/2018. Most  recent echocardiogram 08/2019 shows mild aortic regurgitation. 7. LeukocytosisWBC increasing (15.7 ->17.5 ->21.3->19.2). Patient is on IV antibiotics and afebrile (98.8 F). 8. Tobacco and marijuana use 9. Acute toxic metabolic encephalopathy 10.  Uncooperative, patient expressing displeasure about having to stay in the bed, wishes to go home, poses significant challenge going forward since patient would be a candidate for ICD for primary and secondary prevention would require transfer to a tertiary center  Recommendations  1.  Continue current medications 2.  Continue diuresis 3.  Carefully monitor renal status 4.  Continue Eliquis for stroke prevention 5.  Continue amiodarone 400 mg twice daily 6.  Continue metoprolol succinate 7.  Restart Entresto 8.  When patient less upset, need to start conversation about medical compliance, possible cardiac catheterization and ICD   Marcina Millard, MD, PhD, [facility] 01/16/2020 7:47 AM

## 2020-01-17 DIAGNOSIS — R410 Disorientation, unspecified: Secondary | ICD-10-CM

## 2020-01-17 LAB — CBC WITH DIFFERENTIAL/PLATELET
Abs Immature Granulocytes: 0.21 10*3/uL — ABNORMAL HIGH (ref 0.00–0.07)
Basophils Absolute: 0.3 10*3/uL — ABNORMAL HIGH (ref 0.0–0.1)
Basophils Relative: 1 %
Eosinophils Absolute: 0.4 10*3/uL (ref 0.0–0.5)
Eosinophils Relative: 2 %
HCT: 54.8 % — ABNORMAL HIGH (ref 39.0–52.0)
Hemoglobin: 17.1 g/dL — ABNORMAL HIGH (ref 13.0–17.0)
Immature Granulocytes: 1 %
Lymphocytes Relative: 10 %
Lymphs Abs: 1.9 10*3/uL (ref 0.7–4.0)
MCH: 20.8 pg — ABNORMAL LOW (ref 26.0–34.0)
MCHC: 31.2 g/dL (ref 30.0–36.0)
MCV: 66.6 fL — ABNORMAL LOW (ref 80.0–100.0)
Monocytes Absolute: 1.4 10*3/uL — ABNORMAL HIGH (ref 0.1–1.0)
Monocytes Relative: 8 %
Neutro Abs: 13.9 10*3/uL — ABNORMAL HIGH (ref 1.7–7.7)
Neutrophils Relative %: 78 %
Platelets: 1163 10*3/uL (ref 150–400)
RBC: 8.23 MIL/uL — ABNORMAL HIGH (ref 4.22–5.81)
RDW: 25.7 % — ABNORMAL HIGH (ref 11.5–15.5)
Smear Review: INCREASED
WBC: 18.1 10*3/uL — ABNORMAL HIGH (ref 4.0–10.5)
nRBC: 0 % (ref 0.0–0.2)

## 2020-01-17 LAB — BASIC METABOLIC PANEL
Anion gap: 17 — ABNORMAL HIGH (ref 5–15)
BUN: 22 mg/dL (ref 8–23)
CO2: 20 mmol/L — ABNORMAL LOW (ref 22–32)
Calcium: 9 mg/dL (ref 8.9–10.3)
Chloride: 105 mmol/L (ref 98–111)
Creatinine, Ser: 1.29 mg/dL — ABNORMAL HIGH (ref 0.61–1.24)
GFR, Estimated: >60 mL/min (ref >60)
Glucose, Bld: 248 mg/dL — ABNORMAL HIGH (ref 70–99)
Potassium: 3.4 mmol/L — ABNORMAL LOW (ref 3.5–5.1)
Sodium: 142 mmol/L (ref 135–145)

## 2020-01-17 LAB — GLUCOSE, CAPILLARY
Glucose-Capillary: 143 mg/dL — ABNORMAL HIGH (ref 70–99)
Glucose-Capillary: 170 mg/dL — ABNORMAL HIGH (ref 70–99)
Glucose-Capillary: 170 mg/dL — ABNORMAL HIGH (ref 70–99)
Glucose-Capillary: 254 mg/dL — ABNORMAL HIGH (ref 70–99)

## 2020-01-17 LAB — MAGNESIUM: Magnesium: 2.2 mg/dL (ref 1.7–2.4)

## 2020-01-17 LAB — PHOSPHORUS: Phosphorus: 3 mg/dL (ref 2.5–4.6)

## 2020-01-17 MED ORDER — PHENYLEPHRINE CONCENTRATED 100MG/250ML (0.4 MG/ML) INFUSION SIMPLE
0.0000 ug/min | INTRAVENOUS | Status: DC
  Administered 2020-01-17: 20 ug/min via INTRAVENOUS
  Administered 2020-01-18: 50 ug/min via INTRAVENOUS
  Administered 2020-01-19: 80 ug/min via INTRAVENOUS
  Administered 2020-01-20: 60 ug/min via INTRAVENOUS
  Administered 2020-01-21: 100 ug/min via INTRAVENOUS
  Administered 2020-01-22: 40 ug/min via INTRAVENOUS
  Filled 2020-01-17 (×6): qty 250

## 2020-01-17 MED ORDER — HALOPERIDOL LACTATE 5 MG/ML IJ SOLN
2.0000 mg | Freq: Four times a day (QID) | INTRAMUSCULAR | Status: DC | PRN
  Administered 2020-01-17 – 2020-01-31 (×7): 2 mg via INTRAVENOUS
  Filled 2020-01-17 (×8): qty 1

## 2020-01-17 MED ORDER — POTASSIUM CHLORIDE 10 MEQ/50ML IV SOLN
10.0000 meq | INTRAVENOUS | Status: AC
  Administered 2020-01-17 (×4): 10 meq via INTRAVENOUS
  Filled 2020-01-17 (×4): qty 50

## 2020-01-17 MED ORDER — FENTANYL CITRATE (PF) 100 MCG/2ML IJ SOLN: 25.0000 ug | Freq: Once | INTRAMUSCULAR | Status: AC

## 2020-01-17 MED ORDER — HALOPERIDOL LACTATE 5 MG/ML IJ SOLN
2.0000 mg | Freq: Once | INTRAMUSCULAR | Status: AC
  Administered 2020-01-17: 2 mg via INTRAVENOUS
  Filled 2020-01-17: qty 1

## 2020-01-17 MED ORDER — DIAZEPAM 5 MG/ML IJ SOLN
INTRAMUSCULAR | Status: AC
  Administered 2020-01-17: 2.5 mg via INTRAVENOUS
  Filled 2020-01-17: qty 2

## 2020-01-17 MED ORDER — FENTANYL CITRATE (PF) 100 MCG/2ML IJ SOLN
INTRAMUSCULAR | Status: AC
  Administered 2020-01-17: 25 ug via INTRAVENOUS
  Filled 2020-01-17: qty 2

## 2020-01-17 MED ORDER — ZIPRASIDONE MESYLATE 20 MG IM SOLR
20.0000 mg | Freq: Once | INTRAMUSCULAR | Status: DC
  Filled 2020-01-17: qty 20

## 2020-01-17 MED ORDER — FENTANYL CITRATE (PF) 100 MCG/2ML IJ SOLN
25.0000 ug | Freq: Once | INTRAMUSCULAR | Status: AC
  Administered 2020-01-17: 25 ug via INTRAVENOUS

## 2020-01-17 MED ORDER — DIAZEPAM 5 MG/ML IJ SOLN: 2.5000 mg | Freq: Once | INTRAMUSCULAR | Status: AC

## 2020-01-17 MED ORDER — PHENYLEPHRINE HCL-NACL 10-0.9 MG/250ML-% IV SOLN
0.0000 ug/min | INTRAVENOUS | Status: DC
  Filled 2020-01-17: qty 250

## 2020-01-17 NOTE — Progress Notes (Signed)
Patient has been increasingly agitated throughout the shift.  - Precedex drip restarted - valium PRN given, RN attempted to give oxycodone PRN but pt refused. - additional valium IVP given without effect  03:45 patient continuously attempting to get OOB, fighting against staff no longer redirectable, HR 120's. - fentanyl 25 mcg IVP administered with no effect - Precedex drip increased to 1.6 - second fentanyl 25 mcg IVP administered - Geodon ordered, but not used at this time  Patient began to calm down, will consider using Geodon if patient becomes agitated on the precedex drip. Safety sitter remains at bedside.   Cheryll Cockayne Rust-Chester, AGACNP-BC Acute Care Nurse Practitioner [facility]   [phone] / [phone] Please see Amion for pager details.

## 2020-01-17 NOTE — TOC Progression Note (Signed)
Transition of Care [facility]) - Progression Note    Patient Details  Name: Travis Maxwell MRN: [id number] Date of Birth: 12-15-1950  Transition of Care [facility]) CM/SW Contact  Marina Goodell Phone Number: [phone] 01/17/2020, 3:36 PM  Clinical Narrative:     01/17/2020 - Patient required fibrillation on 01/16/2020 - Cardiology consulted.  Pending PT/OT/SP evaluations for transition of care.  Remains full code.   Expected Discharge Plan: Skilled Nursing Facility Barriers to Discharge: Continued Medical Work up, Other (comment) (Patient continues to be medically unstable.)  Expected Discharge Plan and Services Expected Discharge Plan: Skilled Nursing Facility In-house Referral: Clinical Social Work   Post Acute Care Choice: Skilled Nursing Facility Living arrangements for the past 2 months: Single Family Home                                       Social Determinants of Health (SDOH) Interventions    Readmission Risk Interventions No flowsheet data found.

## 2020-01-17 NOTE — Progress Notes (Signed)
CRITICAL CARE NOTE  [name] presenting to the ED on 01/06/2020 in respiratory distress, complaining of chest pain and shortness of breath. He also reported orthopnea and increased lower extremity swelling. History was limited due to acuity of condition and respiratory status. Patient was placed on biPAP, but progressively worsened and was emergently intubated. Patient is a current 0.5ppd smoker and uses marijuana. He has a severe metabolic acidosis. Patient has a past medical history significant for HTN, T2DM, HLD, GERD, CAD (s/p PCI), AVR with bioprosthetic valve, systolic CHF with EF of 20%, and paroxysmal A. Fib, currently anticoagulated on Eliquis. EKG upon arrival to ED revealed sinus rhythm at a rate of 99bpm with RBBB and PVCs with ST elevation in II, III, avF, V4-V6. Cardiologist on call stated that STEMI criteria were not met and advised to manage CHF. Patient received IV diltiazem and reported improvement of symptoms. Labs were notable for high sensitivity troponin 71->91->89. BNP 1046, creatinine 1.26, BUN 21, potassium 5.4. CXR showed improved pulmonary edema since prior study with chronic pulmonary venous hypertension. Notably, patient was admitted to [facility] ED 12/04/2019 for acute respiratory failure and acute on chronic systolic CHF in the setting of COVID pneumonia.   SIGNIFICANT EVENTS 11/2 - severe cardiogenic shock, remains on vent 11/3 - severe respiratory failure, severe systolic CHF 11/4 - patient remains critically ill 11/5 - patient remains critically ill 11/6 - off pressors, extubated to biPAP 11/7 - remains extubated, febrile, MSSA+ tracheal culture - started ancef  11/8 - remains extubated, on 2L by nasal cannula. Afebrile today with white count of 21.3K, up from 15.7K yesterday. Still encephalopathic and drowsy. On and off Precedex. Went into narrow complex SVT, then progressed to wide complex tachycardia was shocked and given a loading dose of amiodarone. Will remain on  amiodarone gtt. Cardiology at bedside.   11/9 - remains extubated, on 2L by nasal cannula. Afebrile today with white count of 19.2K, down since yesterday. BP soft overnight, was started on Neo-synephrine infusion. More alert than yesterday, but only oriented to self. Failed Yale 3oz swallow study early this morning, started coughing at the end of the study. Will need to remain NPO until cleared by SLP. Patient has been educated on this, yet continues to incessantly ask for water.   11/10- patient clinically improved. He is alert communicative and in no distress. He ate entire meal without issues. Still has confusion intermittently during interview. Frequent PVCs on telemetry.  Will initiate OT/PT today for downgrade planning. 11/11- patient minimally symptomatic, he again had episodes of Torsades today s/p 120Jx1 mag 2g.  Poor prognosis overall with advanced CHF.  11/12- Dicussed case with cardiology. Antiarrythmic agents being modified. Will also call consult for Electrophysiology   VITALS BP (!) 112/97   Pulse (!) 55   Temp 98.2 F (36.8 C) (Axillary)   Resp (!) 25   Ht 5\' 7"  (1.702 m)   Wt 70.7 kg   SpO2 98%   BMI 24.41 kg/m    I/O last 3 completed shifts: In: 1095.9 [I.V.:835.3; IV Piggyback:260.6] Out: 3550 [Urine:3550] Total I/O In: 455.9 [I.V.:307.6; IV Piggyback:148.4] Out: 500 [Urine:500]  SpO2: 98 % O2 Flow Rate (L/min): 2 L/min FiO2 (%): 32 %  Estimated body mass index is 24.41 kg/m as calculated from the following:   Height as of this encounter: 5\' 7"  (1.702 m).   Weight as of this encounter: 70.7 kg.   REVIEW OF SYSTEMS    ROS - 10 point ROS done and is  negative except as per subjective findings.    PHYSICAL EXAMINATION  General: Critically ill appearing, deconditioned male, sitting up in bed in no acute distress Head: Normocephalic, atraumatic Eyes: Pupils equal, round, reactive to light.  No scleral icterus.  Mouth: Moist mucosal membrane Neck: Supple,  no JVD Pulmonary: Normal effort of breathing on supplemental oxygen, clear to auscultation bilaterally Cardiovascular: S1 and S2. Regular rate and rhythm. No murmurs, rubs, or gallops.  Gastrointestinal: Soft, nontender, non-distended.  Positive bowel sounds.   Musculoskeletal: No swelling, clubbing, or edema Neurological: Alert and oriented to self, tracking and occasionally responding to questions Skin: Intact, warm, dry  MEDICATIONS: I have reviewed all medications and confirmed regimen as documented Scheduled Meds: . allopurinol  300 mg Oral Daily  . apixaban  5 mg Oral BID  . aspirin EC  81 mg Oral Daily  . atorvastatin  40 mg Oral QHS  . chlorhexidine  15 mL Mouth Rinse BID  . Chlorhexidine Gluconate Cloth  6 each Topical Daily  . feeding supplement  237 mL Oral BID BM  . insulin aspart  0-9 Units Subcutaneous TID AC & HS  . mouth rinse  15 mL Mouth Rinse q12n4p  . metoprolol succinate  25 mg Oral Daily  . polyethylene glycol  17 g Oral Daily  . sacubitril-valsartan  1 tablet Oral BID  . senna-docusate  1 tablet Oral BID  . sodium chloride flush  10-40 mL Intracatheter Q12H  . sodium chloride flush  3 mL Intravenous Q12H   Continuous Infusions: . sodium chloride 10 mL/hr at 01/12/20 1915  . sodium chloride 250 mL (01/12/20 1919)  . dexmedetomidine (PRECEDEX) IV infusion 0.6 mcg/kg/hr (01/17/20 1117)  . famotidine (PEPCID) IV Stopped (01/16/20 1140)  . lidocaine 4 mg/min (01/17/20 1117)  . phenylephrine (NEO-SYNEPHRINE) Adult infusion 30 mcg/min (01/17/20 1117)   PRN Meds:.sodium chloride, acetaminophen, albuterol, diazepam, metoprolol tartrate, ondansetron (ZOFRAN) IV, oxyCODONE, polyethylene glycol, sodium chloride flush, sodium chloride flush   CULTURE RESULTS   Recent Results (from the past 240 hour(s))  Culture, respiratory (non-expectorated)     Status: None   Collection Time: 01/09/20  2:45 AM   Specimen: Tracheal Aspirate; Respiratory  Result Value Ref Range  Status   Specimen Description   Final    TRACHEAL ASPIRATE Performed at [facility], [address] [place], [place] [address]    Special Requests   Final    NONE Performed at [facility], [address] [place], [place] [address]    Gram Stain   Final    MODERATE WBC PRESENT, PREDOMINANTLY PMN RARE GRAM POSITIVE COCCI Performed at [facility], [place] [address] [place], [place] [address]    Culture FEW STAPHYLOCOCCUS AUREUS  Final   Report Status 01/12/2020 FINAL  Final   Organism ID, Bacteria STAPHYLOCOCCUS AUREUS  Final      Susceptibility   Staphylococcus aureus - MIC*    CIPROFLOXACIN <=0.5 SENSITIVE Sensitive     ERYTHROMYCIN >=8 RESISTANT Resistant     GENTAMICIN <=0.5 SENSITIVE Sensitive     OXACILLIN <=0.25 SENSITIVE Sensitive     TETRACYCLINE <=1 SENSITIVE Sensitive     VANCOMYCIN 1 SENSITIVE Sensitive     TRIMETH/SULFA <=10 SENSITIVE Sensitive     CLINDAMYCIN RESISTANT Resistant     RIFAMPIN <=0.5 SENSITIVE Sensitive     Inducible Clindamycin POSITIVE Resistant     * FEW STAPHYLOCOCCUS AUREUS       Intake/Output Summary (Last 24 hours) at 01/17/2020 1401 Last  data filed at 01/17/2020 1150 Gross per 24 hour  Intake 1400.61 ml  Output 2650 ml  Net -1249.39 ml     LABS  CBC Latest Ref Rng & Units 01/17/2020 01/16/2020 01/15/2020  WBC 4.0 - 10.5 K/uL 18.1(H) 13.5(H) 15.7(H)  Hemoglobin 13.0 - 17.0 g/dL 17.1(H) 15.4 15.8  Hematocrit 39 - 52 % 54.8(H) 51.7 51.4  Platelets 150 - 400 K/uL 1,163(HH) 868(H) 892(H)   BMP Latest Ref Rng & Units 01/17/2020 01/16/2020 01/16/2020  Glucose 70 - 99 mg/dL 248(H) 165(H) 121(H)  BUN 8 - 23 mg/dL 22 24(H) 27(H)  Creatinine 0.61 - 1.24 mg/dL 1.29(H) 0.92 0.87  Sodium 135 - 145 mmol/L 142 138 142  Potassium 3.5 - 5.1 mmol/L 3.4(L) 3.4(L) 3.3(L)  Chloride 98 - 111 mmol/L 105 101 105  CO2 22 - 32 mmol/L 20(L) 23 25  Calcium 8.9 - 10.3 mg/dL 9.0 8.7(L) 8.9      IMAGING    No results  found.   Nutrition Status: Nutrition Problem: Moderate Malnutrition Etiology: chronic illness (CHF) Signs/Symptoms: moderate fat depletion, moderate muscle depletion Interventions: Tube feeding, MVI   Indwelling Urinary Catheter continued, requirement due to   Reason to continue Indwelling Urinary Catheter strict Intake/Output monitoring for hemodynamic instability   Central Line/ continued, requirement due to  Reason to continue [employer] Monitoring of central venous pressure or other hemodynamic parameters and poor IV access     ASSESSMENT AND PLAN SYNOPSIS [name] with severe respiratory failure due to metabolic acidosis with severe systolic CHF exacerbation, previous history of COVID-19 in the setting of drug abuse, undergoing therapy for HCAP, complicated by tachyarrhythmia and progressive heart failure.  Severe ACUTE Hypoxic and Hypercapnic Respiratory Failure - possible related to emphysema and COPD with active smoking of tobacco and drugs.  - Continue bronchodilator therapy  ACUTE on chronic SYSTOLIC CARDIAC FAILURE- EF 20% - Oxygen as needed - Lasix as tolerated - cardiology on case appreciate input  NEUROLOGY - Acute toxic metabolic encephalopathy - Start Valium 2.27m q6 - Continue Precedex - Continue Thiamine infusion  Regular wide complex tachyarrythmia  - SVT with aberarnt conduction ? -  - S/p electrical cardioversion - Use pressors as needed to keep MAP >55 - Continue amiodarone -400 bID PO >>Amio gtt>>>lidocaine gtt>>mixeltine per cardiology  -cardiology on case appreciate  -EP cpnsult today     ID - therapy for CAP - Tracheal culture positive for MSSA - Continue IV abx as prescibed  GI - GI PROPHYLAXIS as indicated   DIET - NPO - Failed Yale 3oz swallow study today - Pending SLP evaluation - Constipation protocol as indicated  ENDO - Will use ICU hypoglycemic\Hyperglycemia protocol if indicated  ELECTROLYTES - Follow labs as  needed - Replace as needed - Pharmacy consultation and following   DVT/GI PRX ordered and assessed TRANSFUSIONS AS NEEDED MONITOR FSBS I Assessed the need for Labs I Assessed the need for Foley I Assessed the need for Central Venous Line Family Discussion when available I Assessed the need for Mobilization I made an Assessment of medications to be adjusted accordingly Safety Risk assessment completed   CASE DISCUSSED IN MULTIDISCIPLINARY ROUNDS WITH ICU TEAM  Critical Care Time devoted to patient care services described in this note is 33 minutes.   Overall, patient is critically ill, prognosis is guarded.  Patient with Multiorgan failure and at high risk for cardiac arrest and death.   I would recommend DNR/DNI status Recommend Palliative Care consultation   FOttie Glazier M.D.  Pulmonary & [place]

## 2020-01-17 NOTE — Progress Notes (Signed)
PHARMACY CONSULT NOTE - FOLLOW UP  Pharmacy Consult for Electrolyte Monitoring and Replacement   Recent Labs: Potassium (mmol/L)  Date Value  01/17/2020 3.4 (L)   Magnesium (mg/dL)  Date Value  09/81/1914 2.2   Calcium (mg/dL)  Date Value  78/29/5621 9.0   Albumin (g/dL)  Date Value  30/86/5784 3.2 (L)   Phosphorus (mg/dL)  Date Value  69/62/9528 3.0   Sodium (mmol/L)  Date Value  01/17/2020 142   Assessment: 69 year old male transferred to ICU for acute toxic metabolic encephalopathy. Patient now more alert and awake. Overnight 11/10, patient with two runs pulseless VT, with second episode requiring CPR and defibrillation. Converted back into baseline bundle branch rhythm with rate in 50's-60's.  Pt received magnesium 2 gm during episode. Similar episodes 11/11 requiring defibrillation. Pharmacy to manage electrolytes.  Goal of Therapy:  Electrolytes WNL  Plan:  Patient received adequate replacement. Will recheck electrolytes with morning labs.  Pricilla Riffle ,PharmD Clinical Pharmacist 01/17/2020 1:44 PM

## 2020-01-17 NOTE — Progress Notes (Signed)
[facility] Cardiology  Patient Description: Mr. Franko is a 69 year old male PMH significant for CAD, NSTEMI s/p DES to mid RCA (07/2017), chronic systolic CHF (LVEF <20%), aortic insufficieny, aortic stenosis s/p AVR [facility] tissue, 2020), paroxsymal atrial fibrillation (on Eliquis), HTN, HLD, DM Type II, GERD, tobacco use and marijuana use who was admitted to [facility] hospital for severe acute hypoxic and hypercapnic respiratory failure and acute on chronic systolic CHF.   SUBJECTIVE: Due to patient AMS he is unable to respond appropriately to any questions at this time.   OBJECTIVE: The patient is lethargic and disoriented. He appears euvolemic and has only a small amount of urine output in his foley catheter bag. He is in the ICU bed with rails up and a safely monitor at the bedside. The patient appears to be resting comfortably in bed. On last night, the patient went into wide complex ventricular tachycardia that was initially treated with IV amiodarone without success. Synchronized defibrillation was performed on the patient 3 times throughout the night, the amiodarone was discontinued and the patient was started on a Lidocaine drip and his potassium was replaced with successful conversion back to his baseline rhythm. The patient was also severely agitated and confused on last night which required Geodon therapy.   Vitals:   01/17/20 1056 01/17/20 1100 01/17/20 1102 01/17/20 1200  BP: (!) 72/53 (!) 78/59 91/74 (!) 112/97  Pulse: (!) 55     Resp: 15  20 (!) 25  Temp:    98.2 F (36.8 C)  TempSrc:    Axillary  SpO2: 98%     Weight:      Height:         Intake/Output Summary (Last 24 hours) at 01/17/2020 1351 Last data filed at 01/17/2020 1150 Gross per 24 hour  Intake 1400.61 ml  Output 2650 ml  Net -1249.39 ml      PHYSICAL EXAM  General: appears lethargic, appears dishelved, well nourished, in no acute distress HEENT:  Normocephalic and atramatic Neck:  No JVD. Supple.  Lungs:  Clear bilaterally to auscultation. Chest expansion symmetrical, normal effort of breathing. Dry cough noted.  Heart: HRRR . Normal S1 and S2 without gallops or murmurs.  Abdomen: Bowel sounds are positive, abdomen soft and non-tender  Msk:  Normal strength and tone for age. Extremities: No clubbing, cyanosis or edema.   Neuro: lethargic and disooriented X 3. Psych:  flat affect, responds inappropriately   LABS: Basic Metabolic Panel: Recent Labs    01/16/20 1900 01/17/20 0430  NA 138 142  K 3.4* 3.4*  CL 101 105  CO2 23 20*  GLUCOSE 165* 248*  BUN 24* 22  CREATININE 0.92 1.29*  CALCIUM 8.7* 9.0  MG 2.2 2.2  PHOS 2.4* 3.0   Liver Function Tests: Recent Labs    01/15/20 0445 01/16/20 1900  AST 31 29  ALT 16 13  ALKPHOS 66 77  BILITOT 1.2 1.1  PROT 7.0 8.3*  ALBUMIN 2.6* 3.2*   No results for input(s): LIPASE, AMYLASE in the last 72 hours. CBC: Recent Labs    01/16/20 0245 01/17/20 0430  WBC 13.5* 18.1*  NEUTROABS 8.7* 13.9*  HGB 15.4 17.1*  HCT 51.7 54.8*  MCV 67.2* 66.6*  PLT 868* 1,163*   Cardiac Enzymes: No results for input(s): CKTOTAL, CKMB, CKMBINDEX, TROPONINI in the last 72 hours. BNP: Invalid input(s): POCBNP D-Dimer: No results for input(s): DDIMER in the last 72 hours. Hemoglobin A1C: No results for input(s): HGBA1C in the last 72 hours.  Fasting Lipid Panel: No results for input(s): CHOL, HDL, LDLCALC, TRIG, CHOLHDL, LDLDIRECT in the last 72 hours. Thyroid Function Tests: No results for input(s): TSH, T4TOTAL, T3FREE, THYROIDAB in the last 72 hours.  Invalid input(s): FREET3 Anemia Panel: No results for input(s): VITAMINB12, FOLATE, FERRITIN, TIBC, IRON, RETICCTPCT in the last 72 hours.  No results found.   Echo: IMPRESSIONS  1. Left ventricular ejection fraction, by estimation, is <20%. The left  ventricle has severely decreased function. The left ventricle demonstrates  global hypokinesis. The left ventricular internal cavity size  was severely  dilated. There is mild left  ventricular hypertrophy. Left ventricular diastolic parameters were  normal.  2. Right ventricular systolic function is normal. The right ventricular  size is normal.  3. Left atrial size was moderately dilated.  4. Right atrial size was mildly dilated.  5. The mitral valve is normal in structure. Moderate to severe mitral  valve regurgitation.  6. Tricuspid valve regurgitation is mild to moderate.  7. The aortic valve is normal in structure. Aortic valve regurgitation is  mild.   TELEMETRY: SR with BBB and HR of 65 bpm.   ASSESSMENT AND PLAN:  Active Problems:   Acute on chronic systolic congestive heart failure (HCC)   Respiratory failure (HCC)   Malnutrition of moderate degree   Adult failure to thrive   Palliative care by specialist   DNR (do not resuscitate) discussion   Delirium    1. Tachyarrhythmia, unstable patient went into wide QRS complex tachycardia last night requiring synchronized defibrillation x3 and is now maintained on IV Lidocaine solely for rhythm management and IV potassium for hypokalemia treatment.   - continue Lidocaine drip at this time.   -Consider transitioning to oral mexiletine once stabilized.  -Agree with continuous telemetry monitoring.   -monitor CMP closely and correct electrolytes per protocol.   -Consider ACID placement once patient is alert and oriented.   2. Acute on chronic systolic CHF, uncompensated, fairly stable  -LVEF <20% per recent Echocardiogram. .  -continue IV diuresis and monitoring strict I&O's.   -continue to trend CMP, monitor kidney functioning closely and replace electrolytes per protocol   -Recommend restarting entresto and metoprolol when patient is alert and able to take oral medications safely in order to reduce the risk of aspiration.     3. Coronary Artery Disease s/p NSTEMI s/p PCI/DES to RCA(2019), fairly stable, high sensitivity troponins' borderline elevated  with downtrend, likely due to demand ischemia secondary to respiratory failure  -considered cardiac catheterization once hemodynamically stable and improvement of patient's mental status.   -continue aspirin, lipitor and metoprolol when patient is able to intake oral medications safely.   4. Paroxysmal atrial fibrillation, fairly stable, rate controlled, patient is in SR with BBB at this time  -Continue rate control with metoprolol and anticoagulation with eliquis.   -continue holding amiodarone at this time as it has the potential to exacerbate torsades.   5. Aortic stenosis s/p AVR [facility] tissue, 2020), stable  -Recent echo reveals mild AR.   -Continue conservative management.   6. Leukocytosis, fairly stable, WBC's are 18.1 on today   -agree with IV abx.   -monitoring vs q67mins.  7. Tobacco use  -Recommend tobacco cessation. Nicotine patch as needed.   8. AMS likely secondary to acute toxic metabolic encephalopathy, fairly stable  -Agree with current management.      Westly Hinnant, ACNPC-AG  01/17/2020 1:51 PM

## 2020-01-18 LAB — BASIC METABOLIC PANEL
Anion gap: 11 (ref 5–15)
BUN: 27 mg/dL — ABNORMAL HIGH (ref 8–23)
CO2: 23 mmol/L (ref 22–32)
Calcium: 9 mg/dL (ref 8.9–10.3)
Chloride: 107 mmol/L (ref 98–111)
Creatinine, Ser: 1.19 mg/dL (ref 0.61–1.24)
GFR, Estimated: >60 mL/min (ref >60)
Glucose, Bld: 233 mg/dL — ABNORMAL HIGH (ref 70–99)
Potassium: 3.9 mmol/L (ref 3.5–5.1)
Sodium: 141 mmol/L (ref 135–145)

## 2020-01-18 LAB — CBC WITH DIFFERENTIAL/PLATELET
Abs Immature Granulocytes: 0.22 10*3/uL — ABNORMAL HIGH (ref 0.00–0.07)
Basophils Absolute: 0.4 10*3/uL — ABNORMAL HIGH (ref 0.0–0.1)
Basophils Relative: 2 %
Eosinophils Absolute: 0.9 10*3/uL — ABNORMAL HIGH (ref 0.0–0.5)
Eosinophils Relative: 5 %
HCT: 54.7 % — ABNORMAL HIGH (ref 39.0–52.0)
Hemoglobin: 16.5 g/dL (ref 13.0–17.0)
Immature Granulocytes: 1 %
Lymphocytes Relative: 18 %
Lymphs Abs: 3.5 10*3/uL (ref 0.7–4.0)
MCH: 20.5 pg — ABNORMAL LOW (ref 26.0–34.0)
MCHC: 30.2 g/dL (ref 30.0–36.0)
MCV: 68.1 fL — ABNORMAL LOW (ref 80.0–100.0)
Monocytes Absolute: 1.5 10*3/uL — ABNORMAL HIGH (ref 0.1–1.0)
Monocytes Relative: 8 %
Neutro Abs: 13 10*3/uL — ABNORMAL HIGH (ref 1.7–7.7)
Neutrophils Relative %: 66 %
Platelets: 1211 10*3/uL (ref 150–400)
RBC: 8.03 MIL/uL — ABNORMAL HIGH (ref 4.22–5.81)
RDW: 25.5 % — ABNORMAL HIGH (ref 11.5–15.5)
Smear Review: INCREASED
WBC: 19.4 10*3/uL — ABNORMAL HIGH (ref 4.0–10.5)
nRBC: 0 % (ref 0.0–0.2)

## 2020-01-18 LAB — GLUCOSE, CAPILLARY
Glucose-Capillary: 254 mg/dL — ABNORMAL HIGH (ref 70–99)
Glucose-Capillary: 250 mg/dL — ABNORMAL HIGH (ref 70–99)
Glucose-Capillary: 113 mg/dL — ABNORMAL HIGH (ref 70–99)
Glucose-Capillary: 172 mg/dL — ABNORMAL HIGH (ref 70–99)

## 2020-01-18 LAB — PHOSPHORUS: Phosphorus: 3.6 mg/dL (ref 2.5–4.6)

## 2020-01-18 LAB — MAGNESIUM: Magnesium: 2.1 mg/dL (ref 1.7–2.4)

## 2020-01-18 NOTE — Progress Notes (Signed)
CRITICAL CARE NOTE  [name] presenting to the ED on 01/06/2020 in respiratory distress, complaining of chest pain and shortness of breath. He also reported orthopnea and increased lower extremity swelling. History was limited due to acuity of condition and respiratory status. Patient was placed on biPAP, but progressively worsened and was emergently intubated. Patient is a current 0.5ppd smoker and uses marijuana. He has a severe metabolic acidosis. Patient has a past medical history significant for HTN, T2DM, HLD, GERD, CAD (s/p PCI), AVR with bioprosthetic valve, systolic CHF with EF of 83%, and paroxysmal A. Fib, currently anticoagulated on Eliquis. EKG upon arrival to ED revealed sinus rhythm at a rate of 99bpm with RBBB and PVCs with ST elevation in II, III, avF, V4-V6. Cardiologist on call stated that STEMI criteria were not met and advised to manage CHF. Patient received IV diltiazem and reported improvement of symptoms. Labs were notable for high sensitivity troponin 71->91->89. BNP 1046, creatinine 1.26, BUN 21, potassium 5.4. CXR showed improved pulmonary edema since prior study with chronic pulmonary venous hypertension. Notably, patient was admitted to [facility] ED 12/04/2019 for acute respiratory failure and acute on chronic systolic CHF in the setting of COVID pneumonia.   SIGNIFICANT EVENTS 11/2 - severe cardiogenic shock, remains on vent 11/3 - severe respiratory failure, severe systolic CHF 43/7 - patient remains critically ill 11/5 - patient remains critically ill 11/6 - off pressors, extubated to biPAP 11/7 - remains extubated, febrile, MSSA+ tracheal culture - started ancef  11/8 - remains extubated, on 2L by nasal cannula. Afebrile today with white count of 21.3K, up from 15.7K yesterday. Still encephalopathic and drowsy. On and off Precedex. Went into narrow complex SVT, then progressed to wide complex tachycardia was shocked and given a loading dose of amiodarone. Will remain on  amiodarone gtt. Cardiology at bedside.   11/9 - remains extubated, on 2L by nasal cannula. Afebrile today with white count of 19.2K, down since yesterday. BP soft overnight, was started on Neo-synephrine infusion. More alert than yesterday, but only oriented to self. Failed Yale 3oz swallow study early this morning, started coughing at the end of the study. Will need to remain NPO until cleared by SLP. Patient has been educated on this, yet continues to incessantly ask for water.   11/10- patient clinically improved. He is alert communicative and in no distress. He ate entire meal without issues. Still has confusion intermittently during interview. Frequent PVCs on telemetry.  Will initiate OT/PT today for downgrade planning. 11/11- patient minimally symptomatic, he again had episodes of Torsades today s/p 120Jx1 mag 2g.  Poor prognosis overall with advanced CHF.  11/12- Dicussed case with cardiology. Antiarrythmic agents being modified. Will also call consult for Electrophysiology  11/13-  Patient with no arrythmia overnight. He remains on precedex and neosynephrine.  I spoke with cardiology Dr Rayann Heman regarding EP evaluation- he recommended to continue with current medical therapy and have cardiologist call to EP on Monday to review options.  He has 1:1 sitter and is resting in bed comfortably.  VITALS BP 130/87   Pulse 94   Temp 98 F (36.7 C) (Axillary)   Resp (!) 24   Ht _0  (1.702 m)   Wt 69.4 kg   SpO2 98%   BMI 23.96 kg/m    I/O last 3 completed shifts: In: 2900.4 [I.V.:2502; IV Piggyback:398.4] Out: 1700 [Urine:1700] No intake/output data recorded.  SpO2: 98 % O2 Flow Rate (L/min): 2 L/min FiO2 (%): 32 %  Estimated body mass  index is 23.96 kg/m as calculated from the following:   Height as of this encounter: _0  (1.702 m).   Weight as of this encounter: 69.4 kg.   REVIEW OF SYSTEMS    ROS - 10 point ROS uanble to obtain due to sedation    PHYSICAL  EXAMINATION  General: Age appropriate sedated  Head: Normocephalic, atraumatic Eyes: Pupils equal, round, reactive to light.  No scleral icterus.  Mouth: Moist mucosal membrane Neck: Supple, no JVD Pulmonary: Normal effort of breathing on supplemental oxygen, clear to auscultation bilaterally Cardiovascular: patient with recurrent arrythmias but no MRG on exam  Gastrointestinal: Soft, nontender, non-distended.  Positive bowel sounds.   Musculoskeletal: No swelling, clubbing, or edema Neurological:sedated GCS 5 Skin: Intact, warm, dry  MEDICATIONS: I have reviewed all medications and confirmed regimen as documented Scheduled Meds: . allopurinol  300 mg Oral Daily  . apixaban  5 mg Oral BID  . aspirin EC  81 mg Oral Daily  . atorvastatin  40 mg Oral QHS  . chlorhexidine  15 mL Mouth Rinse BID  . Chlorhexidine Gluconate Cloth  6 each Topical Daily  . feeding supplement  237 mL Oral BID BM  . insulin aspart  0-9 Units Subcutaneous TID AC & HS  . mouth rinse  15 mL Mouth Rinse q12n4p  . metoprolol succinate  25 mg Oral Daily  . polyethylene glycol  17 g Oral Daily  . sacubitril-valsartan  1 tablet Oral BID  . senna-docusate  1 tablet Oral BID  . sodium chloride flush  10-40 mL Intracatheter Q12H  . sodium chloride flush  3 mL Intravenous Q12H   Continuous Infusions: . sodium chloride 10 mL/hr at 01/12/20 1915  . sodium chloride 250 mL (01/12/20 1919)  . dexmedetomidine (PRECEDEX) IV infusion 0.8 mcg/kg/hr (01/18/20 0825)  . famotidine (PEPCID) IV 20 mg (01/18/20 1039)  . lidocaine 4 mg/min (01/18/20 1041)  . phenylephrine (NEO-SYNEPHRINE) Adult infusion 80 mcg/min (01/18/20 0543)   PRN Meds:.sodium chloride, acetaminophen, albuterol, diazepam, haloperidol lactate, metoprolol tartrate, ondansetron (ZOFRAN) IV, oxyCODONE, polyethylene glycol, sodium chloride flush, sodium chloride flush   CULTURE RESULTS   Recent Results (from the past 240 hour(s))  Culture, respiratory  (non-expectorated)     Status: None   Collection Time: 01/09/20  2:45 AM   Specimen: Tracheal Aspirate; Respiratory  Result Value Ref Range Status   Specimen Description   Final    TRACHEAL ASPIRATE Performed at [facility], [address] [place], [place] [address]    Special Requests   Final    NONE Performed at [facility], [address] [place], [place] [address]    Gram Stain   Final    MODERATE WBC PRESENT, PREDOMINANTLY PMN RARE GRAM POSITIVE COCCI Performed at [facility], [place] [address] [place], [place] [address]    Culture FEW STAPHYLOCOCCUS AUREUS  Final   Report Status 01/12/2020 FINAL  Final   Organism ID, Bacteria STAPHYLOCOCCUS AUREUS  Final      Susceptibility   Staphylococcus aureus - MIC*    CIPROFLOXACIN <=0.5 SENSITIVE Sensitive     ERYTHROMYCIN >=8 RESISTANT Resistant     GENTAMICIN <=0.5 SENSITIVE Sensitive     OXACILLIN <=0.25 SENSITIVE Sensitive     TETRACYCLINE <=1 SENSITIVE Sensitive     VANCOMYCIN 1 SENSITIVE Sensitive     TRIMETH/SULFA <=10 SENSITIVE Sensitive     CLINDAMYCIN RESISTANT Resistant     RIFAMPIN <=0.5 SENSITIVE Sensitive     Inducible Clindamycin POSITIVE  Resistant     * FEW STAPHYLOCOCCUS AUREUS       Intake/Output Summary (Last 24 hours) at 01/18/2020 1305 Last data filed at 01/18/2020 0543 Gross per 24 hour  Intake 1560.38 ml  Output 600 ml  Net 960.38 ml     LABS  CBC Latest Ref Rng & Units 01/18/2020 01/17/2020 01/16/2020  WBC 4.0 - 10.5 K/uL 19.4(H) 18.1(H) 13.5(H)  Hemoglobin 13.0 - 17.0 g/dL 16.5 17.1(H) 15.4  Hematocrit 39 - 52 % 54.7(H) 54.8(H) 51.7  Platelets 150 - 400 K/uL 1,211(HH) 1,163(HH) 868(H)   BMP Latest Ref Rng & Units 01/18/2020 01/17/2020 01/16/2020  Glucose 70 - 99 mg/dL 233(H) 248(H) 165(H)  BUN 8 - 23 mg/dL 27(H) 22 24(H)  Creatinine 0.61 - 1.24 mg/dL 1.19 1.29(H) 0.92  Sodium 135 - 145 mmol/L 141 142 138  Potassium 3.5 - 5.1 mmol/L 3.9 3.4(L) 3.4(L)  Chloride  98 - 111 mmol/L 107 105 101  CO2 22 - 32 mmol/L 23 20(L) 23  Calcium 8.9 - 10.3 mg/dL 9.0 9.0 8.7(L)      IMAGING    No results found.   Nutrition Status: Nutrition Problem: Moderate Malnutrition Etiology: chronic illness (CHF) Signs/Symptoms: moderate fat depletion, moderate muscle depletion Interventions: Tube feeding, MVI   Indwelling Urinary Catheter continued, requirement due to   Reason to continue Indwelling Urinary Catheter strict Intake/Output monitoring for hemodynamic instability   Central Line/ continued, requirement due to  Reason to continue [employer] Monitoring of central venous pressure or other hemodynamic parameters and poor IV access     ASSESSMENT AND PLAN SYNOPSIS [name] with severe respiratory failure due to metabolic acidosis with severe systolic CHF exacerbation, previous history of COVID-19 in the setting of drug abuse, undergoing therapy for HCAP, complicated by tachyarrhythmia and progressive heart failure.  ACUTE Hypoxic and Hypercapnic Respiratory Failure -improved  - possible related to emphysema and COPD with active smoking of tobacco and drugs.  - Continue bronchodilator therapy  ACUTE on chronic SYSTOLIC CARDIAC FAILURE- EF 20% - Oxygen as needed - Lasix as tolerated - cardiology on case appreciate input  NEUROLOGY - Acute toxic metabolic encephalopathy - Start Valium 2.48m q6 - Continue Precedex - Continue Thiamine infusion  Regular wide complex tachyarrythmia  - SVT with aberarnt conduction ? -  - S/p electrical cardioversion x several  - Use pressors as needed to keep MAP >55 - Continue amiodarone -400 bID PO >>Amio gtt>>>lidocaine gtt>>consider mixeltine per cardiology  -cardiology on case appreciate  -EP connsult?      ID - therapy for CAP - Tracheal culture positive for MSSA - Continue IV abx as prescibed  GI - GI PROPHYLAXIS as indicated   DIET - NPO - Failed Yale 3oz swallow study today - Pending SLP  evaluation - Constipation protocol as indicated  ENDO - Will use ICU hypoglycemic\Hyperglycemia protocol if indicated  ELECTROLYTES - Follow labs as needed - Replace as needed - Pharmacy consultation and following   DVT/GI PRX ordered and assessed TRANSFUSIONS AS NEEDED MONITOR FSBS I Assessed the need for Labs I Assessed the need for Foley I Assessed the need for Central Venous Line Family Discussion when available I Assessed the need for Mobilization I made an Assessment of medications to be adjusted accordingly Safety Risk assessment completed   CASE DISCUSSED IN MULTIDISCIPLINARY ROUNDS WITH ICU TEAM  Critical Care Time devoted to patient care services described in this note is 33 minutes.   Overall, patient is critically ill, prognosis is guarded.  Patient with Multiorgan failure and at high risk for cardiac arrest and death.   I would recommend DNR/DNI status Recommend Palliative Care consultation   Ottie Glazier, M.D.  Pulmonary & [place]

## 2020-01-18 NOTE — Progress Notes (Signed)
PHARMACY CONSULT NOTE - FOLLOW UP  Pharmacy Consult for Electrolyte Monitoring and Replacement   Recent Labs: Potassium (mmol/L)  Date Value  01/18/2020 3.9   Magnesium (mg/dL)  Date Value  55/73/2202 2.1   Calcium (mg/dL)  Date Value  54/27/0623 9.0   Albumin (g/dL)  Date Value  76/28/3151 3.2 (L)   Phosphorus (mg/dL)  Date Value  76/16/0737 3.6   Sodium (mmol/L)  Date Value  01/18/2020 141   Assessment: 69 year old male transferred to ICU for acute toxic metabolic encephalopathy. Patient now more alert and awake. Overnight 11/10, patient with two runs pulseless VT, with second episode requiring CPR and defibrillation. Converted back into baseline bundle branch rhythm with rate in 50's-60's.  Pt received magnesium 2 gm during episode. Similar episodes 11/11 requiring defibrillation. Pharmacy to manage electrolytes. Dysphagia 1 diet  Goal of Therapy:  Electrolytes WNL  Plan:  No replacement needed at this time. Will recheck electrolytes with morning labs.  Angelique Blonder ,PharmD Clinical Pharmacist 01/18/2020 1:15 PM

## 2020-01-18 NOTE — Progress Notes (Signed)
[facility] Cardiology    SUBJECTIVE: AMS/Confusion not able to answer any questions   Vitals:   01/18/20 0300 01/18/20 0400 01/18/20 0442 01/18/20 1100  BP: 109/74 (!) 154/110  130/87  Pulse:  94    Resp: (!) 24 (!) 24    Temp:  98 F (36.7 C)    TempSrc:  Axillary    SpO2:  98%    Weight:   69.4 kg   Height:         Intake/Output Summary (Last 24 hours) at 01/18/2020 1243 Last data filed at 01/18/2020 0543 Gross per 24 hour  Intake 1560.38 ml  Output 600 ml  Net 960.38 ml      PHYSICAL EXAM  General: Well developed, well nourished, in no acute distress HEENT:  Normocephalic and atramatic Neck:  No JVD.  Lungs: Clear bilaterally to auscultation and percussion. Heart: HRRR . Normal S1 and S2 without gallops or murmurs.  Abdomen: Bowel sounds are positive, abdomen soft and non-tender  Msk:  Back normal, normal gait. Normal strength and tone for age. Extremities: No clubbing, cyanosis or edema.   Neuro: Alert and oriented X 3. Psych:  Good affect, responds appropriately   LABS: Basic Metabolic Panel: Recent Labs    01/17/20 0430 01/18/20 0443  NA 142 141  K 3.4* 3.9  CL 105 107  CO2 20* 23  GLUCOSE 248* 233*  BUN 22 27*  CREATININE 1.29* 1.19  CALCIUM 9.0 9.0  MG 2.2 2.1  PHOS 3.0 3.6   Liver Function Tests: Recent Labs    01/16/20 1900  AST 29  ALT 13  ALKPHOS 77  BILITOT 1.1  PROT 8.3*  ALBUMIN 3.2*   No results for input(s): LIPASE, AMYLASE in the last 72 hours. CBC: Recent Labs    01/17/20 0430 01/18/20 0443  WBC 18.1* 19.4*  NEUTROABS 13.9* 13.0*  HGB 17.1* 16.5  HCT 54.8* 54.7*  MCV 66.6* 68.1*  PLT 1,163* 1,211*   Cardiac Enzymes: No results for input(s): CKTOTAL, CKMB, CKMBINDEX, TROPONINI in the last 72 hours. BNP: Invalid input(s): POCBNP D-Dimer: No results for input(s): DDIMER in the last 72 hours. Hemoglobin A1C: No results for input(s): HGBA1C in the last 72 hours. Fasting Lipid Panel: No results for input(s): CHOL,  HDL, LDLCALC, TRIG, CHOLHDL, LDLDIRECT in the last 72 hours. Thyroid Function Tests: No results for input(s): TSH, T4TOTAL, T3FREE, THYROIDAB in the last 72 hours.  Invalid input(s): FREET3 Anemia Panel: No results for input(s): VITAMINB12, FOLATE, FERRITIN, TIBC, IRON, RETICCTPCT in the last 72 hours.  No results found.     TELEMETRY: NSR 80 nsstw:  ASSESSMENT AND PLAN:  Active Problems:   Acute on chronic systolic congestive heart failure (HCC)   Respiratory failure (HCC)   Malnutrition of moderate degree   Adult failure to thrive   Palliative care by specialist   DNR (do not resuscitate) discussion   Delirium  Cardiomyopathy  Plan VT/Tachycardia improved off of Amiodarone Wean Lidocaine for tachycardia/ consider mexelitine Continiue medical theray for altered mental status Agree with treatment for A/CHF Pulmonary support with supplimental 02 Poor anticougulation candidate    Alwyn Pea, MD 01/18/2020 12:43 PM

## 2020-01-19 ENCOUNTER — Inpatient Hospital Stay: Payer: Medicare HMO

## 2020-01-19 ENCOUNTER — Encounter: Payer: Self-pay | Admitting: Internal Medicine

## 2020-01-19 DIAGNOSIS — J9600 Acute respiratory failure, unspecified whether with hypoxia or hypercapnia: Secondary | ICD-10-CM | POA: Diagnosis not present

## 2020-01-19 LAB — BLOOD GAS, ARTERIAL
Acid-Base Excess: 1.1 mmol/L (ref 0.0–2.0)
Acid-base deficit: 3.2 mmol/L — ABNORMAL HIGH (ref 0.0–2.0)
Bicarbonate: 18.2 mmol/L — ABNORMAL LOW (ref 20.0–28.0)
Bicarbonate: 26.6 mmol/L (ref 20.0–28.0)
FIO2: 0.28
FIO2: 0.5
MECHVT: 450 mL
O2 Saturation: 97.3 %
O2 Saturation: 97.7 %
PEEP: 5 cmH2O
Patient temperature: 37
Patient temperature: 37
RATE: 20 resp/min
pCO2 arterial: 25 mmHg — ABNORMAL LOW (ref 32.0–48.0)
pCO2 arterial: 44 mmHg (ref 32.0–48.0)
pH, Arterial: 7.47 — ABNORMAL HIGH (ref 7.350–7.450)
pH, Arterial: 7.39 (ref 7.350–7.450)
pO2, Arterial: 88 mmHg (ref 83.0–108.0)
pO2, Arterial: 100 mmHg (ref 83.0–108.0)

## 2020-01-19 LAB — GLUCOSE, CAPILLARY
Glucose-Capillary: 130 mg/dL — ABNORMAL HIGH (ref 70–99)
Glucose-Capillary: 139 mg/dL — ABNORMAL HIGH (ref 70–99)
Glucose-Capillary: 145 mg/dL — ABNORMAL HIGH (ref 70–99)
Glucose-Capillary: 165 mg/dL — ABNORMAL HIGH (ref 70–99)
Glucose-Capillary: 180 mg/dL — ABNORMAL HIGH (ref 70–99)
Glucose-Capillary: 189 mg/dL — ABNORMAL HIGH (ref 70–99)
Glucose-Capillary: 220 mg/dL — ABNORMAL HIGH (ref 70–99)
Glucose-Capillary: 225 mg/dL — ABNORMAL HIGH (ref 70–99)

## 2020-01-19 LAB — BASIC METABOLIC PANEL
Anion gap: 11 (ref 5–15)
Anion gap: 13 (ref 5–15)
Anion gap: 11 (ref 5–15)
BUN: 21 mg/dL (ref 8–23)
BUN: 23 mg/dL (ref 8–23)
BUN: 24 mg/dL — ABNORMAL HIGH (ref 8–23)
CO2: 17 mmol/L — ABNORMAL LOW (ref 22–32)
CO2: 17 mmol/L — ABNORMAL LOW (ref 22–32)
CO2: 25 mmol/L (ref 22–32)
Calcium: 7.7 mg/dL — ABNORMAL LOW (ref 8.9–10.3)
Calcium: 8.8 mg/dL — ABNORMAL LOW (ref 8.9–10.3)
Calcium: 8.8 mg/dL — ABNORMAL LOW (ref 8.9–10.3)
Chloride: 96 mmol/L — ABNORMAL LOW (ref 98–111)
Chloride: 106 mmol/L (ref 98–111)
Chloride: 106 mmol/L (ref 98–111)
Creatinine, Ser: 1 mg/dL (ref 0.61–1.24)
Creatinine, Ser: 1.08 mg/dL (ref 0.61–1.24)
Creatinine, Ser: 1.18 mg/dL (ref 0.61–1.24)
GFR, Estimated: >60 mL/min (ref >60)
GFR, Estimated: >60 mL/min (ref >60)
GFR, Estimated: >60 mL/min (ref >60)
Glucose, Bld: 696 mg/dL (ref 70–99)
Glucose, Bld: 219 mg/dL — ABNORMAL HIGH (ref 70–99)
Glucose, Bld: 195 mg/dL — ABNORMAL HIGH (ref 70–99)
Potassium: 3.9 mmol/L (ref 3.5–5.1)
Potassium: 6.7 mmol/L (ref 3.5–5.1)
Potassium: 4.4 mmol/L (ref 3.5–5.1)
Sodium: 124 mmol/L — ABNORMAL LOW (ref 135–145)
Sodium: 136 mmol/L (ref 135–145)
Sodium: 142 mmol/L (ref 135–145)

## 2020-01-19 LAB — CBC WITH DIFFERENTIAL/PLATELET
Abs Immature Granulocytes: 0.23 10*3/uL — ABNORMAL HIGH (ref 0.00–0.07)
Basophils Absolute: 0.2 10*3/uL — ABNORMAL HIGH (ref 0.0–0.1)
Basophils Relative: 2 %
Eosinophils Absolute: 0.7 10*3/uL — ABNORMAL HIGH (ref 0.0–0.5)
Eosinophils Relative: 5 %
HCT: 49.3 % (ref 39.0–52.0)
Hemoglobin: 14.9 g/dL (ref 13.0–17.0)
Immature Granulocytes: 2 %
Lymphocytes Relative: 16 %
Lymphs Abs: 2.3 10*3/uL (ref 0.7–4.0)
MCH: 20.3 pg — ABNORMAL LOW (ref 26.0–34.0)
MCHC: 30.2 g/dL (ref 30.0–36.0)
MCV: 67.3 fL — ABNORMAL LOW (ref 80.0–100.0)
Monocytes Absolute: 1.2 10*3/uL — ABNORMAL HIGH (ref 0.1–1.0)
Monocytes Relative: 8 %
Neutro Abs: 10.2 10*3/uL — ABNORMAL HIGH (ref 1.7–7.7)
Neutrophils Relative %: 67 %
Platelets: 1143 10*3/uL (ref 150–400)
RBC: 7.33 MIL/uL — ABNORMAL HIGH (ref 4.22–5.81)
RDW: 25.7 % — ABNORMAL HIGH (ref 11.5–15.5)
Smear Review: INCREASED
WBC: 14.8 10*3/uL — ABNORMAL HIGH (ref 4.0–10.5)
nRBC: 0.1 % (ref 0.0–0.2)

## 2020-01-19 LAB — LACTIC ACID, PLASMA
Lactic Acid, Venous: 1.9 mmol/L (ref 0.5–1.9)
Lactic Acid, Venous: 2.9 mmol/L (ref 0.5–1.9)

## 2020-01-19 LAB — PHOSPHORUS
Phosphorus: 2.4 mg/dL — ABNORMAL LOW (ref 2.5–4.6)
Phosphorus: 3.7 mg/dL (ref 2.5–4.6)

## 2020-01-19 LAB — MAGNESIUM
Magnesium: 1.6 mg/dL — ABNORMAL LOW (ref 1.7–2.4)
Magnesium: 1.9 mg/dL (ref 1.7–2.4)
Magnesium: 1.8 mg/dL (ref 1.7–2.4)

## 2020-01-19 LAB — TROPONIN I (HIGH SENSITIVITY): Troponin I (High Sensitivity): 234 ng/L (ref <18)

## 2020-01-19 LAB — TRIGLYCERIDES: Triglycerides: 55 mg/dL (ref <150)

## 2020-01-19 MED ORDER — SODIUM BICARBONATE 8.4 % IV SOLN
50.0000 meq | Freq: Once | INTRAVENOUS | Status: AC
  Administered 2020-01-19: 50 meq via INTRAVENOUS

## 2020-01-19 MED ORDER — ETOMIDATE 2 MG/ML IV SOLN
INTRAVENOUS | Status: AC
  Administered 2020-01-19: 20 mg via INTRAVENOUS
  Filled 2020-01-19: qty 10

## 2020-01-19 MED ORDER — INSULIN ASPART 100 UNIT/ML ~~LOC~~ SOLN
0.0000 [IU] | SUBCUTANEOUS | Status: DC
  Administered 2020-01-20 (×2): 1 [IU] via SUBCUTANEOUS
  Administered 2020-01-20: 3 [IU] via SUBCUTANEOUS
  Administered 2020-01-20: 2 [IU] via SUBCUTANEOUS
  Administered 2020-01-20: 1 [IU] via SUBCUTANEOUS
  Administered 2020-01-21: 3 [IU] via SUBCUTANEOUS
  Administered 2020-01-21: 2 [IU] via SUBCUTANEOUS
  Administered 2020-01-21: 3 [IU] via SUBCUTANEOUS
  Administered 2020-01-21 (×2): 2 [IU] via SUBCUTANEOUS
  Administered 2020-01-21: 3 [IU] via SUBCUTANEOUS
  Administered 2020-01-22 (×6): 2 [IU] via SUBCUTANEOUS
  Administered 2020-01-23: 3 [IU] via SUBCUTANEOUS
  Administered 2020-01-23 (×5): 2 [IU] via SUBCUTANEOUS
  Administered 2020-01-24: 1 [IU] via SUBCUTANEOUS
  Administered 2020-01-24: 2 [IU] via SUBCUTANEOUS
  Administered 2020-01-24: 1 [IU] via SUBCUTANEOUS
  Administered 2020-01-24: 2 [IU] via SUBCUTANEOUS
  Administered 2020-01-24: 1 [IU] via SUBCUTANEOUS
  Administered 2020-01-25: 3 [IU] via SUBCUTANEOUS
  Administered 2020-01-25: 1 [IU] via SUBCUTANEOUS
  Administered 2020-01-25: 2 [IU] via SUBCUTANEOUS
  Administered 2020-01-25: 3 [IU] via SUBCUTANEOUS
  Administered 2020-01-25: 1 [IU] via SUBCUTANEOUS
  Administered 2020-01-26: 2 [IU] via SUBCUTANEOUS
  Administered 2020-01-26 (×3): 1 [IU] via SUBCUTANEOUS
  Administered 2020-01-27: 2 [IU] via SUBCUTANEOUS
  Administered 2020-01-27 – 2020-01-28 (×6): 1 [IU] via SUBCUTANEOUS
  Administered 2020-01-28: 2 [IU] via SUBCUTANEOUS
  Administered 2020-01-28 (×2): 1 [IU] via SUBCUTANEOUS
  Administered 2020-01-28: 2 [IU] via SUBCUTANEOUS
  Administered 2020-01-28: 1 [IU] via SUBCUTANEOUS
  Administered 2020-01-29: 2 [IU] via SUBCUTANEOUS
  Administered 2020-01-29: 1 [IU] via SUBCUTANEOUS
  Administered 2020-01-29: 3 [IU] via SUBCUTANEOUS
  Administered 2020-01-29: 1 [IU] via SUBCUTANEOUS
  Administered 2020-01-30: 2 [IU] via SUBCUTANEOUS
  Administered 2020-01-30 – 2020-01-31 (×6): 1 [IU] via SUBCUTANEOUS
  Administered 2020-02-01: 2 [IU] via SUBCUTANEOUS
  Administered 2020-02-01 – 2020-02-02 (×5): 1 [IU] via SUBCUTANEOUS
  Administered 2020-02-02: 2 [IU] via SUBCUTANEOUS
  Administered 2020-02-03: 1 [IU] via SUBCUTANEOUS
  Administered 2020-02-03: 2 [IU] via SUBCUTANEOUS
  Administered 2020-02-03 – 2020-02-04 (×2): 1 [IU] via SUBCUTANEOUS
  Administered 2020-02-04 (×2): 2 [IU] via SUBCUTANEOUS
  Administered 2020-02-04 (×2): 1 [IU] via SUBCUTANEOUS
  Administered 2020-02-04: 2 [IU] via SUBCUTANEOUS
  Administered 2020-02-05 (×3): 1 [IU] via SUBCUTANEOUS
  Administered 2020-02-05: 2 [IU] via SUBCUTANEOUS
  Administered 2020-02-06 (×3): 1 [IU] via SUBCUTANEOUS
  Administered 2020-02-07: 2 [IU] via SUBCUTANEOUS
  Administered 2020-02-07: 1 [IU] via SUBCUTANEOUS
  Administered 2020-02-07: 2 [IU] via SUBCUTANEOUS
  Administered 2020-02-07 – 2020-02-08 (×2): 1 [IU] via SUBCUTANEOUS
  Administered 2020-02-08: 2 [IU] via SUBCUTANEOUS
  Administered 2020-02-08: 1 [IU] via SUBCUTANEOUS
  Administered 2020-02-09 (×2): 2 [IU] via SUBCUTANEOUS
  Administered 2020-02-09 (×3): 1 [IU] via SUBCUTANEOUS
  Administered 2020-02-10: 2 [IU] via SUBCUTANEOUS
  Administered 2020-02-10 (×4): 1 [IU] via SUBCUTANEOUS
  Administered 2020-02-11 (×2): 2 [IU] via SUBCUTANEOUS
  Administered 2020-02-11: 3 [IU] via SUBCUTANEOUS
  Administered 2020-02-11 – 2020-02-12 (×4): 1 [IU] via SUBCUTANEOUS
  Administered 2020-02-12 (×2): 2 [IU] via SUBCUTANEOUS
  Administered 2020-02-12: 1 [IU] via SUBCUTANEOUS
  Administered 2020-02-12 (×2): 2 [IU] via SUBCUTANEOUS
  Administered 2020-02-13 (×2): 3 [IU] via SUBCUTANEOUS
  Administered 2020-02-13 (×2): 1 [IU] via SUBCUTANEOUS
  Administered 2020-02-14 (×3): 2 [IU] via SUBCUTANEOUS
  Administered 2020-02-14: 1 [IU] via SUBCUTANEOUS
  Administered 2020-02-14: 2 [IU] via SUBCUTANEOUS
  Administered 2020-02-15 (×3): 1 [IU] via SUBCUTANEOUS
  Administered 2020-02-15: 3 [IU] via SUBCUTANEOUS
  Administered 2020-02-15 – 2020-02-16 (×3): 2 [IU] via SUBCUTANEOUS
  Administered 2020-02-17 (×2): 1 [IU] via SUBCUTANEOUS
  Administered 2020-02-18: 18:00:00 2 [IU] via SUBCUTANEOUS
  Administered 2020-02-18: 21:00:00 3 [IU] via SUBCUTANEOUS
  Administered 2020-02-18 (×2): 2 [IU] via SUBCUTANEOUS
  Administered 2020-02-19: 22:00:00 3 [IU] via SUBCUTANEOUS
  Administered 2020-02-19: 14:00:00 2 [IU] via SUBCUTANEOUS
  Administered 2020-02-19: 09:00:00 1 [IU] via SUBCUTANEOUS
  Administered 2020-02-20: 10:00:00 2 [IU] via SUBCUTANEOUS
  Filled 2020-01-19 (×135): qty 1

## 2020-01-19 MED ORDER — ROCURONIUM BROMIDE 50 MG/5ML IV SOLN
INTRAVENOUS | Status: AC
  Administered 2020-01-19: 50 mg via INTRAVENOUS
  Filled 2020-01-19: qty 1

## 2020-01-19 MED ORDER — MAGNESIUM SULFATE 2 GM/50ML IV SOLN
2.0000 g | Freq: Once | INTRAVENOUS | Status: AC
  Administered 2020-01-19: 2 g via INTRAVENOUS
  Filled 2020-01-19: qty 50

## 2020-01-19 MED ORDER — FUROSEMIDE 10 MG/ML IJ SOLN
40.0000 mg | Freq: Once | INTRAMUSCULAR | Status: AC
  Administered 2020-01-19: 40 mg via INTRAVENOUS

## 2020-01-19 MED ORDER — ROCURONIUM BROMIDE 50 MG/5ML IV SOLN: 50.0000 mg | Freq: Once | INTRAVENOUS | Status: AC

## 2020-01-19 MED ORDER — DOCUSATE SODIUM 50 MG/5ML PO LIQD
100.0000 mg | Freq: Two times a day (BID) | ORAL | Status: DC
  Administered 2020-01-19 – 2020-01-20 (×2): 100 mg via ORAL
  Filled 2020-01-19 (×2): qty 10

## 2020-01-19 MED ORDER — DEXTROSE 50 % IV SOLN
1.0000 | Freq: Once | INTRAVENOUS | Status: AC
  Administered 2020-01-19: 50 mL via INTRAVENOUS
  Filled 2020-01-19: qty 50

## 2020-01-19 MED ORDER — PROPOFOL 1000 MG/100ML IV EMUL
0.0000 ug/kg/min | INTRAVENOUS | Status: DC
  Administered 2020-01-20: 5 ug/kg/min via INTRAVENOUS
  Administered 2020-01-21: 10 ug/kg/min via INTRAVENOUS
  Filled 2020-01-19 (×2): qty 100

## 2020-01-19 MED ORDER — POLYETHYLENE GLYCOL 3350 17 G PO PACK: 17.0000 g | PACK | Freq: Every day | ORAL | Status: DC

## 2020-01-19 MED ORDER — SODIUM BICARBONATE 8.4 % IV SOLN
INTRAVENOUS | Status: AC
  Filled 2020-01-19: qty 50

## 2020-01-19 MED ORDER — CHLORHEXIDINE GLUCONATE 0.12 % MT SOLN
OROMUCOSAL | Status: AC
  Filled 2020-01-19: qty 15

## 2020-01-19 MED ORDER — FENTANYL CITRATE (PF) 100 MCG/2ML IJ SOLN
100.0000 ug | Freq: Once | INTRAMUSCULAR | Status: AC
  Administered 2020-01-19: 100 ug via INTRAVENOUS

## 2020-01-19 MED ORDER — FENTANYL BOLUS VIA INFUSION
25.0000 ug | INTRAVENOUS | Status: DC | PRN
  Administered 2020-01-20 – 2020-01-21 (×3): 25 ug via INTRAVENOUS
  Administered 2020-01-24: 50 ug via INTRAVENOUS
  Filled 2020-01-19: qty 25

## 2020-01-19 MED ORDER — PATIROMER SORBITEX CALCIUM 8.4 G PO PACK
25.2000 g | PACK | Freq: Once | ORAL | Status: AC
  Administered 2020-01-19: 25.2 g via ORAL
  Filled 2020-01-19: qty 3

## 2020-01-19 MED ORDER — INSULIN ASPART 100 UNIT/ML ~~LOC~~ SOLN
10.0000 [IU] | Freq: Once | SUBCUTANEOUS | Status: AC
  Administered 2020-01-19: 10 [IU] via INTRAVENOUS
  Filled 2020-01-19: qty 1

## 2020-01-19 MED ORDER — ETOMIDATE 2 MG/ML IV SOLN: 20.0000 mg | Freq: Once | INTRAVENOUS | Status: AC

## 2020-01-19 MED ORDER — ORAL CARE MOUTH RINSE
15.0000 mL | OROMUCOSAL | Status: DC
  Administered 2020-01-19 – 2020-01-27 (×76): 15 mL via OROMUCOSAL

## 2020-01-19 MED ORDER — FENTANYL CITRATE (PF) 100 MCG/2ML IJ SOLN
INTRAMUSCULAR | Status: AC
  Filled 2020-01-19: qty 2

## 2020-01-19 MED ORDER — SODIUM BICARBONATE 8.4 % IV SOLN
50.0000 meq | Freq: Once | INTRAVENOUS | Status: AC
  Administered 2020-01-19: 50 meq via INTRAVENOUS
  Filled 2020-01-19: qty 50

## 2020-01-19 MED ORDER — FUROSEMIDE 10 MG/ML IJ SOLN
INTRAMUSCULAR | Status: AC
  Filled 2020-01-19: qty 4

## 2020-01-19 MED ORDER — MAGNESIUM SULFATE IN D5W 1-5 GM/100ML-% IV SOLN
1.0000 g | Freq: Once | INTRAVENOUS | Status: AC
  Administered 2020-01-19: 1 g via INTRAVENOUS
  Filled 2020-01-19: qty 100

## 2020-01-19 MED ORDER — FENTANYL 2500MCG IN NS 250ML (10MCG/ML) PREMIX INFUSION
25.0000 ug/h | INTRAVENOUS | Status: DC
  Administered 2020-01-19: 50 ug/h via INTRAVENOUS
  Administered 2020-01-20 – 2020-01-22 (×4): 100 ug/h via INTRAVENOUS
  Administered 2020-01-23 – 2020-01-24 (×3): 150 ug/h via INTRAVENOUS
  Filled 2020-01-19 (×9): qty 250

## 2020-01-19 MED ORDER — CHLORHEXIDINE GLUCONATE 0.12% ORAL RINSE (MEDLINE KIT)
15.0000 mL | Freq: Two times a day (BID) | OROMUCOSAL | Status: DC
  Administered 2020-01-19 – 2020-01-26 (×15): 15 mL via OROMUCOSAL

## 2020-01-19 MED ORDER — ALBUTEROL SULFATE (2.5 MG/3ML) 0.083% IN NEBU
2.5000 mg | INHALATION_SOLUTION | Freq: Once | RESPIRATORY_TRACT | Status: AC
  Administered 2020-01-19: 2.5 mg via RESPIRATORY_TRACT
  Filled 2020-01-19: qty 3

## 2020-01-19 MED ORDER — FENTANYL CITRATE (PF) 100 MCG/2ML IJ SOLN: 25.0000 ug | Freq: Once | INTRAMUSCULAR | Status: DC

## 2020-01-19 NOTE — Progress Notes (Signed)
Phone call made to pt's daughter [name] to update her regarding her father's respiratory distress and intubation this morning.  Unable to reach her.  Voice message was left for her to call back.     [name], AGACNP-BC [place] [facility] Pager: [phone]

## 2020-01-19 NOTE — Progress Notes (Addendum)
BRIEF CRITICAL CARE NOTE  Called to bedside by nursing due to concerns for increased work of breathing and irregular breathing pattern.  Pt noted to be diaphoretic, tachypneic with RR 20's-30's with accessory muscle use.  Noted to have bibasilar crackles.  O2 saturations 98-100% on 2L [place].  Telemetry with wide complex rhythm (not new for the pt), HR in the 90's of which pt is already on Lidocaine gtt.  Pt with known history of severe HFrEF and arrhthymias. Pt remains altered (now new) and previously on precedex which has been turned off.  Pupils equal round and reactive.  Orders placed for 40 mg Lasix, STAT CBC, BMP, Magnesium, Troponin, lactic acid, and EKG.  ABG (pH 7.47 / pCO2 25 / pO2 88 / Bicarb 18.2) with metabolic acidosis with compensatory respiratory alkalosis.  Orders placed for 2 amps Bicarb push.     04:40 Pt noted to progress to agonal respirations with minimal responsiveness. Pt continues to have a pulse.  Rescue breaths provided via Bag-mask-valve ventilation while preparing for intubation.  O2 sats remain 98-100% with BVM ventilation.     Will update pt's family once intubated and stabilized.       [name], AGACNP-BC [place] [facility] Pager: [phone]

## 2020-01-19 NOTE — Progress Notes (Signed)
At 1am, patient became diaphoretic and HR increased to the 80s with wide QRS. Patient would have intermittent periods where he would breathe fast in the 20s and 30s then go back to regular breathing ranging from 12 to 15 breaths per minute. ICU NP notified of this change. ICU NP ordered chest x-ray for the morning but no other orders.  Patient had no urine output in external catheter during shift. Bladder scanned patient at midnight and found 216 mL in bladder. Did another bladder scan at 3am and found 250 mL urine in bladder. ICU NP aware and suggested not in and out cath unless patient retained or more in the bladder.  At 4am, during my re-assessment, I noticed that the patient's breathing a see-saw pattern and was irregular. Patient would have brief periods of desaturation into the mid 80s. Placed him on [facility]. HR increased to the 120s intermittently with wide QRS. ICU NP notified and came to bedside. RT called to bedside. Charge RN called to bedside. Received new orders for ABG. See results review. Received new orders for one time doses of lasix and bicarb. See MAR. Breathing pattern did not improve and patient was bagged. ICU NP intubated patient and placed on ventilator. OG inserted. Foley inserted.

## 2020-01-19 NOTE — Progress Notes (Signed)
[facility] Cardiology    SUBJECTIVE: Intubated sedated   Vitals:   01/19/20 0700 01/19/20 0800 01/19/20 0900 01/19/20 1000  BP: 98/72 109/81 124/83 108/77  Pulse: (!) 57 (!) 59 67 74  Resp: 20 (!) 22 20 (!) 21  Temp: 99 F (37.2 C) 99.7 F (37.6 C) 99.7 F (37.6 C) 99.1 F (37.3 C)  TempSrc:  Rectal    SpO2: 98% 99% 99% 97%  Weight:      Height:         Intake/Output Summary (Last 24 hours) at 01/19/2020 1142 Last data filed at 01/19/2020 0558 Gross per 24 hour  Intake 2090.19 ml  Output 600 ml  Net 1490.19 ml      PHYSICAL EXAM  General: Well developed, well nourished, sedated HEENT:  Normocephalic and atramatic Neck:  No JVD.  Lungs: Clear bilaterally to auscultation and percussion. Heart: HRRR . Normal S1 and S2 without gallops or murmurs.  Abdomen: Bowel sounds are positive, abdomen soft and non-tender  Msk:  Back normal, normal gait. Normal strength and tone for age. Extremities: No clubbing, cyanosis or edema.   Neuro: Intubated sedated Psych: Intubated sedated   LABS: Basic Metabolic Panel: Recent Labs    01/18/20 0443 01/18/20 0443 01/19/20 0419 01/19/20 0554  NA 141   < > 124* 136  K 3.9   < > 3.9 6.7*  CL 107   < > 96* 106  CO2 23   < > 17* 17*  GLUCOSE 233*   < > 696* 219*  BUN 27*   < > 21 23  CREATININE 1.19   < > 1.00 1.08  CALCIUM 9.0   < > 7.7* 8.8*  MG 2.1  --  1.6*  --   PHOS 3.6  --  2.4*  --    < > = values in this interval not displayed.   Liver Function Tests: Recent Labs    01/16/20 1900  AST 29  ALT 13  ALKPHOS 77  BILITOT 1.1  PROT 8.3*  ALBUMIN 3.2*   No results for input(s): LIPASE, AMYLASE in the last 72 hours. CBC: Recent Labs    01/18/20 0443 01/19/20 0419  WBC 19.4* 14.8*  NEUTROABS 13.0* 10.2*  HGB 16.5 14.9  HCT 54.7* 49.3  MCV 68.1* 67.3*  PLT 1,211* 1,143*   Cardiac Enzymes: No results for input(s): CKTOTAL, CKMB, CKMBINDEX, TROPONINI in the last 72 hours. BNP: Invalid input(s):  POCBNP D-Dimer: No results for input(s): DDIMER in the last 72 hours. Hemoglobin A1C: No results for input(s): HGBA1C in the last 72 hours. Fasting Lipid Panel: Recent Labs    01/19/20 0419  TRIG 55   Thyroid Function Tests: No results for input(s): TSH, T4TOTAL, T3FREE, THYROIDAB in the last 72 hours.  Invalid input(s): FREET3 Anemia Panel: No results for input(s): VITAMINB12, FOLATE, FERRITIN, TIBC, IRON, RETICCTPCT in the last 72 hours.  DG Abd 1 View  Result Date: 01/19/2020 CLINICAL DATA:  ET tube and NG tube placement. EXAM: ABDOMEN - 1 VIEW COMPARISON:  None. FINDINGS: No NG tube identified over the lower chest/abdomen. Chest x-ray performed at the same time showed NG tube looped upon itself in the hypopharynx/proximal esophagus. Nonspecific bowel gas pattern. Radiodensity overlying the right kidney may be a renal stone. IMPRESSION: 1. NG tube not visualized in the lower chest or upper abdomen. 2. Nonspecific bowel gas pattern. 3. Probable right renal stone. Electronically Signed   By: Kennith Center M.D.   On: 01/19/2020 06:28   DG  Chest Port 1 View  Result Date: 01/19/2020 CLINICAL DATA:  Intubation. EXAM: PORTABLE CHEST 1 VIEW COMPARISON:  January 19, 2020 FINDINGS: Endotracheal tube in satisfactory position. Shock pads overlie the chest. Postsurgical changes from cardiothoracic surgery. The cardiac silhouette is stable enlarged. Streaky airspace opacities in bilateral lung bases. No evidence of pneumothorax. Known mixed density lesion in the right proximal humerus. Soft tissues are grossly normal. IMPRESSION: 1. Endotracheal tube in satisfactory position. 2. Streaky airspace opacities in bilateral lung bases may represent atelectasis or infectious airspace consolidation. Electronically Signed   By: Ted Mcalpine M.D.   On: 01/19/2020 10:27   DG Chest Port 1 View  Result Date: 01/19/2020 CLINICAL DATA:  OG tube placement. EXAM: PORTABLE CHEST 1 VIEW COMPARISON:  Earlier  same day FINDINGS: 0820 hours. Leftward patient rotation. The cardio pericardial silhouette is enlarged. NG tube/OG tube is positioned in the proximal stomach with proximal side port just above the expected location of the esophagogastric junction. IMPRESSION: NG tube tip projects over the proximal stomach. Electronically Signed   By: Kennith Center M.D.   On: 01/19/2020 09:21   DG Chest Port 1 View  Result Date: 01/19/2020 CLINICAL DATA:  Respiratory failure. EXAM: PORTABLE CHEST 1 VIEW COMPARISON:  01/11/2020 FINDINGS: Endotracheal tube tip is 10.9 cm above the base of the carina, positioned at the sternal notch. NG tube appears to be looped upon itself in the hypopharynx/upper esophagus with the tip above the superior margin of the film. Right PICC line tip overlies the mid SVC level. The cardio pericardial silhouette is enlarged. Interstitial markings are diffusely coarsened with chronic features. There is pulmonary vascular congestion without overt pulmonary edema. Telemetry leads overlie the chest. IMPRESSION: 1. Endotracheal tube tip is 10.9 cm above the base of the carina, positioned at the sternal notch. This could likely be advance 4-5 cm for more appropriate positioning. 2. NG tube appears to be looped upon itself in the hypopharynx/upper esophagus with the tip above the superior margin of the film. Repositioning recommended. 3. Cardiomegaly with chronic interstitial changes. These results will be called to the ordering clinician or representative by the Radiologist Assistant, and communication documented in the PACS or [employer]. Electronically Signed   By: Kennith Center M.D.   On: 01/19/2020 06:27     Echo severely depressed left ventricular function EF between 20 and 25% well-seated bioprosthetic aortic valve  TELEMETRY: Sinus rhythm at 90 bundle branch block  ASSESSMENT AND PLAN:  Active Problems:   Acute on chronic systolic congestive heart failure (HCC)   Respiratory failure  (HCC)   Malnutrition of moderate degree   Adult failure to thrive   Palliative care by specialist   DNR (do not resuscitate) discussion   Delirium    Plan Continue intensive care therapy and management Agree with respiratory support on the vent because of respiratory failure Continue aggressive antiarrhythmic therapywith lidocaine patient appeared to have had torsades and an amiodarone had to be discontinued Maintain adequate electrolytes to reduce the risk of further arrhythmias Continue blood pressure support with pressors as necessary Maintain aggressive medical therapy for heart failure I do not recommend any mechanical intervention for his heart failure at this point Mental status changes I think may be unrelated to his heart failure or his arrhythmias Prognosis remains poor Case was discussed with his daughter at length Would strongly consider palliative care If not clear to me that transferring to tertiary care facility would add any additional benefit to the patient at this point,  but I am not opposed to transfer him if the team chooses to do so

## 2020-01-19 NOTE — Progress Notes (Signed)
PHARMACY CONSULT NOTE - FOLLOW UP  Pharmacy Consult for Electrolyte Monitoring and Replacement   Recent Labs: Potassium (mmol/L)  Date Value  01/19/2020 6.7 (HH)   Magnesium (mg/dL)  Date Value  50/05/7046 1.6 (L)   Calcium (mg/dL)  Date Value  88/91/6945 8.8 (L)   Albumin (g/dL)  Date Value  03/88/8280 3.2 (L)   Phosphorus (mg/dL)  Date Value  03/49/1791 2.4 (L)   Sodium (mmol/L)  Date Value  01/19/2020 136   Assessment: 69 year old male transferred to ICU for acute toxic metabolic encephalopathy. Patient now more alert and awake. Overnight 11/10, patient with two runs pulseless VT, with second episode requiring CPR and defibrillation. Converted back into baseline bundle branch rhythm with rate in 50's-60's.  Pt received magnesium 2 gm during episode. Similar episodes 11/11 requiring defibrillation. Pharmacy to manage electrolytes. Dysphagia 1 diet 11/4- pt intubated. K jump to 6.7  Goal of Therapy:  Electrolytes WNL  Plan:  11/14 K 6.7 Mag 1.6  Phos 1.6  Scr 1.08  Na 136 Patient given lasix, insulin, sodium bicarb and veltassa. Pt on Entresto (last dose given 11/12 pm per [facility]). Given that patient is on lidocaine drip w/ hx of arrhthymias will replace Magnesium with 1 gram IV x 1 fo rnow and f/u electrolytes/Mag/Phos w/ BMP ordered for noon  Will recheck electrolytes with morning labs.  Angelique Blonder ,PharmD Clinical Pharmacist 01/19/2020 8:36 AM

## 2020-01-19 NOTE — Progress Notes (Signed)
advance ETT by 4 cm @ 28 cm

## 2020-01-19 NOTE — Progress Notes (Signed)
PHARMACY CONSULT NOTE - FOLLOW UP  Pharmacy Consult for Electrolyte Monitoring and Replacement   Recent Labs: Potassium (mmol/L)  Date Value  01/19/2020 4.4   Magnesium (mg/dL)  Date Value  16/12/9602 1.9   Calcium (mg/dL)  Date Value  54/11/8117 8.8 (L)   Albumin (g/dL)  Date Value  14/78/2956 3.2 (L)   Phosphorus (mg/dL)  Date Value  21/30/8657 3.7   Sodium (mmol/L)  Date Value  01/19/2020 142   Assessment: 69 year old male transferred to ICU for acute toxic metabolic encephalopathy. Patient now more alert and awake. Overnight 11/10, patient with two runs pulseless VT, with second episode requiring CPR and defibrillation. Converted back into baseline bundle branch rhythm with rate in 50's-60's.  Pt received magnesium 2 gm during episode. Similar episodes 11/11 requiring defibrillation. Pharmacy to manage electrolytes. Dysphagia 1 diet 11/4- pt intubated. K jump to 6.7  Goal of Therapy:  Electrolytes WNL  Plan:  11/14 K 6.7 Mag 1.6  Phos 1.6  Scr 1.08  Na 136 Patient given lasix, insulin, sodium bicarb and veltassa. Pt on Entresto (last dose given 11/12 pm per [facility]). Given that patient is on lidocaine drip w/ hx of arrhthymias will replace Magnesium with 1 gram IV x 1 fo rnow and f/u electrolytes/Mag/Phos w/ BMP ordered for noon  11/14 recheck @ 1316:  K 4.4  Mag 1.9  Phos 3.7  Scr 1.18  Na 142 No replacement at this time. Watch K on Entresto when takes PO. Will continue to follow- watch K/Mag,  While on Lidocane drip. Will recheck electrolytes with morning labs.  Angelique Blonder ,PharmD Clinical Pharmacist 01/19/2020 2:58 PM

## 2020-01-19 NOTE — Procedures (Signed)
Intubation Procedure Note  Travis Maxwell  [id number]  01-Sep-1950  Date:01/19/20  Time:4:55 AM   Provider Performing:Yamaira Spinner D Elvina Sidle    Procedure: Intubation (31500)  Indication(s) Respiratory Failure  Consent Unable to obtain consent due to emergent nature of procedure.   Anesthesia Etomidate, Fentanyl and Rocuronium   Time Out Verified patient identification, verified procedure, site/side was marked, verified correct patient position, special equipment/implants available, medications/allergies/relevant history reviewed, required imaging and test results available.   Sterile Technique Usual hand hygeine, masks, and gloves were used   Procedure Description Patient positioned in bed supine.  Sedation given as noted above.  Patient was intubated with endotracheal tube using Glidescope.  View was Grade 1 full glottis .  Number of attempts was 1.  Colorimetric CO2 detector was consistent with tracheal placement.   Complications/Tolerance None; patient tolerated the procedure well. Chest X-ray is ordered to verify placement.   EBL Minimal   Specimen(s) None   Size 7.5 ETT  Secured at 23 cm at the lip.    [name], AGACNP-BC [facility] Pager: [phone]

## 2020-01-19 NOTE — Progress Notes (Signed)
CRITICAL CARE NOTE  [name] presenting to the ED on 01/06/2020 in respiratory distress, complaining of chest pain and shortness of breath. He also reported orthopnea and increased lower extremity swelling. History was limited due to acuity of condition and respiratory status. Patient was placed on biPAP, but progressively worsened and was emergently intubated. Patient is a current 0.5ppd smoker and uses marijuana. He has a severe metabolic acidosis. Patient has a past medical history significant for HTN, T2DM, HLD, GERD, CAD (s/p PCI), AVR with bioprosthetic valve, systolic CHF with EF of 62%, and paroxysmal A. Fib, currently anticoagulated on Eliquis. EKG upon arrival to ED revealed sinus rhythm at a rate of 99bpm with RBBB and PVCs with ST elevation in II, III, avF, V4-V6. Cardiologist on call stated that STEMI criteria were not met and advised to manage CHF. Patient received IV diltiazem and reported improvement of symptoms. Labs were notable for high sensitivity troponin 71->91->89. BNP 1046, creatinine 1.26, BUN 21, potassium 5.4. CXR showed improved pulmonary edema since prior study with chronic pulmonary venous hypertension. Notably, patient was admitted to [facility] ED 12/04/2019 for acute respiratory failure and acute on chronic systolic CHF in the setting of COVID pneumonia.   SIGNIFICANT EVENTS 11/2 - severe cardiogenic shock, remains on vent 11/3 - severe respiratory failure, severe systolic CHF 95/2 - patient remains critically ill 11/5 - patient remains critically ill 11/6 - off pressors, extubated to biPAP 11/7 - remains extubated, febrile, MSSA+ tracheal culture - started ancef  11/8 - remains extubated, on 2L by nasal cannula. Afebrile today with white count of 21.3K, up from 15.7K yesterday. Still encephalopathic and drowsy. On and off Precedex. Went into narrow complex SVT, then progressed to wide complex tachycardia was shocked and given a loading dose of amiodarone. Will remain on  amiodarone gtt. Cardiology at bedside.   11/9 - remains extubated, on 2L by nasal cannula. Afebrile today with white count of 19.2K, down since yesterday. BP soft overnight, was started on Neo-synephrine infusion. More alert than yesterday, but only oriented to self. Failed Yale 3oz swallow study early this morning, started coughing at the end of the study. Will need to remain NPO until cleared by SLP. Patient has been educated on this, yet continues to incessantly ask for water.   11/10- patient clinically improved. He is alert communicative and in no distress. He ate entire meal without issues. Still has confusion intermittently during interview. Frequent PVCs on telemetry.  Will initiate OT/PT today for downgrade planning. 11/11- patient minimally symptomatic, he again had episodes of Torsades today s/p 120Jx1 mag 2g.  Poor prognosis overall with advanced CHF.  11/12- Dicussed case with cardiology. Antiarrythmic agents being modified. Will also call consult for Electrophysiology  11/13-  Patient with no arrythmia overnight. He remains on precedex and neosynephrine.  I spoke with cardiology Dr Rayann Heman regarding EP evaluation- he recommended to continue with current medical therapy and have cardiologist call to EP on Monday to review options.  He has 1:1 sitter and is resting in bed comfortably. Spoke with Velva Harman POA daughter to update on poor prognosis worsening clinical condition.  11/14 - further deterioration overnight.  Patient required intubation and MV.  Updated next of kin daughter Velva Harman this am and sent message to cardiology to discuss poor prognosis with family.     VITALS BP (!) 153/113   Pulse (!) 105   Temp 98.8 F (37.1 C) (Rectal)   Resp (!) 24   Ht _0  (1.702 m)  Wt 68.7 kg   SpO2 97%   BMI 23.72 kg/m    I/O last 3 completed shifts: In: 3650.6 [I.V.:3550.6; IV Piggyback:100] Out: 1200 [Urine:1200] No intake/output data recorded.  SpO2: 97 % O2 Flow Rate (L/min): 2  L/min FiO2 (%): (S) 40 %  Estimated body mass index is 23.72 kg/m as calculated from the following:   Height as of this encounter: _0  (1.702 m).   Weight as of this encounter: 68.7 kg.   REVIEW OF SYSTEMS    ROS - 10 point ROS uanble to obtain due to sedation on MV   PHYSICAL EXAMINATION  General: Age appropriate sedated on MV Head: Normocephalic, atraumatic Eyes: Pupils equal, round, reactive to light.  No scleral icterus.  Mouth: Moist mucosal membrane Neck: Supple, no JVD Pulmonary: Normal effort of breathing on supplemental oxygen, clear to auscultation bilaterally MV sounds in background Cardiovascular: patient with recurrent arrythmias but no MRG on exam  Gastrointestinal: Soft, nontender, non-distended.  Positive bowel sounds.   Musculoskeletal: No swelling, clubbing, or edema Neurological:sedated GCS 3T Skin: Intact, warm, dry  MEDICATIONS: I have reviewed all medications and confirmed regimen as documented Scheduled Meds: . allopurinol  300 mg Oral Daily  . apixaban  5 mg Oral BID  . aspirin EC  81 mg Oral Daily  . atorvastatin  40 mg Oral QHS  . chlorhexidine  15 mL Mouth Rinse BID  . chlorhexidine gluconate (MEDLINE KIT)  15 mL Mouth Rinse BID  . Chlorhexidine Gluconate Cloth  6 each Topical Daily  . dextrose  1 ampule Intravenous Once  . docusate  100 mg Oral BID  . feeding supplement  237 mL Oral BID BM  . fentaNYL (SUBLIMAZE) injection  25 mcg Intravenous Once  . insulin aspart  0-9 Units Subcutaneous TID AC & HS  . insulin aspart  10 Units Intravenous Once  . mouth rinse  15 mL Mouth Rinse q12n4p  . mouth rinse  15 mL Mouth Rinse 10 times per day  . metoprolol succinate  25 mg Oral Daily  . patiromer  25.2 g Oral Once  . polyethylene glycol  17 g Oral Daily  . sacubitril-valsartan  1 tablet Oral BID  . senna-docusate  1 tablet Oral BID  . sodium bicarbonate  50 mEq Intravenous Once  . sodium chloride flush  10-40 mL Intracatheter Q12H  . sodium  chloride flush  3 mL Intravenous Q12H   Continuous Infusions: . sodium chloride 10 mL/hr at 01/12/20 1915  . sodium chloride 250 mL (01/12/20 1919)  . dexmedetomidine (PRECEDEX) IV infusion Stopped (01/19/20 0405)  . famotidine (PEPCID) IV Stopped (01/18/20 1109)  . fentaNYL infusion INTRAVENOUS 100 mcg/hr (01/19/20 0558)  . lidocaine 4 mg/min (01/19/20 0558)  . magnesium sulfate bolus IVPB    . phenylephrine (NEO-SYNEPHRINE) Adult infusion 60 mcg/min (01/19/20 0558)  . propofol (DIPRIVAN) infusion Stopped (01/19/20 0545)   PRN Meds:.sodium chloride, acetaminophen, albuterol, diazepam, fentaNYL, haloperidol lactate, metoprolol tartrate, ondansetron (ZOFRAN) IV, oxyCODONE, polyethylene glycol, sodium chloride flush, sodium chloride flush   CULTURE RESULTS   No results found for this or any previous visit (from the past 240 hour(s)).     Intake/Output Summary (Last 24 hours) at 01/19/2020 0847 Last data filed at 01/19/2020 0558 Gross per 24 hour  Intake 2090.19 ml  Output 600 ml  Net 1490.19 ml     LABS  CBC Latest Ref Rng & Units 01/19/2020 01/18/2020 01/17/2020  WBC 4.0 - 10.5 K/uL 14.8(H) 19.4(H) 18.1(H)  Hemoglobin 13.0 -  17.0 g/dL 14.9 16.5 17.1(H)  Hematocrit 39 - 52 % 49.3 54.7(H) 54.8(H)  Platelets 150 - 400 K/uL 1,143(HH) 1,211(HH) 1,163(HH)   BMP Latest Ref Rng & Units 01/19/2020 01/19/2020 01/18/2020  Glucose 70 - 99 mg/dL 219(H) 696(HH) 233(H)  BUN 8 - 23 mg/dL 23 21 27(H)  Creatinine 0.61 - 1.24 mg/dL 1.08 1.00 1.19  Sodium 135 - 145 mmol/L 136 124(L) 141  Potassium 3.5 - 5.1 mmol/L 6.7(HH) 3.9 3.9  Chloride 98 - 111 mmol/L 106 96(L) 107  CO2 22 - 32 mmol/L 17(L) 17(L) 23  Calcium 8.9 - 10.3 mg/dL 8.8(L) 7.7(L) 9.0      IMAGING    DG Abd 1 View  Result Date: 01/19/2020 CLINICAL DATA:  ET tube and NG tube placement. EXAM: ABDOMEN - 1 VIEW COMPARISON:  None. FINDINGS: No NG tube identified over the lower chest/abdomen. Chest x-ray performed at the same  time showed NG tube looped upon itself in the hypopharynx/proximal esophagus. Nonspecific bowel gas pattern. Radiodensity overlying the right kidney may be a renal stone. IMPRESSION: 1. NG tube not visualized in the lower chest or upper abdomen. 2. Nonspecific bowel gas pattern. 3. Probable right renal stone. Electronically Signed   By: Misty Stanley M.D.   On: 01/19/2020 06:28   DG Chest Port 1 View  Result Date: 01/19/2020 CLINICAL DATA:  Respiratory failure. EXAM: PORTABLE CHEST 1 VIEW COMPARISON:  01/11/2020 FINDINGS: Endotracheal tube tip is 10.9 cm above the base of the carina, positioned at the sternal notch. NG tube appears to be looped upon itself in the hypopharynx/upper esophagus with the tip above the superior margin of the film. Right PICC line tip overlies the mid SVC level. The cardio pericardial silhouette is enlarged. Interstitial markings are diffusely coarsened with chronic features. There is pulmonary vascular congestion without overt pulmonary edema. Telemetry leads overlie the chest. IMPRESSION: 1. Endotracheal tube tip is 10.9 cm above the base of the carina, positioned at the sternal notch. This could likely be advance 4-5 cm for more appropriate positioning. 2. NG tube appears to be looped upon itself in the hypopharynx/upper esophagus with the tip above the superior margin of the film. Repositioning recommended. 3. Cardiomegaly with chronic interstitial changes. These results will be called to the ordering clinician or representative by the Radiologist Assistant, and communication documented in the PACS or [employer]. Electronically Signed   By: Misty Stanley M.D.   On: 01/19/2020 06:27     Nutrition Status: Nutrition Problem: Moderate Malnutrition Etiology: chronic illness (CHF) Signs/Symptoms: moderate fat depletion, moderate muscle depletion Interventions: Tube feeding, MVI   Indwelling Urinary Catheter continued, requirement due to   Reason to continue Indwelling  Urinary Catheter strict Intake/Output monitoring for hemodynamic instability   Central Line/ continued, requirement due to  Reason to continue [employer] Monitoring of central venous pressure or other hemodynamic parameters and poor IV access     ASSESSMENT AND PLAN SYNOPSIS [name] with severe respiratory failure due to metabolic acidosis with severe systolic CHF exacerbation, previous history of COVID-19 in the setting of drug abuse, undergoing therapy for HCAP, complicated by tachyarrhythmia and progressive heart failure.  ACUTE Hypoxic and Hypercapnic Respiratory Failure -s/p intubation overnight - 01/08/20 - possible related to emphysema and COPD with active smoking of tobacco and drugs and concomitant advanced CHF - Continue current care plan as per cardiology   - diurese as able  - wean O2 and SBT when patient parameters improve  - GOC with cardiology and palliative  service  Acute decompensated systolic CHF- EF 31% - Oxygen as needed - Lasix as tolerated - cardiology on case appreciate input   Regular wide complex tachyarrythmia  - SVT with aberarnt conduction ? -  - S/p electrical cardioversion x several  - Use pressors as needed to keep MAP >55 - Continue amiodarone -400 bID PO >>Amio gtt>>>lidocaine gtt>>consider mixeltine per cardiology  -cardiology on case appreciate  -EP connsult?      ID - therapy for CAP - Tracheal culture positive for MSSA - Continue IV abx as prescibed  GI - GI PROPHYLAXIS as indicated   DIET - NPO - Failed Yale 3oz swallow study today - Pending SLP evaluation - Constipation protocol as indicated  ENDO - Will use ICU hypoglycemic\Hyperglycemia protocol if indicated  ELECTROLYTES - Follow labs as needed - Replace as needed - Pharmacy consultation and following   DVT/GI PRX ordered and assessed TRANSFUSIONS AS NEEDED MONITOR FSBS I Assessed the need for Labs I Assessed the need for Foley I Assessed the need for Central  Venous Line Family Discussion when available I Assessed the need for Mobilization I made an Assessment of medications to be adjusted accordingly Safety Risk assessment completed   CASE DISCUSSED IN MULTIDISCIPLINARY ROUNDS WITH ICU TEAM  Critical Care Time devoted to patient care services described in this note is 33 minutes.   Overall, patient is critically ill, prognosis is guarded.  Patient with Multiorgan failure and at high risk for cardiac arrest and death.   I would recommend DNR/DNI status Recommend Palliative Care consultation   Ottie Glazier, M.D.  Pulmonary & [place]

## 2020-01-19 NOTE — TOC Progression Note (Signed)
Transition of Care [facility]) - Progression Note    Patient Details  Name: Travis Maxwell MRN: [id number] Date of Birth: Sep 15, 1950  Transition of Care [facility]) CM/SW Contact  [name], RN Phone Number: 01/19/2020, 4:47 PM  Clinical Narrative:    11/14 Condition deteriorated and required reintubation and mechanical ventilation per provider notes. Provider notified daughter, [name] to discuss situation. [name] RN CM    Expected Discharge Plan: Skilled Nursing Facility Barriers to Discharge: Continued Medical Work up, Other (comment) (Patient continues to be medically unstable.)  Expected Discharge Plan and Services Expected Discharge Plan: Skilled Nursing Facility In-house Referral: Clinical Social Work   Post Acute Care Choice: Skilled Nursing Facility Living arrangements for the past 2 months: Single Family Home                                       Social Determinants of Health (SDOH) Interventions    Readmission Risk Interventions No flowsheet data found.

## 2020-01-20 ENCOUNTER — Inpatient Hospital Stay: Payer: Medicare HMO

## 2020-01-20 DIAGNOSIS — I480 Paroxysmal atrial fibrillation: Secondary | ICD-10-CM | POA: Diagnosis not present

## 2020-01-20 DIAGNOSIS — Z720 Tobacco use: Secondary | ICD-10-CM

## 2020-01-20 DIAGNOSIS — R627 Adult failure to thrive: Secondary | ICD-10-CM | POA: Diagnosis not present

## 2020-01-20 DIAGNOSIS — I5023 Acute on chronic systolic (congestive) heart failure: Secondary | ICD-10-CM | POA: Diagnosis not present

## 2020-01-20 DIAGNOSIS — Z515 Encounter for palliative care: Secondary | ICD-10-CM | POA: Diagnosis not present

## 2020-01-20 DIAGNOSIS — Z7189 Other specified counseling: Secondary | ICD-10-CM | POA: Diagnosis not present

## 2020-01-20 DIAGNOSIS — I2510 Atherosclerotic heart disease of native coronary artery without angina pectoris: Secondary | ICD-10-CM | POA: Diagnosis not present

## 2020-01-20 DIAGNOSIS — J9601 Acute respiratory failure with hypoxia: Secondary | ICD-10-CM | POA: Diagnosis not present

## 2020-01-20 LAB — CBC WITH DIFFERENTIAL/PLATELET
Abs Immature Granulocytes: 0.21 10*3/uL — ABNORMAL HIGH (ref 0.00–0.07)
Basophils Absolute: 0.2 10*3/uL — ABNORMAL HIGH (ref 0.0–0.1)
Basophils Relative: 1 %
Eosinophils Absolute: 0.6 10*3/uL — ABNORMAL HIGH (ref 0.0–0.5)
Eosinophils Relative: 3 %
HCT: 50.1 % (ref 39.0–52.0)
Hemoglobin: 15 g/dL (ref 13.0–17.0)
Immature Granulocytes: 1 %
Lymphocytes Relative: 9 %
Lymphs Abs: 1.8 10*3/uL (ref 0.7–4.0)
MCH: 20.5 pg — ABNORMAL LOW (ref 26.0–34.0)
MCHC: 29.9 g/dL — ABNORMAL LOW (ref 30.0–36.0)
MCV: 68.4 fL — ABNORMAL LOW (ref 80.0–100.0)
Monocytes Absolute: 2.3 10*3/uL — ABNORMAL HIGH (ref 0.1–1.0)
Monocytes Relative: 11 %
Neutro Abs: 15.9 10*3/uL — ABNORMAL HIGH (ref 1.7–7.7)
Neutrophils Relative %: 75 %
Platelets: 932 10*3/uL (ref 150–400)
RBC: 7.32 MIL/uL — ABNORMAL HIGH (ref 4.22–5.81)
RDW: 25.3 % — ABNORMAL HIGH (ref 11.5–15.5)
Smear Review: INCREASED
WBC: 21 10*3/uL — ABNORMAL HIGH (ref 4.0–10.5)
nRBC: 0 % (ref 0.0–0.2)

## 2020-01-20 LAB — MAGNESIUM
Magnesium: 2 mg/dL (ref 1.7–2.4)
Magnesium: 1.8 mg/dL (ref 1.7–2.4)

## 2020-01-20 LAB — BASIC METABOLIC PANEL
Anion gap: 9 (ref 5–15)
BUN: 24 mg/dL — ABNORMAL HIGH (ref 8–23)
CO2: 22 mmol/L (ref 22–32)
Calcium: 8.1 mg/dL — ABNORMAL LOW (ref 8.9–10.3)
Chloride: 106 mmol/L (ref 98–111)
Creatinine, Ser: 1.09 mg/dL (ref 0.61–1.24)
GFR, Estimated: >60 mL/min (ref >60)
Glucose, Bld: 317 mg/dL — ABNORMAL HIGH (ref 70–99)
Potassium: 4 mmol/L (ref 3.5–5.1)
Sodium: 137 mmol/L (ref 135–145)

## 2020-01-20 LAB — PHOSPHORUS: Phosphorus: 3.3 mg/dL (ref 2.5–4.6)

## 2020-01-20 LAB — GLUCOSE, CAPILLARY
Glucose-Capillary: 120 mg/dL — ABNORMAL HIGH (ref 70–99)
Glucose-Capillary: 146 mg/dL — ABNORMAL HIGH (ref 70–99)
Glucose-Capillary: 150 mg/dL — ABNORMAL HIGH (ref 70–99)
Glucose-Capillary: 169 mg/dL — ABNORMAL HIGH (ref 70–99)
Glucose-Capillary: 183 mg/dL — ABNORMAL HIGH (ref 70–99)
Glucose-Capillary: 221 mg/dL — ABNORMAL HIGH (ref 70–99)

## 2020-01-20 LAB — TRIGLYCERIDES: Triglycerides: 48 mg/dL (ref <150)

## 2020-01-20 MED ORDER — ATORVASTATIN CALCIUM 20 MG PO TABS
40.0000 mg | ORAL_TABLET | Freq: Every day | ORAL | Status: DC
  Administered 2020-01-20 – 2020-01-24 (×5): 40 mg
  Filled 2020-01-20 (×5): qty 2

## 2020-01-20 MED ORDER — LORAZEPAM 2 MG/ML IJ SOLN
INTRAMUSCULAR | Status: AC
  Administered 2020-01-20: 4 mg via INTRAVENOUS
  Filled 2020-01-20: qty 2

## 2020-01-20 MED ORDER — POLYETHYLENE GLYCOL 3350 17 G PO PACK
17.0000 g | PACK | Freq: Every day | ORAL | Status: DC | PRN
  Administered 2020-01-21: 17 g

## 2020-01-20 MED ORDER — POLYETHYLENE GLYCOL 3350 17 G PO PACK
17.0000 g | PACK | Freq: Every day | ORAL | Status: DC
  Administered 2020-01-21 – 2020-01-23 (×3): 17 g
  Filled 2020-01-20 (×3): qty 1

## 2020-01-20 MED ORDER — DIAZEPAM 2 MG PO TABS: 2.0000 mg | ORAL_TABLET | Freq: Four times a day (QID) | ORAL | Status: DC | PRN

## 2020-01-20 MED ORDER — APIXABAN 5 MG PO TABS
5.0000 mg | ORAL_TABLET | Freq: Two times a day (BID) | ORAL | Status: DC
  Administered 2020-01-20 – 2020-01-21 (×2): 5 mg
  Filled 2020-01-20 (×2): qty 1

## 2020-01-20 MED ORDER — ALLOPURINOL 300 MG PO TABS
300.0000 mg | ORAL_TABLET | Freq: Every day | ORAL | Status: DC
  Administered 2020-01-21 – 2020-01-25 (×5): 300 mg
  Filled 2020-01-20 (×5): qty 1

## 2020-01-20 MED ORDER — VITAL AF 1.2 CAL PO LIQD
1000.0000 mL | ORAL | Status: DC
  Administered 2020-01-20 – 2020-01-24 (×4): 1000 mL

## 2020-01-20 MED ORDER — DOCUSATE SODIUM 50 MG/5ML PO LIQD
100.0000 mg | Freq: Two times a day (BID) | ORAL | Status: DC
  Administered 2020-01-20 – 2020-01-25 (×6): 100 mg
  Filled 2020-01-20 (×7): qty 10

## 2020-01-20 MED ORDER — ASPIRIN 81 MG PO CHEW
81.0000 mg | CHEWABLE_TABLET | Freq: Every day | ORAL | Status: DC
  Administered 2020-01-21 – 2020-01-25 (×5): 81 mg
  Filled 2020-01-20 (×5): qty 1

## 2020-01-20 MED ORDER — LORAZEPAM 2 MG/ML IJ SOLN: 4.0000 mg | Freq: Once | INTRAMUSCULAR | Status: AC

## 2020-01-20 MED ORDER — PROSOURCE TF PO LIQD
45.0000 mL | Freq: Every day | ORAL | Status: DC
  Administered 2020-01-21 – 2020-01-25 (×5): 45 mL
  Filled 2020-01-20 (×9): qty 45

## 2020-01-20 MED ORDER — OXYCODONE HCL 5 MG PO TABS: 5.0000 mg | ORAL_TABLET | Freq: Four times a day (QID) | ORAL | Status: DC | PRN

## 2020-01-20 MED ORDER — VITAL AF 1.2 CAL PO LIQD: 1000.0000 mL | ORAL | Status: DC

## 2020-01-20 MED ORDER — ACETAMINOPHEN 325 MG PO TABS: 650.0000 mg | ORAL_TABLET | ORAL | Status: DC | PRN

## 2020-01-20 MED ORDER — SENNOSIDES-DOCUSATE SODIUM 8.6-50 MG PO TABS
1.0000 | ORAL_TABLET | Freq: Two times a day (BID) | ORAL | Status: DC
  Administered 2020-01-20 – 2020-01-25 (×7): 1
  Filled 2020-01-20 (×8): qty 1

## 2020-01-20 MED ORDER — SACUBITRIL-VALSARTAN 24-26 MG PO TABS
1.0000 | ORAL_TABLET | Freq: Two times a day (BID) | ORAL | Status: DC
  Administered 2020-01-20 – 2020-01-21 (×2): 1
  Filled 2020-01-20 (×3): qty 1

## 2020-01-20 MED FILL — Medication: Qty: 1 | Status: AC

## 2020-01-20 NOTE — Progress Notes (Signed)
GOALS OF CARE DISCUSSION  The Clinical status was relayed to family in detail-Daughter Gwendolyn  Updated and notified of patients medical condition.  patient with increased WOB and using accessory muscles to breathe Explained to family course of therapy and the modalities     Patient with Progressive multiorgan failure with very low chance of meaningful recovery despite all aggressive and optimal medical therapy. Patient is in the Dying  Process associated with Suffering.  Family understands the situation.  Family are satisfied with Plan of action and management. All questions answered  Additional CC time 32 mins   Chaley Castellanos Santiago Glad, M.D.  [facility]  Medical Director [facility] Medical Director [facility] Cardio-Pulmonary Department

## 2020-01-20 NOTE — Progress Notes (Signed)
PHARMACY CONSULT NOTE - FOLLOW UP  Pharmacy Consult for Electrolyte Monitoring and Replacement   Recent Labs: Potassium (mmol/L)  Date Value  01/20/2020 4.0   Magnesium (mg/dL)  Date Value  32/99/2426 2.0   Calcium (mg/dL)  Date Value  83/41/9622 8.1 (L)   Albumin (g/dL)  Date Value  29/79/8921 3.2 (L)   Phosphorus (mg/dL)  Date Value  19/41/7408 3.3   Sodium (mmol/L)  Date Value  01/20/2020 137   Assessment: 69 year old male transferred to ICU for acute toxic metabolic encephalopathy. Patient now more alert and awake. Overnight 11/10, patient with two runs pulseless VT, with second episode requiring CPR and defibrillation. Converted back into baseline bundle branch rhythm with rate in 50's-60's.  Pt received magnesium 2 gm during episode. Similar episodes 11/11 requiring defibrillation. Pharmacy to manage electrolytes.  Patient intubated 11/14.  Goal of Therapy:  Electrolytes WNL  Plan:  Potassium has trended down from previously elevated level. Will follow trend and replace as indicated. Follow daily.  Pricilla Riffle ,PharmD Clinical Pharmacist 01/20/2020 2:42 PM

## 2020-01-20 NOTE — Progress Notes (Signed)
CRITICAL CARE NOTE [name] presenting to the ED on 01/06/2020 in respiratory distress, complaining of chest pain and shortness of breath. He also reported orthopnea and increased lower extremity swelling. History was limited due to acuity of condition and respiratory status. Patient was placed on biPAP, but progressively worsened and was emergently intubated. Patient is a current 0.5ppd smoker and uses marijuana. He has a severe metabolic acidosis. Patient has a past medical history significant for HTN, T2DM, HLD, GERD, CAD (s/p PCI), AVR with bioprosthetic valve, systolic CHF with EF of 02%, and paroxysmal A. Fib, currently anticoagulated on Eliquis. EKG upon arrival to ED revealed sinus rhythm at a rate of 99bpm with RBBB and PVCs with ST elevation in II, III, avF, V4-V6. Cardiologist on call stated that STEMI criteria were not met and advised to manage CHF. Patient received IV diltiazem and reported improvement of symptoms. Labs were notable for high sensitivity troponin 71->91->89. BNP 1046, creatinine 1.26, BUN 21, potassium 5.4. CXR showed improved pulmonary edema since prior study with chronic pulmonary venous hypertension. Notably, patient was admitted to [facility] ED 12/04/2019 for acute respiratory failure and acute on chronic systolic CHF in the setting of COVID pneumonia.  SIGNIFICANT EVENTS 11/2 - severe cardiogenic shock, remains on vent 11/3 - severe respiratory failure, severe systolic CHF 72/5 - patient remains critically ill 11/5 - patient remains critically ill 11/6 - off pressors, extubated to biPAP 11/7 - remains extubated, febrile, MSSA+ tracheal culture - started ancef  11/8 - remains extubated, on 2L by nasal cannula. Afebrile today with white count of 21.3K, up from 15.7K yesterday. Still encephalopathic and drowsy. On and off Precedex. Went into narrow complex SVT,then progressed to wide complex tachycardiawas shocked and given a loading dose of amiodarone. Will remain on  amiodarone gtt.Cardiology at bedside.   11/9 - remains extubated, on 2L by nasal cannula. Afebrile today with white count of 19.2K, down since yesterday. BP soft overnight, was started on Neo-synephrine infusion. More alert than yesterday, but only oriented to self. Failed Yale 3oz swallow study early this morning, started coughing at the end of the study. Will need to remain NPO until cleared by SLP. Patient has been educated on this, yet continues to incessantly ask for water.   11/10- patient clinically improved. He is alert communicative and in no distress. He ate entire meal without issues. Still has confusion intermittently during interview. Frequent PVCs on telemetry.  Will initiate OT/PT today for downgrade planning. 11/11- patient minimally symptomatic, he again had episodes of Torsades today s/p 120Jx1 mag 2g.  Poor prognosis overall with advanced CHF.  11/12- Dicussed case with cardiology. Antiarrythmic agents being modified. Will also call consult for Electrophysiology  11/13-  Patient with no arrythmia overnight. He remains on precedex and neosynephrine.  I spoke with cardiology Dr Rayann Heman regarding EP evaluation- he recommended to continue with current medical therapy and have cardiologist call to EP on Monday to review options.  He has 1:1 sitter and is resting in bed comfortably. Spoke with Velva Harman POA daughter to update on poor prognosis worsening clinical condition.  11/14 - further deterioration overnight.  Patient required intubation and MV.  Updated next of kin daughter Velva Harman this am and sent message to cardiology to discuss poor prognosis with family.    11/15 high risk for cardiac arrest and death  CC  follow up respiratory failure  SUBJECTIVE Patient remains critically ill Prognosis is guarded   BP 114/74   Pulse 65   Temp 99.3 F (  37.4 C) (Rectal)   Resp 18   Ht 5' 7"  (1.702 m)   Wt 67.8 kg   SpO2 98%   BMI 23.41 kg/m    I/O last 3 completed shifts: In: 2589.2  [I.V.:2539.2; IV Piggyback:50] Out: 1150 [Urine:1150] Total I/O In: -  Out: 100 [Urine:100]  SpO2: 98 % O2 Flow Rate (L/min): 2 L/min FiO2 (%): 40 %  Estimated body mass index is 23.41 kg/m as calculated from the following:   Height as of this encounter: 5' 7"  (1.702 m).   Weight as of this encounter: 67.8 kg.  SIGNIFICANT EVENTS   REVIEW OF SYSTEMS  PATIENT IS UNABLE TO PROVIDE COMPLETE REVIEW OF SYSTEMS DUE TO SEVERE CRITICAL ILLNESS        PHYSICAL EXAMINATION:  GENERAL:critically ill appearing, +resp distress HEAD: Normocephalic, atraumatic.  EYES: Pupils equal, round, reactive to light.  No scleral icterus.  MOUTH: Moist mucosal membrane. NECK: Supple.  PULMONARY: +rhonchi, +wheezing CARDIOVASCULAR: S1 and S2. Regular rate and rhythm. No murmurs, rubs, or gallops.  GASTROINTESTINAL: Soft, nontender, -distended.  Positive bowel sounds.   MUSCULOSKELETAL: No swelling, clubbing, or edema.  NEUROLOGIC: obtunded, GCS<8 SKIN:intact,warm,dry  MEDICATIONS: I have reviewed all medications and confirmed regimen as documented   CULTURE RESULTS   Recent Results (from the past 240 hour(s))  Culture, respiratory     Status: None (Preliminary result)   Collection Time: 01/19/20  3:55 PM   Specimen: Tracheal Aspirate; Respiratory  Result Value Ref Range Status   Specimen Description   Final    TRACHEAL ASPIRATE Performed at [facility], [address] [place], [place] [address]    Special Requests   Final    NONE Performed at [facility], [address] [place], [place] [address]    Gram Stain   Final    MODERATE WBC PRESENT, PREDOMINANTLY PMN ABUNDANT GRAM POSITIVE COCCI IN PAIRS IN CLUSTERS ABUNDANT GRAM NEGATIVE RODS Performed at [facility], [place] [address] [place], [place] [address]    Culture PENDING  Incomplete   Report Status PENDING  Incomplete          IMAGING    DG Chest Port 1 View  Result Date:  01/19/2020 CLINICAL DATA:  OG tube placement. EXAM: PORTABLE CHEST 1 VIEW COMPARISON:  Earlier same day FINDINGS: 0820 hours. Leftward patient rotation. The cardio pericardial silhouette is enlarged. NG tube/OG tube is positioned in the proximal stomach with proximal side port just above the expected location of the esophagogastric junction. IMPRESSION: NG tube tip projects over the proximal stomach. Electronically Signed   By: Misty Stanley M.D.   On: 01/19/2020 09:21     Nutrition Status: Nutrition Problem: Moderate Malnutrition Etiology: chronic illness (CHF) Signs/Symptoms: moderate fat depletion, moderate muscle depletion Interventions: Tube feeding, MVI     Indwelling Urinary Catheter continued, requirement due to   Reason to continue Indwelling Urinary Catheter strict Intake/Output monitoring for hemodynamic instability   Central Line/ continued, requirement due to  Reason to continue [place] of central venous pressure or other hemodynamic parameters and poor IV access   Ventilator continued, requirement due to severe respiratory failure   Ventilator Sedation RASS 0 to -2      ASSESSMENT AND PLAN SYNOPSIS [name] with severe respiratory failure due to metabolic acidosis with severe systolic CHF exacerbation, previous history of COVID-19 in the setting of drug abuse, undergoing therapy for HCAP, complicated by tachyarrhythmia and progressive heart failure. Patient with recurrent of resp  failure requiring intubation  Severe ACUTE Hypoxic and Hypercapnic Respiratory Failure -continue Full MV support -continue Bronchodilator Therapy -Wean Fio2 and PEEP as tolerated -VAP/VENT bundle implementation Patient will need trach and PEG tube to survive  ACUTE SYSTOLIC CARDIAC FAILURE- EF -oxygen as needed -Lasix as tolerated -follow up cardiac enzymes as indicated -follow up cardiology recs Second opinion from [facility] pending   ACUTE KIDNEY INJURY/Renal  Failure -continue Foley Catheter-assess need -Avoid nephrotoxic agents -Follow urine output, BMP -Ensure adequate renal perfusion, optimize oxygenation -Renal dose medications     NEUROLOGY Acute toxic metabolic encephalopathy, need for sedation Goal RASS -2 to -3  SHOCK-CARDIOGENIC -use vasopressors to keep MAP>65  CARDIAC ICU monitoring  GI GI PROPHYLAXIS as indicated  NUTRITIONAL STATUS Nutrition Status: Nutrition Problem: Moderate Malnutrition Etiology: chronic illness (CHF) Signs/Symptoms: moderate fat depletion, moderate muscle depletion Interventions: Tube feeding, MVI   DIET-->TF's as tolerated Constipation protocol as indicated  ENDO - will use ICU hypoglycemic\Hyperglycemia protocol if indicated     ELECTROLYTES -follow labs as needed -replace as needed -pharmacy consultation and following   DVT/GI PRX ordered and assessed TRANSFUSIONS AS NEEDED MONITOR FSBS I Assessed the need for Labs I Assessed the need for Foley I Assessed the need for Central Venous Line Family Discussion when available I Assessed the need for Mobilization I made an Assessment of medications to be adjusted accordingly Safety Risk assessment completed   CASE DISCUSSED IN MULTIDISCIPLINARY ROUNDS WITH ICU TEAM  Critical Care Time devoted to patient care services described in this note is 54 minutes.   Overall, patient is critically ill, prognosis is guarded.  Patient with Multiorgan failure and at high risk for cardiac arrest and death.    Corrin Parker, M.D.  [facility]  Medical Director [place] Director [facility] Cardio-Pulmonary Department

## 2020-01-20 NOTE — Progress Notes (Addendum)
Daily Progress Note   Patient Name: Travis Maxwell       Date: 01/20/2020 DOB: Sep 22, 1950  Age: 69 y.o. MRN#: [id number] Attending Physician: Flora Lipps, MD Primary Care Physician: [facility] [place] Date: 01/06/2020  Reason for Consultation/Follow-up: Establishing goals of care  Subjective: Patient is resting in bed on ventilator. Spoke with daughter Travis Maxwell. She states she and her sisters are making decisions together. She states they have been kept updated, and she discusses in detail his health issues.   She tells me their mother was clear she would never want to be placed on a ventilator, but he indicated he would want to do what he could to try to live. Travis Maxwell tells me he would not want to live on a ventilator, and would not want a tracheostomy. She states she will continue to speak with her sisters about what if's, and care moving forward. She discusses concerns for oversedation. Discussed purpose of sedation.  Discussed that she can request to speak with [place], but that I would call her again later this week, or earlier if needed.    Length of Stay: 14  Current Medications: Scheduled Meds:  . [START ON 01/21/2020] allopurinol  300 mg Per Tube Daily  . apixaban  5 mg Per Tube BID  . [START ON 01/21/2020] aspirin  81 mg Per Tube Daily  . atorvastatin  40 mg Per Tube QHS  . chlorhexidine  15 mL Mouth Rinse BID  . chlorhexidine gluconate (MEDLINE KIT)  15 mL Mouth Rinse BID  . Chlorhexidine Gluconate Cloth  6 each Topical Daily  . docusate  100 mg Per Tube BID  . [START ON 01/21/2020] feeding supplement (PROSource TF)  45 mL Per Tube Daily  . fentaNYL (SUBLIMAZE) injection  25 mcg Intravenous Once  . insulin aspart  0-9 Units Subcutaneous Q4H  . mouth rinse  15 mL Mouth  Rinse 10 times per day  . metoprolol succinate  25 mg Oral Daily  . [START ON 01/21/2020] polyethylene glycol  17 g Per Tube Daily  . sacubitril-valsartan  1 tablet Per Tube BID  . senna-docusate  1 tablet Per Tube BID  . sodium chloride flush  10-40 mL Intracatheter Q12H  . sodium chloride flush  3 mL Intravenous Q12H    Continuous Infusions: . sodium chloride 10  mL/hr at 01/12/20 1915  . sodium chloride 250 mL (01/20/20 0907)  . famotidine (PEPCID) IV Stopped (01/20/20 2229)  . feeding supplement (VITAL AF 1.2 CAL)    . fentaNYL infusion INTRAVENOUS 100 mcg/hr (01/20/20 1519)  . lidocaine 2 mg/min (01/20/20 1200)  . phenylephrine (NEO-SYNEPHRINE) Adult infusion 60 mcg/min (01/20/20 1519)  . propofol (DIPRIVAN) infusion 10 mcg/kg/min (01/20/20 1519)    PRN Meds: sodium chloride, acetaminophen, albuterol, diazepam, fentaNYL, haloperidol lactate, metoprolol tartrate, ondansetron (ZOFRAN) IV, oxyCODONE, polyethylene glycol, sodium chloride flush, sodium chloride flush  Physical Exam Pulmonary:     Comments: On ventilator.             Vital Signs: BP (!) 79/67   Pulse 77   Temp 99.5 F (37.5 C)   Resp 20   Ht 5' 7" (1.702 m)   Wt 67.8 kg   SpO2 95%   BMI 23.41 kg/m  SpO2: SpO2: 95 % O2 Device: O2 Device: Ventilator O2 Flow Rate: O2 Flow Rate (L/min): 2 L/min  Intake/output summary:   Intake/Output Summary (Last 24 hours) at 01/20/2020 1532 Last data filed at 01/20/2020 1519 Gross per 24 hour  Intake 1964.63 ml  Output 905 ml  Net 1059.63 ml   LBM: Last BM Date:  (pta) Baseline Weight: Weight: 72.6 kg Most recent weight: Weight: 67.8 kg       Palliative Assessment/Data:    Flowsheet Rows     Most Recent Value  Intake Tab  Referral Department Critical care  Unit at Time of Referral ICU  Palliative Care Primary Diagnosis Cardiac  Date Notified 01/13/20  Palliative Care Type New Palliative care  Reason for referral Clarify Goals of Care  Date of  Admission 01/06/20  Date first seen by Palliative Care 01/14/20  # of days Palliative referral response time 1 Day(s)  # of days IP prior to Palliative referral 7  Clinical Assessment  Psychosocial & Spiritual Assessment  Palliative Care Outcomes      Patient Active Problem List   Diagnosis Date Noted  . Delirium   . Adult failure to thrive   . Palliative care by specialist   . DNR (do not resuscitate) discussion   . Malnutrition of moderate degree 01/07/2020  . Respiratory failure ([place]) 01/06/2020  . Elevated troponin 11/30/2019  . Leukocytosis 11/30/2019  . PAF (paroxysmal atrial fibrillation) ([place]) 11/30/2019  . Acute on chronic systolic congestive heart failure ([place]) 11/30/2019  . Pneumonia due to COVID-19 virus 11/30/2019  . Chest pain 08/13/2019  . Diabetes mellitus without complication ([place])   . Gout   . Hypertension   . Dizziness   . Acute on chronic systolic CHF (congestive heart failure) ([place])   . Tobacco abuse   . GERD (gastroesophageal reflux disease)   . CAD (coronary artery disease)   . NSTEMI (non-ST elevated myocardial infarction) ([place]) 07/24/2017    Palliative Care Assessment & Plan   Recommendations/Plan: Continue ventilator support. Family will continue to talk.     Code Status:    Code Status Orders  (From admission, onward)         Start     Ordered   01/06/20 1331  Full code  Continuous        01/06/20 1333        Code Status History    Date Active Date Inactive Code Status Order ID Comments User Context   11/30/2019 1512 12/02/2019 1908 Full Code [id number]  Ivor Costa, MD ED   08/13/2019 1336 08/14/2019 2053  Full Code [id number]  Ivor Costa, MD Inpatient   07/25/2017 0127 07/26/2017 1919 Full Code [id number]  Vaughan Basta, MD Inpatient   Advance Care Planning Activity      Prognosis: Poor overall   Thank you for allowing the Palliative Medicine Team to assist in the care of this patient.   Total Time 25 min Prolonged Time  Billed  no      Greater than 50%  of this time was spent counseling and coordinating care related to the above assessment and plan.  Asencion Gowda, NP  Please contact Palliative Medicine Team phone at [phone] for questions and concerns.

## 2020-01-20 NOTE — Progress Notes (Signed)
[facility] visited pt.'s rm. upon seeing pt.'s gdtr. at bedside; grdtr. talking to pt., reading Bible passages, playing music on phone; no needs expressed at this time, but gdtr. aware of [facility] availability.

## 2020-01-20 NOTE — Consult Note (Signed)
Cardiology Consultation:   Patient ID: Travis Maxwell MRN: [id number]; DOB: [birth year]  Admit date: 01/06/2020 Date of Consult: 01/20/2020  Primary Care Provider: [facility] Cardiologist: [facility] cardiology [place] Electrophysiologist:  None    Patient Profile:   Travis Maxwell is a 69 y.o. male with a hx of coronary artery disease, HFrEF, VT, COVID-19, aortic insufficiency and stenosis s/p AVR with Edwards magna tissue 03/15/2018, CAD s/p DES to the mid RCA 07/2017, PAF on PTA amiodarone and Eliquis, hypertension, hyperlipidemia, tobacco use, marijuana use, DM2, gout, and who is being seen today for the evaluation of NSTEMI at the request of Dr. Mortimer Fries.  History of Present Illness:   Travis Maxwell male initially seen by [facility] cardiology for the evaluation of chest pain.  He has a history of CAD s/p DES to the mid RCA 06/979, chronic systolic CHF with LVEF less than 20%, aortic insufficiency and stenosis, s/p aortic valve replacement with Edwards magna tissue 03/15/2018, paroxysmal atrial fibrillation on Eliquis and home amiodarone, DM2, hypertension, hyperlipidemia, tobacco, and marijuana use.  He has tested positive for COVID-19 11/30/2019 without retest to date.   [facility] cardiology has been asked to provide a second opinion regarding his CAD and cardiac outlook.  Of note, he was previously admitted to [facility] 12/04/2019 for acute respiratory failure and acute on chronic systolic heart failure in setting of COVID-19 pneumonia.  Echo 08/2019 showed severely reduced LVSF with EF less than 20%, moderate to severe MR, mild to moderate TR.  He presented to [facility] ED 01/06/2019. EKG showed NSR with rate 99 bpm, RBBB, PVCs, and ST elevation in 2, 3, aVF, V4 to V6.  Dr. Fletcher Anon, on STEMI call, stated EKG did not meet STEMI criteria.  He was placed on BiPAP; however, due to progressively worsening DOE, emergently intubated.  He received IV diltiazem with reported improvement  in symptoms.  High-sensitivity troponin 71  91  89 (191 one month prior).  BNP 1046.  Creatinine 1.26, BUN 21, potassium 5.4.  CXR showed improved pulmonary edema since prior study with chronic pulmonary venous hypertension.  He was started on vasopressors and IV lasix. Recommendation by [facility] cardiology was to defer further ischemic workup and consider discussion of AICD as an OP. Amiodarone was discontinued once pt maintaining NSR.  He was extubated 11/6. On 11/8, he He developed narrow complex SVT then [facility] s/p DCCV per documentation by [facility] on 11/9  and started on amiodarone drip with NSR and PVCs. He was noted to be hypokalemic.  On 11/11, he reportedly had 2 episodes of pulseless VT. The second episode required CPR and defibrillation. He was treated with 2g Mg and remained in NSR. Overnight 11/11-1/12, he then went back into WCT, initially treated with IV amiodarone without success. Synchronized defibrillation was then performed 3x and amiodarone discontinued. It was noted that he appeared to have had torsades. He was started on a lidocaine drip and K replaced. He then converted back to NSR.  EP was consulted over the weekend with recommendation for continued medical therapy and to call EP again on Monday 11/15. He was intubated 11/14 due to respiratory distress. Per [facility] cardiology, aggressive medical therapy was recommended for support of heart failure but no further mechanical intervention. It was thought that his AMS was unrelated to his HF or arrhythmias. Prognosis was poor, which was discussed with the pt's daughter, as well as the recommendation for palliative care. It was thought that transferring to a tertiary care facility may not  add any additional benefit but transfer not opposed if family and team thought best to do so. A second opinion was requested by [facility] cardiology.    Past Medical History:  Diagnosis Date  . CAD (coronary artery disease)   . Chronic systolic (congestive) heart failure ([place])    . Diabetes mellitus without complication ([place])   . GERD (gastroesophageal reflux disease)   . Gout   . Hx of aortic valve replacement    Bioprosthetic valve  . Hypertension     Past Surgical History:  Procedure Laterality Date  . CORONARY STENT INTERVENTION N/A 07/25/2017   Procedure: CORONARY STENT INTERVENTION;  Surgeon: Yolonda Kida, MD;  Location: [place] [facility];  Service: Cardiovascular;  Laterality: N/A;  . LEFT HEART CATH AND CORONARY ANGIOGRAPHY N/A 07/25/2017   Procedure: LEFT HEART CATH AND CORONARY ANGIOGRAPHY;  Surgeon: Corey Skains, MD;  Location: [place] [facility];  Service: Cardiovascular;  Laterality: N/A;     Home Medications:  Prior to Admission medications   Medication Sig Start Date End Date Taking? Authorizing Provider  albuterol (VENTOLIN HFA) 108 (90 Base) MCG/ACT inhaler Inhale 2 puffs into the lungs every 4 (four) hours as needed for wheezing or shortness of breath. 12/02/19  Yes Fritzi Mandes, MD  allopurinol (ZYLOPRIM) 300 MG tablet Take 300 mg by mouth daily. 03/22/18  Yes [provider]  apixaban (ELIQUIS) 5 MG TABS tablet Take 1 tablet (5 mg total) by mouth 2 (two) times daily. 06/13/19  Yes Menshew, Dannielle Karvonen, PA-C  aspirin EC 81 MG EC tablet Take 1 tablet (81 mg total) by mouth daily. 08/15/19  Yes Florencia Reasons, MD  colchicine 0.6 MG tablet Take 2 tabs PO x 1, then 1 tab PO 1 hour later x 1 Max: 1.8 mg total dose per attack, do not repeat within 3 days. 06/13/19  Yes Menshew, Dannielle Karvonen, PA-C  metoprolol succinate (TOPROL-XL) 25 MG 24 hr tablet Take 25 mg by mouth daily.   Yes [provider]  nitroGLYCERIN (NITROSTAT) 0.4 MG SL tablet Place 1 tablet (0.4 mg total) under the tongue every 5 (five) minutes as needed for chest pain. 08/14/19  Yes Florencia Reasons, MD  sitaGLIPtin (JANUVIA) 100 MG tablet Take 100 mg by mouth daily.   Yes [provider]    Inpatient Medications: Scheduled Meds: . allopurinol  300 mg Oral  Daily  . apixaban  5 mg Oral BID  . aspirin EC  81 mg Oral Daily  . atorvastatin  40 mg Oral QHS  . chlorhexidine  15 mL Mouth Rinse BID  . chlorhexidine gluconate (MEDLINE KIT)  15 mL Mouth Rinse BID  . Chlorhexidine Gluconate Cloth  6 each Topical Daily  . docusate  100 mg Oral BID  . feeding supplement  237 mL Oral BID BM  . fentaNYL (SUBLIMAZE) injection  25 mcg Intravenous Once  . insulin aspart  0-9 Units Subcutaneous Q4H  . mouth rinse  15 mL Mouth Rinse 10 times per day  . metoprolol succinate  25 mg Oral Daily  . polyethylene glycol  17 g Oral Daily  . sacubitril-valsartan  1 tablet Oral BID  . senna-docusate  1 tablet Oral BID  . sodium chloride flush  10-40 mL Intracatheter Q12H  . sodium chloride flush  3 mL Intravenous Q12H   Continuous Infusions: . sodium chloride 10 mL/hr at 01/12/20 1915  . sodium chloride 250 mL (01/20/20 0907)  . dexmedetomidine (PRECEDEX) IV infusion 0.6 mcg/kg/hr (  01/20/20 0800)  . famotidine (PEPCID) IV 20 mg (01/20/20 0908)  . fentaNYL infusion INTRAVENOUS Stopped (01/20/20 0845)  . lidocaine 2 mg/min (01/20/20 0800)  . phenylephrine (NEO-SYNEPHRINE) Adult infusion 60 mcg/min (01/20/20 0845)  . propofol (DIPRIVAN) infusion Stopped (01/19/20 0545)   PRN Meds: sodium chloride, acetaminophen, albuterol, diazepam, fentaNYL, haloperidol lactate, metoprolol tartrate, ondansetron (ZOFRAN) IV, oxyCODONE, polyethylene glycol, sodium chloride flush, sodium chloride flush  Allergies:    Allergies  Allergen Reactions  . Penicillins Other (See Comments)    Has patient had a PCN reaction causing immediate rash, facial/tongue/throat swelling, SOB or lightheadedness with hypotension: Unknown Has patient had a PCN reaction causing severe rash involving mucus membranes or skin necrosis: Unknown Has patient had a PCN reaction that required hospitalization: Unknown Has patient had a PCN reaction occurring within the last 10 years: No If all of the above  answers are "NO", then may proceed with Cephalosporin use.     Social History:   Social History   Socioeconomic History  . Marital status: Divorced    Spouse name: Not on file  . Number of children: Not on file  . Years of education: Not on file  . Highest education level: Not on file  Occupational History  . Not on file  Tobacco Use  . Smoking status: Current Every Day Smoker    Packs/day: 0.50    Types: Cigarettes    Last attempt to quit: 08/10/2017    Years since quitting: 2.4  . Smokeless tobacco: Never Used  Vaping Use  . Vaping Use: Never used  Substance and Sexual Activity  . Alcohol use: Not Currently  . Drug use: Yes    Types: Marijuana    Comment: today  . Sexual activity: Not on file  Other Topics Concern  . Not on file  Social History Narrative  . Not on file   Social Determinants of Health   Financial Resource Strain:   . Difficulty of Paying Living Expenses: Not on file  Food Insecurity:   . Worried About Charity fundraiser in the Last Year: Not on file  . Ran Out of Food in the Last Year: Not on file  Transportation Needs:   . Lack of Transportation (Medical): Not on file  . Lack of Transportation (Non-Medical): Not on file  Physical Activity:   . Days of Exercise per Week: Not on file  . Minutes of Exercise per Session: Not on file  Stress:   . Feeling of Stress : Not on file  Social Connections:   . Frequency of Communication with Friends and Family: Not on file  . Frequency of Social Gatherings with Friends and Family: Not on file  . Attends Religious Services: Not on file  . Active Member of Clubs or Organizations: Not on file  . Attends Archivist Meetings: Not on file  . Marital Status: Not on file  Intimate Partner Violence:   . Fear of Current or Ex-Partner: Not on file  . Emotionally Abused: Not on file  . Physically Abused: Not on file  . Sexually Abused: Not on file    Family History:    Family History  Problem  Relation Age of Onset  . CAD Mother   . CAD Father      ROS:  Please see the history of present illness.  Review of Systems  Unable to perform ROS: Acuity of condition  All other systems reviewed and are negative.   All other ROS reviewed and  negative.     Physical Exam/Data:   Vitals:   01/20/20 0730 01/20/20 0800 01/20/20 0845 01/20/20 0900  BP: 114/74 109/71 123/78 115/80  Pulse: 65 68 70 77  Resp: _0 Temp: 99.3 F (37.4 C) 99.5 F (37.5 C) 99.3 F (37.4 C) 99.3 F (37.4 C)  TempSrc: Rectal Rectal    SpO2: 98% 96% 98% 96%  Weight:      Height:        Intake/Output Summary (Last 24 hours) at 01/20/2020 0946 Last data filed at 01/20/2020 0900 Gross per 24 hour  Intake 1694.99 ml  Output 695 ml  Net 999.99 ml   Last 3 Weights 01/20/2020 01/19/2020 01/18/2020  Weight (lbs) 149 lb 7.6 oz 151 lb 7.3 oz 153 lb  Weight (kg) 67.8 kg 68.7 kg 69.4 kg     Body mass index is 23.41 kg/m.  General:  Intubated and sedated. Critically ill.  HEENT: normal Lymph: no adenopathy Neck: JVD difficult to assess given intubated and sedated Endocrine:  No thryomegaly Vascular: Unable to appreciate if bruits due to intubation and sedation, unable to appreciate pulses due to bilateral mitts in place 2/2 agitation Cardiac:  normal S1, S2; RRR; 2/6 systolic murmur RUSB and LLSB Lungs:  Bilateral coarse breath sounds, bilateral rhonchi versus crackles and best appreciated at left lung base Abd: soft, nontender, no hepatomegaly  Ext: no significant pitting edema, bilateral SCDs in place Musculoskeletal:  No deformities, BUE and BLE strength normal and equal Skin: warm and dry  Neuro:  CNs 2-12 intact, no focal abnormalities noted Psych:  Normal affect   EKG:  The EKG was personally reviewed and demonstrates:  01/19/20 EKG not yet released Telemetry:  Telemetry was personally reviewed and demonstrates: NSR 60s to 70s  Relevant CV Studies: Echo 08/13/19 1. Left ventricular  ejection fraction, by estimation, is <20%. The left  ventricle has severely decreased function. The left ventricle demonstrates  global hypokinesis. The left ventricular internal cavity size was severely  dilated. There is mild left  ventricular hypertrophy. Left ventricular diastolic parameters were  normal.  2. Right ventricular systolic function is normal. The right ventricular  size is normal.  3. Left atrial size was moderately dilated.  4. Right atrial size was mildly dilated.  5. The mitral valve is normal in structure. Moderate to severe mitral  valve regurgitation.  6. Tricuspid valve regurgitation is mild to moderate.  7. The aortic valve is normal in structure. Aortic valve regurgitation is  mild.   Cath and PCI 07/26/19  Mid RCA lesion is 99% stenosed.  Post intervention, there is a 0% residual stenosis.  A drug-eluting stent was successfully placed using a STENT RESOLUTE ONYX 2.0X12. Conclusion Successful PCI and stent of nondominant mid right with DES stent 2.0 x 12 mm resolute to 12 atm Lesion reduced from 99 down to 0% TIMI-3 flow before and after procedure  Cath  07/26/19  Mid RCA lesion is 99% stenosed. Assessment The patient has had an acute non-ST elevation myocardial infarction with causes and risk factors including high blood pressure and high cholesterol. Left ventricular angiogram shows abnormal LV systolic function ejection fraction of 40% there is akinesis of the basal inferior and hypokinesis of the mid inferior wall all other myocardial walls are normal severe 1 vessel coronary artery disease likely causing his non-ST elevation myocardial infarction There is significant coronary artery disease and or stenosis involving right coronary artery of 99% Plan PCI and stent placement of right  coronary artery Dual antiplatelet therapy Hypertension control with beta-blocker Rehabilitation  Laboratory Data:  High Sensitivity Troponin:   Recent Labs   Lab 01/09/20 0152 01/16/20 0245 01/16/20 0702 01/16/20 0838 01/19/20 0433  TROPONINIHS 89* 934* 711* 674* 234*     Chemistry Recent Labs  Lab 01/19/20 0554 01/19/20 1316 01/20/20 0433  NA 136 142 137  K 6.7* 4.4 4.0  CL 106 106 106  CO2 17* 25 22  GLUCOSE 219* 195* 317*  BUN 23 24* 24*  CREATININE 1.08 1.18 1.09  CALCIUM 8.8* 8.8* 8.1*  GFRNONAA >60 >60 >60  ANIONGAP _0 Recent Labs  Lab 01/15/20 0445 01/16/20 1900  PROT 7.0 8.3*  ALBUMIN 2.6* 3.2*  AST 31 29  ALT 16 13  ALKPHOS 66 77  BILITOT 1.2 1.1   Hematology Recent Labs  Lab 01/18/20 0443 01/19/20 0419 01/20/20 0433  WBC 19.4* 14.8* 21.0*  RBC 8.03* 7.33* 7.32*  HGB 16.5 14.9 15.0  HCT 54.7* 49.3 50.1  MCV 68.1* 67.3* 68.4*  MCH 20.5* 20.3* 20.5*  MCHC 30.2 30.2 29.9*  RDW 25.5* 25.7* 25.3*  PLT 1,211* 1,143* 932*   BNPNo results for input(s): BNP, PROBNP in the last 168 hours.  DDimer No results for input(s): DDIMER in the last 168 hours.   Radiology/Studies:  DG Abd 1 View  Result Date: 01/19/2020 CLINICAL DATA:  ET tube and NG tube placement. EXAM: ABDOMEN - 1 VIEW COMPARISON:  None. FINDINGS: No NG tube identified over the lower chest/abdomen. Chest x-ray performed at the same time showed NG tube looped upon itself in the hypopharynx/proximal esophagus. Nonspecific bowel gas pattern. Radiodensity overlying the right kidney may be a renal stone. IMPRESSION: 1. NG tube not visualized in the lower chest or upper abdomen. 2. Nonspecific bowel gas pattern. 3. Probable right renal stone. Electronically Signed   By: Misty Stanley M.D.   On: 01/19/2020 06:28   DG Chest Port 1 View  Result Date: 01/19/2020 CLINICAL DATA:  Intubation. EXAM: PORTABLE CHEST 1 VIEW COMPARISON:  January 19, 2020 FINDINGS: Endotracheal tube in satisfactory position. Shock pads overlie the chest. Postsurgical changes from cardiothoracic surgery. The cardiac silhouette is stable enlarged. Streaky airspace opacities  in bilateral lung bases. No evidence of pneumothorax. Known mixed density lesion in the right proximal humerus. Soft tissues are grossly normal. IMPRESSION: 1. Endotracheal tube in satisfactory position. 2. Streaky airspace opacities in bilateral lung bases may represent atelectasis or infectious airspace consolidation. Electronically Signed   By: Fidela Salisbury M.D.   On: 01/19/2020 10:27   DG Chest Port 1 View  Result Date: 01/19/2020 CLINICAL DATA:  OG tube placement. EXAM: PORTABLE CHEST 1 VIEW COMPARISON:  Earlier same day FINDINGS: 0820 hours. Leftward patient rotation. The cardio pericardial silhouette is enlarged. NG tube/OG tube is positioned in the proximal stomach with proximal side port just above the expected location of the esophagogastric junction. IMPRESSION: NG tube tip projects over the proximal stomach. Electronically Signed   By: Misty Stanley M.D.   On: 01/19/2020 09:21   DG Chest Port 1 View  Result Date: 01/19/2020 CLINICAL DATA:  Respiratory failure. EXAM: PORTABLE CHEST 1 VIEW COMPARISON:  01/11/2020 FINDINGS: Endotracheal tube tip is 10.9 cm above the base of the carina, positioned at the sternal notch. NG tube appears to be looped upon itself in the hypopharynx/upper esophagus with the tip above the superior margin of the film. Right PICC line tip overlies the mid SVC level. The cardio pericardial silhouette  is enlarged. Interstitial markings are diffusely coarsened with chronic features. There is pulmonary vascular congestion without overt pulmonary edema. Telemetry leads overlie the chest. IMPRESSION: 1. Endotracheal tube tip is 10.9 cm above the base of the carina, positioned at the sternal notch. This could likely be advance 4-5 cm for more appropriate positioning. 2. NG tube appears to be looped upon itself in the hypopharynx/upper esophagus with the tip above the superior margin of the film. Repositioning recommended. 3. Cardiomegaly with chronic interstitial changes.  These results will be called to the ordering clinician or representative by the Radiologist Assistant, and communication documented in the PACS or [employer]. Electronically Signed   By: Misty Stanley M.D.   On: 01/19/2020 06:27     Assessment and Plan:   1. Tachyarrhythmias: Since admission, patient has had SVT and WCT, suspected as torsades.  He has required early admission DCCV and recent synchronized defibrillation x3.  IV amiodarone has been discontinued and he has been maintained on IV lidocaine.  Electrolytes have been maintained with IV potassium for hypokalemia.  Maintaining NSR, rate controlled.    Continue IV lidocaine.  IV amiodarone discontinued due to torsades.  Could consider transition to oral mexiletine once stabilized/at discharge and once able to tolerate oral medication.  Maintain potassium goal 4.0, magnesium goal 2.0. Current K/Mg at goal.   Wean pressors as tolerated.  Daily BMET. Cr 1.18  1.09, BUN 24.  Recommend evaluate for AICD placement for primary prevention once felt stable enough for intervention.  At this time, recommend continue to defer and maintain on medical therapy.  2. Acute on chronic systolic heart failure  LVEF <20%.  Most recent echocardiogram as above.  Euvolemic on exam this AM.  Not currently on IV diuresis and euvolemic. IV diuresis as needed.   I/Os, daily weights. Net +1030cc., Wt 68.7kg  67.8kg  Daily BMET Cr 1.09, BUN 24.  Currently on pressors, critically ill.   Wean pressors as tolerated.  Recommend evaluate for AICD placement for primary prevention once felt stable enough to do so.  Started on [place]. Continue as tolerated. Cr stable. K stable.  Continue escalation of GDMT with spironolactone as tolerated.    3. Coronary artery disease s/p non-STEMI s/p PCI/DES to mid RCA (2019)  High-sensitivity troponin minimally elevated and flat trending, thought 2/2 supply demand ischemia in the setting of his comorbid  conditions including tachyarrhythmias, respiratory failure s/p COVID-19 infection, and acute on chronic systolic heart failure as above.  Further ischemic work-up deferred in the setting of his critical illness.  He is not an ideal catheterization candidate at this time.  Continue to defer until hemodynamically stable and improvement in mental status.  Wean pressors as tolerated.  Escalation of GDMT recommended as tolerated.  4. Paroxysmal atrial fibrillation  Currently rate controlled.   Continue BB as tolerated by BP.  Wean pressors as tolerated.  Continue anticoagulation as H&H tolerates. Currently H&H stable.  Holding amiodarone due to earlier torsades. Continue IV lidocaine for now.  5. Aortic stenosis s/p AVR  Previous echo as above. History of aortic insufficiency as above on most recent echo.  Continue conservative management.  6. Leukocytosis  WBC remains elevated at 21.0.  Per critical care.  7. COVID-19 infection, positive 11/2019  History of COVID-19 infection.  8. Tobacco and marijuana use  Recommend complete cessation.  Nicotine patch as needed.      TIMI Risk Score for Unstable Angina or Non-ST Elevation MI:   The patient's TIMI risk  score is 6, which indicates a 41% risk of all cause mortality, new or recurrent myocardial infarction or need for urgent revascularization in the next 14 days.      CHA2DS2-VASc Score = 5  This indicates a 7.2% annual risk of stroke. The patient's score is based upon: CHF History: 1 HTN History: 1 Diabetes History: 1 Stroke History: 0 Vascular Disease History: 1 Age Score: 1 Gender Score: 0   NYHA Class IV sx     For questions or updates, please contact [place] Please consult [email or web address] for contact info under    Signed, Arvil Chaco, PA-C  01/20/2020 9:46 AM

## 2020-01-20 NOTE — Progress Notes (Addendum)
Nutrition Follow-up  DOCUMENTATION CODES:   Non-severe (moderate) malnutrition in context of chronic illness  INTERVENTION:   Vital AF 1.2 _0 /hr- Initiate at 63m/hr and advance by 151mhr q 12 hours until goal rate is reached.   Pro-Source TF 4566maily via tube   Free water flushes 72m80m hours to maintain tube patency   Propofol: 6.18 ml/hr- provides 163kcal/day   Regimen provides 1931kcal/day, 119g/day protein and 1348ml1m free water   NUTRITION DIAGNOSIS:   Moderate Malnutrition related to chronic illness (CHF) as evidenced by moderate fat depletion, moderate muscle depletion. Ongoing.  GOAL:   Patient will meet greater than or equal to 90% of their needs -not met   MONITOR:   Vent status, Labs, Weight trends, TF tolerance, I & O's  ASSESSMENT:   69 ye66 old male with PMHx of HTN, DM, gout, hx AVR with bioprosthetic valve, GERD, CAD, CHF (EF 20%), recent COVID-19 11/30/2019 admitted with respiratory failure, severe metabolic acidosis, CHF exacerbation.   Pt re-intubated 11/14. OGT in place with proximal side port just above the expected location of the esophagogastric junction per radiology report; would recommend advancing tube ~5cm prior to initiating tube feeds. Pt with fairly poor appetite and oral intake prior to intubation. Palliative care consult pending.    Per chart, pt down ~25lbs since admit but appears to be back near his UBW.   Medications reviewed and include: allopurinol, aspirin, colace, insulin, miralax, senokot, pepcid, fentanyl, phenylephrine, propofol  Labs reviewed: BUN 24(H), K 4.0 wnl, P 3.3 wnl, Mg 2.0 wnl Wbc- 21.0(H) cbgs- 146, 150, 221 x 24 hrs  Patient is currently intubated on ventilator support MV: 11.0 L/min Temp (24hrs), Avg:99.6 F (37.6 C), Min:98.8 F (37.1 C), Max:100.9 F (38.3 C)  Propofol: 6.18 ml/hr- provides 163kcal/day   MAP- >65mmH20mOP- 550ml  14m Order:   Diet Order            Diet NPO time  specified  Diet effective now                EDUCATION NEEDS:   No education needs have been identified at this time  Skin:  Skin Assessment: Reviewed RN Assessment  Last BM:  11/11- TYPE 5  Height:   Ht Readings from Last 1 Encounters:  01/06/20 _1  (1.702 m)   Weight:   Wt Readings from Last 1 Encounters:  01/20/20 67.8 kg   Ideal Body Weight:  67.3 kg  BMI:  Body mass index is 23.41 kg/m.  Estimated Nutritional Needs:   Kcal:  1884kcal/day  Protein:  100-115g/day  Fluid:  1.5-1.8 L/day or per MD  Travis Maxwell CKoleen Distance, LDN Please refer to [facility] and/or RD on-call/weekend/after hours pager

## 2020-01-21 ENCOUNTER — Inpatient Hospital Stay (HOSPITAL_COMMUNITY)
Admit: 2020-01-21 | Discharge: 2020-01-21 | Disposition: A | Discharge status: Home / Self Care | Payer: Medicare HMO | Attending: Internal Medicine | Admitting: Internal Medicine

## 2020-01-21 ENCOUNTER — Ambulatory Visit: Payer: Medicare HMO | Admitting: Family

## 2020-01-21 DIAGNOSIS — I340 Nonrheumatic mitral (valve) insufficiency: Secondary | ICD-10-CM

## 2020-01-21 DIAGNOSIS — I480 Paroxysmal atrial fibrillation: Secondary | ICD-10-CM | POA: Diagnosis not present

## 2020-01-21 DIAGNOSIS — D72829 Elevated white blood cell count, unspecified: Secondary | ICD-10-CM

## 2020-01-21 DIAGNOSIS — I959 Hypotension, unspecified: Secondary | ICD-10-CM

## 2020-01-21 DIAGNOSIS — I5022 Chronic systolic (congestive) heart failure: Secondary | ICD-10-CM | POA: Diagnosis not present

## 2020-01-21 DIAGNOSIS — I5023 Acute on chronic systolic (congestive) heart failure: Secondary | ICD-10-CM | POA: Diagnosis not present

## 2020-01-21 DIAGNOSIS — I472 Ventricular tachycardia: Secondary | ICD-10-CM

## 2020-01-21 DIAGNOSIS — L8990 Pressure ulcer of unspecified site, unspecified stage: Secondary | ICD-10-CM | POA: Insufficient documentation

## 2020-01-21 DIAGNOSIS — R627 Adult failure to thrive: Secondary | ICD-10-CM | POA: Diagnosis not present

## 2020-01-21 LAB — BASIC METABOLIC PANEL
Anion gap: 8 (ref 5–15)
BUN: 29 mg/dL — ABNORMAL HIGH (ref 8–23)
CO2: 24 mmol/L (ref 22–32)
Calcium: 8.7 mg/dL — ABNORMAL LOW (ref 8.9–10.3)
Chloride: 107 mmol/L (ref 98–111)
Creatinine, Ser: 1.07 mg/dL (ref 0.61–1.24)
GFR, Estimated: >60 mL/min (ref >60)
Glucose, Bld: 202 mg/dL — ABNORMAL HIGH (ref 70–99)
Potassium: 4.4 mmol/L (ref 3.5–5.1)
Sodium: 139 mmol/L (ref 135–145)

## 2020-01-21 LAB — CBC WITH DIFFERENTIAL/PLATELET
Abs Immature Granulocytes: 0.3 10*3/uL — ABNORMAL HIGH (ref 0.00–0.07)
Basophils Absolute: 0.2 10*3/uL — ABNORMAL HIGH (ref 0.0–0.1)
Basophils Relative: 1 %
Eosinophils Absolute: 0.7 10*3/uL — ABNORMAL HIGH (ref 0.0–0.5)
Eosinophils Relative: 3 %
HCT: 52.7 % — ABNORMAL HIGH (ref 39.0–52.0)
Hemoglobin: 15.6 g/dL (ref 13.0–17.0)
Immature Granulocytes: 2 %
Lymphocytes Relative: 8 %
Lymphs Abs: 1.7 10*3/uL (ref 0.7–4.0)
MCH: 20.6 pg — ABNORMAL LOW (ref 26.0–34.0)
MCHC: 29.6 g/dL — ABNORMAL LOW (ref 30.0–36.0)
MCV: 69.7 fL — ABNORMAL LOW (ref 80.0–100.0)
Monocytes Absolute: 2.5 10*3/uL — ABNORMAL HIGH (ref 0.1–1.0)
Monocytes Relative: 12 %
Neutro Abs: 15.3 10*3/uL — ABNORMAL HIGH (ref 1.7–7.7)
Neutrophils Relative %: 74 %
Platelets: 876 10*3/uL — ABNORMAL HIGH (ref 150–400)
RBC: 7.56 MIL/uL — ABNORMAL HIGH (ref 4.22–5.81)
RDW: 26 % — ABNORMAL HIGH (ref 11.5–15.5)
Smear Review: NORMAL
WBC: 20.7 10*3/uL — ABNORMAL HIGH (ref 4.0–10.5)
nRBC: 0 % (ref 0.0–0.2)

## 2020-01-21 LAB — BLOOD GAS, VENOUS
Acid-base deficit: 5.4 mmol/L — ABNORMAL HIGH (ref 0.0–2.0)
Bicarbonate: 22.4 mmol/L (ref 20.0–28.0)
FIO2: 0.4
Hi Frequency JET Vent Rate: 20
MECHVT: 450 mL
O2 Saturation: 80.8 %
PEEP: 5 cmH2O
Patient temperature: 37
pCO2, Ven: 51 mmHg (ref 44.0–60.0)
pH, Ven: 7.25 (ref 7.250–7.430)
pO2, Ven: 53 mmHg — ABNORMAL HIGH (ref 32.0–45.0)

## 2020-01-21 LAB — PHOSPHORUS: Phosphorus: 2.6 mg/dL (ref 2.5–4.6)

## 2020-01-21 LAB — TRIGLYCERIDES: Triglycerides: 59 mg/dL (ref <150)

## 2020-01-21 LAB — GLUCOSE, CAPILLARY
Glucose-Capillary: 215 mg/dL — ABNORMAL HIGH (ref 70–99)
Glucose-Capillary: 175 mg/dL — ABNORMAL HIGH (ref 70–99)
Glucose-Capillary: 196 mg/dL — ABNORMAL HIGH (ref 70–99)
Glucose-Capillary: 249 mg/dL — ABNORMAL HIGH (ref 70–99)
Glucose-Capillary: 228 mg/dL — ABNORMAL HIGH (ref 70–99)
Glucose-Capillary: 184 mg/dL — ABNORMAL HIGH (ref 70–99)

## 2020-01-21 LAB — MAGNESIUM
Magnesium: 1.9 mg/dL (ref 1.7–2.4)
Magnesium: 2 mg/dL (ref 1.7–2.4)

## 2020-01-21 LAB — LIDOCAINE LEVEL: Lidocaine Lvl: >30 ug/mL — ABNORMAL HIGH (ref 1.5–5.0)

## 2020-01-21 MED ORDER — HEPARIN (PORCINE) 25000 UT/250ML-% IV SOLN
1050.0000 [IU]/h | INTRAVENOUS | Status: DC
  Administered 2020-01-21: 950 [IU]/h via INTRAVENOUS
  Filled 2020-01-21: qty 250

## 2020-01-21 MED ORDER — MAGNESIUM SULFATE 2 GM/50ML IV SOLN
2.0000 g | Freq: Once | INTRAVENOUS | Status: AC
  Administered 2020-01-21: 2 g via INTRAVENOUS
  Filled 2020-01-21: qty 50

## 2020-01-21 MED ORDER — SODIUM CHLORIDE 0.9 % IV SOLN
2.0000 g | Freq: Three times a day (TID) | INTRAVENOUS | Status: AC
  Administered 2020-01-21 – 2020-01-28 (×21): 2 g via INTRAVENOUS
  Filled 2020-01-21 (×22): qty 2

## 2020-01-21 MED ORDER — MIDAZOLAM 50MG/50ML (1MG/ML) PREMIX INFUSION
0.5000 mg/h | INTRAVENOUS | Status: DC
  Administered 2020-01-21: 0.5 mg/h via INTRAVENOUS
  Administered 2020-01-21 – 2020-01-22 (×3): 4 mg/h via INTRAVENOUS
  Administered 2020-01-23 – 2020-01-24 (×4): 5 mg/h via INTRAVENOUS
  Administered 2020-01-25: 4 mg/h via INTRAVENOUS
  Filled 2020-01-21 (×10): qty 50

## 2020-01-21 MED ORDER — FUROSEMIDE 10 MG/ML IJ SOLN
20.0000 mg | Freq: Every day | INTRAMUSCULAR | Status: DC
  Administered 2020-01-21 – 2020-02-06 (×16): 20 mg via INTRAVENOUS
  Filled 2020-01-21 (×16): qty 2

## 2020-01-21 NOTE — Progress Notes (Addendum)
Notified by care RN Irving Burton that the patient has been having more ectopy: witnessed bigeminy and increased PVC's.  P: - increased Lidocaine drip increased back to 4mg  - discussed case with , [facility] PA who discussed with Dr. Leafy Kindle P: - discontinuing entresto - discontinuing propofol - ordered versed drip for sedation, titrate as needed - Dr. Okey Dupre spoke with Dr. Lalla Brothers suggesting amiodarone drip without bolus for increased ventricular arrhythmias moving forward.  Plan discussed with care RN, Belia Heman Will continue to monitor patient closely  Irving Burton [place], AGACNP-BC Acute Care Nurse Practitioner [facility]   [phone] / [phone] Please see Amion for pager details.

## 2020-01-21 NOTE — Progress Notes (Signed)
CRITICAL CARE NOTE [name] presenting to the ED on 01/06/2020 in respiratory distress, complaining of chest pain and shortness of breath. He also reported orthopnea and increased lower extremity swelling. History was limited due to acuity of condition and respiratory status. Patient was placed on biPAP, but progressively worsened and was emergently intubated. Patient is a current 0.5ppd smoker and uses marijuana. He has a severe metabolic acidosis. Patient has a past medical history significant for HTN, T2DM, HLD, GERD, CAD (s/p PCI), AVR with bioprosthetic valve, systolic CHF with EF of 09%, and paroxysmal A. Fib, currently anticoagulated on Eliquis. EKG upon arrival to ED revealed sinus rhythm at a rate of 99bpm with RBBB and PVCs with ST elevation in II, III, avF, V4-V6. Cardiologist on call stated that STEMI criteria were not met and advised to manage CHF. Patient received IV diltiazem and reported improvement of symptoms. Labs were notable for high sensitivity troponin 71->91->89. BNP 1046, creatinine 1.26, BUN 21, potassium 5.4. CXR showed improved pulmonary edema since prior study with chronic pulmonary venous hypertension. Notably, patient was admitted to [facility] ED 12/04/2019 for acute respiratory failure and acute on chronic systolic CHF in the setting of COVID pneumonia.  SIGNIFICANT EVENTS 11/02 - severe cardiogenic shock, remains on vent 11/03 - severe respiratory failure, severe systolic CHF 32/35 - patient remains critically ill 11/05 - patient remains critically ill 11/06 - off pressors, extubated to biPAP 11/07 - remains extubated, febrile, MSSA+ tracheal culture - started ancef 11/08 - remains extubated, on 2L by nasal cannula. Afebrile today with white count of 21.3K, up from 15.7K yesterday. Still encephalopathic and drowsy. On and off Precedex. Went into narrow complex SVT,then progressed to wide complex tachycardiawas shocked and given a loading dose of amiodarone. Will remain on  amiodarone gtt.Cardiology at bedside.  11/09 - remains extubated, on 2L by nasal cannula. Afebrile today with white count of 19.2K, down since yesterday. BP soft overnight, was started on Neo-synephrine infusion. More alert than yesterday, but only oriented to self. Failed Yale 3oz swallow study early this morning, started coughing at the end of the study. Will need to remain NPO until cleared by SLP. Patient has been educated on this, yet continues to incessantly ask for water.  11/10 - patient clinically improved. He is alert communicative and in no distress. He ate entire meal without issues. Still has confusion intermittently during interview. Frequent PVCs on telemetry.  Will initiate OT/PT today for downgrade planning. 11/11 - patient minimally symptomatic, he again had episodes of Torsades today s/p 120Jx1 mag 2g.  Poor prognosis overall with advanced CHF.  11/12 - Dicussed case with cardiology. Antiarrythmic agents being modified. Will also call consult for Electrophysiology  11/13 - Patient with no arrythmia overnight. He remains on precedex and neosynephrine.  I spoke with cardiology Dr Rayann Heman regarding EP evaluation- he recommended to continue with current medical therapy and have cardiologist call to EP on Monday to review options.  He has 1:1 sitter and is resting in bed comfortably. Spoke with Velva Harman POA daughter to update on poor prognosis worsening clinical condition.  11/14 - further deterioration overnight.  Patient required reintubation and MV.  Updated next of kin daughter Velva Harman this am and sent message to cardiology to discuss poor prognosis with family.    11/15 - remains intubated and sedated, high risk for cardiac arrest and death 03-Feb-2023 - remains intubated and sedated, frequent PVCs overnight, 2g magnesium given, awaiting EP consult  CC  Follow up respiratory failure  SUBJECTIVE  Patient remains critically ill Prognosis is guarded Remains on pressors   BP 131/71   Pulse (!)  112   Temp 99.5 F (37.5 C)   Resp 13   Ht 5' 7"  (1.702 m)   Wt 67.8 kg   SpO2 97%   BMI 23.41 kg/m    I/O last 3 completed shifts: In: 2821.6 [I.V.:1921.6; NG/GT:850; IV Piggyback:50] Out: 1575 [Urine:1275; Emesis/NG output:300] Total I/O In: 61.3 [I.V.:61.3] Out: 125 [Urine:125]  SpO2: 97 % O2 Flow Rate (L/min): 2 L/min FiO2 (%): 40 %  Estimated body mass index is 23.41 kg/m as calculated from the following:   Height as of this encounter: 5' 7"  (1.702 m).   Weight as of this encounter: 67.8 kg.  SIGNIFICANT EVENTS   REVIEW OF SYSTEMS  PATIENT IS UNABLE TO PROVIDE COMPLETE REVIEW OF SYSTEMS DUE TO SEVERE CRITICAL ILLNESS   Pressure Injury 01/20/20 Sacrum Posterior;Medial Stage 2 -  Partial thickness loss of dermis presenting as a shallow open injury with a red, pink wound bed without slough. (Active)  01/20/20 0800  Location: Sacrum  Location Orientation: Posterior;Medial  Staging: Stage 2 -  Partial thickness loss of dermis presenting as a shallow open injury with a red, pink wound bed without slough.  Wound Description (Comments):   Present on Admission: No    PHYSICAL EXAMINATION:  GENERAL: Critically ill appearing, +resp distress HEAD: Normocephalic, atraumatic.  EYES: Pupils equal, round, reactive to light.  No scleral icterus.  MOUTH: Moist mucosal membrane. NECK: Supple.  PULMONARY: +rhonchi, +wheezing CARDIOVASCULAR: S1 and S2. Regular rate and rhythm. No murmurs, rubs, or gallops.  GASTROINTESTINAL: Soft, nontender, non-distended.  Positive bowel sounds.   MUSCULOSKELETAL: No swelling, clubbing, or edema.  NEUROLOGIC: Obtunded, GCS<8 SKIN: Intact, warm, dry  MEDICATIONS: I have reviewed all medications and confirmed regimen as documented Scheduled Meds: . allopurinol  300 mg Per Tube Daily  . apixaban  5 mg Per Tube BID  . aspirin  81 mg Per Tube Daily  . atorvastatin  40 mg Per Tube QHS  . chlorhexidine  15 mL Mouth Rinse BID  . chlorhexidine  gluconate (MEDLINE KIT)  15 mL Mouth Rinse BID  . Chlorhexidine Gluconate Cloth  6 each Topical Daily  . docusate  100 mg Per Tube BID  . feeding supplement (PROSource TF)  45 mL Per Tube Daily  . fentaNYL (SUBLIMAZE) injection  25 mcg Intravenous Once  . insulin aspart  0-9 Units Subcutaneous Q4H  . mouth rinse  15 mL Mouth Rinse 10 times per day  . metoprolol succinate  25 mg Oral Daily  . polyethylene glycol  17 g Per Tube Daily  . sacubitril-valsartan  1 tablet Per Tube BID  . senna-docusate  1 tablet Per Tube BID  . sodium chloride flush  10-40 mL Intracatheter Q12H  . sodium chloride flush  3 mL Intravenous Q12H   Continuous Infusions: . sodium chloride 10 mL/hr at 01/12/20 1915  . sodium chloride 250 mL (01/20/20 0907)  . famotidine (PEPCID) IV Stopped (01/20/20 3244)  . feeding supplement (VITAL AF 1.2 CAL) 50 mL/hr at 01/21/20 0715  . fentaNYL infusion INTRAVENOUS 125 mcg/hr (01/21/20 0900)  . lidocaine 2 mg/min (01/21/20 0715)  . phenylephrine (NEO-SYNEPHRINE) Adult infusion 60 mcg/min (01/21/20 0715)  . propofol (DIPRIVAN) infusion 20 mcg/kg/min (01/21/20 0915)   PRN Meds:.sodium chloride, acetaminophen, albuterol, diazepam, fentaNYL, haloperidol lactate, metoprolol tartrate, ondansetron (ZOFRAN) IV, oxyCODONE, polyethylene glycol, sodium chloride flush, sodium chloride flush   CULTURE RESULTS   Recent  Results (from the past 240 hour(s))  Culture, respiratory     Status: None (Preliminary result)   Collection Time: 01/19/20  3:55 PM   Specimen: Tracheal Aspirate; Respiratory  Result Value Ref Range Status   Specimen Description   Final    TRACHEAL ASPIRATE Performed at [facility], [address], [place], [place] [address]    Special Requests   Final    NONE Performed at [facility], [place], [place] [address]    Gram Stain   Final    MODERATE WBC PRESENT, PREDOMINANTLY PMN ABUNDANT GRAM POSITIVE COCCI IN PAIRS IN  CLUSTERS ABUNDANT GRAM NEGATIVE RODS    Culture   Final    CULTURE REINCUBATED FOR BETTER GROWTH Performed at [facility], [place] [address] [place], [place] [address]    Report Status PENDING  Incomplete        CMP Latest Ref Rng & Units 01/21/2020 01/20/2020 01/19/2020  Glucose 70 - 99 mg/dL 202(H) 317(H) 195(H)  BUN 8 - 23 mg/dL 29(H) 24(H) 24(H)  Creatinine 0.61 - 1.24 mg/dL 1.07 1.09 1.18  Sodium 135 - 145 mmol/L 139 137 142  Potassium 3.5 - 5.1 mmol/L 4.4 4.0 4.4  Chloride 98 - 111 mmol/L 107 106 106  CO2 22 - 32 mmol/L 24 22 25   Calcium 8.9 - 10.3 mg/dL 8.7(L) 8.1(L) 8.8(L)  Total Protein 6.5 - 8.1 g/dL - - -  Total Bilirubin 0.3 - 1.2 mg/dL - - -  Alkaline Phos 38 - 126 U/L - - -  AST 15 - 41 U/L - - -  ALT 0 - 44 U/L - - -     IMAGING    DG Abd 1 View  Result Date: 01/20/2020 CLINICAL DATA:  69 year old male status post NG placement. EXAM: ABDOMEN - 1 VIEW COMPARISON:  Abdominal radiograph dated 01/19/2020. FINDINGS: Enteric tube with tip and side-port in the left upper abdomen. The tip of the enteric tube is likely at the gastric fundus. Air is noted throughout the colon. Cardiomegaly with mechanical cardiac valve. IMPRESSION: Enteric tube with tip likely in the region of the gastric fundus. Electronically Signed   By: Anner Crete M.D.   On: 01/20/2020 15:57     Nutrition Status: Nutrition Problem: Moderate Malnutrition Etiology: chronic illness (CHF) Signs/Symptoms: moderate fat depletion, moderate muscle depletion Interventions: Tube feeding, MVI     Indwelling Urinary Catheter continued, requirement due to   Reason to continue Indwelling Urinary Catheter strict Intake/Output monitoring for hemodynamic instability   Central Line/ continued, requirement due to  Reason to continue [place] of central venous pressure or other hemodynamic parameters and poor IV access   Ventilator continued, requirement due to severe respiratory  failure   Ventilator Sedation RASS 0 to -2      ASSESSMENT AND PLAN SYNOPSIS [name] with severe respiratory failure due to metabolic acidosis with severe systolic CHF exacerbation, previous history of COVID-19 in the setting of drug abuse, undergoing therapy for HCAP, complicated by tachyarrhythmia and progressive heart failure. Patient with recurrent respiratory failure requiring intubation.  Severe ACUTE Hypoxic and Hypercapnic Respiratory Failure - Continue Full MV support - Continue Bronchodilator Therapy - Wean Fio2 and PEEP as tolerated - VAP/VENT bundle implementation - Patient will need trach and PEG tube to survive - Palliative care following  ACUTE SYSTOLIC CARDIAC FAILURE - EF 20% - Oxygen as needed - Lasix as tolerated - Follow up cardiac enzymes as indicated - Follow  up cardiology recs - Second opinion from [facility] pending --> awaiting EP consult  ACUTE KIDNEY INJURY/Renal Failure - Continue Foley Catheter-assess need - Avoid nephrotoxic agents - Follow urine output, BMP - Ensure adequate renal perfusion, optimize oxygenation - Renal dose medications  NEUROLOGY - Acute toxic metabolic encephalopathy, need for sedation - Goal RASS -2 to -3  SHOCK-CARDIOGENIC due to tachyarrhythmias - S/p electrical cardioversion - Use vasopressors to keep MAP>65 - IV amiodarone discontinued due to torsades - Continue IV lidocaine  CARDIAC - ICU monitoring - Telemetry showing sinus rhythm with PVCs  GI - GI PROPHYLAXIS as indicated - DIET --> TF's as tolerated - Constipation protocol as indicated  NUTRITIONAL STATUS Nutrition Status: Nutrition Problem: Moderate Malnutrition Etiology: chronic illness (CHF) Signs/Symptoms: moderate fat depletion, moderate muscle depletion Interventions: Tube feeding, MVI  ENDO - Will use ICU hypoglycemic\Hyperglycemia protocol if indicated  ELECTROLYTES - Follow labs as needed - Replace as needed - Pharmacy consultation and  following   DVT/GI PRX ordered and assessed TRANSFUSIONS AS NEEDED MONITOR FSBS I Assessed the need for Labs I Assessed the need for Foley I Assessed the need for Central Venous Line Family Discussion when available I Assessed the need for Mobilization I made an Assessment of medications to be adjusted accordingly Safety Risk assessment completed   CASE DISCUSSED IN MULTIDISCIPLINARY ROUNDS WITH ICU TEAM  Critical Care Time devoted to patient care services described in this note is 45 minutes.   Overall, patient is critically ill, prognosis is guarded.  Patient with Multiorgan failure and at high risk for cardiac arrest and death.    Corrin Parker, M.D.  [facility]  Medical Director [place] Director [facility] Department

## 2020-01-21 NOTE — Consult Note (Signed)
Electrophysiology Consultation:   Patient ID: Travis Maxwell MRN: [id number]; DOB: Jun 19, 1950  Admit date: 01/06/2020 Date of Consult: 01/21/2020  Primary Care Provider: [facility] [place] Cardiologist: No primary care provider on file.  [facility] Electrophysiologist:  None    Patient Profile:   Travis Maxwell is a 69 y.o. male with a hx of CAD, chronic systolic heart failure, ventricular tachycardia, aortic insufficiency and stenosis post AVR with Edwards tissue valve on March 15, 2018, coronary artery disease post drug-eluting stent to the mid right coronary in May 2019, paroxysmal atrial fibrillation previously on amiodarone and Eliquis, hypertension, hyperlipidemia, tobacco use, marijuana use, diabetes, gout.  I am seeing him today for an evaluation of ventricular tachycardia at the request of Dr. Fletcher Anon.  History of Present Illness:   Travis Maxwell was initially admitted to [facility] on September 29 for respiratory failure in the setting of COVID-19 pneumonia.  Echo prior to that admission showed an ejection fraction less than 20% with moderate to severe MR and TR.  He represented to [facility] on November 1 with ST elevation in leads II, III, aVF in the lateral precordium.  He was intubated for respiratory failure.  His troponins remained stable despite the EKG abnormality.  He was extubated on November 6.  On November 8 he had a narrow complex tachycardia by report the transition to a wide-complex tachycardia and so he was started on an amiodarone drip.  He was hypokalemic at that time.  On November 11 he had 2 episodes of ventricular tachycardia requiring CPR and defibrillation.  There was concern that the rhythm was torsade and thus the amiodarone was discontinued.  He was transition to lidocaine.  He has been previously seen by [facility] cardiology until recently when the family requested a second opinion from the [facility] group.  My colleague,  Dr. Fletcher Anon, saw the patient yesterday.   Past Medical History:  Diagnosis Date  . CAD (coronary artery disease)   . Chronic systolic (congestive) heart failure ([place])   . Diabetes mellitus without complication ([place])   . GERD (gastroesophageal reflux disease)   . Gout   . Hx of aortic valve replacement    Bioprosthetic valve  . Hypertension     Past Surgical History:  Procedure Laterality Date  . CORONARY STENT INTERVENTION N/A 07/25/2017   Procedure: CORONARY STENT INTERVENTION;  Surgeon: Yolonda Kida, MD;  Location: [place] [facility];  Service: Cardiovascular;  Laterality: N/A;  . LEFT HEART CATH AND CORONARY ANGIOGRAPHY N/A 07/25/2017   Procedure: LEFT HEART CATH AND CORONARY ANGIOGRAPHY;  Surgeon: Corey Skains, MD;  Location: [place] [facility];  Service: Cardiovascular;  Laterality: N/A;     Inpatient Medications: Scheduled Meds: . allopurinol  300 mg Per Tube Daily  . apixaban  5 mg Per Tube BID  . aspirin  81 mg Per Tube Daily  . atorvastatin  40 mg Per Tube QHS  . chlorhexidine  15 mL Mouth Rinse BID  . chlorhexidine gluconate (MEDLINE KIT)  15 mL Mouth Rinse BID  . Chlorhexidine Gluconate Cloth  6 each Topical Daily  . docusate  100 mg Per Tube BID  . feeding supplement (PROSource TF)  45 mL Per Tube Daily  . fentaNYL (SUBLIMAZE) injection  25 mcg Intravenous Once  . insulin aspart  0-9 Units Subcutaneous Q4H  . mouth rinse  15 mL Mouth Rinse 10 times per day  . metoprolol succinate  25 mg Oral Daily  . polyethylene glycol  17 g Per Tube Daily  . sacubitril-valsartan  1 tablet Per Tube BID  . senna-docusate  1 tablet Per Tube BID  . sodium chloride flush  10-40 mL Intracatheter Q12H  . sodium chloride flush  3 mL Intravenous Q12H   Continuous Infusions: . sodium chloride 10 mL/hr at 01/12/20 1915  . sodium chloride 250 mL (01/20/20 0907)  . famotidine (PEPCID) IV Stopped (01/20/20 5638)  . feeding supplement (VITAL AF 1.2 CAL) 50 mL/hr at 01/21/20  0715  . fentaNYL infusion INTRAVENOUS 100 mcg/hr (01/21/20 0715)  . lidocaine 2 mg/min (01/21/20 0715)  . phenylephrine (NEO-SYNEPHRINE) Adult infusion 60 mcg/min (01/21/20 0715)  . propofol (DIPRIVAN) infusion 10 mcg/kg/min (01/21/20 0429)   PRN Meds: sodium chloride, acetaminophen, albuterol, diazepam, fentaNYL, haloperidol lactate, metoprolol tartrate, ondansetron (ZOFRAN) IV, oxyCODONE, polyethylene glycol, sodium chloride flush, sodium chloride flush  Allergies:    Allergies  Allergen Reactions  . Penicillins Other (See Comments)    Has patient had a PCN reaction causing immediate rash, facial/tongue/throat swelling, SOB or lightheadedness with hypotension: Unknown Has patient had a PCN reaction causing severe rash involving mucus membranes or skin necrosis: Unknown Has patient had a PCN reaction that required hospitalization: Unknown Has patient had a PCN reaction occurring within the last 10 years: No If all of the above answers are "NO", then may proceed with Cephalosporin use.     Social History:   Social History   Socioeconomic History  . Marital status: Divorced    Spouse name: Not on file  . Number of children: Not on file  . Years of education: Not on file  . Highest education level: Not on file  Occupational History  . Not on file  Tobacco Use  . Smoking status: Current Every Day Smoker    Packs/day: 0.50    Types: Cigarettes    Last attempt to quit: 08/10/2017    Years since quitting: 2.4  . Smokeless tobacco: Never Used  Vaping Use  . Vaping Use: Never used  Substance and Sexual Activity  . Alcohol use: Not Currently  . Drug use: Yes    Types: Marijuana    Comment: today  . Sexual activity: Not on file  Other Topics Concern  . Not on file  Social History Narrative  . Not on file   Social Determinants of Health   Financial Resource Strain:   . Difficulty of Paying Living Expenses: Not on file  Food Insecurity:   . Worried About Charity fundraiser  in the Last Year: Not on file  . Ran Out of Food in the Last Year: Not on file  Transportation Needs:   . Lack of Transportation (Medical): Not on file  . Lack of Transportation (Non-Medical): Not on file  Physical Activity:   . Days of Exercise per Week: Not on file  . Minutes of Exercise per Session: Not on file  Stress:   . Feeling of Stress : Not on file  Social Connections:   . Frequency of Communication with Friends and Family: Not on file  . Frequency of Social Gatherings with Friends and Family: Not on file  . Attends Religious Services: Not on file  . Active Member of Clubs or Organizations: Not on file  . Attends Archivist Meetings: Not on file  . Marital Status: Not on file  Intimate Partner Violence:   . Fear of Current or Ex-Partner: Not on file  . Emotionally Abused: Not on file  . Physically Abused: Not  on file  . Sexually Abused: Not on file    Family History:    Family History  Problem Relation Age of Onset  . CAD Mother   . CAD Father      ROS:  Please see the history of present illness.   All other ROS reviewed and negative.     Physical Exam/Data:   Vitals:   01/21/20 0500 01/21/20 0600 01/21/20 0700 01/21/20 0715  BP: 94/62 (!) 129/97 123/75 121/77  Pulse: (!) 108 (!) 110 (!) 109 (!) 107  Resp: 12 13 14 19   Temp: 98.8 F (37.1 C) 99 F (37.2 C) 99 F (37.2 C) 99.3 F (37.4 C)  TempSrc:    Rectal  SpO2: 95% 92% 97% 97%  Weight:      Height:        Intake/Output Summary (Last 24 hours) at 01/21/2020 0820 Last data filed at 01/21/2020 0715 Gross per 24 hour  Intake 2071 ml  Output 1325 ml  Net 746 ml   Last 3 Weights 01/20/2020 01/19/2020 01/18/2020  Weight (lbs) 149 lb 7.6 oz 151 lb 7.3 oz 153 lb  Weight (kg) 67.8 kg 68.7 kg 69.4 kg     Body mass index is 23.41 kg/m.    General: Intubated and sedated HEENT: normal Lymph: no adenopathy Neck: Intubated and sedated, difficult to assess Endocrine:  No  thryomegaly Vascular: Warm extremities Cardiac:  normal S1, S2; RRR; soft systolic murmur at the upper sternal borders Lungs: Mechanically ventilated.  Coarse breath sounds bilaterally. Abd: soft, no hepatomegaly  Ext: no edema Musculoskeletal:  No deformities Skin: warm and dry  Neuro: Sedated Psych: Sedated  Telemetry:  Telemetry was personally reviewed and demonstrates: I reviewed every telemetry strip I can find from this admission.  Review of telemetry shows a monomorphic ventricular tachycardia at various rates and frequent ventricular ectopy.  I do not see any strips demonstrating torsades.  Relevant CV Studies:  01/09/2020 ECG personally reviewed  Sinus, RBBB, multiple PVCs/NSVT   August 13, 2019 echo personally reviewed Left ventricular function severely decreased, less than 20%.  Left ventricle severely dilated Right ventricular function normal Moderate to severe mitral regurgitation secondary to annular dilation Moderate tricuspid regurgitation  Laboratory Data:  High Sensitivity Troponin:   Recent Labs  Lab 01/09/20 0152 01/16/20 0245 01/16/20 0702 01/16/20 0838 01/19/20 0433  TROPONINIHS 89* 934* 711* 674* 234*     Chemistry Recent Labs  Lab 01/19/20 1316 01/20/20 0433 01/21/20 0409  NA 142 137 139  K 4.4 4.0 4.4  CL 106 106 107  CO2 25 22 24   GLUCOSE 195* 317* 202*  BUN 24* 24* 29*  CREATININE 1.18 1.09 1.07  CALCIUM 8.8* 8.1* 8.7*  GFRNONAA >60 >60 >60  ANIONGAP 11 9 8     Recent Labs  Lab 01/15/20 0445 01/16/20 1900  PROT 7.0 8.3*  ALBUMIN 2.6* 3.2*  AST 31 29  ALT 16 13  ALKPHOS 66 77  BILITOT 1.2 1.1   Hematology Recent Labs  Lab 01/19/20 0419 01/20/20 0433 01/21/20 0409  WBC 14.8* 21.0* 20.7*  RBC 7.33* 7.32* 7.56*  HGB 14.9 15.0 15.6  HCT 49.3 50.1 52.7*  MCV 67.3* 68.4* 69.7*  MCH 20.3* 20.5* 20.6*  MCHC 30.2 29.9* 29.6*  RDW 25.7* 25.3* 26.0*  PLT 1,143* 932* 876*   BNPNo results for input(s): BNP, PROBNP in the last  168 hours.  DDimer No results for input(s): DDIMER in the last 168 hours.   Radiology/Studies:  DG Abd 1  View  Result Date: 01/20/2020 CLINICAL DATA:  69 year old male status post NG placement. EXAM: ABDOMEN - 1 VIEW COMPARISON:  Abdominal radiograph dated 01/19/2020. FINDINGS: Enteric tube with tip and side-port in the left upper abdomen. The tip of the enteric tube is likely at the gastric fundus. Air is noted throughout the colon. Cardiomegaly with mechanical cardiac valve. IMPRESSION: Enteric tube with tip likely in the region of the gastric fundus. Electronically Signed   By: Anner Crete M.D.   On: 01/20/2020 15:57   DG Abd 1 View  Result Date: 01/19/2020 CLINICAL DATA:  ET tube and NG tube placement. EXAM: ABDOMEN - 1 VIEW COMPARISON:  None. FINDINGS: No NG tube identified over the lower chest/abdomen. Chest x-ray performed at the same time showed NG tube looped upon itself in the hypopharynx/proximal esophagus. Nonspecific bowel gas pattern. Radiodensity overlying the right kidney may be a renal stone. IMPRESSION: 1. NG tube not visualized in the lower chest or upper abdomen. 2. Nonspecific bowel gas pattern. 3. Probable right renal stone. Electronically Signed   By: Misty Stanley M.D.   On: 01/19/2020 06:28   DG Chest Port 1 View  Result Date: 01/19/2020 CLINICAL DATA:  Intubation. EXAM: PORTABLE CHEST 1 VIEW COMPARISON:  January 19, 2020 FINDINGS: Endotracheal tube in satisfactory position. Shock pads overlie the chest. Postsurgical changes from cardiothoracic surgery. The cardiac silhouette is stable enlarged. Streaky airspace opacities in bilateral lung bases. No evidence of pneumothorax. Known mixed density lesion in the right proximal humerus. Soft tissues are grossly normal. IMPRESSION: 1. Endotracheal tube in satisfactory position. 2. Streaky airspace opacities in bilateral lung bases may represent atelectasis or infectious airspace consolidation. Electronically Signed   By:  Fidela Salisbury M.D.   On: 01/19/2020 10:27   DG Chest Port 1 View  Result Date: 01/19/2020 CLINICAL DATA:  OG tube placement. EXAM: PORTABLE CHEST 1 VIEW COMPARISON:  Earlier same day FINDINGS: 0820 hours. Leftward patient rotation. The cardio pericardial silhouette is enlarged. NG tube/OG tube is positioned in the proximal stomach with proximal side port just above the expected location of the esophagogastric junction. IMPRESSION: NG tube tip projects over the proximal stomach. Electronically Signed   By: Misty Stanley M.D.   On: 01/19/2020 09:21   DG Chest Port 1 View  Result Date: 01/19/2020 CLINICAL DATA:  Respiratory failure. EXAM: PORTABLE CHEST 1 VIEW COMPARISON:  01/11/2020 FINDINGS: Endotracheal tube tip is 10.9 cm above the base of the carina, positioned at the sternal notch. NG tube appears to be looped upon itself in the hypopharynx/upper esophagus with the tip above the superior margin of the film. Right PICC line tip overlies the mid SVC level. The cardio pericardial silhouette is enlarged. Interstitial markings are diffusely coarsened with chronic features. There is pulmonary vascular congestion without overt pulmonary edema. Telemetry leads overlie the chest. IMPRESSION: 1. Endotracheal tube tip is 10.9 cm above the base of the carina, positioned at the sternal notch. This could likely be advance 4-5 cm for more appropriate positioning. 2. NG tube appears to be looped upon itself in the hypopharynx/upper esophagus with the tip above the superior margin of the film. Repositioning recommended. 3. Cardiomegaly with chronic interstitial changes. These results will be called to the ordering clinician or representative by the Radiologist Assistant, and communication documented in the PACS or [employer]. Electronically Signed   By: Misty Stanley M.D.   On: 01/19/2020 06:27     Assessment and Plan:  Travis Maxwell is a 69 year old man with a  history of coronary artery disease and severe  chronic systolic heart failure likely due to a nonischemic cause.  His cardiomyopathy is complicated by ventricular tachycardia.  [facility] is been asked to weigh in regarding his ongoing systolic heart failure and ventricular tachycardias.    1. Ventricular tachycardia  Patient with significant electrical instability during this admission with sustained ventricular tachycardia, VT arrest, and frequent ventricular ectopy.  At the time of my evaluation, the patient is on lidocaine at 2 mg/min.  He was previously on amiodarone which is also stabilizing his arrhythmias but this was discontinued due to a report of torsades.  For his ventricular tachycardia and continued high burden of ventricular ectopy, I recommend transitioning to amiodarone.  I would start amiodarone at 0.5 mg/min and then reduce the dose of lidocaine to 1 mg/min 2 hours later.  I would recommend checking an EKG 4 hours after starting the amiodarone to confirm a stable QTC.  He is not a candidate for invasive therapies for his ventricular tachycardia at this time.  Also recommend that you recheck his lidocaine level given the result from today looks like it was drawn upstream from the lidocaine infusion.   Lysbeth Galas T. Quentin Ore, MD, [facility] Cardiac Electrophysiology

## 2020-01-21 NOTE — Progress Notes (Addendum)
Progress Note  Patient Name: Travis Maxwell Date of Encounter: 01/21/2020  [facility] Cardiologist: Dr. Nehemiah Massed [facility]  Subjective   Patient remains intubated and sedated.  Increased ventricular ectopy was noted on a reduced dose of lidocaine, prompting reescalation.  Patient received 1 dose of Entresto yesterday evening.  He remains on phenylephrine due to hypotension.  Per nursing, patient becomes tachypneic, diaphoretic, and tachycardic during spontaneous breathing trials.  Inpatient Medications    Scheduled Meds: . allopurinol  300 mg Per Tube Daily  . apixaban  5 mg Per Tube BID  . aspirin  81 mg Per Tube Daily  . atorvastatin  40 mg Per Tube QHS  . chlorhexidine  15 mL Mouth Rinse BID  . chlorhexidine gluconate (MEDLINE KIT)  15 mL Mouth Rinse BID  . Chlorhexidine Gluconate Cloth  6 each Topical Daily  . docusate  100 mg Per Tube BID  . feeding supplement (PROSource TF)  45 mL Per Tube Daily  . fentaNYL (SUBLIMAZE) injection  25 mcg Intravenous Once  . insulin aspart  0-9 Units Subcutaneous Q4H  . mouth rinse  15 mL Mouth Rinse 10 times per day  . metoprolol succinate  25 mg Oral Daily  . polyethylene glycol  17 g Per Tube Daily  . senna-docusate  1 tablet Per Tube BID  . sodium chloride flush  10-40 mL Intracatheter Q12H  . sodium chloride flush  3 mL Intravenous Q12H   Continuous Infusions: . sodium chloride 10 mL/hr at 01/12/20 1915  . sodium chloride 250 mL (01/20/20 0907)  . ceFEPime (MAXIPIME) IV 2 g (01/21/20 1447)  . famotidine (PEPCID) IV Stopped (01/21/20 0956)  . feeding supplement (VITAL AF 1.2 CAL) 50 mL/hr at 01/21/20 1145  . fentaNYL infusion INTRAVENOUS 150 mcg/hr (01/21/20 1145)  . lidocaine 4 mg/min (01/21/20 1335)  . midazolam 0.5 mg/hr (01/21/20 1410)  . phenylephrine (NEO-SYNEPHRINE) Adult infusion 90 mcg/min (01/21/20 1510)   PRN Meds: sodium chloride, acetaminophen, albuterol, diazepam, fentaNYL, haloperidol lactate,  metoprolol tartrate, ondansetron (ZOFRAN) IV, oxyCODONE, polyethylene glycol, sodium chloride flush, sodium chloride flush   Vital Signs    Vitals:   01/21/20 1415 01/21/20 1430 01/21/20 1445 01/21/20 1500  BP: 103/68 119/69 (!) 155/83 138/79  Pulse: 84 87 95 89  Resp: _0 Temp: 98.4 F (36.9 C) 98.6 F (37 C) 98.6 F (37 C) 98.6 F (37 C)  TempSrc:      SpO2: 97% 96% 98% 97%  Weight:      Height:        Intake/Output Summary (Last 24 hours) at 01/21/2020 1535 Last data filed at 01/21/2020 1400 Gross per 24 hour  Intake 2121.38 ml  Output 1080 ml  Net 1041.38 ml   Last 3 Weights 01/20/2020 01/19/2020 01/18/2020  Weight (lbs) 149 lb 7.6 oz 151 lb 7.3 oz 153 lb  Weight (kg) 67.8 kg 68.7 kg 69.4 kg      Telemetry    Sinus tach PACs and PVCs.  Some runs of wide-complex tachycardia could represent VT versus ST/SVT with aberrancy. - Personally Reviewed  ECG    No new tracing  Physical Exam   GEN:  Intubated and sedated Neck:  Unable to assess JVP due to support devices and positioning. Cardiac: RRR with occasional extrasystoles.  1/6 systolic murmur noted. Respiratory: Clear anteriorly. GI: Soft, nontender, non-distended  MS: No edema; No deformity. Neuro:   Intubated and sedated. Psych: Intubated and sedated.  Labs    High  Sensitivity Troponin:   Recent Labs  Lab 01/09/20 0152 01/16/20 0245 01/16/20 0702 01/16/20 0838 01/19/20 0433  TROPONINIHS 89* 934* 711* 674* 234*      Chemistry Recent Labs  Lab 01/15/20 0445 01/16/20 0245 01/16/20 1900 01/17/20 0430 01/19/20 1316 01/20/20 0433 01/21/20 0409  NA 140   < > 138   < > 142 137 139  K 3.4*   < > 3.4*   < > 4.4 4.0 4.4  CL 104   < > 101   < > 106 106 107  CO2 26   < > 23   < > _0 GLUCOSE 149*   < > 165*   < > 195* 317* 202*  BUN 28*   < > 24*   < > 24* 24* 29*  CREATININE 0.77   < > 0.92   < > 1.18 1.09 1.07  CALCIUM 9.0   < > 8.7*   < > 8.8* 8.1* 8.7*  PROT 7.0  --  8.3*  --    --   --   --   ALBUMIN 2.6*  --  3.2*  --   --   --   --   AST 31  --  29  --   --   --   --   ALT 16  --  13  --   --   --   --   ALKPHOS 66  --  77  --   --   --   --   BILITOT 1.2  --  1.1  --   --   --   --   GFRNONAA >60   < > >60   < > >60 >60 >60  ANIONGAP 10   < > 14   < > _1 < > = values in this interval not displayed.     Hematology Recent Labs  Lab 01/19/20 0419 01/20/20 0433 01/21/20 0409  WBC 14.8* 21.0* 20.7*  RBC 7.33* 7.32* 7.56*  HGB 14.9 15.0 15.6  HCT 49.3 50.1 52.7*  MCV 67.3* 68.4* 69.7*  MCH 20.3* 20.5* 20.6*  MCHC 30.2 29.9* 29.6*  RDW 25.7* 25.3* 26.0*  PLT 1,143* 932* 876*    BNPNo results for input(s): BNP, PROBNP in the last 168 hours.   DDimer No results for input(s): DDIMER in the last 168 hours.   Radiology    DG Abd 1 View  Result Date: 01/20/2020 CLINICAL DATA:  68 year old male status post NG placement. EXAM: ABDOMEN - 1 VIEW COMPARISON:  Abdominal radiograph dated 01/19/2020. FINDINGS: Enteric tube with tip and side-port in the left upper abdomen. The tip of the enteric tube is likely at the gastric fundus. Air is noted throughout the colon. Cardiomegaly with mechanical cardiac valve. IMPRESSION: Enteric tube with tip likely in the region of the gastric fundus. Electronically Signed   By: Anner Crete M.D.   On: 01/20/2020 15:57    Cardiac Studies   Echocardiogram (08/13/2019): 1. Left ventricular ejection fraction, by estimation, is <20%. The left  ventricle has severely decreased function. The left ventricle demonstrates  global hypokinesis. The left ventricular internal cavity size was severely  dilated. There is mild left  ventricular hypertrophy. Left ventricular diastolic parameters were  normal.  2. Right ventricular systolic function is normal. The right ventricular  size is normal.  3. Left atrial size was moderately dilated.  4. Right atrial size was mildly dilated.  5. The mitral  valve is normal in  structure. Moderate to severe mitral  valve regurgitation.  6. Tricuspid valve regurgitation is mild to moderate.  7. The aortic valve is normal in structure. Aortic valve regurgitation is  mild.   Patient Profile     69 y.o. 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  Assessment & Plan    Chronic HFrEF: Based on prior catheterization, I suspect cardiomyopathy is primarily nonischemic.  Ever, given increasing ventricular tachycardia, it seems prudent to exclude interval development of new CAD.  Patient had been on Entresto and remains on metoprolol succinate.  I agree with discontinuation of Entresto in the setting of hypotension that has required phenylephrine.  Continue low-dose metoprolol.  Defer addition of ACE inhibitor/ARB/Entresto in the setting of hypotension.  One could consider adding spironolactone if patient can be weaned from phenylephrine.  Plan for right and left heart catheterization on Friday with Dr. Fletcher Anon, to allow for washout of apixaban.  Repeat echocardiogram to reassess LV function (likely still severely reduced) and assess for other structural abnormalities to help determine what advanced therapies may be available to Travis Maxwell.  If he can be extubated, evaluation by our advanced heart failure team to determine if he is a candidate for LVAD placement will need to be considered.  Maintain net even to slightly negative fluid balance.  Check central venous O2 saturation.  Ventricular  tachycardia: Frequent ventricular ectopy is still noted.  Patient seen by EP today with final recommendations pending.  Continue lidocaine, though favor decreasing dose to avoid toxicity, if possible.  If patient has recurrent VT, favor retrial of IV amiodarone.  Continue low-dose metoprolol.  Replete electrolytes to maintain potassium and magnesium levels greater than 4.0 and 2.0, respectively.  Hypotension: Most likely drug-induced through a combination of metoprolol, Entresto, and propofol.  Propofol and Entresto have been discontinued.  Continue to avoid medications that could exacerbate hypotension.  Wean off phenylephrine, as increased afterload can worsen hemodynamics and may accentuate ventricular arrhythmias.  Coronary artery disease: Patient with history of CAD involving nondominant RCA status post PCI 2019.  Continue atorvastatin and low-dose aspirin.  Paroxysmal atrial fibrillation:  Discontinue apixaban.  Initiate IV heparin when next dose of apixaban would be due in anticipation of cardiac catheterization later this week.  Continue metoprolol.  Leukocytosis and thrombocytosis: Concerning for underlying infectious etiology.  Ongoing work-up and treatment per CCM.  Acute respiratory failure: Patient unable to be weaned from ventilator.  Palliative care notes indicate that Travis Maxwell family would not wish for him to undergo tracheostomy placement.  Gentle diuresis to maintain net even or slightly negative fluid balance.  Ventilator management per CCM.  CRITICAL CARE Performed by: Nelva Bush, MD  Total critical care time: 40 minutes  Critical care time was exclusive of separately billable procedures and treating other patients.  Critical care was necessary to treat or prevent imminent or life-threatening deterioration.  Critical care was time spent personally by me (independent of midlevel providers or residents) on the following activities:  development of treatment plan with patient and/or surrogate as well as nursing, discussions with consultants, evaluation of patient's response to  treatment, examination of patient, obtaining history from patient or surrogate, ordering and performing treatments and interventions, ordering and review of laboratory studies, ordering and review of radiographic studies, pulse oximetry and re-evaluation of patient's condition.   For questions or updates, please contact [place] Please consult [email or web address] for contact info under [facility].     Signed, Nelva Bush, MD  01/21/2020, 3:35 PM

## 2020-01-21 NOTE — Progress Notes (Signed)
ANTICOAGULATION CONSULT NOTE   Pharmacy Consult for heparin Indication: atrial fibrillation  Allergies  Allergen Reactions  . Penicillins Other (See Comments)    Has patient had a PCN reaction causing immediate rash, facial/tongue/throat swelling, SOB or lightheadedness with hypotension: Unknown Has patient had a PCN reaction causing severe rash involving mucus membranes or skin necrosis: Unknown Has patient had a PCN reaction that required hospitalization: Unknown Has patient had a PCN reaction occurring within the last 10 years: No If all of the above answers are "NO", then may proceed with Cephalosporin use.     Patient Measurements: Height: 5\' 7"  (170.2 cm) Weight: 67.8 kg (149 lb 7.6 oz) IBW/kg (Calculated) : 66.1 Heparin Dosing Weight: 67 kg  Vital Signs: Temp: 98.6 F (37 C) (11/16 1500) Temp Source: Rectal (11/16 1145) BP: 138/79 (11/16 1500) Pulse Rate: 89 (11/16 1500)  Labs: Recent Labs    01/19/20 0419 01/19/20 0419 01/19/20 0433 01/19/20 0554 01/19/20 1316 01/20/20 0433 01/21/20 0409  HGB 14.9   < >  --   --   --  15.0 15.6  HCT 49.3  --   --   --   --  50.1 52.7*  PLT 1,143*  --   --   --   --  932* 876*  CREATININE 1.00  --   --    < > 1.18 1.09 1.07  TROPONINIHS  --   --  234*  --   --   --   --    < > = values in this interval not displayed.    Estimated Creatinine Clearance: 62.6 mL/min (by C-G formula based on SCr of 1.07 mg/dL).   Medical History: Past Medical History:  Diagnosis Date  . CAD (coronary artery disease)   . Chronic systolic (congestive) heart failure (HCC)   . Diabetes mellitus without complication (HCC)   . GERD (gastroesophageal reflux disease)   . Gout   . Hx of aortic valve replacement    Bioprosthetic valve  . Hypertension       Assessment: 69 year old male with h/o CAD, HFrEF, aortic regurgitation and stenosis s/p bioprosthetic aortic valve, afib on Eliquis. Patient with episodes of ventricular tachycardia  requiring defibrillation multiple times. Patient has been on Eliquis here. Plan is for heart cath Friday after appropriate Eliquis washout. Plan to start heparin in interim.  Goal of Therapy:  Heparin level 0.3-0.7 units/ml aPTT 66-102 seconds Monitor platelets by anticoagulation protocol: Yes   Plan:  Per Cardiology, start heparin at time of next Eliquis dose without initial bolus. Will start heparin 950 units/hr tonight at 2200. Plan to follow aPTT as HL will be elevated in setting of recent Eliquis use. HL and aPTT ordered tomorrow at 0400. Daily CBC and HL while on heparin drip.  Saturday, PharmD 01/21/2020,3:56 PM

## 2020-01-21 NOTE — Progress Notes (Signed)
PHARMACY CONSULT NOTE - FOLLOW UP  Pharmacy Consult for Electrolyte Monitoring and Replacement   Recent Labs: Potassium (mmol/L)  Date Value  01/21/2020 4.4   Magnesium (mg/dL)  Date Value  25/49/8264 1.9   Calcium (mg/dL)  Date Value  15/83/0940 8.7 (L)   Albumin (g/dL)  Date Value  76/80/8811 3.2 (L)   Phosphorus (mg/dL)  Date Value  05/19/9456 2.6   Sodium (mmol/L)  Date Value  01/21/2020 139   Assessment: 69 year old male transferred to ICU for acute toxic metabolic encephalopathy. Patient now more alert and awake. Overnight 11/10, patient with two runs pulseless VT, with second episode requiring CPR and defibrillation. Converted back into baseline bundle branch rhythm with rate in 50's-60's.  Pt received magnesium 2 gm during episode. Similar episodes 11/11 requiring defibrillation. Pharmacy to manage electrolytes.  Patient intubated 11/14.  Goal of Therapy:  Electrolytes WNL  Plan:  No replacement indicated. Follow daily.  Pricilla Riffle ,PharmD Clinical Pharmacist 01/21/2020 1:49 PM

## 2020-01-22 DIAGNOSIS — I472 Ventricular tachycardia, unspecified: Secondary | ICD-10-CM

## 2020-01-22 DIAGNOSIS — R627 Adult failure to thrive: Secondary | ICD-10-CM | POA: Diagnosis not present

## 2020-01-22 DIAGNOSIS — R410 Disorientation, unspecified: Secondary | ICD-10-CM | POA: Diagnosis not present

## 2020-01-22 DIAGNOSIS — J9601 Acute respiratory failure with hypoxia: Secondary | ICD-10-CM | POA: Diagnosis not present

## 2020-01-22 DIAGNOSIS — I5023 Acute on chronic systolic (congestive) heart failure: Secondary | ICD-10-CM | POA: Diagnosis not present

## 2020-01-22 LAB — CBC WITH DIFFERENTIAL/PLATELET
Abs Immature Granulocytes: 0.27 10*3/uL — ABNORMAL HIGH (ref 0.00–0.07)
Basophils Absolute: 0.2 10*3/uL — ABNORMAL HIGH (ref 0.0–0.1)
Basophils Relative: 1 %
Eosinophils Absolute: 0.7 10*3/uL — ABNORMAL HIGH (ref 0.0–0.5)
Eosinophils Relative: 4 %
HCT: 47 % (ref 39.0–52.0)
Hemoglobin: 13.9 g/dL (ref 13.0–17.0)
Immature Granulocytes: 1 %
Lymphocytes Relative: 8 %
Lymphs Abs: 1.6 10*3/uL (ref 0.7–4.0)
MCH: 20.6 pg — ABNORMAL LOW (ref 26.0–34.0)
MCHC: 29.6 g/dL — ABNORMAL LOW (ref 30.0–36.0)
MCV: 69.7 fL — ABNORMAL LOW (ref 80.0–100.0)
Monocytes Absolute: 2.2 10*3/uL — ABNORMAL HIGH (ref 0.1–1.0)
Monocytes Relative: 11 %
Neutro Abs: 14.2 10*3/uL — ABNORMAL HIGH (ref 1.7–7.7)
Neutrophils Relative %: 75 %
Platelets: 874 10*3/uL — ABNORMAL HIGH (ref 150–400)
RBC: 6.74 MIL/uL — ABNORMAL HIGH (ref 4.22–5.81)
RDW: 26.1 % — ABNORMAL HIGH (ref 11.5–15.5)
Smear Review: NORMAL
WBC: 19.1 10*3/uL — ABNORMAL HIGH (ref 4.0–10.5)
nRBC: 0 % (ref 0.0–0.2)

## 2020-01-22 LAB — ECHOCARDIOGRAM COMPLETE
AV Mean grad: 12 mmHg
AV Peak grad: 17.3 mmHg
Ao pk vel: 2.08 m/s
Area-P 1/2: 3.91 cm2
Height: 67 in
S' Lateral: 5.63 cm
Weight: 2391.55 oz

## 2020-01-22 LAB — BASIC METABOLIC PANEL
Anion gap: 6 (ref 5–15)
Anion gap: 11 (ref 5–15)
BUN: 29 mg/dL — ABNORMAL HIGH (ref 8–23)
BUN: 28 mg/dL — ABNORMAL HIGH (ref 8–23)
CO2: 24 mmol/L (ref 22–32)
CO2: 23 mmol/L (ref 22–32)
Calcium: 7.7 mg/dL — ABNORMAL LOW (ref 8.9–10.3)
Calcium: 8.4 mg/dL — ABNORMAL LOW (ref 8.9–10.3)
Chloride: 101 mmol/L (ref 98–111)
Chloride: 104 mmol/L (ref 98–111)
Creatinine, Ser: 0.83 mg/dL (ref 0.61–1.24)
Creatinine, Ser: 0.88 mg/dL (ref 0.61–1.24)
GFR, Estimated: >60 mL/min (ref >60)
GFR, Estimated: >60 mL/min (ref >60)
Glucose, Bld: 360 mg/dL — ABNORMAL HIGH (ref 70–99)
Glucose, Bld: 214 mg/dL — ABNORMAL HIGH (ref 70–99)
Potassium: 3.9 mmol/L (ref 3.5–5.1)
Potassium: 4.3 mmol/L (ref 3.5–5.1)
Sodium: 131 mmol/L — ABNORMAL LOW (ref 135–145)
Sodium: 138 mmol/L (ref 135–145)

## 2020-01-22 LAB — CULTURE, RESPIRATORY W GRAM STAIN

## 2020-01-22 LAB — GLUCOSE, CAPILLARY
Glucose-Capillary: 120 mg/dL — ABNORMAL HIGH (ref 70–99)
Glucose-Capillary: 168 mg/dL — ABNORMAL HIGH (ref 70–99)
Glucose-Capillary: 193 mg/dL — ABNORMAL HIGH (ref 70–99)
Glucose-Capillary: 185 mg/dL — ABNORMAL HIGH (ref 70–99)
Glucose-Capillary: 176 mg/dL — ABNORMAL HIGH (ref 70–99)
Glucose-Capillary: 181 mg/dL — ABNORMAL HIGH (ref 70–99)

## 2020-01-22 LAB — PHOSPHORUS
Phosphorus: 2.4 mg/dL — ABNORMAL LOW (ref 2.5–4.6)
Phosphorus: 2.5 mg/dL (ref 2.5–4.6)

## 2020-01-22 LAB — LIDOCAINE LEVEL: Lidocaine Lvl: 7.2 ug/mL — ABNORMAL HIGH (ref 1.5–5.0)

## 2020-01-22 LAB — APTT
aPTT: 65 seconds — ABNORMAL HIGH (ref 24–36)
aPTT: 53 seconds — ABNORMAL HIGH (ref 24–36)

## 2020-01-22 LAB — TRIGLYCERIDES: Triglycerides: 38 mg/dL (ref <150)

## 2020-01-22 LAB — HEPARIN LEVEL (UNFRACTIONATED): Heparin Unfractionated: 1.41 IU/mL — ABNORMAL HIGH (ref 0.30–0.70)

## 2020-01-22 LAB — MAGNESIUM: Magnesium: 1.9 mg/dL (ref 1.7–2.4)

## 2020-01-22 MED ORDER — HEPARIN BOLUS VIA INFUSION
2200.0000 [IU] | Freq: Once | INTRAVENOUS | Status: AC
  Administered 2020-01-22: 2200 [IU] via INTRAVENOUS
  Filled 2020-01-22: qty 2200

## 2020-01-22 MED ORDER — LIDOCAINE IN D5W 4-5 MG/ML-% IV SOLN
1.0000 mg/min | INTRAVENOUS | Status: DC
  Administered 2020-01-23: 1 mg/min via INTRAVENOUS
  Filled 2020-01-22 (×2): qty 500

## 2020-01-22 MED ORDER — HEPARIN (PORCINE) 25000 UT/250ML-% IV SOLN
1300.0000 [IU]/h | INTRAVENOUS | Status: DC
  Administered 2020-01-22: 1300 [IU]/h via INTRAVENOUS
  Filled 2020-01-22: qty 250

## 2020-01-22 MED ORDER — AMIODARONE HCL IN DEXTROSE 360-4.14 MG/200ML-% IV SOLN
30.0000 mg/h | INTRAVENOUS | Status: DC
  Administered 2020-01-22 – 2020-01-29 (×15): 30 mg/h via INTRAVENOUS
  Filled 2020-01-22 (×15): qty 200

## 2020-01-22 MED ORDER — LINEZOLID 600 MG PO TABS
600.0000 mg | ORAL_TABLET | Freq: Two times a day (BID) | ORAL | Status: DC
  Administered 2020-01-22 – 2020-01-25 (×7): 600 mg
  Filled 2020-01-22 (×11): qty 1

## 2020-01-22 NOTE — Progress Notes (Signed)
ANTICOAGULATION CONSULT NOTE   Pharmacy Consult for heparin Indication: atrial fibrillation  Allergies  Allergen Reactions  . Penicillins Other (See Comments)    Has patient had a PCN reaction causing immediate rash, facial/tongue/throat swelling, SOB or lightheadedness with hypotension: Unknown Has patient had a PCN reaction causing severe rash involving mucus membranes or skin necrosis: Unknown Has patient had a PCN reaction that required hospitalization: Unknown Has patient had a PCN reaction occurring within the last 10 years: No If all of the above answers are "NO", then may proceed with Cephalosporin use.     Patient Measurements: Height: 5\' 7"  (170.2 cm) Weight: 73.9 kg (162 lb 14.7 oz) IBW/kg (Calculated) : 66.1 Heparin Dosing Weight: 67 kg  Vital Signs: Temp: 99.1 F (37.3 C) (11/17 1630) Temp Source: Rectal (11/17 1600) BP: 103/72 (11/17 1630) Pulse Rate: 74 (11/17 1630)  Labs: Recent Labs    01/20/20 0433 01/20/20 0433 01/21/20 0409 01/22/20 0414 01/22/20 1610  HGB 15.0   < > 15.6 13.9  --   HCT 50.1  --  52.7* 47.0  --   PLT 932*  --  876* 874*  --   APTT  --   --   --  65* 53*  HEPARINUNFRC  --   --   --  1.41*  --   CREATININE 1.09   < > 1.07 0.83 0.88   < > = values in this interval not displayed.    Estimated Creatinine Clearance: 76.2 mL/min (by C-G formula based on SCr of 0.88 mg/dL).   Medical History: Past Medical History:  Diagnosis Date  . CAD (coronary artery disease)   . Chronic systolic (congestive) heart failure (HCC)   . Diabetes mellitus without complication (HCC)   . GERD (gastroesophageal reflux disease)   . Gout   . Hx of aortic valve replacement    Bioprosthetic valve  . Hypertension       Assessment: 69 year old male with h/o CAD, HFrEF, aortic regurgitation and stenosis s/p bioprosthetic aortic valve, afib on Eliquis. Patient with episodes of ventricular tachycardia requiring defibrillation multiple times. Patient has  been on Eliquis here. Plan is for heart cath Friday after appropriate Eliquis washout. Plan to start heparin in interim.  11/17 0414 aPTT 65 11/17 1610 aPTT 53 Subtherapeutic  Goal of Therapy:  Heparin level 0.3-0.7 units/ml aPTT 66-102 seconds Monitor platelets by anticoagulation protocol: Yes   Plan:  Ordered additional bolus of 2200 unit and increased infusion to 1300 unit/hr. Plan to follow aPTT as HL will be elevated in setting of recent Eliquis use. Next aPTT ordered for 11/18 @ 0000 and daily CBC and HL ordered for tomorrow AM while on heparin drip.  12/18, PharmD, [facility] 01/22/2020 6:17 PM

## 2020-01-22 NOTE — Plan of Care (Signed)
  Problem: Education: Goal: Ability to demonstrate management of disease process will improve 01/22/2020 0816 by Cheral Almas, RN Outcome: Not Progressing 01/22/2020 0816 by Cheral Almas, RN Outcome: Progressing Goal: Ability to verbalize understanding of medication therapies will improve 01/22/2020 0816 by Cheral Almas, RN Outcome: Not Progressing 01/22/2020 0816 by Cheral Almas, RN Outcome: Progressing Goal: Individualized Educational Video(s) Outcome: Progressing   Problem: Activity: Goal: Capacity to carry out activities will improve 01/22/2020 0816 by Cheral Almas, RN Outcome: Progressing 01/22/2020 0816 by Cheral Almas, RN Outcome: Progressing   Problem: Cardiac: Goal: Ability to achieve and maintain adequate cardiopulmonary perfusion will improve 01/22/2020 0816 by Cheral Almas, RN Outcome: Progressing 01/22/2020 0816 by Cheral Almas, RN Outcome: Progressing   Problem: Education: Goal: Knowledge of General Education information will improve Description: Including pain rating scale, medication(s)/side effects and non-pharmacologic comfort measures Outcome: Progressing

## 2020-01-22 NOTE — Progress Notes (Signed)
PHARMACY CONSULT NOTE - FOLLOW UP  Pharmacy Consult for Electrolyte Monitoring and Replacement   Recent Labs: Potassium (mmol/L)  Date Value  01/22/2020 3.9   Magnesium (mg/dL)  Date Value  69/79/4801 2.0   Calcium (mg/dL)  Date Value  65/53/7482 7.7 (L)   Albumin (g/dL)  Date Value  70/78/6754 3.2 (L)   Phosphorus (mg/dL)  Date Value  49/20/1007 2.4 (L)   Sodium (mmol/L)  Date Value  01/22/2020 131 (L)   Assessment: 69 year old male transferred to ICU for acute toxic metabolic encephalopathy. Patient now more alert and awake. Overnight 11/10, patient with two runs pulseless VT, with second episode requiring CPR and defibrillation. Converted back into baseline bundle branch rhythm with rate in 50's-60's.  Pt received magnesium 2 gm during episode. Similar episodes 11/11 requiring defibrillation. Pharmacy to manage electrolytes.  Patient intubated 11/14.  Goal of Therapy:  Electrolytes WNL  Plan:  Phos slightly low but should improve with nutrition. No replacement indicated at this time. Repeat electrolytes ordered for 1600 by team. Continue to follow along.  Pricilla Riffle ,PharmD Clinical Pharmacist 01/22/2020 2:25 PM

## 2020-01-22 NOTE — Progress Notes (Signed)
CRITICAL CARE NOTE  [name] presenting to the ED on 01/06/2020 in respiratory distress, complaining of chest pain and shortness of breath. He also reported orthopnea and increased lower extremity swelling. History was limited due to acuity of condition and respiratory status. Patient was placed on biPAP, but progressively worsened and was emergently intubated. Patient is a current 0.5ppd smoker and uses marijuana. He has a severe metabolic acidosis. Patient has a past medical history significant for HTN, T2DM, HLD, GERD, CAD (s/p PCI), AVR with bioprosthetic valve, systolic CHF with EF of 70%, and paroxysmal A. Fib, currently anticoagulated on Eliquis. EKG upon arrival to ED revealed sinus rhythm at a rate of 99bpm with RBBB and PVCs with ST elevation in II, III, avF, V4-V6. Cardiologist on call stated that STEMI criteria were not met and advised to manage CHF. Patient received IV diltiazem and reported improvement of symptoms. Labs were notable for high sensitivity troponin 71->91->89. BNP 1046, creatinine 1.26, BUN 21, potassium 5.4. CXR showed improved pulmonary edema since prior study with chronic pulmonary venous hypertension. Notably, patient was admitted to [facility] ED 12/04/2019 for acute respiratory failure and acute on chronic systolic CHF in the setting of COVID pneumonia.   SIGNIFICANT EVENTS  11/02 - severe cardiogenic shock, remains on vent 11/03 - severe respiratory failure, severe systolic CHF 26/37 - patient remains critically ill 11/05 - patient remains critically ill 11/06 - off pressors, extubated to biPAP 11/07 - remains extubated, febrile, MSSA+ tracheal culture - started ancef 11/08 - remains extubated, on 2L by nasal cannula. Afebrile today with white count of 21.3K, up from 15.7K yesterday. Still encephalopathic and drowsy. On and off Precedex. Went into narrow complex SVT,then progressed to wide complex tachycardiawas shocked and given a loading dose of amiodarone. Will  remain on amiodarone gtt.Cardiology at bedside.  11/09 - remains extubated, on 2L by nasal cannula. Afebrile today with white count of 19.2K, down since yesterday. BP soft overnight, was started on Neo-synephrine infusion. More alert than yesterday, but only oriented to self. Failed Yale 3oz swallow study early this morning, started coughing at the end of the study. Will need to remain NPO until cleared by SLP. Patient has been educated on this, yet continues to incessantly ask for water.  11/10 - patient clinically improved. He is alert communicative and in no distress. He ate entire meal without issues. Still has confusion intermittently during interview. Frequent PVCs on telemetry.  Will initiate OT/PT today for downgrade planning. 11/11 - patient minimally symptomatic, he again had episodes of Torsades today s/p 120Jx1 mag 2g.  Poor prognosis overall with advanced CHF.  11/12 - Dicussed case with cardiology. Antiarrythmic agents being modified. Will also call consult for Electrophysiology  11/13 - Patient with no arrythmia overnight. He remains on precedex and neosynephrine.  I spoke with cardiology Dr Rayann Heman regarding EP evaluation- he recommended to continue with current medical therapy and have cardiologist call to EP on Monday to review options.  He has 1:1 sitter and is resting in bed comfortably. Spoke with Velva Harman POA daughter to update on poor prognosis worsening clinical condition.  11/14 - further deterioration overnight.  Patient required reintubation and MV.  Updated next of kin daughter Velva Harman this am and sent message to cardiology to discuss poor prognosis with family.    11/15 - remains intubated and sedated, high risk for cardiac arrest and death February 18, 2023 - remains intubated and sedated, frequent PVCs overnight, 2g magnesium given, awaiting EP consult, patient becomes tachypneic and diaphoretic during  SBTs 11/17 - increased ectopy, seen by EP who advised weaning lidocaine and restarting  amiodarone, planning for right & left heart cath on Friday 11/19 with Dr. Fletcher Anon, discontinue Eliquis, start IV heparin interim, tracheal culture 11/14 positive for MRSA and pseudomonas, started cefepime and linezolid  CC  Follow up respiratory failure  SUBJECTIVE Patient remains critically ill Prognosis is guarded Remains on pressors   BP 123/77 (BP Location: Left Arm)   Pulse 74   Temp 98.6 F (37 C) (Rectal)   Resp 19   Ht $R'5\' 7"'IC$  (1.702 m)   Wt 73.9 kg   SpO2 98%   BMI 25.52 kg/m    I/O last 3 completed shifts: In: 4039.5 [I.V.:2246.2; NG/GT:1488; IV Piggyback:305.3] Out: 1900 [Urine:1900] Total I/O In: 204.7 [I.V.:127.7; IV Piggyback:77] Out: 75 [Urine:75]  SpO2: 98 % O2 Flow Rate (L/min): 2 L/min FiO2 (%): 40 %  Estimated body mass index is 25.52 kg/m as calculated from the following:   Height as of this encounter: $RemoveBeforeD'5\' 7"'[id number]$  (1.702 m).   Weight as of this encounter: 73.9 kg.  SIGNIFICANT EVENTS   REVIEW OF SYSTEMS  PATIENT IS UNABLE TO PROVIDE COMPLETE REVIEW OF SYSTEMS DUE TO SEVERE CRITICAL ILLNESS   Pressure Injury 01/20/20 Sacrum Posterior;Medial Stage 2 -  Partial thickness loss of dermis presenting as a shallow open injury with a red, pink wound bed without slough. (Active)  01/20/20 0800  Location: Sacrum  Location Orientation: Posterior;Medial  Staging: Stage 2 -  Partial thickness loss of dermis presenting as a shallow open injury with a red, pink wound bed without slough.  Wound Description (Comments):   Present on Admission: No    PHYSICAL EXAMINATION:  GENERAL: Critically ill appearing, intubated and sedated HEAD: Normocephalic, atraumatic.  EYES: Pupils equal, round, reactive to light.  No scleral icterus.  MOUTH: Moist mucosal membrane. NECK: Supple.  PULMONARY: Vented breath sounds CARDIOVASCULAR: S1 and S2. Regular rate and rhythm. No murmurs, rubs, or gallops.  GASTROINTESTINAL: Soft, nontender, non-distended.  Positive bowel sounds.    MUSCULOSKELETAL: No swelling, clubbing, or edema.  NEUROLOGIC: Obtunded, GCS<8 SKIN: Intact, warm, dry  MEDICATIONS: I have reviewed all medications and confirmed regimen as documented Scheduled Meds: . allopurinol  300 mg Per Tube Daily  . aspirin  81 mg Per Tube Daily  . atorvastatin  40 mg Per Tube QHS  . chlorhexidine  15 mL Mouth Rinse BID  . chlorhexidine gluconate (MEDLINE KIT)  15 mL Mouth Rinse BID  . Chlorhexidine Gluconate Cloth  6 each Topical Daily  . docusate  100 mg Per Tube BID  . feeding supplement (PROSource TF)  45 mL Per Tube Daily  . fentaNYL (SUBLIMAZE) injection  25 mcg Intravenous Once  . furosemide  20 mg Intravenous Daily  . insulin aspart  0-9 Units Subcutaneous Q4H  . linezolid  600 mg Per Tube Q12H  . mouth rinse  15 mL Mouth Rinse 10 times per day  . metoprolol succinate  25 mg Oral Daily  . polyethylene glycol  17 g Per Tube Daily  . senna-docusate  1 tablet Per Tube BID  . sodium chloride flush  10-40 mL Intracatheter Q12H  . sodium chloride flush  3 mL Intravenous Q12H   Continuous Infusions: . sodium chloride 10 mL/hr at 01/12/20 1915  . sodium chloride 250 mL (01/20/20 0907)  . ceFEPime (MAXIPIME) IV Stopped (01/22/20 0631)  . famotidine (PEPCID) IV 20 mg (01/22/20 0918)  . feeding supplement (VITAL AF 1.2 CAL) 60 mL/hr at  01/21/20 1600  . fentaNYL infusion INTRAVENOUS 100 mcg/hr (01/22/20 0800)  . heparin 1,050 Units/hr (01/22/20 0921)  . lidocaine 2 mg/min (01/22/20 1129)  . midazolam 4 mg/hr (01/22/20 1051)  . phenylephrine (NEO-SYNEPHRINE) Adult infusion 70 mcg/min (01/22/20 1045)   PRN Meds:.sodium chloride, acetaminophen, albuterol, diazepam, fentaNYL, haloperidol lactate, metoprolol tartrate, ondansetron (ZOFRAN) IV, oxyCODONE, polyethylene glycol, sodium chloride flush, sodium chloride flush   CULTURE RESULTS   Recent Results (from the past 240 hour(s))  Culture, respiratory     Status: None (Preliminary result)   Collection  Time: 01/19/20  3:55 PM   Specimen: Tracheal Aspirate; Respiratory  Result Value Ref Range Status   Specimen Description   Final    TRACHEAL ASPIRATE Performed at [facility], [address] [place], [place] [address]    Special Requests   Final    NONE Performed at [facility], [address] [place], [place] [address]    Gram Stain   Final    MODERATE WBC PRESENT, PREDOMINANTLY PMN ABUNDANT GRAM POSITIVE COCCI IN PAIRS IN CLUSTERS ABUNDANT GRAM NEGATIVE RODS    Culture   Final    ABUNDANT STAPHYLOCOCCUS AUREUS MODERATE PSEUDOMONAS AERUGINOSA SUSCEPTIBILITIES TO FOLLOW Performed at [facility], [place] [address], [place], [place] [address]    Report Status PENDING  Incomplete    LABS  CBC Latest Ref Rng & Units 01/22/2020 01/21/2020 01/20/2020  WBC 4.0 - 10.5 K/uL 19.1(H) 20.7(H) 21.0(H)  Hemoglobin 13.0 - 17.0 g/dL 13.9 15.6 15.0  Hematocrit 39 - 52 % 47.0 52.7(H) 50.1  Platelets 150 - 400 K/uL 874(H) 876(H) 932(HH)   BMP Latest Ref Rng & Units 01/22/2020 01/21/2020 01/20/2020  Glucose 70 - 99 mg/dL 360(H) 202(H) 317(H)  BUN 8 - 23 mg/dL 29(H) 29(H) 24(H)  Creatinine 0.61 - 1.24 mg/dL 0.83 1.07 1.09  Sodium 135 - 145 mmol/L 131(L) 139 137  Potassium 3.5 - 5.1 mmol/L 3.9 4.4 4.0  Chloride 98 - 111 mmol/L 101 107 106  CO2 22 - 32 mmol/L $RemoveB'24 24 22  'CqRhSNpn$ Calcium 8.9 - 10.3 mg/dL 7.7(L) 8.7(L) 8.1(L)    Intake/Output Summary (Last 24 hours) at 01/22/2020 0917 Last data filed at 01/22/2020 0800 Gross per 24 hour  Intake 2699.44 ml  Output 1300 ml  Net 1399.44 ml    IMAGING    ECHOCARDIOGRAM COMPLETE  Result Date: 01/22/2020    ECHOCARDIOGRAM REPORT   Patient Name:   Travis Maxwell Date of Exam: 01/21/2020 Medical Rec #:  [id number]      Height:       67.0 in Accession #:    [id number]     Weight:       149.5 lb Date of Birth:  1951/02/21      BSA:          1.787 m Patient Age:    71 years       BP:           92/58 mmHg Patient Gender: M               HR:           85 bpm. Exam Location:  [facility] Procedure: 2D Echo, Cardiac Doppler and Color Doppler Indications:     I47.2 Ventricular tachycardia  History:         Patient has prior history of Echocardiogram examinations, most                  recent 08/13/2019. Status post Aortic  Valve Replacement; Risk                  Factors:Hypertension and Diabetes. Coronary artery disease.                  Chronic systolic heart failure.  Sonographer:     Wilford Sports Rodgers-Jones Referring Phys:  [id number] CHRISTOPHER END Diagnosing Phys: Nelva Bush MD IMPRESSIONS  1. Left ventricular ejection fraction, by estimation, is 20 to 25%. The left ventricle has severely decreased function. The left ventricle demonstrates regional wall motion abnormalities (see scoring diagram/findings for description). The left ventricular internal cavity size was moderately dilated. There is mild left ventricular hypertrophy. Left ventricular diastolic parameters are consistent with Grade I diastolic dysfunction (impaired relaxation). Elevated left atrial pressure. There is severe global hypokinesis with relative sparing of the basal segments.  2. Right ventricular systolic function is moderately reduced. The right ventricular size is mildly enlarged. Tricuspid regurgitation signal is inadequate for assessing PA pressure.  3. Left atrial size was mildly dilated.  4. The mitral valve is grossly normal. Mild mitral valve regurgitation. No evidence of mitral stenosis.  5. The aortic valve was not well visualized. Aortic valve regurgitation is not visualized. Echo findings are consistent with normal structure and function of the aortic valve prosthesis.  6. Pulmonic valve regurgitation not well assessed. FINDINGS  Left Ventricle: Left ventricular ejection fraction, by estimation, is 20 to 25%. The left ventricle has severely decreased function. The left ventricle demonstrates regional wall motion abnormalities. The left ventricular internal cavity  size was moderately dilated. There is mild left ventricular hypertrophy. Left ventricular diastolic parameters are consistent with Grade I diastolic dysfunction (impaired relaxation). Elevated left atrial pressure.  LV Wall Scoring: There is severe global hypokinesis with relative sparing of the basal segments. Right Ventricle: The right ventricular size is mildly enlarged. No increase in right ventricular wall thickness. Right ventricular systolic function is moderately reduced. Tricuspid regurgitation signal is inadequate for assessing PA pressure. Left Atrium: Left atrial size was mildly dilated. Right Atrium: Right atrial size was normal in size. Pericardium: There is no evidence of pericardial effusion. Mitral Valve: The mitral valve is grossly normal. Mild mitral valve regurgitation. No evidence of mitral valve stenosis. Tricuspid Valve: The tricuspid valve is not well visualized. Tricuspid valve regurgitation is not demonstrated. Aortic Valve: The aortic valve was not well visualized. Aortic valve regurgitation is not visualized. Aortic valve mean gradient measures 12.0 mmHg. Aortic valve peak gradient measures 17.3 mmHg. There is a 25 mm bioprosthetic valve present in the aortic  position. Echo findings are consistent with normal structure and function of the aortic valve prosthesis. Pulmonic Valve: The pulmonic valve was not well visualized. Pulmonic valve regurgitation not well assessed. Aorta: The aortic root is normal in size and structure. Pulmonary Artery: The pulmonary artery is not well seen. Venous: IVC assessment for right atrial pressure unable to be performed due to mechanical ventilation. IAS/Shunts: The interatrial septum was not well visualized.  LEFT VENTRICLE PLAX 2D LVIDd:         6.36 cm Diastology LVIDs:         5.63 cm LV e' medial:    3.70 cm/s LV PW:         1.12 cm LV E/e' medial:  18.2 LV IVS:        1.09 cm LV e' lateral:   3.59 cm/s  LV E/e' lateral: 18.7   RIGHT VENTRICLE            IVC RV Basal diam:  4.22 cm    IVC diam: 2.14 cm RV S prime:     7.07 cm/s LEFT ATRIUM             Index       RIGHT ATRIUM           Index LA diam:        5.60 cm 3.13 cm/m  RA Area:     17.10 cm LA Vol (A2C):   77.5 ml 43.37 ml/m RA Volume:   49.80 ml  27.87 ml/m LA Vol (A4C):   57.0 ml 31.90 ml/m LA Biplane Vol: 69.5 ml 38.90 ml/m  AORTIC VALVE AV Vmax:           207.80 cm/s AV Vmean:          168.200 cm/s AV VTI:            0.333 m AV Peak Grad:      17.3 mmHg AV Mean Grad:      12.0 mmHg LVOT Vmax:         69.15 cm/s LVOT Vmean:        52.050 cm/s LVOT VTI:          0.099 m LVOT/AV VTI ratio: 0.30  AORTA Ao Root diam: 3.60 cm MITRAL VALVE MV Area (PHT): 3.91 cm     SHUNTS MV Decel Time: 194 msec     Systemic VTI: 0.10 m MV E velocity: 67.30 cm/s MV A velocity: 109.00 cm/s MV E/A ratio:  0.62 Harrell Gave End MD Electronically signed by Nelva Bush MD Signature Date/Time: 01/22/2020/7:17:38 AM    Final      Nutrition Status: Nutrition Problem: Moderate Malnutrition Etiology: chronic illness (CHF) Signs/Symptoms: moderate fat depletion, moderate muscle depletion Interventions: Tube feeding, MVI    Indwelling Urinary Catheter continued, requirement due to   Reason to continue Indwelling Urinary Catheter strict Intake/Output monitoring for hemodynamic instability   Central Line/ continued, requirement due to  Reason to continue [employer] Monitoring of central venous pressure or other hemodynamic parameters and poor IV access   Ventilator continued, requirement due to severe respiratory failure   Ventilator Sedation RASS 0 to -2      ASSESSMENT AND PLAN SYNOPSIS [name] with severe respiratory failure due to metabolic acidosis with severe systolic CHF exacerbation, previous history of COVID-19 in the setting of drug abuse, undergoing therapy for HCAP, complicated by tachyarrhythmia and progressive heart failure. Patient with recurrent respiratory  failure requiring intubation.  Severe ACUTE Hypoxic and Hypercapnic Respiratory Failure - Continue Full MV support - Continue Bronchodilator Therapy - Wean Fio2 and PEEP as tolerated - VAP/VENT bundle implementation - Patient will need trach and PEG tube to survive - Palliative care following  ACUTE SYSTOLIC CARDIAC FAILURE - EF 20-25% - Oxygen as needed - Lasix as tolerated - Follow up cardiac enzymes as indicated - Follow up cardiology recs - Second opinion from [facility] --> recommending repeat echo and cardiac catheterization - Right and Left Heart Cath scheduled 11/19 with Dr. Fletcher Anon, discontinue Eliquis, initiate IV heparin interim  SHOCK-CARDIOGENIC due to tachyarrhythmias - S/p electrical cardioversion - Use vasopressors to keep MAP > 65 - IV amiodarone discontinued due to torsades --> reassessed by EP 11/16, no past evidence of torsades - Transition from IV lidocaine back to IV amiodarone per EP Dr. Quentin Ore - Per cardiology: maintain  potassium > 4.0 and magnesium > 2.0  ACUTE KIDNEY INJURY/Renal Failure - Continue Foley Catheter-assess need - Avoid nephrotoxic agents - Follow urine output, BMP - Ensure adequate renal perfusion, optimize oxygenation - Renal dose medications  NEUROLOGY - Acute toxic metabolic encephalopathy, need for sedation - Goal RASS -2 to -3  CARDIAC - ICU monitoring - Telemetry showing sinus rhythm with PVCs  ID  - Tracheal culture 11/14 positive for MRSA and pseudomonas - Continue IV abx as prescribed  GI - GI PROPHYLAXIS as indicated - DIET --> TF's as tolerated - Constipation protocol as indicated  ENDO - Will use ICU hypoglycemic\Hyperglycemia protocol if indicated  ELECTROLYTES - Follow labs as needed - Replace as needed - Pharmacy consultation and following   DVT/GI PRX ordered and assessed TRANSFUSIONS AS NEEDED MONITOR FSBS I Assessed the need for Labs I Assessed the need for Foley I Assessed the need for Central Venous  Line Family Discussion when available I Assessed the need for Mobilization I made an Assessment of medications to be adjusted accordingly Safety Risk assessment completed   CASE DISCUSSED IN MULTIDISCIPLINARY ROUNDS WITH ICU TEAM  Critical Care Time devoted to patient care services described in this note is 54 minutes.   Overall, patient is critically ill, prognosis is guarded.  Patient with Multiorgan failure and at high risk for cardiac arrest and death.   Follow up [place], plan for cath this week  Corrin Parker, M.D.  [facility]  Medical Director [place] Director [facility] Cardio-Pulmonary Department

## 2020-01-22 NOTE — Progress Notes (Signed)
Progress Note  Patient Name: Travis Maxwell Date of Encounter: 01/22/2020  [facility] Cardiologist: [name] cardiology- Dr. Nehemiah Massed  Subjective   Is intubated, sedated.  On lidocaine drip due to VT, no sustained VT noted over the past 24 hours.  Repeat lidocaine levels pending.  Inpatient Medications    Scheduled Meds: . allopurinol  300 mg Per Tube Daily  . aspirin  81 mg Per Tube Daily  . atorvastatin  40 mg Per Tube QHS  . chlorhexidine  15 mL Mouth Rinse BID  . chlorhexidine gluconate (MEDLINE KIT)  15 mL Mouth Rinse BID  . Chlorhexidine Gluconate Cloth  6 each Topical Daily  . docusate  100 mg Per Tube BID  . feeding supplement (PROSource TF)  45 mL Per Tube Daily  . fentaNYL (SUBLIMAZE) injection  25 mcg Intravenous Once  . furosemide  20 mg Intravenous Daily  . insulin aspart  0-9 Units Subcutaneous Q4H  . linezolid  600 mg Per Tube Q12H  . mouth rinse  15 mL Mouth Rinse 10 times per day  . metoprolol succinate  25 mg Oral Daily  . polyethylene glycol  17 g Per Tube Daily  . senna-docusate  1 tablet Per Tube BID  . sodium chloride flush  10-40 mL Intracatheter Q12H  . sodium chloride flush  3 mL Intravenous Q12H   Continuous Infusions: . sodium chloride 10 mL/hr at 01/12/20 1915  . sodium chloride 250 mL (01/20/20 0907)  . ceFEPime (MAXIPIME) IV Stopped (01/22/20 0631)  . famotidine (PEPCID) IV 20 mg (01/22/20 0918)  . feeding supplement (VITAL AF 1.2 CAL) 60 mL/hr at 01/21/20 1600  . fentaNYL infusion INTRAVENOUS 100 mcg/hr (01/22/20 0800)  . heparin 1,050 Units/hr (01/22/20 0921)  . lidocaine 2 mg/min (01/22/20 1129)  . midazolam 4 mg/hr (01/22/20 1051)  . phenylephrine (NEO-SYNEPHRINE) Adult infusion 70 mcg/min (01/22/20 1045)   PRN Meds: sodium chloride, acetaminophen, albuterol, diazepam, fentaNYL, haloperidol lactate, metoprolol tartrate, ondansetron (ZOFRAN) IV, oxyCODONE, polyethylene glycol, sodium chloride flush, sodium chloride flush   Vital Signs     Vitals:   01/22/20 1108 01/22/20 1115 01/22/20 1130 01/22/20 1145  BP:  118/79 124/89 105/72  Pulse:  79 80 80  Resp:  16 19 20   Temp:  98.8 F (37.1 C) 98.8 F (37.1 C) 98.8 F (37.1 C)  TempSrc:      SpO2: 98% 98% 99% 97%  Weight:      Height:        Intake/Output Summary (Last 24 hours) at 01/22/2020 1234 Last data filed at 01/22/2020 0930 Gross per 24 hour  Intake 2529.42 ml  Output 1090 ml  Net 1439.42 ml   Last 3 Weights 01/22/2020 01/20/2020 01/19/2020  Weight (lbs) 162 lb 14.7 oz 149 lb 7.6 oz 151 lb 7.3 oz  Weight (kg) 73.9 kg 67.8 kg 68.7 kg      Telemetry    Sinus rhythm, occasional PVCs- Personally Reviewed  ECG   EKG ordered, pending- Personally Reviewed  Physical Exam   GEN:  Intubated Neck: No JVD Cardiac: RRR Respiratory:  Vented breath sounds GI: Soft, nontender, non-distended  MS: No edema; No deformity. Neuro:  Intubated, sedated Psych: Intubated, sedated  Labs    High Sensitivity Troponin:   Recent Labs  Lab 01/09/20 0152 01/16/20 0245 01/16/20 0702 01/16/20 0838 01/19/20 0433  TROPONINIHS 89* 934* 711* 674* 234*      Chemistry Recent Labs  Lab 01/16/20 1900 01/17/20 0430 01/20/20 0433 01/21/20 0409 01/22/20 0414  NA  138   < > 137 139 131*  K 3.4*   < > 4.0 4.4 3.9  CL 101   < > 106 107 101  CO2 23   < > 22 24 24   GLUCOSE 165*   < > 317* 202* 360*  BUN 24*   < > 24* 29* 29*  CREATININE 0.92   < > 1.09 1.07 0.83  CALCIUM 8.7*   < > 8.1* 8.7* 7.7*  PROT 8.3*  --   --   --   --   ALBUMIN 3.2*  --   --   --   --   AST 29  --   --   --   --   ALT 13  --   --   --   --   ALKPHOS 77  --   --   --   --   BILITOT 1.1  --   --   --   --   GFRNONAA >60   < > >60 >60 >60  ANIONGAP 14   < > 9 8 6    < > = values in this interval not displayed.     Hematology Recent Labs  Lab 01/20/20 0433 01/21/20 0409 01/22/20 0414  WBC 21.0* 20.7* 19.1*  RBC 7.32* 7.56* 6.74*  HGB 15.0 15.6 13.9  HCT 50.1 52.7* 47.0  MCV  68.4* 69.7* 69.7*  MCH 20.5* 20.6* 20.6*  MCHC 29.9* 29.6* 29.6*  RDW 25.3* 26.0* 26.1*  PLT 932* 876* 874*    BNPNo results for input(s): BNP, PROBNP in the last 168 hours.   DDimer No results for input(s): DDIMER in the last 168 hours.   Radiology    DG Abd 1 View  Result Date: 01/20/2020 CLINICAL DATA:  69 year old male status post NG placement. EXAM: ABDOMEN - 1 VIEW COMPARISON:  Abdominal radiograph dated 01/19/2020. FINDINGS: Enteric tube with tip and side-port in the left upper abdomen. The tip of the enteric tube is likely at the gastric fundus. Air is noted throughout the colon. Cardiomegaly with mechanical cardiac valve. IMPRESSION: Enteric tube with tip likely in the region of the gastric fundus. Electronically Signed   By: Anner Crete M.D.   On: 01/20/2020 15:57   ECHOCARDIOGRAM COMPLETE  Result Date: 01/22/2020    ECHOCARDIOGRAM REPORT   Patient Name:   Travis Maxwell Date of Exam: 01/21/2020 Medical Rec #:  [id number]      Height:       67.0 in Accession #:    [id number]     Weight:       149.5 lb Date of Birth:  1951/02/07      BSA:          1.787 m Patient Age:    31 years       BP:           92/58 mmHg Patient Gender: M              HR:           85 bpm. Exam Location:  [facility] Procedure: 2D Echo, Cardiac Doppler and Color Doppler Indications:     I47.2 Ventricular tachycardia  History:         Patient has prior history of Echocardiogram examinations, most                  recent 08/13/2019. Status post Aortic Valve Replacement; Risk  Factors:Hypertension and Diabetes. Coronary artery disease.                  Chronic systolic heart failure.  Sonographer:     Wilford Sports Rodgers-Jones Referring Phys:  [id number] CHRISTOPHER END Diagnosing Phys: Nelva Bush MD IMPRESSIONS  1. Left ventricular ejection fraction, by estimation, is 20 to 25%. The left ventricle has severely decreased function. The left ventricle demonstrates regional wall motion abnormalities (see scoring  diagram/findings for description). The left ventricular internal cavity size was moderately dilated. There is mild left ventricular hypertrophy. Left ventricular diastolic parameters are consistent with Grade I diastolic dysfunction (impaired relaxation). Elevated left atrial pressure. There is severe global hypokinesis with relative sparing of the basal segments.  2. Right ventricular systolic function is moderately reduced. The right ventricular size is mildly enlarged. Tricuspid regurgitation signal is inadequate for assessing PA pressure.  3. Left atrial size was mildly dilated.  4. The mitral valve is grossly normal. Mild mitral valve regurgitation. No evidence of mitral stenosis.  5. The aortic valve was not well visualized. Aortic valve regurgitation is not visualized. Echo findings are consistent with normal structure and function of the aortic valve prosthesis.  6. Pulmonic valve regurgitation not well assessed. FINDINGS  Left Ventricle: Left ventricular ejection fraction, by estimation, is 20 to 25%. The left ventricle has severely decreased function. The left ventricle demonstrates regional wall motion abnormalities. The left ventricular internal cavity size was moderately dilated. There is mild left ventricular hypertrophy. Left ventricular diastolic parameters are consistent with Grade I diastolic dysfunction (impaired relaxation). Elevated left atrial pressure.  LV Wall Scoring: There is severe global hypokinesis with relative sparing of the basal segments. Right Ventricle: The right ventricular size is mildly enlarged. No increase in right ventricular wall thickness. Right ventricular systolic function is moderately reduced. Tricuspid regurgitation signal is inadequate for assessing PA pressure. Left Atrium: Left atrial size was mildly dilated. Right Atrium: Right atrial size was normal in size. Pericardium: There is no evidence of pericardial effusion. Mitral Valve: The mitral valve is grossly  normal. Mild mitral valve regurgitation. No evidence of mitral valve stenosis. Tricuspid Valve: The tricuspid valve is not well visualized. Tricuspid valve regurgitation is not demonstrated. Aortic Valve: The aortic valve was not well visualized. Aortic valve regurgitation is not visualized. Aortic valve mean gradient measures 12.0 mmHg. Aortic valve peak gradient measures 17.3 mmHg. There is a 25 mm bioprosthetic valve present in the aortic  position. Echo findings are consistent with normal structure and function of the aortic valve prosthesis. Pulmonic Valve: The pulmonic valve was not well visualized. Pulmonic valve regurgitation not well assessed. Aorta: The aortic root is normal in size and structure. Pulmonary Artery: The pulmonary artery is not well seen. Venous: IVC assessment for right atrial pressure unable to be performed due to mechanical ventilation. IAS/Shunts: The interatrial septum was not well visualized.  LEFT VENTRICLE PLAX 2D LVIDd:         6.36 cm Diastology LVIDs:         5.63 cm LV e' medial:    3.70 cm/s LV PW:         1.12 cm LV E/e' medial:  18.2 LV IVS:        1.09 cm LV e' lateral:   3.59 cm/s                        LV E/e' lateral: 18.7  RIGHT VENTRICLE  IVC RV Basal diam:  4.22 cm    IVC diam: 2.14 cm RV S prime:     7.07 cm/s LEFT ATRIUM             Index       RIGHT ATRIUM           Index LA diam:        5.60 cm 3.13 cm/m  RA Area:     17.10 cm LA Vol (A2C):   77.5 ml 43.37 ml/m RA Volume:   49.80 ml  27.87 ml/m LA Vol (A4C):   57.0 ml 31.90 ml/m LA Biplane Vol: 69.5 ml 38.90 ml/m  AORTIC VALVE AV Vmax:           207.80 cm/s AV Vmean:          168.200 cm/s AV VTI:            0.333 m AV Peak Grad:      17.3 mmHg AV Mean Grad:      12.0 mmHg LVOT Vmax:         69.15 cm/s LVOT Vmean:        52.050 cm/s LVOT VTI:          0.099 m LVOT/AV VTI ratio: 0.30  AORTA Ao Root diam: 3.60 cm MITRAL VALVE MV Area (PHT): 3.91 cm     SHUNTS MV Decel Time: 194 msec     Systemic VTI:  0.10 m MV E velocity: 67.30 cm/s MV A velocity: 109.00 cm/s MV E/A ratio:  0.62 Harrell Gave End MD Electronically signed by Nelva Bush MD Signature Date/Time: 01/22/2020/7:17:38 AM    Final     Cardiac Studies   Echocardiogram (08/13/2019): 1. Left ventricular ejection fraction, by estimation, is <20%. The left  ventricle has severely decreased function. The left ventricle demonstrates  global hypokinesis. The left ventricular internal cavity size was severely  dilated. There is mild left  ventricular hypertrophy. Left ventricular diastolic parameters were  normal.  2. Right ventricular systolic function is normal. The right ventricular  size is normal.  3. Left atrial size was moderately dilated.  4. Right atrial size was mildly dilated.  5. The mitral valve is normal in structure. Moderate to severe mitral  valve regurgitation.  6. Tricuspid valve regurgitation is mild to moderate.  7. The aortic valve is normal in structure. Aortic valve regurgitation is  mild.   Patient Profile     69 y.o. male history of CAD/PCI to RCA 2019, NICM EF <20%, tobacco use, diabetes being seen for ventricular tachycardia and heart failure  Assessment & Plan    1. HFrEF -Avoiding additional CHF meds due to hypotension -Plan for right and left heart cath Friday due to persistent VT. -Toprol-XL 25 mg daily -Seems to be gaining weight, will give a dose of Lasix 25m iv x1.   2.  VT -Input from EP appreciated -Lidocaine level 7.2. -Decrease lidocaine drip from 2 mg to 1 mg/min -Start amnio drip -Previously noted QT prolongation-QT appears normal when taking into account right bundle branch block. ekg ordered.  3.  Paroxysmal atrial fibrillation -Currently in sinus rhythm -Heparin drip -cont Toprol-XL  4.  Respiratory failure -Intubated, sedated -Management as per critical care team  Patient is critically ill.  Critical care time was exclusive of separate billable procedures and  treating other patients.  Critial care time was spent personally by me (independant of midlevel providers) on the following activities: development of treatment plan,  evaluation of  patients response to treatment, examining patient, reviewing treatments/ interventions, lab studies, radiographic studies, pulse ox, and re-evaluation of patients condition.      The patient is critically ill with multiple organ systems failure and requires high complexity decision making for assessment and support, frequent evaluation and titration of therapies, application of advanced monitoring technologies and extensive interpretation of databases.   Critical care was necessary to treat or prevent immintent or life-threatening deterioration.   Total CCT spent directly with the patient today is 35     Signed, Kate Sable, MD  01/22/2020, 12:34 PM

## 2020-01-23 ENCOUNTER — Inpatient Hospital Stay: Payer: Medicare HMO

## 2020-01-23 DIAGNOSIS — I472 Ventricular tachycardia: Secondary | ICD-10-CM | POA: Diagnosis not present

## 2020-01-23 DIAGNOSIS — I4891 Unspecified atrial fibrillation: Secondary | ICD-10-CM

## 2020-01-23 DIAGNOSIS — I5023 Acute on chronic systolic (congestive) heart failure: Secondary | ICD-10-CM | POA: Diagnosis not present

## 2020-01-23 DIAGNOSIS — J9602 Acute respiratory failure with hypercapnia: Secondary | ICD-10-CM

## 2020-01-23 DIAGNOSIS — J9601 Acute respiratory failure with hypoxia: Secondary | ICD-10-CM | POA: Diagnosis not present

## 2020-01-23 LAB — CBC WITH DIFFERENTIAL/PLATELET
Abs Immature Granulocytes: 0.29 10*3/uL — ABNORMAL HIGH (ref 0.00–0.07)
Basophils Absolute: 0.2 10*3/uL — ABNORMAL HIGH (ref 0.0–0.1)
Basophils Relative: 1 %
Eosinophils Absolute: 0.7 10*3/uL — ABNORMAL HIGH (ref 0.0–0.5)
Eosinophils Relative: 4 %
HCT: 49.9 % (ref 39.0–52.0)
Hemoglobin: 14.7 g/dL (ref 13.0–17.0)
Immature Granulocytes: 2 %
Lymphocytes Relative: 9 %
Lymphs Abs: 1.5 10*3/uL (ref 0.7–4.0)
MCH: 20.5 pg — ABNORMAL LOW (ref 26.0–34.0)
MCHC: 29.5 g/dL — ABNORMAL LOW (ref 30.0–36.0)
MCV: 69.7 fL — ABNORMAL LOW (ref 80.0–100.0)
Monocytes Absolute: 1.7 10*3/uL — ABNORMAL HIGH (ref 0.1–1.0)
Monocytes Relative: 10 %
Neutro Abs: 13 10*3/uL — ABNORMAL HIGH (ref 1.7–7.7)
Neutrophils Relative %: 74 %
Platelets: 968 10*3/uL (ref 150–400)
RBC: 7.16 MIL/uL — ABNORMAL HIGH (ref 4.22–5.81)
RDW: 26.9 % — ABNORMAL HIGH (ref 11.5–15.5)
Smear Review: INCREASED
WBC: 17.5 10*3/uL — ABNORMAL HIGH (ref 4.0–10.5)
nRBC: 0 % (ref 0.0–0.2)

## 2020-01-23 LAB — BASIC METABOLIC PANEL
Anion gap: 11 (ref 5–15)
BUN: 27 mg/dL — ABNORMAL HIGH (ref 8–23)
CO2: 21 mmol/L — ABNORMAL LOW (ref 22–32)
Calcium: 8.3 mg/dL — ABNORMAL LOW (ref 8.9–10.3)
Chloride: 106 mmol/L (ref 98–111)
Creatinine, Ser: 0.82 mg/dL (ref 0.61–1.24)
GFR, Estimated: >60 mL/min (ref >60)
Glucose, Bld: 203 mg/dL — ABNORMAL HIGH (ref 70–99)
Potassium: 4.2 mmol/L (ref 3.5–5.1)
Sodium: 138 mmol/L (ref 135–145)

## 2020-01-23 LAB — APTT
aPTT: 137 seconds — ABNORMAL HIGH (ref 24–36)
aPTT: 151 seconds — ABNORMAL HIGH (ref 24–36)
aPTT: 76 seconds — ABNORMAL HIGH (ref 24–36)
aPTT: 89 seconds — ABNORMAL HIGH (ref 24–36)

## 2020-01-23 LAB — GLUCOSE, CAPILLARY
Glucose-Capillary: 187 mg/dL — ABNORMAL HIGH (ref 70–99)
Glucose-Capillary: 195 mg/dL — ABNORMAL HIGH (ref 70–99)
Glucose-Capillary: 194 mg/dL — ABNORMAL HIGH (ref 70–99)
Glucose-Capillary: 175 mg/dL — ABNORMAL HIGH (ref 70–99)
Glucose-Capillary: 204 mg/dL — ABNORMAL HIGH (ref 70–99)
Glucose-Capillary: 176 mg/dL — ABNORMAL HIGH (ref 70–99)

## 2020-01-23 LAB — MAGNESIUM
Magnesium: 1.7 mg/dL (ref 1.7–2.4)
Magnesium: 1.7 mg/dL (ref 1.7–2.4)
Magnesium: 2.3 mg/dL (ref 1.7–2.4)

## 2020-01-23 LAB — HEPARIN LEVEL (UNFRACTIONATED)
Heparin Unfractionated: 1.31 IU/mL — ABNORMAL HIGH (ref 0.30–0.70)
Heparin Unfractionated: 0.98 IU/mL — ABNORMAL HIGH (ref 0.30–0.70)

## 2020-01-23 LAB — TRIGLYCERIDES: Triglycerides: 55 mg/dL (ref <150)

## 2020-01-23 LAB — PHOSPHORUS: Phosphorus: 2.7 mg/dL (ref 2.5–4.6)

## 2020-01-23 MED ORDER — HEPARIN (PORCINE) 25000 UT/250ML-% IV SOLN
1100.0000 [IU]/h | INTRAVENOUS | Status: DC
  Administered 2020-01-23: 1100 [IU]/h via INTRAVENOUS
  Filled 2020-01-23: qty 250

## 2020-01-23 MED ORDER — SCOPOLAMINE 1 MG/3DAYS TD PT72
1.0000 | MEDICATED_PATCH | TRANSDERMAL | Status: DC
  Administered 2020-01-23 – 2020-02-01 (×4): 1.5 mg via TRANSDERMAL
  Filled 2020-01-23 (×4): qty 1

## 2020-01-23 MED ORDER — METOPROLOL TARTRATE 25 MG PO TABS
25.0000 mg | ORAL_TABLET | Freq: Two times a day (BID) | ORAL | Status: DC
  Filled 2020-01-23: qty 1

## 2020-01-23 MED ORDER — SODIUM CHLORIDE 0.9% FLUSH
3.0000 mL | Freq: Two times a day (BID) | INTRAVENOUS | Status: DC
  Administered 2020-01-24 – 2020-02-19 (×28): 3 mL via INTRAVENOUS

## 2020-01-23 MED ORDER — MAGNESIUM SULFATE 2 GM/50ML IV SOLN
2.0000 g | Freq: Once | INTRAVENOUS | Status: AC
  Administered 2020-01-23: 2 g via INTRAVENOUS
  Filled 2020-01-23: qty 50

## 2020-01-23 MED ORDER — METOPROLOL TARTRATE 5 MG/5ML IV SOLN
5.0000 mg | INTRAVENOUS | Status: DC | PRN
  Administered 2020-01-25: 5 mg via INTRAVENOUS
  Filled 2020-01-23: qty 5

## 2020-01-23 NOTE — Progress Notes (Addendum)
ANTICOAGULATION CONSULT NOTE   Pharmacy Consult for heparin Indication: atrial fibrillation  Allergies  Allergen Reactions  . Penicillins Other (See Comments)    Has patient had a PCN reaction causing immediate rash, facial/tongue/throat swelling, SOB or lightheadedness with hypotension: Unknown Has patient had a PCN reaction causing severe rash involving mucus membranes or skin necrosis: Unknown Has patient had a PCN reaction that required hospitalization: Unknown Has patient had a PCN reaction occurring within the last 10 years: No If all of the above answers are "NO", then may proceed with Cephalosporin use.     Patient Measurements: Height: 5\' 7"  (170.2 cm) Weight: 73.9 kg (162 lb 14.7 oz) IBW/kg (Calculated) : 66.1 Heparin Dosing Weight: 67 kg  Vital Signs: Temp: 98.4 F (36.9 C) (11/18 0000) Temp Source: Rectal (11/18 0000) BP: 102/67 (11/18 0000) Pulse Rate: 70 (11/18 0000)  Labs: Recent Labs    01/20/20 0433 01/20/20 0433 01/21/20 0409 01/22/20 0414 01/22/20 1610 01/23/20 0008  HGB 15.0   < > 15.6 13.9  --   --   HCT 50.1  --  52.7* 47.0  --   --   PLT 932*  --  876* 874*  --   --   APTT  --   --   --  65* 53* 137*  HEPARINUNFRC  --   --   --  1.41*  --   --   CREATININE 1.09   < > 1.07 0.83 0.88  --    < > = values in this interval not displayed.    Estimated Creatinine Clearance: 76.2 mL/min (by C-G formula based on SCr of 0.88 mg/dL).   Medical History: Past Medical History:  Diagnosis Date  . CAD (coronary artery disease)   . Chronic systolic (congestive) heart failure (HCC)   . Diabetes mellitus without complication (HCC)   . GERD (gastroesophageal reflux disease)   . Gout   . Hx of aortic valve replacement    Bioprosthetic valve  . Hypertension       Assessment: 69 year old male with h/o CAD, HFrEF, aortic regurgitation and stenosis s/p bioprosthetic aortic valve, afib on Eliquis. Patient with episodes of ventricular tachycardia  requiring defibrillation multiple times. Patient has been on Eliquis here. Plan is for heart cath Friday after appropriate Eliquis washout. Plan to start heparin in interim.  11/17 0414 aPTT 65 11/17 1610 aPTT 53 Subtherapeutic 11/18 0008 aPTT 137   Goal of Therapy:  Heparin level 0.3-0.7 units/ml aPTT 66-102 seconds Monitor platelets by anticoagulation protocol: Yes   Plan:  Ordered additional bolus of 2200 unit and increased infusion to 1300 unit/hr. Plan to follow aPTT as HL will be elevated in setting of recent Eliquis use. Next aPTT ordered for 11/18 @ 0000 and daily CBC and HL ordered for tomorrow AM while on heparin drip.  11/18:  APTT @ 0008 = 137,  APTT more than doubled in 8 hrs. Spoke with RN who said she drew sample from from central line.  Heparin also infusing in central line. RN stated she flushed line before obtaining sample but still may have falsely elevated result.  Will repeat aPTT but have phlebotomist obtain sample thru peripheral line.   11/18: APTT @ 0138 = 151 APTT remains elevated despite being drawn via peripheral by phlebotomist.  Will hold heparin gtt for 1 hr and restart @ 1100 units/hr.  Will recheck aPTT 6 hrs after restart.  Sara Selvidge D 01/23/2020 1:45 AM

## 2020-01-23 NOTE — Progress Notes (Signed)
PHARMACY CONSULT NOTE - FOLLOW UP  Pharmacy Consult for Electrolyte Monitoring and Replacement   Recent Labs: Potassium (mmol/L)  Date Value  01/23/2020 4.2   Magnesium (mg/dL)  Date Value  84/16/6063 1.7   Calcium (mg/dL)  Date Value  01/60/1093 8.3 (L)   Albumin (g/dL)  Date Value  23/55/7322 3.2 (L)   Phosphorus (mg/dL)  Date Value  02/54/2706 2.7   Sodium (mmol/L)  Date Value  01/23/2020 138   Assessment: 69 year old male transferred to ICU for acute toxic metabolic encephalopathy. Patient now more alert and awake. Overnight 11/10, patient with two runs pulseless VT, with second episode requiring CPR and defibrillation. Converted back into baseline bundle branch rhythm with rate in 50's-60's.  Pt received magnesium 2 gm during episode. Similar episodes 11/11 requiring defibrillation. Pharmacy to manage electrolytes.  Patient intubated 11/14.  Goal of Therapy:  Electrolytes WNL  Plan:  Electrolytes stable. Will give mag 2 g IV for goal ~ 2. Continue to follow along.  Pricilla Riffle ,PharmD Clinical Pharmacist 01/23/2020 1:22 PM

## 2020-01-23 NOTE — Progress Notes (Signed)
   I was able to connect with the patient's dtr, [name] @ [phone].  She is listed as the patient's primary contact.  I updated her on his cardiovascular status and plans for diagnostic catheterization tomorrow.  She understands that risks include but are not limited to stroke (1 in 1000), death (1 in 1000), kidney failure [usually temporary] (1 in 500), bleeding (1 in 200), allergic reaction [possibly serious] (1 in 200), and wishes for [place] to proceed as planned.  All questions answered.  Dtr was grateful for contact.   Nicolasa Ducking, NP 01/23/2020, 12:06 PM

## 2020-01-23 NOTE — Progress Notes (Signed)
Progress Note  Patient Name: Travis Maxwell Date of Encounter: 01/23/2020  Primary Cardiologist: Kathlyn Sacramento, MD  Subjective   Intubated/sedated.  No VT past 24 hrs.    Inpatient Medications    Scheduled Meds: . allopurinol  300 mg Per Tube Daily  . aspirin  81 mg Per Tube Daily  . atorvastatin  40 mg Per Tube QHS  . chlorhexidine  15 mL Mouth Rinse BID  . chlorhexidine gluconate (MEDLINE KIT)  15 mL Mouth Rinse BID  . Chlorhexidine Gluconate Cloth  6 each Topical Daily  . docusate  100 mg Per Tube BID  . feeding supplement (PROSource TF)  45 mL Per Tube Daily  . fentaNYL (SUBLIMAZE) injection  25 mcg Intravenous Once  . furosemide  20 mg Intravenous Daily  . insulin aspart  0-9 Units Subcutaneous Q4H  . linezolid  600 mg Per Tube Q12H  . mouth rinse  15 mL Mouth Rinse 10 times per day  . metoprolol succinate  25 mg Oral Daily  . polyethylene glycol  17 g Per Tube Daily  . senna-docusate  1 tablet Per Tube BID  . sodium chloride flush  10-40 mL Intracatheter Q12H  . sodium chloride flush  3 mL Intravenous Q12H   Continuous Infusions: . sodium chloride 10 mL/hr at 01/12/20 1915  . sodium chloride 10 mL/hr at 01/23/20 0228  . amiodarone 30 mg/hr (01/23/20 0400)  . ceFEPime (MAXIPIME) IV 2 g (01/23/20 0516)  . famotidine (PEPCID) IV Stopped (01/22/20 0948)  . feeding supplement (VITAL AF 1.2 CAL) 1,000 mL (01/23/20 0305)  . fentaNYL infusion INTRAVENOUS 150 mcg/hr (01/23/20 0400)  . heparin 1,100 Units/hr (01/23/20 0400)  . lidocaine 1 mg/min (01/23/20 0051)  . midazolam 4 mg/hr (01/23/20 0400)  . phenylephrine (NEO-SYNEPHRINE) Adult infusion 40 mcg/min (01/23/20 0400)   PRN Meds: sodium chloride, acetaminophen, albuterol, diazepam, fentaNYL, haloperidol lactate, metoprolol tartrate, ondansetron (ZOFRAN) IV, oxyCODONE, polyethylene glycol, sodium chloride flush, sodium chloride flush   Vital Signs    Vitals:   01/23/20 0000 01/23/20 0235 01/23/20 0337 01/23/20  0400  BP: 102/67   106/66  Pulse: 70   70  Resp: 20   20  Temp: 98.4 F (36.9 C)   98.6 F (37 C)  TempSrc: Rectal   Rectal  SpO2: 98% 99%  98%  Weight:   72.6 kg   Height:        Intake/Output Summary (Last 24 hours) at 01/23/2020 0755 Last data filed at 01/23/2020 0400 Gross per 24 hour  Intake 2931.24 ml  Output 1570 ml  Net 1361.24 ml   Filed Weights   01/20/20 0351 01/22/20 0400 01/23/20 0337  Weight: 67.8 kg 73.9 kg 72.6 kg    Physical Exam   GEN: Well nourished, well developed, in no acute distress. Intubated/sedated.  HEENT: Grossly normal.  Neck: Supple, no JVD, carotid bruits, or masses. Cardiac: RRR, distant, 2/6 syst murmur along left sternal border no rubs, or gallops. No clubbing, cyanosis, edema.  Radials/DP/PT 2+ and equal bilaterally.  Respiratory:  Respirations regular and unlabored, ventilated breath sounds. GI: Soft, nontender, nondistended, BS + x 4. MS: no deformity or atrophy. Skin: warm and dry, no rash. Neuro:  Sedated. Opens eyes briefly to name/touch. Psych: Sedated.  Labs    Chemistry Recent Labs  Lab 01/16/20 1900 01/17/20 0430 01/22/20 0414 01/22/20 1610 01/23/20 0138  NA 138   < > 131* 138 138  K 3.4*   < > 3.9 4.3 4.2  CL 101   < >  101 104 106  CO2 23   < > 24 23 21*  GLUCOSE 165*   < > 360* 214* 203*  BUN 24*   < > 29* 28* 27*  CREATININE 0.92   < > 0.83 0.88 0.82  CALCIUM 8.7*   < > 7.7* 8.4* 8.3*  PROT 8.3*  --   --   --   --   ALBUMIN 3.2*  --   --   --   --   AST 29  --   --   --   --   ALT 13  --   --   --   --   ALKPHOS 77  --   --   --   --   BILITOT 1.1  --   --   --   --   GFRNONAA >60   < > >60 >60 >60  ANIONGAP 14   < > _0 < > = values in this interval not displayed.     Hematology Recent Labs  Lab 01/21/20 0409 01/22/20 0414 01/23/20 0138  WBC 20.7* 19.1* 17.5*  RBC 7.56* 6.74* 7.16*  HGB 15.6 13.9 14.7  HCT 52.7* 47.0 49.9  MCV 69.7* 69.7* 69.7*  MCH 20.6* 20.6* 20.5*  MCHC 29.6* 29.6*  29.5*  RDW 26.0* 26.1* 26.9*  PLT 876* 874* 968*    Cardiac Enzymes  Recent Labs  Lab 01/09/20 0152 01/16/20 0245 01/16/20 0702 01/16/20 0838 01/19/20 0433  TROPONINIHS 89* 934* 711* 674* 234*      Lipids  Lab Results  Component Value Date   CHOL 106 12/01/2019   HDL 42 12/01/2019   LDLCALC 57 12/01/2019   TRIG 55 01/23/2020   CHOLHDL 2.5 12/01/2019    HbA1c  Lab Results  Component Value Date   HGBA1C 6.4 (H) 11/30/2019    Radiology    DG Abd 1 View  Result Date: 01/20/2020 CLINICAL DATA:  69 year old male status post NG placement. EXAM: ABDOMEN - 1 VIEW COMPARISON:  Abdominal radiograph dated 01/19/2020. FINDINGS: Enteric tube with tip and side-port in the left upper abdomen. The tip of the enteric tube is likely at the gastric fundus. Air is noted throughout the colon. Cardiomegaly with mechanical cardiac valve. IMPRESSION: Enteric tube with tip likely in the region of the gastric fundus. Electronically Signed   By: Anner Crete M.D.   On: 01/20/2020 15:57   DG Chest Port 1 View  Result Date: 01/23/2020 CLINICAL DATA:  Endotracheal tube present. EXAM: PORTABLE CHEST 1 VIEW COMPARISON:  01/19/2020 FINDINGS: Endotracheal tube is 2.2 cm above the carina. Nasogastric tube extends into the upper abdomen and terminates in the gastric fundus region. Heart size is mildly enlarged but unchanged. Right arm PICC terminates in the SVC region. Patient has a prosthetic heart valve and previous median sternotomy. New patchy densities at the right lung base. Negative for pneumothorax. Slightly decreased lung volumes. 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 IMPRESSION: 1. New patchy densities at the right lung base. Findings could represent atelectasis or developing infection. Upper lungs are clear. 2. Endotracheal tube is appropriately positioned. Electronically Signed   By: Markus Daft M.D.   On: 01/23/2020 07:48   DG Chest  Port 1 View  Result Date: 01/19/2020 CLINICAL DATA:  OG tube placement. EXAM: PORTABLE CHEST 1 VIEW COMPARISON:  Earlier same day FINDINGS: 0820 hours. Leftward patient rotation. The cardio pericardial silhouette is enlarged. NG tube/OG tube is  positioned in the proximal stomach with proximal side port just above the expected location of the esophagogastric junction. IMPRESSION: NG tube tip projects over the proximal stomach. Electronically Signed   By: Misty Stanley M.D.   On: 01/19/2020 09:21    Telemetry    RSR, PVC's - Personally Reviewed QTc 470  Cardiac Studies     Cath and PCI 07/25/2017  Mid RCA lesion is 99% stenosed.  Post intervention, there is a 0% residual stenosis.  A drug-eluting stent was successfully placed using a STENT RESOLUTE ONYX 2.0X12. Conclusion Successful PCI and stent of nondominant mid right with DES stent 2.0 x 12 mm resolute to 12 atm Lesion reduced from 99 down to 0% TIMI-3 flow before and after procedure _____________   Echo 08/13/19 1. Left ventricular ejection fraction, by estimation, is <20%. The left  ventricle has severely decreased function. The left ventricle demonstrates  global hypokinesis. The left ventricular internal cavity size was severely  dilated. There is mild left  ventricular hypertrophy. Left ventricular diastolic parameters were  normal.  2. Right ventricular systolic function is normal. The right ventricular  size is normal.  3. Left atrial size was moderately dilated.  4. Right atrial size was mildly dilated.  5. The mitral valve is normal in structure. Moderate to severe mitral  valve regurgitation.  6. Tricuspid valve regurgitation is mild to moderate.  7. The aortic valve is normal in structure. Aortic valve regurgitation is  mild.  _____________  2D Echocardiogram 01/2020  1. Left ventricular ejection fraction, by estimation, is 20 to 25%. The  left ventricle has severely decreased function. The left  ventricle  demonstrates regional wall motion abnormalities (see scoring  diagram/findings for description). The left  ventricular internal cavity size was moderately dilated. There is mild  left ventricular hypertrophy. Left ventricular diastolic parameters are  consistent with Grade I diastolic dysfunction (impaired relaxation).  Elevated left atrial pressure. There is  severe global hypokinesis with relative sparing of the basal segments.  2. Right ventricular systolic function is moderately reduced. The right  ventricular size is mildly enlarged. Tricuspid regurgitation signal is  inadequate for assessing PA pressure.  3. Left atrial size was mildly dilated.  4. The mitral valve is grossly normal. Mild mitral valve regurgitation.  No evidence of mitral stenosis.  5. The aortic valve was not well visualized. Aortic valve regurgitation  is not visualized. Echo findings are consistent with normal structure and  function of the aortic valve prosthesis.  6. Pulmonic valve regurgitation not well assessed.  _____________    Patient Profile     69 y.o. 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  Assessment & Plan    1.  Ventricular tachycardia arrest: Patient with multiple episodes of VT during admission requiring defibrillation and antiarrhythmic therapy.  Seen by EP with recommendation to repeat lidocaine level and add  amiodarone.  Lidocaine level elevated 7.2 on November 16 and this was reduced to 1 mg/min.  Amiodarone subsequently added.  No recurrent VT in the past 24 hours.  Repeat amiodarone level pending this morning.  QTC stable at 470.  Magnesium is 1.7 and will order supplementation to maintain at 2.  Potassium stable at 4.2.  Setting of VT and cardiomyopathy, plan for right left heart diagnostic catheterization tomorrow. I have left a message for his dtr to discuss consent.  2.  Acute on Chronic heart failure with reduced ejection fraction/mixed ischemic and nonischemic cardiomyopathy: LV dysfunction historically of proportion to degree of CAD.  EF 20-25%, with severe global hypokinesis and mild LVH.  He is net positive for admission in the setting of ongoing hypotension requiring vasopressor therapy.  He does not appear to be volume overloaded on examination.  Reasonable to transduce CVP if port available otherwise, though planning on right heart cath tomorrow anyway.  Given ongoing hypotension requiring vasopressor therapy, I would have a low threshold to d/c  blocker therapy.  Hypertension also limits our ability to use acei/arb/arni/mra.  Remains on lasix 20 IV daily.  3.  PAF:  Maintaining sinus.  Currently receiving  blocker, amio, and lido in setting of above.  With ongoing hypotn req vasopressor rx and AAD use, we can likely d/c  blocker - will d/w Dr. Rockey Situ.  Remains on heparin gtt.  4. Acute hypoxic resp failure:  Remains intubated/sedated.  Per CCM. Trach culture 11/14 + for MRSA and pseudomonas  abx per CCM.  Signed, Murray Hodgkins, NP  01/23/2020, 7:55 AM    For questions or updates, please contact   Please consult [email or web address] for contact info under Cardiology/STEMI.

## 2020-01-23 NOTE — Progress Notes (Signed)
ANTICOAGULATION CONSULT NOTE   Pharmacy Consult for heparin Indication: atrial fibrillation  Allergies  Allergen Reactions   Penicillins Other (See Comments)    Has patient had a PCN reaction causing immediate rash, facial/tongue/throat swelling, SOB or lightheadedness with hypotension: Unknown Has patient had a PCN reaction causing severe rash involving mucus membranes or skin necrosis: Unknown Has patient had a PCN reaction that required hospitalization: Unknown Has patient had a PCN reaction occurring within the last 10 years: No If all of the above answers are "NO", then may proceed with Cephalosporin use.     Patient Measurements: Height: 5\' 7"  (170.2 cm) Weight: 72.6 kg (160 lb 0.9 oz) IBW/kg (Calculated) : 66.1 Heparin Dosing Weight: 67 kg  Vital Signs: Temp: 98.8 F (37.1 C) (11/18 1000) Temp Source: Rectal (11/18 1000) BP: 125/64 (11/18 1000) Pulse Rate: 66 (11/18 1000)  Labs: Recent Labs    01/21/20 0409 01/21/20 0409 01/22/20 0414 01/22/20 0414 01/22/20 1610 01/22/20 1610 01/23/20 0008 01/23/20 0138 01/23/20 0946  HGB 15.6   < > 13.9  --   --   --   --  14.7  --   HCT 52.7*  --  47.0  --   --   --   --  49.9  --   PLT 876*  --  874*  --   --   --   --  968*  --   APTT  --   --  65*   < > 53*   < > 137* 151* 76*  HEPARINUNFRC  --   --  1.41*  --   --   --   --  1.31* 0.98*  CREATININE 1.07   < > 0.83  --  0.88  --   --  0.82  --    < > = values in this interval not displayed.    Estimated Creatinine Clearance: 81.7 mL/min (by C-G formula based on SCr of 0.82 mg/dL).   Medical History: Past Medical History:  Diagnosis Date   CAD (coronary artery disease)    Chronic systolic (congestive) heart failure (HCC)    Diabetes mellitus without complication (HCC)    GERD (gastroesophageal reflux disease)    Gout    Hx of aortic valve replacement    Bioprosthetic valve   Hypertension       Assessment: 69 year old male with h/o CAD, HFrEF,  aortic regurgitation and stenosis s/p bioprosthetic aortic valve, afib on Eliquis. Patient with episodes of ventricular tachycardia requiring defibrillation multiple times. Patient has been on Eliquis here. Plan is for heart cath Friday after appropriate Eliquis washout. Plan to start heparin in interim.  11/17 0414 aPTT 65 11/17 1610 aPTT 53 11/18 0138 aPTT 151 11/18 0946 aPTT 76, HL 0.98   Goal of Therapy:  Heparin level 0.3-0.7 units/ml aPTT 66-102 seconds Monitor platelets by anticoagulation protocol: Yes   Plan:  APTT therapeutic, HL remains elevated. Will continue heparin drip at 1100 units/hr. Recheck aPTT at 1600 to confirm. CBC and HL with morning labs. Continue to follow aPTT until correlation with HL.  12/18, PharmD 01/23/2020 1:15 PM

## 2020-01-23 NOTE — Progress Notes (Addendum)
ANTICOAGULATION CONSULT NOTE   Pharmacy Consult for heparin Indication: atrial fibrillation  Allergies  Allergen Reactions  . Penicillins Other (See Comments)    Has patient had a PCN reaction causing immediate rash, facial/tongue/throat swelling, SOB or lightheadedness with hypotension: Unknown Has patient had a PCN reaction causing severe rash involving mucus membranes or skin necrosis: Unknown Has patient had a PCN reaction that required hospitalization: Unknown Has patient had a PCN reaction occurring within the last 10 years: No If all of the above answers are "NO", then may proceed with Cephalosporin use.     Patient Measurements: Height: 5\' 7"  (170.2 cm) Weight: 72.6 kg (160 lb 0.9 oz) IBW/kg (Calculated) : 66.1 Heparin Dosing Weight: 67 kg  Vital Signs: Temp: 98.8 F (37.1 C) (11/18 1000) Temp Source: Rectal (11/18 1000) BP: 125/64 (11/18 1000) Pulse Rate: 66 (11/18 1000)  Labs: Recent Labs    01/21/20 0409 01/21/20 0409 01/22/20 0414 01/22/20 0414 01/22/20 1610 01/23/20 0008 01/23/20 0138 01/23/20 0946 01/23/20 1617  HGB 15.6   < > 13.9  --   --   --  14.7  --   --   HCT 52.7*  --  47.0  --   --   --  49.9  --   --   PLT 876*  --  874*  --   --   --  968*  --   --   APTT  --   --  65*   < > 53*   < > 151* 76* 89*  HEPARINUNFRC  --   --  1.41*  --   --   --  1.31* 0.98*  --   CREATININE 1.07   < > 0.83  --  0.88  --  0.82  --   --    < > = values in this interval not displayed.    Estimated Creatinine Clearance: 81.7 mL/min (by C-G formula based on SCr of 0.82 mg/dL).   Medical History: Past Medical History:  Diagnosis Date  . CAD (coronary artery disease)   . Chronic systolic (congestive) heart failure (HCC)   . Diabetes mellitus without complication (HCC)   . GERD (gastroesophageal reflux disease)   . Gout   . Hx of aortic valve replacement    Bioprosthetic valve  . Hypertension       Assessment: 69 year old male with h/o CAD, HFrEF,  aortic regurgitation and stenosis s/p bioprosthetic aortic valve, afib on Eliquis. Patient with episodes of ventricular tachycardia requiring defibrillation multiple times. Patient has been on Eliquis here. Plan is for heart cath Friday after appropriate Eliquis washout. Plan to start heparin in interim.  11/17 0414 aPTT 65 11/17 1610 aPTT 53 11/18 0138 aPTT 151 11/18 0946 aPTT 76, HL 0.98  11/18 1617 aPTT 89   Goal of Therapy:  Heparin level 0.3-0.7 units/ml aPTT 66-102 seconds Monitor platelets by anticoagulation protocol: Yes   Plan:  APTT therapeutic x 2, HL remains elevated. Will continue heparin drip at 1800 units/hr. Recheck aPTT, HL, CBC with AM labs. Continue to follow aPTT until correlation with HL.  12/18  PharmD Candidate 2023 01/23/2020 4:46 PM

## 2020-01-23 NOTE — Progress Notes (Signed)
CRITICAL CARE NOTE  [name] presenting to the ED on 01/06/2020 in respiratory distress, complaining of chest pain and shortness of breath. He also reported orthopnea and increased lower extremity swelling. History was limited due to acuity of condition and respiratory status. Patient was placed on biPAP, but progressively worsened and was emergently intubated. Patient is a current 0.5ppd smoker and uses marijuana. He has a severe metabolic acidosis. Patient has a past medical history significant for HTN, T2DM, HLD, GERD, CAD (s/p PCI), AVR with bioprosthetic valve, systolic CHF with EF of 70%, and paroxysmal A. Fib, currently anticoagulated on Eliquis. EKG upon arrival to ED revealed sinus rhythm at a rate of 99bpm with RBBB and PVCs with ST elevation in II, III, avF, V4-V6. Cardiologist on call stated that STEMI criteria were not met and advised to manage CHF. Patient received IV diltiazem and reported improvement of symptoms. Labs were notable for high sensitivity troponin 71->91->89. BNP 1046, creatinine 1.26, BUN 21, potassium 5.4. CXR showed improved pulmonary edema since prior study with chronic pulmonary venous hypertension. Notably, patient was admitted to [facility] ED 12/04/2019 for acute respiratory failure and acute on chronic systolic CHF in the setting of COVID pneumonia.   SIGNIFICANT EVENTS  11/02 - severe cardiogenic shock, remains on vent 11/03 - severe respiratory failure, severe systolic CHF 26/37 - patient remains critically ill 11/05 - patient remains critically ill 11/06 - off pressors, extubated to biPAP 11/07 - remains extubated, febrile, MSSA+ tracheal culture - started ancef 11/08 - remains extubated, on 2L by nasal cannula. Afebrile today with white count of 21.3K, up from 15.7K yesterday. Still encephalopathic and drowsy. On and off Precedex. Went into narrow complex SVT,then progressed to wide complex tachycardiawas shocked and given a loading dose of amiodarone. Will  remain on amiodarone gtt.Cardiology at bedside.  11/09 - remains extubated, on 2L by nasal cannula. Afebrile today with white count of 19.2K, down since yesterday. BP soft overnight, was started on Neo-synephrine infusion. More alert than yesterday, but only oriented to self. Failed Yale 3oz swallow study early this morning, started coughing at the end of the study. Will need to remain NPO until cleared by SLP. Patient has been educated on this, yet continues to incessantly ask for water.  11/10 - patient clinically improved. He is alert communicative and in no distress. He ate entire meal without issues. Still has confusion intermittently during interview. Frequent PVCs on telemetry.  Will initiate OT/PT today for downgrade planning. 11/11 - patient minimally symptomatic, he again had episodes of Torsades today s/p 120Jx1 mag 2g.  Poor prognosis overall with advanced CHF.  11/12 - Dicussed case with cardiology. Antiarrythmic agents being modified. Will also call consult for Electrophysiology  11/13 - Patient with no arrythmia overnight. He remains on precedex and neosynephrine.  I spoke with cardiology Dr Rayann Heman regarding EP evaluation- he recommended to continue with current medical therapy and have cardiologist call to EP on Monday to review options.  He has 1:1 sitter and is resting in bed comfortably. Spoke with Velva Harman POA daughter to update on poor prognosis worsening clinical condition.  11/14 - further deterioration overnight.  Patient required reintubation and MV.  Updated next of kin daughter Velva Harman this am and sent message to cardiology to discuss poor prognosis with family.    11/15 - remains intubated and sedated, high risk for cardiac arrest and death February 18, 2023 - remains intubated and sedated, frequent PVCs overnight, 2g magnesium given, awaiting EP consult, patient becomes tachypneic and diaphoretic during  SBTs 11/17 - increased ectopy, seen by EP who advised weaning lidocaine and restarting  amiodarone, planning for right & left heart cath on Friday 11/19 with Dr. Fletcher Anon, discontinue Eliquis, start IV heparin interim, tracheal culture 11/14 positive for MRSA and pseudomonas, started cefepime and linezolid 11/18 - remains intubated and sedated, on continuous infusion of amiodarone and lidocaine, metoprolol discontinued by cardiology due to low blood pressures, right/left heart cath scheduled for tomorrow  CC  Follow up respiratory failure  SUBJECTIVE Patient remains critically ill Prognosis is guarded Remains on pressors   BP 125/64   Pulse 66   Temp 98.8 F (37.1 C) (Rectal)   Resp 20   Ht _0  (1.702 m)   Wt 72.6 kg   SpO2 96%   BMI 25.07 kg/m    I/O last 3 completed shifts: In: 4679.1 [I.V.:2441.1; NG/GT:1788; IV Piggyback:450.1] Out: 2070 [Urine:2010; Emesis/NG output:60] Total I/O In: 23 [I.V.:23] Out: 200 [Urine:200]  SpO2: 96 % O2 Flow Rate (L/min): 2 L/min FiO2 (%): 40 %  Estimated body mass index is 25.07 kg/m as calculated from the following:   Height as of this encounter: _1  (1.702 m).   Weight as of this encounter: 72.6 kg.  SIGNIFICANT EVENTS   REVIEW OF SYSTEMS  PATIENT IS UNABLE TO PROVIDE COMPLETE REVIEW OF SYSTEMS DUE TO SEVERE CRITICAL ILLNESS   Pressure Injury 01/20/20 Sacrum Posterior;Medial Stage 2 -  Partial thickness loss of dermis presenting as a shallow open injury with a red, pink wound bed without slough. (Active)  01/20/20 0800  Location: Sacrum  Location Orientation: Posterior;Medial  Staging: Stage 2 -  Partial thickness loss of dermis presenting as a shallow open injury with a red, pink wound bed without slough.  Wound Description (Comments):   Present on Admission: No    PHYSICAL EXAMINATION:  GENERAL: Critically ill appearing, intubated and sedated HEAD: Normocephalic, atraumatic.  EYES: Pupils equal, round, reactive to light.  No scleral icterus.  MOUTH: Moist mucosal membrane. NECK: Supple.  PULMONARY:  Vented breath sounds CARDIOVASCULAR: S1 and S2. Regular rate and rhythm. No murmurs, rubs, or gallops.  GASTROINTESTINAL: Soft, nontender, non-distended.  Positive bowel sounds.   MUSCULOSKELETAL: No swelling, clubbing, or edema.  NEUROLOGIC: Obtunded, GCS<8 SKIN: Intact, warm, dry  MEDICATIONS: I have reviewed all medications and confirmed regimen as documented Scheduled Meds: . allopurinol  300 mg Per Tube Daily  . aspirin  81 mg Per Tube Daily  . atorvastatin  40 mg Per Tube QHS  . chlorhexidine  15 mL Mouth Rinse BID  . chlorhexidine gluconate (MEDLINE KIT)  15 mL Mouth Rinse BID  . Chlorhexidine Gluconate Cloth  6 each Topical Daily  . docusate  100 mg Per Tube BID  . feeding supplement (PROSource TF)  45 mL Per Tube Daily  . fentaNYL (SUBLIMAZE) injection  25 mcg Intravenous Once  . furosemide  20 mg Intravenous Daily  . insulin aspart  0-9 Units Subcutaneous Q4H  . linezolid  600 mg Per Tube Q12H  . mouth rinse  15 mL Mouth Rinse 10 times per day  . polyethylene glycol  17 g Per Tube Daily  . scopolamine  1 patch Transdermal Q72H  . senna-docusate  1 tablet Per Tube BID  . sodium chloride flush  10-40 mL Intracatheter Q12H  . sodium chloride flush  3 mL Intravenous Q12H  . sodium chloride flush  3 mL Intravenous Q12H   Continuous Infusions: . sodium chloride 10 mL/hr at 01/12/20 1915  . sodium  chloride 10 mL/hr at 01/23/20 0228  . amiodarone 30 mg/hr (01/23/20 0400)  . ceFEPime (MAXIPIME) IV 2 g (01/23/20 0516)  . famotidine (PEPCID) IV 20 mg (01/23/20 0944)  . feeding supplement (VITAL AF 1.2 CAL) 1,000 mL (01/23/20 0305)  . fentaNYL infusion INTRAVENOUS 150 mcg/hr (01/23/20 0400)  . heparin 1,100 Units/hr (01/23/20 0400)  . lidocaine 1 mg/min (01/23/20 0051)  . midazolam 4 mg/hr (01/23/20 0400)  . phenylephrine (NEO-SYNEPHRINE) Adult infusion 40 mcg/min (01/23/20 0400)   PRN Meds:.sodium chloride, acetaminophen, albuterol, diazepam, fentaNYL, haloperidol lactate,  metoprolol tartrate, ondansetron (ZOFRAN) IV, oxyCODONE, polyethylene glycol, sodium chloride flush, sodium chloride flush   CULTURE RESULTS   Recent Results (from the past 240 hour(s))  Culture, respiratory     Status: None   Collection Time: 01/19/20  3:55 PM   Specimen: Tracheal Aspirate; Respiratory  Result Value Ref Range Status   Specimen Description   Final    TRACHEAL ASPIRATE Performed at [facility], [address] [place], [place] [address]    Special Requests   Final    NONE Performed at [facility], [address] [place], [place] [address]    Gram Stain   Final    MODERATE WBC PRESENT, PREDOMINANTLY PMN ABUNDANT GRAM POSITIVE COCCI IN PAIRS IN CLUSTERS ABUNDANT GRAM NEGATIVE RODS Performed at [facility], [place] [address], [place], [place] [address]    Culture   Final    ABUNDANT METHICILLIN RESISTANT STAPHYLOCOCCUS AUREUS MODERATE PSEUDOMONAS AERUGINOSA    Report Status 01/22/2020 FINAL  Final   Organism ID, Bacteria METHICILLIN RESISTANT STAPHYLOCOCCUS AUREUS  Final   Organism ID, Bacteria PSEUDOMONAS AERUGINOSA  Final      Susceptibility   Methicillin resistant staphylococcus aureus - MIC*    CIPROFLOXACIN <=0.5 SENSITIVE Sensitive     ERYTHROMYCIN >=8 RESISTANT Resistant     GENTAMICIN <=0.5 SENSITIVE Sensitive     OXACILLIN >=4 RESISTANT Resistant     TETRACYCLINE 2 SENSITIVE Sensitive     VANCOMYCIN 1 SENSITIVE Sensitive     TRIMETH/SULFA <=10 SENSITIVE Sensitive     CLINDAMYCIN 1 INTERMEDIATE Intermediate     RIFAMPIN 1 SENSITIVE Sensitive     Inducible Clindamycin NEGATIVE Sensitive     * ABUNDANT METHICILLIN RESISTANT STAPHYLOCOCCUS AUREUS   Pseudomonas aeruginosa - MIC*    CEFTAZIDIME 2 SENSITIVE Sensitive     CIPROFLOXACIN <=0.25 SENSITIVE Sensitive     GENTAMICIN <=1 SENSITIVE Sensitive     IMIPENEM 1 SENSITIVE Sensitive     PIP/TAZO <=4 SENSITIVE Sensitive     CEFEPIME 2 SENSITIVE Sensitive     * MODERATE  PSEUDOMONAS AERUGINOSA    LABS  CBC Latest Ref Rng & Units 01/23/2020 01/22/2020 01/21/2020  WBC 4.0 - 10.5 K/uL 17.5(H) 19.1(H) 20.7(H)  Hemoglobin 13.0 - 17.0 g/dL 14.7 13.9 15.6  Hematocrit 39 - 52 % 49.9 47.0 52.7(H)  Platelets 150 - 400 K/uL 968(HH) 874(H) 876(H)   BMP Latest Ref Rng & Units 01/23/2020 01/22/2020 01/22/2020  Glucose 70 - 99 mg/dL 203(H) 214(H) 360(H)  BUN 8 - 23 mg/dL 27(H) 28(H) 29(H)  Creatinine 0.61 - 1.24 mg/dL 0.82 0.88 0.83  Sodium 135 - 145 mmol/L 138 138 131(L)  Potassium 3.5 - 5.1 mmol/L 4.2 4.3 3.9  Chloride 98 - 111 mmol/L 106 104 101  CO2 22 - 32 mmol/L 21(L) 23 24  Calcium 8.9 - 10.3 mg/dL 8.3(L) 8.4(L) 7.7(L)    Intake/Output Summary (Last 24 hours) at 01/23/2020 1121 Last data filed at  01/23/2020 0943 Gross per 24 hour  Intake 2599.53 ml  Output 1345 ml  Net 1254.53 ml    IMAGING    DG Chest Port 1 View  Result Date: 01/23/2020 CLINICAL DATA:  Endotracheal tube present. EXAM: PORTABLE CHEST 1 VIEW COMPARISON:  01/19/2020 FINDINGS: Endotracheal tube is 2.2 cm above the carina. Nasogastric tube extends into the upper abdomen and terminates in the gastric fundus region. Heart size is mildly enlarged but unchanged. Right arm PICC terminates in the SVC region. Patient has a prosthetic heart valve and previous median sternotomy. New patchy densities at the right lung base. Negative for pneumothorax. Slightly decreased lung volumes. 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 IMPRESSION: 1. New patchy densities at the right lung base. Findings could represent atelectasis or developing infection. Upper lungs are clear. 2. Endotracheal tube is appropriately positioned. Electronically Signed   By: Markus Daft M.D.   On: 01/23/2020 07:48     Nutrition Status: Nutrition Problem: Moderate Malnutrition Etiology: chronic illness (CHF) Signs/Symptoms: moderate fat depletion, moderate muscle  depletion Interventions: Tube feeding, MVI    Indwelling Urinary Catheter continued, requirement due to   Reason to continue Indwelling Urinary Catheter strict Intake/Output monitoring for hemodynamic instability   Central Line/ continued, requirement due to  Reason to continue [employer] Monitoring of central venous pressure or other hemodynamic parameters and poor IV access   Ventilator continued, requirement due to severe respiratory failure   Ventilator Sedation RASS 0 to -2      ASSESSMENT AND PLAN SYNOPSIS [name] with severe respiratory failure due to metabolic acidosis with severe systolic CHF exacerbation, previous history of COVID-19 in the setting of drug abuse, undergoing therapy for HCAP, complicated by tachyarrhythmia and progressive heart failure. Patient with recurrent respiratory failure requiring intubation.  Severe ACUTE Hypoxic and Hypercapnic Respiratory Failure - Continue Full MV support - Continue Bronchodilator Therapy - Wean Fio2 and PEEP as tolerated - VAP/VENT bundle implementation - Patient will need trach and PEG tube to survive - Palliative care following  ACUTE SYSTOLIC CARDIAC FAILURE - EF 20-25% likely related to ETOH and drug abuse need to assess for ischemia - Oxygen as needed - Lasix as tolerated - Follow up cardiac enzymes as indicated - Follow up cardiology recs - Second opinion from [facility] --> recommending repeat echo and cardiac catheterization - Right and Left Heart Cath scheduled 11/19 with Dr. Fletcher Anon, discontinue Eliquis, initiate IV heparin interim  SHOCK-CARDIOGENIC due to tachyarrhythmias - S/p electrical cardioversion - Use vasopressors to keep MAP > 65 - IV amiodarone discontinued due to torsades --> reassessed by EP 11/16, no past evidence of torsades - Transition from IV lidocaine back to IV amiodarone per EP Dr. Quentin Ore - Plan to wean off lidocaine after cardiac catheterization - Metoprolol held due to low blood  pressure - Per cardiology: maintain potassium > 4.0 and magnesium > 2.0  ACUTE KIDNEY INJURY/Renal Failure - Continue Foley Catheter-assess need - Avoid nephrotoxic agents - Follow urine output, BMP - Ensure adequate renal perfusion, optimize oxygenation - Renal dose medications  NEUROLOGY - Acute toxic metabolic encephalopathy, need for sedation - Goal RASS -2 to -3  CARDIAC - ICU monitoring - Telemetry showing sinus rhythm with PVCs  ID  - Tracheal culture 11/14 positive for MRSA and pseudomonas - Continue IV abx as prescribed  GI - GI PROPHYLAXIS as indicated - DIET --> TF's as tolerated - Constipation protocol as indicated  ENDO - Will use ICU  hypoglycemic\Hyperglycemia protocol if indicated  ELECTROLYTES - Follow labs as needed - Replace as needed - Pharmacy consultation and following   DVT/GI PRX ordered and assessed TRANSFUSIONS AS NEEDED MONITOR FSBS I Assessed the need for Labs I Assessed the need for Foley I Assessed the need for Central Venous Line Family Discussion when available I Assessed the need for Mobilization I made an Assessment of medications to be adjusted accordingly Safety Risk assessment completed   CASE DISCUSSED IN MULTIDISCIPLINARY ROUNDS WITH ICU TEAM  Critical Care Time devoted to patient care services described in this note is 56 minutes.   Overall, patient is critically ill, prognosis is guarded.  Patient with Multiorgan failure and at high risk for cardiac arrest and death.   Follow up [place], plan for cath this week  Corrin Parker, M.D.  [facility]  Medical Director [place] Director [facility] Cardio-Pulmonary Department

## 2020-01-24 ENCOUNTER — Encounter: Payer: Self-pay | Admitting: Cardiovascular Disease

## 2020-01-24 ENCOUNTER — Encounter
Admission: EM | Disposition: A | Discharge status: Skilled Nursing Facility | Payer: Self-pay | Source: Home / Self Care | Attending: Internal Medicine

## 2020-01-24 DIAGNOSIS — I4891 Unspecified atrial fibrillation: Secondary | ICD-10-CM | POA: Diagnosis not present

## 2020-01-24 DIAGNOSIS — I5023 Acute on chronic systolic (congestive) heart failure: Secondary | ICD-10-CM | POA: Diagnosis not present

## 2020-01-24 DIAGNOSIS — I472 Ventricular tachycardia: Secondary | ICD-10-CM | POA: Diagnosis not present

## 2020-01-24 HISTORY — PX: RIGHT/LEFT HEART CATH AND CORONARY ANGIOGRAPHY: CATH118266

## 2020-01-24 LAB — BASIC METABOLIC PANEL
Anion gap: 6 (ref 5–15)
BUN: 25 mg/dL — ABNORMAL HIGH (ref 8–23)
CO2: 26 mmol/L (ref 22–32)
Calcium: 8.6 mg/dL — ABNORMAL LOW (ref 8.9–10.3)
Chloride: 107 mmol/L (ref 98–111)
Creatinine, Ser: 0.74 mg/dL (ref 0.61–1.24)
GFR, Estimated: >60 mL/min (ref >60)
Glucose, Bld: 179 mg/dL — ABNORMAL HIGH (ref 70–99)
Potassium: 4 mmol/L (ref 3.5–5.1)
Sodium: 139 mmol/L (ref 135–145)

## 2020-01-24 LAB — HEPARIN LEVEL (UNFRACTIONATED): Heparin Unfractionated: 0.88 IU/mL — ABNORMAL HIGH (ref 0.30–0.70)

## 2020-01-24 LAB — CBC WITH DIFFERENTIAL/PLATELET
Abs Immature Granulocytes: 0.32 10*3/uL — ABNORMAL HIGH (ref 0.00–0.07)
Basophils Absolute: 0.2 10*3/uL — ABNORMAL HIGH (ref 0.0–0.1)
Basophils Relative: 2 %
Eosinophils Absolute: 0.8 10*3/uL — ABNORMAL HIGH (ref 0.0–0.5)
Eosinophils Relative: 5 %
HCT: 48.7 % (ref 39.0–52.0)
Hemoglobin: 14.4 g/dL (ref 13.0–17.0)
Immature Granulocytes: 2 %
Lymphocytes Relative: 12 %
Lymphs Abs: 1.9 10*3/uL (ref 0.7–4.0)
MCH: 20.5 pg — ABNORMAL LOW (ref 26.0–34.0)
MCHC: 29.6 g/dL — ABNORMAL LOW (ref 30.0–36.0)
MCV: 69.5 fL — ABNORMAL LOW (ref 80.0–100.0)
Monocytes Absolute: 1.5 10*3/uL — ABNORMAL HIGH (ref 0.1–1.0)
Monocytes Relative: 10 %
Neutro Abs: 10.5 10*3/uL — ABNORMAL HIGH (ref 1.7–7.7)
Neutrophils Relative %: 69 %
Platelets: 917 10*3/uL (ref 150–400)
RBC: 7.01 MIL/uL — ABNORMAL HIGH (ref 4.22–5.81)
RDW: 26.8 % — ABNORMAL HIGH (ref 11.5–15.5)
Smear Review: INCREASED
WBC: 15.2 10*3/uL — ABNORMAL HIGH (ref 4.0–10.5)
nRBC: 0 % (ref 0.0–0.2)

## 2020-01-24 LAB — GLUCOSE, CAPILLARY
Glucose-Capillary: 171 mg/dL — ABNORMAL HIGH (ref 70–99)
Glucose-Capillary: 156 mg/dL — ABNORMAL HIGH (ref 70–99)
Glucose-Capillary: 142 mg/dL — ABNORMAL HIGH (ref 70–99)
Glucose-Capillary: 124 mg/dL — ABNORMAL HIGH (ref 70–99)
Glucose-Capillary: 125 mg/dL — ABNORMAL HIGH (ref 70–99)

## 2020-01-24 LAB — LIDOCAINE LEVEL: Lidocaine Lvl: 2.9 ug/mL (ref 1.5–5.0)

## 2020-01-24 LAB — MAGNESIUM: Magnesium: 1.7 mg/dL (ref 1.7–2.4)

## 2020-01-24 LAB — APTT: aPTT: 76 seconds — ABNORMAL HIGH (ref 24–36)

## 2020-01-24 LAB — PHOSPHORUS: Phosphorus: 2.5 mg/dL (ref 2.5–4.6)

## 2020-01-24 LAB — TRIGLYCERIDES: Triglycerides: 65 mg/dL (ref <150)

## 2020-01-24 SURGERY — RIGHT/LEFT HEART CATH AND CORONARY ANGIOGRAPHY
Anesthesia: Moderate Sedation

## 2020-01-24 MED ORDER — SODIUM CHLORIDE 0.9% FLUSH: 3.0000 mL | INTRAVENOUS | Status: DC | PRN

## 2020-01-24 MED ORDER — SODIUM CHLORIDE 0.9 % IV SOLN: 250.0000 mL | INTRAVENOUS | Status: DC | PRN

## 2020-01-24 MED ORDER — LIDOCAINE HCL (PF) 1 % IJ SOLN
INTRAMUSCULAR | Status: DC | PRN
  Administered 2020-01-24: 30 mL

## 2020-01-24 MED ORDER — SODIUM CHLORIDE 0.9 % IV SOLN: INTRAVENOUS | Status: DC

## 2020-01-24 MED ORDER — HEPARIN (PORCINE) IN NACL 1000-0.9 UT/500ML-% IV SOLN
INTRAVENOUS | Status: DC | PRN
  Administered 2020-01-24: 500 mL

## 2020-01-24 MED ORDER — HEPARIN (PORCINE) 25000 UT/250ML-% IV SOLN
1200.0000 [IU]/h | INTRAVENOUS | Status: AC
  Administered 2020-01-24 – 2020-01-28 (×6): 1100 [IU]/h via INTRAVENOUS
  Filled 2020-01-24 (×5): qty 250

## 2020-01-24 MED ORDER — SODIUM CHLORIDE 0.9 % IV SOLN
INTRAVENOUS | Status: DC | PRN
  Administered 2020-01-24 – 2020-01-31 (×3): 250 mL via INTRAVENOUS
  Administered 2020-02-01: 500 mL via INTRAVENOUS

## 2020-01-24 MED ORDER — SODIUM CHLORIDE 0.9% FLUSH: 3.0000 mL | Freq: Two times a day (BID) | INTRAVENOUS | Status: DC

## 2020-01-24 MED ORDER — IOHEXOL 300 MG/ML  SOLN
INTRAMUSCULAR | Status: DC | PRN
  Administered 2020-01-24: 55 mL

## 2020-01-24 MED ORDER — LIDOCAINE HCL (PF) 1 % IJ SOLN
INTRAMUSCULAR | Status: AC
  Filled 2020-01-24: qty 30

## 2020-01-24 MED ORDER — CARVEDILOL 3.125 MG PO TABS
3.1250 mg | ORAL_TABLET | Freq: Two times a day (BID) | ORAL | Status: DC
  Administered 2020-01-24 – 2020-01-30 (×9): 3.125 mg via ORAL
  Filled 2020-01-24 (×9): qty 1

## 2020-01-24 SURGICAL SUPPLY — 15 items
CANNULA 5F STIFF (CANNULA) ×3 IMPLANT
CATH INFINITI 5FR JL4 (CATHETERS) ×3 IMPLANT
CATH INFINITI JR4 5F (CATHETERS) ×3 IMPLANT
CATH SWAN GANZ 7F STRAIGHT (CATHETERS) ×3 IMPLANT
DEVICE CLOSURE MYNXGRIP 5F (Vascular Products) ×3 IMPLANT
GLIDESHEATH SLEND SS 6F .021 (SHEATH) IMPLANT
GUIDEWIRE INQWIRE 1.5J.035X260 (WIRE) IMPLANT
INQWIRE 1.5J .035X260CM (WIRE)
KIT MANI 3VAL PERCEP (MISCELLANEOUS) ×3 IMPLANT
PACK CARDIAC CATH (CUSTOM PROCEDURE TRAY) ×3 IMPLANT
PAD ELECT DEFIB RADIOL ZOLL (MISCELLANEOUS) ×3 IMPLANT
SHEATH AVANTI 5FR X 11CM (SHEATH) ×3 IMPLANT
SHEATH AVANTI 7FRX11 (SHEATH) ×3 IMPLANT
SHEATH GLIDE SLENDER 4/5FR (SHEATH) IMPLANT
WIRE GUIDERIGHT .035X150 (WIRE) ×3 IMPLANT

## 2020-01-24 NOTE — Progress Notes (Signed)
ANTICOAGULATION CONSULT NOTE   Pharmacy Consult for heparin Indication: atrial fibrillation  Allergies  Allergen Reactions  . Penicillins Other (See Comments)    Has patient had a PCN reaction causing immediate rash, facial/tongue/throat swelling, SOB or lightheadedness with hypotension: Unknown Has patient had a PCN reaction causing severe rash involving mucus membranes or skin necrosis: Unknown Has patient had a PCN reaction that required hospitalization: Unknown Has patient had a PCN reaction occurring within the last 10 years: No If all of the above answers are "NO", then may proceed with Cephalosporin use.     Patient Measurements: Height: 5' 7.01" (170.2 cm) Weight: 71.9 kg (158 lb 8.2 oz) IBW/kg (Calculated) : 66.12 Heparin Dosing Weight: 67 kg  Vital Signs: Temp: 98.6 F (37 C) (11/19 0400) Temp Source: Axillary (11/19 0400) BP: 105/80 (11/19 0400) Pulse Rate: 63 (11/19 0400)  Labs: Recent Labs    01/22/20 0414 01/22/20 0414 01/22/20 1610 01/23/20 0008 01/23/20 0138 01/23/20 0138 01/23/20 0946 01/23/20 1617 01/24/20 0425  HGB 13.9  --   --   --  14.7  --   --   --   --   HCT 47.0  --   --   --  49.9  --   --   --   --   PLT 874*  --   --   --  968*  --   --   --   --   APTT 65*   < > 53*   < > 151*   < > 76* 89* 76*  HEPARINUNFRC 1.41*   < >  --   --  1.31*  --  0.98*  --  0.88*  CREATININE 0.83   < > 0.88  --  0.82  --   --   --  0.74   < > = values in this interval not displayed.    Estimated Creatinine Clearance: 83.8 mL/min (by C-G formula based on SCr of 0.74 mg/dL).   Medical History: Past Medical History:  Diagnosis Date  . CAD (coronary artery disease)   . Chronic systolic (congestive) heart failure (HCC)   . Diabetes mellitus without complication (HCC)   . GERD (gastroesophageal reflux disease)   . Gout   . Hx of aortic valve replacement    Bioprosthetic valve  . Hypertension       Assessment: 69 year old male with h/o CAD, HFrEF,  aortic regurgitation and stenosis s/p bioprosthetic aortic valve, afib on Eliquis. Patient with episodes of ventricular tachycardia requiring defibrillation multiple times. Patient has been on Eliquis here. Plan is for heart cath Friday after appropriate Eliquis washout. Plan to start heparin in interim.  11/17 0414 aPTT 65 11/17 1610 aPTT 53 11/18 0138 aPTT 151 11/18 0946 aPTT 76, HL 0.98  11/18 1617 aPTT 89  11/19 0425 aPTT 76, HL 0.88  Goal of Therapy:  Heparin level 0.3-0.7 units/ml aPTT 66-102 seconds Monitor platelets by anticoagulation protocol: Yes   Plan:  11/19 @ 0425:  APTT = 76, HL 0.88 APTT is therapeutic but HL still elevated.  Will continue pt on current rate and recheck aPTT and HL tomorrow with AM labs.  Reginaldo Hazard D  01/24/2020 5:25 AM

## 2020-01-24 NOTE — Progress Notes (Signed)
Pt taken to Cath lab and back to CCU without complications.

## 2020-01-24 NOTE — Progress Notes (Signed)
Nutrition Follow-up  DOCUMENTATION CODES:   Non-severe (moderate) malnutrition in context of chronic illness  INTERVENTION:  Continue Vital AF 1.2 Cal at 60 mL/hr (1440 mL goal daily volume) + PROSource TF 45 mL once daily per tube. Provides 1768 kcal, 119 grams of protein, 1166 mL H2O daily.  NUTRITION DIAGNOSIS:   Moderate Malnutrition related to chronic illness (CHF) as evidenced by moderate fat depletion, moderate muscle depletion.  Ongoing.  GOAL:   Patient will meet greater than or equal to 90% of their needs  Met with TF regimen.  MONITOR:   Vent status, Labs, Weight trends, TF tolerance, I & O's  REASON FOR ASSESSMENT:   Ventilator, Consult Enteral/tube feeding initiation and management  ASSESSMENT:   69 year old male with PMHx of HTN, DM, gout, hx AVR with bioprosthetic valve, GERD, CAD, CHF (EF 20%), recent COVID-19 11/30/2019 admitted with respiratory failure, severe metabolic acidosis, CHF exacerbation.  11/1 intubated 11/6 extubated 11/9 diet advanced to dysphagia 1 with thin liquids following SLP evaluation 11/14 reintubated  Patient is currently intubated on ventilator support MV: 8 L/min Temp (24hrs), Avg:98.5 F (36.9 C), Min:97.9 F (36.6 C), Max:98.8 F (37.1 C)  Medications reviewed and include: Colace 100 mg BID, Novolog 0-9 units Q4hrs, Miralax, senna-docusate 1 tablet BID, cefepime, famotidine, fentanyl gtt, Versed gtt.  Labs reviewed: CBG 124-156, BUN 25.  I/O: 1885 mL UOP yesterday (1.1 mL/kg/hr)  Weight trend: 71.9 kg on 11/19; -6.8 kg from 11/1  Enteral Access: 18 Fr. OGT placed 11/14; terminates in gastric fundus per abdominal x-ray 11/15 s/p advancement  TF regimen: Vital AF 1.2 Cal at 60 mL/hr + PROSource TF 45 mL once daily per tube  Discussed with RN and on rounds. Patient went to cath lab today.  Diet Order:   Diet Order            Diet NPO time specified  Diet effective now                EDUCATION NEEDS:   No  education needs have been identified at this time  Skin:  Skin Assessment: Skin Integrity Issues: Skin Integrity Issues:: Stage II Stage II: sacrum (2.5cm x 1cm)  Last BM:  01/24/2020 - medium type 7  Height:   Ht Readings from Last 1 Encounters:  01/24/20 5' 7.01" (1.702 m)   Weight:   Wt Readings from Last 1 Encounters:  01/24/20 71.9 kg   Ideal Body Weight:  67.3 kg  BMI:  Body mass index is 24.82 kg/m.  Estimated Nutritional Needs:   Kcal:  1751  Protein:  110-120 grams  Fluid:  1.5-1.8 L/day or per MD  Jacklynn Barnacle, MS, RD, LDN Pager number available on Amion

## 2020-01-24 NOTE — Progress Notes (Signed)
ANTICOAGULATION CONSULT NOTE   Pharmacy Consult for heparin Indication: atrial fibrillation  Allergies  Allergen Reactions  . Penicillins Other (See Comments)    Has patient had a PCN reaction causing immediate rash, facial/tongue/throat swelling, SOB or lightheadedness with hypotension: Unknown Has patient had a PCN reaction causing severe rash involving mucus membranes or skin necrosis: Unknown Has patient had a PCN reaction that required hospitalization: Unknown Has patient had a PCN reaction occurring within the last 10 years: No If all of the above answers are "NO", then may proceed with Cephalosporin use.     Patient Measurements: Height: 5' 7.01" (170.2 cm) Weight: 71.9 kg (158 lb 8.2 oz) IBW/kg (Calculated) : 66.12 Heparin Dosing Weight: 67 kg  Vital Signs: Temp: 98.4 F (36.9 C) (11/19 1000) Temp Source: Axillary (11/19 0400) BP: 141/72 (11/19 1000) Pulse Rate: 82 (11/19 1000)  Labs: Recent Labs    01/22/20 0414 01/22/20 0414 01/22/20 1610 01/23/20 0008 01/23/20 0138 01/23/20 0138 01/23/20 0946 01/23/20 1617 01/24/20 0425  HGB 13.9   < >  --   --  14.7  --   --   --  14.4  HCT 47.0  --   --   --  49.9  --   --   --  48.7  PLT 874*  --   --   --  968*  --   --   --  917*  APTT 65*   < > 53*   < > 151*   < > 76* 89* 76*  HEPARINUNFRC 1.41*   < >  --   --  1.31*  --  0.98*  --  0.88*  CREATININE 0.83   < > 0.88  --  0.82  --   --   --  0.74   < > = values in this interval not displayed.    Estimated Creatinine Clearance: 83.8 mL/min (by C-G formula based on SCr of 0.74 mg/dL).   Medical History: Past Medical History:  Diagnosis Date  . CAD (coronary artery disease)   . Chronic systolic (congestive) heart failure (HCC)   . Diabetes mellitus without complication (HCC)   . GERD (gastroesophageal reflux disease)   . Gout   . Hx of aortic valve replacement    Bioprosthetic valve  . Hypertension       Assessment: 69 year old male with h/o CAD,  HFrEF, aortic regurgitation and stenosis s/p bioprosthetic aortic valve, afib on Eliquis. Patient with episodes of ventricular tachycardia requiring defibrillation multiple times. Patient has been on Eliquis here. Plan is for heart cath Friday after appropriate Eliquis washout. Plan to start heparin in interim.  11/17 0414 aPTT 65 11/17 1610 aPTT 53 11/18 0138 aPTT 151 11/18 0946 aPTT 76, HL 0.98  11/18 1617 aPTT 89 11/19 0425 aPTT 76, HL 0.88   Goal of Therapy:  Heparin level 0.3-0.7 units/ml aPTT 66-102 seconds Monitor platelets by anticoagulation protocol: Yes   Plan:  Patient s/p cath this morning. Consult to restart heparin 8 hours post-sheath removal. Per procedure log, sheath removed ~ 1100. Will restart heparin at previously therapeutic rate of 1100 units/hr today at 1900. Will continue with aPTT, HL and CBC with morning labs.   Laureen Ochs, PharmD 01/24/2020 12:13 PM

## 2020-01-24 NOTE — TOC Progression Note (Signed)
Transition of Care [facility]) - Progression Note    Patient Details  Name: Travis Maxwell MRN: [id number] Date of Birth: 06/13/1950  Transition of [facility]) CM/SW Contact  Marina Goodell Phone Number:  [phone] 01/24/2020, 12:16 PM  Clinical Narrative:     Patient returned from catheretization, no complications.   Expected Discharge Plan: Skilled Nursing Facility Barriers to Discharge: Continued Medical Work up, Other (comment) (Patient continues to be medically unstable.)  Expected Discharge Plan and Services Expected Discharge Plan: Skilled Nursing Facility In-house Referral: Clinical Social Work   Post Acute Care Choice: Skilled Nursing Facility Living arrangements for the past 2 months: Single Family Home                                       Social Determinants of Health (SDOH) Interventions    Readmission Risk Interventions No flowsheet data found.

## 2020-01-24 NOTE — Progress Notes (Signed)
CRITICAL CARE NOTE  [name] presenting to the ED on 01/06/2020 in respiratory distress, complaining of chest pain and shortness of breath. He also reported orthopnea and increased lower extremity swelling. History was limited due to acuity of condition and respiratory status. Patient was placed on biPAP, but progressively worsened and was emergently intubated. Patient is a current 0.5ppd smoker and uses marijuana. He has a severe metabolic acidosis. Patient has a past medical history significant for HTN, T2DM, HLD, GERD, CAD (s/p PCI), AVR with bioprosthetic valve, systolic CHF with EF of 70%, and paroxysmal A. Fib, currently anticoagulated on Eliquis. EKG upon arrival to ED revealed sinus rhythm at a rate of 99bpm with RBBB and PVCs with ST elevation in II, III, avF, V4-V6. Cardiologist on call stated that STEMI criteria were not met and advised to manage CHF. Patient received IV diltiazem and reported improvement of symptoms. Labs were notable for high sensitivity troponin 71->91->89. BNP 1046, creatinine 1.26, BUN 21, potassium 5.4. CXR showed improved pulmonary edema since prior study with chronic pulmonary venous hypertension. Notably, patient was admitted to [facility] ED 12/04/2019 for acute respiratory failure and acute on chronic systolic CHF in the setting of COVID pneumonia.   SIGNIFICANT EVENTS  11/02 - severe cardiogenic shock, remains on vent 11/03 - severe respiratory failure, severe systolic CHF 26/37 - patient remains critically ill 11/05 - patient remains critically ill 11/06 - off pressors, extubated to biPAP 11/07 - remains extubated, febrile, MSSA+ tracheal culture - started ancef 11/08 - remains extubated, on 2L by nasal cannula. Afebrile today with white count of 21.3K, up from 15.7K yesterday. Still encephalopathic and drowsy. On and off Precedex. Went into narrow complex SVT,then progressed to wide complex tachycardiawas shocked and given a loading dose of amiodarone. Will  remain on amiodarone gtt.Cardiology at bedside.  11/09 - remains extubated, on 2L by nasal cannula. Afebrile today with white count of 19.2K, down since yesterday. BP soft overnight, was started on Neo-synephrine infusion. More alert than yesterday, but only oriented to self. Failed Yale 3oz swallow study early this morning, started coughing at the end of the study. Will need to remain NPO until cleared by SLP. Patient has been educated on this, yet continues to incessantly ask for water.  11/10 - patient clinically improved. He is alert communicative and in no distress. He ate entire meal without issues. Still has confusion intermittently during interview. Frequent PVCs on telemetry.  Will initiate OT/PT today for downgrade planning. 11/11 - patient minimally symptomatic, he again had episodes of Torsades today s/p 120Jx1 mag 2g.  Poor prognosis overall with advanced CHF.  11/12 - Dicussed case with cardiology. Antiarrythmic agents being modified. Will also call consult for Electrophysiology  11/13 - Patient with no arrythmia overnight. He remains on precedex and neosynephrine.  I spoke with cardiology Dr Rayann Heman regarding EP evaluation- he recommended to continue with current medical therapy and have cardiologist call to EP on Monday to review options.  He has 1:1 sitter and is resting in bed comfortably. Spoke with Velva Harman POA daughter to update on poor prognosis worsening clinical condition.  11/14 - further deterioration overnight.  Patient required reintubation and MV.  Updated next of kin daughter Velva Harman this am and sent message to cardiology to discuss poor prognosis with family.    11/15 - remains intubated and sedated, high risk for cardiac arrest and death February 18, 2023 - remains intubated and sedated, frequent PVCs overnight, 2g magnesium given, awaiting EP consult, patient becomes tachypneic and diaphoretic during  SBTs 11/17 - increased ectopy, seen by EP who advised weaning lidocaine and restarting  amiodarone, planning for right & left heart cath on Friday 11/19 with Dr. Fletcher Anon, discontinue Eliquis, start IV heparin interim, tracheal culture 11/14 positive for MRSA and pseudomonas, started cefepime and linezolid 11/18 - remains intubated and sedated, on continuous infusion of amiodarone and lidocaine, metoprolol discontinued by cardiology due to low blood pressures, right/left heart cath scheduled for tomorrow 11/19- pt s/p L&RHC no obstuctive lesions, RHC with normal pressures. Patient will be treated medically with transition to PO amio and plan for SBT with attempt to liberate.    CC  Follow up respiratory failure  SUBJECTIVE Patient remains critically ill Prognosis is guarded Remains on pressors   BP 138/60   Pulse 83   Temp 98.4 F (36.9 C)   Resp 19   Ht 5' 7.01" (1.702 m)   Wt 71.9 kg   SpO2 100%   BMI 24.82 kg/m    I/O last 3 completed shifts: In: 3905.3 [I.V.:2451.2; NG/GT:954.1; IV Piggyback:500] Out: 2295 [Urine:2295] Total I/O In: 514.9 [I.V.:414.9; IV Piggyback:100] Out: 1200 [Urine:1200]  SpO2: 100 % O2 Flow Rate (L/min): 2 L/min FiO2 (%): 35 %  Estimated body mass index is 24.82 kg/m as calculated from the following:   Height as of this encounter: 5' 7.01" (1.702 m).   Weight as of this encounter: 71.9 kg.  SIGNIFICANT EVENTS   REVIEW OF SYSTEMS  PATIENT IS UNABLE TO PROVIDE COMPLETE REVIEW OF SYSTEMS DUE TO SEVERE CRITICAL ILLNESS   Pressure Injury 01/20/20 Sacrum Posterior;Medial Stage 2 -  Partial thickness loss of dermis presenting as a shallow open injury with a red, pink wound bed without slough. (Active)  01/20/20 0800  Location: Sacrum  Location Orientation: Posterior;Medial  Staging: Stage 2 -  Partial thickness loss of dermis presenting as a shallow open injury with a red, pink wound bed without slough.  Wound Description (Comments):   Present on Admission: No    PHYSICAL EXAMINATION:  GENERAL: Critically ill appearing,  intubated and sedated HEAD: Normocephalic, atraumatic.  EYES: Pupils equal, round, reactive to light.  No scleral icterus.  MOUTH: Moist mucosal membrane. NECK: Supple.  PULMONARY: Vented breath sounds CARDIOVASCULAR: S1 and S2. Regular rate and rhythm. No murmurs, rubs, or gallops.  GASTROINTESTINAL: Soft, nontender, non-distended.  Positive bowel sounds.   MUSCULOSKELETAL: No swelling, clubbing, or edema.  NEUROLOGIC: Obtunded, GCS<8T SKIN: Intact, warm, dry  MEDICATIONS: I have reviewed all medications and confirmed regimen as documented Scheduled Meds: . allopurinol  300 mg Per Tube Daily  . aspirin  81 mg Per Tube Daily  . atorvastatin  40 mg Per Tube QHS  . carvedilol  3.125 mg Oral BID WC  . chlorhexidine gluconate (MEDLINE KIT)  15 mL Mouth Rinse BID  . Chlorhexidine Gluconate Cloth  6 each Topical Daily  . docusate  100 mg Per Tube BID  . feeding supplement (PROSource TF)  45 mL Per Tube Daily  . fentaNYL (SUBLIMAZE) injection  25 mcg Intravenous Once  . furosemide  20 mg Intravenous Daily  . insulin aspart  0-9 Units Subcutaneous Q4H  . linezolid  600 mg Per Tube Q12H  . mouth rinse  15 mL Mouth Rinse 10 times per day  . polyethylene glycol  17 g Per Tube Daily  . scopolamine  1 patch Transdermal Q72H  . senna-docusate  1 tablet Per Tube BID  . sodium chloride flush  10-40 mL Intracatheter Q12H  . sodium chloride flush  3 mL Intravenous Q12H   Continuous Infusions: . amiodarone 30 mg/hr (01/24/20 1349)  . ceFEPime (MAXIPIME) IV 2 g (01/24/20 1352)  . famotidine (PEPCID) IV 20 mg (01/24/20 1142)  . feeding supplement (VITAL AF 1.2 CAL) Stopped (01/23/20 2345)  . fentaNYL infusion INTRAVENOUS 150 mcg/hr (01/24/20 1019)  . heparin    . midazolam 5 mg/hr (01/24/20 1019)  . phenylephrine (NEO-SYNEPHRINE) Adult infusion Stopped (01/24/20 0319)   PRN Meds:.acetaminophen, albuterol, diazepam, fentaNYL, haloperidol lactate, metoprolol tartrate, ondansetron (ZOFRAN) IV,  oxyCODONE, polyethylene glycol   CULTURE RESULTS   Recent Results (from the past 240 hour(s))  Culture, respiratory     Status: None   Collection Time: 01/19/20  3:55 PM   Specimen: Tracheal Aspirate; Respiratory  Result Value Ref Range Status   Specimen Description   Final    TRACHEAL ASPIRATE Performed at [facility], [address] [place], [place] [address]    Special Requests   Final    NONE Performed at [facility], [address] [place], [place] [address]    Gram Stain   Final    MODERATE WBC PRESENT, PREDOMINANTLY PMN ABUNDANT GRAM POSITIVE COCCI IN PAIRS IN CLUSTERS ABUNDANT GRAM NEGATIVE RODS Performed at [facility], [place] [address], [place], [place] [address]    Culture   Final    ABUNDANT METHICILLIN RESISTANT STAPHYLOCOCCUS AUREUS MODERATE PSEUDOMONAS AERUGINOSA    Report Status 01/22/2020 FINAL  Final   Organism ID, Bacteria METHICILLIN RESISTANT STAPHYLOCOCCUS AUREUS  Final   Organism ID, Bacteria PSEUDOMONAS AERUGINOSA  Final      Susceptibility   Methicillin resistant staphylococcus aureus - MIC*    CIPROFLOXACIN <=0.5 SENSITIVE Sensitive     ERYTHROMYCIN >=8 RESISTANT Resistant     GENTAMICIN <=0.5 SENSITIVE Sensitive     OXACILLIN >=4 RESISTANT Resistant     TETRACYCLINE 2 SENSITIVE Sensitive     VANCOMYCIN 1 SENSITIVE Sensitive     TRIMETH/SULFA <=10 SENSITIVE Sensitive     CLINDAMYCIN 1 INTERMEDIATE Intermediate     RIFAMPIN 1 SENSITIVE Sensitive     Inducible Clindamycin NEGATIVE Sensitive     * ABUNDANT METHICILLIN RESISTANT STAPHYLOCOCCUS AUREUS   Pseudomonas aeruginosa - MIC*    CEFTAZIDIME 2 SENSITIVE Sensitive     CIPROFLOXACIN <=0.25 SENSITIVE Sensitive     GENTAMICIN <=1 SENSITIVE Sensitive     IMIPENEM 1 SENSITIVE Sensitive     PIP/TAZO <=4 SENSITIVE Sensitive     CEFEPIME 2 SENSITIVE Sensitive     * MODERATE PSEUDOMONAS AERUGINOSA    LABS  CBC Latest Ref Rng & Units 01/24/2020 01/23/2020 01/22/2020   WBC 4.0 - 10.5 K/uL 15.2(H) 17.5(H) 19.1(H)  Hemoglobin 13.0 - 17.0 g/dL 14.4 14.7 13.9  Hematocrit 39 - 52 % 48.7 49.9 47.0  Platelets 150 - 400 K/uL 917(HH) 968(HH) 874(H)   BMP Latest Ref Rng & Units 01/24/2020 01/23/2020 01/22/2020  Glucose 70 - 99 mg/dL 179(H) 203(H) 214(H)  BUN 8 - 23 mg/dL 25(H) 27(H) 28(H)  Creatinine 0.61 - 1.24 mg/dL 0.74 0.82 0.88  Sodium 135 - 145 mmol/L 139 138 138  Potassium 3.5 - 5.1 mmol/L 4.0 4.2 4.3  Chloride 98 - 111 mmol/L 107 106 104  CO2 22 - 32 mmol/L 26 21(L) 23  Calcium 8.9 - 10.3 mg/dL 8.6(L) 8.3(L) 8.4(L)    Intake/Output Summary (Last 24 hours) at 01/24/2020 1623 Last data filed at 01/24/2020 1400 Gross per 24 hour  Intake 2448.85 ml  Output 2285 ml  Net 163.85 ml  IMAGING    CARDIAC CATHETERIZATION  Result Date: 01/24/2020  Previously placed Prox RCA to Mid RCA stent (unknown type) is widely patent.  Prox RCA lesion is 50% stenosed.  1.  Patent stent in a nondominant right coronary artery.  No significant coronary artery disease overall. 2.  The replaced aortic valve was not crossed. 3.  Right heart catheterization showed mildly elevated filling pressures, minimal pulmonary hypertension and normal cardiac output. Recommendations: The patient has no ischemic substrate for ventricular tachycardia.  He has nonischemic cardiomyopathy. His hemodynamics appear good overall.  Recommend continuing same dose of IV furosemide 20 mg daily which can be switched to an oral diuretic after extubation. I am going to stop lidocaine drip and continue amiodarone drip which can subsequently be switched to oral after extubation. I elected to add small dose carvedilol to assist with his cardiomyopathy and ventricular tachycardia.    Nutrition Status: Nutrition Problem: Moderate Malnutrition Etiology: chronic illness (CHF) Signs/Symptoms: moderate fat depletion, moderate muscle depletion Interventions: Tube feeding, MVI    Indwelling Urinary  Catheter continued, requirement due to   Reason to continue Indwelling Urinary Catheter strict Intake/Output monitoring for hemodynamic instability   Central Line/ continued, requirement due to  Reason to continue [employer] Monitoring of central venous pressure or other hemodynamic parameters and poor IV access   Ventilator continued, requirement due to severe respiratory failure   Ventilator Sedation RASS 0 to -2      ASSESSMENT AND PLAN SYNOPSIS [name] with severe respiratory failure due to metabolic acidosis with severe systolic CHF exacerbation, previous history of COVID-19 in the setting of drug abuse, undergoing therapy for HCAP, complicated by tachyarrhythmia and progressive heart failure. Patient with recurrent respiratory failure requiring intubation.  Severe ACUTE Hypoxic and Hypercapnic Respiratory Failure - Continue Full MV support - Continue Bronchodilator Therapy - Wean Fio2 and PEEP as tolerated - VAP/VENT bundle implementation - Patient will need trach and PEG tube to survive - Palliative care following -MRSA and Pseudomonas pneumonia - c/w zyvox and cefepime  ACUTE SYSTOLIC CARDIAC FAILURE - EF 20-25% likely related to ETOH and drug abuse need to assess for ischemia - Oxygen as needed - Lasix as tolerated - Follow up cardiac enzymes as indicated - Follow up cardiology recs - Second opinion from [facility] --> recommending repeat echo and cardiac catheterization - Right and Left Heart Cath scheduled 11/19 with Dr. Fletcher Anon, discontinue Eliquis, initiate IV heparin interim  SHOCK-CARDIOGENIC due to tachyarrhythmias - S/p electrical cardioversion - Use vasopressors to keep MAP > 65 - IV amiodarone discontinued due to torsades --> reassessed by EP 11/16, no past evidence of torsades - Transition from IV lidocaine back to IV amiodarone per EP Dr. Quentin Ore - Plan to wean off lidocaine after cardiac catheterization - Metoprolol held due to low blood pressure - Per  cardiology: maintain potassium > 4.0 and magnesium > 2.0  ACUTE KIDNEY INJURY/Renal Failure - Continue Foley Catheter-assess need - Avoid nephrotoxic agents - Follow urine output, BMP - Ensure adequate renal perfusion, optimize oxygenation - Renal dose medications  NEUROLOGY - Acute toxic metabolic encephalopathy, need for sedation - Goal RASS -2 to -3  CARDIAC - ICU monitoring - Telemetry showing sinus rhythm with PVCs  ID  - Tracheal culture 11/14 positive for MRSA and pseudomonas - Continue IV abx as prescribed  GI - GI PROPHYLAXIS as indicated - DIET --> TF's as tolerated - Constipation protocol as indicated  ENDO - Will use ICU hypoglycemic\Hyperglycemia protocol if indicated  ELECTROLYTES -  Follow labs as needed - Replace as needed - Pharmacy consultation and following   DVT/GI PRX ordered and assessed TRANSFUSIONS AS NEEDED MONITOR FSBS I Assessed the need for Labs I Assessed the need for Foley I Assessed the need for Central Venous Line Family Discussion when available I Assessed the need for Mobilization I made an Assessment of medications to be adjusted accordingly Safety Risk assessment completed   CASE DISCUSSED IN MULTIDISCIPLINARY ROUNDS WITH ICU TEAM  Critical Care Time devoted to patient care services described in this note is 33 minutes.   Overall, patient is critically ill, prognosis is guarded.  Patient with Multiorgan failure and at high risk for cardiac arrest and death.   Follow up [facility] recs, plan for cath this week   Ottie Glazier, M.D.  Pulmonary & [place]

## 2020-01-24 NOTE — Progress Notes (Signed)
PHARMACY CONSULT NOTE - FOLLOW UP  Pharmacy Consult for Electrolyte Monitoring and Replacement   Recent Labs: Potassium (mmol/L)  Date Value  01/24/2020 4.0   Magnesium (mg/dL)  Date Value  56/86/1683 2.3   Calcium (mg/dL)  Date Value  72/90/2111 8.6 (L)   Albumin (g/dL)  Date Value  55/20/8022 3.2 (L)   Phosphorus (mg/dL)  Date Value  33/61/2244 2.5   Sodium (mmol/L)  Date Value  01/24/2020 139   Assessment: 69 year old male transferred to ICU for acute toxic metabolic encephalopathy. Patient now more alert and awake. Overnight 11/10, patient with two runs pulseless VT, with second episode requiring CPR and defibrillation. Converted back into baseline bundle branch rhythm with rate in 50's-60's.  Pt received magnesium 2 gm during episode. Similar episodes 11/11 requiring defibrillation. Pharmacy to manage electrolytes.  Patient intubated 11/14.  Goal of Therapy:  Electrolytes WNL  Plan:  Electrolytes stable. Continue to follow along.  Pricilla Riffle ,PharmD Clinical Pharmacist 01/24/2020 12:20 PM

## 2020-01-24 NOTE — Progress Notes (Signed)
Progress Note  Patient Name: Travis Maxwell Date of Encounter: 01/24/2020  Primary Cardiologist: Kathlyn Sacramento, MD  Subjective   Remains intubated and sedated.  He underwent right and left cardiac catheterization this morning which showed patent RCA stent in a nondominant vessel and no evidence of obstructive disease affecting the left coronary arteries.  Right heart catheterization showed a wedge pressure of 15 mmHg, normal pulmonary pressure and normal cardiac output.  Inpatient Medications    Scheduled Meds: . allopurinol  300 mg Per Tube Daily  . aspirin  81 mg Per Tube Daily  . atorvastatin  40 mg Per Tube QHS  . carvedilol  3.125 mg Oral BID WC  . chlorhexidine gluconate (MEDLINE KIT)  15 mL Mouth Rinse BID  . Chlorhexidine Gluconate Cloth  6 each Topical Daily  . docusate  100 mg Per Tube BID  . feeding supplement (PROSource TF)  45 mL Per Tube Daily  . fentaNYL (SUBLIMAZE) injection  25 mcg Intravenous Once  . furosemide  20 mg Intravenous Daily  . insulin aspart  0-9 Units Subcutaneous Q4H  . linezolid  600 mg Per Tube Q12H  . mouth rinse  15 mL Mouth Rinse 10 times per day  . polyethylene glycol  17 g Per Tube Daily  . scopolamine  1 patch Transdermal Q72H  . senna-docusate  1 tablet Per Tube BID  . sodium chloride flush  10-40 mL Intracatheter Q12H  . sodium chloride flush  3 mL Intravenous Q12H   Continuous Infusions: . amiodarone 30 mg/hr (01/24/20 1349)  . ceFEPime (MAXIPIME) IV 2 g (01/24/20 1352)  . famotidine (PEPCID) IV 20 mg (01/24/20 1142)  . feeding supplement (VITAL AF 1.2 CAL) Stopped (01/23/20 2345)  . fentaNYL infusion INTRAVENOUS 150 mcg/hr (01/24/20 1019)  . heparin    . midazolam 5 mg/hr (01/24/20 1019)  . phenylephrine (NEO-SYNEPHRINE) Adult infusion Stopped (01/24/20 0319)   PRN Meds: acetaminophen, albuterol, diazepam, fentaNYL, haloperidol lactate, metoprolol tartrate, ondansetron (ZOFRAN) IV, oxyCODONE, polyethylene glycol   Vital  Signs    Vitals:   01/24/20 1000 01/24/20 1200 01/24/20 1300 01/24/20 1400  BP: (!) 141/72 (!) 150/65 119/64 138/60  Pulse: 82 84 80 83  Resp: 15 19 20 19   Temp: 98.4 F (36.9 C) 98.2 F (36.8 C) 98.4 F (36.9 C) 98.4 F (36.9 C)  TempSrc:      SpO2: 94% 99% 97% 100%  Weight:      Height:        Intake/Output Summary (Last 24 hours) at 01/24/2020 1515 Last data filed at 01/24/2020 1400 Gross per 24 hour  Intake 2448.85 ml  Output 2285 ml  Net 163.85 ml   Filed Weights   01/23/20 0337 01/24/20 0323 01/24/20 0900  Weight: 72.6 kg 71.9 kg 71.9 kg    Physical Exam   GEN: Well nourished, well developed, in no acute distress. Intubated/sedated.  HEENT: Grossly normal.  Neck: Supple, no JVD, carotid bruits, or masses. Cardiac: RRR, distant, 2/6 syst murmur along left sternal border no rubs, or gallops. No clubbing, cyanosis, edema.  Radials/DP/PT 2+ and equal bilaterally.  Respiratory:  Respirations regular and unlabored, ventilated breath sounds. GI: Soft, nontender, nondistended, BS + x 4. MS: no deformity or atrophy. Skin: warm and dry, no rash. Neuro:  Sedated. Opens eyes briefly to name/touch. Psych: Sedated.  Labs    Chemistry Recent Labs  Lab 01/22/20 1610 01/23/20 0138 01/24/20 0425  NA 138 138 139  K 4.3 4.2 4.0  CL 104 106  107  CO2 23 21* 26  GLUCOSE 214* 203* 179*  BUN 28* 27* 25*  CREATININE 0.88 0.82 0.74  CALCIUM 8.4* 8.3* 8.6*  GFRNONAA >60 >60 >60  ANIONGAP 11 11 6      Hematology Recent Labs  Lab 01/22/20 0414 01/23/20 0138 01/24/20 0425  WBC 19.1* 17.5* 15.2*  RBC 6.74* 7.16* 7.01*  HGB 13.9 14.7 14.4  HCT 47.0 49.9 48.7  MCV 69.7* 69.7* 69.5*  MCH 20.6* 20.5* 20.5*  MCHC 29.6* 29.5* 29.6*  RDW 26.1* 26.9* 26.8*  PLT 874* 968* 917*    Cardiac Enzymes  Recent Labs  Lab 01/09/20 0152 01/16/20 0245 01/16/20 0702 01/16/20 0838 01/19/20 0433  TROPONINIHS 89* 934* 711* 674* 234*      Lipids  Lab Results  Component Value  Date   CHOL 106 12/01/2019   HDL 42 12/01/2019   LDLCALC 57 12/01/2019   TRIG 65 01/24/2020   CHOLHDL 2.5 12/01/2019    HbA1c  Lab Results  Component Value Date   HGBA1C 6.4 (H) 11/30/2019    Radiology    DG Abd 1 View  Result Date: 01/20/2020 CLINICAL DATA:  69 year old male status post NG placement. EXAM: ABDOMEN - 1 VIEW COMPARISON:  Abdominal radiograph dated 01/19/2020. FINDINGS: Enteric tube with tip and side-port in the left upper abdomen. The tip of the enteric tube is likely at the gastric fundus. Air is noted throughout the colon. Cardiomegaly with mechanical cardiac valve. IMPRESSION: Enteric tube with tip likely in the region of the gastric fundus. Electronically Signed   By: Anner Crete M.D.   On: 01/20/2020 15:57   DG Chest Port 1 View  Result Date: 01/23/2020 CLINICAL DATA:  Endotracheal tube present. EXAM: PORTABLE CHEST 1 VIEW COMPARISON:  01/19/2020 FINDINGS: Endotracheal tube is 2.2 cm above the carina. Nasogastric tube extends into the upper abdomen and terminates in the gastric fundus region. Heart size is mildly enlarged but unchanged. Right arm PICC terminates in the SVC region. Patient has a prosthetic heart valve and previous median sternotomy. New patchy densities at the right lung base. Negative for pneumothorax. Slightly decreased lung volumes. 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 IMPRESSION: 1. New patchy densities at the right lung base. Findings could represent atelectasis or developing infection. Upper lungs are clear. 2. Endotracheal tube is appropriately positioned. Electronically Signed   By: Markus Daft M.D.   On: 01/23/2020 07:48   DG Chest Port 1 View  Result Date: 01/19/2020 CLINICAL DATA:  OG tube placement. EXAM: PORTABLE CHEST 1 VIEW COMPARISON:  Earlier same day FINDINGS: 0820 hours. Leftward patient rotation. The cardio pericardial silhouette is enlarged. NG tube/OG tube is  positioned in the proximal stomach with proximal side port just above the expected location of the esophagogastric junction. IMPRESSION: NG tube tip projects over the proximal stomach. Electronically Signed   By: Misty Stanley M.D.   On: 01/19/2020 09:21    Telemetry    RSR, PVC's - Personally Reviewed QTc 470  Cardiac Studies     Cath and PCI 07/25/2017  Mid RCA lesion is 99% stenosed.  Post intervention, there is a 0% residual stenosis.  A drug-eluting stent was successfully placed using a STENT RESOLUTE ONYX 2.0X12. Conclusion Successful PCI and stent of nondominant mid right with DES stent 2.0 x 12 mm resolute to 12 atm Lesion reduced from 99 down to 0% TIMI-3 flow before and after procedure _____________   Echo 08/13/19 1. Left ventricular ejection fraction,  by estimation, is <20%. The left  ventricle has severely decreased function. The left ventricle demonstrates  global hypokinesis. The left ventricular internal cavity size was severely  dilated. There is mild left  ventricular hypertrophy. Left ventricular diastolic parameters were  normal.  2. Right ventricular systolic function is normal. The right ventricular  size is normal.  3. Left atrial size was moderately dilated.  4. Right atrial size was mildly dilated.  5. The mitral valve is normal in structure. Moderate to severe mitral  valve regurgitation.  6. Tricuspid valve regurgitation is mild to moderate.  7. The aortic valve is normal in structure. Aortic valve regurgitation is  mild.  _____________  2D Echocardiogram 01/2020  1. Left ventricular ejection fraction, by estimation, is 20 to 25%. The  left ventricle has severely decreased function. The left ventricle  demonstrates regional wall motion abnormalities (see scoring  diagram/findings for description). The left  ventricular internal cavity size was moderately dilated. There is mild  left ventricular hypertrophy. Left ventricular diastolic  parameters are  consistent with Grade I diastolic dysfunction (impaired relaxation).  Elevated left atrial pressure. There is  severe global hypokinesis with relative sparing of the basal segments.  2. Right ventricular systolic function is moderately reduced. The right  ventricular size is mildly enlarged. Tricuspid regurgitation signal is  inadequate for assessing PA pressure.  3. Left atrial size was mildly dilated.  4. The mitral valve is grossly normal. Mild mitral valve regurgitation.  No evidence of mitral stenosis.  5. The aortic valve was not well visualized. Aortic valve regurgitation  is not visualized. Echo findings are consistent with normal structure and  function of the aortic valve prosthesis.  6. Pulmonic valve regurgitation not well assessed.  _____________    Patient Profile     69 y.o. 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  Assessment & Plan    1.  Ventricular tachycardia arrest: Patient with multiple episodes of VT during admission requiring defibrillation and antiarrhythmic therapy.  Seen by EP with recommendation to repeat lidocaine level and add amiodarone.  Lidocaine level elevated 7.2 on November 16 and this was reduced to 1 mg/min.  Amiodarone subsequently added.  No recurrent VT in the past 48 hours.  I elected to discontinue lidocaine drip and continue amiodarone drip.  Keep the patient  intubated today to ensure no recurrent ventricular tachycardia and if he has no significant arrhythmia, attempted extubation can be done tomorrow with plans to transition to oral amiodarone load.  Ultimately, the patient will need evaluation for an ICD placement.  2.  Acute on Chronic heart failure with reduced ejection fraction due to nonischemic cardiomyopathy. Cardiac catheterization today showed no obstructive disease. I elected to add small dose carvedilol given that he is off pressors.  This might help with his ventricular tachycardia. Volume status appears optimal on current dose of IV furosemide 20  mg daily. I think we should try to maximize the dose of beta-blocker before introducing an ACE inhibitor, ARB or Entresto.  3.  PAF:  Maintaining sinus.  Heparin drip to be resumed later today and likely we can transition back to oral Eliquis in the next few days if no plans for invasive procedures.  4. Acute hypoxic resp failure:  Remains intubated/sedated.  Per CCM. Trach culture 11/14 + for MRSA and pseudomonas  abx per CCM.  Signed, Kathlyn Sacramento, MD  01/24/2020, 3:15 PM    For questions or updates, please contact   Please consult [email or web address] for contact info under Cardiology/STEMI.

## 2020-01-25 DIAGNOSIS — I5023 Acute on chronic systolic (congestive) heart failure: Secondary | ICD-10-CM | POA: Diagnosis not present

## 2020-01-25 DIAGNOSIS — I472 Ventricular tachycardia: Secondary | ICD-10-CM | POA: Diagnosis not present

## 2020-01-25 DIAGNOSIS — J9601 Acute respiratory failure with hypoxia: Secondary | ICD-10-CM | POA: Diagnosis not present

## 2020-01-25 LAB — GLUCOSE, CAPILLARY
Glucose-Capillary: 131 mg/dL — ABNORMAL HIGH (ref 70–99)
Glucose-Capillary: 133 mg/dL — ABNORMAL HIGH (ref 70–99)
Glucose-Capillary: 155 mg/dL — ABNORMAL HIGH (ref 70–99)
Glucose-Capillary: 210 mg/dL — ABNORMAL HIGH (ref 70–99)
Glucose-Capillary: 248 mg/dL — ABNORMAL HIGH (ref 70–99)
Glucose-Capillary: 95 mg/dL (ref 70–99)

## 2020-01-25 LAB — PHOSPHORUS: Phosphorus: 2.8 mg/dL (ref 2.5–4.6)

## 2020-01-25 LAB — COMPREHENSIVE METABOLIC PANEL
ALT: 19 U/L (ref 0–44)
AST: 40 U/L (ref 15–41)
Albumin: 2.5 g/dL — ABNORMAL LOW (ref 3.5–5.0)
Alkaline Phosphatase: 97 U/L (ref 38–126)
Anion gap: 11 (ref 5–15)
BUN: 27 mg/dL — ABNORMAL HIGH (ref 8–23)
CO2: 25 mmol/L (ref 22–32)
Calcium: 8.9 mg/dL (ref 8.9–10.3)
Chloride: 105 mmol/L (ref 98–111)
Creatinine, Ser: 0.83 mg/dL (ref 0.61–1.24)
GFR, Estimated: >60 mL/min (ref >60)
Glucose, Bld: 119 mg/dL — ABNORMAL HIGH (ref 70–99)
Potassium: 3.1 mmol/L — ABNORMAL LOW (ref 3.5–5.1)
Sodium: 141 mmol/L (ref 135–145)
Total Bilirubin: 0.8 mg/dL (ref 0.3–1.2)
Total Protein: 7.1 g/dL (ref 6.5–8.1)

## 2020-01-25 LAB — CBC WITH DIFFERENTIAL/PLATELET
Abs Immature Granulocytes: 0.3 10*3/uL — ABNORMAL HIGH (ref 0.00–0.07)
Basophils Absolute: 0.2 10*3/uL — ABNORMAL HIGH (ref 0.0–0.1)
Basophils Relative: 2 %
Eosinophils Absolute: 0.8 10*3/uL — ABNORMAL HIGH (ref 0.0–0.5)
Eosinophils Relative: 5 %
HCT: 46 % (ref 39.0–52.0)
Hemoglobin: 13.8 g/dL (ref 13.0–17.0)
Immature Granulocytes: 2 %
Lymphocytes Relative: 13 %
Lymphs Abs: 1.9 10*3/uL (ref 0.7–4.0)
MCH: 20.7 pg — ABNORMAL LOW (ref 26.0–34.0)
MCHC: 30 g/dL (ref 30.0–36.0)
MCV: 69 fL — ABNORMAL LOW (ref 80.0–100.0)
Monocytes Absolute: 1.3 10*3/uL — ABNORMAL HIGH (ref 0.1–1.0)
Monocytes Relative: 9 %
Neutro Abs: 10.3 10*3/uL — ABNORMAL HIGH (ref 1.7–7.7)
Neutrophils Relative %: 69 %
Platelets: 931 10*3/uL (ref 150–400)
RBC: 6.67 MIL/uL — ABNORMAL HIGH (ref 4.22–5.81)
RDW: 27.1 % — ABNORMAL HIGH (ref 11.5–15.5)
Smear Review: INCREASED
WBC: 14.8 10*3/uL — ABNORMAL HIGH (ref 4.0–10.5)
nRBC: 0.1 % (ref 0.0–0.2)

## 2020-01-25 LAB — BLOOD GAS, ARTERIAL
Acid-Base Excess: 0.9 mmol/L (ref 0.0–2.0)
Bicarbonate: 25.3 mmol/L (ref 20.0–28.0)
Delivery systems: POSITIVE
Expiratory PAP: 6
FIO2: 1
Inspiratory PAP: 10
O2 Saturation: 99.8 %
Patient temperature: 37
RATE: 12 resp/min
pCO2 arterial: 39 mmHg (ref 32.0–48.0)
pH, Arterial: 7.42 (ref 7.350–7.450)
pO2, Arterial: 246 mmHg — ABNORMAL HIGH (ref 83.0–108.0)

## 2020-01-25 LAB — MAGNESIUM
Magnesium: 1.6 mg/dL — ABNORMAL LOW (ref 1.7–2.4)
Magnesium: 1.6 mg/dL — ABNORMAL LOW (ref 1.7–2.4)

## 2020-01-25 LAB — HEPARIN LEVEL (UNFRACTIONATED): Heparin Unfractionated: 0.79 IU/mL — ABNORMAL HIGH (ref 0.30–0.70)

## 2020-01-25 LAB — APTT: aPTT: 78 seconds — ABNORMAL HIGH (ref 24–36)

## 2020-01-25 LAB — TRIGLYCERIDES: Triglycerides: 83 mg/dL (ref <150)

## 2020-01-25 MED ORDER — LORAZEPAM 2 MG/ML IJ SOLN: 1.0000 mg | Freq: Once | INTRAMUSCULAR | Status: AC

## 2020-01-25 MED ORDER — POTASSIUM CHLORIDE 10 MEQ/100ML IV SOLN
10.0000 meq | INTRAVENOUS | Status: AC
  Administered 2020-01-25 – 2020-01-26 (×4): 10 meq via INTRAVENOUS
  Filled 2020-01-25 (×4): qty 100

## 2020-01-25 MED ORDER — MAGNESIUM SULFATE 2 GM/50ML IV SOLN
2.0000 g | Freq: Once | INTRAVENOUS | Status: AC
  Administered 2020-01-25: 2 g via INTRAVENOUS
  Filled 2020-01-25: qty 50

## 2020-01-25 MED ORDER — AMIODARONE IV BOLUS ONLY 150 MG/100ML
150.0000 mg | Freq: Once | INTRAVENOUS | Status: AC
  Administered 2020-01-25: 150 mg via INTRAVENOUS

## 2020-01-25 MED ORDER — LORAZEPAM 2 MG/ML IJ SOLN
INTRAMUSCULAR | Status: AC
  Administered 2020-01-25: 1 mg via INTRAVENOUS
  Filled 2020-01-25: qty 1

## 2020-01-25 MED ORDER — FUROSEMIDE 10 MG/ML IJ SOLN
40.0000 mg | Freq: Once | INTRAMUSCULAR | Status: AC
  Administered 2020-01-25: 40 mg via INTRAVENOUS
  Filled 2020-01-25: qty 4

## 2020-01-25 MED ORDER — GLYCOPYRROLATE 0.2 MG/ML IJ SOLN
0.1000 mg | Freq: Once | INTRAMUSCULAR | Status: AC
  Administered 2020-01-25: 0.1 mg via INTRAVENOUS
  Filled 2020-01-25: qty 1
  Filled 2020-01-25 (×2): qty 0.5

## 2020-01-25 MED ORDER — METOPROLOL TARTRATE 5 MG/5ML IV SOLN: 5.0000 mg | Freq: Once | INTRAVENOUS | Status: AC

## 2020-01-25 NOTE — Progress Notes (Signed)
ANTICOAGULATION CONSULT NOTE   Pharmacy Consult for heparin Indication: atrial fibrillation  Allergies  Allergen Reactions  . Penicillins Other (See Comments)    Has patient had a PCN reaction causing immediate rash, facial/tongue/throat swelling, SOB or lightheadedness with hypotension: Unknown Has patient had a PCN reaction causing severe rash involving mucus membranes or skin necrosis: Unknown Has patient had a PCN reaction that required hospitalization: Unknown Has patient had a PCN reaction occurring within the last 10 years: No If all of the above answers are "NO", then may proceed with Cephalosporin use.     Patient Measurements: Height: 5' 7.01" (170.2 cm) Weight: 72 kg (158 lb 11.7 oz) IBW/kg (Calculated) : 66.12 Heparin Dosing Weight: 67 kg  Vital Signs: Temp: 98.6 F (37 C) (11/20 0500) BP: 102/62 (11/20 0500) Pulse Rate: 66 (11/20 0500)  Labs: Recent Labs    01/22/20 1610 01/23/20 0008 01/23/20 0138 01/23/20 0138 01/23/20 0946 01/23/20 0946 01/23/20 1617 01/24/20 0425 01/25/20 0415  HGB  --   --  14.7   < >  --   --   --  14.4 13.8  HCT  --   --  49.9  --   --   --   --  48.7 46.0  PLT  --   --  968*  --   --   --   --  917* 931*  APTT 53*   < > 151*   < > 76*   < > 89* 76* 78*  HEPARINUNFRC  --   --  1.31*   < > 0.98*  --   --  0.88* 0.79*  CREATININE 0.88  --  0.82  --   --   --   --  0.74  --    < > = values in this interval not displayed.    Estimated Creatinine Clearance: 83.8 mL/min (by C-G formula based on SCr of 0.74 mg/dL).   Medical History: Past Medical History:  Diagnosis Date  . CAD (coronary artery disease)   . Chronic systolic (congestive) heart failure (HCC)   . Diabetes mellitus without complication (HCC)   . GERD (gastroesophageal reflux disease)   . Gout   . Hx of aortic valve replacement    Bioprosthetic valve  . Hypertension       Assessment: 69 year old male with h/o CAD, HFrEF, aortic regurgitation and stenosis s/p  bioprosthetic aortic valve, afib on Eliquis. Patient with episodes of ventricular tachycardia requiring defibrillation multiple times. Patient has been on Eliquis here. Plan is for heart cath Friday after appropriate Eliquis washout. Plan to start heparin in interim.  11/17 0414 aPTT 65 11/17 1610 aPTT 53 11/18 0138 aPTT 151 11/18 0946 aPTT 76, HL 0.98  11/18 1617 aPTT 89 11/19 0425 aPTT 76, HL 0.88  11/20 0415 aPTT 78, HL 0.79   Goal of Therapy:  Heparin level 0.3-0.7 units/ml aPTT 66-102 seconds Monitor platelets by anticoagulation protocol: Yes   Plan:  11/20 @ 0415:  APTT = 78,  HL = 0.79 Will continue pt on current rate and recheck aPTT and HL on 11/21 AM labs.   Thurma Priego D 01/25/2020 5:19 AM

## 2020-01-25 NOTE — Progress Notes (Signed)
Pt. Extubated to 4 lnc,hr 135,rr 28,sat 94%.

## 2020-01-25 NOTE — Progress Notes (Signed)
PHARMACY CONSULT NOTE - FOLLOW UP  Pharmacy Consult for Electrolyte Monitoring and Replacement   Recent Labs: Potassium (mmol/L)  Date Value  01/25/2020 3.1 (L)   Magnesium (mg/dL)  Date Value  37/12/6267 1.6 (L)   Calcium (mg/dL)  Date Value  48/54/6270 8.9   Albumin (g/dL)  Date Value  35/00/9381 2.5 (L)   Phosphorus (mg/dL)  Date Value  82/99/3716 2.8   Sodium (mmol/L)  Date Value  01/25/2020 141   Assessment: 69 year old male transferred to ICU for acute toxic metabolic encephalopathy. Patient now more alert and awake. Overnight 11/10, patient with two runs pulseless VT, with second episode requiring CPR and defibrillation. Converted back into baseline bundle branch rhythm with rate in 50's-60's.  Pt received magnesium 2 gm during episode. Similar episodes 11/11 requiring defibrillation. Pharmacy to manage electrolytes.  Patient intubated 11/14.   RN called pharmacy to notify that PM labs reveal Mg level 1.6, K level 3.1, both below goal range.   Goal of Therapy:  Electrolytes WNL  Plan:  Mg 2g IV x1 K 10 meq IV x 4 Recheck electrolytes with daily AM labs  Doroteo Glassman ,PharmD Clinical Pharmacist 01/25/2020 6:55 PM

## 2020-01-25 NOTE — Progress Notes (Signed)
Progress Note  Patient Name: Travis Maxwell Date of Encounter: 01/25/2020  Primary Cardiologist: Kathlyn Sacramento, MD  Subjective   Remains intubated/sedated. Plan for weaning trial today.  Inpatient Medications    Scheduled Meds:  allopurinol  300 mg Per Tube Daily   aspirin  81 mg Per Tube Daily   atorvastatin  40 mg Per Tube QHS   carvedilol  3.125 mg Oral BID WC   chlorhexidine gluconate (MEDLINE KIT)  15 mL Mouth Rinse BID   Chlorhexidine Gluconate Cloth  6 each Topical Daily   docusate  100 mg Per Tube BID   feeding supplement (PROSource TF)  45 mL Per Tube Daily   fentaNYL (SUBLIMAZE) injection  25 mcg Intravenous Once   furosemide  20 mg Intravenous Daily   insulin aspart  0-9 Units Subcutaneous Q4H   linezolid  600 mg Per Tube Q12H   mouth rinse  15 mL Mouth Rinse 10 times per day   polyethylene glycol  17 g Per Tube Daily   scopolamine  1 patch Transdermal Q72H   senna-docusate  1 tablet Per Tube BID   sodium chloride flush  10-40 mL Intracatheter Q12H   sodium chloride flush  3 mL Intravenous Q12H   Continuous Infusions:  sodium chloride Stopped (01/25/20 0505)   amiodarone 30 mg/hr (01/25/20 0505)   ceFEPime (MAXIPIME) IV 2 g (01/25/20 0505)   famotidine (PEPCID) IV Stopped (01/24/20 1212)   feeding supplement (VITAL AF 1.2 CAL) 1,000 mL (01/24/20 1651)   fentaNYL infusion INTRAVENOUS 150 mcg/hr (01/25/20 0505)   heparin 1,100 Units/hr (01/25/20 0505)   midazolam 4 mg/hr (01/25/20 0505)   phenylephrine (NEO-SYNEPHRINE) Adult infusion Stopped (01/24/20 0319)   PRN Meds: sodium chloride, acetaminophen, albuterol, diazepam, fentaNYL, haloperidol lactate, metoprolol tartrate, ondansetron (ZOFRAN) IV, oxyCODONE, polyethylene glycol   Vital Signs    Vitals:   01/25/20 0325 01/25/20 0400 01/25/20 0437 01/25/20 0500  BP:  103/66  102/62  Pulse:  68  66  Resp:  20  19  Temp:  98.4 F (36.9 C)  98.6 F (37 C)  TempSrc:       SpO2: 100% 99%  99%  Weight:   72 kg   Height:        Intake/Output Summary (Last 24 hours) at 01/25/2020 0827 Last data filed at 01/25/2020 0505 Gross per 24 hour  Intake 1456.93 ml  Output 1725 ml  Net -268.07 ml   Filed Weights   01/24/20 0323 01/24/20 0900 01/25/20 0437  Weight: 71.9 kg 71.9 kg 72 kg    Physical Exam   GEN:  Intubated/sedated. Well nourished, well developed, in no acute distress.  HEENT: Grossly normal.  Neck: Supple, no JVD, carotid bruits, or masses. Cardiac: RRR, 2/6 syst murmur throughout. No rubs, or gallops. No clubbing, cyanosis, edema.  Radials/DP/PT 2+ and equal bilaterally.  Respiratory:  Respirations regular and unlabored, coarse/ventilated breath sounds. GI: Soft, nontender, nondistended, BS + x 4. MS: no deformity or atrophy. Skin: warm and dry, no rash. Neuro:  Sedated - nonresponsive Psych: sedated  Labs    Chemistry Recent Labs  Lab 01/22/20 1610 01/23/20 0138 01/24/20 0425  NA 138 138 139  K 4.3 4.2 4.0  CL 104 106 107  CO2 23 21* 26  GLUCOSE 214* 203* 179*  BUN 28* 27* 25*  CREATININE 0.88 0.82 0.74  CALCIUM 8.4* 8.3* 8.6*  GFRNONAA >60 >60 >60  ANIONGAP 11 11 6      Hematology Recent Labs  Lab 01/23/20 [phone]  01/24/20 0425 01/25/20 0415  WBC 17.5* 15.2* 14.8*  RBC 7.16* 7.01* 6.67*  HGB 14.7 14.4 13.8  HCT 49.9 48.7 46.0  MCV 69.7* 69.5* 69.0*  MCH 20.5* 20.5* 20.7*  MCHC 29.5* 29.6* 30.0  RDW 26.9* 26.8* 27.1*  PLT 968* 917* 931*    Cardiac Enzymes  Recent Labs  Lab 01/09/20 0152 01/16/20 0245 01/16/20 0702 01/16/20 0838 01/19/20 0433  TROPONINIHS 89* 934* 711* 674* 234*      Lipids  Lab Results  Component Value Date   CHOL 106 12/01/2019   HDL 42 12/01/2019   LDLCALC 57 12/01/2019   TRIG 83 01/25/2020   CHOLHDL 2.5 12/01/2019    HbA1c  Lab Results  Component Value Date   HGBA1C 6.4 (H) 11/30/2019    Radiology    DG Chest Port 1 View  Result Date: 01/23/2020 CLINICAL DATA:   Endotracheal tube present. EXAM: PORTABLE CHEST 1 VIEW COMPARISON:  01/19/2020 FINDINGS: Endotracheal tube is 2.2 cm above the carina. Nasogastric tube extends into the upper abdomen and terminates in the gastric fundus region. Heart size is mildly enlarged but unchanged. Right arm PICC terminates in the SVC region. Patient has a prosthetic heart valve and previous median sternotomy. New patchy densities at the right lung base. Negative for pneumothorax. Slightly decreased lung volumes. 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 IMPRESSION: 1. New patchy densities at the right lung base. Findings could represent atelectasis or developing infection. Upper lungs are clear. 2. Endotracheal tube is appropriately positioned. Electronically Signed   By: Markus Daft M.D.   On: 01/23/2020 07:48    Telemetry    RSR/sinus brady, rare PVCs/couplets.  No VT - Personally Reviewed  Cardiac Studies   2D Echocardiogram 11.19.2021   1. Left ventricular ejection fraction, by estimation, is 20 to 25%. The  left ventricle has severely decreased function. The left ventricle  demonstrates regional wall motion abnormalities (see scoring  diagram/findings for description). The left  ventricular internal cavity size was moderately dilated. There is mild  left ventricular hypertrophy. Left ventricular diastolic parameters are  consistent with Grade I diastolic dysfunction (impaired relaxation).  Elevated left atrial pressure. There is  severe global hypokinesis with relative sparing of the basal segments.   2. Right ventricular systolic function is moderately reduced. The right  ventricular size is mildly enlarged. Tricuspid regurgitation signal is  inadequate for assessing PA pressure.   3. Left atrial size was mildly dilated.   4. The mitral valve is grossly normal. Mild mitral valve regurgitation.  No evidence of mitral stenosis.   5. The aortic valve was not  well visualized. Aortic valve regurgitation  is not visualized. Echo findings are consistent with normal structure and  function of the aortic valve prosthesis.   6. Pulmonic valve regurgitation not well assessed.  _____________  Cardiac Catheterization  11.19.2021  Left Main  Vessel is angiographically normal.  Left Anterior Descending  First Diagonal Branch  Vessel is angiographically normal.  Second Diagonal Branch  Vessel is angiographically normal.  Third Diagonal Branch  Vessel is angiographically normal.  Ramus Intermedius  Vessel is angiographically normal.  Left Circumflex  The vessel exhibits minimal luminal irregularities.  Right Coronary Artery  Prox RCA lesion is 50% stenosed.  Previously placed Prox RCA to Mid RCA stent (unknown type) is widely patent.   Result Date: 01/24/2020  Previously placed Prox RCA to Mid RCA stent (unknown type) is widely patent.  Prox RCA lesion is  50% stenosed.  1.  Patent stent in a nondominant right coronary artery.  No significant coronary artery disease overall. 2.  The replaced aortic valve was not crossed. 3.  Right heart catheterization showed mildly elevated filling pressures, minimal pulmonary hypertension and normal cardiac output. Recommendations: The patient has no ischemic substrate for ventricular tachycardia.  He has nonischemic cardiomyopathy. His hemodynamics appear good overall.  Recommend continuing same dose of IV furosemide 20 mg daily which can be switched to an oral diuretic after extubation. I am going to stop lidocaine drip and continue amiodarone drip which can subsequently be switched to oral after extubation. I elected to add small dose carvedilol to assist with his cardiomyopathy and ventricular tachycardia. _____________   Patient Profile     69 y.o. 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 Cath 11/19 w/ nonobs dzs and patent RCA stent  med rx. Lido d/c'd.  Assessment & Plan    1. VT Arrest:  Multiple episodes of VT during admission req defib and AAD therapy. Seen by EP earlier this week and placed back on Amio.  Lido off yesterday after cath w/o significant dzs.  No recurrent VT overnight.  Modified QT is ~ 460 [QT - (QRS/2)] by ECG this AM.  Rare PVCs/couplets overnight.  3 beats NSVT on tele this AM.  Cont amio gtt for now w/ plan to transition to PO once taking orals.  Will need 10g load and thus far, he has received ~ 2g since resumption on 11/17.  2.  Acute on chronic heart failure w/ reduced EF/mixed ICM/NICM:  Volume looks good. Mildly elev pressures on RHC yesterday (PCWP 19).  Cont current dose of IV lasix w/ plan to transition to PO once taking POs.  Pressures stable since adding back  blocker. Follow pressures and look for opportunities to add arb/entresto and mra going forward.  3.  CAD: s/p prior RCA stenting - patent on cath 11/19.  No other obs dzs on cath. Cont med rx - asa, statin,  blocker.  4.  PAF: maintaining sinus. Currently on IV amio/heparin, and  blocker.  [id number] = 4.  Eventual transition back to oral eliquis following extubation and once it's clear that he will not require additional invasive procedures.  5.  Acute hypoxic resp failure/MRSA/pseudomonas PNA:  Remains intubated. Abx per ccm.  Weaning trials planned for today.  Signed, CMurray Hodgkins NP  01/25/2020, 8:27 AM    For questions or updates,  please contact   Please consult [email or web address] for contact info under Cardiology/STEMI.

## 2020-01-25 NOTE — Progress Notes (Signed)
CRITICAL CARE NOTE  [name] presenting to the ED on 01/06/2020 in respiratory distress, complaining of chest pain and shortness of breath. He also reported orthopnea and increased lower extremity swelling. History was limited due to acuity of condition and respiratory status. Patient was placed on biPAP, but progressively worsened and was emergently intubated. Patient is a current 0.5ppd smoker and uses marijuana. He has a severe metabolic acidosis. Patient has a past medical history significant for HTN, T2DM, HLD, GERD, CAD (s/p PCI), AVR with bioprosthetic valve, systolic CHF with EF of 70%, and paroxysmal A. Fib, currently anticoagulated on Eliquis. EKG upon arrival to ED revealed sinus rhythm at a rate of 99bpm with RBBB and PVCs with ST elevation in II, III, avF, V4-V6. Cardiologist on call stated that STEMI criteria were not met and advised to manage CHF. Patient received IV diltiazem and reported improvement of symptoms. Labs were notable for high sensitivity troponin 71->91->89. BNP 1046, creatinine 1.26, BUN 21, potassium 5.4. CXR showed improved pulmonary edema since prior study with chronic pulmonary venous hypertension. Notably, patient was admitted to [facility] ED 12/04/2019 for acute respiratory failure and acute on chronic systolic CHF in the setting of COVID pneumonia.   SIGNIFICANT EVENTS  11/02 - severe cardiogenic shock, remains on vent 11/03 - severe respiratory failure, severe systolic CHF 26/37 - patient remains critically ill 11/05 - patient remains critically ill 11/06 - off pressors, extubated to biPAP 11/07 - remains extubated, febrile, MSSA+ tracheal culture - started ancef 11/08 - remains extubated, on 2L by nasal cannula. Afebrile today with white count of 21.3K, up from 15.7K yesterday. Still encephalopathic and drowsy. On and off Precedex. Went into narrow complex SVT,then progressed to wide complex tachycardiawas shocked and given a loading dose of amiodarone. Will  remain on amiodarone gtt.Cardiology at bedside.  11/09 - remains extubated, on 2L by nasal cannula. Afebrile today with white count of 19.2K, down since yesterday. BP soft overnight, was started on Neo-synephrine infusion. More alert than yesterday, but only oriented to self. Failed Yale 3oz swallow study early this morning, started coughing at the end of the study. Will need to remain NPO until cleared by SLP. Patient has been educated on this, yet continues to incessantly ask for water.  11/10 - patient clinically improved. He is alert communicative and in no distress. He ate entire meal without issues. Still has confusion intermittently during interview. Frequent PVCs on telemetry.  Will initiate OT/PT today for downgrade planning. 11/11 - patient minimally symptomatic, he again had episodes of Torsades today s/p 120Jx1 mag 2g.  Poor prognosis overall with advanced CHF.  11/12 - Dicussed case with cardiology. Antiarrythmic agents being modified. Will also call consult for Electrophysiology  11/13 - Patient with no arrythmia overnight. He remains on precedex and neosynephrine.  I spoke with cardiology Dr Rayann Heman regarding EP evaluation- he recommended to continue with current medical therapy and have cardiologist call to EP on Monday to review options.  He has 1:1 sitter and is resting in bed comfortably. Spoke with Velva Harman POA daughter to update on poor prognosis worsening clinical condition.  11/14 - further deterioration overnight.  Patient required reintubation and MV.  Updated next of kin daughter Velva Harman this am and sent message to cardiology to discuss poor prognosis with family.    11/15 - remains intubated and sedated, high risk for cardiac arrest and death February 18, 2023 - remains intubated and sedated, frequent PVCs overnight, 2g magnesium given, awaiting EP consult, patient becomes tachypneic and diaphoretic during  SBTs 11/17 - increased ectopy, seen by EP who advised weaning lidocaine and restarting  amiodarone, planning for right & left heart cath on Friday 11/19 with Dr. Fletcher Anon, discontinue Eliquis, start IV heparin interim, tracheal culture 11/14 positive for MRSA and pseudomonas, started cefepime and linezolid 11/18 - remains intubated and sedated, on continuous infusion of amiodarone and lidocaine, metoprolol discontinued by cardiology due to low blood pressures, right/left heart cath scheduled for tomorrow 11/19- pt s/p L&RHC no obstuctive lesions, RHC with normal pressures. Patient will be treated medically with transition to PO amio and plan for SBT with attempt to liberate.  11/20- patient was liberated from ventilator.  He had persistent VT post extubation , was fortunate to have cardiologist present on time and had immediate medical intervention with improvement. Still very poor prognosis long term. Spoke to granddaugther at bedside requested family meeting to re-address goals of care due to limited therapy available for patietn.    CC  Follow up respiratory failure  SUBJECTIVE Patient remains critically ill Prognosis is guarded Remains on pressors   BP 140/71   Pulse 88   Temp 99.3 F (37.4 C)   Resp (!) 24   Ht 5' 7.01" (1.702 m)   Wt 72 kg   SpO2 99%   BMI 24.85 kg/m    I/O last 3 completed shifts: In: 2137.1 [I.V.:1687; IV Piggyback:450.1] Out: 2185 [Urine:2185] Total I/O In: 762.2 [I.V.:512.2; IV Piggyback:250] Out: 950 [Urine:950]  SpO2: 99 % O2 Flow Rate (L/min): 2 L/min FiO2 (%): 28 %  Estimated body mass index is 24.85 kg/m as calculated from the following:   Height as of this encounter: 5' 7.01" (1.702 m).   Weight as of this encounter: 72 kg.  SIGNIFICANT EVENTS   REVIEW OF SYSTEMS  PATIENT IS UNABLE TO PROVIDE COMPLETE REVIEW OF SYSTEMS DUE TO SEVERE CRITICAL ILLNESS   Pressure Injury 01/20/20 Sacrum Posterior;Medial Stage 2 -  Partial thickness loss of dermis presenting as a shallow open injury with a red, pink wound bed without slough.  (Active)  01/20/20 0800  Location: Sacrum  Location Orientation: Posterior;Medial  Staging: Stage 2 -  Partial thickness loss of dermis presenting as a shallow open injury with a red, pink wound bed without slough.  Wound Description (Comments):   Present on Admission: No    PHYSICAL EXAMINATION:  GENERAL: Critically ill appearing, intubated and sedated HEAD: Normocephalic, atraumatic.  EYES: Pupils equal, round, reactive to light.  No scleral icterus.  MOUTH: Moist mucosal membrane. NECK: Supple.  PULMONARY: rhonchorous breath sounds CARDIOVASCULAR: S1 and S2. Regular rate and rhythm. No murmurs, rubs, or gallops.  GASTROINTESTINAL: Soft, nontender, non-distended.  Positive bowel sounds.   MUSCULOSKELETAL: No swelling, clubbing, or edema.  NEUROLOGIC: confulsed GCS<10 SKIN: Intact, warm, dry  MEDICATIONS: I have reviewed all medications and confirmed regimen as documented Scheduled Meds: . allopurinol  300 mg Per Tube Daily  . aspirin  81 mg Per Tube Daily  . atorvastatin  40 mg Per Tube QHS  . carvedilol  3.125 mg Oral BID WC  . chlorhexidine gluconate (MEDLINE KIT)  15 mL Mouth Rinse BID  . Chlorhexidine Gluconate Cloth  6 each Topical Daily  . docusate  100 mg Per Tube BID  . feeding supplement (PROSource TF)  45 mL Per Tube Daily  . fentaNYL (SUBLIMAZE) injection  25 mcg Intravenous Once  . furosemide  20 mg Intravenous Daily  . insulin aspart  0-9 Units Subcutaneous Q4H  . linezolid  600 mg Per Tube  Q12H  . mouth rinse  15 mL Mouth Rinse 10 times per day  . polyethylene glycol  17 g Per Tube Daily  . scopolamine  1 patch Transdermal Q72H  . senna-docusate  1 tablet Per Tube BID  . sodium chloride flush  10-40 mL Intracatheter Q12H  . sodium chloride flush  3 mL Intravenous Q12H   Continuous Infusions: . sodium chloride 250 mL (01/25/20 1457)  . amiodarone 30 mg/hr (01/25/20 1400)  . ceFEPime (MAXIPIME) IV Stopped (01/25/20 1346)  . famotidine (PEPCID) IV Stopped  (01/25/20 0950)  . feeding supplement (VITAL AF 1.2 CAL) 1,000 mL (01/24/20 1651)  . fentaNYL infusion INTRAVENOUS Stopped (01/25/20 0955)  . heparin 1,100 Units/hr (01/25/20 1400)  . midazolam Stopped (01/25/20 0955)  . phenylephrine (NEO-SYNEPHRINE) Adult infusion Stopped (01/24/20 0319)   PRN Meds:.sodium chloride, acetaminophen, albuterol, diazepam, fentaNYL, haloperidol lactate, metoprolol tartrate, ondansetron (ZOFRAN) IV, oxyCODONE, polyethylene glycol   CULTURE RESULTS   Recent Results (from the past 240 hour(s))  Culture, respiratory     Status: None   Collection Time: 01/19/20  3:55 PM   Specimen: Tracheal Aspirate; Respiratory  Result Value Ref Range Status   Specimen Description   Final    TRACHEAL ASPIRATE Performed at [facility], [address] [place], [place] [address]    Special Requests   Final    NONE Performed at [facility], [address] [place], [place] [address]    Gram Stain   Final    MODERATE WBC PRESENT, PREDOMINANTLY PMN ABUNDANT GRAM POSITIVE COCCI IN PAIRS IN CLUSTERS ABUNDANT GRAM NEGATIVE RODS Performed at [facility], [place] [address] [place], [place] [address]    Culture   Final    ABUNDANT METHICILLIN RESISTANT STAPHYLOCOCCUS AUREUS MODERATE PSEUDOMONAS AERUGINOSA    Report Status 01/22/2020 FINAL  Final   Organism ID, Bacteria METHICILLIN RESISTANT STAPHYLOCOCCUS AUREUS  Final   Organism ID, Bacteria PSEUDOMONAS AERUGINOSA  Final      Susceptibility   Methicillin resistant staphylococcus aureus - MIC*    CIPROFLOXACIN <=0.5 SENSITIVE Sensitive     ERYTHROMYCIN >=8 RESISTANT Resistant     GENTAMICIN <=0.5 SENSITIVE Sensitive     OXACILLIN >=4 RESISTANT Resistant     TETRACYCLINE 2 SENSITIVE Sensitive     VANCOMYCIN 1 SENSITIVE Sensitive     TRIMETH/SULFA <=10 SENSITIVE Sensitive     CLINDAMYCIN 1 INTERMEDIATE Intermediate     RIFAMPIN 1 SENSITIVE Sensitive     Inducible Clindamycin NEGATIVE Sensitive      * ABUNDANT METHICILLIN RESISTANT STAPHYLOCOCCUS AUREUS   Pseudomonas aeruginosa - MIC*    CEFTAZIDIME 2 SENSITIVE Sensitive     CIPROFLOXACIN <=0.25 SENSITIVE Sensitive     GENTAMICIN <=1 SENSITIVE Sensitive     IMIPENEM 1 SENSITIVE Sensitive     PIP/TAZO <=4 SENSITIVE Sensitive     CEFEPIME 2 SENSITIVE Sensitive     * MODERATE PSEUDOMONAS AERUGINOSA    LABS  CBC Latest Ref Rng & Units 01/25/2020 01/24/2020 01/23/2020  WBC 4.0 - 10.5 K/uL 14.8(H) 15.2(H) 17.5(H)  Hemoglobin 13.0 - 17.0 g/dL 13.8 14.4 14.7  Hematocrit 39 - 52 % 46.0 48.7 49.9  Platelets 150 - 400 K/uL 931(HH) 917(HH) 968(HH)   BMP Latest Ref Rng & Units 01/24/2020 01/23/2020 01/22/2020  Glucose 70 - 99 mg/dL 179(H) 203(H) 214(H)  BUN 8 - 23 mg/dL 25(H) 27(H) 28(H)  Creatinine 0.61 - 1.24 mg/dL 0.74 0.82 0.88  Sodium 135 - 145 mmol/L 139 138 138  Potassium 3.5 -  5.1 mmol/L 4.0 4.2 4.3  Chloride 98 - 111 mmol/L 107 106 104  CO2 22 - 32 mmol/L 26 21(L) 23  Calcium 8.9 - 10.3 mg/dL 8.6(L) 8.3(L) 8.4(L)    Intake/Output Summary (Last 24 hours) at 01/25/2020 1658 Last data filed at 01/25/2020 1400 Gross per 24 hour  Intake 1704.22 ml  Output 1650 ml  Net 54.22 ml    IMAGING    No results found.   Nutrition Status: Nutrition Problem: Moderate Malnutrition Etiology: chronic illness (CHF) Signs/Symptoms: moderate fat depletion, moderate muscle depletion Interventions: Tube feeding, MVI    Indwelling Urinary Catheter continued, requirement due to   Reason to continue Indwelling Urinary Catheter strict Intake/Output monitoring for hemodynamic instability   Central Line/ continued, requirement due to  Reason to continue [employer] Monitoring of central venous pressure or other hemodynamic parameters and poor IV access   Ventilator continued, requirement due to severe respiratory failure   Ventilator Sedation RASS 0 to -2      ASSESSMENT AND PLAN SYNOPSIS [name] with severe respiratory  failure due to metabolic acidosis with severe systolic CHF exacerbation, previous history of COVID-19 in the setting of drug abuse, undergoing therapy for HCAP, complicated by tachyarrhythmia and progressive heart failure. Patient with recurrent respiratory failure requiring intubation.  Severe ACUTE Hypoxic and Hypercapnic Respiratory Failure Continue BIPAP -monitor secretions - Palliative care following -MRSA and Pseudomonas pneumonia - c/w zyvox and cefepime  ACUTE SYSTOLIC CARDIAC FAILURE - EF 20-25% likely related to ETOH and drug abuse need to assess for ischemia - Oxygen as needed - Lasix as tolerated - Follow up cardiac enzymes as indicated - Follow up cardiology recs - Second opinion from [facility] --> recommending repeat echo and cardiac catheterization - Right and Left Heart Cath scheduled 11/19 with Dr. Fletcher Anon, discontinue Eliquis, initiate IV heparin interim  SHOCK-CARDIOGENIC due to tachyarrhythmias - S/p electrical cardioversion - Use vasopressors to keep MAP > 65 - IV amiodarone discontinued due to torsades --> reassessed by EP 11/16, no past evidence of torsades - Transition from IV lidocaine back to IV amiodarone per EP Dr. Quentin Ore - Plan to wean off lidocaine after cardiac catheterization - Metoprolol held due to low blood pressure - Per cardiology: maintain potassium > 4.0 and magnesium > 2.0  ACUTE KIDNEY INJURY/Renal Failure - Continue Foley Catheter-assess need - Avoid nephrotoxic agents - Follow urine output, BMP - Ensure adequate renal perfusion, optimize oxygenation - Renal dose medications  NEUROLOGY - Acute toxic metabolic encephalopathy, need for sedation - Goal RASS -2 to -3  CARDIAC - ICU monitoring - Telemetry showing sinus rhythm with PVCs  ID  - Tracheal culture 11/14 positive for MRSA and pseudomonas - Continue IV abx as prescribed  GI - GI PROPHYLAXIS as indicated - DIET --> TF's as tolerated - Constipation protocol as indicated  ENDO -  Will use ICU hypoglycemic\Hyperglycemia protocol if indicated  ELECTROLYTES - Follow labs as needed - Replace as needed - Pharmacy consultation and following   DVT/GI PRX ordered and assessed TRANSFUSIONS AS NEEDED MONITOR FSBS I Assessed the need for Labs I Assessed the need for Foley I Assessed the need for Central Venous Line Family Discussion when available I Assessed the need for Mobilization I made an Assessment of medications to be adjusted accordingly Safety Risk assessment completed   CASE DISCUSSED IN MULTIDISCIPLINARY ROUNDS WITH ICU TEAM  Critical Care Time devoted to patient care services described in this note is 33 minutes.   Overall, patient is critically ill,  prognosis is guarded.  Patient with Multiorgan failure and at high risk for cardiac arrest and death.   Follow up [facility] recs, plan for cath this week   Ottie Glazier, M.D.  Pulmonary & [place]

## 2020-01-26 ENCOUNTER — Inpatient Hospital Stay: Payer: Medicare HMO

## 2020-01-26 DIAGNOSIS — I472 Ventricular tachycardia: Secondary | ICD-10-CM | POA: Diagnosis not present

## 2020-01-26 DIAGNOSIS — I5023 Acute on chronic systolic (congestive) heart failure: Secondary | ICD-10-CM | POA: Diagnosis not present

## 2020-01-26 LAB — CBC WITH DIFFERENTIAL/PLATELET
Abs Immature Granulocytes: 0.29 10*3/uL — ABNORMAL HIGH (ref 0.00–0.07)
Basophils Absolute: 0.2 10*3/uL — ABNORMAL HIGH (ref 0.0–0.1)
Basophils Relative: 1 %
Eosinophils Absolute: 0.5 10*3/uL (ref 0.0–0.5)
Eosinophils Relative: 3 %
HCT: 44.4 % (ref 39.0–52.0)
Hemoglobin: 13.9 g/dL (ref 13.0–17.0)
Immature Granulocytes: 2 %
Lymphocytes Relative: 12 %
Lymphs Abs: 2 10*3/uL (ref 0.7–4.0)
MCH: 21 pg — ABNORMAL LOW (ref 26.0–34.0)
MCHC: 31.3 g/dL (ref 30.0–36.0)
MCV: 67 fL — ABNORMAL LOW (ref 80.0–100.0)
Monocytes Absolute: 1.6 10*3/uL — ABNORMAL HIGH (ref 0.1–1.0)
Monocytes Relative: 9 %
Neutro Abs: 12.5 10*3/uL — ABNORMAL HIGH (ref 1.7–7.7)
Neutrophils Relative %: 73 %
Platelets: 953 10*3/uL (ref 150–400)
RBC: 6.63 MIL/uL — ABNORMAL HIGH (ref 4.22–5.81)
RDW: 26.5 % — ABNORMAL HIGH (ref 11.5–15.5)
WBC: 17.1 10*3/uL — ABNORMAL HIGH (ref 4.0–10.5)
nRBC: 0 % (ref 0.0–0.2)

## 2020-01-26 LAB — BASIC METABOLIC PANEL
Anion gap: 8 (ref 5–15)
BUN: 21 mg/dL (ref 8–23)
CO2: 23 mmol/L (ref 22–32)
Calcium: 8.8 mg/dL — ABNORMAL LOW (ref 8.9–10.3)
Chloride: 110 mmol/L (ref 98–111)
Creatinine, Ser: 0.73 mg/dL (ref 0.61–1.24)
GFR, Estimated: >60 mL/min (ref >60)
Glucose, Bld: 115 mg/dL — ABNORMAL HIGH (ref 70–99)
Potassium: 3.1 mmol/L — ABNORMAL LOW (ref 3.5–5.1)
Sodium: 141 mmol/L (ref 135–145)

## 2020-01-26 LAB — APTT: aPTT: 76 seconds — ABNORMAL HIGH (ref 24–36)

## 2020-01-26 LAB — GLUCOSE, CAPILLARY
Glucose-Capillary: 110 mg/dL — ABNORMAL HIGH (ref 70–99)
Glucose-Capillary: 117 mg/dL — ABNORMAL HIGH (ref 70–99)
Glucose-Capillary: 118 mg/dL — ABNORMAL HIGH (ref 70–99)
Glucose-Capillary: 125 mg/dL — ABNORMAL HIGH (ref 70–99)
Glucose-Capillary: 131 mg/dL — ABNORMAL HIGH (ref 70–99)
Glucose-Capillary: 150 mg/dL — ABNORMAL HIGH (ref 70–99)
Glucose-Capillary: 162 mg/dL — ABNORMAL HIGH (ref 70–99)

## 2020-01-26 LAB — PHOSPHORUS: Phosphorus: 1.2 mg/dL — ABNORMAL LOW (ref 2.5–4.6)

## 2020-01-26 LAB — HEPARIN LEVEL (UNFRACTIONATED): Heparin Unfractionated: 0.69 IU/mL (ref 0.30–0.70)

## 2020-01-26 LAB — TRIGLYCERIDES: Triglycerides: 77 mg/dL (ref <150)

## 2020-01-26 LAB — MAGNESIUM: Magnesium: 1.8 mg/dL (ref 1.7–2.4)

## 2020-01-26 MED ORDER — POTASSIUM CHLORIDE 10 MEQ/100ML IV SOLN
10.0000 meq | INTRAVENOUS | Status: AC
  Administered 2020-01-26 (×4): 10 meq via INTRAVENOUS
  Filled 2020-01-26 (×4): qty 100

## 2020-01-26 MED ORDER — K PHOS MONO-SOD PHOS DI & MONO 155-852-130 MG PO TABS
500.0000 mg | ORAL_TABLET | ORAL | Status: DC
  Filled 2020-01-26 (×2): qty 2

## 2020-01-26 MED ORDER — POTASSIUM PHOSPHATES 15 MMOLE/5ML IV SOLN
30.0000 mmol | Freq: Once | INTRAVENOUS | Status: AC
  Administered 2020-01-26: 30 mmol via INTRAVENOUS
  Filled 2020-01-26: qty 10

## 2020-01-26 MED ORDER — DEXMEDETOMIDINE HCL IN NACL 400 MCG/100ML IV SOLN
0.4000 ug/kg/h | INTRAVENOUS | Status: DC
  Administered 2020-01-26: 0.3 ug/kg/h via INTRAVENOUS
  Administered 2020-01-26: 0.4 ug/kg/h via INTRAVENOUS
  Filled 2020-01-26 (×2): qty 100

## 2020-01-26 MED ORDER — MAGNESIUM SULFATE 2 GM/50ML IV SOLN
2.0000 g | Freq: Once | INTRAVENOUS | Status: AC
  Administered 2020-01-26: 2 g via INTRAVENOUS
  Filled 2020-01-26: qty 50

## 2020-01-26 MED ORDER — POTASSIUM CHLORIDE 20 MEQ PO PACK: 40.0000 meq | PACK | Freq: Once | ORAL | Status: DC

## 2020-01-26 MED ORDER — POTASSIUM CHLORIDE 10 MEQ/50ML IV SOLN
10.0000 meq | INTRAVENOUS | Status: AC
  Administered 2020-01-26 (×4): 10 meq via INTRAVENOUS
  Filled 2020-01-26 (×4): qty 50

## 2020-01-26 NOTE — Progress Notes (Signed)
CRITICAL CARE NOTE  [name] presenting to the ED on 01/06/2020 in respiratory distress, complaining of chest pain and shortness of breath. He also reported orthopnea and increased lower extremity swelling. History was limited due to acuity of condition and respiratory status. Patient was placed on biPAP, but progressively worsened and was emergently intubated. Patient is a current 0.5ppd smoker and uses marijuana. He has a severe metabolic acidosis. Patient has a past medical history significant for HTN, T2DM, HLD, GERD, CAD (s/p PCI), AVR with bioprosthetic valve, systolic CHF with EF of 70%, and paroxysmal A. Fib, currently anticoagulated on Eliquis. EKG upon arrival to ED revealed sinus rhythm at a rate of 99bpm with RBBB and PVCs with ST elevation in II, III, avF, V4-V6. Cardiologist on call stated that STEMI criteria were not met and advised to manage CHF. Patient received IV diltiazem and reported improvement of symptoms. Labs were notable for high sensitivity troponin 71->91->89. BNP 1046, creatinine 1.26, BUN 21, potassium 5.4. CXR showed improved pulmonary edema since prior study with chronic pulmonary venous hypertension. Notably, patient was admitted to [facility] ED 12/04/2019 for acute respiratory failure and acute on chronic systolic CHF in the setting of COVID pneumonia.   SIGNIFICANT EVENTS  11/02 - severe cardiogenic shock, remains on vent 11/03 - severe respiratory failure, severe systolic CHF 26/37 - patient remains critically ill 11/05 - patient remains critically ill 11/06 - off pressors, extubated to biPAP 11/07 - remains extubated, febrile, MSSA+ tracheal culture - started ancef 11/08 - remains extubated, on 2L by nasal cannula. Afebrile today with white count of 21.3K, up from 15.7K yesterday. Still encephalopathic and drowsy. On and off Precedex. Went into narrow complex SVT,then progressed to wide complex tachycardiawas shocked and given a loading dose of amiodarone. Will  remain on amiodarone gtt.Cardiology at bedside.  11/09 - remains extubated, on 2L by nasal cannula. Afebrile today with white count of 19.2K, down since yesterday. BP soft overnight, was started on Neo-synephrine infusion. More alert than yesterday, but only oriented to self. Failed Yale 3oz swallow study early this morning, started coughing at the end of the study. Will need to remain NPO until cleared by SLP. Patient has been educated on this, yet continues to incessantly ask for water.  11/10 - patient clinically improved. He is alert communicative and in no distress. He ate entire meal without issues. Still has confusion intermittently during interview. Frequent PVCs on telemetry.  Will initiate OT/PT today for downgrade planning. 11/11 - patient minimally symptomatic, he again had episodes of Torsades today s/p 120Jx1 mag 2g.  Poor prognosis overall with advanced CHF.  11/12 - Dicussed case with cardiology. Antiarrythmic agents being modified. Will also call consult for Electrophysiology  11/13 - Patient with no arrythmia overnight. He remains on precedex and neosynephrine.  I spoke with cardiology Dr Rayann Heman regarding EP evaluation- he recommended to continue with current medical therapy and have cardiologist call to EP on Monday to review options.  He has 1:1 sitter and is resting in bed comfortably. Spoke with Velva Harman POA daughter to update on poor prognosis worsening clinical condition.  11/14 - further deterioration overnight.  Patient required reintubation and MV.  Updated next of kin daughter Velva Harman this am and sent message to cardiology to discuss poor prognosis with family.    11/15 - remains intubated and sedated, high risk for cardiac arrest and death February 18, 2023 - remains intubated and sedated, frequent PVCs overnight, 2g magnesium given, awaiting EP consult, patient becomes tachypneic and diaphoretic during  SBTs 11/17 - increased ectopy, seen by EP who advised weaning lidocaine and restarting  amiodarone, planning for right & left heart cath on Friday 11/19 with Dr. Fletcher Anon, discontinue Eliquis, start IV heparin interim, tracheal culture 11/14 positive for MRSA and pseudomonas, started cefepime and linezolid 11/18 - remains intubated and sedated, on continuous infusion of amiodarone and lidocaine, metoprolol discontinued by cardiology due to low blood pressures, right/left heart cath scheduled for tomorrow 11/19- pt s/p L&RHC no obstuctive lesions, RHC with normal pressures. Patient will be treated medically with transition to PO amio and plan for SBT with attempt to liberate.  11/20- patient was liberated from ventilator.  He had persistent VT post extubation , was fortunate to have cardiologist present on time and had immediate medical intervention with improvement. Still very poor prognosis long term. Spoke to granddaugther at bedside requested family meeting to re-address goals of care due to limited therapy available for patietn.  11/21- Patiient was able to stay off ventilator last night. He was started on Precedex overnight and is currently sedated to RASS-1 on nasal canula, plan to wean down/off sedation.   CC  Follow up respiratory failure  SUBJECTIVE Patient remains critically ill Prognosis is guarded Remains on pressors   BP 139/73   Pulse 71   Temp 99.7 F (37.6 C) (Oral)   Resp 17   Ht 5' 7.01" (1.702 m)   Wt 72 kg   SpO2 99%   BMI 24.85 kg/m    I/O last 3 completed shifts: In: 2685.2 [I.V.:1685.1; IV Piggyback:1000.1] Out: 3025 [Urine:3025] Total I/O In: -  Out: 485 [Urine:485]  SpO2: 99 % O2 Flow Rate (L/min): 4 L/min FiO2 (%): 28 %  Estimated body mass index is 24.85 kg/m as calculated from the following:   Height as of this encounter: 5' 7.01" (1.702 m).   Weight as of this encounter: 72 kg.  SIGNIFICANT EVENTS   REVIEW OF SYSTEMS  PATIENT IS UNABLE TO PROVIDE COMPLETE REVIEW OF SYSTEMS DUE TO SEVERE CRITICAL ILLNESS   Pressure Injury  01/20/20 Sacrum Posterior;Medial Stage 2 -  Partial thickness loss of dermis presenting as a shallow open injury with a red, pink wound bed without slough. (Active)  01/20/20 0800  Location: Sacrum  Location Orientation: Posterior;Medial  Staging: Stage 2 -  Partial thickness loss of dermis presenting as a shallow open injury with a red, pink wound bed without slough.  Wound Description (Comments):   Present on Admission: No    PHYSICAL EXAMINATION:  GENERAL: Chronically ill NAD HEAD: Normocephalic, atraumatic.  EYES: Pupils equal, round, reactive to light.  No scleral icterus.  MOUTH: Moist mucosal membrane. NECK: Supple.  PULMONARY: CTAB CARDIOVASCULAR: S1 and S2. Regular rate and rhythm. No murmurs, rubs, or gallops.  GASTROINTESTINAL: Soft, nontender, non-distended.  Positive bowel sounds.   MUSCULOSKELETAL: No swelling, clubbing, or edema.  NEUROLOGIC: confused GCS<10 SKIN: Intact, warm, dry  MEDICATIONS: I have reviewed all medications and confirmed regimen as documented Scheduled Meds: . allopurinol  300 mg Per Tube Daily  . aspirin  81 mg Per Tube Daily  . atorvastatin  40 mg Per Tube QHS  . carvedilol  3.125 mg Oral BID WC  . chlorhexidine gluconate (MEDLINE KIT)  15 mL Mouth Rinse BID  . Chlorhexidine Gluconate Cloth  6 each Topical Daily  . docusate  100 mg Per Tube BID  . feeding supplement (PROSource TF)  45 mL Per Tube Daily  . fentaNYL (SUBLIMAZE) injection  25 mcg Intravenous Once  .  furosemide  20 mg Intravenous Daily  . insulin aspart  0-9 Units Subcutaneous Q4H  . linezolid  600 mg Per Tube Q12H  . mouth rinse  15 mL Mouth Rinse 10 times per day  . polyethylene glycol  17 g Per Tube Daily  . potassium chloride  40 mEq Per Tube Once  . scopolamine  1 patch Transdermal Q72H  . senna-docusate  1 tablet Per Tube BID  . sodium chloride flush  10-40 mL Intracatheter Q12H  . sodium chloride flush  3 mL Intravenous Q12H   Continuous Infusions: . sodium  chloride Stopped (01/26/20 0204)  . amiodarone 30 mg/hr (01/26/20 0935)  . ceFEPime (MAXIPIME) IV Stopped (01/26/20 0549)  . dexmedetomidine (PRECEDEX) IV infusion 0.6 mcg/kg/hr (01/26/20 0600)  . famotidine (PEPCID) IV 20 mg (01/26/20 0946)  . feeding supplement (VITAL AF 1.2 CAL) 1,000 mL (01/24/20 1651)  . fentaNYL infusion INTRAVENOUS Stopped (01/25/20 0955)  . heparin 1,100 Units/hr (01/26/20 0600)  . magnesium sulfate bolus IVPB    . midazolam Stopped (01/25/20 0955)  . phenylephrine (NEO-SYNEPHRINE) Adult infusion Stopped (01/24/20 0319)  . potassium chloride 10 mEq (01/26/20 0905)  . potassium PHOSPHATE IVPB (in mmol)     PRN Meds:.sodium chloride, acetaminophen, albuterol, diazepam, fentaNYL, haloperidol lactate, metoprolol tartrate, ondansetron (ZOFRAN) IV, oxyCODONE, polyethylene glycol   CULTURE RESULTS   Recent Results (from the past 240 hour(s))  Culture, respiratory     Status: None   Collection Time: 01/19/20  3:55 PM   Specimen: Tracheal Aspirate; Respiratory  Result Value Ref Range Status   Specimen Description   Final    TRACHEAL ASPIRATE Performed at [facility], [address] [place], [place] [address]    Special Requests   Final    NONE Performed at [facility], [address] [place], [place] [address]    Gram Stain   Final    MODERATE WBC PRESENT, PREDOMINANTLY PMN ABUNDANT GRAM POSITIVE COCCI IN PAIRS IN CLUSTERS ABUNDANT GRAM NEGATIVE RODS Performed at [facility], [place] [address], [place],  [address]    Culture   Final    ABUNDANT METHICILLIN RESISTANT STAPHYLOCOCCUS AUREUS MODERATE PSEUDOMONAS AERUGINOSA    Report Status 01/22/2020 FINAL  Final   Organism ID, Bacteria METHICILLIN RESISTANT STAPHYLOCOCCUS AUREUS  Final   Organism ID, Bacteria PSEUDOMONAS AERUGINOSA  Final      Susceptibility   Methicillin resistant staphylococcus aureus - MIC*    CIPROFLOXACIN <=0.5 SENSITIVE Sensitive     ERYTHROMYCIN  >=8 RESISTANT Resistant     GENTAMICIN <=0.5 SENSITIVE Sensitive     OXACILLIN >=4 RESISTANT Resistant     TETRACYCLINE 2 SENSITIVE Sensitive     VANCOMYCIN 1 SENSITIVE Sensitive     TRIMETH/SULFA <=10 SENSITIVE Sensitive     CLINDAMYCIN 1 INTERMEDIATE Intermediate     RIFAMPIN 1 SENSITIVE Sensitive     Inducible Clindamycin NEGATIVE Sensitive     * ABUNDANT METHICILLIN RESISTANT STAPHYLOCOCCUS AUREUS   Pseudomonas aeruginosa - MIC*    CEFTAZIDIME 2 SENSITIVE Sensitive     CIPROFLOXACIN <=0.25 SENSITIVE Sensitive     GENTAMICIN <=1 SENSITIVE Sensitive     IMIPENEM 1 SENSITIVE Sensitive     PIP/TAZO <=4 SENSITIVE Sensitive     CEFEPIME 2 SENSITIVE Sensitive     * MODERATE PSEUDOMONAS AERUGINOSA    LABS  CBC Latest Ref Rng & Units 01/26/2020 01/25/2020 01/24/2020  WBC 4.0 - 10.5 K/uL 17.1(H) 14.8(H) 15.2(H)  Hemoglobin 13.0 - 17.0 g/dL 13.9 13.8 14.4  Hematocrit 39 - 52 % 44.4 46.0 48.7  Platelets 150 - 400 K/uL 953(HH) 931(HH) 917(HH)   BMP Latest Ref Rng & Units 01/26/2020 01/25/2020 01/24/2020  Glucose 70 - 99 mg/dL 115(H) 119(H) 179(H)  BUN 8 - 23 mg/dL 21 27(H) 25(H)  Creatinine 0.61 - 1.24 mg/dL 0.73 0.83 0.74  Sodium 135 - 145 mmol/L 141 141 139  Potassium 3.5 - 5.1 mmol/L 3.1(L) 3.1(L) 4.0  Chloride 98 - 111 mmol/L 110 105 107  CO2 22 - 32 mmol/L $RemoveB'23 25 26  'PXKAKdIt$ Calcium 8.9 - 10.3 mg/dL 8.8(L) 8.9 8.6(L)    Intake/Output Summary (Last 24 hours) at 01/26/2020 1054 Last data filed at 01/26/2020 0950 Gross per 24 hour  Intake 1791.94 ml  Output 3160 ml  Net -1368.06 ml    IMAGING    No results found.   Nutrition Status: Nutrition Problem: Moderate Malnutrition Etiology: chronic illness (CHF) Signs/Symptoms: moderate fat depletion, moderate muscle depletion Interventions: Tube feeding, MVI    Indwelling Urinary Catheter continued, requirement due to   Reason to continue Indwelling Urinary Catheter strict Intake/Output monitoring for hemodynamic instability    Central Line/ continued, requirement due to  Reason to continue [employer] Monitoring of central venous pressure or other hemodynamic parameters and poor IV access   Ventilator continued, requirement due to severe respiratory failure   Ventilator Sedation RASS 0 to -2      ASSESSMENT AND PLAN SYNOPSIS [name] with severe respiratory failure due to metabolic acidosis with severe systolic CHF exacerbation, previous history of COVID-19 in the setting of drug abuse, undergoing therapy for HCAP, complicated by tachyarrhythmia and progressive heart failure. Patient with recurrent respiratory failure requiring intubation.  Severe ACUTE Hypoxic and Hypercapnic Respiratory Failure Continue BIPAP -monitor secretions - Palliative care following -MRSA and Pseudomonas pneumonia - c/w zyvox and cefepime  ACUTE SYSTOLIC CARDIAC FAILURE - EF 20-25% likely related to ETOH and drug abuse need to assess for ischemia - Oxygen as needed - Lasix as tolerated - Follow up cardiac enzymes as indicated - Follow up cardiology recs - Second opinion from [facility] --> recommending repeat echo and cardiac catheterization - Right and Left Heart Cath scheduled 11/19 with Dr. Fletcher Anon, discontinue Eliquis, initiate IV heparin interim  SHOCK-CARDIOGENIC due to tachyarrhythmias - S/p electrical cardioversion - Use vasopressors to keep MAP > 65 - IV amiodarone discontinued due to torsades --> reassessed by EP 11/16, no past evidence of torsades - Transition from IV lidocaine back to IV amiodarone per EP Dr. Quentin Ore - Plan to wean off lidocaine after cardiac catheterization - Metoprolol held due to low blood pressure - Per cardiology: maintain potassium > 4.0 and magnesium > 2.0  ACUTE KIDNEY INJURY/Renal Failure - Continue Foley Catheter-assess need - Avoid nephrotoxic agents - Follow urine output, BMP - Ensure adequate renal perfusion, optimize oxygenation - Renal dose medications  NEUROLOGY - Acute toxic  metabolic encephalopathy, need for sedation - Goal RASS -2 to -3  CARDIAC - ICU monitoring - Telemetry showing sinus rhythm with PVCs  ID  - Tracheal culture 11/14 positive for MRSA and pseudomonas - Continue IV abx as prescribed  GI - GI PROPHYLAXIS as indicated - DIET --> TF's as tolerated - Constipation protocol as indicated  ENDO - Will use ICU hypoglycemic\Hyperglycemia protocol if indicated  ELECTROLYTES - Follow labs as needed - Replace as needed - Pharmacy consultation and following   DVT/GI PRX ordered and assessed TRANSFUSIONS AS NEEDED MONITOR FSBS I Assessed the need for Labs I Assessed the  need for Foley I Assessed the need for Central Venous Line Family Discussion when available I Assessed the need for Mobilization I made an Assessment of medications to be adjusted accordingly Safety Risk assessment completed   CASE DISCUSSED IN MULTIDISCIPLINARY ROUNDS WITH ICU TEAM  Critical Care Time devoted to patient care services described in this note is 33 minutes.   Overall, patient is critically ill, prognosis is guarded.  Patient with Multiorgan failure and at high risk for cardiac arrest and death.   Follow up [facility] recs, plan for cath this week   Ottie Glazier, M.D.  [facility] [place]

## 2020-01-26 NOTE — Progress Notes (Addendum)
Progress Note  Patient Name: Travis Maxwell Date of Encounter: 01/26/2020  [facility] Cardiologist: Kathlyn Sacramento, MD   Subjective   Denies pain.  Answering yes or no questions.  Inpatient Medications    Scheduled Meds: . allopurinol  300 mg Per Tube Daily  . aspirin  81 mg Per Tube Daily  . atorvastatin  40 mg Per Tube QHS  . carvedilol  3.125 mg Oral BID WC  . chlorhexidine gluconate (MEDLINE KIT)  15 mL Mouth Rinse BID  . Chlorhexidine Gluconate Cloth  6 each Topical Daily  . docusate  100 mg Per Tube BID  . feeding supplement (PROSource TF)  45 mL Per Tube Daily  . fentaNYL (SUBLIMAZE) injection  25 mcg Intravenous Once  . furosemide  20 mg Intravenous Daily  . insulin aspart  0-9 Units Subcutaneous Q4H  . linezolid  600 mg Per Tube Q12H  . mouth rinse  15 mL Mouth Rinse 10 times per day  . polyethylene glycol  17 g Per Tube Daily  . potassium chloride  40 mEq Per Tube Once  . scopolamine  1 patch Transdermal Q72H  . senna-docusate  1 tablet Per Tube BID  . sodium chloride flush  10-40 mL Intracatheter Q12H  . sodium chloride flush  3 mL Intravenous Q12H   Continuous Infusions: . sodium chloride Stopped (01/26/20 0204)  . amiodarone 30 mg/hr (01/26/20 0935)  . ceFEPime (MAXIPIME) IV Stopped (01/26/20 0549)  . dexmedetomidine (PRECEDEX) IV infusion 0.6 mcg/kg/hr (01/26/20 0600)  . famotidine (PEPCID) IV 20 mg (01/26/20 0946)  . feeding supplement (VITAL AF 1.2 CAL) 1,000 mL (01/24/20 1651)  . fentaNYL infusion INTRAVENOUS Stopped (01/25/20 0955)  . heparin 1,100 Units/hr (01/26/20 0600)  . midazolam Stopped (01/25/20 0955)  . phenylephrine (NEO-SYNEPHRINE) Adult infusion Stopped (01/24/20 0319)  . potassium chloride 10 mEq (01/26/20 0905)  . potassium PHOSPHATE IVPB (in mmol)     PRN Meds: sodium chloride, acetaminophen, albuterol, diazepam, fentaNYL, haloperidol lactate, metoprolol tartrate, ondansetron (ZOFRAN) IV, oxyCODONE, polyethylene glycol    Vital Signs    Vitals:   01/26/20 0000 01/26/20 0500 01/26/20 0600 01/26/20 0700  BP: 117/79  139/73   Pulse: 71     Resp: (!) 23  17   Temp: 98.9 F (37.2 C)   99.7 F (37.6 C)  TempSrc: Axillary   Oral  SpO2: 99%     Weight:  72 kg    Height:        Intake/Output Summary (Last 24 hours) at 01/26/2020 1008 Last data filed at 01/26/2020 0950 Gross per 24 hour  Intake 1791.94 ml  Output 3160 ml  Net -1368.06 ml   Last 3 Weights 01/26/2020 01/25/2020 01/24/2020  Weight (lbs) 158 lb 11.7 oz 158 lb 11.7 oz 158 lb 8.2 oz  Weight (kg) 72 kg 72 kg 71.9 kg      Telemetry    Sinus rhythm.  Frequent PVCs and ventricular bigeminy.  Sustained ventricular tachycardia on 11/20 around 12:50 PM.- Personally Reviewed  ECG    01/25/2020: Sinus rhythm.  Rate 67 bpm.  Frequent PVCs.  ECG.  Left axis deviation.  LVH.  Prior inferior infarct.  Cannot rule out posterior MI. - Personally Reviewed  Physical Exam   VS:  BP 139/73   Pulse 71   Temp 99.7 F (37.6 C) (Oral)   Resp 17   Ht 5' 7.01" (1.702 m)   Wt 72 kg   SpO2 99%   BMI 24.85 kg/m  , BMI  Body mass index is 24.85 kg/m. GENERAL:  Ill-appearing HEENT: Pupils equal round and reactive, fundi not visualized, oral mucosa unremarkable NECK:  No jugular venous distention, waveform within normal limits, carotid upstroke brisk and symmetric, no bruits LUNGS:  CTAB HEART:  RRR.  PMI not displaced or sustained,S1 and S2 within normal limits, no S3, no S4, no clicks, no rubs,  murmurs ABD:  Flat, positive bowel sounds normal in frequency in pitch, no bruits, no rebound, no guarding, no midline pulsatile mass, no hepatomegaly, no splenomegaly EXT:  2 plus pulses throughout, no edema, no cyanosis no clubbing SKIN:  No rashes no nodules NEURO:  Cranial nerves II through XII grossly intact, motor grossly intact throughout PSYCH:  Unable to assess.  Patient sedated   Labs    High Sensitivity Troponin:   Recent Labs  Lab  01/09/20 0152 01/16/20 0245 01/16/20 0702 01/16/20 0838 01/19/20 0433  TROPONINIHS 89* 934* 711* 674* 234*      Chemistry Recent Labs  Lab 01/24/20 0425 01/25/20 1650 01/26/20 0509  NA 139 141 141  K 4.0 3.1* 3.1*  CL 107 105 110  CO2 _0 GLUCOSE 179* 119* 115*  BUN 25* 27* 21  CREATININE 0.74 0.83 0.73  CALCIUM 8.6* 8.9 8.8*  PROT  --  7.1  --   ALBUMIN  --  2.5*  --   AST  --  40  --   ALT  --  19  --   ALKPHOS  --  97  --   BILITOT  --  0.8  --   GFRNONAA >60 >60 >60  ANIONGAP _1 Hematology Recent Labs  Lab 01/24/20 0425 01/25/20 0415 01/26/20 0509  WBC 15.2* 14.8* 17.1*  RBC 7.01* 6.67* 6.63*  HGB 14.4 13.8 13.9  HCT 48.7 46.0 44.4  MCV 69.5* 69.0* 67.0*  MCH 20.5* 20.7* 21.0*  MCHC 29.6* 30.0 31.3  RDW 26.8* 27.1* 26.5*  PLT 917* 931* 953*    BNPNo results for input(s): BNP, PROBNP in the last 168 hours.   DDimer No results for input(s): DDIMER in the last 168 hours.   Radiology    CARDIAC CATHETERIZATION  Result Date: 01/24/2020  Previously placed Prox RCA to Mid RCA stent (unknown type) is widely patent.  Prox RCA lesion is 50% stenosed.  1.  Patent stent in a nondominant right coronary artery.  No significant coronary artery disease overall. 2.  The replaced aortic valve was not crossed. 3.  Right heart catheterization showed mildly elevated filling pressures, minimal pulmonary hypertension and normal cardiac output. Recommendations: The patient has no ischemic substrate for ventricular tachycardia.  He has nonischemic cardiomyopathy. His hemodynamics appear good overall.  Recommend continuing same dose of IV furosemide 20 mg daily which can be switched to an oral diuretic after extubation. I am going to stop lidocaine drip and continue amiodarone drip which can subsequently be switched to oral after extubation. I elected to add small dose carvedilol to assist with his cardiomyopathy and ventricular tachycardia.   Cardiac Studies    LHC 01/24/20:  Previously placed Prox RCA to Mid RCA stent (unknown type) is widely patent.  Prox RCA lesion is 50% stenosed.   1.  Patent stent in a nondominant right coronary artery.  No significant coronary artery disease overall. 2.  The replaced aortic valve was not crossed. 3.  Right heart catheterization showed mildly elevated filling pressures, minimal pulmonary hypertension and normal cardiac output.  Recommendations: The  patient has no ischemic substrate for ventricular tachycardia.  He has nonischemic cardiomyopathy. His hemodynamics appear good overall.  Recommend continuing same dose of IV furosemide 20 mg daily which can be switched to an oral diuretic after extubation. I am going to stop lidocaine drip and continue amiodarone drip which can subsequently be switched to oral after extubation. I elected to add small dose carvedilol to assist with his cardiomyopathy and ventricular tachycardia.  Echo 01/21/20: 1. Left ventricular ejection fraction, by estimation, is 20 to 25%. The  left ventricle has severely decreased function. The left ventricle  demonstrates regional wall motion abnormalities (see scoring  diagram/findings for description). The left  ventricular internal cavity size was moderately dilated. There is mild  left ventricular hypertrophy. Left ventricular diastolic parameters are  consistent with Grade I diastolic dysfunction (impaired relaxation).  Elevated left atrial pressure. There is  severe global hypokinesis with relative sparing of the basal segments.  2. Right ventricular systolic function is moderately reduced. The right  ventricular size is mildly enlarged. Tricuspid regurgitation signal is  inadequate for assessing PA pressure.  3. Left atrial size was mildly dilated.  4. The mitral valve is grossly normal. Mild mitral valve regurgitation.  No evidence of mitral stenosis.  5. The aortic valve was not well visualized. Aortic valve  regurgitation  is not visualized. Echo findings are consistent with normal structure and  function of the aortic valve prosthesis.  6. Pulmonic valve regurgitation not well assessed.    Patient Profile     [phone] with CAD status post PCI, chronic systolic diastolic heart failure, diabetes, tobacco abuse, status post bioprosthetic aortic valve, paroxysmal atrial fibrillation, hypertension, hyperlipidemia, and THC abuse admitted with ventricular tachycardia requiring defibrillation and hypoxic respiratory failure due to MRSA and Pseudomonas pneumonia.    Assessment & Plan    # Sustained VT:  Patient had pulseless VT requiring defibrillation.  He subsequently underwent left heart cath which showed nonobstructive CAD.  Amiodarone was initially held due to concern for QT prolongation.  However per EP it was recommended to be resumed.  He was also transiently on lidocaine which has been discontinued.  Yesterday he had another episode of sustained VT.  This responded to a bolus of IV amiodarone and IV metoprolol.  Since that time he has had PVCs but no more sustained VT.  Continue IV amiodarone 10 g load.  Can convert to oral 400 mg twice daily once he is able to tolerate oral medications.  Once he is off Precedex would recommend starting IV metoprolol 5 mg every 8 hours as blood pressure tolerates.  Transition to oral metoprolol as able.  Continue to replete electrolytes to maintain potassium greater than 4 and magnesium greater than 2.  It is difficult to assess his neurologic status fully right now.  He is still on Precedex.  If he is neurologically intact he will need an ICD for secondary prevention.  # CAD s/p PCI:  Stable, non-obstructive disease with patent stents on cath.  Continue aspirin, carvedilol, and atorvastatin.   # Chronic systolic and diastolic heart failure: Net -693 mL yesterday.  BP stable.  Continue lasix 56m IV daily.  Add metoprolol succinate as he is able to tolerate orals.   #  PAF:   He is maintaining sinus rhythm.  Continue IV amiodarone and heparin as above.  Ultimately he will need to go back on Eliquis.  Transition back to Eliquis once he is able to tolerate orals.  Even if he needs  ICD, this should be able to be accomplished on Eliquis.     For questions or updates, please contact [facility] Please consult [email or web address] for contact info under        Signed, Skeet Latch, MD  01/26/2020, 10:08 AM

## 2020-01-26 NOTE — Progress Notes (Signed)
ANTICOAGULATION CONSULT NOTE   Pharmacy Consult for heparin Indication: atrial fibrillation  Allergies  Allergen Reactions  . Penicillins Other (See Comments)    Has patient had a PCN reaction causing immediate rash, facial/tongue/throat swelling, SOB or lightheadedness with hypotension: Unknown Has patient had a PCN reaction causing severe rash involving mucus membranes or skin necrosis: Unknown Has patient had a PCN reaction that required hospitalization: Unknown Has patient had a PCN reaction occurring within the last 10 years: No If all of the above answers are "NO", then may proceed with Cephalosporin use.     Patient Measurements: Height: 5' 7.01" (170.2 cm) Weight: 72 kg (158 lb 11.7 oz) IBW/kg (Calculated) : 66.12 Heparin Dosing Weight: 67 kg  Vital Signs: Temp: 98.9 F (37.2 C) (11/21 0000) Temp Source: Axillary (11/21 0000) BP: 139/73 (11/21 0600) Pulse Rate: 71 (11/21 0000)  Labs: Recent Labs    01/24/20 0425 01/25/20 0415 01/25/20 1650 01/26/20 0509  HGB 14.4 13.8  --   --   HCT 48.7 46.0  --   --   PLT 917* 931*  --   --   APTT 76* 78*  --  76*  HEPARINUNFRC 0.88* 0.79*  --  0.69  CREATININE 0.74  --  0.83 0.73    Estimated Creatinine Clearance: 83.8 mL/min (by C-G formula based on SCr of 0.73 mg/dL).   Medical History: Past Medical History:  Diagnosis Date  . CAD (coronary artery disease)   . Chronic systolic (congestive) heart failure (HCC)   . Diabetes mellitus without complication (HCC)   . GERD (gastroesophageal reflux disease)   . Gout   . Hx of aortic valve replacement    Bioprosthetic valve  . Hypertension       Assessment: 69 year old male with h/o CAD, HFrEF, aortic regurgitation and stenosis s/p bioprosthetic aortic valve, afib on Eliquis. Patient with episodes of ventricular tachycardia requiring defibrillation multiple times. Patient has been on Eliquis here. Plan is for heart cath Friday after appropriate Eliquis washout. Plan  to start heparin in interim.  11/17 0414 aPTT 65 11/17 1610 aPTT 53 11/18 0138 aPTT 151 11/18 0946 aPTT 76, HL 0.98  11/18 1617 aPTT 89 11/19 0425 aPTT 76, HL 0.88  11/20 0415 aPTT 78, HL 0.79  11/21 0509 aPTT 76, HL 0.69   Goal of Therapy:  Heparin level 0.3-0.7 units/ml aPTT 66-102 seconds Monitor platelets by anticoagulation protocol: Yes   Plan:  11/20 @ 0415:  APTT = 78,  HL = 0.79 Will continue pt on current rate and recheck aPTT and HL on 11/21 AM labs.   11/21 @ 0509:  APTT = 76, HL = 0.69 APTT and HL both within therapeutic range now.  Will use HL to guide dosing from here on. Will recheck HL on 11/22 with AM labs.   Derotha Fishbaugh D 01/26/2020 6:19 AM

## 2020-01-26 NOTE — Progress Notes (Addendum)
PHARMACY CONSULT NOTE - FOLLOW UP  Pharmacy Consult for Electrolyte Monitoring and Replacement   Recent Labs: Potassium (mmol/L)  Date Value  01/26/2020 3.1 (L)   Magnesium (mg/dL)  Date Value  59/16/3846 1.6 (L)   Calcium (mg/dL)  Date Value  65/99/3570 8.8 (L)   Albumin (g/dL)  Date Value  17/79/3903 2.5 (L)   Phosphorus (mg/dL)  Date Value  00/92/3300 1.2 (L)   Sodium (mmol/L)  Date Value  01/26/2020 141   Assessment: 69 year old male transferred to ICU for acute toxic metabolic encephalopathy. Patient now more alert and awake. Overnight 11/10, patient with two runs pulseless VT, with second episode requiring CPR and defibrillation. Converted back into baseline bundle branch rhythm with rate in 50's-60's.  Pt received magnesium 2 gm during episode. Similar episodes 11/11 requiring defibrillation. Pharmacy to manage electrolytes.  Patient intubated 11/14.   Mg 1.6 > 1.8  Goal of Therapy:  Electrolytes WNL  Plan:  Will give Mg 2 g IV x 1.  K 10 meq IV x 4 + KCl 40 mEq PO per tube x 1.  Will give KPhos IV 30 mmol x 1.  Recheck electrolytes with daily AM labs  Ronnald Ramp ,PharmD Clinical Pharmacist 01/26/2020 8:29 AM

## 2020-01-27 DIAGNOSIS — J9601 Acute respiratory failure with hypoxia: Secondary | ICD-10-CM | POA: Diagnosis not present

## 2020-01-27 DIAGNOSIS — I5023 Acute on chronic systolic (congestive) heart failure: Secondary | ICD-10-CM | POA: Diagnosis not present

## 2020-01-27 DIAGNOSIS — Z515 Encounter for palliative care: Secondary | ICD-10-CM | POA: Diagnosis not present

## 2020-01-27 DIAGNOSIS — Z7189 Other specified counseling: Secondary | ICD-10-CM | POA: Diagnosis not present

## 2020-01-27 DIAGNOSIS — I472 Ventricular tachycardia: Secondary | ICD-10-CM | POA: Diagnosis not present

## 2020-01-27 LAB — POTASSIUM: Potassium: 3.6 mmol/L (ref 3.5–5.1)

## 2020-01-27 LAB — MAGNESIUM
Magnesium: 1.8 mg/dL (ref 1.7–2.4)
Magnesium: 1.9 mg/dL (ref 1.7–2.4)

## 2020-01-27 LAB — CBC WITH DIFFERENTIAL/PLATELET
Abs Immature Granulocytes: 0.17 10*3/uL — ABNORMAL HIGH (ref 0.00–0.07)
Abs Immature Granulocytes: 0.21 10*3/uL — ABNORMAL HIGH (ref 0.00–0.07)
Basophils Absolute: 0.3 10*3/uL — ABNORMAL HIGH (ref 0.0–0.1)
Basophils Absolute: 0.3 10*3/uL — ABNORMAL HIGH (ref 0.0–0.1)
Basophils Relative: 2 %
Basophils Relative: 2 %
Eosinophils Absolute: 0.6 10*3/uL — ABNORMAL HIGH (ref 0.0–0.5)
Eosinophils Absolute: 0.6 10*3/uL — ABNORMAL HIGH (ref 0.0–0.5)
Eosinophils Relative: 4 %
Eosinophils Relative: 4 %
HCT: 46.3 % (ref 39.0–52.0)
HCT: 47.7 % (ref 39.0–52.0)
Hemoglobin: 14.3 g/dL (ref 13.0–17.0)
Hemoglobin: 14.9 g/dL (ref 13.0–17.0)
Immature Granulocytes: 1 %
Immature Granulocytes: 1 %
Lymphocytes Relative: 12 %
Lymphocytes Relative: 15 %
Lymphs Abs: 1.8 10*3/uL (ref 0.7–4.0)
Lymphs Abs: 2.3 10*3/uL (ref 0.7–4.0)
MCH: 20.7 pg — ABNORMAL LOW (ref 26.0–34.0)
MCH: 21 pg — ABNORMAL LOW (ref 26.0–34.0)
MCHC: 30.9 g/dL (ref 30.0–36.0)
MCHC: 31.2 g/dL (ref 30.0–36.0)
MCV: 67.1 fL — ABNORMAL LOW (ref 80.0–100.0)
MCV: 67.2 fL — ABNORMAL LOW (ref 80.0–100.0)
Monocytes Absolute: 1.4 10*3/uL — ABNORMAL HIGH (ref 0.1–1.0)
Monocytes Absolute: 1.6 10*3/uL — ABNORMAL HIGH (ref 0.1–1.0)
Monocytes Relative: 10 %
Monocytes Relative: 9 %
Neutro Abs: 10.8 10*3/uL — ABNORMAL HIGH (ref 1.7–7.7)
Neutro Abs: 11.1 10*3/uL — ABNORMAL HIGH (ref 1.7–7.7)
Neutrophils Relative %: 68 %
Neutrophils Relative %: 72 %
Platelets: 1005 10*3/uL (ref 150–400)
Platelets: 1018 10*3/uL (ref 150–400)
RBC: 6.9 MIL/uL — ABNORMAL HIGH (ref 4.22–5.81)
RBC: 7.1 MIL/uL — ABNORMAL HIGH (ref 4.22–5.81)
RDW: 26.5 % — ABNORMAL HIGH (ref 11.5–15.5)
RDW: 27.3 % — ABNORMAL HIGH (ref 11.5–15.5)
Smear Review: INCREASED
Smear Review: INCREASED
WBC: 15.4 10*3/uL — ABNORMAL HIGH (ref 4.0–10.5)
WBC: 15.7 10*3/uL — ABNORMAL HIGH (ref 4.0–10.5)
nRBC: 0 % (ref 0.0–0.2)
nRBC: 0 % (ref 0.0–0.2)

## 2020-01-27 LAB — EXPECTORATED SPUTUM ASSESSMENT W GRAM STAIN, RFLX TO RESP C

## 2020-01-27 LAB — BASIC METABOLIC PANEL
Anion gap: 16 — ABNORMAL HIGH (ref 5–15)
BUN: 17 mg/dL (ref 8–23)
CO2: 18 mmol/L — ABNORMAL LOW (ref 22–32)
Calcium: 9 mg/dL (ref 8.9–10.3)
Chloride: 107 mmol/L (ref 98–111)
Creatinine, Ser: 0.88 mg/dL (ref 0.61–1.24)
GFR, Estimated: >60 mL/min (ref >60)
Glucose, Bld: 168 mg/dL — ABNORMAL HIGH (ref 70–99)
Potassium: 3.8 mmol/L (ref 3.5–5.1)
Sodium: 141 mmol/L (ref 135–145)

## 2020-01-27 LAB — GLUCOSE, CAPILLARY
Glucose-Capillary: 127 mg/dL — ABNORMAL HIGH (ref 70–99)
Glucose-Capillary: 132 mg/dL — ABNORMAL HIGH (ref 70–99)
Glucose-Capillary: 135 mg/dL — ABNORMAL HIGH (ref 70–99)
Glucose-Capillary: 141 mg/dL — ABNORMAL HIGH (ref 70–99)
Glucose-Capillary: 148 mg/dL — ABNORMAL HIGH (ref 70–99)
Glucose-Capillary: 152 mg/dL — ABNORMAL HIGH (ref 70–99)

## 2020-01-27 LAB — TRIGLYCERIDES: Triglycerides: 92 mg/dL (ref <150)

## 2020-01-27 LAB — PHOSPHORUS: Phosphorus: 2.6 mg/dL (ref 2.5–4.6)

## 2020-01-27 LAB — PROCALCITONIN: Procalcitonin: 1.9 ng/mL

## 2020-01-27 LAB — APTT: aPTT: 69 seconds — ABNORMAL HIGH (ref 24–36)

## 2020-01-27 LAB — HEPARIN LEVEL (UNFRACTIONATED): Heparin Unfractionated: 0.46 IU/mL (ref 0.30–0.70)

## 2020-01-27 LAB — BRAIN NATRIURETIC PEPTIDE: B Natriuretic Peptide: 1886.4 pg/mL — ABNORMAL HIGH (ref 0.0–100.0)

## 2020-01-27 MED ORDER — ASPIRIN 81 MG PO CHEW
81.0000 mg | CHEWABLE_TABLET | Freq: Every day | ORAL | Status: DC
  Administered 2020-01-28 – 2020-02-06 (×8): 81 mg via ORAL
  Filled 2020-01-27 (×8): qty 1

## 2020-01-27 MED ORDER — DIAZEPAM 2 MG PO TABS
2.0000 mg | ORAL_TABLET | Freq: Four times a day (QID) | ORAL | Status: DC | PRN
  Administered 2020-01-28 – 2020-01-30 (×4): 2 mg via ORAL
  Filled 2020-01-27 (×4): qty 1

## 2020-01-27 MED ORDER — MAGNESIUM SULFATE 2 GM/50ML IV SOLN
2.0000 g | Freq: Once | INTRAVENOUS | Status: AC
  Administered 2020-01-27: 2 g via INTRAVENOUS
  Filled 2020-01-27: qty 50

## 2020-01-27 MED ORDER — LINEZOLID 600 MG/300ML IV SOLN
600.0000 mg | Freq: Two times a day (BID) | INTRAVENOUS | Status: AC
  Administered 2020-01-27 – 2020-01-28 (×4): 600 mg via INTRAVENOUS
  Filled 2020-01-27 (×4): qty 300

## 2020-01-27 MED ORDER — ALLOPURINOL 100 MG PO TABS
300.0000 mg | ORAL_TABLET | Freq: Every day | ORAL | Status: DC
  Administered 2020-01-28 – 2020-02-20 (×21): 300 mg via ORAL
  Filled 2020-01-27 (×6): qty 1
  Filled 2020-01-27: qty 3
  Filled 2020-01-27 (×2): qty 1
  Filled 2020-01-27 (×2): qty 3
  Filled 2020-01-27 (×11): qty 1

## 2020-01-27 MED ORDER — POLYETHYLENE GLYCOL 3350 17 G PO PACK: 17.0000 g | PACK | Freq: Every day | ORAL | Status: DC | PRN

## 2020-01-27 MED ORDER — LABETALOL HCL 5 MG/ML IV SOLN: 10.0000 mg | Freq: Once | INTRAVENOUS | Status: AC

## 2020-01-27 MED ORDER — LINEZOLID 600 MG PO TABS
600.0000 mg | ORAL_TABLET | Freq: Two times a day (BID) | ORAL | Status: DC
  Filled 2020-01-27: qty 1

## 2020-01-27 MED ORDER — ACETAMINOPHEN 325 MG PO TABS
650.0000 mg | ORAL_TABLET | ORAL | Status: DC | PRN
  Administered 2020-02-18: 650 mg via ORAL
  Filled 2020-01-27: qty 2

## 2020-01-27 MED ORDER — CHLORHEXIDINE GLUCONATE 0.12 % MT SOLN
15.0000 mL | Freq: Two times a day (BID) | OROMUCOSAL | Status: DC
  Administered 2020-01-27 – 2020-02-20 (×39): 15 mL via OROMUCOSAL
  Filled 2020-01-27 (×41): qty 15

## 2020-01-27 MED ORDER — POTASSIUM CHLORIDE 10 MEQ/50ML IV SOLN
10.0000 meq | INTRAVENOUS | Status: AC
  Administered 2020-01-27 (×4): 10 meq via INTRAVENOUS
  Filled 2020-01-27 (×4): qty 50

## 2020-01-27 MED ORDER — ATORVASTATIN CALCIUM 20 MG PO TABS
40.0000 mg | ORAL_TABLET | Freq: Every day | ORAL | Status: DC
  Administered 2020-01-27 – 2020-02-19 (×23): 40 mg via ORAL
  Filled 2020-01-27 (×23): qty 2

## 2020-01-27 MED ORDER — FUROSEMIDE 10 MG/ML IJ SOLN
40.0000 mg | Freq: Once | INTRAMUSCULAR | Status: AC
  Administered 2020-01-27: 40 mg via INTRAVENOUS
  Filled 2020-01-27: qty 4

## 2020-01-27 MED ORDER — LABETALOL HCL 5 MG/ML IV SOLN
INTRAVENOUS | Status: AC
  Administered 2020-01-27: 10 mg via INTRAVENOUS
  Filled 2020-01-27: qty 4

## 2020-01-27 MED ORDER — NITROGLYCERIN IN D5W 200-5 MCG/ML-% IV SOLN
0.0000 ug/min | INTRAVENOUS | Status: DC
  Administered 2020-01-27: 5 ug/min via INTRAVENOUS
  Filled 2020-01-27: qty 250

## 2020-01-27 MED ORDER — ORAL CARE MOUTH RINSE
15.0000 mL | Freq: Two times a day (BID) | OROMUCOSAL | Status: DC
  Administered 2020-01-27 – 2020-02-20 (×30): 15 mL via OROMUCOSAL

## 2020-01-27 MED ORDER — POLYETHYLENE GLYCOL 3350 17 G PO PACK
17.0000 g | PACK | Freq: Every day | ORAL | Status: DC
  Administered 2020-02-03 – 2020-02-20 (×11): 17 g via ORAL
  Filled 2020-01-27 (×12): qty 1

## 2020-01-27 MED ORDER — SENNOSIDES-DOCUSATE SODIUM 8.6-50 MG PO TABS
1.0000 | ORAL_TABLET | Freq: Two times a day (BID) | ORAL | Status: DC
  Administered 2020-01-27 – 2020-02-19 (×28): 1 via ORAL
  Filled 2020-01-27 (×31): qty 1

## 2020-01-27 MED ORDER — OXYCODONE HCL 5 MG PO TABS
5.0000 mg | ORAL_TABLET | Freq: Four times a day (QID) | ORAL | Status: DC | PRN
  Administered 2020-02-03 – 2020-02-04 (×2): 5 mg via ORAL
  Filled 2020-01-27 (×2): qty 1

## 2020-01-27 MED ORDER — DOCUSATE SODIUM 50 MG/5ML PO LIQD
100.0000 mg | Freq: Two times a day (BID) | ORAL | Status: DC
  Administered 2020-01-27 – 2020-02-20 (×22): 100 mg via ORAL
  Filled 2020-01-27 (×39): qty 10

## 2020-01-27 NOTE — Progress Notes (Signed)
PHARMACY CONSULT NOTE  Pharmacy Consult for Electrolyte Monitoring and Replacement   Recent Labs: Potassium (mmol/L)  Date Value  01/27/2020 3.6   Magnesium (mg/dL)  Date Value  91/47/8295 1.9   Calcium (mg/dL)  Date Value  62/13/0865 9.0   Albumin (g/dL)  Date Value  78/46/9629 2.5 (L)   Phosphorus (mg/dL)  Date Value  52/84/1324 2.6   Sodium (mmol/L)  Date Value  01/27/2020 141   Assessment: 69 year old male transferred to ICU for acute toxic metabolic encephalopathy. Patient now more alert and awake. Overnight 11/10, patient with two runs pulseless VT, with second episode requiring CPR and defibrillation. Converted back into baseline bundle branch rhythm with rate in 50's-60's.  Pt received magnesium 2 gm during episode. Similar episodes 11/11 requiring defibrillation. Pharmacy to manage electrolytes.  Patient intubated 11/14 and extubated 11/20. Patient currently on Bi-PAP. Unable to be evaluated for bedside swallow evaluation at this time. Per chart review, good UOP. Appears to be having diarrhea / frequent bowel movements.    Diuretics: IV Lasix 20 mg daily. Received IV Lasix 40 mg x 1 today.   Goal of Therapy:  K > 4 Mg > 2 All other electrolytes within normal limits  Plan:  --Potassium 3.8 this AM to 3.6 this PM despite receiving IV KCl 10 mEq x 4. Will order additional IV KCl 10 mEq x 4 --Follow-up electrolytes with AM labs  [name]  01/27/2020 5:57 PM

## 2020-01-27 NOTE — Progress Notes (Signed)
PHARMACY CONSULT NOTE - FOLLOW UP  Pharmacy Consult for Electrolyte Monitoring and Replacement   Recent Labs: Potassium (mmol/L)  Date Value  01/27/2020 3.8   Magnesium (mg/dL)  Date Value  41/74/0814 1.8   Calcium (mg/dL)  Date Value  48/18/5631 9.0   Albumin (g/dL)  Date Value  49/70/2637 2.5 (L)   Phosphorus (mg/dL)  Date Value  85/88/5027 2.6   Sodium (mmol/L)  Date Value  01/27/2020 141   Assessment: 69 year old male transferred to ICU for acute toxic metabolic encephalopathy. Patient now more alert and awake. Overnight 11/10, patient with two runs pulseless VT, with second episode requiring CPR and defibrillation. Converted back into baseline bundle branch rhythm with rate in 50's-60's.  Pt received magnesium 2 gm during episode. Similar episodes 11/11 requiring defibrillation. Pharmacy to manage electrolytes.  Patient intubated 11/14.   All have improved  K 3.1 >3.8 Ca 8.8>9.0 Phos 1.2>2.6 Mg 1.8 > 1.8  Goal of Therapy:  Electrolytes WNL  Plan:  Give Mg 2g IV x 1.  Give KCL 10 meq IV q1hr x 4  Recheck electrolytes with daily AM labs  Martyn Malay ,PharmD Clinical Pharmacist 01/27/2020 1:47 PM

## 2020-01-27 NOTE — Progress Notes (Signed)
NAME:  Travis Maxwell, MRN:  [id number], DOB:  [birth year], LOS: 21 ADMISSION DATE:  01/06/2020, CONSULTATION DATE:  01/06/2020 CHIEF COMPLAINT:   Shortness of breath  Brief History   69 year old male status post COVID-19 infection 9/21 presenting with acute hypoxic respiratory failure and chest pain failing BiPAP requiring emergent intubation and mechanical ventilation admitted to the ICU.  Past Medical History  Hypertension Aortic valve replacement Chronic systolic heart failure -EF less than 20% CAD Tobacco abuse Type 2 diabetes mellitus Proximal A. Fib COVID-19 (11/2019)  Significant Hospital Events   11/02 - severe cardiogenic shock, remains on vent 11/03 - severe respiratory failure, severe systolic CHF 11/04 - patient remains critically ill 11/05 - patient remains critically ill 11/06 - off pressors, extubated to biPAP 11/07 - remains extubated, febrile, MSSA+ tracheal culture - started ancef 11/08- remains extubated, on 2L by nasal cannula. Afebrile today with white count of 21.3K, up from 15.7K yesterday. Still encephalopathic and drowsy. On and off Precedex. Went into narrow complex SVT,then progressed to wide complex tachycardiawas shocked and given a loading dose of amiodarone. Will remain on amiodarone gtt.Cardiology at bedside.  11/09- remains extubated, on 2L by nasal cannula. Afebrile today with white count of 19.2K, down since yesterday. BP soft overnight, was started on Neo-synephrine infusion. More alert than yesterday, but only oriented to self. Failed Yale 3oz swallow study early this morning, started coughing at the end of the study. Will need to remain NPO until cleared by SLP. Patient has been educated on this, yet continues to incessantly ask for water.  11/10 - patient clinically improved. He is alert communicative and in no distress. He ate entire meal without issues. Still has confusion intermittently during interview. Frequent PVCs on telemetry. Will  initiate OT/PT today for downgrade planning. 11/11 - patient minimally symptomatic, he again had episodes of Torsades today s/p 120Jx1 mag 2g. Poor prognosis overall with advanced CHF.  11/12 -Dicussed case with cardiology. Antiarrythmic agents being modified. Will also call consult for Electrophysiology  11/13 -Patient with no arrythmia overnight. He remains on precedex and neosynephrine. I spoke with cardiology Dr Johney Frame regarding EP evaluation- he recommended to continue with current medical therapy and have cardiologist call to EP on Monday to review options. He has 1:1 sitter and is resting in bed comfortably. Spoke withRita POAdaughter to update on poor prognosis worsening clinical condition.  11/14- further deterioration overnight. Patient required reintubation and MV. Updated next of kin daughter [place] Sink this am and sent message to cardiology to discuss poor prognosis with family.  11/15 - remains intubated and sedated, high risk for cardiac arrest and death 2023/02/03 - remains intubated and sedated, frequent PVCs overnight, 2g magnesium given, awaiting EP consult, patient becomes tachypneic and diaphoretic during SBTs 11/17 - increased ectopy, seen by EP who advised weaning lidocaine and restarting amiodarone, planning for right & left heart cath on Friday 11/19 with Dr. Kirke Corin, discontinue Eliquis, start IV heparin interim, tracheal culture 11/14 positive for MRSA and pseudomonas, started cefepime and linezolid 11/18 - remains intubated and sedated, on continuous infusion of amiodarone and lidocaine, metoprolol discontinued by cardiology due to low blood pressures, right/left heart cath scheduled for tomorrow 11/19- pt s/p L&RHC no obstuctive lesions, RHC with normal pressures. Patient will be treated medically with transition to PO amio and plan for SBT with attempt to liberate.  11/20- patient was liberated from ventilator.  He had persistent VT post extubation , was fortunate to have  cardiologist present on time and had immediate  medical intervention with improvement. Still very poor prognosis long term. Spoke to granddaugther at bedside requested family meeting to re-address goals of care due to limited therapy available for patietn.  11/21- Patiient was able to stay off ventilator last night. He was started on Precedex overnight and is currently sedated to RASS-1 on nasal canula, plan to wean down/off sedation.  Consults:  Cardiology Palliative care  Procedures:  01/06/20 ETT >> 01/11/20 01/07/20 PICC >> 01/19/20 ETT >> 01/25/20 01/24/20 LHC & RHC   Significant Diagnostic Tests:  01/21/2020 Echocardiogram >> LVEF 20 to 25% with severely decreased function and severe global hypokinesis. Left ventricle moderately dilated with mild left ventricular hypertrophy.  Grade 1 diastolic dysfunction and elevated left atrial pressure.  Left atrium mildly dilated mild mitral valve regurgitation. 01/24/20 Right/Left heart cath >> right heart catheterization showed mildly elevated filling pressures, minimal pulmonary hypertension and normal cardiac output previously placed proximal RCA to mid RCA stent is widely patent.  No significant coronary artery disease overall.  Micro Data:  01/06/20 MRSA PCR>> negative 01/06/20 blood cultures x2 >> negative 01/09/20 tracheal aspirate >> Staph aureus 01/19/20 tracheal aspirate >> MRSA & Pseudomonas aeruginosa 01/26/20 tracheal aspirate >>  Antimicrobials:  01/21/20 cefepime >> 01/22/20 Linezolid >>  Interim history/subjective:  Patient sitting up in bed, no obvious distress on BiPAP.  Patient has no complaints of pain or nausea, challenging to discuss with BiPAP in place. No acute events overnight. We will attempt to transition to nasal cannula for bedside swallow and administration of cardiac meds.  Labs/ Imaging personally reviewed Net: + 700 mL (+ 5.8 L since admit) Na+/ K+: 141/ 3.8 -replacement ordered BUN/Cr.: 17/ 0.88 Hgb:  14.9  WBC/ TMAX: 15.4/afebrile PCT: 1.90  CXR 01/26/20: Cardiomegaly with persistent mild vascular congestion, small bilateral pleural effusions  Objective   Blood pressure (!) 148/95, pulse 87, temperature 99.7 F (37.6 C), temperature source Axillary, resp. rate (!) 31, height 5' 7.01" (1.702 m), weight 75.5 kg, SpO2 98 %.    FiO2 (%):  [65 %] 65 %   Intake/Output Summary (Last 24 hours) at 01/27/2020 0857 Last data filed at 01/27/2020 0600 Gross per 24 hour  Intake 2231.27 ml  Output 1485 ml  Net 746.27 ml   Filed Weights   01/25/20 0437 01/26/20 0500 01/27/20 0500  Weight: 72 kg 72 kg 75.5 kg    Examination: General: Adult male, critically ill, lying in bed, NAD HEENT: MM pink/moist, anicteric, atraumatic, neck supple Neuro: *A&O x 1, follows simple  commands, PERRL +3 , MAE CV: s1s2 RRR, NSR w/ BBB on monitor, no r/m/g Pulm: Regular, non labored on BiPAP at 65%, breath sounds diminished throughout GI: soft, rounded, non tender, bs x 4 GU: foley in place with clear yellow urine Skin: Scattered ecchymosis, no rashes/lesions noted Extremities: warm/dry, pulses + 2 R/P, no edema noted  Resolved Hospital Problem list     Assessment & Plan:  Severe ACUTE Hypoxic and Hypercapnic Respiratory Failure Pneumonia -Continue supplemental oxygen as needed to maintain SPO2 greater than 90%, including BiPAP as needed -Continue linezolid and cefepime for Pseudomonas and MRSA coverage -Intermittent chest x-ray and ABG as needed -Bronchodilators as needed -Trend PCT, monitor WBC/fever curve  ACUTE SYSTOLIC CARDIAC FAILURE - EF 16-10% likely related to ETOH and drug abuse need to assess for ischemia PMHx: HTN, CAD -Continue carvedilol twice daily, once patient has passed swallow eval -Continue furosemide 20 mg twice daily, additional one-time 40 mg of furosemide given - 11/22, per cardiology recommendations -Nitroglycerin  drip initiated for blood pressure control per cardiology  until cardiac p.o. meds can be taken safely  SHOCK-CARDIOGENIC due to tachyarrhythmias S/p electrical cardioversion.  Off pressors since 01/24/20. IV amiodarone discontinued due to torsades --> reassessed by EP 11/16, no past evidence of torsades.  Transitioned from IV lidocaine back to IV amiodarone per EP Dr. Lalla Brothers. -Continue amiodarone IV, consider transitioning to p.o. once patient has cleared swallow eval -Maintain potassium greater than 4, magnesium greater than 2 -Cardiology following appreciate input -Continuous cardiac monitoring -Supplemental O2 as needed to maintain SPO2 greater than 90%  Paroxysmal atrial fibrillation Patient is currently maintaining normal sinus rhythm -Continue amiodarone IV, consider transitioning to p.o. once patient is tolerating p.o. safely - continue heparin drip, consider transitioning back to Eliquis once he is able to take p.o. safely -Continuous cardiac monitoring  ACUTE KIDNEY INJURY/Renal Failure - Strict I/O's: alert provider if UOP < 0.5 mL/kg/hr - Daily BMP, replace electrolytes PRN - Avoid nephrotoxic agents as able, ensure adequate renal perfusion  Acute metabolic encephalopathy secondary to suspected delirium -Continue safety sitter -Precedex drip/ haldol PRN as needed for agitation -Continue diazepam as needed for anxiety  Best practice:  Diet: N.p.o. Pain/Anxiety/Delirium protocol (if indicated): N/A VAP protocol (if indicated): N/A DVT prophylaxis: Heparin drip GI prophylaxis: Pepcid Glucose control: Every 4 CBG with sliding scale insulin Mobility: Bedrest, mobilize as tolerated Code Status: FULL Family Communication: Updated patient bedside, will update family as needed- 11/22 Disposition: ICU  Labs   CBC: Recent Labs  Lab 01/24/20 0425 01/25/20 0415 01/26/20 0509 01/26/20 2339 01/27/20 0417  WBC 15.2* 14.8* 17.1* 15.7* 15.4*  NEUTROABS 10.5* 10.3* 12.5* 10.8* 11.1*  HGB 14.4 13.8 13.9 14.3 14.9  HCT 48.7 46.0  44.4 46.3 47.7  MCV 69.5* 69.0* 67.0* 67.1* 67.2*  PLT 917* 931* 953* 1,018* 1,005*    Basic Metabolic Panel: Recent Labs  Lab 01/23/20 0138 01/23/20 0500 01/23/20 1832 01/24/20 0425 01/24/20 1551 01/25/20 0415 01/25/20 1538 01/25/20 1650 01/26/20 0509 01/27/20 0417  NA 138  --   --  139  --   --   --  141 141 141  K 4.2  --   --  4.0  --   --   --  3.1* 3.1* 3.8  CL 106  --   --  107  --   --   --  105 110 107  CO2 21*  --   --  26  --   --   --  25 23 18*  GLUCOSE 203*  --   --  179*  --   --   --  119* 115* 168*  BUN 27*  --   --  25*  --   --   --  27* 21 17  CREATININE 0.82  --   --  0.74  --   --   --  0.83 0.73 0.88  CALCIUM 8.3*  --   --  8.6*  --   --   --  8.9 8.8* 9.0  MG  --    < >   < >  --  1.7  --  1.6* 1.6* 1.8 1.8  PHOS 2.7  --   --  2.5  --  2.8  --   --  1.2* 2.6   < > = values in this interval not displayed.   GFR: Estimated Creatinine Clearance: 76.2 mL/min (by C-G formula based on SCr of 0.88 mg/dL). Recent Labs  Lab 01/25/20 0415 01/26/20  2458 01/26/20 2339 01/27/20 0417  PROCALCITON  --   --  1.90  --   WBC 14.8* 17.1* 15.7* 15.4*    Liver Function Tests: Recent Labs  Lab 01/25/20 1650  AST 40  ALT 19  ALKPHOS 97  BILITOT 0.8  PROT 7.1  ALBUMIN 2.5*   No results for input(s): LIPASE, AMYLASE in the last 168 hours. No results for input(s): AMMONIA in the last 168 hours.  ABG    Component Value Date/Time   PHART 7.42 01/25/2020 1411   PCO2ART 39 01/25/2020 1411   PO2ART 246 (H) 01/25/2020 1411   HCO3 25.3 01/25/2020 1411   ACIDBASEDEF 5.4 (H) 01/21/2020 1617   O2SAT 99.8 01/25/2020 1411     Coagulation Profile: No results for input(s): INR, PROTIME in the last 168 hours.  Cardiac Enzymes: No results for input(s): CKTOTAL, CKMB, CKMBINDEX, TROPONINI in the last 168 hours.  HbA1C: Hgb A1c MFr Bld  Date/Time Value Ref Range Status  11/30/2019 04:26 PM 6.4 (H) 4.8 - 5.6 % Final    Comment:    (NOTE) Pre diabetes:           5.7%-6.4%  Diabetes:              >6.4%  Glycemic control for   <7.0% adults with diabetes   08/14/2019 01:22 AM 6.0 (H) 4.8 - 5.6 % Final    Comment:    (NOTE) Pre diabetes:          5.7%-6.4% Diabetes:              >6.4% Glycemic control for   <7.0% adults with diabetes     CBG: Recent Labs  Lab 01/26/20 1543 01/26/20 1938 01/26/20 2326 01/27/20 0413 01/27/20 0710  GLUCAP 162* 150* 117* 152* 127*    Critical care time: 45 minutes       Betsey Holiday, AGACNP-BC Acute Care Nurse Practitioner [facility]   [phone] / [phone] Please see Amion for pager details.

## 2020-01-27 NOTE — Progress Notes (Addendum)
Daily Progress Note   Patient Name: Travis Maxwell       Date: 01/27/2020 DOB: 20-Feb-1951  Age: 69 y.o. MRN#: [id number] Attending Physician: Travis Fulling, MD Primary Care Physician: [facility] Admit Date: 01/06/2020  Reason for Consultation/Follow-up: Establishing goals of care  Subjective: Patient is resting in bed. He is extubated, and has between between nasal cannula and BIPAP.  He is alert though confused. He states initially he has 10 children, then later in the conversation he says he has 5 children. He states he has been aware of his heart issues, and states they have affected his QOL, but is unable to elaborate on this. He states he feels he has a lot of time left.  Spoke with daughter [name].  She states she has been coming to visit regularly. Updated her on status. She  was not aware, but is happy staff is making attempts to wean him from BIPAP. Discussed his confusion, and the workload on his heart. Discussed concern for poor prognosis. Plans for meeting with daughter [name] in the morning.      Length of Stay: 21  Current Medications: Scheduled Meds:  . allopurinol  300 mg Oral Daily  . aspirin  81 mg Oral Daily  . atorvastatin  40 mg Oral QHS  . carvedilol  3.125 mg Oral BID WC  . chlorhexidine  15 mL Mouth Rinse BID  . Chlorhexidine Gluconate Cloth  6 each Topical Daily  . docusate  100 mg Oral BID  . feeding supplement (PROSource TF)  45 mL Per Tube Daily  . fentaNYL (SUBLIMAZE) injection  25 mcg Intravenous Once  . furosemide  20 mg Intravenous Daily  . insulin aspart  0-9 Units Subcutaneous Q4H  . mouth rinse  15 mL Mouth Rinse q12n4p  . polyethylene glycol  17 g Oral Daily  . scopolamine  1 patch Transdermal Q72H  . senna-docusate  1 tablet Oral BID   . sodium chloride flush  10-40 mL Intracatheter Q12H  . sodium chloride flush  3 mL Intravenous Q12H    Continuous Infusions: . sodium chloride Stopped (01/26/20 0204)  . amiodarone 30 mg/hr (01/27/20 1040)  . ceFEPime (MAXIPIME) IV Stopped (01/27/20 0547)  . dexmedetomidine (PRECEDEX) IV infusion Stopped (01/26/20 1518)  . famotidine (PEPCID) IV 20 mg (01/27/20 1030)  .  feeding supplement (VITAL AF 1.2 CAL) 1,000 mL (01/24/20 1651)  . heparin 1,100 Units/hr (01/27/20 0600)  . linezolid (ZYVOX) IV 600 mg (01/27/20 1250)  . nitroGLYCERIN 5 mcg/min (01/27/20 1036)  . potassium chloride 10 mEq (01/27/20 1400)    PRN Meds: sodium chloride, acetaminophen, albuterol, diazepam, haloperidol lactate, metoprolol tartrate, ondansetron (ZOFRAN) IV, oxyCODONE, polyethylene glycol  Physical Exam Neurological:     Mental Status: He is alert.     Comments: Confused             Vital Signs: BP (!) 145/87   Pulse 97   Temp 99.7 F (37.6 C) (Axillary)   Resp (!) 34   Ht 5' 7.01" (1.702 m)   Wt 75.5 kg   SpO2 98%   BMI 26.06 kg/m  SpO2: SpO2: 98 % O2 Device: O2 Device: Bi-PAP O2 Flow Rate: O2 Flow Rate (L/min): 2 L/min  Intake/output summary:   Intake/Output Summary (Last 24 hours) at 01/27/2020 1410 Last data filed at 01/27/2020 1200 Gross per 24 hour  Intake 2261.27 ml  Output 2050 ml  Net 211.27 ml   LBM: Last BM Date: 01/27/20 Baseline Weight: Weight: 72.6 kg Most recent weight: Weight: 75.5 kg   Flowsheet Rows     Most Recent Value  Intake Tab  Referral Department Critical care  Unit at Time of Referral ICU  Palliative Care Primary Diagnosis Cardiac  Date Notified 01/13/20  Palliative Care Type New Palliative care  Reason for referral Clarify Goals of Care  Date of Admission 01/06/20  Date first seen by Palliative Care 01/14/20  # of days Palliative referral response time 1 Day(s)  # of days IP prior to Palliative referral 7  Clinical Assessment  Psychosocial &  Spiritual Assessment  Palliative Care Outcomes      Patient Active Problem List   Diagnosis Date Noted  . Ventricular tachycardia (HCC)   . Pressure injury of skin 01/21/2020  . Delirium   . Adult failure to thrive   . Palliative care by specialist   . DNR (do not resuscitate) discussion   . Malnutrition of moderate degree 01/07/2020  . Respiratory failure (HCC) 01/06/2020  . Elevated troponin 11/30/2019  . Leukocytosis 11/30/2019  . PAF (paroxysmal atrial fibrillation) (HCC) 11/30/2019  . Acute on chronic systolic congestive heart failure (HCC) 11/30/2019  . Pneumonia due to COVID-19 virus 11/30/2019  . Chest pain 08/13/2019  . Diabetes mellitus without complication (HCC)   . Gout   . Hypertension   . Dizziness   . Acute on chronic systolic CHF (congestive heart failure) (HCC)   . Tobacco abuse   . GERD (gastroesophageal reflux disease)   . CAD (coronary artery disease)   . NSTEMI (non-ST elevated myocardial infarction) (HCC) 07/24/2017    Palliative Care Assessment & Plan    Recommendations/Plan: Family meeting in the morning at 10:00 to speak with [name].  Continue full code and scope.   Code Status:    Code Status Orders  (From admission, onward)         Start     Ordered   01/06/20 1331  Full code  Continuous        01/06/20 1333        Code Status History    Date Active Date Inactive Code Status Order ID Comments User Context   11/30/2019 1512 12/02/2019 1908 Full Code [id number]  Lorretta Harp, MD ED   08/13/2019 1336 08/14/2019 2053 Full Code [id number]  Lorretta Harp, MD Inpatient  07/25/2017 0127 07/26/2017 1919 Full Code [id number]  Altamese Dilling, MD Inpatient   Advance Care Planning Activity      Prognosis: Poor overall   Care plan was discussed with RN  Thank you for allowing the Palliative Medicine Team to assist in the care of this patient.   Total Time 35 min Prolonged Time Billed no      Greater than 50%  of this time was spent  counseling and coordinating care related to the above assessment and plan.  M[place] Stall, NP  Please contact Palliative Medicine Team phone at [phone] for questions and concerns.

## 2020-01-27 NOTE — Progress Notes (Signed)
Progress Note  Patient Name: Travis Maxwell Date of Encounter: 01/27/2020  [facility] Cardiologist: Lorine Bears, MD   Subjective   The patient had respiratory distress last night and had to be placed back on on BiPAP after he was on nasal cannula.  Chest x-ray showed severe cardiomegaly and significant pulmonary vascular congestion.  He is also hypertensive at the present time.  He is still n.p.o. as he has not passed a swallowing study.  Inpatient Medications    Scheduled Meds: . allopurinol  300 mg Oral Daily  . aspirin  81 mg Oral Daily  . atorvastatin  40 mg Oral QHS  . carvedilol  3.125 mg Oral BID WC  . chlorhexidine  15 mL Mouth Rinse BID  . Chlorhexidine Gluconate Cloth  6 each Topical Daily  . docusate  100 mg Oral BID  . feeding supplement (PROSource TF)  45 mL Per Tube Daily  . fentaNYL (SUBLIMAZE) injection  25 mcg Intravenous Once  . furosemide  20 mg Intravenous Daily  . furosemide  40 mg Intravenous Once  . insulin aspart  0-9 Units Subcutaneous Q4H  . mouth rinse  15 mL Mouth Rinse q12n4p  . polyethylene glycol  17 g Oral Daily  . scopolamine  1 patch Transdermal Q72H  . senna-docusate  1 tablet Oral BID  . sodium chloride flush  10-40 mL Intracatheter Q12H  . sodium chloride flush  3 mL Intravenous Q12H   Continuous Infusions: . sodium chloride Stopped (01/26/20 0204)  . amiodarone 30 mg/hr (01/27/20 0600)  . ceFEPime (MAXIPIME) IV Stopped (01/27/20 0547)  . dexmedetomidine (PRECEDEX) IV infusion Stopped (01/26/20 1518)  . famotidine (PEPCID) IV Stopped (01/26/20 1016)  . feeding supplement (VITAL AF 1.2 CAL) 1,000 mL (01/24/20 1651)  . heparin 1,100 Units/hr (01/27/20 0600)  . linezolid (ZYVOX) IV    . nitroGLYCERIN     PRN Meds: sodium chloride, acetaminophen, albuterol, diazepam, haloperidol lactate, metoprolol tartrate, ondansetron (ZOFRAN) IV, oxyCODONE, polyethylene glycol   Vital Signs    Vitals:   01/26/20 2200 01/27/20 0001  01/27/20 0500 01/27/20 0700  BP: (!) 157/88 (!) 154/92 (!) 152/105 (!) 148/95  Pulse: 95 99  87  Resp: (!) 36 (!) 33 13 (!) 31  Temp:      TempSrc:      SpO2: 91% 94%  98%  Weight:   75.5 kg   Height:        Intake/Output Summary (Last 24 hours) at 01/27/2020 0923 Last data filed at 01/27/2020 0600 Gross per 24 hour  Intake 2231.27 ml  Output 1375 ml  Net 856.27 ml   Last 3 Weights 01/27/2020 01/26/2020 01/25/2020  Weight (lbs) 166 lb 7.2 oz 158 lb 11.7 oz 158 lb 11.7 oz  Weight (kg) 75.5 kg 72 kg 72 kg      Telemetry    Sinus rhythm.  Frequent PVCs and ventricular bigeminy.  Sustained ventricular tachycardia on 11/20 around 12:50 PM.- Personally Reviewed  ECG    01/25/2020: Sinus rhythm.  Rate 67 bpm.  Frequent PVCs.  ECG.  Left axis deviation.  LVH.  Prior inferior infarct.  Cannot rule out posterior MI. - Personally Reviewed  Physical Exam   VS:  BP (!) 148/95   Pulse 87   Temp 99.7 F (37.6 C) (Axillary)   Resp (!) 31   Ht 5' 7.01" (1.702 m)   Wt 75.5 kg   SpO2 98%   BMI 26.06 kg/m  , BMI Body mass index is 26.06  kg/m. GENERAL:  Ill-appearing HEENT: Pupils equal round and reactive, fundi not visualized, oral mucosa unremarkable NECK:  No jugular venous distention, waveform within normal limits, carotid upstroke brisk and symmetric, no bruits LUNGS:  CTAB HEART:  RRR.  PMI not displaced or sustained,S1 and S2 within normal limits, no S3, no S4, no clicks, no rubs,  murmurs ABD:  Flat, positive bowel sounds normal in frequency in pitch, no bruits, no rebound, no guarding, no midline pulsatile mass, no hepatomegaly, no splenomegaly EXT:  2 plus pulses throughout, no edema, no cyanosis no clubbing SKIN:  No rashes no nodules NEURO:  Cranial nerves II through XII grossly intact, motor grossly intact throughout PSYCH:  Unable to assess.  Patient sedated   Labs    High Sensitivity Troponin:   Recent Labs  Lab 01/09/20 0152 01/16/20 0245 01/16/20 0702  01/16/20 0838 01/19/20 0433  TROPONINIHS 89* 934* 711* 674* 234*      Chemistry Recent Labs  Lab 01/25/20 1650 01/26/20 0509 01/27/20 0417  NA 141 141 141  K 3.1* 3.1* 3.8  CL 105 110 107  CO2 25 23 18*  GLUCOSE 119* 115* 168*  BUN 27* 21 17  CREATININE 0.83 0.73 0.88  CALCIUM 8.9 8.8* 9.0  PROT 7.1  --   --   ALBUMIN 2.5*  --   --   AST 40  --   --   ALT 19  --   --   ALKPHOS 97  --   --   BILITOT 0.8  --   --   GFRNONAA >60 >60 >60  ANIONGAP 11 8 16*     Hematology Recent Labs  Lab 01/26/20 0509 01/26/20 2339 01/27/20 0417  WBC 17.1* 15.7* 15.4*  RBC 6.63* 6.90* 7.10*  HGB 13.9 14.3 14.9  HCT 44.4 46.3 47.7  MCV 67.0* 67.1* 67.2*  MCH 21.0* 20.7* 21.0*  MCHC 31.3 30.9 31.2  RDW 26.5* 26.5* 27.3*  PLT 953* 1,018* 1,005*    BNP Recent Labs  Lab 01/26/20 2339  BNP 1,886.4*     DDimer No results for input(s): DDIMER in the last 168 hours.   Radiology    DG Chest Port 1 View  Result Date: 01/26/2020 CLINICAL DATA:  Acute respiratory failure with hypoxia. EXAM: PORTABLE CHEST 1 VIEW COMPARISON:  January 23, 2020 FINDINGS: The patient has been extubated. The right-sided PICC line is well position. The heart remains enlarged. The patient is status post prior median sternotomy and valve replacement. There is mild vascular congestion. There are small bilateral pleural effusions. There is no pneumothorax. IMPRESSION: 1. Well-positioned right-sided PICC line. 2. Cardiomegaly with persistent mild vascular congestion. 3. Small bilateral pleural effusions. Electronically Signed   By: Katherine Mantle M.D.   On: 01/26/2020 23:57    Cardiac Studies   LHC 01/24/20:  Previously placed Prox RCA to Mid RCA stent (unknown type) is widely patent.  Prox RCA lesion is 50% stenosed.   1.  Patent stent in a nondominant right coronary artery.  No significant coronary artery disease overall. 2.  The replaced aortic valve was not crossed. 3.  Right heart  catheterization showed mildly elevated filling pressures, minimal pulmonary hypertension and normal cardiac output.  Recommendations: The patient has no ischemic substrate for ventricular tachycardia.  He has nonischemic cardiomyopathy. His hemodynamics appear good overall.  Recommend continuing same dose of IV furosemide 20 mg daily which can be switched to an oral diuretic after extubation. I am going to stop lidocaine drip and continue  amiodarone drip which can subsequently be switched to oral after extubation. I elected to add small dose carvedilol to assist with his cardiomyopathy and ventricular tachycardia.  Echo 01/21/20: 1. Left ventricular ejection fraction, by estimation, is 20 to 25%. The  left ventricle has severely decreased function. The left ventricle  demonstrates regional wall motion abnormalities (see scoring  diagram/findings for description). The left  ventricular internal cavity size was moderately dilated. There is mild  left ventricular hypertrophy. Left ventricular diastolic parameters are  consistent with Grade I diastolic dysfunction (impaired relaxation).  Elevated left atrial pressure. There is  severe global hypokinesis with relative sparing of the basal segments.  2. Right ventricular systolic function is moderately reduced. The right  ventricular size is mildly enlarged. Tricuspid regurgitation signal is  inadequate for assessing PA pressure.  3. Left atrial size was mildly dilated.  4. The mitral valve is grossly normal. Mild mitral valve regurgitation.  No evidence of mitral stenosis.  5. The aortic valve was not well visualized. Aortic valve regurgitation  is not visualized. Echo findings are consistent with normal structure and  function of the aortic valve prosthesis.  6. Pulmonic valve regurgitation not well assessed.    Patient Profile     [phone] with CAD status post PCI, chronic systolic diastolic heart failure, diabetes, tobacco abuse,  status post bioprosthetic aortic valve, paroxysmal atrial fibrillation, hypertension, hyperlipidemia, and THC abuse admitted with ventricular tachycardia requiring defibrillation and hypoxic respiratory failure due to MRSA and Pseudomonas pneumonia.    Assessment & Plan    # Sustained VT:  Patient had pulseless VT requiring defibrillation.  He subsequently underwent left heart cath which showed nonobstructive CAD.  Amiodarone was initially held due to concern for QT prolongation.  However per EP it was recommended to be resumed.  He was also transiently on lidocaine which has been discontinued.  Continue IV amiodarone for now until he is able to take oral medications and then we can transition to 400 mg by mouth twice daily.  Continue to replete electrolytes to maintain potassium greater than 4 and magnesium greater than 2. If he is neurologically intact he will need an ICD for secondary prevention.  # CAD s/p PCI:  Stable, non-obstructive disease with patent stents on cath.  Continue aspirin, carvedilol, and atorvastatin.   # Chronic systolic and diastolic heart failure:  Since sedation has been weaned down, the patient is hypertensive and he went into volume overload yesterday necessitating going back on BiPAP.  I reviewed his chest x-ray with Dr. Jayme Cloud.  We will going to give him 1 dose of IV Lasix 40 mg daily.  He benefits from initiation of heart failure medications including carvedilol, Entresto and spironolactone.  However, he is still not taking oral meds yet.  I am concerned about his heart failure being worsened by uncontrolled hypertension and thus I am going to start him on a nitroglycerin drip until he is able to take oral medications.  # PAF:   He is maintaining sinus rhythm.  Continue IV amiodarone and heparin as above.  Ultimately he will need to go back on Eliquis.  Transition back to Eliquis once he is able to tolerate orals.        For questions or updates, please contact [facility] Please consult [email or web address] for contact info under        Signed, Lorine Bears, MD  01/27/2020, 9:23 AM

## 2020-01-27 NOTE — Progress Notes (Signed)
Nutrition Follow-up  DOCUMENTATION CODES:   Non-severe (moderate) malnutrition in context of chronic illness  INTERVENTION:  Will monitor for diet advancement following SLP evaluation.   Once diet able to be advanced recommend: -Ensure Enlive po TID, each supplement provides 350 kcal and 20 grams of protein -MVI po daily  NUTRITION DIAGNOSIS:   Moderate Malnutrition related to chronic illness (CHF) as evidenced by moderate fat depletion, moderate muscle depletion.  Ongoing.  GOAL:   Patient will meet greater than or equal to 90% of their needs  Not met at this time.  MONITOR:   Vent status, Labs, Weight trends, TF tolerance, I & O's  REASON FOR ASSESSMENT:   Ventilator, Consult Enteral/tube feeding initiation and management  ASSESSMENT:   69 year old male with PMHx of HTN, DM, gout, hx AVR with bioprosthetic valve, GERD, CAD, CHF (EF 20%), recent COVID-19 11/30/2019 admitted with respiratory failure, severe metabolic acidosis, CHF exacerbation.   11/1 intubated 11/6 extubated 11/9 diet advanced to dysphagia 1 with thin liquids following SLP evaluation 11/14 reintubated 11/20 extubated  Met with patient at bedside this morning after rounds. He had been taken off BiPAP. Patient reports he is hungry. He denies any N/V or abdominal pain. Awaiting SLP evaluation. Per chart will likely occur tomorrow morning. Patient is amenable to drinking oral nutrition supplements once diet is able to be advanced.   Medications reviewed and include: Lasix 20 mg daily IV, Novolog 0-9 units Q4hrs, Miralax, senna-docusate, amiodarone gtt, cefepime, famotidine, heparin infusion, linezolid, nitroglycerin, potassium chloride 10 mEq IV x 4 today.  Labs reviewed: CBG 127-152, CO2 18.  Discussed with RN and on rounds.  Diet Order:   Diet Order            Diet NPO time specified Except for: Sips with Meds, Other (See Comments)  Diet effective now                EDUCATION NEEDS:   No  education needs have been identified at this time  Skin:  Skin Assessment: Skin Integrity Issues: Skin Integrity Issues:: Stage II Stage II: sacrum (2.5cm x 1cm)  Last BM:  01/26/2020 - medium type 6  Height:   Ht Readings from Last 1 Encounters:  01/24/20 5' 7.01" (1.702 m)   Weight:   Wt Readings from Last 1 Encounters:  01/27/20 75.5 kg   Ideal Body Weight:  67.3 kg  BMI:  Body mass index is 26.06 kg/m.  Estimated Nutritional Needs:   Kcal:  2000-2200  Protein:  110-120 grams  Fluid:  1.5-1.8 L/day or per MD  Jacklynn Barnacle, MS, RD, LDN Pager number available on Amion

## 2020-01-27 NOTE — Progress Notes (Signed)
ANTICOAGULATION CONSULT NOTE   Pharmacy Consult for heparin Indication: atrial fibrillation  Allergies  Allergen Reactions   Penicillins Other (See Comments)    Has patient had a PCN reaction causing immediate rash, facial/tongue/throat swelling, SOB or lightheadedness with hypotension: Unknown Has patient had a PCN reaction causing severe rash involving mucus membranes or skin necrosis: Unknown Has patient had a PCN reaction that required hospitalization: Unknown Has patient had a PCN reaction occurring within the last 10 years: No If all of the above answers are "NO", then may proceed with Cephalosporin use.     Patient Measurements: Height: 5' 7.01" (170.2 cm) Weight: 75.5 kg (166 lb 7.2 oz) IBW/kg (Calculated) : 66.12 Heparin Dosing Weight: 67 kg  Vital Signs: Temp: 99.7 F (37.6 C) (11/21 2000) Temp Source: Axillary (11/21 2000) BP: 154/92 (11/22 0001) Pulse Rate: 99 (11/22 0001)  Labs: Recent Labs    01/25/20 0415 01/25/20 0415 01/25/20 1650 01/26/20 0509 01/26/20 0509 01/26/20 2339 01/27/20 0417  HGB 13.8   < >  --  13.9   < > 14.3 14.9  HCT 46.0   < >  --  44.4  --  46.3 47.7  PLT 931*   < >  --  953*  --  1,018* 1,005*  APTT 78*  --   --  76*  --   --  69*  HEPARINUNFRC 0.79*  --   --  0.69  --   --  0.46  CREATININE  --   --  0.83 0.73  --   --  0.88   < > = values in this interval not displayed.    Estimated Creatinine Clearance: 76.2 mL/min (by C-G formula based on SCr of 0.88 mg/dL).   Medical History: Past Medical History:  Diagnosis Date   CAD (coronary artery disease)    Chronic systolic (congestive) heart failure (HCC)    Diabetes mellitus without complication (HCC)    GERD (gastroesophageal reflux disease)    Gout    Hx of aortic valve replacement    Bioprosthetic valve   Hypertension       Assessment: 69 year old male with h/o CAD, HFrEF, aortic regurgitation and stenosis s/p bioprosthetic aortic valve, afib on Eliquis.  Patient with episodes of ventricular tachycardia requiring defibrillation multiple times. Patient has been on Eliquis here. Plan is for heart cath Friday after appropriate Eliquis washout. Plan to start heparin in interim.  11/17 0414 aPTT 65 11/17 1610 aPTT 53 11/18 0138 aPTT 151 11/18 0946 aPTT 76, HL 0.98  11/18 1617 aPTT 89 11/19 0425 aPTT 76, HL 0.88  11/20 0415 aPTT 78, HL 0.79  11/21 0509 aPTT 76, HL 0.69   Goal of Therapy:  Heparin level 0.3-0.7 units/ml aPTT 66-102 seconds Monitor platelets by anticoagulation protocol: Yes   Plan:  11/20 @ 0415:  APTT = 78,  HL = 0.79 Will continue pt on current rate and recheck aPTT and HL on 11/21 AM labs.   11/21 @ 0509:  APTT = 76, HL = 0.69 APTT and HL both within therapeutic range now.  Will use HL to guide dosing from here on. Will recheck HL on 11/22 with AM labs.   11/22 @ 0417:  APTT = 69, HL = 0.46 APTT and HL are both therapeutic, both in lower range of goal. Will recheck HL on 11/23 with AM labs.   Travis Maxwell D 01/27/2020 6:11 AM

## 2020-01-27 NOTE — Progress Notes (Signed)
SLP Cancellation Note  Patient Details Name: Travis Maxwell MRN: [id number] DOB: March 25, 1950   Cancelled treatment:       Reason Eval/Treat Not Completed: Medical issues which prohibited therapy;Patient not medically ready  Pt currently on BIPAP, per nursing plan to wean pt this afternoon, will follow tomorrow morning for possible Bedside Swallow Evaluation.   Lakenya Riendeau B. Dreama Saa M.S., CCC-SLP, [facility] Speech-Language Pathologist Rehabilitation Services Office [phone]   Jennett Tarbell Dreama Saa 01/27/2020, 10:05 AM

## 2020-01-28 DIAGNOSIS — Z515 Encounter for palliative care: Secondary | ICD-10-CM | POA: Diagnosis not present

## 2020-01-28 DIAGNOSIS — I472 Ventricular tachycardia: Secondary | ICD-10-CM | POA: Diagnosis not present

## 2020-01-28 DIAGNOSIS — Z7189 Other specified counseling: Secondary | ICD-10-CM | POA: Diagnosis not present

## 2020-01-28 LAB — BASIC METABOLIC PANEL
Anion gap: 12 (ref 5–15)
BUN: 15 mg/dL (ref 8–23)
CO2: 18 mmol/L — ABNORMAL LOW (ref 22–32)
Calcium: 8.7 mg/dL — ABNORMAL LOW (ref 8.9–10.3)
Chloride: 107 mmol/L (ref 98–111)
Creatinine, Ser: 0.85 mg/dL (ref 0.61–1.24)
GFR, Estimated: >60 mL/min (ref >60)
Glucose, Bld: 141 mg/dL — ABNORMAL HIGH (ref 70–99)
Potassium: 3.7 mmol/L (ref 3.5–5.1)
Sodium: 137 mmol/L (ref 135–145)

## 2020-01-28 LAB — MAGNESIUM
Magnesium: 2.1 mg/dL (ref 1.7–2.4)
Magnesium: 1.6 mg/dL — ABNORMAL LOW (ref 1.7–2.4)

## 2020-01-28 LAB — GLUCOSE, CAPILLARY
Glucose-Capillary: 132 mg/dL — ABNORMAL HIGH (ref 70–99)
Glucose-Capillary: 121 mg/dL — ABNORMAL HIGH (ref 70–99)
Glucose-Capillary: 148 mg/dL — ABNORMAL HIGH (ref 70–99)
Glucose-Capillary: 161 mg/dL — ABNORMAL HIGH (ref 70–99)
Glucose-Capillary: 148 mg/dL — ABNORMAL HIGH (ref 70–99)
Glucose-Capillary: 171 mg/dL — ABNORMAL HIGH (ref 70–99)

## 2020-01-28 LAB — HEPARIN LEVEL (UNFRACTIONATED): Heparin Unfractionated: 0.35 IU/mL (ref 0.30–0.70)

## 2020-01-28 LAB — PROCALCITONIN: Procalcitonin: 0.69 ng/mL

## 2020-01-28 LAB — PHOSPHORUS: Phosphorus: 2.1 mg/dL — ABNORMAL LOW (ref 2.5–4.6)

## 2020-01-28 MED ORDER — OLANZAPINE 2.5 MG PO TABS
2.5000 mg | ORAL_TABLET | Freq: Every day | ORAL | Status: DC
  Administered 2020-01-28 – 2020-01-30 (×3): 2.5 mg via ORAL
  Filled 2020-01-28 (×4): qty 1

## 2020-01-28 MED ORDER — POTASSIUM CHLORIDE 20 MEQ PO PACK
40.0000 meq | PACK | Freq: Once | ORAL | Status: AC
  Administered 2020-01-28: 40 meq via ORAL
  Filled 2020-01-28: qty 2

## 2020-01-28 MED ORDER — OLANZAPINE 2.5 MG PO TABS
2.5000 mg | ORAL_TABLET | Freq: Every day | ORAL | Status: DC
  Filled 2020-01-28: qty 1

## 2020-01-28 MED ORDER — POTASSIUM & SODIUM PHOSPHATES 280-160-250 MG PO PACK
1.0000 | PACK | Freq: Three times a day (TID) | ORAL | Status: AC
  Administered 2020-01-28 – 2020-01-29 (×4): 1 via ORAL
  Filled 2020-01-28 (×4): qty 1

## 2020-01-28 MED ORDER — SACUBITRIL-VALSARTAN 24-26 MG PO TABS
1.0000 | ORAL_TABLET | Freq: Two times a day (BID) | ORAL | Status: DC
  Administered 2020-01-28 – 2020-02-19 (×38): 1 via ORAL
  Filled 2020-01-28 (×47): qty 1

## 2020-01-28 MED ORDER — MAGNESIUM SULFATE 4 GM/100ML IV SOLN
4.0000 g | Freq: Once | INTRAVENOUS | Status: AC
  Administered 2020-01-28: 4 g via INTRAVENOUS
  Filled 2020-01-28: qty 100

## 2020-01-28 NOTE — Progress Notes (Addendum)
PHARMACY CONSULT NOTE  Pharmacy Consult for Electrolyte Monitoring and Replacement   Recent Labs: Potassium (mmol/L)  Date Value  01/28/2020 3.7   Magnesium (mg/dL)  Date Value  46/27/0350 2.1   Calcium (mg/dL)  Date Value  09/38/1829 8.7 (L)   Albumin (g/dL)  Date Value  93/71/6967 2.5 (L)   Phosphorus (mg/dL)  Date Value  89/38/1017 2.1 (L)   Sodium (mmol/L)  Date Value  01/28/2020 137   Assessment: 69 year old male transferred to ICU for acute toxic metabolic encephalopathy. Patient now more alert and awake. Overnight 11/10, patient with two runs pulseless VT, with second episode requiring CPR and defibrillation. Converted back into baseline bundle branch rhythm with rate in 50's-60's.  Pt received magnesium 2 gm during episode. Similar episodes 11/11 requiring defibrillation. Pharmacy to manage electrolytes.  Patient intubated 11/14 and extubated 11/20. Patient dec'd from Bi-PAP to [place] on 2L O2sat 93%. Unable to be evaluated for bedside swallow evaluation at this time. Per chart review, good UOP. Appears to be having diarrhea / frequent bowel movements.    Diuretics: IV Lasix 20 mg daily.   Goal of Therapy:  K > 4 Mg > 2 All other electrolytes within normal limits  Plan:  --Potassium 3.6 this AM to 3.7 this despite receiving IV KCl 10 mEq x 4. Will order PO KCl x1 given enteral access. Phos 2.1: will replete with Phos-NaK PO TIDAC Mg >2 and Ca WNL --Follow-up electrolytes with AM labs  Travis Maxwell  01/28/2020 9:03 AM

## 2020-01-28 NOTE — Progress Notes (Signed)
PHARMACY CONSULT NOTE  Pharmacy Consult for Electrolyte Monitoring and Replacement   Recent Labs: Potassium (mmol/L)  Date Value  01/28/2020 3.7   Magnesium (mg/dL)  Date Value  09/04/1599 1.6 (L)   Calcium (mg/dL)  Date Value  09/32/3557 8.7 (L)   Albumin (g/dL)  Date Value  32/20/2542 2.5 (L)   Phosphorus (mg/dL)  Date Value  70/62/3762 2.1 (L)   Sodium (mmol/L)  Date Value  01/28/2020 137   Assessment: 69 year old male transferred to ICU for acute toxic metabolic encephalopathy. Patient now more alert and awake. Overnight 11/10, patient with two runs pulseless VT, with second episode requiring CPR and defibrillation. Converted back into baseline bundle branch rhythm with rate in 50's-60's.  Pt received magnesium 2 gm during episode. Similar episodes 11/11 requiring defibrillation. Pharmacy to manage electrolytes.  Patient intubated 11/14 and extubated 11/20. Patient dec'd from Bi-PAP to [place] on 2L O2sat 93%. Unable to be evaluated for bedside swallow evaluation at this time. Per chart review, good UOP. Appears to be having diarrhea / frequent bowel movements.    Diuretics: IV Lasix 20 mg daily.   Goal of Therapy:  K > 4 Mg > 2 All other electrolytes within normal limits  Plan:  --Magnesium re-check this evening = 1.6. Will order IV magnesium sulfate 4 g x 1 --Potassium and phosphorous replaced earlier today --Consider discontinuing laxatives / gastric stimulating agents if patient is having diarrhea which may be contributing to losses --Follow-up electrolytes with AM labs  [name]  01/28/2020 6:33 PM

## 2020-01-28 NOTE — Evaluation (Signed)
Clinical/Bedside Swallow Evaluation Patient Details  Name: Travis Maxwell MRN: [id number] Date of Birth: 07/17/1950  Today's Date: 01/28/2020 Time: SLP Start Time (ACUTE ONLY): 0800 SLP Stop Time (ACUTE ONLY): 0825 SLP Time Calculation (min) (ACUTE ONLY): 25 min  Past Medical History:  Past Medical History:  Diagnosis Date  . CAD (coronary artery disease)   . Chronic systolic (congestive) heart failure (HCC)   . Diabetes mellitus without complication (HCC)   . GERD (gastroesophageal reflux disease)   . Gout   . Hx of aortic valve replacement    Bioprosthetic valve  . Hypertension    Past Surgical History:  Past Surgical History:  Procedure Laterality Date  . CORONARY STENT INTERVENTION N/A 07/25/2017   Procedure: CORONARY STENT INTERVENTION;  Surgeon: Alwyn Pea, MD;  Location: [facility] INVASIVE CV LAB;  Service: Cardiovascular;  Laterality: N/A;  . LEFT HEART CATH AND CORONARY ANGIOGRAPHY N/A 07/25/2017   Procedure: LEFT HEART CATH AND CORONARY ANGIOGRAPHY;  Surgeon: Lamar Blinks, MD;  Location: [facility] INVASIVE CV LAB;  Service: Cardiovascular;  Laterality: N/A;  . RIGHT/LEFT HEART CATH AND CORONARY ANGIOGRAPHY N/A 01/24/2020   Procedure: RIGHT/LEFT HEART CATH AND CORONARY ANGIOGRAPHY;  Surgeon: Iran Ouch, MD;  Location: [facility] INVASIVE CV LAB;  Service: Cardiovascular;  Laterality: N/A;   HPI:  69 year old male status post COVID-19 infection 9/21 presenting with acute hypoxic respiratory failure and chest pain failing BiPAP requiring emergent intubation and mechanical ventilation admitted to the ICU. Over the course of 22 days, pt has required several intubations with most recent intuabtion 11/14 until 11/20, transitioned to BIPAP and was weaned to nasal cannula on 11/22. Pt's initial Bedside Swallow Evaluation was performed on 01/14/2020 with recommendation of dysphagia 1 with thin liquids. Pt currently NPO.   Assessment / Plan / Recommendation Clinical Impression  Pt  presents with grossly adequate oropharyngeal abilities when consuming trials of solids and thin liquids via straw. Pt continues to be confused, delayed response times and inability to follow directions. Pt can also be difficult redirect to task at hand. Given these cognitive deficits, recommend conservative diet of dysphagia 2 with thin liquids via straw and medicine whole in puree (d/t cognitive deficits). Education provided to pt's nurse, Attending and cardiologist. ST will follow x 1 for diet toleration.     SLP Visit Diagnosis: Dysphagia, unspecified (R13.10)    Aspiration Risk  Mild aspiration risk    Diet Recommendation Dysphagia 2 (Fine chop);Thin liquid   Liquid Administration via: Straw Medication Administration: Whole meds with puree Supervision: Full supervision/cueing for compensatory strategies Compensations: Minimize environmental distractions;Slow rate;Small sips/bites Postural Changes: Seated upright at 90 degrees    Other  Recommendations Oral Care Recommendations: Oral care BID   Follow up Recommendations  (TBD)      Frequency and Duration min 2x/week  1 week       Prognosis Prognosis for Safe Diet Advancement: Fair Barriers to Reach Goals: Motivation;Time post onset;Severity of deficits;Behavior;Cognitive deficits      Swallow Study   General Date of Onset: 01/06/20 HPI: 69 year old male status post COVID-19 infection 9/21 presenting with acute hypoxic respiratory failure and chest pain failing BiPAP requiring emergent intubation and mechanical ventilation admitted to the ICU. Over the course of 22 days, pt has required several intubations with most recent intuabtion 11/14 until 11/20, transitioned to BIPAP and was weaned to nasal cannula on 11/22. Pt's initial Bedside Swallow Evaluation was performed on 01/14/2020 with recommendation of dysphagia 1 with thin liquids. Pt currently NPO. Type of  Study: Bedside Swallow Evaluation Previous Swallow Assessment: BSE -  11/9; recommended dysphagia 1 with thin liquids Diet Prior to this Study: NPO Temperature Spikes Noted: No Respiratory Status: Nasal cannula History of Recent Intubation: Yes Length of Intubations (days): 6 days Date extubated: 01/25/20 Behavior/Cognition: Alert;Confused Oral Cavity Assessment: Within Functional Limits Oral Care Completed by SLP: Recent completion by staff Oral Cavity - Dentition: Edentulous Vision: Functional for self-feeding Self-Feeding Abilities: Needs set up Patient Positioning: Upright in bed Baseline Vocal Quality: Low vocal intensity Volitional Cough: Cognitively unable to elicit Volitional Swallow: Unable to elicit    Oral/Motor/Sensory Function Overall Oral Motor/Sensory Function: Generalized oral weakness   Ice Chips Ice chips: Not tested   Thin Liquid Thin Liquid: Within functional limits Presentation: Straw;Self Fed    Nectar Thick Nectar Thick Liquid: Not tested   Honey Thick Honey Thick Liquid: Not tested   Puree Puree: Within functional limits Presentation: Self Fed;Spoon   Solid     Solid: Within functional limits Presentation: Self Fed Other Comments: graham cracker trials     Travis Maxwell M.S., CCC-SLP, [facility] Speech-Language Pathologist Rehabilitation Services Office [phone]  Travis Maxwell 01/28/2020,8:26 PM

## 2020-01-28 NOTE — Progress Notes (Addendum)
Daily Progress Note   Patient Name: Travis Maxwell       Date: 01/28/2020 DOB: Jul 11, 1950  Age: 69 y.o. MRN#: [id number] Attending Physician: Erin Fulling, MD Primary Care Physician: Center, [facility] Admit Date: 01/06/2020  Reason for Consultation/Follow-up: Establishing goals of care  Subjective: Patient is resting in bed.  He remains extubated and his daughter [place] S[place] is bedside.  He states he is happy to be extubated and denies any complaint.  He states that functionally prior to this admission he was completely independent.  His daughter confirms the same.  We discussed his diagnosis, prognosis, GOC, EOL wishes disposition and options.  A detailed discussion was had today regarding advanced directives. The difference between an aggressive medical intervention path and a comfort care path was discussed.  Values and goals of care important to patient and family were attempted to be elicited.  Discussed limitations of medical interventions to prolong quality of life in some situations and discussed the concept of human mortality. He does understand that his heart disease is progressive.  Patient states he wants to do anything possible to live as long as he can regardless of quality of life, pain or suffering.  He states independence is very important to him, but if it is the difference between life and death, he would rely on others to care for him if needed.  He further states that he would want to have ventilator support if indicated, and would be willing to live on a ventilator the rest of his life if needed, to try to keep his heart beating.  He states he would want to have CPR to try to restart his heart.  His daughter agrees with the statements.     Length of Stay: 22  Current  Medications: Scheduled Meds:  . allopurinol  300 mg Oral Daily  . aspirin  81 mg Oral Daily  . atorvastatin  40 mg Oral QHS  . carvedilol  3.125 mg Oral BID WC  . chlorhexidine  15 mL Mouth Rinse BID  . Chlorhexidine Gluconate Cloth  6 each Topical Daily  . docusate  100 mg Oral BID  . feeding supplement (PROSource TF)  45 mL Per Tube Daily  . fentaNYL (SUBLIMAZE) injection  25 mcg Intravenous Once  . furosemide  20 mg Intravenous Daily  .  insulin aspart  0-9 Units Subcutaneous Q4H  . mouth rinse  15 mL Mouth Rinse q12n4p  . OLANZapine  2.5 mg Per Tube QHS  . polyethylene glycol  17 g Oral Daily  . potassium & sodium phosphates  1 packet Oral TID AC & HS  . sacubitril-valsartan  1 tablet Oral BID  . scopolamine  1 patch Transdermal Q72H  . senna-docusate  1 tablet Oral BID  . sodium chloride flush  10-40 mL Intracatheter Q12H  . sodium chloride flush  3 mL Intravenous Q12H    Continuous Infusions: . sodium chloride Stopped (01/26/20 0204)  . amiodarone 30 mg/hr (01/28/20 1037)  . dexmedetomidine (PRECEDEX) IV infusion Stopped (01/26/20 1518)  . famotidine (PEPCID) IV 100 mL/hr at 01/28/20 0900  . feeding supplement (VITAL AF 1.2 CAL) 1,000 mL (01/24/20 1651)  . heparin 1,100 Units/hr (01/28/20 0900)  . linezolid (ZYVOX) IV 600 mg (01/28/20 1056)  . nitroGLYCERIN Stopped (01/27/20 1741)    PRN Meds: sodium chloride, acetaminophen, albuterol, diazepam, haloperidol lactate, metoprolol tartrate, ondansetron (ZOFRAN) IV, oxyCODONE, polyethylene glycol  Physical Exam Pulmonary:     Effort: Pulmonary effort is normal.  Neurological:     Mental Status: He is alert.     Comments: Conversive             Vital Signs: BP 133/84   Pulse 80   Temp 98.3 F (36.8 C) (Axillary)   Resp (!) 21   Ht 5' 7.01" (1.702 m)   Wt 71.9 kg   SpO2 94%   BMI 24.82 kg/m  SpO2: SpO2: 94 % O2 Device: O2 Device: Nasal Cannula O2 Flow Rate: O2 Flow Rate (L/min): 2 L/min  Intake/output  summary:   Intake/Output Summary (Last 24 hours) at 01/28/2020 1224 Last data filed at 01/28/2020 0900 Gross per 24 hour  Intake 2291.05 ml  Output 1125 ml  Net 1166.05 ml   LBM: Last BM Date: 01/28/20 Baseline Weight: Weight: 72.6 kg Most recent weight: Weight: 71.9 kg   Flowsheet Rows     Most Recent Value  Intake Tab  Referral Department Critical care  Unit at Time of Referral ICU  Palliative Care Primary Diagnosis Cardiac  Date Notified 01/13/20  Palliative Care Type New Palliative care  Reason for referral Clarify Goals of Care  Date of Admission 01/06/20  Date first seen by Palliative Care 01/14/20  # of days Palliative referral response time 1 Day(s)  # of days IP prior to Palliative referral 7  Clinical Assessment  Psychosocial & Spiritual Assessment  Palliative Care Outcomes      Patient Active Problem List   Diagnosis Date Noted  . Ventricular tachycardia (HCC)   . Pressure injury of skin 01/21/2020  . Delirium   . Adult failure to thrive   . Palliative care by specialist   . DNR (do not resuscitate) discussion   . Malnutrition of moderate degree 01/07/2020  . Respiratory failure (HCC) 01/06/2020  . Elevated troponin 11/30/2019  . Leukocytosis 11/30/2019  . PAF (paroxysmal atrial fibrillation) (HCC) 11/30/2019  . Acute on chronic systolic congestive heart failure (HCC) 11/30/2019  . Pneumonia due to COVID-19 virus 11/30/2019  . Chest pain 08/13/2019  . Diabetes mellitus without complication (HCC)   . Gout   . Hypertension   . Dizziness   . Acute on chronic systolic CHF (congestive heart failure) (HCC)   . Tobacco abuse   . GERD (gastroesophageal reflux disease)   . CAD (coronary artery disease)   . NSTEMI (non-ST  elevated myocardial infarction) (HCC) 07/24/2017    Palliative Care Assessment & Plan    Recommendations/Plan: Full code full scope  Code Status:    Code Status Orders  (From admission, onward)         Start     Ordered    01/06/20 1331  Full code  Continuous        01/06/20 1333        Code Status History    Date Active Date Inactive Code Status Order ID Comments User Context   11/30/2019 1512 12/02/2019 1908 Full Code [id number]  Lorretta Harp, MD ED   08/13/2019 1336 08/14/2019 2053 Full Code [id number]  Lorretta Harp, MD Inpatient   07/25/2017 0127 07/26/2017 1919 Full Code [id number]  Altamese Dilling, MD Inpatient   Advance Care Planning Activity      Prognosis: Poor overall   Care plan was discussed with RN  Thank you for allowing the Palliative Medicine Team to assist in the care of this patient.   Total Time 35 min Prolonged Time Billed no      Greater than 50%  of this time was spent counseling and coordinating care related to the above assessment and plan.  Morton Stall, NP  Please contact Palliative Medicine Team phone at [phone] for questions and concerns.

## 2020-01-28 NOTE — Progress Notes (Signed)
CRITICAL CARE NOTE  [name] presenting to the ED on 01/06/2020 in respiratory distress, complaining of chest pain and shortness of breath. He also reported orthopnea and increased lower extremity swelling. History was limited due to acuity of condition and respiratory status. Patient was placed on biPAP, but progressively worsened and was emergently intubated. Patient is a current 0.5ppd smoker and uses marijuana. He has a severe metabolic acidosis. Patient has a past medical history significant for HTN, T2DM, HLD, GERD, CAD (s/p PCI), AVR with bioprosthetic valve, systolic CHF with EF of 70%, and paroxysmal A. Fib, currently anticoagulated on Eliquis. EKG upon arrival to ED revealed sinus rhythm at a rate of 99bpm with RBBB and PVCs with ST elevation in II, III, avF, V4-V6. Cardiologist on call stated that STEMI criteria were not met and advised to manage CHF. Patient received IV diltiazem and reported improvement of symptoms. Labs were notable for high sensitivity troponin 71->91->89. BNP 1046, creatinine 1.26, BUN 21, potassium 5.4. CXR showed improved pulmonary edema since prior study with chronic pulmonary venous hypertension. Notably, patient was admitted to [facility] ED 12/04/2019 for acute respiratory failure and acute on chronic systolic CHF in the setting of COVID pneumonia.   SIGNIFICANT EVENTS  11/02 - severe cardiogenic shock, remains on vent 11/03 - severe respiratory failure, severe systolic CHF 26/37 - patient remains critically ill 11/05 - patient remains critically ill 11/06 - off pressors, extubated to biPAP 11/07 - remains extubated, febrile, MSSA+ tracheal culture - started ancef 11/08 - remains extubated, on 2L by nasal cannula. Afebrile today with white count of 21.3K, up from 15.7K yesterday. Still encephalopathic and drowsy. On and off Precedex. Went into narrow complex SVT,then progressed to wide complex tachycardiawas shocked and given a loading dose of amiodarone. Will  remain on amiodarone gtt.Cardiology at bedside.  11/09 - remains extubated, on 2L by nasal cannula. Afebrile today with white count of 19.2K, down since yesterday. BP soft overnight, was started on Neo-synephrine infusion. More alert than yesterday, but only oriented to self. Failed Yale 3oz swallow study early this morning, started coughing at the end of the study. Will need to remain NPO until cleared by SLP. Patient has been educated on this, yet continues to incessantly ask for water.  11/10 - patient clinically improved. He is alert communicative and in no distress. He ate entire meal without issues. Still has confusion intermittently during interview. Frequent PVCs on telemetry.  Will initiate OT/PT today for downgrade planning. 11/11 - patient minimally symptomatic, he again had episodes of Torsades today s/p 120Jx1 mag 2g.  Poor prognosis overall with advanced CHF.  11/12 - Dicussed case with cardiology. Antiarrythmic agents being modified. Will also call consult for Electrophysiology  11/13 - Patient with no arrythmia overnight. He remains on precedex and neosynephrine.  I spoke with cardiology Dr Rayann Heman regarding EP evaluation- he recommended to continue with current medical therapy and have cardiologist call to EP on Monday to review options.  He has 1:1 sitter and is resting in bed comfortably. Spoke with Velva Harman POA daughter to update on poor prognosis worsening clinical condition.  11/14 - further deterioration overnight.  Patient required reintubation and MV.  Updated next of kin daughter Velva Harman this am and sent message to cardiology to discuss poor prognosis with family.    11/15 - remains intubated and sedated, high risk for cardiac arrest and death February 18, 2023 - remains intubated and sedated, frequent PVCs overnight, 2g magnesium given, awaiting EP consult, patient becomes tachypneic and diaphoretic during  SBTs 11/17 - increased ectopy, seen by EP who advised weaning lidocaine and restarting  amiodarone, planning for right & left heart cath on Friday 11/19 with Dr. Fletcher Anon, discontinue Eliquis, start IV heparin interim, tracheal culture 11/14 positive for MRSA and pseudomonas, started cefepime and linezolid 11/18 - remains intubated and sedated, on continuous infusion of amiodarone and lidocaine, metoprolol discontinued by cardiology due to low blood pressures, right/left heart cath scheduled for tomorrow 11/19 - pt s/p L&RHC no obstructive lesions, RHC with normal pressures. Patient will be treated medically with transition to PO amio and plan for SBT with attempt to liberate.  11/20 - Patient was liberated from ventilator.  He had persistent VT post extubation, was fortunate to have cardiologist present on time and had immediate medical intervention with improvement. Still very poor prognosis long term. Spoke to granddaugther at bedside requested family meeting to re-address goals of care due to limited therapy available for patient.  11/21 - Patient was able to stay off ventilator last night. He was started on Precedex overnight and is currently sedated to RASS-1 on nasal canula, plan to wean down/off sedation.  11/22 - Patient sitting up in bed, no obvious distress on BiPAP, no acute events overnight, attempt transition to nasal cannula, on NTG drip for BP control per cardiology until PO meds can be taken 11/23 - Patient required Haldol and Valium overnight for agitation, on 2L by [place] today, passed bedside swallow study this morning, can transition to PO meds  CC  Follow up respiratory failure  SUBJECTIVE Patient remains critically ill Prognosis is guarded   BP (!) 151/94   Pulse 88   Temp 98.8 F (37.1 C) (Axillary)   Resp (!) 29   Ht 5' 7.01" (1.702 m)   Wt 71.9 kg   SpO2 95%   BMI 24.82 kg/m    I/O last 3 completed shifts: In: 2867.9 [I.V.:982.9; IV Piggyback:1885] Out: 2950 [Urine:2950] Total I/O In: 158.7 [I.V.:136.2; IV Piggyback:22.5] Out: -   SpO2: 95 % O2  Flow Rate (L/min): 2 L/min FiO2 (%): 65 %  Estimated body mass index is 24.82 kg/m as calculated from the following:   Height as of this encounter: 5' 7.01" (1.702 m).   Weight as of this encounter: 71.9 kg.   REVIEW OF SYSTEMS  ROS limited due to severe critical illness. Respiratory: positive for intermittent cough,negative for shortness of breath Cardiovascular:negative for palpitations, chest pain, lower extremity swelling Gastrointestinal:negative for abdominal pain, nausea/vomiting Neurological:negative for lightheadedness, syncope, headaches  Pressure Injury 01/20/20 Sacrum Posterior;Medial Stage 2 -  Partial thickness loss of dermis presenting as a shallow open injury with a red, pink wound bed without slough. (Active)  01/20/20 0800  Location: Sacrum  Location Orientation: Posterior;Medial  Staging: Stage 2 -  Partial thickness loss of dermis presenting as a shallow open injury with a red, pink wound bed without slough.  Wound Description (Comments):   Present on Admission: No    PHYSICAL EXAMINATION:  GENERAL: Chronically ill NAD HEAD: Normocephalic, atraumatic.  EYES: Pupils equal, round, reactive to light.  No scleral icterus.  MOUTH: Moist mucosal membrane. NECK: Supple. No JVD. PULMONARY: Mildly tachypneic, unlabored, breath sounds diminished throughout CARDIOVASCULAR: S1 and S2. Regular rate and rhythm. No murmurs, rubs, or gallops.  GASTROINTESTINAL: Soft, nontender, non-distended.  Positive bowel sounds.   MUSCULOSKELETAL: No swelling, clubbing, or edema.  NEUROLOGIC: A&O x 1, follows simple commands, confused GCS<10 SKIN: Intact, warm, dry  MEDICATIONS: I have reviewed all medications and confirmed regimen as  documented Scheduled Meds: . allopurinol  300 mg Oral Daily  . aspirin  81 mg Oral Daily  . atorvastatin  40 mg Oral QHS  . carvedilol  3.125 mg Oral BID WC  . chlorhexidine  15 mL Mouth Rinse BID  . Chlorhexidine Gluconate Cloth  6 each  Topical Daily  . docusate  100 mg Oral BID  . feeding supplement (PROSource TF)  45 mL Per Tube Daily  . fentaNYL (SUBLIMAZE) injection  25 mcg Intravenous Once  . furosemide  20 mg Intravenous Daily  . insulin aspart  0-9 Units Subcutaneous Q4H  . mouth rinse  15 mL Mouth Rinse q12n4p  . polyethylene glycol  17 g Oral Daily  . sacubitril-valsartan  1 tablet Oral BID  . scopolamine  1 patch Transdermal Q72H  . senna-docusate  1 tablet Oral BID  . sodium chloride flush  10-40 mL Intracatheter Q12H  . sodium chloride flush  3 mL Intravenous Q12H   Continuous Infusions: . sodium chloride Stopped (01/26/20 0204)  . amiodarone 30 mg/hr (01/28/20 1037)  . dexmedetomidine (PRECEDEX) IV infusion Stopped (01/26/20 1518)  . famotidine (PEPCID) IV 100 mL/hr at 01/28/20 0900  . feeding supplement (VITAL AF 1.2 CAL) 1,000 mL (01/24/20 1651)  . heparin 1,100 Units/hr (01/28/20 0900)  . linezolid (ZYVOX) IV 600 mg (01/28/20 1056)  . nitroGLYCERIN Stopped (01/27/20 1741)   PRN Meds:.sodium chloride, acetaminophen, albuterol, diazepam, haloperidol lactate, metoprolol tartrate, ondansetron (ZOFRAN) IV, oxyCODONE, polyethylene glycol   CULTURE RESULTS   Recent Results (from the past 240 hour(s))  Culture, respiratory     Status: None   Collection Time: 01/19/20  3:55 PM   Specimen: Tracheal Aspirate; Respiratory  Result Value Ref Range Status   Specimen Description   Final    TRACHEAL ASPIRATE Performed at [facility], [address] [place], [place] [address]    Special Requests   Final    NONE Performed at [facility], [address] [place], [place] [address]    Gram Stain   Final    MODERATE WBC PRESENT, PREDOMINANTLY PMN ABUNDANT GRAM POSITIVE COCCI IN PAIRS IN CLUSTERS ABUNDANT GRAM NEGATIVE RODS Performed at [facility], [place] [address], [place], [place] [address]    Culture   Final    ABUNDANT METHICILLIN RESISTANT STAPHYLOCOCCUS AUREUS MODERATE  PSEUDOMONAS AERUGINOSA    Report Status 01/22/2020 FINAL  Final   Organism ID, Bacteria METHICILLIN RESISTANT STAPHYLOCOCCUS AUREUS  Final   Organism ID, Bacteria PSEUDOMONAS AERUGINOSA  Final      Susceptibility   Methicillin resistant staphylococcus aureus - MIC*    CIPROFLOXACIN <=0.5 SENSITIVE Sensitive     ERYTHROMYCIN >=8 RESISTANT Resistant     GENTAMICIN <=0.5 SENSITIVE Sensitive     OXACILLIN >=4 RESISTANT Resistant     TETRACYCLINE 2 SENSITIVE Sensitive     VANCOMYCIN 1 SENSITIVE Sensitive     TRIMETH/SULFA <=10 SENSITIVE Sensitive     CLINDAMYCIN 1 INTERMEDIATE Intermediate     RIFAMPIN 1 SENSITIVE Sensitive     Inducible Clindamycin NEGATIVE Sensitive     * ABUNDANT METHICILLIN RESISTANT STAPHYLOCOCCUS AUREUS   Pseudomonas aeruginosa - MIC*    CEFTAZIDIME 2 SENSITIVE Sensitive     CIPROFLOXACIN <=0.25 SENSITIVE Sensitive     GENTAMICIN <=1 SENSITIVE Sensitive     IMIPENEM 1 SENSITIVE Sensitive     PIP/TAZO <=4 SENSITIVE Sensitive     CEFEPIME 2 SENSITIVE Sensitive     * MODERATE PSEUDOMONAS AERUGINOSA  Expectorated sputum assessment  w rflx to resp cult     Status: None   Collection Time: 01/26/20 11:39 PM   Specimen: Expectorated Sputum  Result Value Ref Range Status   Specimen Description EXPECTORATED SPUTUM  Final   Special Requests NONE  Final   Sputum evaluation   Final    THIS SPECIMEN IS ACCEPTABLE FOR SPUTUM CULTURE Performed at [facility], [address] [place], [place] [address]    Report Status 01/27/2020 FINAL  Final  Culture, respiratory     Status: None (Preliminary result)   Collection Time: 01/26/20 11:39 PM  Result Value Ref Range Status   Specimen Description   Final    EXPECTORATED SPUTUM Performed at [facility], [address] [place], [place] [address]    Special Requests   Final    NONE Reflexed from [id number] Performed at [facility], [address] [place], [place] [address]    Gram Stain   Final     MODERATE SQUAMOUS EPITHELIAL CELLS PRESENT FEW WBC PRESENT, PREDOMINANTLY PMN RARE GRAM POSITIVE COCCI RARE GRAM NEGATIVE RODS    Culture   Final    NO GROWTH 1 DAY Performed at [facility], [place] [address] [place], [place] [address]    Report Status PENDING  Incomplete    LABS  CBC Latest Ref Rng & Units 01/28/2020 01/27/2020 01/26/2020  WBC 4.0 - 10.5 K/uL 19.3(H) 15.4(H) 15.7(H)  Hemoglobin 13.0 - 17.0 g/dL 14.2 14.9 14.3  Hematocrit 39 - 52 % 45.0 47.7 46.3  Platelets 150 - 400 K/uL 972(HH) 1,005(HH) 1,018(HH)   BMP Latest Ref Rng & Units 01/28/2020 01/27/2020 01/27/2020  Glucose 70 - 99 mg/dL 141(H) - 168(H)  BUN 8 - 23 mg/dL 15 - 17  Creatinine 0.61 - 1.24 mg/dL 0.85 - 0.88  Sodium 135 - 145 mmol/L 137 - 141  Potassium 3.5 - 5.1 mmol/L 3.7 3.6 3.8  Chloride 98 - 111 mmol/L 107 - 107  CO2 22 - 32 mmol/L 18(L) - 18(L)  Calcium 8.9 - 10.3 mg/dL 8.7(L) - 9.0    Intake/Output Summary (Last 24 hours) at 01/28/2020 1204 Last data filed at 01/28/2020 0900 Gross per 24 hour  Intake 2291.05 ml  Output 1125 ml  Net 1166.05 ml    IMAGING    No results found.   SIGNIFICANT DIAGNOSTIC TESTS  01/21/20 - Echocardiogram: LVEF 20 to 25% with severely decreased function and severe global hypokinesis. Left ventricle moderately dilated with mild left ventricular hypertrophy.  Grade 1 diastolic dysfunction and elevated left atrial pressure.  Left atrium mildly dilated mild mitral valve regurgitation. 01/24/20 - Right/Left Heart Cath: right heart catheterization showed mildly elevated filling pressures, minimal pulmonary hypertension and normal cardiac output previously placed proximal RCA to mid RCA stent is widely patent. No significant coronary artery disease overall.   Nutrition Status: Nutrition Problem: Moderate Malnutrition Etiology: chronic illness (CHF) Signs/Symptoms: moderate fat depletion, moderate muscle depletion Interventions: Tube feeding,  MVI   Indwelling Urinary Catheter continued, requirement due to   Reason to continue Indwelling Urinary Catheter strict Intake/Output monitoring for hemodynamic instability   Central Line/ continued, requirement due to  Reason to continue [employer] Monitoring of central venous pressure or other hemodynamic parameters and poor IV access    ASSESSMENT AND PLAN SYNOPSIS [name] with severe respiratory failure due to metabolic acidosis with severe systolic CHF exacerbation, previous history of COVID-19 in the setting of drug abuse, undergoing therapy for HCAP, complicated by tachyarrhythmia and progressive  heart failure. Patient with recurrent respiratory failure requiring intubation. Remains extubated since 11/20.  Severe ACUTE Hypoxic  Respiratory Failure - Extubated 11/20, monitor secretions - Continue supplemental O2 to maintain SpO2 >90%, BiPAP as needed - MRSA and Pseudomonas pneumonia - c/w zyvox and cefepime - Palliative care following  ACUTE SYSTOLIC CARDIAC FAILURE - EF 20-25% likely related to ETOH and drug abuse need to assess for ischemia - Oxygen as needed - Lasix as tolerated - Follow up cardiac enzymes as indicated - Second opinion from [facility] --> recommending repeat echo and cardiac catheterization - Right and Left Heart Cath 11/19 with Dr. Fletcher Anon, discontinue Eliquis, initiate IV heparin interim - R&LHC showed non-obstructive disease with patent stents, may need ICD for secondary prevention - Passed swallow study 11/23 - resume PO cardiac meds  - Cardiology following - appreciate input  SHOCK-CARDIOGENIC due to tachyarrhythmias - S/p electrical cardioversion - Use vasopressors as needed to keep MAP > 65 - IV amiodarone discontinued due to torsades --> reassessed by EP 11/16, no past evidence of torsades - Transitioned from IV lidocaine back to IV amiodarone per EP Dr. Quentin Ore - Weaned off lidocaine after cardiac catheterization 11/19 - Per cardiology: maintain  potassium > 4.0 and magnesium > 2.0 - Passed swallow study 11/23 - still on IV amiodarone and heparin - Transition back to PO amiodarone and Eliquis prior to discharge  ACUTE KIDNEY INJURY/Renal Failure - Continue Foley Catheter - assess need - Avoid nephrotoxic agents - Follow urine output, BMP - Ensure adequate renal perfusion, optimize oxygenation - Renal dose medications  NEUROLOGY - Acute metabolic encephalopathy, need for sedation - Continue safety sitter - Previously requiring precedex drip, haldol, diazepam PRN for agitation/anxiety - Start scheduled Zyprexa daily at bedtime  CARDIAC - ICU monitoring - Telemetry showing sinus rhythm with PVCs  ID  - Tracheal culture 11/14 positive for MRSA and pseudomonas - Continue IV abx as prescribed  GI - GI PROPHYLAXIS as indicated - DIET --> advanced to mechanical soft - Constipation protocol as indicated  ENDO - Will use ICU hypoglycemic\Hyperglycemia protocol if indicated  ELECTROLYTES - Follow labs as needed - Replace as needed - Pharmacy consultation and following   DVT/GI PRX ordered and assessed TRANSFUSIONS AS NEEDED MONITOR FSBS I Assessed the need for Labs I Assessed the need for Foley I Assessed the need for Central Venous Line Family Discussion when available I Assessed the need for Mobilization I made an Assessment of medications to be adjusted accordingly Safety Risk assessment completed   CASE DISCUSSED IN MULTIDISCIPLINARY ROUNDS WITH ICU TEAM  Critical Care Time devoted to patient care services described in this note is 40 minutes.   Overall, patient is critically ill, prognosis is guarded. Patient with Multiorgan failure and at high risk for cardiac arrest and death.   Malachi Bonds, PA student acted as my scribe    C. Derrill Kay, MD [facility]   *This note was dictated using voice recognition software/Dragon.  Despite best efforts to proofread, errors can occur which can change the  meaning.  Any change was purely unintentional.

## 2020-01-28 NOTE — Progress Notes (Signed)
ANTICOAGULATION CONSULT NOTE   Pharmacy Consult for heparin Indication: atrial fibrillation  Allergies  Allergen Reactions  . Penicillins Other (See Comments)    Has patient had a PCN reaction causing immediate rash, facial/tongue/throat swelling, SOB or lightheadedness with hypotension: Unknown Has patient had a PCN reaction causing severe rash involving mucus membranes or skin necrosis: Unknown Has patient had a PCN reaction that required hospitalization: Unknown Has patient had a PCN reaction occurring within the last 10 years: No If all of the above answers are "NO", then may proceed with Cephalosporin use.     Patient Measurements: Height: 5' 7.01" (170.2 cm) Weight: 71.9 kg (158 lb 8.2 oz) IBW/kg (Calculated) : 66.12 Heparin Dosing Weight: 67 kg  Vital Signs: Temp: 99.3 F (37.4 C) (11/23 0400) Temp Source: Oral (11/23 0400) BP: 119/70 (11/23 0600) Pulse Rate: 85 (11/23 0600)  Labs: Recent Labs    01/26/20 0509 01/26/20 0509 01/26/20 2339 01/26/20 2339 01/27/20 0417 01/28/20 0402  HGB 13.9   < > 14.3   < > 14.9 14.2  HCT 44.4   < > 46.3  --  47.7 45.0  PLT 953*   < > 1,018*  --  1,005* 972*  APTT 76*  --   --   --  69*  --   HEPARINUNFRC 0.69  --   --   --  0.46 0.35  CREATININE 0.73  --   --   --  0.88 0.85   < > = values in this interval not displayed.    Estimated Creatinine Clearance: 78.8 mL/min (by C-G formula based on SCr of 0.85 mg/dL).   Medical History: Past Medical History:  Diagnosis Date  . CAD (coronary artery disease)   . Chronic systolic (congestive) heart failure (HCC)   . Diabetes mellitus without complication (HCC)   . GERD (gastroesophageal reflux disease)   . Gout   . Hx of aortic valve replacement    Bioprosthetic valve  . Hypertension       Assessment: 69 year old male with h/o CAD, HFrEF, aortic regurgitation and stenosis s/p bioprosthetic aortic valve, afib on Eliquis. Patient with episodes of ventricular tachycardia  requiring defibrillation multiple times. Patient has been on Eliquis here. Plan is for heart cath Friday after appropriate Eliquis washout. Plan to start heparin in interim.  11/17 0414 aPTT 65 11/17 1610 aPTT 53 11/18 0138 aPTT 151 11/18 0946 aPTT 76, HL 0.98  11/18 1617 aPTT 89 11/19 0425 aPTT 76, HL 0.88  11/20 0415 aPTT 78, HL 0.79  11/21 0509 aPTT 76, HL 0.69   Goal of Therapy:  Heparin level 0.3-0.7 units/ml aPTT 66-102 seconds Monitor platelets by anticoagulation protocol: Yes   Plan:  11/20 @ 0415:  APTT = 78,  HL = 0.79 Will continue pt on current rate and recheck aPTT and HL on 11/21 AM labs.   11/21 @ 0509:  APTT = 76, HL = 0.69 APTT and HL both within therapeutic range now.  Will use HL to guide dosing from here on. Will recheck HL on 11/22 with AM labs.   11/22 @ 0417:  APTT = 69, HL = 0.46 APTT and HL are both therapeutic, both in lower range of goal. Will recheck HL on 11/23 with AM labs.   11/23 @ 0402 HL 0.35, therapeutic.  Continue current rate and recheck in am. CBC stable.   Valrie Hart A 01/28/2020 7:06 AM

## 2020-01-28 NOTE — Progress Notes (Signed)
Progress Note  Patient Name: Travis Maxwell Date of Encounter: 01/28/2020  Primary Cardiologist: Kirke Corin  Subjective   No chest pain, dyspnea, palpitations, dizziness, obesity, or syncope.  BP in the 150s systolic.  Documented urine output 500 cc for the past 24 hours.  He remains net +4.9 L with admission.  Now taking oral medications.  Inpatient Medications    Scheduled Meds: . allopurinol  300 mg Oral Daily  . aspirin  81 mg Oral Daily  . atorvastatin  40 mg Oral QHS  . carvedilol  3.125 mg Oral BID WC  . chlorhexidine  15 mL Mouth Rinse BID  . Chlorhexidine Gluconate Cloth  6 each Topical Daily  . docusate  100 mg Oral BID  . feeding supplement (PROSource TF)  45 mL Per Tube Daily  . fentaNYL (SUBLIMAZE) injection  25 mcg Intravenous Once  . furosemide  20 mg Intravenous Daily  . insulin aspart  0-9 Units Subcutaneous Q4H  . mouth rinse  15 mL Mouth Rinse q12n4p  . OLANZapine  2.5 mg Per Tube QHS  . polyethylene glycol  17 g Oral Daily  . potassium & sodium phosphates  1 packet Oral TID AC & HS  . sacubitril-valsartan  1 tablet Oral BID  . scopolamine  1 patch Transdermal Q72H  . senna-docusate  1 tablet Oral BID  . sodium chloride flush  10-40 mL Intracatheter Q12H  . sodium chloride flush  3 mL Intravenous Q12H   Continuous Infusions: . sodium chloride Stopped (01/26/20 0204)  . amiodarone 30 mg/hr (01/28/20 1037)  . dexmedetomidine (PRECEDEX) IV infusion Stopped (01/26/20 1518)  . famotidine (PEPCID) IV 100 mL/hr at 01/28/20 0900  . feeding supplement (VITAL AF 1.2 CAL) 1,000 mL (01/24/20 1651)  . heparin 1,100 Units/hr (01/28/20 0900)  . linezolid (ZYVOX) IV 600 mg (01/28/20 1056)  . nitroGLYCERIN Stopped (01/27/20 1741)   PRN Meds: sodium chloride, acetaminophen, albuterol, diazepam, haloperidol lactate, metoprolol tartrate, ondansetron (ZOFRAN) IV, oxyCODONE, polyethylene glycol   Vital Signs    Vitals:   01/28/20 0900 01/28/20 1000 01/28/20 1100  01/28/20 1200  BP: (!) 151/94 121/82 136/82 133/84  Pulse: 88 77 79 80  Resp: (!) 29 (!) 33 (!) 34 (!) 21  Temp:    98.3 F (36.8 C)  TempSrc:    Axillary  SpO2: 95% 96% 94% 94%  Weight:      Height:        Intake/Output Summary (Last 24 hours) at 01/28/2020 1243 Last data filed at 01/28/2020 0900 Gross per 24 hour  Intake 2291.05 ml  Output 1125 ml  Net 1166.05 ml   Filed Weights   01/26/20 0500 01/27/20 0500 01/28/20 0358  Weight: 72 kg 75.5 kg 71.9 kg    Telemetry    SR - Personally Reviewed  ECG    No new tracings - Personally Reviewed  Physical Exam   GEN: Ill appearing; no acute distress.   Neck: No JVD. Cardiac: RRR, no murmurs, rubs, or gallops.  Respiratory: Diminished breath sounds bilaterally.  GI: Soft, nontender, non-distended.   MS: No edema; No deformity. Neuro:  Alert and oriented x 3; Nonfocal.  Psych: Normal affect.  Labs    Chemistry Recent Labs  Lab 01/25/20 1650 01/25/20 1650 01/26/20 0509 01/26/20 0509 01/27/20 0417 01/27/20 1727 01/28/20 0402  NA 141   < > 141  --  141  --  137  K 3.1*   < > 3.1*   < > 3.8 3.6 3.7  CL 105   < > 110  --  107  --  107  CO2 25   < > 23  --  18*  --  18*  GLUCOSE 119*   < > 115*  --  168*  --  141*  BUN 27*   < > 21  --  17  --  15  CREATININE 0.83   < > 0.73  --  0.88  --  0.85  CALCIUM 8.9   < > 8.8*  --  9.0  --  8.7*  PROT 7.1  --   --   --   --   --   --   ALBUMIN 2.5*  --   --   --   --   --   --   AST 40  --   --   --   --   --   --   ALT 19  --   --   --   --   --   --   ALKPHOS 97  --   --   --   --   --   --   BILITOT 0.8  --   --   --   --   --   --   GFRNONAA >60   < > >60  --  >60  --  >60  ANIONGAP 11   < > 8  --  16*  --  12   < > = values in this interval not displayed.     Hematology Recent Labs  Lab 01/26/20 2339 01/27/20 0417 01/28/20 0402  WBC 15.7* 15.4* 19.3*  RBC 6.90* 7.10* 6.78*  HGB 14.3 14.9 14.2  HCT 46.3 47.7 45.0  MCV 67.1* 67.2* 66.4*  MCH 20.7*  21.0* 20.9*  MCHC 30.9 31.2 31.6  RDW 26.5* 27.3* 26.8*  PLT 1,018* 1,005* 972*    Cardiac EnzymesNo results for input(s): TROPONINI in the last 168 hours. No results for input(s): TROPIPOC in the last 168 hours.   BNP Recent Labs  Lab 01/26/20 2339  BNP 1,886.4*     DDimer No results for input(s): DDIMER in the last 168 hours.   Radiology    DG Chest Port 1 View  Result Date: 01/26/2020 IMPRESSION: 1. Well-positioned right-sided PICC line. 2. Cardiomegaly with persistent mild vascular congestion. 3. Small bilateral pleural effusions. Electronically Signed   By: Katherine Mantle M.D.   On: 01/26/2020 23:57    Cardiac Studies   LHC 01/24/20:  Previously placed Prox RCA to Mid RCA stent (unknown type) is widely patent.  Prox RCA lesion is 50% stenosed.  1. Patent stent in a nondominant right coronary artery. No significant coronary artery disease overall. 2. The replaced aortic valve was not crossed. 3. Right heart catheterization showed mildly elevated filling pressures, minimal pulmonary hypertension and normal cardiac output.  Recommendations: The patient has no ischemic substrate for ventricular tachycardia. He has nonischemic cardiomyopathy. His hemodynamics appear good overall. Recommend continuing same dose of IV furosemide 20 mg daily which can be switched to an oral diuretic after extubation. I am going to stop lidocaine drip and continue amiodarone drip which can subsequently be switched to oral after extubation. I elected to add small dose carvedilol to assist with his cardiomyopathy and ventricular tachycardia.  Echo 01/21/20: 1. Left ventricular ejection fraction, by estimation, is 20 to 25%. The  left ventricle has severely decreased function. The left ventricle  demonstrates regional wall motion abnormalities (see  scoring  diagram/findings for description). The left  ventricular internal cavity size was moderately dilated. There is mild  left  ventricular hypertrophy. Left ventricular diastolic parameters are  consistent with Grade I diastolic dysfunction (impaired relaxation).  Elevated left atrial pressure. There is  severe global hypokinesis with relative sparing of the basal segments.  2. Right ventricular systolic function is moderately reduced. The right  ventricular size is mildly enlarged. Tricuspid regurgitation signal is  inadequate for assessing PA pressure.  3. Left atrial size was mildly dilated.  4. The mitral valve is grossly normal. Mild mitral valve regurgitation.  No evidence of mitral stenosis.  5. The aortic valve was not well visualized. Aortic valve regurgitation  is not visualized. Echo findings are consistent with normal structure and  function of the aortic valve prosthesis.  6. Pulmonic valve regurgitation not well assessed.  Patient Profile     69 y.o. male with history of CAD status post PCI, chronic systolic diastolic heart failure, diabetes, tobacco abuse, status post bioprosthetic aortic valve, paroxysmal atrial fibrillation, hypertension, hyperlipidemia, and THC abuse admitted with ventricular tachycardia requiring defibrillation and hypoxic respiratory failure due to MRSA and Pseudomonas pneumonia.   Assessment & Plan    1. Sustained VT: -Patient had pulseless VT requiring defibrillation.  He subsequently underwent left heart cath which showed nonobstructive CAD.  Amiodarone was initially held due to concern for QT prolongation.  However per EP it was recommended to be resumed.  He was also transiently on lidocaine which has been discontinued -Continue IV amiodarone for now with plans to transition to oral prior to discharge -Ultimately he may require an ICD for secondary prevention however he remains on IV antibiotics with an uptrending WBC count.  Therefore he may ultimately require a LifeVest at discharge while he continues to improve from his acute illness with plans to follow-up with EP  for consideration of ICD as an outpatient -Maintain potassium greater than 4.0 and magnesium greater than 2.0  2. CAD s/p PCI: -No symptoms concerning for angina -Cath this admission with nonobstructive disease and patent stents -Continue aspirin, carvedilol, and Lipitor  3. Chronic combined systolic and diastolic CHF: -He appears euvolemic and well compensated -Add Entresto 24/26 mg twice daily now that he is taking oral medications -Continue carvedilol -Look to escalate GDMT throughout his admission and in hospital follow-up -Daily weights -Strict I's and O's  4. PAF: -Maintaining sinus rhythm -Continue IV amiodarone and heparin gtt as above -Transition back to Eliquis prior to discharge  For questions or updates, please contact [facility] Please consult [email or web address] for contact info under Cardiology/STEMI.    Signed, Eula Listen, PA-C [facility] Pager: [phone] 01/28/2020, 12:43 PM

## 2020-01-29 DIAGNOSIS — I5023 Acute on chronic systolic (congestive) heart failure: Secondary | ICD-10-CM | POA: Diagnosis not present

## 2020-01-29 DIAGNOSIS — I472 Ventricular tachycardia: Secondary | ICD-10-CM | POA: Diagnosis not present

## 2020-01-29 LAB — CULTURE, RESPIRATORY W GRAM STAIN: Culture: NO GROWTH

## 2020-01-29 LAB — CBC WITH DIFFERENTIAL/PLATELET
Abs Immature Granulocytes: 0.36 10*3/uL — ABNORMAL HIGH (ref 0.00–0.07)
Abs Immature Granulocytes: 0.17 10*3/uL — ABNORMAL HIGH (ref 0.00–0.07)
Basophils Absolute: 0.3 10*3/uL — ABNORMAL HIGH (ref 0.0–0.1)
Basophils Absolute: 0.3 10*3/uL — ABNORMAL HIGH (ref 0.0–0.1)
Basophils Relative: 2 %
Basophils Relative: 2 %
Eosinophils Absolute: 0.6 10*3/uL — ABNORMAL HIGH (ref 0.0–0.5)
Eosinophils Absolute: 0.8 10*3/uL — ABNORMAL HIGH (ref 0.0–0.5)
Eosinophils Relative: 3 %
Eosinophils Relative: 5 %
HCT: 45 % (ref 39.0–52.0)
HCT: 47.3 % (ref 39.0–52.0)
Hemoglobin: 14.2 g/dL (ref 13.0–17.0)
Hemoglobin: 14.8 g/dL (ref 13.0–17.0)
Immature Granulocytes: 2 %
Immature Granulocytes: 1 %
Lymphocytes Relative: 14 %
Lymphocytes Relative: 17 %
Lymphs Abs: 2.6 10*3/uL (ref 0.7–4.0)
Lymphs Abs: 2.8 10*3/uL (ref 0.7–4.0)
MCH: 20.9 pg — ABNORMAL LOW (ref 26.0–34.0)
MCH: 20.9 pg — ABNORMAL LOW (ref 26.0–34.0)
MCHC: 31.6 g/dL (ref 30.0–36.0)
MCHC: 31.3 g/dL (ref 30.0–36.0)
MCV: 66.4 fL — ABNORMAL LOW (ref 80.0–100.0)
MCV: 66.8 fL — ABNORMAL LOW (ref 80.0–100.0)
Monocytes Absolute: 2.2 10*3/uL — ABNORMAL HIGH (ref 0.1–1.0)
Monocytes Absolute: 1.7 10*3/uL — ABNORMAL HIGH (ref 0.1–1.0)
Monocytes Relative: 11 %
Monocytes Relative: 10 %
Neutro Abs: 13.3 10*3/uL — ABNORMAL HIGH (ref 1.7–7.7)
Neutro Abs: 11.1 10*3/uL — ABNORMAL HIGH (ref 1.7–7.7)
Neutrophils Relative %: 68 %
Neutrophils Relative %: 65 %
Platelets: 972 10*3/uL (ref 150–400)
Platelets: 985 10*3/uL (ref 150–400)
RBC: 6.78 MIL/uL — ABNORMAL HIGH (ref 4.22–5.81)
RBC: 7.08 MIL/uL — ABNORMAL HIGH (ref 4.22–5.81)
RDW: 26.8 % — ABNORMAL HIGH (ref 11.5–15.5)
RDW: 26.7 % — ABNORMAL HIGH (ref 11.5–15.5)
Smear Review: NORMAL
Smear Review: INCREASED
WBC: 19.3 10*3/uL — ABNORMAL HIGH (ref 4.0–10.5)
WBC: 16.9 10*3/uL — ABNORMAL HIGH (ref 4.0–10.5)
nRBC: 0 % (ref 0.0–0.2)
nRBC: 0 % (ref 0.0–0.2)

## 2020-01-29 LAB — BASIC METABOLIC PANEL
Anion gap: 12 (ref 5–15)
BUN: 11 mg/dL (ref 8–23)
CO2: 19 mmol/L — ABNORMAL LOW (ref 22–32)
Calcium: 8.3 mg/dL — ABNORMAL LOW (ref 8.9–10.3)
Chloride: 106 mmol/L (ref 98–111)
Creatinine, Ser: 0.77 mg/dL (ref 0.61–1.24)
GFR, Estimated: >60 mL/min (ref >60)
Glucose, Bld: 139 mg/dL — ABNORMAL HIGH (ref 70–99)
Potassium: 3.5 mmol/L (ref 3.5–5.1)
Sodium: 137 mmol/L (ref 135–145)

## 2020-01-29 LAB — GLUCOSE, CAPILLARY
Glucose-Capillary: 131 mg/dL — ABNORMAL HIGH (ref 70–99)
Glucose-Capillary: 144 mg/dL — ABNORMAL HIGH (ref 70–99)
Glucose-Capillary: 191 mg/dL — ABNORMAL HIGH (ref 70–99)
Glucose-Capillary: 109 mg/dL — ABNORMAL HIGH (ref 70–99)
Glucose-Capillary: 201 mg/dL — ABNORMAL HIGH (ref 70–99)
Glucose-Capillary: 135 mg/dL — ABNORMAL HIGH (ref 70–99)

## 2020-01-29 LAB — PROCALCITONIN: Procalcitonin: 0.33 ng/mL

## 2020-01-29 LAB — MAGNESIUM
Magnesium: 2.1 mg/dL (ref 1.7–2.4)
Magnesium: 1.9 mg/dL (ref 1.7–2.4)

## 2020-01-29 LAB — HEPARIN LEVEL (UNFRACTIONATED): Heparin Unfractionated: 0.23 IU/mL — ABNORMAL LOW (ref 0.30–0.70)

## 2020-01-29 LAB — PHOSPHORUS: Phosphorus: 2.6 mg/dL (ref 2.5–4.6)

## 2020-01-29 MED ORDER — MAGNESIUM SULFATE 2 GM/50ML IV SOLN
2.0000 g | Freq: Once | INTRAVENOUS | Status: AC
  Administered 2020-01-29: 2 g via INTRAVENOUS
  Filled 2020-01-29: qty 50

## 2020-01-29 MED ORDER — ENSURE ENLIVE PO LIQD
237.0000 mL | Freq: Three times a day (TID) | ORAL | Status: DC
  Administered 2020-01-29 – 2020-02-02 (×6): 237 mL via ORAL
  Administered 2020-02-02: 120 mL via ORAL

## 2020-01-29 MED ORDER — POTASSIUM CHLORIDE 20 MEQ PO PACK
40.0000 meq | PACK | ORAL | Status: AC
  Administered 2020-01-29 (×2): 40 meq via ORAL
  Filled 2020-01-29 (×2): qty 2

## 2020-01-29 MED ORDER — APIXABAN 5 MG PO TABS
5.0000 mg | ORAL_TABLET | Freq: Two times a day (BID) | ORAL | Status: DC
  Administered 2020-01-29 (×2): 5 mg via ORAL
  Filled 2020-01-29 (×2): qty 1

## 2020-01-29 MED ORDER — AMIODARONE HCL 200 MG PO TABS
400.0000 mg | ORAL_TABLET | Freq: Two times a day (BID) | ORAL | Status: DC
  Administered 2020-01-29 (×2): 400 mg via ORAL
  Filled 2020-01-29 (×2): qty 2

## 2020-01-29 NOTE — Evaluation (Signed)
Occupational Therapy Evaluation Patient Details Name: Travis Maxwell MRN: [id number] DOB: 02/01/1951 Today's Date: 01/29/2020    History of Present Illness Pt is a 69 y/o M with PMH: COVID-19 Sept 2021 (adm to hospital at that time with PNA and CHF exacerbation), HTN, T2DM, HLD, GERD, CAD (s/p PCI), AVR with bioprosthetic valve, sCHF with EF of 20%, and paroxysmal A. Fib on Eliquis. Pt presented to ED d/t SOB and chest pain. Pt adm d/t cardiogenic shock. Pt was initially placed on BiPap, but oxygen status worsened and pt was emergently intubated. Pt initailly extubated 11/6, but required re-intubation on 11/14. Pt again extubated 11/20 and is on room air at time of evaluation.   Clinical Impression   Pt was seen for OT evaluation this date. Prior to hospital admission, pt reports being INDEP with all aspects of self care and mobility. Unsure of pt living situation as he is a poor historian, but previous therapy evaluations indicate he lived in a motel. Currently pt demonstrates impairments as described below (See OT problem list) which functionally limit his ability to perform ADL/self-care tasks. Pt currently requires MOD/MAX A for bed mobility, MIN/MOD A for bed level UB ADLs, and MAX/TOTAL A for LB ADLs.  Pt would benefit from skilled OT services to address noted impairments and functional limitations (see below for any additional details) in order to maximize safety and independence while minimizing falls risk and caregiver burden. Upon hospital discharge, recommend *STR to maximize pt safety and return to PLOF.     Follow Up Recommendations  SNF    Equipment Recommendations  Other (comment) (defer to next level of care)    Recommendations for Other Services       Precautions / Restrictions Precautions Precautions: Fall Restrictions Weight Bearing Restrictions: No      Mobility Bed Mobility Overal bed mobility: Needs Assistance Bed Mobility: Supine to Sit;Sit to Supine      Supine to sit: Mod assist;Max assist;HOB elevated Sit to supine: Max assist   General bed mobility comments: MAX A to manage LEs for back to bed    Transfers                 General transfer comment: NT    Balance Overall balance assessment: Needs assistance   Sitting balance-Leahy Scale: Fair Sitting balance - Comments: use of UEs to sustain static sitting balance, intermittently Poor static sitting requiring MIN A from therapist to correct.       Standing balance comment: NT                           ADL either performed or assessed with clinical judgement   ADL                                         General ADL Comments: Pt requires MIN/MOD A with UB bed level ADLs with hand over hand/tactile cues. Pt requires MAX/TOTAL A with bed level LB ADLs.     Vision Patient Visual Report: No change from baseline Additional Comments: difficult to formally assess d/t pt cognition     Perception     Praxis      Pertinent Vitals/Pain Pain Assessment: No/denies pain     Hand Dominance     Extremity/Trunk Assessment Upper Extremity Assessment Upper Extremity Assessment: Generalized weakness   Lower Extremity Assessment Lower Extremity  Assessment: Generalized weakness   Cervical / Trunk Assessment Cervical / Trunk Assessment: Kyphotic (weak trunk in static sitting, flexes)   Communication Communication Communication: No difficulties   Cognition Arousal/Alertness: Awake/alert;Lethargic (waxes and wanes throughout session) Behavior During Therapy: WFL for tasks assessed/performed;Impulsive Overall Cognitive Status: Impaired/Different from baseline Area of Impairment: Orientation;Attention;Memory;Following commands;Safety/judgement;Awareness;Problem solving                 Orientation Level: Disoriented to;Time;Situation Current Attention Level: Sustained Memory: Decreased short-term memory Following Commands: Follows one  step commands inconsistently;Follows one step commands with increased time Safety/Judgement: Decreased awareness of safety;Decreased awareness of deficits Awareness: Emergent Problem Solving: Slow processing;Decreased initiation;Difficulty sequencing;Requires verbal cues;Requires tactile cues     General Comments       Exercises Other Exercises Other Exercises: OT faciltiates ed re: role of OT, importance of OOB activity. Pt with poor reception for education this date, he is somewhat oriented and receptive, but will require extensive follow up.   Shoulder Instructions      Home Living                                   Additional Comments: Pt is poor historian at time of OT evaluation. Pt reports he lives with spouse in a 2 story home, but previous therapy evaluation at this facility inidicates pt was staying at a motel.      Prior Functioning/Environment Level of Independence: Independent        Comments: Again, unsure of efficacy of PLOF information, but pt reports being INDEP at baseline. That being said, whether he's been INDEP since Sept 2021 hospitalization with COVID is questionable at best.        OT Problem List: Decreased strength;Decreased activity tolerance;Impaired balance (sitting and/or standing);Decreased safety awareness;Decreased knowledge of use of DME or AE;Decreased cognition;Cardiopulmonary status limiting activity      OT Treatment/Interventions: Self-care/ADL training;DME and/or AE instruction;Therapeutic activities;Balance training;Therapeutic exercise;Energy conservation;Patient/family education    OT Goals(Current goals can be found in the care plan section) Acute Rehab OT Goals Patient Stated Goal: none stated OT Goal Formulation: Patient unable to participate in goal setting Time For Goal Achievement: 02/12/20 Potential to Achieve Goals: Good ADL Goals Pt Will Perform Upper Body Dressing: with supervision;sitting Pt Will Perform  Lower Body Dressing: with min assist;with mod assist;sitting/lateral leans Pt Will Transfer to Toilet: with min assist;with mod assist;stand pivot transfer;bedside commode Pt Will Perform Toileting - Clothing Manipulation and hygiene: with min assist;with mod assist;sit to/from stand Pt/caregiver will Perform Home Exercise Program: Increased strength;Both right and left upper extremity;With minimal assist  OT Frequency: Min 1X/week   Barriers to D/C:            Co-evaluation              AM-PAC OT "6 Clicks" Daily Activity     Outcome Measure Help from another person eating meals?: A Little Help from another person taking care of personal grooming?: A Little Help from another person toileting, which includes using toliet, bedpan, or urinal?: A Lot Help from another person bathing (including washing, rinsing, drying)?: A Lot Help from another person to put on and taking off regular upper body clothing?: A Lot Help from another person to put on and taking off regular lower body clothing?: Total 6 Click Score: 13   End of Session Nurse Communication: Mobility status  Activity Tolerance: Patient tolerated treatment well Patient left: in  bed;with call bell/phone within reach;with bed alarm set  OT Visit Diagnosis: Unsteadiness on feet (R26.81);Muscle weakness (generalized) (M62.81)                Time: 7209-4709 OT Time Calculation (min): 43 min Charges:  OT General Charges $OT Visit: 1 Visit OT Evaluation $OT Eval Moderate Complexity: 1 Mod OT Treatments $Self Care/Home Management : 8-22 mins $Therapeutic Activity: 8-22 mins  Rejeana Brock, MS, OTR/L ascom [phone] 01/29/20, 4:38 PM

## 2020-01-29 NOTE — Progress Notes (Signed)
Progress Note  Patient Name: Travis Maxwell Date of Encounter: 01/29/2020  Primary Cardiologist: Lorine Bears, MD   Subjective   No chest pain. No report of DOE, dizziness. Denies any difficulty breathing.  Difficult to understand when speaking. Motions frequently to pillows on a chair but shakes his head "no" when asked if he wants another pillow.  Reviewed plan for treatment.  Inpatient Medications    Scheduled Meds: . allopurinol  300 mg Oral Daily  . aspirin  81 mg Oral Daily  . atorvastatin  40 mg Oral QHS  . carvedilol  3.125 mg Oral BID WC  . chlorhexidine  15 mL Mouth Rinse BID  . Chlorhexidine Gluconate Cloth  6 each Topical Daily  . docusate  100 mg Oral BID  . feeding supplement (PROSource TF)  45 mL Per Tube Daily  . fentaNYL (SUBLIMAZE) injection  25 mcg Intravenous Once  . furosemide  20 mg Intravenous Daily  . insulin aspart  0-9 Units Subcutaneous Q4H  . mouth rinse  15 mL Mouth Rinse q12n4p  . OLANZapine  2.5 mg Oral QHS  . polyethylene glycol  17 g Oral Daily  . potassium & sodium phosphates  1 packet Oral TID AC & HS  . potassium chloride  40 mEq Oral Q2H  . sacubitril-valsartan  1 tablet Oral BID  . scopolamine  1 patch Transdermal Q72H  . senna-docusate  1 tablet Oral BID  . sodium chloride flush  10-40 mL Intracatheter Q12H  . sodium chloride flush  3 mL Intravenous Q12H   Continuous Infusions: . sodium chloride Stopped (01/26/20 0204)  . amiodarone 30 mg/hr (01/29/20 0400)  . dexmedetomidine (PRECEDEX) IV infusion Stopped (01/26/20 1518)  . famotidine (PEPCID) IV Stopped (01/28/20 0916)  . feeding supplement (VITAL AF 1.2 CAL) 1,000 mL (01/24/20 1651)  . heparin 1,200 Units/hr (01/29/20 0555)  . magnesium sulfate bolus IVPB 2 g (01/29/20 0807)  . nitroGLYCERIN Stopped (01/27/20 1741)   PRN Meds: sodium chloride, acetaminophen, albuterol, diazepam, haloperidol lactate, metoprolol tartrate, ondansetron (ZOFRAN) IV, oxyCODONE, polyethylene  glycol   Vital Signs    Vitals:   01/29/20 0400 01/29/20 0500 01/29/20 0600 01/29/20 0800  BP: (!) 148/114 112/88 (!) 147/72   Pulse: 84 (!) 42 83   Resp: (!) 25 (!) 29 (!) 28   Temp: 98.8 F (37.1 C)   98.6 F (37 C)  TempSrc: Oral   Oral  SpO2: 94% 96% 94%   Weight: 71.9 kg     Height:        Intake/Output Summary (Last 24 hours) at 01/29/2020 0815 Last data filed at 01/29/2020 0400 Gross per 24 hour  Intake 1658.75 ml  Output 575 ml  Net 1083.75 ml   Last 3 Weights 01/29/2020 01/28/2020 01/27/2020  Weight (lbs) 158 lb 8.2 oz 158 lb 8.2 oz 166 lb 7.2 oz  Weight (kg) 71.9 kg 71.9 kg 75.5 kg      Telemetry    NSR, PVCs/bigeminy/trigeminy - Personally Reviewed  ECG    No new tracings- Personally Reviewed  Physical Exam   GEN: No acute distress.  Ill appearing Neck: No JVD Cardiac: RRR with extrasystole, 2/6 systolic murmur, rubs, or gallops.  Respiratory:  Coarse breath sounds bilaterally. GI: Soft, nontender, non-distended  MS: No edema; No deformity. Neuro:  Nonfocal  Psych: Normal affect   Labs    High Sensitivity Troponin:   Recent Labs  Lab 01/09/20 0152 01/16/20 0245 01/16/20 0702 01/16/20 0838 01/19/20 0433  TROPONINIHS 89* 934*  711* 674* 234*      Chemistry Recent Labs  Lab 01/25/20 1650 01/26/20 0509 01/27/20 0417 01/27/20 0417 01/27/20 1727 01/28/20 0402 01/29/20 0351  NA 141   < > 141  --   --  137 137  K 3.1*   < > 3.8   < > 3.6 3.7 3.5  CL 105   < > 107  --   --  107 106  CO2 25   < > 18*  --   --  18* 19*  GLUCOSE 119*   < > 168*  --   --  141* 139*  BUN 27*   < > 17  --   --  15 11  CREATININE 0.83   < > 0.88  --   --  0.85 0.77  CALCIUM 8.9   < > 9.0  --   --  8.7* 8.3*  PROT 7.1  --   --   --   --   --   --   ALBUMIN 2.5*  --   --   --   --   --   --   AST 40  --   --   --   --   --   --   ALT 19  --   --   --   --   --   --   ALKPHOS 97  --   --   --   --   --   --   BILITOT 0.8  --   --   --   --   --   --    GFRNONAA >60   < > >60  --   --  >60 >60  ANIONGAP 11   < > 16*  --   --  12 12   < > = values in this interval not displayed.     Hematology Recent Labs  Lab 01/27/20 0417 01/28/20 0402 01/29/20 0351  WBC 15.4* 19.3* 16.9*  RBC 7.10* 6.78* 7.08*  HGB 14.9 14.2 14.8  HCT 47.7 45.0 47.3  MCV 67.2* 66.4* 66.8*  MCH 21.0* 20.9* 20.9*  MCHC 31.2 31.6 31.3  RDW 27.3* 26.8* 26.7*  PLT 1,005* 972* 985*    BNP Recent Labs  Lab 01/26/20 2339  BNP 1,886.4*     DDimer No results for input(s): DDIMER in the last 168 hours.   Radiology    No results found.  Cardiac Studies   LHC 01/24/20:  Previously placed Prox RCA to Mid RCA stent (unknown type) is widely patent.  Prox RCA lesion is 50% stenosed.  1. Patent stent in a nondominant right coronary artery. No significant coronary artery disease overall. 2. The replaced aortic valve was not crossed. 3. Right heart catheterization showed mildly elevated filling pressures, minimal pulmonary hypertension and normal cardiac output.  Recommendations: The patient has no ischemic substrate for ventricular tachycardia. He has nonischemic cardiomyopathy. His hemodynamics appear good overall. Recommend continuing same dose of IV furosemide 20 mg daily which can be switched to an oral diuretic after extubation. I am going to stop lidocaine drip and continue amiodarone drip which can subsequently be switched to oral after extubation. I elected to add small dose carvedilol to assist with his cardiomyopathy and ventricular tachycardia.  Echo 01/21/20: 1. Left ventricular ejection fraction, by estimation, is 20 to 25%. The  left ventricle has severely decreased function. The left ventricle  demonstrates regional wall motion abnormalities (see scoring  diagram/findings for  description). The left  ventricular internal cavity size was moderately dilated. There is mild  left ventricular hypertrophy. Left ventricular diastolic  parameters are  consistent with Grade I diastolic dysfunction (impaired relaxation).  Elevated left atrial pressure. There is  severe global hypokinesis with relative sparing of the basal segments.  2. Right ventricular systolic function is moderately reduced. The right  ventricular size is mildly enlarged. Tricuspid regurgitation signal is  inadequate for assessing PA pressure.  3. Left atrial size was mildly dilated.  4. The mitral valve is grossly normal. Mild mitral valve regurgitation.  No evidence of mitral stenosis.  5. The aortic valve was not well visualized. Aortic valve regurgitation  is not visualized. Echo findings are consistent with normal structure and  function of the aortic valve prosthesis.  6. Pulmonic valve regurgitation not well assessed.  Patient Profile     69 y.o. 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   Assessment & Plan    1. Sustained VT/tachyarrhythmias: Since admission, patient has had SVT and WCT.  He required early admission DCCV and  synchronized defibrillation x3.  IV amiodarone discontinued due to concern for QT prolongation and transition to lidocaine infusion.    EP subsequently evaluated the patient and recommended resumption of amiodarone.  Lidocaine was subsequently discontinued.  He underwent LHC with nonobstructive CAD.   Continue IV amiodarone per EP.    He will need transition to oral amiodarone before discharge.  Consider transition today or tomorrow then oral amiodarone 400 mg twice daily until achieves 10 g  load completion.  Maintain potassium goal 4.0, magnesium goal 2.0.    Hypokalemia noted.  Replete with goal 4.0.  Daily BMET.   Will need formal EP evaluation for AICD.    At this time, he remains on IV antibiotics with uptrending white count and not ideal candidate.  Recommend LifeVest at discharge if prolonged IV antibiotics needed after discharge.    Schedule for follow-up with EP in our office at discharge for evaluation/formal consideration of ICD placement.   2. Acute on chronic systolic heart failure:  LVEF <88%. LHC without obstructive CAD.   Continue IV lasix 20mg  daily.   Euvolemic on exam this AM.  I/Os, daily weights.   Daily BMET  Maintain slightly negative fluid balance and given current renal function stable.  Recommend schedule with EP at discharge to evaluate as OP for AICD placement as above for secondary prevention.  Will need discharge with LifeVest.  Schedule with EP at discharge.  Escalation of GDMT as tolerated.  Started on Entresto 24/26.  Consider escalation of dose today given stable Cr and room in BP.  Continue low-dose Coreg and titrate as needed for HR/BP control.  3. Hypokalemia  K 3.5. Replete with goal 4.0.  4. Coronary artery disease s/p non-STEMI s/p PCI/DES to mid RCA (2019)  High-sensitivity troponin minimally elevated and flat trending, thought 2/2 supply demand ischemia in the setting of his comorbid conditions including sustained VT, respiratory failure s/p COVID-19 infection and pna, and acute on chronic systolic heart failure as above.  LHC without obstructive CAD and patent stents.  Escalation of GDMT recommended as tolerated.  Continue ASA, carvedilol, Lipitor.  4. Paroxysmal atrial fibrillation   Maintaining NSR.    Continue BB as tolerated by BP.  Continue anticoagulation with IV heparin as H&H tolerates.   Transition back to Eliquis prior to discharge.  Continue amiodarone.   Transition to oral before  discharge.  5. Aortic stenosis s/p AVR  Previous echo as above. History of aortic insufficiency as above on most recent echo.  Continue conservative management.  6. Leukocytosis  Continue antibiotics.  Per critical care.  7. Pneumonia, COVID-19 infection, positive 11/2019  History of COVID-19 infection.  8. Tobacco and marijuana use  Recommend complete cessation.  Nicotine patch as needed.  For questions or updates, please contact [facility] Please consult [email or web address] for contact info under        Signed, Lennon Alstrom, PA-C  01/29/2020, 8:15 AM

## 2020-01-29 NOTE — Progress Notes (Signed)
PT Cancellation Note  Patient Details Name: Travis Maxwell MRN: [id number] DOB: 10/08/1950   Cancelled Treatment:    Reason Eval/Treat Not Completed: Fatigue/lethargy limiting ability to participate (Consult received and chart reviewed.  Patient just finishing OT evaluation; too fatigued for additional activity.  Will re-attempt at later time/date as medically appropriate and available.)   Tynisa Vohs H. Manson Passey, PT, DPT, NCS 01/29/20, 9:52 AM [phone]

## 2020-01-29 NOTE — Progress Notes (Signed)
PHARMACY CONSULT NOTE  Pharmacy Consult for Electrolyte Monitoring and Replacement   Recent Labs: Potassium (mmol/L)  Date Value  01/29/2020 3.5   Magnesium (mg/dL)  Date Value  24/46/2863 1.6 (L)   Calcium (mg/dL)  Date Value  81/77/1165 8.3 (L)   Albumin (g/dL)  Date Value  79/05/8331 2.5 (L)   Phosphorus (mg/dL)  Date Value  83/29/1916 2.6   Sodium (mmol/L)  Date Value  01/29/2020 137   Assessment: 69 year old male transferred to ICU for acute toxic metabolic encephalopathy. Patient now more alert and awake. Overnight 11/10, patient with two runs pulseless VT, with second episode requiring CPR and defibrillation. Converted back into baseline bundle branch rhythm with rate in 50's-60's.  Pt received magnesium 2 gm during episode. Similar episodes 11/11 requiring defibrillation. Pharmacy to manage electrolytes.  Patient intubated 11/14 and extubated 11/20. Patient dec'd from Bi-PAP to [place] on 2L O2sat 93%. Unable to be evaluated for bedside swallow evaluation at this time. Per chart review, good UOP. Appears to be having diarrhea / frequent bowel movements.    Diuretics: IV Lasix 20 mg daily.   Goal of Therapy:  K > 4 Mg > 2 All other electrolytes within normal limits  Plan:  --Potassium 3.7 this AM to 3.5 this despite receiving PO . Will order PO KCl q2hr x2 today given enteral access. Phos 2.1 to 2.6 today. No further repletion. Mg 1.6 yester, but NNL since last given 4g x1 yesterday; will give 2g x1 today and assess with lab tomorrow.  Ca corrected WNL. --Follow-up electrolytes with AM labs  Martyn Malay  01/29/2020 7:38 AM

## 2020-01-29 NOTE — Progress Notes (Signed)
Speech Language Pathology Treatment: Dysphagia  Patient Details Name: Akshar Starnes MRN: [id number] DOB: 06/20/1950 Today's Date: 01/29/2020 Time: 7014-1030 SLP Time Calculation (min) (ACUTE ONLY): 15 min  Assessment / Plan / Recommendation Clinical Impression  Skilled ST services focused on clinical observations. Patient presenting upright in bed with eyes closed, awoke upon SLP arrival. Noted clear secretions in oral cavity upon entry requiring oral suctioning to remove. Attempted to provide patient with PO snacks today, however patient declined food/drinks in several opportunities. Given max encouragement and feeding assistance, patient did consume x2-3 sips of thin liquids via straw without anterior oral spillage and/or coughing/choking episodes. Barriers to today's treatment session include fluctuating alertness and impaired cognitive status, impacting patient's ability to fully participate in today's session and follow commands. At this time, SLP recommends to continue current dysphagia 2 (fine chop) and thin liquid diet given feeding assistance following all aspiration precautions from evaluating SLP. MD/RN to notify SLP if changes/concerns in swallowing function occur during hospital stay. SLP to continue to monitor diet tolerance and upgrade as appropriate. Patient left upright in bed with call bell in reach and all needs met prior to exiting room.        SLP Plan  Continue with current plan of care       Recommendations  Diet recommendations: Dysphagia 2 (fine chop);Thin liquid Liquids provided via: Straw Medication Administration: Whole meds with puree Supervision: Full supervision/cueing for compensatory strategies Compensations: Minimize environmental distractions;Slow rate;Small sips/bites Postural Changes and/or Swallow Maneuvers: Seated upright 90 degrees                Oral Care Recommendations: Oral care BID SLP Visit Diagnosis: Dysphagia, oral phase  (R13.11) Plan: Continue with current plan of care       GO              Loni Beckwith, M.S. CCC-SLP Speech-Language Pathologist   Loni Beckwith 01/29/2020, 12:28 PM

## 2020-01-29 NOTE — Progress Notes (Signed)
CRITICAL CARE NOTE  [name] presenting to the ED on 01/06/2020 in respiratory distress, complaining of chest pain and shortness of breath. He also reported orthopnea and increased lower extremity swelling. History was limited due to acuity of condition and respiratory status. Patient was placed on biPAP, but progressively worsened and was emergently intubated. Patient is a current 0.5ppd smoker and uses marijuana. He has a severe metabolic acidosis. Patient has a past medical history significant for HTN, T2DM, HLD, GERD, CAD (s/p PCI), AVR with bioprosthetic valve, systolic CHF with EF of 70%, and paroxysmal A. Fib, currently anticoagulated on Eliquis. EKG upon arrival to ED revealed sinus rhythm at a rate of 99bpm with RBBB and PVCs with ST elevation in II, III, avF, V4-V6. Cardiologist on call stated that STEMI criteria were not met and advised to manage CHF. Patient received IV diltiazem and reported improvement of symptoms. Labs were notable for high sensitivity troponin 71->91->89. BNP 1046, creatinine 1.26, BUN 21, potassium 5.4. CXR showed improved pulmonary edema since prior study with chronic pulmonary venous hypertension. Notably, patient was admitted to [facility] ED 12/04/2019 for acute respiratory failure and acute on chronic systolic CHF in the setting of COVID pneumonia.   SIGNIFICANT EVENTS  11/02 - severe cardiogenic shock, remains on vent 11/03 - severe respiratory failure, severe systolic CHF 26/37 - patient remains critically ill 11/05 - patient remains critically ill 11/06 - off pressors, extubated to biPAP 11/07 - remains extubated, febrile, MSSA+ tracheal culture - started ancef 11/08 - remains extubated, on 2L by nasal cannula. Afebrile today with white count of 21.3K, up from 15.7K yesterday. Still encephalopathic and drowsy. On and off Precedex. Went into narrow complex SVT,then progressed to wide complex tachycardiawas shocked and given a loading dose of amiodarone. Will  remain on amiodarone gtt.Cardiology at bedside.  11/09 - remains extubated, on 2L by nasal cannula. Afebrile today with white count of 19.2K, down since yesterday. BP soft overnight, was started on Neo-synephrine infusion. More alert than yesterday, but only oriented to self. Failed Yale 3oz swallow study early this morning, started coughing at the end of the study. Will need to remain NPO until cleared by SLP. Patient has been educated on this, yet continues to incessantly ask for water.  11/10 - patient clinically improved. He is alert communicative and in no distress. He ate entire meal without issues. Still has confusion intermittently during interview. Frequent PVCs on telemetry.  Will initiate OT/PT today for downgrade planning. 11/11 - patient minimally symptomatic, he again had episodes of Torsades today s/p 120Jx1 mag 2g.  Poor prognosis overall with advanced CHF.  11/12 - Dicussed case with cardiology. Antiarrythmic agents being modified. Will also call consult for Electrophysiology  11/13 - Patient with no arrythmia overnight. He remains on precedex and neosynephrine.  I spoke with cardiology Dr Rayann Heman regarding EP evaluation- he recommended to continue with current medical therapy and have cardiologist call to EP on Monday to review options.  He has 1:1 sitter and is resting in bed comfortably. Spoke with Velva Harman POA daughter to update on poor prognosis worsening clinical condition.  11/14 - further deterioration overnight.  Patient required reintubation and MV.  Updated next of kin daughter Velva Harman this am and sent message to cardiology to discuss poor prognosis with family.    11/15 - remains intubated and sedated, high risk for cardiac arrest and death February 18, 2023 - remains intubated and sedated, frequent PVCs overnight, 2g magnesium given, awaiting EP consult, patient becomes tachypneic and diaphoretic during  SBTs 11/17 - increased ectopy, seen by EP who advised weaning lidocaine and restarting  amiodarone, planning for right & left heart cath on Friday 11/19 with Dr. Fletcher Anon, discontinue Eliquis, start IV heparin interim, tracheal culture 11/14 positive for MRSA and pseudomonas, started cefepime and linezolid 11/18 - remains intubated and sedated, on continuous infusion of amiodarone and lidocaine, metoprolol discontinued by cardiology due to low blood pressures, right/left heart cath scheduled for tomorrow 11/19 - pt s/p L&RHC no obstructive lesions, RHC with normal pressures. Patient will be treated medically with transition to PO amio and plan for SBT with attempt to liberate.  11/20 - Patient was liberated from ventilator.  He had persistent VT post extubation, was fortunate to have cardiologist present on time and had immediate medical intervention with improvement. Still very poor prognosis long term. Spoke to granddaugther at bedside requested family meeting to re-address goals of care due to limited therapy available for patient.  11/21 - Patient was able to stay off ventilator last night. He was started on Precedex overnight and is currently sedated to RASS-1 on nasal canula, plan to wean down/off sedation.  11/22 - Patient sitting up in bed, no obvious distress on BiPAP, no acute events overnight, attempt transition to nasal cannula, on NTG drip for BP control per cardiology until PO meds can be taken 11/23 - Patient required Haldol and Valium overnight for agitation, on 2L by [place] today, passed bedside swallow study this morning, can transition to PO meds, Foley removed 11/24 - Patient remains extubated and off pressors, breathing comfortably on room air, antibiotics completed yesterday, PT/OT today, fluctuating alertness, transitioned from IV amiodarone and heparin to PO amiodarone and Eliquis   CC  Follow up respiratory failure  SUBJECTIVE Chronic ill appearing Prognosis is guarded Confused, speech following Cardiology following   BP (!) 147/72   Pulse 83   Temp 98.6 F (37  C) (Oral)   Resp (!) 28   Ht 5' 7.01" (1.702 m)   Wt 71.9 kg   SpO2 94%   BMI 24.82 kg/m    I/O last 3 completed shifts: In: 2815.5 [P.O.:500; I.V.:914.5; IV Piggyback:1401.1] Out: 1075 [Urine:1075] No intake/output data recorded.  SpO2: 94 % O2 Flow Rate (L/min): 2 L/min FiO2 (%): 65 %  Estimated body mass index is 24.82 kg/m as calculated from the following:   Height as of this encounter: 5' 7.01" (1.702 m).   Weight as of this encounter: 71.9 kg.   REVIEW OF SYSTEMS  ROS limited due toseverecritical illness. Respiratory:positive for intermittent cough,negative for shortness of breath Cardiovascular:negative for palpitations, chest pain, lower extremity swelling Gastrointestinal:negative for abdominal pain, nausea/vomiting Neurological:negative for lightheadedness, syncope, headaches  Pressure Injury 01/20/20 Sacrum Posterior;Medial Stage 2 -  Partial thickness loss of dermis presenting as a shallow open injury with a red, pink wound bed without slough. (Active)  01/20/20 0800  Location: Sacrum  Location Orientation: Posterior;Medial  Staging: Stage 2 -  Partial thickness loss of dermis presenting as a shallow open injury with a red, pink wound bed without slough.  Wound Description (Comments):   Present on Admission: No    PHYSICAL EXAMINATION:  GENERAL: Chronically ill appearing, NAD, conversational, difficult to understand HEAD: Normocephalic, atraumatic.  EYES: Pupils equal, round, reactive to light.  No scleral icterus.  MOUTH: Moist mucosal membrane. NECK: Supple. No JVD. PULMONARY: Mildly tachypneic, unlabored, coarse breath sounds bilaterally CARDIOVASCULAR: S1 and S2. Regular rate and rhythm. No murmurs, rubs, or gallops.  GASTROINTESTINAL: Soft, nontender, non-distended.  Positive  bowel sounds.   MUSCULOSKELETAL: No swelling, clubbing, or edema.  NEUROLOGIC: A&O x 1, follows simple commands, confused GCS<10 SKIN: Intact, warm,  dry  MEDICATIONS: I have reviewed all medications and confirmed regimen as documented Scheduled Meds: . allopurinol  300 mg Oral Daily  . aspirin  81 mg Oral Daily  . atorvastatin  40 mg Oral QHS  . carvedilol  3.125 mg Oral BID WC  . chlorhexidine  15 mL Mouth Rinse BID  . Chlorhexidine Gluconate Cloth  6 each Topical Daily  . docusate  100 mg Oral BID  . feeding supplement  237 mL Oral TID BM  . fentaNYL (SUBLIMAZE) injection  25 mcg Intravenous Once  . furosemide  20 mg Intravenous Daily  . insulin aspart  0-9 Units Subcutaneous Q4H  . mouth rinse  15 mL Mouth Rinse q12n4p  . OLANZapine  2.5 mg Oral QHS  . polyethylene glycol  17 g Oral Daily  . potassium & sodium phosphates  1 packet Oral TID AC & HS  . potassium chloride  40 mEq Oral Q2H  . sacubitril-valsartan  1 tablet Oral BID  . scopolamine  1 patch Transdermal Q72H  . senna-docusate  1 tablet Oral BID  . sodium chloride flush  10-40 mL Intracatheter Q12H  . sodium chloride flush  3 mL Intravenous Q12H   Continuous Infusions: . sodium chloride Stopped (01/26/20 0204)  . amiodarone 30 mg/hr (01/29/20 0400)  . dexmedetomidine (PRECEDEX) IV infusion Stopped (01/26/20 1518)  . famotidine (PEPCID) IV Stopped (01/28/20 0916)  . heparin 1,200 Units/hr (01/29/20 0555)  . magnesium sulfate bolus IVPB 2 g (01/29/20 0807)  . nitroGLYCERIN Stopped (01/27/20 1741)   PRN Meds:.sodium chloride, acetaminophen, albuterol, diazepam, haloperidol lactate, metoprolol tartrate, ondansetron (ZOFRAN) IV, oxyCODONE, polyethylene glycol   CULTURE RESULTS   Recent Results (from the past 240 hour(s))  Culture, respiratory     Status: None   Collection Time: 01/19/20  3:55 PM   Specimen: Tracheal Aspirate; Respiratory  Result Value Ref Range Status   Specimen Description   Final    TRACHEAL ASPIRATE Performed at [facility], [address] [place], [place] [address]    Special Requests   Final    NONE Performed at [facility], [address] [place], [place] [address]    Gram Stain   Final    MODERATE WBC PRESENT, PREDOMINANTLY PMN ABUNDANT GRAM POSITIVE COCCI IN PAIRS IN CLUSTERS ABUNDANT GRAM NEGATIVE RODS Performed at [facility], [place] [address] [place], [place] [address]    Culture   Final    ABUNDANT METHICILLIN RESISTANT STAPHYLOCOCCUS AUREUS MODERATE PSEUDOMONAS AERUGINOSA    Report Status 01/22/2020 FINAL  Final   Organism ID, Bacteria METHICILLIN RESISTANT STAPHYLOCOCCUS AUREUS  Final   Organism ID, Bacteria PSEUDOMONAS AERUGINOSA  Final      Susceptibility   Methicillin resistant staphylococcus aureus - MIC*    CIPROFLOXACIN <=0.5 SENSITIVE Sensitive     ERYTHROMYCIN >=8 RESISTANT Resistant     GENTAMICIN <=0.5 SENSITIVE Sensitive     OXACILLIN >=4 RESISTANT Resistant     TETRACYCLINE 2 SENSITIVE Sensitive     VANCOMYCIN 1 SENSITIVE Sensitive     TRIMETH/SULFA <=10 SENSITIVE Sensitive     CLINDAMYCIN 1 INTERMEDIATE Intermediate     RIFAMPIN 1 SENSITIVE Sensitive     Inducible Clindamycin NEGATIVE Sensitive     * ABUNDANT METHICILLIN RESISTANT STAPHYLOCOCCUS AUREUS   Pseudomonas aeruginosa - MIC*    CEFTAZIDIME 2 SENSITIVE Sensitive  CIPROFLOXACIN <=0.25 SENSITIVE Sensitive     GENTAMICIN <=1 SENSITIVE Sensitive     IMIPENEM 1 SENSITIVE Sensitive     PIP/TAZO <=4 SENSITIVE Sensitive     CEFEPIME 2 SENSITIVE Sensitive     * MODERATE PSEUDOMONAS AERUGINOSA  Expectorated sputum assessment w rflx to resp cult     Status: None   Collection Time: 01/26/20 11:39 PM   Specimen: Expectorated Sputum  Result Value Ref Range Status   Specimen Description EXPECTORATED SPUTUM  Final   Special Requests NONE  Final   Sputum evaluation   Final    THIS SPECIMEN IS ACCEPTABLE FOR SPUTUM CULTURE Performed at [facility], [address], [place], [place] [address]    Report Status 01/27/2020 FINAL  Final  Culture, respiratory     Status: None (Preliminary result)    Collection Time: 01/26/20 11:39 PM  Result Value Ref Range Status   Specimen Description   Final    EXPECTORATED SPUTUM Performed at [facility], [address] [place], [place] [address]    Special Requests   Final    NONE Reflexed from [id number] Performed at [facility], [address] [place], [place] [address]    Gram Stain   Final    MODERATE SQUAMOUS EPITHELIAL CELLS PRESENT FEW WBC PRESENT, PREDOMINANTLY PMN RARE GRAM POSITIVE COCCI RARE GRAM NEGATIVE RODS    Culture   Final    NO GROWTH 1 DAY Performed at [facility], [place] [address], [place], [place] [address]    Report Status PENDING  Incomplete    LABS  CBC Latest Ref Rng & Units 01/29/2020 01/28/2020 01/27/2020  WBC 4.0 - 10.5 K/uL 16.9(H) 19.3(H) 15.4(H)  Hemoglobin 13.0 - 17.0 g/dL 14.8 14.2 14.9  Hematocrit 39 - 52 % 47.3 45.0 47.7  Platelets 150 - 400 K/uL 985(HH) 972(HH) 1,005(HH)   BMP Latest Ref Rng & Units 01/29/2020 01/28/2020 01/27/2020  Glucose 70 - 99 mg/dL 139(H) 141(H) -  BUN 8 - 23 mg/dL 11 15 -  Creatinine 0.61 - 1.24 mg/dL 0.77 0.85 -  Sodium 135 - 145 mmol/L 137 137 -  Potassium 3.5 - 5.1 mmol/L 3.5 3.7 3.6  Chloride 98 - 111 mmol/L 106 107 -  CO2 22 - 32 mmol/L 19(L) 18(L) -  Calcium 8.9 - 10.3 mg/dL 8.3(L) 8.7(L) -    Intake/Output Summary (Last 24 hours) at 01/29/2020 0859 Last data filed at 01/29/2020 0400 Gross per 24 hour  Intake 1658.75 ml  Output 575 ml  Net 1083.75 ml    IMAGING    No results found.  SIGNIFICANT DIAGNOSTIC TESTS  01/21/20 - Echocardiogram: LVEF 20 to 25% with severely decreased function and severe global hypokinesis. Left ventricle moderately dilated with mild left ventricular hypertrophy.Grade 1 diastolic dysfunction and elevated left atrial pressure.Left atrium mildly dilated mild mitral valve regurgitation. 01/24/20 - Right/Left Heart Cath: right heart catheterization showed mildly elevated filling pressures, minimal  pulmonary hypertension and normal cardiac output previously placed proximal RCA to mid RCA stent is widely patent.No significant coronary artery disease overall.   Nutrition Status: Nutrition Problem: Moderate Malnutrition Etiology: chronic illness (CHF) Signs/Symptoms: moderate fat depletion, moderate muscle depletion Interventions: Tube feeding, MVI       Central Line/ continued, requirement due to  Reason to continue [employer] Monitoring of central venous pressure or other hemodynamic parameters and poor IV access     ASSESSMENT AND PLAN SYNOPSIS [name] with severe respiratory failure due to metabolic acidosis  with severe systolic CHF exacerbation, previous history of COVID-19 in the setting of drug abuse, undergoing therapy for HCAP, complicated by tachyarrhythmia and progressive heart failure. Patient with recurrent respiratory failure requiring intubation. Remains extubated since 11/20.  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 Fletcher Anon, discontinue Eliquis, initiate IV heparin interim - R&LHC showed non-obstructive disease with patent stents, may need AICD for secondary prevention - Passed swallow study 11/23 - resume PO cardiac meds  - Cardiology following - appreciate input - Per cardiology will need LifeVest at discharge with f/u EP outpatient for AICD evaluation  SHOCK-CARDIOGENIC due to tachyarrhythmias - S/p electrical  cardioversion - Use vasopressors as needed to keep MAP > 65 - IV amiodarone discontinued due to torsades --> reassessed by EP 11/16, no past evidence of torsades - Transitioned from IV lidocaine back to IV amiodarone per EP Dr. Quentin Ore - Weaned off lidocaine after cardiac catheterization 11/19 - Per cardiology: maintain potassium > 4.0 and magnesium > 2.0 - Passed swallow study 11/23 - still on IV amiodarone and heparin - Transition back to PO amiodarone and Eliquis prior to discharge  ACUTE KIDNEY INJURY/Renal Failure - Continue Foley Catheter - assess need - Avoid nephrotoxic agents - Follow urine output, BMP - Ensure adequate renal perfusion, optimize oxygenation - Renal dose medications  NEUROLOGY - Acute metabolic encephalopathy, need for sedation - Continue safety sitter - Previously requiring precedex drip, haldol, diazepam PRN for agitation/anxiety - Start scheduled Zyprexa daily at bedtime  CARDIAC - ICU monitoring - Telemetry showing sinus rhythm with PVCs  ID  - Tracheal culture 11/14 positive for MRSA and pseudomonas - Repeat culture 11/21 showed no growth - Antibiotic course completed 11/23  GI - GI PROPHYLAXIS as indicated - DIET --> Dysphagia 2 (fine chop); thin liquid - Constipation protocol as indicated  ENDO - Will use ICU hypoglycemic\Hyperglycemia protocol if indicated  ELECTROLYTES - Follow labs as needed - Replace as needed - Pharmacy consultation and following   DVT/GI PRX ordered and assessed TRANSFUSIONS AS NEEDED MONITOR FSBS I Assessed the need for Labs I Assessed the need for Foley I Assessed the need for Central Venous Line Family Discussion when available I Assessed the need for Mobilization I made an Assessment of medications to be adjusted accordingly Safety Risk assessment completed   CASE DISCUSSED IN MULTIDISCIPLINARY ROUNDS WITH ICU TEAM  prognosis is guarded, prognosis is poor.    Corrin Parker, M.D.  [facility]  Pulmonary & Critical Care Medicine  Medical Director [place] Director [facility] Cardio-Pulmonary Department

## 2020-01-29 NOTE — Progress Notes (Signed)
ANTICOAGULATION CONSULT NOTE   Pharmacy Consult for heparin Indication: atrial fibrillation  Allergies  Allergen Reactions  . Penicillins Other (See Comments)    Has patient had a PCN reaction causing immediate rash, facial/tongue/throat swelling, SOB or lightheadedness with hypotension: Unknown Has patient had a PCN reaction causing severe rash involving mucus membranes or skin necrosis: Unknown Has patient had a PCN reaction that required hospitalization: Unknown Has patient had a PCN reaction occurring within the last 10 years: No If all of the above answers are "NO", then may proceed with Cephalosporin use.     Patient Measurements: Height: 5' 7.01" (170.2 cm) Weight: 71.9 kg (158 lb 8.2 oz) IBW/kg (Calculated) : 66.12 Heparin Dosing Weight: 67 kg  Vital Signs: Temp: 98.8 F (37.1 C) (11/24 0400) Temp Source: Oral (11/24 0400) BP: 112/88 (11/24 0500) Pulse Rate: 42 (11/24 0500)  Labs: Recent Labs    01/27/20 0417 01/27/20 0417 01/28/20 0402 01/29/20 0351  HGB 14.9   < > 14.2 14.8  HCT 47.7  --  45.0 47.3  PLT 1,005*  --  972* 985*  APTT 69*  --   --   --   HEPARINUNFRC 0.46  --  0.35 0.23*  CREATININE 0.88  --  0.85 0.77   < > = values in this interval not displayed.    Estimated Creatinine Clearance: 83.8 mL/min (by C-G formula based on SCr of 0.77 mg/dL).   Medical History: Past Medical History:  Diagnosis Date  . CAD (coronary artery disease)   . Chronic systolic (congestive) heart failure (HCC)   . Diabetes mellitus without complication (HCC)   . GERD (gastroesophageal reflux disease)   . Gout   . Hx of aortic valve replacement    Bioprosthetic valve  . Hypertension       Assessment: 69 year old male with h/o CAD, HFrEF, aortic regurgitation and stenosis s/p bioprosthetic aortic valve, afib on Eliquis. Patient with episodes of ventricular tachycardia requiring defibrillation multiple times. Patient has been on Eliquis here. Plan is for heart  cath Friday after appropriate Eliquis washout. Plan to start heparin in interim.  11/17 0414 aPTT 65 11/17 1610 aPTT 53 11/18 0138 aPTT 151 11/18 0946 aPTT 76, HL 0.98  11/18 1617 aPTT 89 11/19 0425 aPTT 76, HL 0.88  11/20 0415 aPTT 78, HL 0.79  11/21 0509 aPTT 76, HL 0.69   Goal of Therapy:  Heparin level 0.3-0.7 units/ml aPTT 66-102 seconds Monitor platelets by anticoagulation protocol: Yes   Plan:  11/20 @ 0415:  APTT = 78,  HL = 0.79 Will continue pt on current rate and recheck aPTT and HL on 11/21 AM labs.   11/21 @ 0509:  APTT = 76, HL = 0.69 APTT and HL both within therapeutic range now.  Will use HL to guide dosing from here on. Will recheck HL on 11/22 with AM labs.   11/22 @ 0417:  APTT = 69, HL = 0.46 APTT and HL are both therapeutic, both in lower range of goal. Will recheck HL on 11/23 with AM labs.   11/23 @ 0402 HL 0.35, therapeutic.  Continue current rate and recheck in am. CBC stable.   11/24 @ 0351 HL 0.23, subtherapeutic. No problems w/ infusion per RN.  Will increase Heparin infusion to 1200 units/hr and recheck in 6 hrs  Valrie Hart A 01/29/2020 5:53 AM

## 2020-01-29 NOTE — Evaluation (Signed)
Physical Therapy Evaluation Patient Details Name: Sufian Ravi MRN: [id number] DOB: May 11, 1950 Today's Date: 01/29/2020   History of Present Illness  presented to ER secondary to CP, SOB; admitted for mangement of severe acute hypoxic and hypercapnic respiratory failure, acute toxic metabolic encephalopathy and cardiogenic shock.  Hospital course significacnt for intubation 11/1-11/6, 11/14-11/20, currently on RA; L/R heart cath (11/19) with no obstuctive lesions noted, EF approx 20%.  Recommended for lifevest at DC with further evaluation for AICD placement..  Clinical Impression  Patient RN/CNA at bedside for patient care upon arrival to session; received in supine and agreeable to participation with session.  Patient oriented to self only; globally confused with limited ability to follow purposeful commands.  Generally restless at times, fidgeting with lines, sheets throughout session.  Appears to maintain cervical rotation, visual attention to far L of midline (esp with transition to unsupported sitting); marked difficulty moving to midline or beyond.  Question active use of R hemi-body, but difficulty to fully assess due to noted cognitive deficits. Does spontaneously turn head to R or actively mobilize R UE/LE, bt with limited range and generalized weakness.  Currently requiring mod/max assist for bed mobility; min to close sup for unsupported sitting balance; max assist +1 for sit/stand and static standing balance.  Heavy L lateral lean, limited active use/WBing of R LE in standing. Unable to process, sequence of initiate stepping or OOB activities beyond static standing at this time.  Will continue to assess/progress as appropriate. Would benefit from skilled PT to address above deficits and promote optimal return to PLOF.; recommend transition to STR upon discharge from acute hospitalization.   Follow Up Recommendations SNF     Equipment Recommendations       Recommendations for Other  Services       Precautions / Restrictions Precautions Precautions: Fall Restrictions Weight Bearing Restrictions: No      Mobility  Bed Mobility Overal bed mobility: Needs Assistance Bed Mobility: Supine to Sit;Sit to Supine     Supine to sit: Mod assist;Max assist Sit to supine: Max assist   General bed mobility comments: poor initiation of extremity movement, poor task sequencing    Transfers Overall transfer level: Needs assistance   Transfers: Sit to/from Stand Sit to Stand: Mod assist;Max assist         General transfer comment: poor active use of R LE; physical assist for R hip/knee stability and WBing  in closed-chain position.  Heavy L lateral lean in standing, generally resistive to facilitation towards midline  Ambulation/Gait             General Gait Details: unsafe/unable  Stairs            Wheelchair Mobility    Modified Rankin (Stroke Patients Only)       Balance Overall balance assessment: Needs assistance Sitting-balance support: No upper extremity supported;Feet supported Sitting balance-Leahy Scale: Fair Sitting balance - Comments: min/mod assist for initial sitting balance; improves to close sup with accommodation to position.  Limited ability to follow commands for participation with dynamic tasks in sitting   Standing balance support: Single extremity supported Standing balance-Leahy Scale: Zero Standing balance comment: heavy L Lateral lean; absent awareness of midline, absent attempts at self-correction                             Pertinent Vitals/Pain Pain Assessment: Faces Faces Pain Scale: No hurt    Home Living  Additional Comments: Patient poor historian; unable to provide reliable history.  Will verify with family as available.  Previous documentation reports patient/wife living in [place].    Prior Function Level of Independence: Independent         Comments: Patient poor  historian; unable to provide reliable history.  Will verify with family as available.  Previous documentation reports patient indep with mobility, ADLs     Hand Dominance        Extremity/Trunk Assessment   Upper Extremity Assessment Upper Extremity Assessment: Generalized weakness (grossly at least 3-/5 throughout; limited attention to isolated muscle activation/formal MMT)    Lower Extremity Assessment Lower Extremity Assessment: Generalized weakness (grossly at least 3-/5 throughout; limited attention to isolated muscle activation/formal MMT)       Communication   Communication:  (generally hypophonic, mumbled in speech)  Cognition Arousal/Alertness: Awake/alert Behavior During Therapy: Restless                                   General Comments: oriented to self only;limited ability to follow commands, often requiring hand-over-hand assist for participation with functional tasks; generally restless and fidgeting with lines, sheets thorughout session  Poor processing, task initiation and overall sequencing of functional activities.      General Comments      Exercises Other Exercises Other Exercises: Unsupported sitting, min progressing to close sup for static balance--worked to imrprove postural extension, cervical rotation/visual scanning and attention to R of midline.  Tends ot maintain cervical rotaiton and visual attention to L; max cuing for movement to midline.  Does very minimaly spontaneously turn head to R, but unable to facilitate on command. Other Exercises: Sit/stand x2, mod/max assist +1 for lift off, midline orientation, overall balance Other Exercises: Attempted transfer towards chair, squat pviot-patient generally resistive to weight shift and attempts at stepping.  Requiring return to bed for safety Other Exercises: Moved to chair position in bed to facilitate repositioning, tolerance to upright and pulmonary hygiene.  Positioned to comfort, UEs  elevated on pillows.  Tolerating position well; more alert and engaged with environemnt in upright position.   Assessment/Plan    PT Assessment Patient needs continued PT services  PT Problem List Decreased strength;Decreased range of motion;Decreased activity tolerance;Decreased balance;Decreased mobility;Decreased coordination;Decreased cognition;Decreased knowledge of use of DME;Decreased safety awareness;Decreased knowledge of precautions;Cardiopulmonary status limiting activity       PT Treatment Interventions DME instruction;Balance training;Gait training;Neuromuscular re-education;Stair training;Cognitive remediation;Patient/family education;Functional mobility training;Therapeutic activities;Therapeutic exercise    PT Goals (Current goals can be found in the Care Plan section)  Acute Rehab PT Goals Patient Stated Goal: none stated PT Goal Formulation: Patient unable to participate in goal setting Time For Goal Achievement: 02/12/20 Potential to Achieve Goals: Fair    Frequency Min 2X/week   Barriers to discharge        Co-evaluation               AM-PAC PT "6 Clicks" Mobility  Outcome Measure Help needed turning from your back to your side while in a flat bed without using bedrails?: A Lot Help needed moving from lying on your back to sitting on the side of a flat bed without using bedrails?: A Lot Help needed moving to and from a bed to a chair (including a wheelchair)?: Total Help needed standing up from a chair using your arms (e.g., wheelchair or bedside chair)?: Total Help needed to walk in hospital  room?: Total Help needed climbing 3-5 steps with a railing? : Total 6 Click Score: 8    End of Session   Activity Tolerance: Patient tolerated treatment well Patient left: in bed;with bed alarm set;with call bell/phone within reach Nurse Communication: Mobility status PT Visit Diagnosis: Muscle weakness (generalized) (M62.81);Difficulty in walking, not elsewhere  classified (R26.2);Other symptoms and signs involving the nervous system (R29.898)    Time: 4259-5638 PT Time Calculation (min) (ACUTE ONLY): 31 min   Charges:   PT Evaluation $PT Eval High Complexity: 1 High PT Treatments $Therapeutic Activity: 8-22 mins        Yasheka Fossett H. Manson Passey, PT, DPT, NCS 01/29/20, 10:33 PM [phone]

## 2020-01-30 DIAGNOSIS — J9601 Acute respiratory failure with hypoxia: Secondary | ICD-10-CM | POA: Diagnosis not present

## 2020-01-30 DIAGNOSIS — R627 Adult failure to thrive: Secondary | ICD-10-CM | POA: Diagnosis not present

## 2020-01-30 DIAGNOSIS — R410 Disorientation, unspecified: Secondary | ICD-10-CM | POA: Diagnosis not present

## 2020-01-30 DIAGNOSIS — I472 Ventricular tachycardia: Secondary | ICD-10-CM | POA: Diagnosis not present

## 2020-01-30 DIAGNOSIS — I5023 Acute on chronic systolic (congestive) heart failure: Secondary | ICD-10-CM | POA: Diagnosis not present

## 2020-01-30 LAB — CBC WITH DIFFERENTIAL/PLATELET
Abs Immature Granulocytes: 0.12 10*3/uL — ABNORMAL HIGH (ref 0.00–0.07)
Basophils Absolute: 0.3 10*3/uL — ABNORMAL HIGH (ref 0.0–0.1)
Basophils Relative: 2 %
Eosinophils Absolute: 1 10*3/uL — ABNORMAL HIGH (ref 0.0–0.5)
Eosinophils Relative: 6 %
HCT: 48.9 % (ref 39.0–52.0)
Hemoglobin: 15.9 g/dL (ref 13.0–17.0)
Immature Granulocytes: 1 %
Lymphocytes Relative: 18 %
Lymphs Abs: 2.9 10*3/uL (ref 0.7–4.0)
MCH: 21.8 pg — ABNORMAL LOW (ref 26.0–34.0)
MCHC: 32.5 g/dL (ref 30.0–36.0)
MCV: 67 fL — ABNORMAL LOW (ref 80.0–100.0)
Monocytes Absolute: 1.2 10*3/uL — ABNORMAL HIGH (ref 0.1–1.0)
Monocytes Relative: 8 %
Neutro Abs: 10.1 10*3/uL — ABNORMAL HIGH (ref 1.7–7.7)
Neutrophils Relative %: 65 %
Platelets: 979 10*3/uL (ref 150–400)
RBC: 7.3 MIL/uL — ABNORMAL HIGH (ref 4.22–5.81)
RDW: 27.7 % — ABNORMAL HIGH (ref 11.5–15.5)
Smear Review: NORMAL
WBC: 15.6 10*3/uL — ABNORMAL HIGH (ref 4.0–10.5)
nRBC: 0 % (ref 0.0–0.2)

## 2020-01-30 LAB — BASIC METABOLIC PANEL
Anion gap: 9 (ref 5–15)
BUN: 11 mg/dL (ref 8–23)
CO2: 22 mmol/L (ref 22–32)
Calcium: 8.6 mg/dL — ABNORMAL LOW (ref 8.9–10.3)
Chloride: 107 mmol/L (ref 98–111)
Creatinine, Ser: 0.7 mg/dL (ref 0.61–1.24)
GFR, Estimated: >60 mL/min (ref >60)
Glucose, Bld: 149 mg/dL — ABNORMAL HIGH (ref 70–99)
Potassium: 3.7 mmol/L (ref 3.5–5.1)
Sodium: 138 mmol/L (ref 135–145)

## 2020-01-30 LAB — GLUCOSE, CAPILLARY
Glucose-Capillary: 112 mg/dL — ABNORMAL HIGH (ref 70–99)
Glucose-Capillary: 114 mg/dL — ABNORMAL HIGH (ref 70–99)
Glucose-Capillary: 135 mg/dL — ABNORMAL HIGH (ref 70–99)
Glucose-Capillary: 140 mg/dL — ABNORMAL HIGH (ref 70–99)
Glucose-Capillary: 145 mg/dL — ABNORMAL HIGH (ref 70–99)
Glucose-Capillary: 190 mg/dL — ABNORMAL HIGH (ref 70–99)

## 2020-01-30 LAB — MAGNESIUM
Magnesium: 1.6 mg/dL — ABNORMAL LOW (ref 1.7–2.4)
Magnesium: 1.8 mg/dL (ref 1.7–2.4)

## 2020-01-30 LAB — PHOSPHORUS: Phosphorus: 3 mg/dL (ref 2.5–4.6)

## 2020-01-30 LAB — HEPARIN LEVEL (UNFRACTIONATED): Heparin Unfractionated: 1.31 IU/mL — ABNORMAL HIGH (ref 0.30–0.70)

## 2020-01-30 LAB — APTT: aPTT: 64 seconds — ABNORMAL HIGH (ref 24–36)

## 2020-01-30 MED ORDER — DIAZEPAM 5 MG/ML IJ SOLN
INTRAMUSCULAR | Status: AC
  Filled 2020-01-30: qty 2

## 2020-01-30 MED ORDER — AMIODARONE HCL IN DEXTROSE 360-4.14 MG/200ML-% IV SOLN
30.0000 mg/h | INTRAVENOUS | Status: DC
  Administered 2020-01-30 – 2020-02-06 (×13): 30 mg/h via INTRAVENOUS
  Filled 2020-01-30 (×14): qty 200

## 2020-01-30 MED ORDER — LORAZEPAM 2 MG/ML IJ SOLN
2.0000 mg | Freq: Once | INTRAMUSCULAR | Status: AC
  Administered 2020-01-30: 2 mg via INTRAVENOUS
  Filled 2020-01-30: qty 1

## 2020-01-30 MED ORDER — AMIODARONE HCL IN DEXTROSE 360-4.14 MG/200ML-% IV SOLN
INTRAVENOUS | Status: AC
  Administered 2020-01-30: 30 mg/h via INTRAVENOUS
  Filled 2020-01-30: qty 200

## 2020-01-30 MED ORDER — CARVEDILOL 6.25 MG PO TABS
6.2500 mg | ORAL_TABLET | Freq: Two times a day (BID) | ORAL | Status: DC
  Administered 2020-01-30 – 2020-02-19 (×33): 6.25 mg via ORAL
  Filled 2020-01-30 (×38): qty 1

## 2020-01-30 MED ORDER — HEPARIN (PORCINE) 25000 UT/250ML-% IV SOLN
1350.0000 [IU]/h | INTRAVENOUS | Status: AC
  Administered 2020-01-30 – 2020-02-01 (×3): 1200 [IU]/h via INTRAVENOUS
  Filled 2020-01-30 (×6): qty 250

## 2020-01-30 MED ORDER — POTASSIUM CHLORIDE 20 MEQ PO PACK: 40.0000 meq | PACK | Freq: Once | ORAL | Status: DC

## 2020-01-30 MED ORDER — MAGNESIUM SULFATE 2 GM/50ML IV SOLN
2.0000 g | Freq: Once | INTRAVENOUS | Status: AC
  Administered 2020-01-30: 2 g via INTRAVENOUS
  Filled 2020-01-30: qty 50

## 2020-01-30 MED ORDER — POTASSIUM CHLORIDE 20 MEQ PO PACK
40.0000 meq | PACK | Freq: Once | ORAL | Status: AC
  Administered 2020-01-30: 40 meq via ORAL
  Filled 2020-01-30: qty 2

## 2020-01-30 NOTE — Progress Notes (Addendum)
ANTICOAGULATION CONSULT NOTE   Pharmacy Consult for heparin Indication: atrial fibrillation  Allergies  Allergen Reactions  . Penicillins Other (See Comments)    Has patient had a PCN reaction causing immediate rash, facial/tongue/throat swelling, SOB or lightheadedness with hypotension: Unknown Has patient had a PCN reaction causing severe rash involving mucus membranes or skin necrosis: Unknown Has patient had a PCN reaction that required hospitalization: Unknown Has patient had a PCN reaction occurring within the last 10 years: No If all of the above answers are "NO", then may proceed with Cephalosporin use.     Patient Measurements: Height: 5' 7.01" (170.2 cm) Weight: 72 kg (158 lb 11.7 oz) IBW/kg (Calculated) : 66.12 Heparin Dosing Weight: 67 kg  Vital Signs: Temp: 98.9 F (37.2 C) (11/25 0800) Temp Source: Axillary (11/25 0800) BP: 140/104 (11/25 0600) Pulse Rate: 115 (11/25 0600)  Labs: Recent Labs    01/28/20 0402 01/28/20 0402 01/29/20 0351 01/30/20 0400  HGB 14.2   < > 14.8 15.9  HCT 45.0  --  47.3 48.9  PLT 972*  --  985* 979*  HEPARINUNFRC 0.35  --  0.23*  --   CREATININE 0.85  --  0.77 0.70   < > = values in this interval not displayed.    Estimated Creatinine Clearance: 83.8 mL/min (by C-G formula based on SCr of 0.7 mg/dL).   Medical History: Past Medical History:  Diagnosis Date  . CAD (coronary artery disease)   . Chronic systolic (congestive) heart failure (HCC)   . Diabetes mellitus without complication (HCC)   . GERD (gastroesophageal reflux disease)   . Gout   . Hx of aortic valve replacement    Bioprosthetic valve  . Hypertension       Assessment: 69 year old male with h/o CAD, HFrEF, aortic regurgitation and stenosis s/p bioprosthetic aortic valve, afib on Eliquis. Patient with episodes of ventricular tachycardia requiring defibrillation multiple times. Patient has been on Eliquis here. Heart cath 11/19. Plan was started back on  Apixaban but this morning (11/25) pt is unable to take PO and is being placed back on heparin drip. Last apixaban dose was 11/24 @2218 . Patient was previously therapeutic on 1100 units/hr then was subtherapeutic on 11/23 with HL 0.23 and plan was to increase rate to 1200 units/hr.   11/23 @ 0402 HL 0.35, therapeutic 11/24 @ 0351 HL 0.23, subtherapeutic. No problems w/ infusion per RN.  Will increase Heparin infusion to 1200 units/hr and recheck in 6 hrs  Goal of Therapy:  Heparin level 0.3-0.7 units/ml once HL and aPTT begin to correlate and if heparin is still continued aPTT 66-102 seconds Monitor platelets by anticoagulation protocol: Yes   Plan:  Will not bolus as patient was on apixaban with last dose 11/24 @2218 . Will restart heparin at 1200units/hr as per previous plan given previous subtherapeutic HL on 11/24.  Will order aPTT in 6 hours and continue to monitor and adjust dose as clinically indicated If heparin is still continued, will begin to use HL once aPTT and HL begin to correlate.  Monitor CBC per protocol  , PharmD Pharmacy Resident  01/30/2020 11:05 AM

## 2020-01-30 NOTE — Progress Notes (Signed)
Pt agitated and combative unable to redirect, placed order for 2 mg iv ativan x1 dose.  Will continue to monitor and assess pt.   Sonda Rumble, AGNP  Pulmonary/Critical Care Pager [phone] (please enter 7 digits) PCCM Consult Pager [phone] (please enter 7 digits)

## 2020-01-30 NOTE — Progress Notes (Addendum)
PROGRESS NOTE  Travis Maxwell XBM:[id number] DOB: Sep 27, 1950 DOA: 01/06/2020 PCP: [facility]   LOS: 24 days   Brief narrative:  69 years old African-American male with past medical history of hypertension, diabetes mellitus type 2, hyperlipidemia, GERD, coronary artery disease status post PCI, history of aortic valve replacement on Eliquis presented to the hospital with respiratory distress chest pain and shortness of breath on 01/06/2020.  He also had orthopnea and leg swelling.  Initially was put on BiPAP but his breathing status worsened so he was emergently intubated and admitted to the hospital.  Patient was noted to be in severe cardiogenic shock on 01/07/2020.  Vasopressors were weaned off on 11/ 6 and patient was extubated to BiPAP at that time but patient was subsequently intubated on 01/19/2020.Marland Kitchen  Patient also had severe metabolic acidosis.  EKG showed some concerns for ST elevation but did not meet ST elevation MI criteria as per cardiology.  Patient received IV Cardizem and have a borderline elevated troponins.  BNP on admission was 1046.  Patient had a chest x-ray which showed vascular congestion.  Of note patient has history of Covid pneumonia on 12/04/2019.  Patient remained extubated and was off pressors on 01/29/20 and was subsequently considered for transfer to medical floor.  Assessment/Plan:  Active Problems:   Acute on chronic systolic congestive heart failure (HCC)   Respiratory failure (HCC)   Malnutrition of moderate degree   Adult failure to thrive   Palliative care by specialist   DNR (do not resuscitate) discussion   Delirium   Pressure injury of skin   Ventricular tachycardia (HCC)  Acute hypoxic and hypercarbic  respiratory failure. -Patient was intubated and extubated twice, last extubation 11/20.  Has been on nasal cannula since then.  Consider BiPAP as needed.  Continue supplemental oxygen to maintain oxygen saturation more than 90%.  Patient  had MRSA and Pseudomonas pneumonia and completed Zyvox and cefepime.  Antibiotics have been completed 01/28/20.    Acute systolic congestive heart failure.  Nonischemic cardiomyopathy.  Last known ejection fraction of 20 to 25%.  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 Patient has been by cardiology during hospitalization.  Underwent cardiac catheterization on 01/24/20.  Cardiac cath showed nonobstructive disease with patent stents.  Patient might need AICD for secondary prevention prior to discharge.  Follow cardiology input.  Likely need by patient on discharge with outpatient therapy cardiology evaluation for AICD.  Continue IV Lasix, beta-blockers, Entresto  Cardiogenic shock status post ventricular tachycardia Patient underwent electric cardioversion.  Required vasopressors initially.  Off vasopressors at this time.  Continue IV amiodarone drip..  Continue IV heparin drip maintain potassium more than 4 and magnesium 2.0.  Patient had torsades with IV amiodarone.  Continue aspirin, Lipitor, Coreg  Paroxysmal atrial fibrillation.  Continue Coreg, amiodarone, heparin drip.  Adequately replenish electrolytes.  Acute kidney injury.   We will need to continue to monitor BMP.  External Foley catheter at this time.  Creatinine of 0.7 at this time.   Acute metabolic encephalopathy,  Patient required Haldol, diazepam and Precedex drip during ICU.  Continue Recruitment consultant.  Has been started on olanzapine.  Patient received IV sedation this morning and is very somnolent.  Advised against IV benzodiazepines.  MRSA and Pseudomonas pneumonia.  Tracheal culture 11/14 positive for MRSA and pseudomonas. Repeat culture 11/21 showed no growth. Antibiotic course completed 11/23  Dysphagia.  On dysphagia 2 diet.  Very somnolent today.  Mild hypomagnesemia.  Will replace.  Check levels  in a.m.  Pressure injury 01/20/2020.  Sacral stage II ulceration.  Continue wound care. Pressure Injury 01/20/20 Sacrum  Posterior;Medial Stage 2 -  Partial thickness loss of dermis presenting as a shallow open injury with a red, pink wound bed without slough. (Active)  01/20/20 0800  Location: Sacrum  Location Orientation: Posterior;Medial  Staging: Stage 2 -  Partial thickness loss of dermis presenting as a shallow open injury with a red, pink wound bed without slough.  Wound Description (Comments):   Present on Admission: No   Ethics. patient has been seen by palliative care.  Patient wishes to proceed with aggressive care  DVT prophylaxis: SCDs Start: 01/06/20 1332 apixaban (ELIQUIS) tablet 5 mg    Code Status: Full code  Family Communication:  I tried to reach [place], daughter but the phone didn't go through, will try to update in am.   Status is: Inpatient  Remains inpatient appropriate because:Unsafe d/c plan, IV treatments appropriate due to intensity of illness or inability to take PO and Inpatient level of care appropriate due to severity of illness, critically ill patient   Dispo: The patient is from: Home              Anticipated d/c is to: Skilled nursing facility placement as per physical therapy recommendation.              Anticipated d/c date is: 3 days or more              Patient currently is not medically stable to d/c.    Consultants:  PCCM  Cardiology  Procedures:  01/21/20 - Echocardiogram:LVEF 20 to 25% with severely decreased function and severe global hypokinesis. Left ventricle moderately dilated with mild left ventricular hypertrophy.Grade 1 diastolic dysfunction and elevated left atrial pressure.Left atrium mildly dilated mild mitral valve regurgitation.  01/24/20 - Right/Left Heart Cath:right heart catheterization showed mildly elevated filling pressures, minimal pulmonary hypertension and normal cardiac output previously placed proximal RCA to mid RCA stent is widely patent.No significant coronary artery disease overall.  Antibiotics:  . Completed  course  Subjective: Today, patient was seen and examined at bedside.  Patient is very somnolent today.  Was agitated and received IV Ativan.  Minimal information from the patient.  Occasionally saying yes or no to few questions  Objective: Vitals:   01/30/20 0500 01/30/20 0600  BP: (!) 141/100 (!) 140/104  Pulse: 86 (!) 115  Resp: (!) 31 (!) 26  Temp:    SpO2: 97% 100%    Intake/Output Summary (Last 24 hours) at 01/30/2020 0820 Last data filed at 01/30/2020 0600 Gross per 24 hour  Intake 730.09 ml  Output 1350 ml  Net -619.91 ml   Filed Weights   01/28/20 0358 01/29/20 0400 01/30/20 0326  Weight: 71.9 kg 71.9 kg 72 kg   Body mass index is 24.85 kg/m.   Physical Exam:  GENERAL: Patient is somnolent, answering yes or no to few questions, on nasal cannula 2 L/min HENT: No scleral pallor or icterus. Pupils equally reactive to light. Oral mucosa is moist NECK: is supple, no gross swelling noted. CHEST:   Diminished breath sounds bilaterally. CVS: S1 and S2 heard, no murmur. Regular rate and rhythm.  ABDOMEN: Soft, non-tender, bowel sounds are present.  External urinary catheter in place.  Rectal positive EXTREMITIES: No edema.  Right upper extremity PICC line in place. CNS: Moving extremities.  somnolent. SKIN: warm and dry without rashes.  Data Review: I have personally reviewed the following laboratory data  and studies,  CBC: Recent Labs  Lab 01/26/20 2339 01/27/20 0417 01/28/20 0402 01/29/20 0351 01/30/20 0400  WBC 15.7* 15.4* 19.3* 16.9* 15.6*  NEUTROABS 10.8* 11.1* 13.3* 11.1* 10.1*  HGB 14.3 14.9 14.2 14.8 15.9  HCT 46.3 47.7 45.0 47.3 48.9  MCV 67.1* 67.2* 66.4* 66.8* 67.0*  PLT 1,018* 1,005* 972* 985* 979*   Basic Metabolic Panel: Recent Labs  Lab 01/26/20 0509 01/26/20 0509 01/27/20 0417 01/27/20 0417 01/27/20 1727 01/28/20 0402 01/28/20 1742 01/29/20 0351 01/29/20 1800 01/30/20 0400  NA 141  --  141  --   --  137  --  137  --  138  K 3.1*    < > 3.8  --  3.6 3.7  --  3.5  --  3.7  CL 110  --  107  --   --  107  --  106  --  107  CO2 23  --  18*  --   --  18*  --  19*  --  22  GLUCOSE 115*  --  168*  --   --  141*  --  139*  --  149*  BUN 21  --  17  --   --  15  --  11  --  11  CREATININE 0.73  --  0.88  --   --  0.85  --  0.77  --  0.70  CALCIUM 8.8*  --  9.0  --   --  8.7*  --  8.3*  --  8.6*  MG 1.8   < > 1.8   < > 1.9 2.1 1.6* 2.1 1.9  --   PHOS 1.2*  --  2.6  --   --  2.1*  --  2.6  --  3.0   < > = values in this interval not displayed.   Liver Function Tests: Recent Labs  Lab 01/25/20 1650  AST 40  ALT 19  ALKPHOS 97  BILITOT 0.8  PROT 7.1  ALBUMIN 2.5*   No results for input(s): LIPASE, AMYLASE in the last 168 hours. No results for input(s): AMMONIA in the last 168 hours. Cardiac Enzymes: No results for input(s): CKTOTAL, CKMB, CKMBINDEX, TROPONINI in the last 168 hours. BNP (last 3 results) Recent Labs    01/06/20 0821 01/13/20 0659 01/26/20 2339  BNP 1,046.8* 1,662.9* 1,886.4*    ProBNP (last 3 results) No results for input(s): PROBNP in the last 8760 hours.  CBG: Recent Labs  Lab 01/29/20 1614 01/29/20 1913 01/29/20 2329 01/30/20 0408 01/30/20 0712  GLUCAP 109* 201* 135* 135* 145*   Recent Results (from the past 240 hour(s))  Expectorated sputum assessment w rflx to resp cult     Status: None   Collection Time: 01/26/20 11:39 PM   Specimen: Expectorated Sputum  Result Value Ref Range Status   Specimen Description EXPECTORATED SPUTUM  Final   Special Requests NONE  Final   Sputum evaluation   Final    THIS SPECIMEN IS ACCEPTABLE FOR SPUTUM CULTURE Performed at [facility], [address], [place], [place] [address]    Report Status 01/27/2020 FINAL  Final  Culture, respiratory     Status: None   Collection Time: 01/26/20 11:39 PM  Result Value Ref Range Status   Specimen Description   Final    EXPECTORATED SPUTUM Performed at [facility], [address] [place], [place] [address]    Special Requests   Final    NONE Reflexed  from [id number] Performed at [facility], [address] [place], [place] [address]    Gram Stain   Final    MODERATE SQUAMOUS EPITHELIAL CELLS PRESENT FEW WBC PRESENT, PREDOMINANTLY PMN RARE GRAM POSITIVE COCCI RARE GRAM NEGATIVE RODS    Culture   Final    NO GROWTH 2 DAYS Performed at [facility], [address], [place], [place] [address]    Report Status 01/29/2020 FINAL  Final     Studies: No results found.    Joycelyn Das, MD  [facility] 01/30/2020

## 2020-01-30 NOTE — Progress Notes (Signed)
PHARMACY CONSULT NOTE  Pharmacy Consult for Electrolyte Monitoring and Replacement   Recent Labs: Potassium (mmol/L)  Date Value  01/30/2020 3.7   Magnesium (mg/dL)  Date Value  63/78/5885 1.9   Calcium (mg/dL)  Date Value  02/77/4128 8.6 (L)   Albumin (g/dL)  Date Value  78/67/6720 2.5 (L)   Phosphorus (mg/dL)  Date Value  94/70/9628 3.0   Sodium (mmol/L)  Date Value  01/30/2020 138   Assessment: 69 year old male transferred to ICU for acute toxic metabolic encephalopathy. Patient now more alert and awake. Overnight 11/10, patient with two runs pulseless VT, with second episode requiring CPR and defibrillation. Converted back into baseline bundle branch rhythm with rate in 50's-60's.  Pt received magnesium 2 gm during episode. Similar episodes 11/11 requiring defibrillation. Pharmacy to manage electrolytes.  Patient intubated 11/14 and extubated 11/20. Patient dec'd from Bi-PAP to [place] on 2L O2sat 93%. Unable to be evaluated for bedside swallow evaluation at this time. Per chart review, good UOP. Appears to be having diarrhea / frequent bowel movements.    Diuretics: IV Lasix 20 mg daily.   Goal of Therapy:  K > 4 Mg > 2 All other electrolytes within normal limits  Plan:  KCl 40 mEq x 1 Mg 2 g IV x 1.   --Follow-up electrolytes with AM labs  Ronnald Ramp  01/30/2020 8:27 AM

## 2020-01-30 NOTE — Progress Notes (Signed)
Progress Note  Patient Name: Travis Maxwell Date of Encounter: 01/30/2020  [facility] Cardiologist: Lorine Bears, MD   Subjective   Patient became agitated earlier this morning, received benzodiazepine, currently somnolent.  Has not been able to get p.o. medications  Inpatient Medications    Scheduled Meds: . allopurinol  300 mg Oral Daily  . aspirin  81 mg Oral Daily  . atorvastatin  40 mg Oral QHS  . carvedilol  6.25 mg Oral BID WC  . chlorhexidine  15 mL Mouth Rinse BID  . Chlorhexidine Gluconate Cloth  6 each Topical Daily  . diazepam      . docusate  100 mg Oral BID  . feeding supplement  237 mL Oral TID BM  . fentaNYL (SUBLIMAZE) injection  25 mcg Intravenous Once  . furosemide  20 mg Intravenous Daily  . insulin aspart  0-9 Units Subcutaneous Q4H  . mouth rinse  15 mL Mouth Rinse q12n4p  . OLANZapine  2.5 mg Oral QHS  . polyethylene glycol  17 g Oral Daily  . potassium chloride  40 mEq Oral Once  . sacubitril-valsartan  1 tablet Oral BID  . scopolamine  1 patch Transdermal Q72H  . senna-docusate  1 tablet Oral BID  . sodium chloride flush  10-40 mL Intracatheter Q12H  . sodium chloride flush  3 mL Intravenous Q12H   Continuous Infusions: . sodium chloride Stopped (01/26/20 0204)  . amiodarone 30 mg/hr (01/30/20 1113)  . famotidine (PEPCID) IV 20 mg (01/30/20 0903)  . heparin 1,200 Units/hr (01/30/20 1118)   PRN Meds: sodium chloride, acetaminophen, albuterol, diazepam, haloperidol lactate, metoprolol tartrate, ondansetron (ZOFRAN) IV, oxyCODONE, polyethylene glycol   Vital Signs    Vitals:   01/30/20 0400 01/30/20 0500 01/30/20 0600 01/30/20 0800  BP: (!) 135/96 (!) 141/100 (!) 140/104   Pulse: 87 86 (!) 115   Resp: 19 (!) 31 (!) 26   Temp:    98.9 F (37.2 C)  TempSrc:    Axillary  SpO2: 94% 97% 100%   Weight:      Height:        Intake/Output Summary (Last 24 hours) at 01/30/2020 1232 Last data filed at 01/30/2020 0600 Gross per 24 hour   Intake 610.09 ml  Output 1350 ml  Net -739.91 ml   Last 3 Weights 01/30/2020 01/29/2020 01/28/2020  Weight (lbs) 158 lb 11.7 oz 158 lb 8.2 oz 158 lb 8.2 oz  Weight (kg) 72 kg 71.9 kg 71.9 kg      Telemetry    Sinus rhythm- Personally Reviewed  ECG    Sinus rhythm- Personally Reviewed  Physical Exam   GEN:  Somnolent.   Neck: No JVD Cardiac: RRR, no murmurs, rubs, or gallops.  Respiratory:  Bilateral basal crackles GI: Soft, nontender, non-distended  MS: No edema; No deformity. Neuro:   Somnolent, Psych: Unable to assess  Labs    High Sensitivity Troponin:   Recent Labs  Lab 01/09/20 0152 01/16/20 0245 01/16/20 0702 01/16/20 0838 01/19/20 0433  TROPONINIHS 89* 934* 711* 674* 234*      Chemistry Recent Labs  Lab 01/25/20 1650 01/26/20 0509 01/28/20 0402 01/29/20 0351 01/30/20 0400  NA 141   < > 137 137 138  K 3.1*   < > 3.7 3.5 3.7  CL 105   < > 107 106 107  CO2 25   < > 18* 19* 22  GLUCOSE 119*   < > 141* 139* 149*  BUN 27*   < >  15 11 11   CREATININE 0.83   < > 0.85 0.77 0.70  CALCIUM 8.9   < > 8.7* 8.3* 8.6*  PROT 7.1  --   --   --   --   ALBUMIN 2.5*  --   --   --   --   AST 40  --   --   --   --   ALT 19  --   --   --   --   ALKPHOS 97  --   --   --   --   BILITOT 0.8  --   --   --   --   GFRNONAA >60   < > >60 >60 >60  ANIONGAP 11   < > 12 12 9    < > = values in this interval not displayed.     Hematology Recent Labs  Lab 01/28/20 0402 01/29/20 0351 01/30/20 0400  WBC 19.3* 16.9* 15.6*  RBC 6.78* 7.08* 7.30*  HGB 14.2 14.8 15.9  HCT 45.0 47.3 48.9  MCV 66.4* 66.8* 67.0*  MCH 20.9* 20.9* 21.8*  MCHC 31.6 31.3 32.5  RDW 26.8* 26.7* 27.7*  PLT 972* 985* 979*    BNP Recent Labs  Lab 01/26/20 2339  BNP 1,886.4*     DDimer No results for input(s): DDIMER in the last 168 hours.   Radiology    No results found.  Cardiac Studies   Echo 01/21/20: 1. Left ventricular ejection fraction, by estimation, is 20 to 25%. The   left ventricle has severely decreased function. The left ventricle  demonstrates regional wall motion abnormalities (see scoring  diagram/findings for description). The left  ventricular internal cavity size was moderately dilated. There is mild  left ventricular hypertrophy. Left ventricular diastolic parameters are  consistent with Grade I diastolic dysfunction (impaired relaxation).  Elevated left atrial pressure. There is  severe global hypokinesis with relative sparing of the basal segments.  2. Right ventricular systolic function is moderately reduced. The right  ventricular size is mildly enlarged. Tricuspid regurgitation signal is  inadequate for assessing PA pressure.  3. Left atrial size was mildly dilated.  4. The mitral valve is grossly normal. Mild mitral valve regurgitation.  No evidence of mitral stenosis.  5. The aortic valve was not well visualized. Aortic valve regurgitation  is not visualized. Echo findings are consistent with normal structure and  function of the aortic valve prosthesis.  6. Pulmonic valve regurgitation not well assessed.  LHC 01/24/20:  Previously placed Prox RCA to Mid RCA stent (unknown type) is widely patent.  Prox RCA lesion is 50% stenosed.  1. Patent stent in a nondominant right coronary artery. No significant coronary artery disease overall. 2. The replaced aortic valve was not crossed. 3. Right heart catheterization showed mildly elevated filling pressures, minimal pulmonary hypertension and normal cardiac output.  Patient Profile     69 y.o. male with history of CAD/PCI to RCA in 2019, diabetes, tobacco use, paroxysmal atrial fibrillation, NICM EF 20 to 25% who presents with shortness of breath, diagnosed with MRSA and Pseudomonas pneumonia.  Hospital course complicated by persistent VT.   Assessment & Plan    1.  VT -Maintaining sinus rhythm, no recurrence of arrhythmia -Recommend IV amiodarone, patient not tolerating  p.o., somnolent today. -Switch to p.o. amiodarone 400 mg twice daily when able -LifeVest upon discharge, ICD consideration outpatient after he is cleared from infection.  2.  Paroxysmal atrial fibrillation -Maintaining sinus rhythm -Continue amiodarone, Coreg -Heparin drip -  Eliquis when able to take p.o.  3. NICM EF 20-25% -Coreg, Entresto, Lasix  4.  CAD/PCI to RCA -Aspirin, Lipitor.  5.  Pneumonia -abx as per icu team   Greater than 50% was spent in counseling and coordination of care with patient Total encounter time 35 minutes      Signed, Debbe Odea, MD  01/30/2020, 12:32 PM

## 2020-01-31 DIAGNOSIS — I472 Ventricular tachycardia: Secondary | ICD-10-CM | POA: Diagnosis not present

## 2020-01-31 DIAGNOSIS — I480 Paroxysmal atrial fibrillation: Secondary | ICD-10-CM | POA: Diagnosis not present

## 2020-01-31 DIAGNOSIS — I5023 Acute on chronic systolic (congestive) heart failure: Secondary | ICD-10-CM | POA: Diagnosis not present

## 2020-01-31 DIAGNOSIS — J9601 Acute respiratory failure with hypoxia: Secondary | ICD-10-CM | POA: Diagnosis not present

## 2020-01-31 DIAGNOSIS — I4581 Long QT syndrome: Secondary | ICD-10-CM

## 2020-01-31 DIAGNOSIS — I5022 Chronic systolic (congestive) heart failure: Secondary | ICD-10-CM | POA: Diagnosis not present

## 2020-01-31 DIAGNOSIS — R410 Disorientation, unspecified: Secondary | ICD-10-CM | POA: Diagnosis not present

## 2020-01-31 DIAGNOSIS — R627 Adult failure to thrive: Secondary | ICD-10-CM | POA: Diagnosis not present

## 2020-01-31 LAB — CBC WITH DIFFERENTIAL/PLATELET
Abs Immature Granulocytes: 0.13 10*3/uL — ABNORMAL HIGH (ref 0.00–0.07)
Basophils Absolute: 0.4 10*3/uL — ABNORMAL HIGH (ref 0.0–0.1)
Basophils Relative: 2 %
Eosinophils Absolute: 1 10*3/uL — ABNORMAL HIGH (ref 0.0–0.5)
Eosinophils Relative: 6 %
HCT: 50.4 % (ref 39.0–52.0)
Hemoglobin: 16 g/dL (ref 13.0–17.0)
Immature Granulocytes: 1 %
Lymphocytes Relative: 18 %
Lymphs Abs: 2.8 10*3/uL (ref 0.7–4.0)
MCH: 21.4 pg — ABNORMAL LOW (ref 26.0–34.0)
MCHC: 31.7 g/dL (ref 30.0–36.0)
MCV: 67.5 fL — ABNORMAL LOW (ref 80.0–100.0)
Monocytes Absolute: 1 10*3/uL (ref 0.1–1.0)
Monocytes Relative: 7 %
Neutro Abs: 10.8 10*3/uL — ABNORMAL HIGH (ref 1.7–7.7)
Neutrophils Relative %: 66 %
Platelets: 919 10*3/uL (ref 150–400)
RBC: 7.47 MIL/uL — ABNORMAL HIGH (ref 4.22–5.81)
RDW: 28.1 % — ABNORMAL HIGH (ref 11.5–15.5)
Smear Review: NORMAL
WBC: 16.1 10*3/uL — ABNORMAL HIGH (ref 4.0–10.5)
nRBC: 0 % (ref 0.0–0.2)

## 2020-01-31 LAB — COMPREHENSIVE METABOLIC PANEL
ALT: 12 U/L (ref 0–44)
AST: 18 U/L (ref 15–41)
Albumin: 2.6 g/dL — ABNORMAL LOW (ref 3.5–5.0)
Alkaline Phosphatase: 66 U/L (ref 38–126)
Anion gap: 12 (ref 5–15)
BUN: 11 mg/dL (ref 8–23)
CO2: 22 mmol/L (ref 22–32)
Calcium: 8.4 mg/dL — ABNORMAL LOW (ref 8.9–10.3)
Chloride: 104 mmol/L (ref 98–111)
Creatinine, Ser: 0.78 mg/dL (ref 0.61–1.24)
GFR, Estimated: >60 mL/min (ref >60)
Glucose, Bld: 265 mg/dL — ABNORMAL HIGH (ref 70–99)
Potassium: 3.5 mmol/L (ref 3.5–5.1)
Sodium: 138 mmol/L (ref 135–145)
Total Bilirubin: 0.8 mg/dL (ref 0.3–1.2)
Total Protein: 6.9 g/dL (ref 6.5–8.1)

## 2020-01-31 LAB — APTT
aPTT: >160 seconds (ref 24–36)
aPTT: 68 seconds — ABNORMAL HIGH (ref 24–36)

## 2020-01-31 LAB — GLUCOSE, CAPILLARY
Glucose-Capillary: 111 mg/dL — ABNORMAL HIGH (ref 70–99)
Glucose-Capillary: 118 mg/dL — ABNORMAL HIGH (ref 70–99)
Glucose-Capillary: 128 mg/dL — ABNORMAL HIGH (ref 70–99)
Glucose-Capillary: 141 mg/dL — ABNORMAL HIGH (ref 70–99)
Glucose-Capillary: 142 mg/dL — ABNORMAL HIGH (ref 70–99)
Glucose-Capillary: 192 mg/dL — ABNORMAL HIGH (ref 70–99)

## 2020-01-31 LAB — POTASSIUM: Potassium: 4.1 mmol/L (ref 3.5–5.1)

## 2020-01-31 LAB — MAGNESIUM
Magnesium: 1.6 mg/dL — ABNORMAL LOW (ref 1.7–2.4)
Magnesium: 1.8 mg/dL (ref 1.7–2.4)

## 2020-01-31 LAB — PHOSPHORUS: Phosphorus: 3.2 mg/dL (ref 2.5–4.6)

## 2020-01-31 LAB — HEPARIN LEVEL (UNFRACTIONATED): Heparin Unfractionated: 0.43 IU/mL (ref 0.30–0.70)

## 2020-01-31 MED ORDER — POTASSIUM CHLORIDE 20 MEQ PO PACK
40.0000 meq | PACK | Freq: Once | ORAL | Status: DC
  Filled 2020-01-31: qty 2

## 2020-01-31 MED ORDER — LORAZEPAM 2 MG/ML PO CONC
1.0000 mg | Freq: Four times a day (QID) | ORAL | Status: DC | PRN
  Administered 2020-02-02 – 2020-02-04 (×3): 1 mg via ORAL
  Filled 2020-01-31 (×4): qty 0.5

## 2020-01-31 MED ORDER — MAGNESIUM SULFATE 2 GM/50ML IV SOLN
2.0000 g | Freq: Once | INTRAVENOUS | Status: AC
  Administered 2020-01-31: 2 g via INTRAVENOUS
  Filled 2020-01-31: qty 50

## 2020-01-31 MED ORDER — SPIRONOLACTONE 25 MG PO TABS
25.0000 mg | ORAL_TABLET | Freq: Every day | ORAL | Status: DC
  Administered 2020-02-01 – 2020-02-03 (×3): 25 mg via ORAL
  Filled 2020-01-31 (×3): qty 1

## 2020-01-31 MED ORDER — OLANZAPINE 10 MG IM SOLR
10.0000 mg | Freq: Three times a day (TID) | INTRAMUSCULAR | Status: DC | PRN
  Administered 2020-02-01 – 2020-02-03 (×4): 10 mg via INTRAMUSCULAR
  Filled 2020-01-31 (×6): qty 10

## 2020-01-31 MED ORDER — POTASSIUM CHLORIDE 10 MEQ/50ML IV SOLN
10.0000 meq | INTRAVENOUS | Status: AC
  Administered 2020-01-31 (×4): 10 meq via INTRAVENOUS
  Filled 2020-01-31 (×4): qty 50

## 2020-01-31 NOTE — Progress Notes (Addendum)
PROGRESS NOTE  Travis Maxwell MBT:[id number] DOB: 15-Nov-1950 DOA: 01/06/2020 PCP: [facility]   LOS: 25 days   Brief narrative:  69 years old African-American male with past medical history of hypertension, diabetes mellitus type 2, hyperlipidemia, GERD, coronary artery disease status post PCI, history of aortic valve replacement on Eliquis presented to the hospital with respiratory distress chest pain and shortness of breath on 01/06/2020.  He also had orthopnea and leg swelling.  Initially was put on BiPAP but his breathing status worsened so he was emergently intubated and admitted to the hospital.  Patient was noted to be in severe cardiogenic shock on 01/07/2020.  Vasopressors were weaned off on 11/ 6 and patient was extubated to BiPAP at that time but patient was subsequently intubated on 01/19/2020.Travis Kitchen  Patient also had severe metabolic acidosis.  EKG showed some concerns for ST elevation but did not meet ST elevation MI criteria as per cardiology.  Patient received IV Cardizem and have a borderline elevated troponins.  BNP on admission was 1046.  Patient had a chest x-ray which showed vascular congestion.  Of note patient has history of Covid pneumonia on 12/04/2019.  Patient remained extubated and was off pressors on 01/29/20 and was subsequently considered for transfer to medical service.  Assessment/Plan:  Active Problems:   Acute on chronic systolic congestive heart failure (HCC)   Respiratory failure (HCC)   Malnutrition of moderate degree   Adult failure to thrive   Palliative care by specialist   DNR (do not resuscitate) discussion   Delirium   Pressure injury of skin   Ventricular tachycardia (HCC)  Acute hypoxic and hypercarbic  respiratory failure. Status post intubation and subsequent extubation.  Currently on nasal cannula oxygen.    Consider BiPAP as needed.   Patient had MRSA and Pseudomonas pneumonia and completed Zyvox and cefepime on 01/28/20.    Acute  systolic congestive heart failure.  Nonischemic cardiomyopathy.  Last known ejection fraction of 20 to 25%. History of substance abuse.  Cardiology on board, underwent cardiac catheterization on 01/24/20.  Cardiac cath showed nonobstructive disease with patent stents.  Patient might need AICD for secondary prevention, follow cardiology recommendations..  Continue IV Lasix, beta-blockers, Entresto  Cardiogenic shock status post ventricular tachycardia Patient underwent electric cardioversion.  Transiently required vasopressors.  Currently on IV amiodarone drip, IV heparin drip.  Monitor electrolytes closely..  Continue aspirin, Lipitor, Coreg  Paroxysmal atrial fibrillation.  Continue Coreg, amiodarone, heparin drip.    Acute kidney injury.   Improved.  Monitor intake and output charting.   Acute metabolic encephalopathy,  Patient required Haldol, diazepam and Precedex drip during ICU.  Continue safety sitter at this time.  Nursing staff reported episodes of agitation confusion and trying to get out of the bed..  History of substance usage. Patient might benefit from benzodiazepines.  Will consider 1 mg IV Ativan every 6 hourly.  Discontinue diazepam.  On olanzapine as needed as needed but will try to avoid if possible.  Patient is on amiodarone.  High chances of QT prolongation and arrhythmias.   MRSA and Pseudomonas pneumonia.  Tracheal culture 11/14 positive for MRSA and pseudomonas. Repeat culture 11/21 showed no growth. Antibiotic course completed 11/23  Dysphagia.  On dysphagia 2 diet.    Mild hypomagnesemia.  Replenished yesterday.  Magnesium still at 1.6.  We will replace.  Pressure injury 01/20/2020.  Sacral stage II ulceration.  Continue wound care. Pressure Injury 01/20/20 Sacrum Posterior;Medial Stage 2 -  Partial thickness loss of  dermis presenting as a shallow open injury with a red, pink wound bed without slough. (Active)  01/20/20 0800  Location: Sacrum  Location  Orientation: Posterior;Medial  Staging: Stage 2 -  Partial thickness loss of dermis presenting as a shallow open injury with a red, pink wound bed without slough.  Wound Description (Comments):   Present on Admission: No   Ethics. patient has been seen by palliative care.  The decision was to proceed with aggressive care  DVT prophylaxis: SCDs Start: 01/06/20 1332, on heparin drip   Code Status: Full code  Family Communication:   I again tried to reach [place], daughter on the phone listed but could not reach her.  Unable to leave a voice message as well  Status is: Inpatient  Remains inpatient appropriate because:Unsafe d/c plan, IV treatments appropriate due to intensity of illness or inability to take PO and Inpatient level of care appropriate due to severity of illness, metabolic encephalopathy   Dispo: The patient is from: Home              Anticipated d/c is to: Skilled nursing facility placement as per physical therapy recommendation.              Anticipated d/c date is: 3 days or more              Patient currently is not medically stable to d/c.   Consultants:  PCCM  Cardiology  Procedures:  01/21/20 -2D echocardiogram:LVEF 20 to 25% with severely decreased function and severe global hypokinesis. Left ventricle moderately dilated with mild left ventricular hypertrophy.Grade 1 diastolic dysfunction and elevated left atrial pressure.Left atrium mildly dilated mild mitral valve regurgitation.  01/24/20 - Right/Left Heart Cath:right heart catheterization showed mildly elevated filling pressures, minimal pulmonary hypertension and normal cardiac output previously placed proximal RCA to mid RCA stent is widely patent.No significant coronary artery disease overall.  Antibiotics:  . Completed course  Subjective:  Today, examined at bedside.  Nursing staff reported that the patient was very agitated and confused and has been requiring sitter and sedation.  Mildly  communicative but confused at the time of my evaluation.  Denied any pain, shortness of breath but difficult to comprehend and rely   Objective: Vitals:   01/31/20 0700 01/31/20 0801  BP: 120/81   Pulse: 92   Resp: (!) 25   Temp:  97.7 F (36.5 C)  SpO2: 96%     Intake/Output Summary (Last 24 hours) at 01/31/2020 0827 Last data filed at 01/31/2020 0800 Gross per 24 hour  Intake 905.72 ml  Output 1200 ml  Net -294.28 ml   Filed Weights   01/28/20 0358 01/29/20 0400 01/30/20 0326  Weight: 71.9 kg 71.9 kg 72 kg   Body mass index is 24.85 kg/m.   Physical Exam:  GENERAL: Patient is alert awake but confused and disoriented.,  Answering few questions right,, on room air.  Agitated mildly HENT: No scleral pallor or icterus. Pupils equally reactive to light. Oral mucosa is moist NECK: is supple, no gross swelling noted. CHEST:   Diminished breath sounds bilaterally.  No obvious wheezing noted. CVS: S1 and S2 heard, no murmur. Regular rate and rhythm.  ABDOMEN: Soft, non-tender, bowel sounds are present.  External urinary catheter in place.  Rectal positive EXTREMITIES: No edema.  Right upper extremity PICC line in place. CNS: Moving extremities.   agitated, confused disoriented SKIN: warm and dry without rashes.  Data Review: I have personally reviewed the following laboratory data and  studies,  CBC: Recent Labs  Lab 01/27/20 0417 01/28/20 0402 01/29/20 0351 01/30/20 0400 01/31/20 0737  WBC 15.4* 19.3* 16.9* 15.6* 16.1*  NEUTROABS 11.1* 13.3* 11.1* 10.1* PENDING  HGB 14.9 14.2 14.8 15.9 16.0  HCT 47.7 45.0 47.3 48.9 50.4  MCV 67.2* 66.4* 66.8* 67.0* 67.5*  PLT 1,005* 972* 985* 979* 919*   Basic Metabolic Panel: Recent Labs  Lab 01/27/20 0417 01/27/20 0417 01/27/20 1727 01/27/20 1727 01/28/20 0402 01/28/20 1742 01/29/20 0351 01/29/20 1800 01/30/20 0400 01/30/20 0800 01/30/20 1806 01/31/20 0409 01/31/20 0737  NA 141  --   --   --  137  --  137  --  138   --   --  138  --   K 3.8   < > 3.6  --  3.7  --  3.5  --  3.7  --   --  3.5  --   CL 107  --   --   --  107  --  106  --  107  --   --  104  --   CO2 18*  --   --   --  18*  --  19*  --  22  --   --  22  --   GLUCOSE 168*  --   --   --  141*  --  139*  --  149*  --   --  265*  --   BUN 17  --   --   --  15  --  11  --  11  --   --  11  --   CREATININE 0.88  --   --   --  0.85  --  0.77  --  0.70  --   --  0.78  --   CALCIUM 9.0  --   --   --  8.7*  --  8.3*  --  8.6*  --   --  8.4*  --   MG 1.8   < > 1.9   < > 2.1   < > 2.1 1.9  --  1.6* 1.8  --  1.6*  PHOS 2.6  --   --   --  2.1*  --  2.6  --  3.0  --   --  3.2  --    < > = values in this interval not displayed.   Liver Function Tests: Recent Labs  Lab 01/25/20 1650 01/31/20 0409  AST 40 18  ALT 19 12  ALKPHOS 97 66  BILITOT 0.8 0.8  PROT 7.1 6.9  ALBUMIN 2.5* 2.6*   No results for input(s): LIPASE, AMYLASE in the last 168 hours. No results for input(s): AMMONIA in the last 168 hours. Cardiac Enzymes: No results for input(s): CKTOTAL, CKMB, CKMBINDEX, TROPONINI in the last 168 hours. BNP (last 3 results) Recent Labs    01/06/20 0821 01/13/20 0659 01/26/20 2339  BNP 1,046.8* 1,662.9* 1,886.4*    ProBNP (last 3 results) No results for input(s): PROBNP in the last 8760 hours.  CBG: Recent Labs  Lab 01/30/20 1537 01/30/20 1937 01/30/20 2340 01/31/20 0341 01/31/20 0722  GLUCAP 140* 190* 112* 192* 142*   Recent Results (from the past 240 hour(s))  Expectorated sputum assessment w rflx to resp cult     Status: None   Collection Time: 01/26/20 11:39 PM   Specimen: Expectorated Sputum  Result Value Ref Range Status   Specimen Description EXPECTORATED SPUTUM  Final   Special  Requests NONE  Final   Sputum evaluation   Final    THIS SPECIMEN IS ACCEPTABLE FOR SPUTUM CULTURE Performed at [facility], [address] [place], [place] [address]    Report Status 01/27/2020 FINAL  Final  Culture, respiratory      Status: None   Collection Time: 01/26/20 11:39 PM  Result Value Ref Range Status   Specimen Description   Final    EXPECTORATED SPUTUM Performed at [facility], [address] [place], [place] [address]    Special Requests   Final    NONE Reflexed from [id number] Performed at [facility], [address] [place], [place] [address]    Gram Stain   Final    MODERATE SQUAMOUS EPITHELIAL CELLS PRESENT FEW WBC PRESENT, PREDOMINANTLY PMN RARE GRAM POSITIVE COCCI RARE GRAM NEGATIVE RODS    Culture   Final    NO GROWTH 2 DAYS Performed at [facility], [address], [place], [place] [address]    Report Status 01/29/2020 FINAL  Final     Studies: No results found.    Joycelyn Das, MD  [facility] 01/31/2020

## 2020-01-31 NOTE — Progress Notes (Signed)
Shift summary:  - Patient remains delirious and belligerent this AM (refuses to answer questions; "Can you tell me your name?" --> "My name Is A- hole").   - Refusing PO intake, likely due to confusion.  - VS WNL and stable.

## 2020-01-31 NOTE — Plan of Care (Signed)
  Problem: Education: Goal: Ability to demonstrate management of disease process will improve Outcome: Not Progressing Goal: Ability to verbalize understanding of medication therapies will improve Outcome: Not Progressing   Problem: Activity: Goal: Capacity to carry out activities will improve Outcome: Not Progressing   Problem: Cardiac: Goal: Ability to achieve and maintain adequate cardiopulmonary perfusion will improve Outcome: Not Progressing   Problem: Education: Goal: Knowledge of General Education information will improve Description: Including pain rating scale, medication(s)/side effects and non-pharmacologic comfort measures Outcome: Not Progressing   Problem: Health Behavior/Discharge Planning: Goal: Ability to manage health-related needs will improve Outcome: Not Progressing   Problem: Clinical Measurements: Goal: Ability to maintain clinical measurements within normal limits will improve Outcome: Not Progressing Goal: Will remain free from infection Outcome: Not Progressing Goal: Diagnostic test results will improve Outcome: Not Progressing Goal: Respiratory complications will improve Outcome: Not Progressing Goal: Cardiovascular complication will be avoided Outcome: Not Progressing   Problem: Activity: Goal: Risk for activity intolerance will decrease Outcome: Not Progressing   Problem: Nutrition: Goal: Adequate nutrition will be maintained Outcome: Not Progressing   Problem: Coping: Goal: Level of anxiety will decrease Outcome: Not Progressing   Problem: Elimination: Goal: Will not experience complications related to bowel motility Outcome: Not Progressing Goal: Will not experience complications related to urinary retention Outcome: Not Progressing   Problem: Pain Managment: Goal: General experience of comfort will improve Outcome: Not Progressing   Problem: Safety: Goal: Ability to remain free from injury will improve Outcome: Not  Progressing   Problem: Skin Integrity: Goal: Risk for impaired skin integrity will decrease Outcome: Not Progressing   

## 2020-01-31 NOTE — Progress Notes (Signed)
  Speech Language Pathology Treatment: Dysphagia  Patient Details Name: Travis Maxwell MRN: [id number] DOB: 03-28-1950 Today's Date: 01/31/2020 Time: 2376-2831 SLP Time Calculation (min) (ACUTE ONLY): 10 min  Assessment / Plan / Recommendation Clinical Impression  Unfortunately, pt continues to have severe delirium that prevents him from consuming PO intake. Per chart and current nurse, pt has only consumed several bites and sips over the last 3 days. Pt receptive to consuming 1 sip of thin liquids via straw with me. No overt s/s of aspiration were observed. Pt is at severe risk of malnutrition and dehydration d/t delirium and current cognitive deficits. ST will sign off at this time. Please reconsult if pt's condition improves.    HPI HPI: 69 year old male status post COVID-19 infection 9/21 presenting with acute hypoxic respiratory failure and chest pain failing BiPAP requiring emergent intubation and mechanical ventilation admitted to the ICU. Over the course of 22 days, pt has required several intubations with most recent intuabtion 11/14 until 11/20, transitioned to BIPAP and was weaned to nasal cannula on 11/22. Pt's initial Bedside Swallow Evaluation was performed on 01/14/2020 with recommendation of dysphagia 1 with thin liquids. Pt currently NPO.      SLP Plan  Discharge SLP treatment due to (comment) (ongoing delirium)       Recommendations  Diet recommendations: Dysphagia 2 (fine chop);Thin liquid Liquids provided via: Straw Medication Administration: Crushed with puree Supervision: Full supervision/cueing for compensatory strategies Compensations: Minimize environmental distractions;Slow rate;Small sips/bites Postural Changes and/or Swallow Maneuvers: Seated upright 90 degrees                Oral Care Recommendations: Oral care BID Follow up Recommendations: None SLP Visit Diagnosis: Dysphagia, oral phase (R13.11) Plan: Discharge SLP treatment due to (comment) (ongoing  delirium)       GO               Adonai Helzer B. Dreama Saa M.S., CCC-SLP, [facility] Speech-Language Pathologist Rehabilitation Services Office [phone]  Reuel Derby 01/31/2020, 2:24 PM

## 2020-01-31 NOTE — Progress Notes (Signed)
PHARMACY CONSULT NOTE  Pharmacy Consult for Electrolyte Monitoring and Replacement   Recent Labs: Potassium (mmol/L)  Date Value  01/31/2020 3.5   Magnesium (mg/dL)  Date Value  16/60/6301 1.8   Calcium (mg/dL)  Date Value  60/12/9321 8.4 (L)   Albumin (g/dL)  Date Value  55/73/2202 2.6 (L)   Phosphorus (mg/dL)  Date Value  54/27/0623 3.2   Sodium (mmol/L)  Date Value  01/31/2020 138   Assessment: 69 year old male transferred to ICU for acute toxic metabolic encephalopathy. Patient now more alert and awake. Overnight 11/10, patient with two runs pulseless VT, with second episode requiring CPR and defibrillation. Converted back into baseline bundle branch rhythm with rate in 50's-60's.  Pt received magnesium 2 gm during episode. Similar episodes 11/11 requiring defibrillation. Pharmacy to manage electrolytes.  Patient intubated 11/14 and extubated 11/20. Patient dec'd from Bi-PAP to [place] on 2L O2sat 93%. Unable to be evaluated for bedside swallow evaluation at this time. Per chart review, good UOP. Appears to be having diarrhea / frequent bowel movements.    Diuretics: IV Lasix 20 mg daily.   Goal of Therapy:  K > 4 Mg > 2 All other electrolytes within normal limits  Plan:  Will repeat KCl 40 mEq x 1 & Mg 2 g IV x 1 given LLN of K+ and goal Mag >2.  --Follow-up electrolytes with AM labs  Travis Maxwell  01/31/2020 7:10 AM

## 2020-01-31 NOTE — Progress Notes (Signed)
Progress Note  Patient Name: Travis Maxwell Date of Encounter: 01/31/2020  [facility] Cardiologist: Lorine Bears, MD   Subjective   Patient has been belligerent overnight with sitter at bedside.  He complains about having to wear mittens.  He also notes that his appetite is poor.  He denies chest pain and shortness of breath.  Inpatient Medications    Scheduled Meds: . allopurinol  300 mg Oral Daily  . aspirin  81 mg Oral Daily  . atorvastatin  40 mg Oral QHS  . carvedilol  6.25 mg Oral BID WC  . chlorhexidine  15 mL Mouth Rinse BID  . Chlorhexidine Gluconate Cloth  6 each Topical Daily  . docusate  100 mg Oral BID  . feeding supplement  237 mL Oral TID BM  . fentaNYL (SUBLIMAZE) injection  25 mcg Intravenous Once  . furosemide  20 mg Intravenous Daily  . insulin aspart  0-9 Units Subcutaneous Q4H  . mouth rinse  15 mL Mouth Rinse q12n4p  . OLANZapine  2.5 mg Oral QHS  . polyethylene glycol  17 g Oral Daily  . sacubitril-valsartan  1 tablet Oral BID  . scopolamine  1 patch Transdermal Q72H  . senna-docusate  1 tablet Oral BID  . sodium chloride flush  10-40 mL Intracatheter Q12H  . sodium chloride flush  3 mL Intravenous Q12H   Continuous Infusions: . sodium chloride 10 mL/hr at 01/31/20 0800  . amiodarone 30 mg/hr (01/31/20 0800)  . famotidine (PEPCID) IV 20 mg (01/30/20 0903)  . heparin 1,200 Units/hr (01/31/20 0800)  . magnesium sulfate bolus IVPB 2 g (01/31/20 0939)  . potassium chloride     PRN Meds: sodium chloride, acetaminophen, albuterol, diazepam, haloperidol lactate, metoprolol tartrate, ondansetron (ZOFRAN) IV, oxyCODONE, polyethylene glycol   Vital Signs    Vitals:   01/31/20 0600 01/31/20 0700 01/31/20 0800 01/31/20 0801  BP: 128/81 120/81 (!) 141/100 (!) 141/100  Pulse: 96 92 88 96  Resp: 20 (!) 25 (!) 30 20  Temp:    97.7 F (36.5 C)  TempSrc:      SpO2: 95% 96% 96% 97%  Weight:      Height:        Intake/Output Summary (Last 24  hours) at 01/31/2020 0947 Last data filed at 01/31/2020 0800 Gross per 24 hour  Intake 935.09 ml  Output 1200 ml  Net -264.91 ml   Last 3 Weights 01/30/2020 01/29/2020 01/28/2020  Weight (lbs) 158 lb 11.7 oz 158 lb 8.2 oz 158 lb 8.2 oz  Weight (kg) 72 kg 71.9 kg 71.9 kg      Telemetry    Sinus rhythm with frequent PVCs - Personally Reviewed  ECG    No new tracing.  Physical Exam   GEN: No acute distress.   Neck: No JVD Cardiac: RRR, no murmurs, rubs, or gallops.  Respiratory:  Clear anteriorly, as patient is not willing/able to sit up for posterior auscultation. GI: Soft, nontender, non-distended  MS: No edema; No deformity. Neuro:  Nonfocal.  Psych: Awake but confused.  Labs    High Sensitivity Troponin:   Recent Labs  Lab 01/09/20 0152 01/16/20 0245 01/16/20 0702 01/16/20 0838 01/19/20 0433  TROPONINIHS 89* 934* 711* 674* 234*      Chemistry Recent Labs  Lab 01/25/20 1650 01/26/20 0509 01/29/20 0351 01/30/20 0400 01/31/20 0409  NA 141   < > 137 138 138  K 3.1*   < > 3.5 3.7 3.5  CL 105   < >  106 107 104  CO2 25   < > 19* 22 22  GLUCOSE 119*   < > 139* 149* 265*  BUN 27*   < > 11 11 11   CREATININE 0.83   < > 0.77 0.70 0.78  CALCIUM 8.9   < > 8.3* 8.6* 8.4*  PROT 7.1  --   --   --  6.9  ALBUMIN 2.5*  --   --   --  2.6*  AST 40  --   --   --  18  ALT 19  --   --   --  12  ALKPHOS 97  --   --   --  66  BILITOT 0.8  --   --   --  0.8  GFRNONAA >60   < > >60 >60 >60  ANIONGAP 11   < > 12 9 12    < > = values in this interval not displayed.     Hematology Recent Labs  Lab 01/29/20 0351 01/30/20 0400 01/31/20 0737  WBC 16.9* 15.6* 16.1*  RBC 7.08* 7.30* 7.47*  HGB 14.8 15.9 16.0  HCT 47.3 48.9 50.4  MCV 66.8* 67.0* 67.5*  MCH 20.9* 21.8* 21.4*  MCHC 31.3 32.5 31.7  RDW 26.7* 27.7* 28.1*  PLT 985* 979* 919*    BNP Recent Labs  Lab 01/26/20 2339  BNP 1,886.4*     DDimer No results for input(s): DDIMER in the last 168 hours.    Radiology    No results found.  Cardiac Studies   R/LHC (01/24/2020): 1.  Patent stent in a nondominant right coronary artery.  No significant coronary artery disease overall. 2.  The replaced aortic valve was not crossed. 3.  Right heart catheterization showed mildly elevated filling pressures, minimal pulmonary hypertension and normal cardiac output.  TTE (01/21/2020): 1. Left ventricular ejection fraction, by estimation, is 20 to 25%. The  left ventricle has severely decreased function. The left ventricle  demonstrates regional wall motion abnormalities (see scoring  diagram/findings for description). The left  ventricular internal cavity size was moderately dilated. There is mild  left ventricular hypertrophy. Left ventricular diastolic parameters are  consistent with Grade I diastolic dysfunction (impaired relaxation).  Elevated left atrial pressure. There is  severe global hypokinesis with relative sparing of the basal segments.  2. Right ventricular systolic function is moderately reduced. The right  ventricular size is mildly enlarged. Tricuspid regurgitation signal is  inadequate for assessing PA pressure.  3. Left atrial size was mildly dilated.  4. The mitral valve is grossly normal. Mild mitral valve regurgitation.  No evidence of mitral stenosis.  5. The aortic valve was not well visualized. Aortic valve regurgitation  is not visualized. Echo findings are consistent with normal structure and  function of the aortic valve prosthesis.  6. Pulmonic valve regurgitation not well assessed.   Patient Profile     69 y.o. male chronic HFrEF due to nonischemic cardiomyopathy, CAD with PCI to nondominant RCA (2019), PAF, diabetes mellitus, tobacco use, admitted with recurrent ventricular tachycardia.  Assessment & Plan    Ventricular tachycardia: No further episodes of significant NSVT or sustained VT noted, though frequent PVCs are still present.  Patient too  confused to routinely take oral medications, including amiodarone.  I have reviewed his amiodarone dosing, and he has received greater than 5000 mg of IV amiodarone, which would be an adequate load.  Continue IV amiodarone at current rate until mental status allows for regular administration of oral medications.  At that time, Mr. Hinks can be switched to amiodarone 400 mg p.o. daily.  Continue carvedilol 6.25 mg twice daily.  Maintain potassium and magnesium levels greater than 4.0 and 2.0, respectively.  Anticipate discharge with LifeVest and placement of ICD for secondary prevention once infection has resolved.  Repeat EKG to assess QT in the setting of olanzapine use.  Chronic HFrEF due to nonischemic cardiomyopathy: Mr. Levels appears grossly euvolemic.  He was minimally net positive yesterday.  Continue carvedilol 6.25 mg twice daily and Entresto 24-26 mg twice daily, though oral medication compliance remains challenging.  Continue furosemide 20 mg IV daily, with plans to transition to oral therapy once Mr. Hollingsworth is willing to take his medications regularly.  Start spironolactone 25 mg daily.  Paroxysmal atrial fibrillation: No evidence of atrial fibrillation over the last 24 hours.  Continue amiodarone and carvedilol, as outlined above.  Continue IV heparin with plans to transition to apixaban when Mr. Hearty is taking oral medications regularly.  Delirium: Most likely related to prolonged hospitalization/ICU stay.  Attempt to minimize sedating medications.  Minimize use of QT prolonging medications including antipsychotics, given prolonged QT.  Repeat EKG today to assess QT interval.    For questions or updates, please contact [facility] Please consult [email or web address] for contact info under [facility].     Signed, Yvonne Kendall, MD  01/31/2020, 9:47 AM

## 2020-01-31 NOTE — Progress Notes (Signed)
ANTICOAGULATION CONSULT NOTE   Pharmacy Consult for heparin Indication: atrial fibrillation  Allergies  Allergen Reactions  . Penicillins Other (See Comments)    Has patient had a PCN reaction causing immediate rash, facial/tongue/throat swelling, SOB or lightheadedness with hypotension: Unknown Has patient had a PCN reaction causing severe rash involving mucus membranes or skin necrosis: Unknown Has patient had a PCN reaction that required hospitalization: Unknown Has patient had a PCN reaction occurring within the last 10 years: No If all of the above answers are "NO", then may proceed with Cephalosporin use.    Patient Measurements: Height: 5' 7.01" (170.2 cm) Weight: 72 kg (158 lb 11.7 oz) IBW/kg (Calculated) : 66.12 Heparin Dosing Weight: 67 kg  Vital Signs: Temp: 98.6 F (37 C) (11/25 1600) Temp Source: Oral (11/25 1600) BP: 124/83 (11/26 0300) Pulse Rate: 85 (11/26 0300)  Labs: Recent Labs    01/28/20 0402 01/28/20 0402 01/29/20 0351 01/30/20 0400 01/30/20 1115 01/30/20 1806 01/31/20 0145  HGB 14.2   < > 14.8 15.9  --   --   --   HCT 45.0  --  47.3 48.9  --   --   --   PLT 972*  --  985* 979*  --   --   --   APTT  --   --   --   --   --  64* >160*  HEPARINUNFRC 0.35  --  0.23*  --  1.31*  --   --   CREATININE 0.85  --  0.77 0.70  --   --   --    < > = values in this interval not displayed.    Estimated Creatinine Clearance: 83.8 mL/min (by C-G formula based on SCr of 0.7 mg/dL).   Medical History: Past Medical History:  Diagnosis Date  . CAD (coronary artery disease)   . Chronic systolic (congestive) heart failure (HCC)   . Diabetes mellitus without complication (HCC)   . GERD (gastroesophageal reflux disease)   . Gout   . Hx of aortic valve replacement    Bioprosthetic valve  . Hypertension    Assessment: 69 year old male with h/o CAD, HFrEF, aortic regurgitation and stenosis s/p bioprosthetic aortic valve, afib on Eliquis. Patient with episodes  of ventricular tachycardia requiring defibrillation multiple times. Patient has been on Eliquis here. Heart cath 11/19. Plan was started back on Apixaban but this morning (11/25) pt is unable to take PO and is being placed back on heparin drip. Last apixaban dose was 11/24 @2218 . Patient was previously therapeutic on 1100 units/hr then was subtherapeutic on 11/23 with HL 0.23 and plan was to increase rate to 1200 units/hr.   11/23 @ 0402 HL 0.35, therapeutic 11/24 @ 0351 HL 0.23, subtherapeutic. No problems w/ infusion per RN.  Will increase Heparin infusion to 1200 units/hr and recheck in 6 hrs  Goal of Therapy:  Heparin level 0.3-0.7 units/ml once HL and aPTT begin to correlate and if heparin is still continued aPTT 66-102 seconds Monitor platelets by anticoagulation protocol: Yes   Plan:  Will not bolus as patient was on apixaban with last dose 11/24 @2218 . Will restart heparin at 1200units/hr as per previous plan given previous subtherapeutic HL on 11/24.  11/26 @ 0145 aPTT > 160 after previous aPTT was 64 with no changes.  Suspect inaccurate value as specimen may have been collected in same area as heparin. Will order repeat aPTT for am with other labs and continue to monitor and adjust dose as  clinically indicated. Will also check HL for correlation. If heparin is still continued, will begin to use HL once aPTT and HL begin to correlate.  Monitor CBC per protocol  Wayland Denis, PharmD 01/31/2020 3:27 AM

## 2020-01-31 NOTE — Progress Notes (Addendum)
ANTICOAGULATION CONSULT NOTE   Pharmacy Consult for heparin Indication: atrial fibrillation  Allergies  Allergen Reactions  . Penicillins Other (See Comments)    Has patient had a PCN reaction causing immediate rash, facial/tongue/throat swelling, SOB or lightheadedness with hypotension: Unknown Has patient had a PCN reaction causing severe rash involving mucus membranes or skin necrosis: Unknown Has patient had a PCN reaction that required hospitalization: Unknown Has patient had a PCN reaction occurring within the last 10 years: No If all of the above answers are "NO", then may proceed with Cephalosporin use.    Patient Measurements: Height: 5' 7.01" (170.2 cm) Weight: 72 kg (158 lb 11.7 oz) IBW/kg (Calculated) : 66.12 Heparin Dosing Weight: 67 kg  Vital Signs: Temp: 97.7 F (36.5 C) (11/26 0801) BP: 141/100 (11/26 0801) Pulse Rate: 96 (11/26 0801)  Labs: Recent Labs    01/29/20 0351 01/29/20 0351 01/30/20 0400 01/30/20 1115 01/30/20 1806 01/31/20 0145 01/31/20 0409 01/31/20 0737  HGB 14.8   < > 15.9  --   --   --   --  16.0  HCT 47.3  --  48.9  --   --   --   --  50.4  PLT 985*  --  979*  --   --   --   --  919*  APTT  --   --   --   --  64* >160*  --  68*  HEPARINUNFRC 0.23*  --   --  1.31*  --   --   --   --   CREATININE 0.77  --  0.70  --   --   --  0.78  --    < > = values in this interval not displayed.    Estimated Creatinine Clearance: 83.8 mL/min (by C-G formula based on SCr of 0.78 mg/dL).   Medical History: Past Medical History:  Diagnosis Date  . CAD (coronary artery disease)   . Chronic systolic (congestive) heart failure (HCC)   . Diabetes mellitus without complication (HCC)   . GERD (gastroesophageal reflux disease)   . Gout   . Hx of aortic valve replacement    Bioprosthetic valve  . Hypertension    Assessment: 69 year old male with h/o CAD, HFrEF, aortic regurgitation and stenosis s/p bioprosthetic aortic valve, afib on Eliquis.  Patient with episodes of ventricular tachycardia requiring defibrillation multiple times. Patient has been on Eliquis here. Heart cath 11/19. Plan was started back on Apixaban but this morning (11/25) pt is unable to take PO and is being placed back on heparin drip. Last apixaban dose was 11/24 @2218 . Patient was previously therapeutic on 1100 units/hr then was subtherapeutic on 11/23 with HL 0.23 and plan was to increase rate to 1200 units/hr.   11/23 @ 0402 HL 0.35, therapeutic 11/24 @ 0351 HL 0.23, subtherapeutic. No problems w/ infusion per RN.  Will increase Heparin infusion to 1200 units/hr and recheck in 6 hrs  Goal of Therapy:  Heparin level 0.3-0.7 units/ml once HL and aPTT begin to correlate and if heparin is still continued aPTT 66-102 seconds Monitor platelets by anticoagulation protocol: Yes   Plan:  Will not bolus as patient was on apixaban with last dose 11/24 @2218 . Will restart heparin at 1200units/hr as per previous plan given previous subtherapeutic HL on 11/24.  11/26 @ 0145 aPTT > 160 (error: same line as heparin). On repeat aPTT is 64>68 (HL 0.43) will continue with current rate 1200 units/hr. If heparin is still continued, will  begin to use HL once aPTT and HL begin to correlate. 2 consecutive therapeutic levels; will transition to daily aPTT monitoring. Monitor CBC per protocol  Martyn Malay, PharmD 01/31/2020 8:30 AM

## 2020-02-01 DIAGNOSIS — R410 Disorientation, unspecified: Secondary | ICD-10-CM | POA: Diagnosis not present

## 2020-02-01 DIAGNOSIS — J9601 Acute respiratory failure with hypoxia: Secondary | ICD-10-CM | POA: Diagnosis not present

## 2020-02-01 DIAGNOSIS — R627 Adult failure to thrive: Secondary | ICD-10-CM | POA: Diagnosis not present

## 2020-02-01 DIAGNOSIS — I5023 Acute on chronic systolic (congestive) heart failure: Secondary | ICD-10-CM | POA: Diagnosis not present

## 2020-02-01 DIAGNOSIS — I4891 Unspecified atrial fibrillation: Secondary | ICD-10-CM | POA: Diagnosis not present

## 2020-02-01 LAB — CBC WITH DIFFERENTIAL/PLATELET
Abs Immature Granulocytes: 0.13 10*3/uL — ABNORMAL HIGH (ref 0.00–0.07)
Basophils Absolute: 0.4 10*3/uL — ABNORMAL HIGH (ref 0.0–0.1)
Basophils Relative: 2 %
Eosinophils Absolute: 1.3 10*3/uL — ABNORMAL HIGH (ref 0.0–0.5)
Eosinophils Relative: 7 %
HCT: 51.7 % (ref 39.0–52.0)
Hemoglobin: 16 g/dL (ref 13.0–17.0)
Immature Granulocytes: 1 %
Lymphocytes Relative: 16 %
Lymphs Abs: 2.8 10*3/uL (ref 0.7–4.0)
MCH: 21.3 pg — ABNORMAL LOW (ref 26.0–34.0)
MCHC: 30.9 g/dL (ref 30.0–36.0)
MCV: 68.8 fL — ABNORMAL LOW (ref 80.0–100.0)
Monocytes Absolute: 1.3 10*3/uL — ABNORMAL HIGH (ref 0.1–1.0)
Monocytes Relative: 7 %
Neutro Abs: 12.4 10*3/uL — ABNORMAL HIGH (ref 1.7–7.7)
Neutrophils Relative %: 67 %
Platelets: 893 10*3/uL — ABNORMAL HIGH (ref 150–400)
RBC: 7.51 MIL/uL — ABNORMAL HIGH (ref 4.22–5.81)
RDW: 28 % — ABNORMAL HIGH (ref 11.5–15.5)
Smear Review: INCREASED
WBC: 18.3 10*3/uL — ABNORMAL HIGH (ref 4.0–10.5)
nRBC: 0 % (ref 0.0–0.2)

## 2020-02-01 LAB — BASIC METABOLIC PANEL
Anion gap: 11 (ref 5–15)
BUN: 12 mg/dL (ref 8–23)
CO2: 20 mmol/L — ABNORMAL LOW (ref 22–32)
Calcium: 9.2 mg/dL (ref 8.9–10.3)
Chloride: 109 mmol/L (ref 98–111)
Creatinine, Ser: 0.88 mg/dL (ref 0.61–1.24)
GFR, Estimated: >60 mL/min (ref >60)
Glucose, Bld: 109 mg/dL — ABNORMAL HIGH (ref 70–99)
Potassium: 3.8 mmol/L (ref 3.5–5.1)
Sodium: 140 mmol/L (ref 135–145)

## 2020-02-01 LAB — GLUCOSE, CAPILLARY
Glucose-Capillary: 100 mg/dL — ABNORMAL HIGH (ref 70–99)
Glucose-Capillary: 120 mg/dL — ABNORMAL HIGH (ref 70–99)
Glucose-Capillary: 135 mg/dL — ABNORMAL HIGH (ref 70–99)
Glucose-Capillary: 137 mg/dL — ABNORMAL HIGH (ref 70–99)
Glucose-Capillary: 142 mg/dL — ABNORMAL HIGH (ref 70–99)
Glucose-Capillary: 152 mg/dL — ABNORMAL HIGH (ref 70–99)

## 2020-02-01 LAB — PHOSPHORUS: Phosphorus: 3.6 mg/dL (ref 2.5–4.6)

## 2020-02-01 LAB — MAGNESIUM
Magnesium: 2.1 mg/dL (ref 1.7–2.4)
Magnesium: 1.8 mg/dL (ref 1.7–2.4)

## 2020-02-01 LAB — POTASSIUM: Potassium: 4.1 mmol/L (ref 3.5–5.1)

## 2020-02-01 LAB — HEPARIN LEVEL (UNFRACTIONATED): Heparin Unfractionated: 0.41 IU/mL (ref 0.30–0.70)

## 2020-02-01 LAB — APTT: aPTT: 85 seconds — ABNORMAL HIGH (ref 24–36)

## 2020-02-01 MED ORDER — THIAMINE HCL 100 MG/ML IJ SOLN
100.0000 mg | Freq: Every day | INTRAMUSCULAR | Status: AC
  Administered 2020-02-01 – 2020-02-05 (×5): 100 mg via INTRAVENOUS
  Filled 2020-02-01 (×4): qty 2

## 2020-02-01 MED ORDER — POTASSIUM CHLORIDE 10 MEQ/50ML IV SOLN
10.0000 meq | INTRAVENOUS | Status: AC
  Administered 2020-02-01 (×2): 10 meq via INTRAVENOUS
  Filled 2020-02-01 (×2): qty 50

## 2020-02-01 NOTE — Progress Notes (Signed)
PHARMACY CONSULT NOTE  Pharmacy Consult for Electrolyte Monitoring and Replacement   Recent Labs: Potassium (mmol/L)  Date Value  02/01/2020 3.8   Magnesium (mg/dL)  Date Value  50/38/8828 2.1   Calcium (mg/dL)  Date Value  00/34/9179 9.2   Albumin (g/dL)  Date Value  15/07/6977 2.6 (L)   Phosphorus (mg/dL)  Date Value  48/03/6551 3.6   Sodium (mmol/L)  Date Value  02/01/2020 140   Assessment: 69 year old male transferred to ICU for acute toxic metabolic encephalopathy. Overnight 11/10, patient with two runs pulseless VT, with second episode requiring CPR and defibrillation. Converted back into baseline bundle branch rhythm with rate in 50's-60's.  Similar episodes 11/11 requiring defibrillation. Pharmacy to manage electrolytes.  Patient intubated 11/14 and extubated 11/20.   Appears to be having diarrhea / frequent bowel movements.    Diuretics: IV Lasix 20 mg daily.   Goal of Therapy:  Potassium 4.0 - 5.1 mmol/L Magnesium 2.0 - 2.4 mg/dL All other electrolytes within normal limits  Plan:   IV KCl 10 mEq x 2   Follow-up electrolytes with AM labs  Lowella Bandy  02/01/2020 7:24 AM

## 2020-02-01 NOTE — Plan of Care (Signed)

## 2020-02-01 NOTE — Progress Notes (Signed)
ANTICOAGULATION CONSULT NOTE   Pharmacy Consult for heparin Indication: atrial fibrillation  Allergies  Allergen Reactions  . Penicillins Other (See Comments)    Has patient had a PCN reaction causing immediate rash, facial/tongue/throat swelling, SOB or lightheadedness with hypotension: Unknown Has patient had a PCN reaction causing severe rash involving mucus membranes or skin necrosis: Unknown Has patient had a PCN reaction that required hospitalization: Unknown Has patient had a PCN reaction occurring within the last 10 years: No If all of the above answers are "NO", then may proceed with Cephalosporin use.    Patient Measurements: Height: 5' 7.01" (170.2 cm) Weight: 72 kg (158 lb 11.7 oz) IBW/kg (Calculated) : 66.12 Heparin Dosing Weight: 67 kg  Vital Signs: Temp: 97.6 F (36.4 C) (11/26 2000) Temp Source: Axillary (11/26 2000) BP: 137/105 (11/27 0400) Pulse Rate: 90 (11/27 0400)  Labs: Recent Labs    01/30/20 0400 01/30/20 0400 01/30/20 1115 01/30/20 1806 01/31/20 0145 01/31/20 0409 01/31/20 0737 02/01/20 0250  HGB 15.9   < >  --   --   --   --  16.0 16.0  HCT 48.9  --   --   --   --   --  50.4 51.7  PLT 979*  --   --   --   --   --  919* 893*  APTT  --   --   --    < > >160*  --  68* 85*  HEPARINUNFRC  --   --  1.31*  --   --   --  0.43 0.41  CREATININE 0.70  --   --   --   --  0.78  --  0.88   < > = values in this interval not displayed.    Estimated Creatinine Clearance: 76.2 mL/min (by C-G formula based on SCr of 0.88 mg/dL).   Medical History: Past Medical History:  Diagnosis Date  . CAD (coronary artery disease)   . Chronic systolic (congestive) heart failure (HCC)   . Diabetes mellitus without complication (HCC)   . GERD (gastroesophageal reflux disease)   . Gout   . Hx of aortic valve replacement    Bioprosthetic valve  . Hypertension    Assessment: 69 year old male with h/o CAD, HFrEF, aortic regurgitation and stenosis s/p bioprosthetic  aortic valve, afib on Eliquis. Patient with episodes of ventricular tachycardia requiring defibrillation multiple times. Patient has been on Eliquis here. Heart cath 11/19. Plan was started back on Apixaban but this morning (11/25) pt is unable to take PO and is being placed back on heparin drip. Last apixaban dose was 11/24 @2218 . Patient was previously therapeutic on 1100 units/hr then was subtherapeutic on 11/23 with HL 0.23 and plan was to increase rate to 1200 units/hr.   11/23 @ 0402 HL 0.35, therapeutic 11/24 @ 0351 HL 0.23, subtherapeutic. No problems w/ infusion per RN.  Will increase Heparin infusion to 1200 units/hr and recheck in 6 hrs  Goal of Therapy:  Heparin level 0.3-0.7 units/ml once HL and aPTT begin to correlate and if heparin is still continued aPTT 66-102 seconds Monitor platelets by anticoagulation protocol: Yes   Plan:  Will not bolus as patient was on apixaban with last dose 11/24 @2218 . Will restart heparin at 1200units/hr as per previous plan given previous subtherapeutic HL on 11/24.  11/27 @ 0250 HL 0.41, aPTT 85, therapeutic and correlates Will continue with current rate 1200 units/hr. Will begin to use HL correlates w/ aPTT.  Monitor  CBC per protocol w/ heparin level daily  Wayland Denis, PharmD 02/01/2020 6:06 AM

## 2020-02-01 NOTE — Progress Notes (Addendum)
PROGRESS NOTE  Travis Maxwell PYK:[id number] DOB: Jul 18, 1950 DOA: 01/06/2020 PCP: [facility]   LOS: 26 days   Brief narrative:  69 years old African-American male with past medical history of hypertension, diabetes mellitus type 2, hyperlipidemia, GERD, coronary artery disease status post PCI, history of aortic valve replacement on Eliquis presented to the hospital with respiratory distress chest pain and shortness of breath on 01/06/2020.  He also had orthopnea and leg swelling.  Initially was put on BiPAP but his breathing status worsened so he was emergently intubated and admitted to the hospital.  Patient was noted to be in severe cardiogenic shock on 01/07/2020.  Vasopressors were weaned off on 11/ 6 and patient was extubated to BiPAP at that time but patient was subsequently intubated on 01/19/2020.Marland Kitchen  Patient also had severe metabolic acidosis.  EKG showed some concerns for ST elevation but did not meet ST elevation MI criteria as per cardiology.  Patient received IV Cardizem and have a borderline elevated troponins.  BNP on admission was 1046.  Patient had a chest x-ray which showed vascular congestion.  Of note patient has history of Covid pneumonia on 12/04/2019.  Patient remained extubated and was off pressors on 01/29/20 and was subsequently considered for transfer to medical service.  Assessment/Plan:  Active Problems:   Acute on chronic systolic congestive heart failure (HCC)   Respiratory failure (HCC)   Malnutrition of moderate degree   Adult failure to thrive   Palliative care by specialist   DNR (do not resuscitate) discussion   Delirium   Pressure injury of skin   Ventricular tachycardia (HCC)  Acute hypoxic and hypercarbic  respiratory failure. Status post intubation and subsequent extubation.  Currently on room air.    consider BiPAP as needed.   Patient had MRSA and Pseudomonas pneumonia and completed Zyvox and cefepime on 01/28/20.    Acute systolic  congestive heart failure.  Nonischemic cardiomyopathy.  Last known ejection fraction of 20 to 25%.  Cardiology on board.patient underwent cardiac catheterization on 01/24/20.  Cardiac cath showed nonobstructive disease with patent stents.  Patient might need AICD for secondary prevention, and LifeVest prior to discharge.  Continue IV Lasix, beta-blockers, Entresto  Cardiogenic shock status post ventricular tachycardia Patient underwent electric cardioversion.  Transiently required vasopressors.  Currently on IV amiodarone drip, IV heparin drip.  Monitor electrolytes closely..  Continue aspirin, Lipitor, Coreg  Paroxysmal atrial fibrillation.  Continue Coreg, amiodarone, heparin drip due to difficulty with p.o..    Acute kidney injury.   Improved.  Monitor intake and output charting.   Acute metabolic encephalopathy with delirium,  Patient required Haldol, diazepam and Precedex drip during ICU. As needed Ativan but did not receive it. Has calmed down though is still confused. Olanzapine as needed as needed but will try to avoid if possible.  Patient is on amiodarone.  High chances of QT prolongation and arrhythmias. We will add thiamine as well.  MRSA and Pseudomonas pneumonia.  Tracheal culture 11/14 positive for MRSA and pseudomonas. Repeat culture 11/21 showed no growth. Antibiotic course completed 11/23  Dysphagia.  On dysphagia 2 diet.  Mild hypomagnesemia. Improved after replacement.  Pressure injury 01/20/2020.  Sacral stage II ulceration.  Continue wound care. Pressure Injury 01/20/20 Sacrum Posterior;Medial Stage 2 -  Partial thickness loss of dermis presenting as a shallow open injury with a red, pink wound bed without slough. (Active)  01/20/20 0800  Location: Sacrum  Location Orientation: Posterior;Medial  Staging: Stage 2 -  Partial thickness loss  of dermis presenting as a shallow open injury with a red, pink wound bed without slough.  Wound Description (Comments):   Present  on Admission: No   Ethics. patient has been seen by palliative care.  The decision was to proceed with current level of care.  DVT prophylaxis: SCDs Start: 01/06/20 1332, on heparin drip   Code Status: Full code  Family Communication:  I tried to reach the patient's daughter York Sink on the phone again today but I get the message that the phone is not able to accept the calls. Patient's girlfriend was at bedside. She states that he has been living with the patient for some time now. I have updated the girlfriend about the clinical condition of the patient and asked her to communicate to Ms.  Sink if possible.  Status is: Inpatient  Remains inpatient appropriate because:Unsafe d/c plan, IV treatments appropriate due to intensity of illness or inability to take PO and Inpatient level of care appropriate due to severity of illness, metabolic encephalopathy/need for skilled nursing facility placement   Dispo: The patient is from: Home              Anticipated d/c is to: Skilled nursing facility placement as per physical therapy recommendation.              Anticipated d/c date is: 2 to 3 days              Patient currently is not medically stable to d/c.   Consultants:  PCCM  Cardiology  Procedures:  01/21/20 -2D echocardiogram:LVEF 20 to 25% with severely decreased function and severe global hypokinesis. Left ventricle moderately dilated with mild left ventricular hypertrophy.Grade 1 diastolic dysfunction and elevated left atrial pressure.Left atrium mildly dilated mild mitral valve regurgitation.  01/24/20 - Right/Left Heart Cath:right heart catheterization showed mildly elevated filling pressures, minimal pulmonary hypertension and normal cardiac output previously placed proximal RCA to mid RCA stent is widely patent.No significant coronary artery disease overall.  Antibiotics:  . Completed course  Subjective:  Today, patient was seen and examined at bedside. Appears to be  more calm. Mildly communicative but still confused. Denies any pain, nausea or vomiting but has mild cough. Patient's girlfriend at bedside.  Objective: Vitals:   02/01/20 0700 02/01/20 0800  BP: (!) 164/86 (!) 164/86  Pulse: 92   Resp: (!) 33   Temp:  (!) 96.2 F (35.7 C)  SpO2: 100%     Intake/Output Summary (Last 24 hours) at 02/01/2020 0838 Last data filed at 02/01/2020 0800 Gross per 24 hour  Intake 1167.11 ml  Output 800 ml  Net 367.11 ml   Filed Weights   01/28/20 0358 01/29/20 0400 01/30/20 0326  Weight: 71.9 kg 71.9 kg 72 kg   Body mass index is 24.85 kg/m.   Physical Exam:  General: Patient is alert awake and mildly communicative, calm today, confused. HENT:   No scleral pallor or icterus noted. Oral mucosa is moist.  Chest:    Diminished breath sounds bilaterally. Coarse breath sounds noted. CVS: S1 &S2 heard. No murmur.  Regular rate and rhythm. Abdomen: Soft, nontender, nondistended.  Bowel sounds are heard.  External urinary catheter in place. Extremities: No cyanosis, clubbing or edema.  Peripheral pulses are palpable.  Right upper extremity PICC line in place Psych: Alert awake communicative, confused, calm today CNS:  No cranial nerve deficits. Moving all extremities. Skin: Warm and dry.  No rashes noted.   Data Review: I have personally reviewed the following laboratory  data and studies,  CBC: Recent Labs  Lab 01/28/20 0402 01/29/20 0351 01/30/20 0400 01/31/20 0737 02/01/20 0250  WBC 19.3* 16.9* 15.6* 16.1* 18.3*  NEUTROABS 13.3* 11.1* 10.1* 10.8* 12.4*  HGB 14.2 14.8 15.9 16.0 16.0  HCT 45.0 47.3 48.9 50.4 51.7  MCV 66.4* 66.8* 67.0* 67.5* 68.8*  PLT 972* 985* 979* 919* 893*   Basic Metabolic Panel: Recent Labs  Lab 01/28/20 0402 01/28/20 1742 01/29/20 0351 01/29/20 1800 01/30/20 0400 01/30/20 0800 01/30/20 1806 01/31/20 0409 01/31/20 0737 01/31/20 1744 02/01/20 0250  NA 137  --  137  --  138  --   --  138  --   --  140  K 3.7   --  3.5  --  3.7  --   --  3.5  --  4.1 3.8  CL 107  --  106  --  107  --   --  104  --   --  109  CO2 18*  --  19*  --  22  --   --  22  --   --  20*  GLUCOSE 141*  --  139*  --  149*  --   --  265*  --   --  109*  BUN 15  --  11  --  11  --   --  11  --   --  12  CREATININE 0.85  --  0.77  --  0.70  --   --  0.78  --   --  0.88  CALCIUM 8.7*  --  8.3*  --  8.6*  --   --  8.4*  --   --  9.2  MG 2.1   < > 2.1   < >  --  1.6* 1.8  --  1.6* 1.8 2.1  PHOS 2.1*  --  2.6  --  3.0  --   --  3.2  --   --  3.6   < > = values in this interval not displayed.   Liver Function Tests: Recent Labs  Lab 01/25/20 1650 01/31/20 0409  AST 40 18  ALT 19 12  ALKPHOS 97 66  BILITOT 0.8 0.8  PROT 7.1 6.9  ALBUMIN 2.5* 2.6*   No results for input(s): LIPASE, AMYLASE in the last 168 hours. No results for input(s): AMMONIA in the last 168 hours. Cardiac Enzymes: No results for input(s): CKTOTAL, CKMB, CKMBINDEX, TROPONINI in the last 168 hours. BNP (last 3 results) Recent Labs    01/06/20 0821 01/13/20 0659 01/26/20 2339  BNP 1,046.8* 1,662.9* 1,886.4*    ProBNP (last 3 results) No results for input(s): PROBNP in the last 8760 hours.  CBG: Recent Labs  Lab 01/31/20 1537 01/31/20 2043 01/31/20 2343 02/01/20 0412 02/01/20 0722  GLUCAP 111* 118* 141* 120* 135*   Recent Results (from the past 240 hour(s))  Expectorated sputum assessment w rflx to resp cult     Status: None   Collection Time: 01/26/20 11:39 PM   Specimen: Expectorated Sputum  Result Value Ref Range Status   Specimen Description EXPECTORATED SPUTUM  Final   Special Requests NONE  Final   Sputum evaluation   Final    THIS SPECIMEN IS ACCEPTABLE FOR SPUTUM CULTURE Performed at [facility], [address], [place], [place] [address]    Report Status 01/27/2020 FINAL  Final  Culture, respiratory     Status: None   Collection Time: 01/26/20 11:39 PM  Result Value Ref  Range Status   Specimen Description   Final     EXPECTORATED SPUTUM Performed at [facility], [address] [place], [place] [address]    Special Requests   Final    NONE Reflexed from [id number] Performed at [facility], [address] [place], [place] [address]    Gram Stain   Final    MODERATE SQUAMOUS EPITHELIAL CELLS PRESENT FEW WBC PRESENT, PREDOMINANTLY PMN RARE GRAM POSITIVE COCCI RARE GRAM NEGATIVE RODS    Culture   Final    NO GROWTH 2 DAYS Performed at [facility], [address] [place], [place] [address]    Report Status 01/29/2020 FINAL  Final     Studies: No results found.    Joycelyn Das, MD  [facility] 02/01/2020

## 2020-02-01 NOTE — Progress Notes (Addendum)
Shift summary:  - Continued delirium this AM.  - PO intake slightly better than yesterday, still  insufficient for caloric needs.   Brooke Pace / FMS removed.

## 2020-02-01 NOTE — Progress Notes (Signed)
Progress Note  Patient Name: Travis Maxwell Date of Encounter: 02/01/2020  Primary Cardiologist: Lorine Bears, MD  Subjective   Denies c/p or sob.  Follows commands. Refused most of his PO meds yesterday.  Inpatient Medications    Scheduled Meds: . allopurinol  300 mg Oral Daily  . aspirin  81 mg Oral Daily  . atorvastatin  40 mg Oral QHS  . carvedilol  6.25 mg Oral BID WC  . chlorhexidine  15 mL Mouth Rinse BID  . Chlorhexidine Gluconate Cloth  6 each Topical Daily  . docusate  100 mg Oral BID  . feeding supplement  237 mL Oral TID BM  . fentaNYL (SUBLIMAZE) injection  25 mcg Intravenous Once  . furosemide  20 mg Intravenous Daily  . insulin aspart  0-9 Units Subcutaneous Q4H  . mouth rinse  15 mL Mouth Rinse q12n4p  . polyethylene glycol  17 g Oral Daily  . sacubitril-valsartan  1 tablet Oral BID  . scopolamine  1 patch Transdermal Q72H  . senna-docusate  1 tablet Oral BID  . sodium chloride flush  10-40 mL Intracatheter Q12H  . sodium chloride flush  3 mL Intravenous Q12H  . spironolactone  25 mg Oral Daily   Continuous Infusions: . sodium chloride Stopped (02/01/20 0958)  . amiodarone 30 mg/hr (02/01/20 1000)  . famotidine (PEPCID) IV 100 mL/hr at 02/01/20 1000  . heparin 1,200 Units/hr (02/01/20 1000)   PRN Meds: sodium chloride, acetaminophen, albuterol, LORazepam, metoprolol tartrate, OLANZapine, ondansetron (ZOFRAN) IV, oxyCODONE, polyethylene glycol   Vital Signs    Vitals:   02/01/20 0800 02/01/20 0900 02/01/20 1000 02/01/20 1045  BP: (!) 164/86   128/79  Pulse: 82 81 80 74  Resp: (!) 32 (!) 34 (!) 36 (!) 21  Temp: (!) 96.2 F (35.7 C)     TempSrc: Axillary     SpO2: 100% 100% 95% 99%  Weight:      Height:        Intake/Output Summary (Last 24 hours) at 02/01/2020 1050 Last data filed at 02/01/2020 1000 Gross per 24 hour  Intake 1293.42 ml  Output 800 ml  Net 493.42 ml   Filed Weights   01/28/20 0358 01/29/20 0400 01/30/20 0326   Weight: 71.9 kg 71.9 kg 72 kg    Physical Exam   GEN: Frail/thin, in no acute distress.  HEENT: Grossly normal.  Neck: Supple, no JVD, carotid bruits, or masses. Cardiac: RRR, no murmurs, rubs, or gallops. No clubbing, cyanosis, edema.  Radials 2+, DP/PT 1+ and equal bilaterally.  Respiratory:  Respirations regular and unlabored, clear to auscultation anteriorly/laterally - won't sit up for posterior exam. GI: Soft, nontender, nondistended, BS + x 4. MS: no deformity or atrophy. Skin: warm and dry, no rash. Neuro:  Strength and sensation are intact. Psych: Unable to determine orientation. Flat affect.  Labs    Chemistry Recent Labs  Lab 01/25/20 1650 01/26/20 0509 01/30/20 0400 01/30/20 0400 01/31/20 0409 01/31/20 1744 02/01/20 0250  NA 141   < > 138  --  138  --  140  K 3.1*   < > 3.7   < > 3.5 4.1 3.8  CL 105   < > 107  --  104  --  109  CO2 25   < > 22  --  22  --  20*  GLUCOSE 119*   < > 149*  --  265*  --  109*  BUN 27*   < > 11  --  11  --  12  CREATININE 0.83   < > 0.70  --  0.78  --  0.88  CALCIUM 8.9   < > 8.6*  --  8.4*  --  9.2  PROT 7.1  --   --   --  6.9  --   --   ALBUMIN 2.5*  --   --   --  2.6*  --   --   AST 40  --   --   --  18  --   --   ALT 19  --   --   --  12  --   --   ALKPHOS 97  --   --   --  66  --   --   BILITOT 0.8  --   --   --  0.8  --   --   GFRNONAA >60   < > >60  --  >60  --  >60  ANIONGAP 11   < > 9  --  12  --  11   < > = values in this interval not displayed.     Hematology Recent Labs  Lab 01/30/20 0400 01/31/20 0737 02/01/20 0250  WBC 15.6* 16.1* 18.3*  RBC 7.30* 7.47* 7.51*  HGB 15.9 16.0 16.0  HCT 48.9 50.4 51.7  MCV 67.0* 67.5* 68.8*  MCH 21.8* 21.4* 21.3*  MCHC 32.5 31.7 30.9  RDW 27.7* 28.1* 28.0*  PLT 979* 919* 893*    Cardiac Enzymes  Recent Labs  Lab 01/09/20 0152 01/16/20 0245 01/16/20 0702 01/16/20 0838 01/19/20 0433  TROPONINIHS 89* 934* 711* 674* 234*      BNP Recent Labs  Lab  01/26/20 2339  BNP 1,886.4*      Lipids  Lab Results  Component Value Date   CHOL 106 12/01/2019   HDL 42 12/01/2019   LDLCALC 57 12/01/2019   TRIG 92 01/27/2020   CHOLHDL 2.5 12/01/2019    HbA1c  Lab Results  Component Value Date   HGBA1C 6.4 (H) 11/30/2019    Radiology    No results found.  Telemetry    RSR, freq PVCs/couplets.  No VT - Personally Reviewed  Cardiac Studies   R/LHC (01/24/2020): 1. Patent stent in a nondominant right coronary artery. No significant coronary artery disease overall. 2. The replaced aortic valve was not crossed. 3. Right heart catheterization showed mildly elevated filling pressures, minimal pulmonary hypertension and normal cardiac output.  TTE (01/21/2020): 1. Left ventricular ejection fraction, by estimation, is 20 to 25%. The  left ventricle has severely decreased function. The left ventricle  demonstrates regional wall motion abnormalities (see scoring  diagram/findings for description). The left  ventricular internal cavity size was moderately dilated. There is mild  left ventricular hypertrophy. Left ventricular diastolic parameters are  consistent with Grade I diastolic dysfunction (impaired relaxation).  Elevated left atrial pressure. There is  severe global hypokinesis with relative sparing of the basal segments.  2. Right ventricular systolic function is moderately reduced. The right  ventricular size is mildly enlarged. Tricuspid regurgitation signal is  inadequate for assessing PA pressure.  3. Left atrial size was mildly dilated.  4. The mitral valve is grossly normal. Mild mitral valve regurgitation.  No evidence of mitral stenosis.  5. The aortic valve was not well visualized. Aortic valve regurgitation  is not visualized. Echo findings are consistent with normal structure and  function of the aortic valve prosthesis.  6. Pulmonic valve regurgitation not well assessed.  Patient Profile     69  y.o.malew/ a h/o CAD s/p PCI to the RCA in 2019, tob abuse, DM, HFrEF, mixed ICM/NICM (EF<20% - out of proportion to degree of CAD), AI/AS s/p bioprosthetic AVR (03/2018), PAF on amio and eliquis prior to admission, HTN, HL, tob abuse, marijuana abuse, and gout who was admitted 11/1 w/ resp failure req intubation, pulm edema, hypotension req vasopressors with subsequent development of VT (s/p defib x 2  amio load. Briefly on lido).   Assessment & Plan    1. VT Arrest:  Multiple episodes of VT during admission re defib and AAD therapy.  Seen by EP last week w/ rec to resume amio, which he has been on for over a week. Prev required lido, but this was d/c'd following cath 11/19.  Freq PVCs/couplets, but no recurrent sustained VT.  Remains on IV amio in setting of refusing multiple PO meds yesterday.  Cont IV amio for now along w/  blocker (as he's willing to take).  Supplement potassium to goal of 4.0.  Mg 2.1 this AM.  Eventually, would like to transition to oral amio and place lifevest prior to discharge w/ EP eval and ICD placement for secondary prevention.  2.  Acute on chronic HFrEF/Mixed ICM/NICM:  RHC 11/19 w/ only mildly elevated filling pressures.  Slight net + for admission.  Euvolemic on exam.  Cont plan for  blocker, entresto, spiro as he's willing to take.  Cont lasix 20 IV daily.  3. CAD: s/p prior RCA stenting, which was patent on cath 11/19.  Cont med rx w/ asa, statin,  blocker.  4.  PAF:  Maintaining sinus on  blocker and amio.  Still on IV heparin w/ plan to resume eliquis once reliably taking POs.  5.  Acute hypoxic resp failure/MRSA/pseudomonas PNA:  Extubated 11/20.  6. Delirium:  Avoid QT prolonging meds. QT stable on ECG 11/26.  Signed, Nicolasa Ducking, NP  02/01/2020, 10:50 AM    For questions or updates, please contact   Please consult [email or web address] for contact info under Cardiology/STEMI.

## 2020-02-02 ENCOUNTER — Inpatient Hospital Stay: Payer: Medicare HMO

## 2020-02-02 DIAGNOSIS — I4891 Unspecified atrial fibrillation: Secondary | ICD-10-CM | POA: Diagnosis not present

## 2020-02-02 DIAGNOSIS — R410 Disorientation, unspecified: Secondary | ICD-10-CM | POA: Diagnosis not present

## 2020-02-02 DIAGNOSIS — J9601 Acute respiratory failure with hypoxia: Secondary | ICD-10-CM | POA: Diagnosis not present

## 2020-02-02 DIAGNOSIS — R627 Adult failure to thrive: Secondary | ICD-10-CM | POA: Diagnosis not present

## 2020-02-02 DIAGNOSIS — I5023 Acute on chronic systolic (congestive) heart failure: Secondary | ICD-10-CM | POA: Diagnosis not present

## 2020-02-02 LAB — CBC WITH DIFFERENTIAL/PLATELET
Abs Immature Granulocytes: 0.13 10*3/uL — ABNORMAL HIGH (ref 0.00–0.07)
Basophils Absolute: 0.4 10*3/uL — ABNORMAL HIGH (ref 0.0–0.1)
Basophils Relative: 2 %
Eosinophils Absolute: 1.2 10*3/uL — ABNORMAL HIGH (ref 0.0–0.5)
Eosinophils Relative: 7 %
HCT: 50.6 % (ref 39.0–52.0)
Hemoglobin: 15.8 g/dL (ref 13.0–17.0)
Immature Granulocytes: 1 %
Lymphocytes Relative: 16 %
Lymphs Abs: 2.8 10*3/uL (ref 0.7–4.0)
MCH: 21.6 pg — ABNORMAL LOW (ref 26.0–34.0)
MCHC: 31.2 g/dL (ref 30.0–36.0)
MCV: 69 fL — ABNORMAL LOW (ref 80.0–100.0)
Monocytes Absolute: 1.1 10*3/uL — ABNORMAL HIGH (ref 0.1–1.0)
Monocytes Relative: 6 %
Neutro Abs: 12.7 10*3/uL — ABNORMAL HIGH (ref 1.7–7.7)
Neutrophils Relative %: 68 %
Platelets: 868 10*3/uL — ABNORMAL HIGH (ref 150–400)
RBC: 7.33 MIL/uL — ABNORMAL HIGH (ref 4.22–5.81)
RDW: 28.9 % — ABNORMAL HIGH (ref 11.5–15.5)
WBC: 18.4 10*3/uL — ABNORMAL HIGH (ref 4.0–10.5)
nRBC: 0 % (ref 0.0–0.2)

## 2020-02-02 LAB — BASIC METABOLIC PANEL
Anion gap: 10 (ref 5–15)
BUN: 13 mg/dL (ref 8–23)
CO2: 22 mmol/L (ref 22–32)
Calcium: 9 mg/dL (ref 8.9–10.3)
Chloride: 107 mmol/L (ref 98–111)
Creatinine, Ser: 0.8 mg/dL (ref 0.61–1.24)
GFR, Estimated: >60 mL/min (ref >60)
Glucose, Bld: 121 mg/dL — ABNORMAL HIGH (ref 70–99)
Potassium: 3.6 mmol/L (ref 3.5–5.1)
Sodium: 139 mmol/L (ref 135–145)

## 2020-02-02 LAB — GLUCOSE, CAPILLARY
Glucose-Capillary: 113 mg/dL — ABNORMAL HIGH (ref 70–99)
Glucose-Capillary: 139 mg/dL — ABNORMAL HIGH (ref 70–99)
Glucose-Capillary: 116 mg/dL — ABNORMAL HIGH (ref 70–99)
Glucose-Capillary: 181 mg/dL — ABNORMAL HIGH (ref 70–99)
Glucose-Capillary: 98 mg/dL (ref 70–99)

## 2020-02-02 LAB — PHOSPHORUS: Phosphorus: 3.8 mg/dL (ref 2.5–4.6)

## 2020-02-02 LAB — HEPARIN LEVEL (UNFRACTIONATED): Heparin Unfractionated: 0.41 IU/mL (ref 0.30–0.70)

## 2020-02-02 LAB — MAGNESIUM
Magnesium: 1.6 mg/dL — ABNORMAL LOW (ref 1.7–2.4)
Magnesium: 1.8 mg/dL (ref 1.7–2.4)

## 2020-02-02 MED ORDER — POTASSIUM CHLORIDE 10 MEQ/50ML IV SOLN
10.0000 meq | INTRAVENOUS | Status: AC
  Administered 2020-02-02 (×3): 10 meq via INTRAVENOUS
  Filled 2020-02-02 (×3): qty 50

## 2020-02-02 MED ORDER — MAGNESIUM SULFATE 2 GM/50ML IV SOLN
2.0000 g | Freq: Once | INTRAVENOUS | Status: AC
  Administered 2020-02-02: 2 g via INTRAVENOUS
  Filled 2020-02-02: qty 50

## 2020-02-02 NOTE — Progress Notes (Signed)
PHARMACY CONSULT NOTE  Pharmacy Consult for Electrolyte Monitoring and Replacement   Recent Labs: Potassium (mmol/L)  Date Value  02/02/2020 3.6   Magnesium (mg/dL)  Date Value  46/56/8127 1.6 (L)   Calcium (mg/dL)  Date Value  51/70/0174 9.0   Albumin (g/dL)  Date Value  94/49/6759 2.6 (L)   Phosphorus (mg/dL)  Date Value  16/38/4665 3.8   Sodium (mmol/L)  Date Value  02/02/2020 139   Assessment: 69 year old male transferred to ICU for acute toxic metabolic encephalopathy. Overnight 11/10, patient with two runs pulseless VT, with second episode requiring CPR and defibrillation. Converted back into baseline bundle branch rhythm with rate in 50's-60's.  Similar episodes 11/11 requiring defibrillation. Pharmacy to manage electrolytes.  Patient intubated 11/14 and extubated 11/20   Diuretics: IV Lasix 20 mg daily  Goal of Therapy:  Potassium 4.0 - 5.1 mmol/L Magnesium 2.0 - 2.4 mg/dL All other electrolytes within normal limits  Plan:   2 grams IV magnesium sulfate x 1 per MD  IV KCl 10 mEq x 3   Follow-up electrolytes with AM labs  Travis Maxwell  02/02/2020 8:42 AM

## 2020-02-02 NOTE — Plan of Care (Signed)
  Problem: Education: Goal: Ability to demonstrate management of disease process will improve Outcome: Not Progressing Goal: Ability to verbalize understanding of medication therapies will improve Outcome: Not Progressing   Problem: Activity: Goal: Capacity to carry out activities will improve Outcome: Not Progressing   Problem: Cardiac: Goal: Ability to achieve and maintain adequate cardiopulmonary perfusion will improve Outcome: Not Progressing   Problem: Education: Goal: Knowledge of General Education information will improve Description: Including pain rating scale, medication(s)/side effects and non-pharmacologic comfort measures Outcome: Not Progressing   Problem: Health Behavior/Discharge Planning: Goal: Ability to manage health-related needs will improve Outcome: Not Progressing   Problem: Clinical Measurements: Goal: Ability to maintain clinical measurements within normal limits will improve Outcome: Not Progressing Goal: Will remain free from infection Outcome: Not Progressing Goal: Diagnostic test results will improve Outcome: Not Progressing Goal: Respiratory complications will improve Outcome: Not Progressing Goal: Cardiovascular complication will be avoided Outcome: Not Progressing   Problem: Activity: Goal: Risk for activity intolerance will decrease Outcome: Not Progressing   Problem: Nutrition: Goal: Adequate nutrition will be maintained Outcome: Not Progressing   Problem: Coping: Goal: Level of anxiety will decrease Outcome: Not Progressing   Problem: Elimination: Goal: Will not experience complications related to bowel motility Outcome: Not Progressing Goal: Will not experience complications related to urinary retention Outcome: Not Progressing   Problem: Pain Managment: Goal: General experience of comfort will improve Outcome: Not Progressing   Problem: Safety: Goal: Ability to remain free from injury will improve Outcome: Not  Progressing   Problem: Skin Integrity: Goal: Risk for impaired skin integrity will decrease Outcome: Not Progressing   

## 2020-02-02 NOTE — Progress Notes (Signed)
PROGRESS NOTE  Travis Maxwell MBE:[id number] DOB: 10-29-1950 DOA: 01/06/2020 PCP: [facility]   LOS: 27 days   Brief narrative:  69 years old African-American male with past medical history of hypertension, diabetes mellitus type 2, hyperlipidemia, GERD, coronary artery disease status post PCI, history of aortic valve replacement on Eliquis presented to the hospital with respiratory distress chest pain and shortness of breath on 01/06/2020.  He also had orthopnea and leg swelling.  Initially was put on BiPAP but his breathing status worsened so he was emergently intubated and admitted to the hospital.  Patient was noted to be in severe cardiogenic shock on 01/07/2020.  Vasopressors were weaned off on 11/ 6 and patient was extubated to BiPAP at that time but patient was subsequently intubated on 01/19/2020.Marland Kitchen  Patient also had severe metabolic acidosis.  EKG showed some concerns for ST elevation but did not meet ST elevation MI criteria as per cardiology.  Patient received IV Cardizem and have a borderline elevated troponins.  BNP on admission was 1046.  Patient had a chest x-ray which showed vascular congestion.  Of note patient has history of Covid pneumonia on 12/04/2019.  Patient remained extubated and was off pressors on 01/29/20 and was subsequently considered for transfer to medical service.  Assessment/Plan:  Active Problems:   Acute on chronic systolic congestive heart failure (HCC)   Respiratory failure (HCC)   Malnutrition of moderate degree   Adult failure to thrive   Palliative care by specialist   DNR (do not resuscitate) discussion   Delirium   Pressure injury of skin   Ventricular tachycardia (HCC)  Poor oral intake  Patient has not had intake for more than a week.  Will consider for NG tube placement Enteral feeding.  Dietitian has been consulted.  Leukocytosis, thrombocytosis. Had MRSA and Pseudomonas pneumonia completed antibiotic 1123.  No fever.  Will  closely monitor.  acute hypoxic and hypercarbic  respiratory failure. Status post intubation and subsequent extubation.  Improved.  Currently on room air.   Patient had MRSA and Pseudomonas pneumonia and completed Zyvox and cefepime on 01/28/20.    Acute systolic congestive heart failure.  Nonischemic cardiomyopathy.  Last known ejection fraction of 20 to 25%.  Cardiology on board. Ptient underwent cardiac catheterization on 01/24/20 which showed nonobstructive disease with patent stents.  Patient might need AICD for secondary prevention, and LifeVest prior to discharge.  Continue IV Lasix, beta-blockers, Entresto  Cardiogenic shock status post ventricular tachycardia Patient underwent electric cardioversion.  Transiently required vasopressors.  Currently on IV amiodarone drip, IV heparin drip.   Continue aspirin, Lipitor, Coreg.  Keep potassium more than 4 and magnesium more than 2.  Paroxysmal atrial fibrillation.  Continue Coreg, amiodarone drip,, heparin drip due to difficulty with p.o..    Acute kidney injury.   Improved.  Monitor intake and output charting.  Creatinine of 0.8 at this time.   Acute metabolic encephalopathy with delirium,  Patient required Haldol, diazepam and Precedex drip during ICU.  On as needed Ativan and olanzapine but has not received any sedation since yesterday.  He appears to be very weak debilitated.was started on IV thiamine since yesterday.  There is a possibility of a hypoxic encephalopathy as well.  Will address nutrition through NG tube today.  MRSA and Pseudomonas pneumonia.  Tracheal culture 11/14 positive for MRSA and pseudomonas. Repeat culture 11/21 showed no growth. Antibiotic course completed 11/23  Dysphagia.  Will consider NG tube placement and enteral feeding at this time.  Mild hypomagnesemia.  Magnesium of 1.6.  Received magnesium sulfate 2 g this morning.  Add magnesium sulfate 2 g for the evening.  Check levels in a.m.  Goal to keep  magnesium level more than 2.  Patient with poor oral intake.  Pressure injury 01/20/2020.  Sacral stage II ulceration.  Continue wound care. Pressure Injury 01/20/20 Sacrum Posterior;Medial Stage 2 -  Partial thickness loss of dermis presenting as a shallow open injury with a red, pink wound bed without slough. (Active)  01/20/20 0800  Location: Sacrum  Location Orientation: Posterior;Medial  Staging: Stage 2 -  Partial thickness loss of dermis presenting as a shallow open injury with a red, pink wound bed without slough.  Wound Description (Comments):   Present on Admission: No   Ethics. patient has been seen by palliative care.  The decision was to proceed with current level of care.  DVT prophylaxis: SCDs Start: 01/06/20 1332, on heparin drip   Code Status: Full code  Family Communication:  I tried to reach the patient's daughters Travis Lei and Travis Maxwell on the phone today but was unable to reach all of them.  Spoke with the patient's significant other Travis Maxwell yesterday at bedside.  Overall prognosis of the patient is poor and guarded due to advanced cardiac condition arrhythmias encephalopathy.  Status is: Inpatient  Remains inpatient appropriate because:Unsafe d/c plan, IV treatments appropriate due to intensity of illness or inability to take PO and Inpatient level of care appropriate due to severity of illness, metabolic encephalopathy/need for skilled nursing facility placement, poor nutrition need for enteral feeding.   Dispo: The patient is from: Home              Anticipated d/c is to: Skilled nursing facility placement as per physical therapy recommendation.              Anticipated d/c date is: 2 to 3 days              Patient currently is not medically stable to d/c.   Consultants:  PCCM  Cardiology  Palliative care  Procedures:  NG tube placement 11/28  01/21/20 -2D echocardiogram:LVEF 20 to 25% with severely decreased function and severe global hypokinesis. Left  ventricle moderately dilated with mild left ventricular hypertrophy.Grade 1 diastolic dysfunction and elevated left atrial pressure.Left atrium mildly dilated mild mitral valve regurgitation.  01/24/20 - Right/Left Heart Cath:right heart catheterization showed mildly elevated filling pressures, minimal pulmonary hypertension and normal cardiac output previously placed proximal RCA to mid RCA stent is widely patent.No significant coronary artery disease overall.  Antibiotics:  . Completed course  Subjective:  Today, patient was seen and examined at bedside.  Nursing staff reported with minimal intake for more than a week.  No mention of nausea vomiting fever chills or extreme agitation overnight.  Has not received any sedation but appears to be somnolent.  Objective: Vitals:   02/02/20 1000 02/02/20 1100  BP: 118/83 129/71  Pulse: 65 (!) 127  Resp: 17 (!) 23  Temp:  (!) 96.7 F (35.9 C)  SpO2: 95% 96%    Intake/Output Summary (Last 24 hours) at 02/02/2020 1153 Last data filed at 02/02/2020 1142 Gross per 24 hour  Intake 1075.53 ml  Output 1450 ml  Net -374.47 ml   Filed Weights   01/28/20 0358 01/29/20 0400 01/30/20 0326  Weight: 71.9 kg 71.9 kg 72 kg   Body mass index is 24.85 kg/m.   Physical Exam: General: Patient is somnolent, mild response to verbal  stimuli, extremely deconditioned, weak. HENT:   No scleral pallor or icterus noted. Oral mucosa is moist.  Chest:    Diminished breath sounds bilaterally. Coarse breath sounds noted. CVS: S1 &S2 heard. No murmur.   Abdomen: Soft, nontender, nondistended.  Bowel sounds are heard.  External urinary catheter in place. Extremities: No cyanosis, clubbing or edema.  Peripheral pulses are palpable.  Right upper extremity PICC line in place Psych: Somnolent, CNS: Moving extremities. Skin: Warm and dry.  No rashes noted.   Data Review: I have personally reviewed the following laboratory data and studies,  CBC: Recent  Labs  Lab 02/23/20 0351 01/30/20 0400 01/31/20 0737 02/01/20 0250 02/02/20 0526  WBC 16.9* 15.6* 16.1* 18.3* 18.4*  NEUTROABS 11.1* 10.1* 10.8* 12.4* 12.7*  HGB 14.8 15.9 16.0 16.0 15.8  HCT 47.3 48.9 50.4 51.7 50.6  MCV 66.8* 67.0* 67.5* 68.8* 69.0*  PLT 985* 979* 919* 893* 868*   Basic Metabolic Panel: Recent Labs  Lab Feb 23, 2020 0351 2020/02/23 1800 01/30/20 0400 01/30/20 0800 01/30/20 1806 01/31/20 0409 01/31/20 0737 01/31/20 1744 02/01/20 0250 02/01/20 1404 02/02/20 0526  NA 137  --  138  --   --  138  --   --  140  --  139  K 3.5  --  3.7   < >  --  3.5  --  4.1 3.8 4.1 3.6  CL 106  --  107  --   --  104  --   --  109  --  107  CO2 19*  --  22  --   --  22  --   --  20*  --  22  GLUCOSE 139*  --  149*  --   --  265*  --   --  109*  --  121*  BUN 11  --  11  --   --  11  --   --  12  --  13  CREATININE 0.77  --  0.70  --   --  0.78  --   --  0.88  --  0.80  CALCIUM 8.3*  --  8.6*  --   --  8.4*  --   --  9.2  --  9.0  MG 2.1   < >  --    < >   < >  --  1.6* 1.8 2.1 1.8 1.6*  PHOS 2.6  --  3.0  --   --  3.2  --   --  3.6  --  3.8   < > = values in this interval not displayed.   Liver Function Tests: Recent Labs  Lab 01/31/20 0409  AST 18  ALT 12  ALKPHOS 66  BILITOT 0.8  PROT 6.9  ALBUMIN 2.6*   No results for input(s): LIPASE, AMYLASE in the last 168 hours. No results for input(s): AMMONIA in the last 168 hours. Cardiac Enzymes: No results for input(s): CKTOTAL, CKMB, CKMBINDEX, TROPONINI in the last 168 hours. BNP (last 3 results) Recent Labs    01/06/20 0821 01/13/20 0659 01/26/20 2339  BNP 1,046.8* 1,662.9* 1,886.4*    ProBNP (last 3 results) No results for input(s): PROBNP in the last 8760 hours.  CBG: Recent Labs  Lab 02/01/20 1949 02/01/20 2342 02/02/20 0513 02/02/20 0734 02/02/20 1117  GLUCAP 142* 152* 113* 139* 116*   Recent Results (from the past 240 hour(s))  Expectorated sputum assessment w rflx to resp cult     Status: None  Collection Time: 01/26/20 11:39 PM   Specimen: Expectorated Sputum  Result Value Ref Range Status   Specimen Description EXPECTORATED SPUTUM  Final   Special Requests NONE  Final   Sputum evaluation   Final    THIS SPECIMEN IS ACCEPTABLE FOR SPUTUM CULTURE Performed at [facility], [address], [place], [place] [address]    Report Status 01/27/2020 FINAL  Final  Culture, respiratory     Status: None   Collection Time: 01/26/20 11:39 PM  Result Value Ref Range Status   Specimen Description   Final    EXPECTORATED SPUTUM Performed at [facility], [address] [place], [place] [address]    Special Requests   Final    NONE Reflexed from [phone] Performed at [facility], [address] [place], [place] [address]    Gram Stain   Final    MODERATE SQUAMOUS EPITHELIAL CELLS PRESENT FEW WBC PRESENT, PREDOMINANTLY PMN RARE GRAM POSITIVE COCCI RARE GRAM NEGATIVE RODS    Culture   Final    NO GROWTH 2 DAYS Performed at [facility], [address], [place], [place] [address]    Report Status 01/29/2020 FINAL  Final     Studies: DG Abd 1 View  Result Date: 02/02/2020 CLINICAL DATA:  Dobbhoff placement EXAM: ABDOMEN - 1 VIEW COMPARISON:  01/20/2020 FINDINGS: Portable radiograph of the lower chest and upper abdomen was obtained for the purposes of enteric tube localization. A Dobbhoff tube is seen coursing below the diaphragm with distal tip terminating within the gastric fundus. Visualized bowel gas pattern is nonobstructive. Cardiomegaly with prosthetic cardiac valve. IMPRESSION: Dobbhoff tube terminates within the gastric fundus. Electronically Signed   By: Duanne Guess D.O.   On: 02/02/2020 11:02      Joycelyn Das, MD  [facility] 02/02/2020

## 2020-02-02 NOTE — Progress Notes (Signed)
Progress Note  Patient Name: Travis Maxwell Date of Encounter: 02/02/2020  [facility] Cardiologist: Lorine Bears, MD   Subjective   No complaints, minimally conversant but he is alert Discussed with nursing, was not taking medications but now after NG tube placed he is willing to take medications Telemetry with no significant arrhythmia  Inpatient Medications    Scheduled Meds: . allopurinol  300 mg Oral Daily  . aspirin  81 mg Oral Daily  . atorvastatin  40 mg Oral QHS  . carvedilol  6.25 mg Oral BID WC  . chlorhexidine  15 mL Mouth Rinse BID  . Chlorhexidine Gluconate Cloth  6 each Topical Daily  . docusate  100 mg Oral BID  . feeding supplement  237 mL Oral TID BM  . fentaNYL (SUBLIMAZE) injection  25 mcg Intravenous Once  . furosemide  20 mg Intravenous Daily  . insulin aspart  0-9 Units Subcutaneous Q4H  . mouth rinse  15 mL Mouth Rinse q12n4p  . polyethylene glycol  17 g Oral Daily  . sacubitril-valsartan  1 tablet Oral BID  . scopolamine  1 patch Transdermal Q72H  . senna-docusate  1 tablet Oral BID  . sodium chloride flush  10-40 mL Intracatheter Q12H  . sodium chloride flush  3 mL Intravenous Q12H  . spironolactone  25 mg Oral Daily  . thiamine injection  100 mg Intravenous Daily   Continuous Infusions: . sodium chloride 10 mL/hr at 02/02/20 1500  . amiodarone 30 mg/hr (02/02/20 1500)  . famotidine (PEPCID) IV Stopped (02/02/20 1017)  . heparin 1,200 Units/hr (02/02/20 1500)  . magnesium sulfate bolus IVPB     PRN Meds: sodium chloride, acetaminophen, albuterol, LORazepam, metoprolol tartrate, OLANZapine, ondansetron (ZOFRAN) IV, oxyCODONE, polyethylene glycol   Vital Signs    Vitals:   02/02/20 1200 02/02/20 1300 02/02/20 1400 02/02/20 1500  BP: (!) 133/109 128/84 121/83 126/74  Pulse: 74 78 77 83  Resp: (!) 21 (!) 26 (!) 24 (!) 25  Temp:      TempSrc:      SpO2: 95% 96% 92% 94%  Weight:      Height:        Intake/Output Summary (Last  24 hours) at 02/02/2020 1534 Last data filed at 02/02/2020 1515 Gross per 24 hour  Intake 1182.63 ml  Output 1200 ml  Net -17.37 ml   Last 3 Weights 01/30/2020 01/29/2020 01/28/2020  Weight (lbs) 158 lb 11.7 oz 158 lb 8.2 oz 158 lb 8.2 oz  Weight (kg) 72 kg 71.9 kg 71.9 kg      Telemetry    Normal sinus rhythm- Personally Reviewed  ECG     - Personally Reviewed  Physical Exam   GEN: No acute distress.   Neck: No JVD Cardiac: RRR, no murmurs, rubs, or gallops.  Respiratory: Clear to auscultation bilaterally. GI: Soft, nontender, non-distended  MS: No edema; No deformity. Neuro:  Nonfocal  Psych: Normal affect   Labs    High Sensitivity Troponin:   Recent Labs  Lab 01/09/20 0152 01/16/20 0245 01/16/20 0702 01/16/20 0838 01/19/20 0433  TROPONINIHS 89* 934* 711* 674* 234*      Chemistry Recent Labs  Lab 01/31/20 0409 01/31/20 1744 02/01/20 0250 02/01/20 1404 02/02/20 0526  NA 138  --  140  --  139  K 3.5   < > 3.8 4.1 3.6  CL 104  --  109  --  107  CO2 22  --  20*  --  22  GLUCOSE 265*  --  109*  --  121*  BUN 11  --  12  --  13  CREATININE 0.78  --  0.88  --  0.80  CALCIUM 8.4*  --  9.2  --  9.0  PROT 6.9  --   --   --   --   ALBUMIN 2.6*  --   --   --   --   AST 18  --   --   --   --   ALT 12  --   --   --   --   ALKPHOS 66  --   --   --   --   BILITOT 0.8  --   --   --   --   GFRNONAA >60  --  >60  --  >60  ANIONGAP 12  --  11  --  10   < > = values in this interval not displayed.     Hematology Recent Labs  Lab 01/31/20 0737 02/01/20 0250 02/02/20 0526  WBC 16.1* 18.3* 18.4*  RBC 7.47* 7.51* 7.33*  HGB 16.0 16.0 15.8  HCT 50.4 51.7 50.6  MCV 67.5* 68.8* 69.0*  MCH 21.4* 21.3* 21.6*  MCHC 31.7 30.9 31.2  RDW 28.1* 28.0* 28.9*  PLT 919* 893* 868*    BNP Recent Labs  Lab 01/26/20 2339  BNP 1,886.4*     DDimer No results for input(s): DDIMER in the last 168 hours.   Radiology    DG Abd 1 View  Result Date:  02/02/2020 CLINICAL DATA:  Dobbhoff placement EXAM: ABDOMEN - 1 VIEW COMPARISON:  01/20/2020 FINDINGS: Portable radiograph of the lower chest and upper abdomen was obtained for the purposes of enteric tube localization. A Dobbhoff tube is seen coursing below the diaphragm with distal tip terminating within the gastric fundus. Visualized bowel gas pattern is nonobstructive. Cardiomegaly with prosthetic cardiac valve. IMPRESSION: Dobbhoff tube terminates within the gastric fundus. Electronically Signed   By: Duanne Guess D.O.   On: 02/02/2020 11:02    Cardiac Studies   Heart catheterization January 24, 2020  Previously placed Prox RCA to Mid RCA stent (unknown type) is widely patent.  Prox RCA lesion is 50% stenosed.   1.  Patent stent in a nondominant right coronary artery.  No significant coronary artery disease overall. 2.  The replaced aortic valve was not crossed. 3.  Right heart catheterization showed mildly elevated filling pressures, minimal pulmonary hypertension and normal cardiac output.   Patient Profile     69 y.o.malew/ a h/o CAD s/p PCI to the RCA in 2019, tob abuse, DM, HFrEF, mixed ICM/NICM (EF<20% - out of proportion to degree of CAD), AI/AS s/p bioprosthetic AVR (03/2018), PAF on amio and eliquis prior to admission, HTN, HL, tob abuse, marijuana abuse, and gout who was admitted 11/1 w/ resp failure req intubation, pulm edema, hypotension req vasopressors with subsequent development of VT (s/p defib x 2  amio load. Briefly on lido).   Assessment & Plan   VT arrest Arriving with chest pain, shortness of breath November 1 Worsening respiratory distress in the ER, orthopnea Intubated for agitation, respiratory distress, pulling off his BiPAP Placed on pressors for hypotension, in the unit Echocardiogram confirming severe systolic dysfunction ejection fraction 20 to 25% Sustained frequent episodes of VT requiring defibrillation, antiarrhythmics including lidocaine,  amiodarone, evaluated by EP -On amiodarone infusion, arrhythmia much improved -He has been inconsistent with taking medications -Would look to have him take  oral amiodarone once he is taking medications consistently -Suspect he will not be a candidate for LifeVest .  We will treat medically  Acute on chronic systolic CHF Mixed ischemic and nonischemic cardiomyopathy Medications include beta-blocker, Entresto, spironolactone, Lasix IV -Continues to resist taking oral medications,  PAF Currently normal sinus rhythm on beta-blocker and amiodarone, We will hopefully transition from IV heparin to Eliquis once compliant with medications  Delirium Alert, continued confusion, is in mittens as he is pulling on items Has NG tube in place as he is not willing to take his medications Suspect possible anoxic injury given profound hypotension, VT this admission  Acute hypoxic respiratory failure MRSA, Pseudomonas pneumonia Was intubated for respiratory distress several weeks ago, extubated November 20   Total encounter time more than 25 minutes  Greater than 50% was spent in counseling and coordination of care with the patient   For questions or updates, please contact [facility] Please consult [email or web address] for contact info under        Signed, Julien Nordmann, MD  02/02/2020, 3:34 PM

## 2020-02-02 NOTE — Progress Notes (Signed)
ANTICOAGULATION CONSULT NOTE   Pharmacy Consult for heparin Indication: atrial fibrillation  Allergies  Allergen Reactions  . Penicillins Other (See Comments)    Has patient had a PCN reaction causing immediate rash, facial/tongue/throat swelling, SOB or lightheadedness with hypotension: Unknown Has patient had a PCN reaction causing severe rash involving mucus membranes or skin necrosis: Unknown Has patient had a PCN reaction that required hospitalization: Unknown Has patient had a PCN reaction occurring within the last 10 years: No If all of the above answers are "NO", then may proceed with Cephalosporin use.    Patient Measurements: Height: 5' 7.01" (170.2 cm) Weight: 72 kg (158 lb 11.7 oz) IBW/kg (Calculated) : 66.12 Heparin Dosing Weight: 67 kg  Vital Signs: Temp: 98.3 F (36.8 C) (11/28 0000) Temp Source: Oral (11/28 0000) BP: 134/106 (11/28 0300) Pulse Rate: 70 (11/28 0300)  Labs: Recent Labs    01/30/20 1115 01/31/20 0145 01/31/20 0409 01/31/20 0737 02/01/20 0250 02/02/20 0526  HGB  --   --   --  16.0 16.0  --   HCT  --   --   --  50.4 51.7  --   PLT  --   --   --  919* 893*  --   APTT  --  >160*  --  68* 85*  --   HEPARINUNFRC   < >  --   --  0.43 0.41 0.41  CREATININE  --   --  0.78  --  0.88 0.80   < > = values in this interval not displayed.    Estimated Creatinine Clearance: 83.8 mL/min (by C-G formula based on SCr of 0.8 mg/dL).   Medical History: Past Medical History:  Diagnosis Date  . CAD (coronary artery disease)   . Chronic systolic (congestive) heart failure (HCC)   . Diabetes mellitus without complication (HCC)   . GERD (gastroesophageal reflux disease)   . Gout   . Hx of aortic valve replacement    Bioprosthetic valve  . Hypertension    Assessment: 69 year old male with h/o CAD, HFrEF, aortic regurgitation and stenosis s/p bioprosthetic aortic valve, afib on Eliquis. Patient with episodes of ventricular tachycardia requiring  defibrillation multiple times. Patient has been on Eliquis here. Heart cath 11/19. Plan was started back on Apixaban but this morning (11/25) pt is unable to take PO and is being placed back on heparin drip. Last apixaban dose was 11/24 @2218 . Patient was previously therapeutic on 1100 units/hr then was subtherapeutic on 11/23 with HL 0.23 and plan was to increase rate to 1200 units/hr.   11/23 @ 0402 HL 0.35, therapeutic 11/24 @ 0351 HL 0.23, subtherapeutic. No problems w/ infusion per RN.  Will increase Heparin infusion to 1200 units/hr and recheck in 6 hrs  Goal of Therapy:  Heparin level 0.3-0.7 units/ml once HL and aPTT begin to correlate and if heparin is still continued aPTT 66-102 seconds Monitor platelets by anticoagulation protocol: Yes   Plan:  11/28 @ 0526 HL 0.41, therapeutic x 3 Will continue with current rate 1200 units/hr. Monitor CBC per protocol w/ heparin level daily  12/28, PharmD 02/02/2020 6:10 AM

## 2020-02-02 NOTE — Progress Notes (Addendum)
Shift summary:  - Patient inconsistent with PO meds.  - Refusing food and drink this AM. MD contacted to inquire about the potential of placing NGT and initiating enteral feeds.  - Mottling in toes, potentially due to poor cardiac output.   - Dobhoff placed by RN.   - RD consulted for Enteral feeds.

## 2020-02-02 NOTE — Progress Notes (Signed)
PT Cancellation Note  Patient Details Name: Travis Maxwell MRN: [id number] DOB: [birth year]   Cancelled Treatment:    Reason Eval/Treat Not Completed: Other (comment). Chart reviewed & nurse consulted. Nurse reports pt is not appropriate for therapy at this time, noting pt "is sedated". Will f/u per POC & when pt medically appropriate for PT intervention.  Aleda Grana, PT, DPT 02/02/20, 2:29 PM    Sandi Mariscal 02/02/2020, 2:28 PM

## 2020-02-02 NOTE — Progress Notes (Signed)
No acute events overnight, was agitated at beginning of shift, trying to come out of bed. I administered prn zyprexa and patient was able to rest and tolerated bipap most of the night.

## 2020-02-03 ENCOUNTER — Ambulatory Visit: Payer: Medicare HMO | Admitting: Family

## 2020-02-03 DIAGNOSIS — J9601 Acute respiratory failure with hypoxia: Secondary | ICD-10-CM | POA: Diagnosis not present

## 2020-02-03 DIAGNOSIS — I472 Ventricular tachycardia: Secondary | ICD-10-CM | POA: Diagnosis not present

## 2020-02-03 DIAGNOSIS — I4891 Unspecified atrial fibrillation: Secondary | ICD-10-CM | POA: Diagnosis not present

## 2020-02-03 DIAGNOSIS — R627 Adult failure to thrive: Secondary | ICD-10-CM | POA: Diagnosis not present

## 2020-02-03 DIAGNOSIS — I5023 Acute on chronic systolic (congestive) heart failure: Secondary | ICD-10-CM | POA: Diagnosis not present

## 2020-02-03 LAB — GLUCOSE, CAPILLARY
Glucose-Capillary: 113 mg/dL — ABNORMAL HIGH (ref 70–99)
Glucose-Capillary: 115 mg/dL — ABNORMAL HIGH (ref 70–99)
Glucose-Capillary: 119 mg/dL — ABNORMAL HIGH (ref 70–99)
Glucose-Capillary: 140 mg/dL — ABNORMAL HIGH (ref 70–99)
Glucose-Capillary: 146 mg/dL — ABNORMAL HIGH (ref 70–99)
Glucose-Capillary: 166 mg/dL — ABNORMAL HIGH (ref 70–99)
Glucose-Capillary: 174 mg/dL — ABNORMAL HIGH (ref 70–99)

## 2020-02-03 LAB — CBC WITH DIFFERENTIAL/PLATELET
Abs Immature Granulocytes: 0.1 10*3/uL — ABNORMAL HIGH (ref 0.00–0.07)
Basophils Absolute: 0.3 10*3/uL — ABNORMAL HIGH (ref 0.0–0.1)
Basophils Relative: 2 %
Eosinophils Absolute: 1.2 10*3/uL — ABNORMAL HIGH (ref 0.0–0.5)
Eosinophils Relative: 8 %
HCT: 51.1 % (ref 39.0–52.0)
Hemoglobin: 15.8 g/dL (ref 13.0–17.0)
Immature Granulocytes: 1 %
Lymphocytes Relative: 18 %
Lymphs Abs: 2.8 10*3/uL (ref 0.7–4.0)
MCH: 21.7 pg — ABNORMAL LOW (ref 26.0–34.0)
MCHC: 30.9 g/dL (ref 30.0–36.0)
MCV: 70.1 fL — ABNORMAL LOW (ref 80.0–100.0)
Monocytes Absolute: 0.9 10*3/uL (ref 0.1–1.0)
Monocytes Relative: 6 %
Neutro Abs: 10.3 10*3/uL — ABNORMAL HIGH (ref 1.7–7.7)
Neutrophils Relative %: 65 %
Platelets: 729 10*3/uL — ABNORMAL HIGH (ref 150–400)
RBC: 7.29 MIL/uL — ABNORMAL HIGH (ref 4.22–5.81)
RDW: 28.6 % — ABNORMAL HIGH (ref 11.5–15.5)
Smear Review: NORMAL
WBC: 15.6 10*3/uL — ABNORMAL HIGH (ref 4.0–10.5)
nRBC: 0 % (ref 0.0–0.2)

## 2020-02-03 LAB — HEPARIN LEVEL (UNFRACTIONATED)
Heparin Unfractionated: 0.25 IU/mL — ABNORMAL LOW (ref 0.30–0.70)
Heparin Unfractionated: 0.32 IU/mL (ref 0.30–0.70)
Heparin Unfractionated: 0.51 IU/mL (ref 0.30–0.70)

## 2020-02-03 LAB — BASIC METABOLIC PANEL
Anion gap: 10 (ref 5–15)
BUN: 13 mg/dL (ref 8–23)
CO2: 21 mmol/L — ABNORMAL LOW (ref 22–32)
Calcium: 8.8 mg/dL — ABNORMAL LOW (ref 8.9–10.3)
Chloride: 109 mmol/L (ref 98–111)
Creatinine, Ser: 0.89 mg/dL (ref 0.61–1.24)
GFR, Estimated: >60 mL/min (ref >60)
Glucose, Bld: 124 mg/dL — ABNORMAL HIGH (ref 70–99)
Potassium: 4.2 mmol/L (ref 3.5–5.1)
Sodium: 140 mmol/L (ref 135–145)

## 2020-02-03 LAB — MAGNESIUM
Magnesium: 1.9 mg/dL (ref 1.7–2.4)
Magnesium: 2.2 mg/dL (ref 1.7–2.4)

## 2020-02-03 LAB — PHOSPHORUS: Phosphorus: 3.4 mg/dL (ref 2.5–4.6)

## 2020-02-03 MED ORDER — OLANZAPINE 10 MG IM SOLR
5.0000 mg | Freq: Three times a day (TID) | INTRAMUSCULAR | Status: DC | PRN
  Administered 2020-02-03: 5 mg via INTRAMUSCULAR
  Filled 2020-02-03 (×2): qty 10

## 2020-02-03 MED ORDER — OSMOLITE 1.5 CAL PO LIQD
1000.0000 mL | ORAL | Status: DC
  Administered 2020-02-03 – 2020-02-04 (×2): 1000 mL

## 2020-02-03 MED ORDER — VITAL HIGH PROTEIN PO LIQD: 1000.0000 mL | ORAL | Status: DC

## 2020-02-03 MED ORDER — MAGNESIUM SULFATE 2 GM/50ML IV SOLN
2.0000 g | Freq: Once | INTRAVENOUS | Status: AC
  Administered 2020-02-03: 2 g via INTRAVENOUS
  Filled 2020-02-03: qty 50

## 2020-02-03 MED ORDER — PROSOURCE TF PO LIQD
45.0000 mL | Freq: Three times a day (TID) | ORAL | Status: DC
  Administered 2020-02-03 – 2020-02-06 (×6): 45 mL
  Filled 2020-02-03 (×11): qty 45

## 2020-02-03 MED ORDER — FREE WATER
30.0000 mL | Status: DC
  Administered 2020-02-03 – 2020-02-04 (×8): 30 mL

## 2020-02-03 NOTE — Progress Notes (Signed)
PROGRESS NOTE  Travis Maxwell YHC:[id number] DOB: 10-Aug-1950 DOA: 01/06/2020 PCP: [facility]   LOS: 28 days   Brief narrative:  69 years old African-American male with past medical history of hypertension, diabetes mellitus type 2, hyperlipidemia, GERD, coronary artery disease status post PCI, history of aortic valve replacement on Eliquis presented to the hospital with respiratory distress chest pain and shortness of breath on 01/06/2020.  He also had orthopnea and leg swelling.  Initially was put on BiPAP but his breathing status worsened so he was emergently intubated and admitted to the hospital.  Patient was noted to be in severe cardiogenic shock on 01/07/2020.  Vasopressors were weaned off on 11/ 6 and patient was extubated to BiPAP at that time but patient was subsequently intubated on 01/19/2020.  Patient also had severe metabolic acidosis.  EKG showed some concerns for ST elevation but did not meet ST elevation MI criteria as per cardiology.  Patient received IV Cardizem and have a borderline elevated troponins.  BNP on admission was 1046.  Patient had a chest x-ray which showed vascular congestion.  Of note patient has history of Covid pneumonia on 12/04/2019.  Patient remained extubated and was off pressors on 01/29/20 and was subsequently considered for transfer to medical service.  Patient has remained encephalopathic with a poor oral intake.  Patient was started on NG tube and tube feeding was started on 02/03/2020  Assessment/Plan:  Active Problems:   Acute on chronic systolic congestive heart failure (HCC)   Respiratory failure (HCC)   Malnutrition of moderate degree   Adult failure to thrive   Palliative care by specialist   DNR (do not resuscitate) discussion   Delirium   Pressure injury of skin   Ventricular tachycardia (HCC)  Poor oral intake with metabolic encephalopathy Status post NG tube placement.  Resume tube feeding.  Dietary on  board.  Leukocytosis, thrombocytosis. Had MRSA and Pseudomonas pneumonia completed antibiotic 11/23.  No fever.  Will closely monitor.  WBC trending down to 15.6 from 18.4.  Acute hypoxic and hypercarbic  respiratory failure. Status post intubation and subsequent extubation.   Patient had MRSA and Pseudomonas pneumonia and completed Zyvox and cefepime on 01/28/20.  Currently on room air.  Acute systolic congestive heart failure.  Nonischemic cardiomyopathy.  Last known ejection fraction of 20 to 25%.  Cardiology on board. Ptient underwent cardiac catheterization on 01/24/20 which showed nonobstructive disease with patent stents.  Patient might need AICD for secondary prevention, and LifeVest prior to discharge.  Currently on IV Lasix, beta-blockers, Entresto  Cardiogenic shock status post ventricular tachycardia Patient underwent electric cardioversion.    Currently on IV amiodarone drip for ventricular tachycardia.  Continue, IV heparin drip for anticoagulation..   Continue aspirin, Lipitor, Coreg.  Goal is to keep potassium more than 4 and magnesium more than 2.  Paroxysmal atrial fibrillation.  Continue Coreg, amiodarone drip,, heparin drip due to difficulty with p.o..    Acute kidney injury.   Improved.  Continue to monitor  intake and output charting.  Creatinine of 0.8 at this time.   Acute metabolic encephalopathy with delirium,  Patient required Haldol, diazepam and Precedex drip during ICU.  Currently very hypoactive and encephalopathic at this time.  Empirically thiamine has been initiated due to poor oral intake.  There is high possibility of hypoxic encephalopathy.  Will decrease the the dose of as needed olanzapine.  MRSA and Pseudomonas pneumonia.  Tracheal culture 11/14 positive for MRSA and pseudomonas. Repeat culture 11/21 showed  no growth. Antibiotic course completed 11/23  Dysphagia.  Considering NG tube placement and enteral feeding at this time.  Mild hypomagnesemia.   Magnesium of 1.6.  Received 4 g of IV magnesium yesterday.  We will continue to monitor.    Moderate malnutrition.  Seen by dietary today.  Will be started on tube feeding.  Pressure injury 01/20/2020.  Sacral stage II ulceration.  Continue wound care. Pressure Injury 01/20/20 Sacrum Posterior;Medial Stage 2 -  Partial thickness loss of dermis presenting as a shallow open injury with a red, pink wound bed without slough. (Active)  01/20/20 0800  Location: Sacrum  Location Orientation: Posterior;Medial  Staging: Stage 2 -  Partial thickness loss of dermis presenting as a shallow open injury with a red, pink wound bed without slough.  Wound Description (Comments):   Present on Admission: No   Ethics. patient has been seen by palliative care.  The decision was to proceed with current level of care.  DVT prophylaxis: SCDs Start: 01/06/20 1332, on heparin drip   Code Status: Full code  Family Communication: I was finally able to speak with the patient's daughter [place]en Sink at bedside.  Updated her about the clinical condition of the patient and overall guarded prognosis.  Status is: Inpatient  Remains inpatient appropriate because:Unsafe d/c plan, IV treatments appropriate due to intensity of illness or inability to take PO and Inpatient level of care appropriate due to severity of illness, metabolic encephalopathy/need for skilled nursing facility placement, poor nutrition and need for enteral feeding.   Dispo: The patient is from: Home              Anticipated d/c is to: Skilled nursing facility placement as per physical therapy recommendation.              Anticipated d/c date is: 2 to 3 days              Patient currently is not medically stable to d/c.   Consultants:  PCCM  Cardiology  Palliative care  Procedures:  NG tube placement 11/28  01/21/20 -2D echocardiogram:LVEF 20 to 25% with severely decreased function and severe global hypokinesis. Left ventricle moderately  dilated with mild left ventricular hypertrophy.Grade 1 diastolic dysfunction and elevated left atrial pressure.Left atrium mildly dilated mild mitral valve regurgitation.  01/24/20 - Right/Left Heart Cath:right heart catheterization showed mildly elevated filling pressures, minimal pulmonary hypertension and normal cardiac output previously placed proximal RCA to mid RCA stent is widely patent.No significant coronary artery disease overall.  Antibiotics:  . Completed course  Subjective:  Today, patient was seen and examined at bedside.  Appears to be slightly somnolent.  Following few commands including lifting his legs and arms.  Patient's daughter at bedside.  Objective: Vitals:   02/03/20 0700 02/03/20 0746  BP: 116/65   Pulse: 77   Resp: (!) 28   Temp:  97.8 F (36.6 C)  SpO2: 96%     Intake/Output Summary (Last 24 hours) at 02/03/2020 0807 Last data filed at 02/03/2020 0600 Gross per 24 hour  Intake 667.58 ml  Output 1550 ml  Net -882.42 ml   Filed Weights   01/28/20 0358 01/29/20 0400 01/30/20 0326  Weight: 71.9 kg 71.9 kg 72 kg   Body mass index is 24.85 kg/m.   Physical Exam:  General: Patient is deconditioned, debilitated, thinly built, somnolent, following few commands. HENT:   No scleral pallor or icterus noted. Oral mucosa is moist.  Chest:  Clear breath sounds.  Diminished breath  sounds bilaterally. No crackles or wheezes.  CVS: S1 &S2 heard. No murmur.  Regular rate and rhythm. Abdomen: Soft, nontender, nondistended.  Bowel sounds are heard.  External urinary catheter in place. Extremities: No cyanosis, clubbing or edema.  Peripheral pulses are palpable.  Right upper extremity PICC line in place. Psych:  somnolent, moving extremities,  CNS: Somnolent, moving extremities Skin: Warm and dry.  No rashes noted.    Data Review: I have personally reviewed the following laboratory data and studies,  CBC: Recent Labs  Lab 01/30/20 0400  01/31/20 0737 02/01/20 0250 02/02/20 0526 02/03/20 0551  WBC 15.6* 16.1* 18.3* 18.4* 15.6*  NEUTROABS 10.1* 10.8* 12.4* 12.7* 10.3*  HGB 15.9 16.0 16.0 15.8 15.8  HCT 48.9 50.4 51.7 50.6 51.1  MCV 67.0* 67.5* 68.8* 69.0* 70.1*  PLT 979* 919* 893* 868* 729*   Basic Metabolic Panel: Recent Labs  Lab 01/30/20 0400 01/30/20 0800 01/31/20 0409 01/31/20 0737 01/31/20 1744 01/31/20 1744 02/01/20 0250 02/01/20 1404 02/02/20 0526 02/02/20 1616 02/03/20 0551  NA 138  --  138  --   --   --  140  --  139  --  140  K 3.7   < > 3.5   < > 4.1  --  3.8 4.1 3.6  --  4.2  CL 107  --  104  --   --   --  109  --  107  --  109  CO2 22  --  22  --   --   --  20*  --  22  --  21*  GLUCOSE 149*  --  265*  --   --   --  109*  --  121*  --  124*  BUN 11  --  11  --   --   --  12  --  13  --  13  CREATININE 0.70  --  0.78  --   --   --  0.88  --  0.80  --  0.89  CALCIUM 8.6*  --  8.4*  --   --   --  9.2  --  9.0  --  8.8*  MG  --    < >  --    < > 1.8   < > 2.1 1.8 1.6* 1.8 1.9  PHOS 3.0  --  3.2  --   --   --  3.6  --  3.8  --  3.4   < > = values in this interval not displayed.   Liver Function Tests: Recent Labs  Lab 01/31/20 0409  AST 18  ALT 12  ALKPHOS 66  BILITOT 0.8  PROT 6.9  ALBUMIN 2.6*   No results for input(s): LIPASE, AMYLASE in the last 168 hours. No results for input(s): AMMONIA in the last 168 hours. Cardiac Enzymes: No results for input(s): CKTOTAL, CKMB, CKMBINDEX, TROPONINI in the last 168 hours. BNP (last 3 results) Recent Labs    01/06/20 0821 01/13/20 0659 01/26/20 2339  BNP 1,046.8* 1,662.9* 1,886.4*    ProBNP (last 3 results) No results for input(s): PROBNP in the last 8760 hours.  CBG: Recent Labs  Lab 02/02/20 1540 02/02/20 1935 02/03/20 0000 02/03/20 0406 02/03/20 0749  GLUCAP 181* 98 115* 140* 113*   Recent Results (from the past 240 hour(s))  Expectorated sputum assessment w rflx to resp cult     Status: None   Collection Time: 01/26/20  11:39 PM   Specimen: Expectorated Sputum  Result Value Ref Range Status   Specimen Description EXPECTORATED SPUTUM  Final   Special Requests NONE  Final   Sputum evaluation   Final    THIS SPECIMEN IS ACCEPTABLE FOR SPUTUM CULTURE Performed at [facility], [address] [place], [place] [address]    Report Status 01/27/2020 FINAL  Final  Culture, respiratory     Status: None   Collection Time: 01/26/20 11:39 PM  Result Value Ref Range Status   Specimen Description   Final    EXPECTORATED SPUTUM Performed at [facility], [address] [place], [place] [address]    Special Requests   Final    NONE Reflexed from [id number] Performed at [facility], [address] [place], [place] [address]    Gram Stain   Final    MODERATE SQUAMOUS EPITHELIAL CELLS PRESENT FEW WBC PRESENT, PREDOMINANTLY PMN RARE GRAM POSITIVE COCCI RARE GRAM NEGATIVE RODS    Culture   Final    NO GROWTH 2 DAYS Performed at [facility], [address] [place], [place] [address]    Report Status 01/29/2020 FINAL  Final     Studies: DG Abd 1 View  Result Date: 02/02/2020 CLINICAL DATA:  Dobbhoff placement EXAM: ABDOMEN - 1 VIEW COMPARISON:  01/20/2020 FINDINGS: Portable radiograph of the lower chest and upper abdomen was obtained for the purposes of enteric tube localization. A Dobbhoff tube is seen coursing below the diaphragm with distal tip terminating within the gastric fundus. Visualized bowel gas pattern is nonobstructive. Cardiomegaly with prosthetic cardiac valve. IMPRESSION: Dobbhoff tube terminates within the gastric fundus. Electronically Signed   By: Duanne Guess D.O.   On: 02/02/2020 11:02      Joycelyn Das, MD  [facility] 02/03/2020

## 2020-02-03 NOTE — Progress Notes (Signed)
OT Cancellation Note  Patient Details Name: Travis Maxwell MRN: [id number] DOB: [birth year]   Cancelled Treatment:    Reason Eval/Treat Not Completed: Fatigue/lethargy limiting ability to participate  Upon assessing this pt this AM, RN reports he has had Zyprexa as he has intermittently been delirious. OT attempts to rouse pt with both tactile and auditory stimuli. Pt with minimal eye opening, but instantly falls back asleep x3 attempts. Will f/u for OT tx at later date/time as able/appropriate. Thank you.  Rejeana Brock, MS, OTR/L ascom [phone] 02/03/20, 9:43 AM

## 2020-02-03 NOTE — Progress Notes (Signed)
CRITICAL CARE NOTE  [name] presenting to the ED on 01/06/2020 in respiratory distress, complaining of chest pain and shortness of breath. He also reported orthopnea and increased lower extremity swelling. History was limited due to acuity of condition and respiratory status. Patient was placed on biPAP, but progressively worsened and was emergently intubated. Patient is a current 0.5ppd smoker and uses marijuana. He has a severe metabolic acidosis. Patient has a past medical history significant for HTN, T2DM, HLD, GERD, CAD (s/p PCI), AVR with bioprosthetic valve, systolic CHF with EF of 70%, and paroxysmal A. Fib, currently anticoagulated on Eliquis. EKG upon arrival to ED revealed sinus rhythm at a rate of 99bpm with RBBB and PVCs with ST elevation in II, III, avF, V4-V6. Cardiologist on call stated that STEMI criteria were not met and advised to manage CHF. Patient received IV diltiazem and reported improvement of symptoms. Labs were notable for high sensitivity troponin 71->91->89. BNP 1046, creatinine 1.26, BUN 21, potassium 5.4. CXR showed improved pulmonary edema since prior study with chronic pulmonary venous hypertension. Notably, patient was admitted to [facility] ED 12/04/2019 for acute respiratory failure and acute on chronic systolic CHF in the setting of COVID pneumonia.   SIGNIFICANT EVENTS  11/02 - severe cardiogenic shock, remains on vent 11/03 - severe respiratory failure, severe systolic CHF 26/37 - patient remains critically ill 11/05 - patient remains critically ill 11/06 - off pressors, extubated to biPAP 11/07 - remains extubated, febrile, MSSA+ tracheal culture - started ancef 11/08 - remains extubated, on 2L by nasal cannula. Afebrile today with white count of 21.3K, up from 15.7K yesterday. Still encephalopathic and drowsy. On and off Precedex. Went into narrow complex SVT,then progressed to wide complex tachycardiawas shocked and given a loading dose of amiodarone. Will  remain on amiodarone gtt.Cardiology at bedside.  11/09 - remains extubated, on 2L by nasal cannula. Afebrile today with white count of 19.2K, down since yesterday. BP soft overnight, was started on Neo-synephrine infusion. More alert than yesterday, but only oriented to self. Failed Yale 3oz swallow study early this morning, started coughing at the end of the study. Will need to remain NPO until cleared by SLP. Patient has been educated on this, yet continues to incessantly ask for water.  11/10 - patient clinically improved. He is alert communicative and in no distress. He ate entire meal without issues. Still has confusion intermittently during interview. Frequent PVCs on telemetry.  Will initiate OT/PT today for downgrade planning. 11/11 - patient minimally symptomatic, he again had episodes of Torsades today s/p 120Jx1 mag 2g.  Poor prognosis overall with advanced CHF.  11/12 - Dicussed case with cardiology. Antiarrythmic agents being modified. Will also call consult for Electrophysiology  11/13 - Patient with no arrythmia overnight. He remains on precedex and neosynephrine.  I spoke with cardiology Dr Rayann Heman regarding EP evaluation- he recommended to continue with current medical therapy and have cardiologist call to EP on Monday to review options.  He has 1:1 sitter and is resting in bed comfortably. Spoke with Velva Harman POA daughter to update on poor prognosis worsening clinical condition.  11/14 - further deterioration overnight.  Patient required reintubation and MV.  Updated next of kin daughter Velva Harman this am and sent message to cardiology to discuss poor prognosis with family.    11/15 - remains intubated and sedated, high risk for cardiac arrest and death February 18, 2023 - remains intubated and sedated, frequent PVCs overnight, 2g magnesium given, awaiting EP consult, patient becomes tachypneic and diaphoretic during  SBTs 11/17 - increased ectopy, seen by EP who advised weaning lidocaine and restarting  amiodarone, planning for right & left heart cath on Friday 11/19 with Dr. Fletcher Anon, discontinue Eliquis, start IV heparin interim, tracheal culture 11/14 positive for MRSA and pseudomonas, started cefepime and linezolid 11/18 - remains intubated and sedated, on continuous infusion of amiodarone and lidocaine, metoprolol discontinued by cardiology due to low blood pressures, right/left heart cath scheduled for tomorrow 11/19 - pt s/p L&RHC no obstructive lesions, RHC with normal pressures. Patient will be treated medically with transition to PO amio and plan for SBT with attempt to liberate.  11/20 - Patient was liberated from ventilator.  He had persistent VT post extubation, was fortunate to have cardiologist present on time and had immediate medical intervention with improvement. Still very poor prognosis long term. Spoke to granddaugther at bedside requested family meeting to re-address goals of care due to limited therapy available for patient.  11/21 - Patient was able to stay off ventilator last night. He was started on Precedex overnight and is currently sedated to RASS-1 on nasal canula, plan to wean down/off sedation.  11/22 - Patient sitting up in bed, no obvious distress on BiPAP, no acute events overnight, attempt transition to nasal cannula, on NTG drip for BP control per cardiology until PO meds can be taken 11/23 - Patient required Haldol and Valium overnight for agitation, on 2L by Rudolph today, passed bedside swallow study this morning, can transition to PO meds, Foley removed 11/24 - Patient remains extubated and off pressors, breathing comfortably on room air, antibiotics completed yesterday, PT/OT today, fluctuating alertness, transitioned from IV amiodarone and heparin to PO amiodarone and Eliquis  11/29- patient is encephalopathic.  Working on swallow eval and PO meds to transition out of MICU    CC  Follow up respiratory failure  SUBJECTIVE Chronic ill appearing Prognosis is  guarded Confused, speech following Cardiology following   BP 90/76   Pulse (!) 47   Temp 97.8 F (36.6 C) (Axillary)   Resp (!) 27   Ht 5' 7.01" (1.702 m)   Wt 72 kg   SpO2 (!) 59%   BMI 24.85 kg/m    I/O last 3 completed shifts: In: 1199.6 [I.V.:866.8; NG/GT:60; IV Piggyback:272.8] Out: 1850 [Urine:1550; Emesis/NG output:300] Total I/O In: 532 [I.V.:532] Out: -   SpO2: (!) 59 % O2 Flow Rate (L/min): 2 L/min FiO2 (%): 28 %  Estimated body mass index is 24.85 kg/m as calculated from the following:   Height as of this encounter: 5' 7.01" (1.702 m).   Weight as of this encounter: 72 kg.   REVIEW OF SYSTEMS  ROS limited due toseverecritical illness. Respiratory:positive for intermittent cough,negative for shortness of breath Cardiovascular:negative for palpitations, chest pain, lower extremity swelling Gastrointestinal:negative for abdominal pain, nausea/vomiting Neurological:negative for lightheadedness, syncope, headaches  Pressure Injury 01/20/20 Sacrum Posterior;Medial Stage 2 -  Partial thickness loss of dermis presenting as a shallow open injury with a red, pink wound bed without slough. (Active)  01/20/20 0800  Location: Sacrum  Location Orientation: Posterior;Medial  Staging: Stage 2 -  Partial thickness loss of dermis presenting as a shallow open injury with a red, pink wound bed without slough.  Wound Description (Comments):   Present on Admission: No    PHYSICAL EXAMINATION:  GENERAL: Chronically ill appearing, NAD, conversational, difficult to understand HEAD: Normocephalic, atraumatic.  EYES: Pupils equal, round, reactive to light.  No scleral icterus.  MOUTH: Moist mucosal membrane. NECK: Supple. No JVD. PULMONARY:  Mildly tachypneic, unlabored, coarse breath sounds bilaterally CARDIOVASCULAR: S1 and S2. Regular rate and rhythm. No murmurs, rubs, or gallops.  GASTROINTESTINAL: Soft, nontender, non-distended.  Positive bowel sounds.    MUSCULOSKELETAL: No swelling, clubbing, or edema.  NEUROLOGIC: A&O x 1, follows simple commands, confused GCS<10 SKIN: Intact, warm, dry  MEDICATIONS: I have reviewed all medications and confirmed regimen as documented Scheduled Meds: . allopurinol  300 mg Oral Daily  . aspirin  81 mg Oral Daily  . atorvastatin  40 mg Oral QHS  . carvedilol  6.25 mg Oral BID WC  . chlorhexidine  15 mL Mouth Rinse BID  . Chlorhexidine Gluconate Cloth  6 each Topical Daily  . docusate  100 mg Oral BID  . feeding supplement (PROSource TF)  45 mL Per Tube TID  . fentaNYL (SUBLIMAZE) injection  25 mcg Intravenous Once  . free water  30 mL Per Tube Q4H  . furosemide  20 mg Intravenous Daily  . insulin aspart  0-9 Units Subcutaneous Q4H  . mouth rinse  15 mL Mouth Rinse q12n4p  . polyethylene glycol  17 g Oral Daily  . sacubitril-valsartan  1 tablet Oral BID  . senna-docusate  1 tablet Oral BID  . sodium chloride flush  10-40 mL Intracatheter Q12H  . sodium chloride flush  3 mL Intravenous Q12H  . spironolactone  25 mg Oral Daily  . thiamine injection  100 mg Intravenous Daily   Continuous Infusions: . sodium chloride Stopped (02/02/20 1732)  . amiodarone 30 mg/hr (02/03/20 1043)  . famotidine (PEPCID) IV 20 mg (02/03/20 0932)  . feeding supplement (OSMOLITE 1.5 CAL)    . heparin 1,350 Units/hr (02/03/20 0746)  . magnesium sulfate bolus IVPB     PRN Meds:.sodium chloride, acetaminophen, albuterol, LORazepam, metoprolol tartrate, OLANZapine, ondansetron (ZOFRAN) IV, oxyCODONE, polyethylene glycol   CULTURE RESULTS   Recent Results (from the past 240 hour(s))  Expectorated sputum assessment w rflx to resp cult     Status: None   Collection Time: 01/26/20 11:39 PM   Specimen: Expectorated Sputum  Result Value Ref Range Status   Specimen Description EXPECTORATED SPUTUM  Final   Special Requests NONE  Final   Sputum evaluation   Final    THIS SPECIMEN IS ACCEPTABLE FOR SPUTUM CULTURE Performed  at [facility], [address], [place], [place] [address]    Report Status 01/27/2020 FINAL  Final  Culture, respiratory     Status: None   Collection Time: 01/26/20 11:39 PM  Result Value Ref Range Status   Specimen Description   Final    EXPECTORATED SPUTUM Performed at [facility], [address] [place], [place] [address]    Special Requests   Final    NONE Reflexed from [id number] Performed at [facility], [address] [place], [place] [address]    Gram Stain   Final    MODERATE SQUAMOUS EPITHELIAL CELLS PRESENT FEW WBC PRESENT, PREDOMINANTLY PMN RARE GRAM POSITIVE COCCI RARE GRAM NEGATIVE RODS    Culture   Final    NO GROWTH 2 DAYS Performed at [facility], [place] [address], [place], [place] [address]    Report Status 01/29/2020 FINAL  Final    LABS  CBC Latest Ref Rng & Units 02/03/2020 02/02/2020 02/01/2020  WBC 4.0 - 10.5 K/uL 15.6(H) 18.4(H) 18.3(H)  Hemoglobin 13.0 - 17.0 g/dL 15.8 15.8 16.0  Hematocrit 39 - 52 % 51.1 50.6 51.7  Platelets 150 - 400 K/uL 729(H) 868(H)  893(H)   BMP Latest Ref Rng & Units 02/03/2020 02/02/2020 02/01/2020  Glucose 70 - 99 mg/dL 124(H) 121(H) -  BUN 8 - 23 mg/dL 13 13 -  Creatinine 0.61 - 1.24 mg/dL 0.89 0.80 -  Sodium 135 - 145 mmol/L 140 139 -  Potassium 3.5 - 5.1 mmol/L 4.2 3.6 4.1  Chloride 98 - 111 mmol/L 109 107 -  CO2 22 - 32 mmol/L 21(L) 22 -  Calcium 8.9 - 10.3 mg/dL 8.8(L) 9.0 -    Intake/Output Summary (Last 24 hours) at 02/03/2020 1511 Last data filed at 02/03/2020 0800 Gross per 24 hour  Intake 663.83 ml  Output 1000 ml  Net -336.17 ml    IMAGING    No results found.  SIGNIFICANT DIAGNOSTIC TESTS  01/21/20 - Echocardiogram: LVEF 20 to 25% with severely decreased function and severe global hypokinesis. Left ventricle moderately dilated with mild left ventricular hypertrophy.Grade 1 diastolic dysfunction and elevated left atrial pressure.Left atrium mildly  dilated mild mitral valve regurgitation. 01/24/20 - Right/Left Heart Cath: right heart catheterization showed mildly elevated filling pressures, minimal pulmonary hypertension and normal cardiac output previously placed proximal RCA to mid RCA stent is widely patent.No significant coronary artery disease overall.   Nutrition Status: Nutrition Problem: Moderate Malnutrition Etiology: chronic illness (CHF) Signs/Symptoms: moderate fat depletion, moderate muscle depletion Interventions: Tube feeding, MVI       Central Line/ continued, requirement due to  Reason to continue [employer] Monitoring of central venous pressure or other hemodynamic parameters and poor IV access     ASSESSMENT AND PLAN  [name] with severe respiratory failure due to metabolic acidosis with severe systolic CHF exacerbation, previous history of COVID-19 in the setting of drug abuse, undergoing therapy for HCAP, complicated by tachyarrhythmia and progressive heart failure. Patient with recurrent respiratory failure requiring intubation. Remains extubated since 11/20.  Severe ACUTE Hypoxic and Hypercapnic Respiratory Failure - Extubated 11/20, monitor secretions - Continue supplemental O2 to maintain SpO2 >90%, BiPAP as needed - MRSA and Pseudomonas pneumonia - c/w zyvox and cefepime - abx completed 11/23 - Palliative care following  ACUTE SYSTOLIC CARDIAC FAILURE - EF 20-25% likely non-ischemic cardiomyopathy, related to ETOH and drug abuse  - Oxygen as needed - Lasix as tolerated - Follow up cardiac enzymes as indicated - Second opinion from [facility] --> recommending repeat echo and cardiac catheterization - Right and Left Heart Cath 11/19 with Dr. Fletcher Anon, discontinue Eliquis, initiate IV heparin interim - R&LHC showed non-obstructive disease with patent stents, may need AICD for secondary prevention - Passed swallow study 11/23 - resume PO cardiac meds  - Cardiology following - appreciate input - Per  cardiology will need LifeVest at discharge with f/u EP outpatient for AICD evaluation  SHOCK-CARDIOGENIC due to tachyarrhythmias - S/p electrical cardioversion - Use vasopressors as needed to keep MAP > 65 - IV amiodarone discontinued due to torsades --> reassessed by EP 11/16, no past evidence of torsades - Transitioned from IV lidocaine back to IV amiodarone per EP Dr. Quentin Ore - Weaned off lidocaine after cardiac catheterization 11/19 - Per cardiology: maintain potassium > 4.0 and magnesium > 2.0 - Passed swallow study 11/23 - still on IV amiodarone and heparin - Transition back to PO amiodarone and Eliquis prior to discharge  ACUTE KIDNEY INJURY/Renal Failure - Continue Foley Catheter - assess need - Avoid nephrotoxic agents - Follow urine output, BMP - Ensure adequate renal perfusion, optimize oxygenation - Renal dose medications  NEUROLOGY - Acute metabolic encephalopathy, need for sedation -  Continue Air cabin crew - Previously requiring precedex drip, haldol, diazepam PRN for agitation/anxiety - Start scheduled Zyprexa daily at bedtime  CARDIAC - ICU monitoring - Telemetry showing sinus rhythm with PVCs  ID  - Tracheal culture 11/14 positive for MRSA and pseudomonas - Repeat culture 11/21 showed no growth - Antibiotic course completed 11/23  GI - GI PROPHYLAXIS as indicated - DIET --> Dysphagia 2 (fine chop); thin liquid - Constipation protocol as indicated  ENDO - Will use ICU hypoglycemic\Hyperglycemia protocol if indicated  ELECTROLYTES - Follow labs as needed - Replace as needed - Pharmacy consultation and following   DVT/GI PRX ordered and assessed TRANSFUSIONS AS NEEDED MONITOR FSBS I Assessed the need for Labs I Assessed the need for Foley I Assessed the need for Central Venous Line Family Discussion when available I Assessed the need for Mobilization I made an Assessment of medications to be adjusted accordingly Safety Risk assessment  completed    Critical care provider statement:    Critical care time (minutes):  33   Critical care time was exclusive of:  Separately billable procedures and  treating other patients   Critical care was necessary to treat or prevent imminent or  life-threatening deterioration of the following conditions:  Encephalopathy, CHF exacerbation, Ventricular fibrillation   Critical care was time spent personally by me on the following  activities:  Development of treatment plan with patient or surrogate,  discussions with consultants, evaluation of patient's response to  treatment, examination of patient, obtaining history from patient or  surrogate, ordering and performing treatments and interventions, ordering  and review of laboratory studies and re-evaluation of patient's condition   I assumed direction of critical care for this patient from another  provider in my specialty: no     Ottie Glazier, M.D.  [facility]

## 2020-02-03 NOTE — Progress Notes (Signed)
Physical Therapy Treatment Patient Details Name: Travis Maxwell MRN: [id number] DOB: Mar 24, 1950 Today's Date: 02/03/2020    History of Present Illness presented to ER secondary to CP, SOB; admitted for mangement of severe acute hypoxic and hypercapnic respiratory failure, acute toxic metabolic encephalopathy and cardiogenic shock.  Hospital course significacnt for intubation 11/1-11/6, 11/14-11/20, currently on RA; L/R heart cath (11/19) with no obstuctive lesions noted, EF approx 20%.  Recommended for lifevest at DC with further evaluation for AICD placement..    PT Comments    Pt is making limited progress towards goals with impaired cognition throughout session and waxing and waning of alertness level. Pt initially awake, however fatigues quickly throughout session and then ended up falling asleep completely. Will try to progress as tolerated.   Follow Up Recommendations  SNF     Equipment Recommendations       Recommendations for Other Services       Precautions / Restrictions Precautions Precautions: Fall Restrictions Weight Bearing Restrictions: No    Mobility  Bed Mobility               General bed mobility comments: unable to perform secondary to lethargy/cognition  Transfers                    Ambulation/Gait                 Stairs             Wheelchair Mobility    Modified Rankin (Stroke Patients Only)       Balance                                            Cognition Arousal/Alertness: Lethargic (waxes and wanes throughout session)                                     General Comments: initially alert in chair position in bed. Throughout session, becomes lethargic and eventually falls asleep on his hands at end of session      Exercises Other Exercises Other Exercises: pt in chair position for ther-ex. Pt lethargic and falling asleep with verbal cues for alertness. Performed LAQ,  AP, SLRs, and hip abd/add. Only able to perform ~5-6 on own then needs significant assist due to fatigue. Unable to keep patient awake Other Exercises: assisted pt with drinking organge juice in hopes of stimulation.    General Comments        Pertinent Vitals/Pain Pain Assessment: No/denies pain    Home Living                      Prior Function            PT Goals (current goals can now be found in the care plan section) Acute Rehab PT Goals Patient Stated Goal: none stated PT Goal Formulation: Patient unable to participate in goal setting Time For Goal Achievement: 02/12/20 Potential to Achieve Goals: Fair Progress towards PT goals: Progressing toward goals    Frequency    Min 2X/week      PT Plan Current plan remains appropriate    Co-evaluation              AM-PAC PT "6 Clicks" Mobility   Outcome Measure  Help needed  turning from your back to your side while in a flat bed without using bedrails?: A Lot Help needed moving from lying on your back to sitting on the side of a flat bed without using bedrails?: A Lot Help needed moving to and from a bed to a chair (including a wheelchair)?: Total Help needed standing up from a chair using your arms (e.g., wheelchair or bedside chair)?: Total Help needed to walk in hospital room?: Total Help needed climbing 3-5 steps with a railing? : Total 6 Click Score: 8    End of Session   Activity Tolerance: Patient limited by fatigue Patient left: in bed;with bed alarm set;with call bell/phone within reach Nurse Communication: Mobility status PT Visit Diagnosis: Muscle weakness (generalized) (M62.81);Difficulty in walking, not elsewhere classified (R26.2);Other symptoms and signs involving the nervous system (R51.884)     Time: 1660-6301 PT Time Calculation (min) (ACUTE ONLY): 14 min  Charges:  $Therapeutic Exercise: 8-22 mins                     Elizabeth Palau, PT,  DPT [phone]    Travis Maxwell 02/03/2020, 4:40 PM

## 2020-02-03 NOTE — Progress Notes (Signed)
ANTICOAGULATION CONSULT NOTE   Pharmacy Consult for heparin Indication: atrial fibrillation  Allergies  Allergen Reactions   Penicillins Other (See Comments)    Has patient had a PCN reaction causing immediate rash, facial/tongue/throat swelling, SOB or lightheadedness with hypotension: Unknown Has patient had a PCN reaction causing severe rash involving mucus membranes or skin necrosis: Unknown Has patient had a PCN reaction that required hospitalization: Unknown Has patient had a PCN reaction occurring within the last 10 years: No If all of the above answers are "NO", then may proceed with Cephalosporin use.    Patient Measurements: Height: 5' 7.01" (170.2 cm) Weight: 72 kg (158 lb 11.7 oz) IBW/kg (Calculated) : 66.12 Heparin Dosing Weight: 67 kg  Vital Signs: BP: 120/87 (11/29 0100) Pulse Rate: 73 (11/29 0100)  Labs: Recent Labs    01/31/20 0737 01/31/20 0737 02/01/20 0250 02/02/20 0526 02/03/20 0551  HGB 16.0   < > 16.0 15.8  --   HCT 50.4  --  51.7 50.6  --   PLT 919*  --  893* 868*  --   APTT 68*  --  85*  --   --   HEPARINUNFRC 0.43   < > 0.41 0.41 0.25*  CREATININE  --   --  0.88 0.80 0.89   < > = values in this interval not displayed.    Estimated Creatinine Clearance: 75.3 mL/min (by C-G formula based on SCr of 0.89 mg/dL).   Medical History: Past Medical History:  Diagnosis Date   CAD (coronary artery disease)    Chronic systolic (congestive) heart failure (HCC)    Diabetes mellitus without complication (HCC)    GERD (gastroesophageal reflux disease)    Gout    Hx of aortic valve replacement    Bioprosthetic valve   Hypertension    Assessment: 69 year old male with h/o CAD, HFrEF, aortic regurgitation and stenosis s/p bioprosthetic aortic valve, afib on Eliquis. Patient with episodes of ventricular tachycardia requiring defibrillation multiple times. Patient has been on Eliquis here. Heart cath 11/19. Plan was started back on Apixaban but  this morning (11/25) pt is unable to take PO and is being placed back on heparin drip. Last apixaban dose was 11/24 @2218 . Patient was previously therapeutic on 1100 units/hr then was subtherapeutic on 11/23 with HL 0.23 and plan was to increase rate to 1200 units/hr.   11/23 @ 0402 HL 0.35, therapeutic 11/24 @ 0351 HL 0.23, subtherapeutic. No problems w/ infusion per RN.  Will increase Heparin infusion to 1200 units/hr and recheck in 6 hrs  Goal of Therapy:  Heparin level 0.3-0.7 units/ml once HL and aPTT begin to correlate and if heparin is still continued aPTT 66-102 seconds Monitor platelets by anticoagulation protocol: Yes   Plan:  11/29 @ 0551 HL 0.25, SUBtherapeutic.  Will increase Heparin rate to 1350 units/hr and recheck HL ~ 6 hours after rate increase. Monitor CBC daily.   12/29, PharmD 02/03/2020 6:31 AM

## 2020-02-03 NOTE — Progress Notes (Signed)
ANTICOAGULATION CONSULT NOTE   Pharmacy Consult for heparin Indication: atrial fibrillation  Patient Measurements: Height: 5' 7.01" (170.2 cm) Weight: 72 kg (158 lb 11.7 oz) IBW/kg (Calculated) : 66.12 Heparin Dosing Weight: 67 kg  Vital Signs: Temp: 97.8 F (36.6 C) (11/29 0746) Temp Source: Axillary (11/29 0746) BP: 90/76 (11/29 1300) Pulse Rate: 47 (11/29 1300)  Labs: Recent Labs    02/01/20 0250 02/01/20 0250 02/02/20 0526 02/03/20 0551  HGB 16.0   < > 15.8 15.8  HCT 51.7  --  50.6 51.1  PLT 893*  --  868* 729*  APTT 85*  --   --   --   HEPARINUNFRC 0.41  --  0.41 0.25*  CREATININE 0.88  --  0.80 0.89   < > = values in this interval not displayed.    Estimated Creatinine Clearance: 75.3 mL/min (by C-G formula based on SCr of 0.89 mg/dL).   Medical History: Past Medical History:  Diagnosis Date  . CAD (coronary artery disease)   . Chronic systolic (congestive) heart failure (HCC)   . Diabetes mellitus without complication (HCC)   . GERD (gastroesophageal reflux disease)   . Gout   . Hx of aortic valve replacement    Bioprosthetic valve  . Hypertension    Assessment: 69 year old male with h/o CAD, HFrEF, aortic regurgitation and stenosis s/p bioprosthetic aortic valve, afib on Eliquis. Patient with episodes of ventricular tachycardia requiring defibrillation multiple times. Patient has been on Eliquis here. Heart cath 11/19. Plan was started back on Apixaban but this morning (11/25) pt is unable to take PO and is being placed back on heparin drip. Last apixaban dose was 11/24 2218. H&H wnl and stable, platelets elevated but trending down.  Goal of Therapy:  Heparin level 0.3-0.7 units/ml  Monitor platelets by anticoagulation protocol: Yes   Plan:   Anti-Xa level therapeutic   Continue heparin infusion at 1350 units/hr  recheck level in 6 hours  Monitor CBC daily.   Lowella Bandy, PharmD 02/03/2020 3:08 PM

## 2020-02-03 NOTE — Progress Notes (Signed)
ANTICOAGULATION CONSULT NOTE   Pharmacy Consult for heparin Indication: atrial fibrillation  Patient Measurements: Height: 5' 7.01" (170.2 cm) Weight: 72 kg (158 lb 11.7 oz) IBW/kg (Calculated) : 66.12 Heparin Dosing Weight: 67 kg  Vital Signs: Temp: 98.7 F (37.1 C) (11/29 1600) Temp Source: Oral (11/29 1600) BP: 112/72 (11/29 1700) Pulse Rate: 85 (11/29 1600)  Labs: Recent Labs    02/01/20 0250 02/01/20 0250 02/02/20 0526 02/02/20 0526 02/03/20 0551 02/03/20 1502 02/03/20 2157  HGB 16.0   < > 15.8  --  15.8  --   --   HCT 51.7  --  50.6  --  51.1  --   --   PLT 893*  --  868*  --  729*  --   --   APTT 85*  --   --   --   --   --   --   HEPARINUNFRC 0.41   < > 0.41   < > 0.25* 0.32 0.51  CREATININE 0.88  --  0.80  --  0.89  --   --    < > = values in this interval not displayed.    Estimated Creatinine Clearance: 75.3 mL/min (by C-G formula based on SCr of 0.89 mg/dL).   Medical History: Past Medical History:  Diagnosis Date  . CAD (coronary artery disease)   . Chronic systolic (congestive) heart failure (HCC)   . Diabetes mellitus without complication (HCC)   . GERD (gastroesophageal reflux disease)   . Gout   . Hx of aortic valve replacement    Bioprosthetic valve  . Hypertension    Assessment: 69 year old male with h/o CAD, HFrEF, aortic regurgitation and stenosis s/p bioprosthetic aortic valve, afib on Eliquis. Patient with episodes of ventricular tachycardia requiring defibrillation multiple times. Patient has been on Eliquis here. Heart cath 11/19. Plan was started back on Apixaban but this morning (11/25) pt is unable to take PO and is being placed back on heparin drip. Last apixaban dose was 11/24 2218. H&H wnl and stable, platelets elevated but trending down.  Goal of Therapy:  Heparin level 0.3-0.7 units/ml  Monitor platelets by anticoagulation protocol: Yes   Plan:   11/29:  HL @ 2200 = 0.51, HL therapeutic X 2  Will recheck HL on 11/30 with  AM labs.   Dyamond Tolosa D, PharmD 02/03/2020 10:34 PM

## 2020-02-03 NOTE — Evaluation (Addendum)
Clinical/Bedside Swallow Evaluation Patient Details  Name: Yaroslav Gombos MRN: [id number] Date of Birth: 10-Aug-1950  Today's Date: 02/03/2020 Time: SLP Start Time (ACUTE ONLY): 1330 SLP Stop Time (ACUTE ONLY): 1430 SLP Time Calculation (min) (ACUTE ONLY): 60 min  Past Medical History:  Past Medical History:  Diagnosis Date   CAD (coronary artery disease)    Chronic systolic (congestive) heart failure (HCC)    Diabetes mellitus without complication (HCC)    GERD (gastroesophageal reflux disease)    Gout    Hx of aortic valve replacement    Bioprosthetic valve   Hypertension    Past Surgical History:  Past Surgical History:  Procedure Laterality Date   CORONARY STENT INTERVENTION N/A 07/25/2017   Procedure: CORONARY STENT INTERVENTION;  Surgeon: Alwyn Pea, MD;  Location: [facility] INVASIVE CV [facility];  Service: Cardiovascular;  Laterality: N/A;   LEFT HEART CATH AND CORONARY ANGIOGRAPHY N/A 07/25/2017   Procedure: LEFT HEART CATH AND CORONARY ANGIOGRAPHY;  Surgeon: Lamar Blinks, MD;  Location: [facility] INVASIVE CV LAB;  Service: Cardiovascular;  Laterality: N/A;   RIGHT/LEFT HEART CATH AND CORONARY ANGIOGRAPHY N/A 01/24/2020   Procedure: RIGHT/LEFT HEART CATH AND CORONARY ANGIOGRAPHY;  Surgeon: Iran Ouch, MD;  Location: [facility] INVASIVE CV [facility];  Service: Cardiovascular;  Laterality: N/A;   HPI:  69 year old male status post COVID-19 infection 9/21 presenting with acute hypoxic respiratory failure and chest pain failing BiPAP requiring emergent intubation and mechanical ventilation admitted to the ICU. Over the course of 22 days, pt has required several intubations with most recent intuabtion 11/14 until 11/20, transitioned to BIPAP and was weaned to nasal cannula on 11/22. Pt's initial Bedside Swallow Evaluation was performed on 01/14/2020 with recommendation of dysphagia 1 with thin liquids. Pt currently NPO.   Assessment / Plan / Recommendation Clinical Impression  Pt  was seen bedside in ICU for clinical swallow evaluation; this is a repeat BSE (previous assessment completed on 01/14/2019, 01/28/2019). Previous oral diets have been puree-minced in consistency w/ thin liquids w/ general aspiration precautions. Pt continues to exhibit declined Cognitive/Mental Status awareness w/ refusal of po's including meds at times w/ NSG. Pt currently has an NG in place for nutritional support d/t FTT issues.  Pt appears to present with grossly adequate oropharyngeal phase swallow function when consuming trials of puree and thin liquids via cup w/ support. Pt continues to be confused, w/ apparent delayed response times and reduced ability to follow directions. Pt intermittently follows simple 1-step directions at this time w/ Mod cues. Pt can also be difficult redirect to task at hand d/t apparent fatigue/confusion. Pt was NPO status prior to eval d/t concern for delirium over the weekend per NSG, notes. Pt's daughter has been requesting he have water now that he is "more awake", per notes. Mitts/restraints in place; R-hand restraint removed for ease of self-feeding during evaluation. Pt attempted verbalizations but was difficult to understand d/t decreased vocal intensity at this time. Pt has a protruding lower lip at Baseline; drooling often occurs per NSG.  Cursory Oral Mech exam reveals min+ oral phase weakness in general(inattention to task), and edentulous status at Baseline. Pt appears w/ increased orapharyngeal wetness intermittently & decreased ability to manage secretions/prevent anterior spillage of saliva prior to POs. NSG endorsed he has presented this way at Baseline w/out po's. Of note, any increased orapharyngeal secretions/decreased ability to manage secretions can increased risk of laryngeal penetration/aspiration. No unilateral weakness noted; lower labial protrusion Baseline for pt, and possibly contributing to decreased management of  secretions. Speech mumbled, suspect  min Baseline; intermittently clear w/ pt increased effort/energy/focus to speaking. Pt vocal intensity appears reduced baseline, possibly s/p prolonged intubation.  During po trials, pt consumed PO trials given this eval w/ No overt, immediate clinical s/s of aspiration noted during po trials. He consumed 8x ice chips via spoon, 8x thin liquids VIA CUP, and 8x puree consistencies w/ no overt decline in vocal quality, or change in respiratory presentation during/post trials. Pt helped to Hold Cup to drink w/ support. Delayed min cough noted following initial trial w/ ice chips (after 1x ice chip), and 2x while drinking thin liquids via cup. Cough appears adequate in strength for clearance of any potential aspirate. No noted increase in respiratory effort following coughing episodes, nor decline in O2 sats during/post trials. Pulse oximeter was not present initially; no overt changes overall of respiratory presentation noted: No increased WOB breathing or distress noted during/post trials. Oral phase appeared grossly [facility] w/ timely bolus management and control of bolus propulsion for A-P transfer for swallowing post delivery/acceptance of po trials. Pt exhibited Min+ decreased awareness of the po trials during the oral prep stage and required increased verbal/tactile cues during this stage-- min impulsive w/ bolus intake w/ thin liquids. Oral clearing achieved w/ trial consistencies when assessed b/t trials.   Recommend Dysphagia 1 diet (puree) w/ moistened foods; Thin liquidsVIA CUP.  Recommend general aspiration precautions, Pills Crushed in Puree for safer, easier swallowing. NSG to assist w/ feeding and helping pt to hold cup to drink at meals. Recommend monitoring O2 sats during oral intake as well as any overt clinical s/s of aspiration. Pt should ONLY receive PO's when alert, attentive, & calm for safer swallwoing. ST services to f/u next 1-2 days for ongoing toleration of diet and trials to upgrade when  appropriate. NSG & MD updated. Education provided & posted in room.    Recommend Dietician f/u for nutritional support -- pt may need more long term nutritional support for any FTT issues at hand.      SLP Visit Diagnosis: Dysphagia, oral phase (R13.11)    Aspiration Risk  Mild aspiration risk but reduced w/ general aspiration precautions   Diet Recommendation  Dysphagia 1 diet (puree) w/ moistened foods; Thin liquidsVIA CUP.  Recommend general aspiration precautions. Feeding assistance and monitoring at ALL meals - must be awake and calm.  Medication Administration: Crushed with puree    Other  Recommendations Oral Care Recommendations: Oral care BID   Follow up Recommendations None      Frequency and Duration min 2x/week  1 week       Prognosis Prognosis for Safe Diet Advancement: Fair Barriers to Reach Goals: Motivation;Time post onset;Severity of deficits;Behavior;Cognitive deficits      Swallow Study   General Date of Onset: 01/06/20 HPI: 69 year old male status post COVID-19 infection 9/21 presenting with acute hypoxic respiratory failure and chest pain failing BiPAP requiring emergent intubation and mechanical ventilation admitted to the ICU. Over the course of 22 days, pt has required several intubations with most recent intuabtion 11/14 until 11/20, transitioned to BIPAP and was weaned to nasal cannula on 11/22. Pt's initial Bedside Swallow Evaluation was performed on 01/14/2020 with recommendation of dysphagia 1 with thin liquids. Pt currently NPO. Type of Study: Bedside Swallow Evaluation Previous Swallow Assessment: BSE - 11/9; recommended dysphagia 1 with thin liquids (BSE 11/26, not completed d/t pt mental status) Diet Prior to this Study: NPO Temperature Spikes Noted: No Respiratory Status: Room air History of  Recent Intubation: Yes Length of Intubations (days): 6 days Date extubated: 01/25/20 Behavior/Cognition: Alert;Confused Oral Cavity Assessment: Within  Functional Limits Oral Care Completed by SLP: No Oral Cavity - Dentition: Edentulous Vision: Functional for self-feeding Self-Feeding Abilities: Needs set up Patient Positioning: Upright in bed Baseline Vocal Quality: Low vocal intensity    Oral/Motor/Sensory Function Overall Oral Motor/Sensory Function: Generalized oral weakness Facial ROM: Within Functional Limits Facial Symmetry: Within Functional Limits Lingual ROM: Within Functional Limits Lingual Symmetry: Within Functional Limits Lingual Strength: Within Functional Limits Velum: Within Functional Limits   Ice Chips Ice chips: Within functional limits Presentation: Spoon Other Comments: 8x   Thin Liquid Thin Liquid: Within functional limits Presentation: Cup;Self Fed Other Comments: 8x    Nectar Thick Nectar Thick Liquid: Not tested   Honey Thick Honey Thick Liquid: Not tested   Puree Puree: Within functional limits Presentation: Spoon Other Comments: 8x   Solid     Solid: Not tested      Oliver Pila  Graduate Clinician 02/03/2020,3:09 PM  The information in this patient note, response to treatment, and overall treatment plan developed has been reviewed and agreed upon after reviewing documentation. This session was performed under the supervision of this licensed clinician.   Jerilynn Som, MS, CCC-SLP Speech Language Pathologist Rehab Services [phone]

## 2020-02-03 NOTE — Progress Notes (Signed)
Nutrition Follow-up  DOCUMENTATION CODES:   Non-severe (moderate) malnutrition in context of chronic illness  INTERVENTION:  Initiate Osmolite 1.5 Cal at 15 mL/hr and advance by 20 mL/hr every 8 hours to goal rate of 55 mL/hr (1320 mL goal daily volume). Also provide PROSource Plus 45 mL TID per tube.  Goal regimen provides 2100 kcal, 116 grams of protein, 1003 mL H2O daily.  Provide minimum free water flush of 30 mL Q4hrs to maintain tube patency.  Monitor magnesium, potassium, and phosphorus daily for at least 3 days, MD to replete as needed, as pt is at risk for refeeding syndrome.  NUTRITION DIAGNOSIS:   Moderate Malnutrition related to chronic illness (CHF) as evidenced by moderate fat depletion, moderate muscle depletion.  Ongoing.  GOAL:   Patient will meet greater than or equal to 90% of their needs  Progressing with initiation of tube feeds.  MONITOR:   Vent status, Labs, Weight trends, TF tolerance, I & O's  REASON FOR ASSESSMENT:   Ventilator, Consult Assessment of nutrition requirement/status, Poor PO, Enteral/tube feeding initiation and management  ASSESSMENT:   69 year old male with PMHx of HTN, DM, gout, hx AVR with bioprosthetic valve, GERD, CAD, CHF (EF 20%), recent COVID-19 11/30/2019 admitted with respiratory failure, severe metabolic acidosis, CHF exacerbation.  11/1 intubated 11/6 extubated 11/9 diet advanced to dysphagia 1 with thin liquids following SLP evaluation 11/14 reintubated 11/20 extubated 11/23 diet advanced to dysphagia 2 with thin liquids  Patient with poor PO intake over the past several days. Intake 0-5% since 11/26 per chart. Dobbhoff tube was placed yesterday for medication administration and initiation of tube feeds. Met with patient at bedside today but he was lethargic and unable to provide any history.  Medications reviewed and include: allopurinol, Colace 100 mg BID, Lasix 20 mg daily IV, Novolog 0-9 units Q4hrs, Miralax 17  grams daily, senna-docsuate 1 tablet BID, spironolactone, thiamine 100 mg daily IV, famotidine, heparin infusion, magnesium sulfate 2 grams IV once today.  Labs reviewed: CBG 113-140, CO2 21.  I/O: 1250 mL UOP Yesterday (0.7 mL/kg/hr)  Weight trend: 72 kg on 11/25; -6.7 kg from 11/1  Enteral Access: small-bore feeding tube (Dobbhoff) placed 11/28; terminates in gastric fundus per abdominal x-ray 11/28  Discussed with RN.  Diet Order:   Diet Order            Diet NPO time specified  Diet effective now                EDUCATION NEEDS:   No education needs have been identified at this time  Skin:  Skin Assessment: Skin Integrity Issues: Skin Integrity Issues:: Stage II Stage II: sacrum (2.5cm x 1cm)  Last BM:  Unknown  Height:   Ht Readings from Last 1 Encounters:  01/24/20 5' 7.01" (1.702 m)   Weight:   Wt Readings from Last 1 Encounters:  01/30/20 72 kg   Ideal Body Weight:  67.3 kg  BMI:  Body mass index is 24.85 kg/m.  Estimated Nutritional Needs:   Kcal:  2000-2200  Protein:  110-120 grams  Fluid:  1.5-1.8 L/day or per MD  Jacklynn Barnacle, MS, RD, LDN Pager number available on Amion

## 2020-02-03 NOTE — Progress Notes (Signed)
PHARMACY CONSULT NOTE  Pharmacy Consult for Electrolyte Monitoring and Replacement   Recent Labs: Potassium (mmol/L)  Date Value  02/03/2020 4.2   Magnesium (mg/dL)  Date Value  16/12/9602 1.9   Calcium (mg/dL)  Date Value  54/11/8117 8.8 (L)   Albumin (g/dL)  Date Value  14/78/2956 2.6 (L)   Phosphorus (mg/dL)  Date Value  21/30/8657 3.4   Sodium (mmol/L)  Date Value  02/03/2020 140   Corrected Ca: 9.9 mg/dL  Assessment: 69 year old male transferred to ICU for acute toxic metabolic encephalopathy. Overnight 11/10, patient with two runs pulseless VT, with second episode requiring CPR and defibrillation. Converted back into baseline bundle branch rhythm with rate in 50's-60's.  Similar episodes 11/11 requiring defibrillation. Pharmacy to manage electrolytes.  Patient intubated 11/14 and extubated 11/20   Tube Feeds:Osmolite 1.5 Cal started at 15 mL/hr and advancing by 20 mL/hr every 8 hours to goal rate of 55 mL/hr   Free Water: 30 mL per tube every 4 hours  Diuretics: IV Lasix 20 mg daily + spironolactone 25 mg daily  Goal of Therapy:  Potassium 4.0 - 5.1 mmol/L Magnesium 2.0 - 2.4 mg/dL All other electrolytes within normal limits  Plan:   2 grams IV magnesium sulfate x 1   Follow-up electrolytes with AM labs  Lowella Bandy  02/03/2020 9:10 AM

## 2020-02-03 NOTE — Progress Notes (Signed)
Progress Note  Patient Name: Travis Maxwell Date of Encounter: 02/03/2020  Primary Cardiologist: Lorine Bears, MD   Subjective   Relatively sedated this AM. Not able to converse with me.   Shakes his head no to if he slept well last night.  Inpatient Medications    Scheduled Meds:  allopurinol  300 mg Oral Daily   aspirin  81 mg Oral Daily   atorvastatin  40 mg Oral QHS   carvedilol  6.25 mg Oral BID WC   chlorhexidine  15 mL Mouth Rinse BID   Chlorhexidine Gluconate Cloth  6 each Topical Daily   docusate  100 mg Oral BID   feeding supplement  237 mL Oral TID BM   fentaNYL (SUBLIMAZE) injection  25 mcg Intravenous Once   furosemide  20 mg Intravenous Daily   insulin aspart  0-9 Units Subcutaneous Q4H   mouth rinse  15 mL Mouth Rinse q12n4p   polyethylene glycol  17 g Oral Daily   sacubitril-valsartan  1 tablet Oral BID   senna-docusate  1 tablet Oral BID   sodium chloride flush  10-40 mL Intracatheter Q12H   sodium chloride flush  3 mL Intravenous Q12H   spironolactone  25 mg Oral Daily   thiamine injection  100 mg Intravenous Daily   Continuous Infusions:  sodium chloride Stopped (02/02/20 1732)   amiodarone 30 mg/hr (02/02/20 1800)   famotidine (PEPCID) IV Stopped (02/02/20 1017)   heparin 1,350 Units/hr (02/03/20 0746)   PRN Meds: sodium chloride, acetaminophen, albuterol, LORazepam, metoprolol tartrate, OLANZapine, ondansetron (ZOFRAN) IV, oxyCODONE, polyethylene glycol   Vital Signs    Vitals:   02/03/20 0600 02/03/20 0700 02/03/20 0746 02/03/20 0800  BP: 109/67 116/65  119/74  Pulse: 71 77  78  Resp: 17 (!) 28  (!) 22  Temp:   97.8 F (36.6 C)   TempSrc:   Axillary   SpO2: 97% 96%  95%  Weight:      Height:        Intake/Output Summary (Last 24 hours) at 02/03/2020 0852 Last data filed at 02/03/2020 0800 Gross per 24 hour  Intake 1199.55 ml  Output 1550 ml  Net -350.45 ml   Last 3 Weights 01/30/2020 01/29/2020  01/28/2020  Weight (lbs) 158 lb 11.7 oz 158 lb 8.2 oz 158 lb 8.2 oz  Weight (kg) 72 kg 71.9 kg 71.9 kg      Telemetry    NSR, PVCs, Trigeminy, 70-80s - Personally Reviewed  ECG    No new tracings- Personally Reviewed  Physical Exam   GEN: No acute distress.  Ill appearing Neck: No JVD Cardiac: RRR with intermittent extrasystole, 2/6 systolic murmur, rubs, or gallops.  Respiratory:  Coarse breath sounds bilaterally. Snoring on exam GI: Soft, nontender, non-distended  MS: No edema; No deformity. Neuro:  Nonfocal  Psych: Normal affect   Labs    High Sensitivity Troponin:   Recent Labs  Lab 01/09/20 0152 01/16/20 0245 01/16/20 0702 01/16/20 0838 01/19/20 0433  TROPONINIHS 89* 934* 711* 674* 234*      Chemistry Recent Labs  Lab 01/31/20 0409 01/31/20 1744 02/01/20 0250 02/01/20 0250 02/01/20 1404 02/02/20 0526 02/03/20 0551  NA 138  --  140  --   --  139 140  K 3.5   < > 3.8   < > 4.1 3.6 4.2  CL 104  --  109  --   --  107 109  CO2 22  --  20*  --   --  22 21*  GLUCOSE 265*  --  109*  --   --  121* 124*  BUN 11  --  12  --   --  13 13  CREATININE 0.78  --  0.88  --   --  0.80 0.89  CALCIUM 8.4*  --  9.2  --   --  9.0 8.8*  PROT 6.9  --   --   --   --   --   --   ALBUMIN 2.6*  --   --   --   --   --   --   AST 18  --   --   --   --   --   --   ALT 12  --   --   --   --   --   --   ALKPHOS 66  --   --   --   --   --   --   BILITOT 0.8  --   --   --   --   --   --   GFRNONAA >60  --  >60  --   --  >60 >60  ANIONGAP 12  --  11  --   --  10 10   < > = values in this interval not displayed.     Hematology Recent Labs  Lab 02/01/20 0250 02/02/20 0526 02/03/20 0551  WBC 18.3* 18.4* 15.6*  RBC 7.51* 7.33* 7.29*  HGB 16.0 15.8 15.8  HCT 51.7 50.6 51.1  MCV 68.8* 69.0* 70.1*  MCH 21.3* 21.6* 21.7*  MCHC 30.9 31.2 30.9  RDW 28.0* 28.9* 28.6*  PLT 893* 868* 729*    BNP No results for input(s): BNP, PROBNP in the last 168 hours.   DDimer No results  for input(s): DDIMER in the last 168 hours.   Radiology    DG Abd 1 View  Result Date: 02/02/2020 CLINICAL DATA:  Dobbhoff placement EXAM: ABDOMEN - 1 VIEW COMPARISON:  01/20/2020 FINDINGS: Portable radiograph of the lower chest and upper abdomen was obtained for the purposes of enteric tube localization. A Dobbhoff tube is seen coursing below the diaphragm with distal tip terminating within the gastric fundus. Visualized bowel gas pattern is nonobstructive. Cardiomegaly with prosthetic cardiac valve. IMPRESSION: Dobbhoff tube terminates within the gastric fundus. Electronically Signed   By: Duanne Guess D.O.   On: 02/02/2020 11:02    Cardiac Studies   LHC 01/24/20:  Previously placed Prox RCA to Mid RCA stent (unknown type) is widely patent.  Prox RCA lesion is 50% stenosed.  1. Patent stent in a nondominant right coronary artery. No significant coronary artery disease overall. 2. The replaced aortic valve was not crossed. 3. Right heart catheterization showed mildly elevated filling pressures, minimal pulmonary hypertension and normal cardiac output.  Recommendations: The patient has no ischemic substrate for ventricular tachycardia. He has nonischemic cardiomyopathy. His hemodynamics appear good overall. Recommend continuing same dose of IV furosemide 20 mg daily which can be switched to an oral diuretic after extubation. I am going to stop lidocaine drip and continue amiodarone drip which can subsequently be switched to oral after extubation. I elected to add small dose carvedilol to assist with his cardiomyopathy and ventricular tachycardia.  Echo 01/21/20: 1. Left ventricular ejection fraction, by estimation, is 20 to 25%. The  left ventricle has severely decreased function. The left ventricle  demonstrates regional wall motion abnormalities (see scoring  diagram/findings for description). The left  ventricular  internal cavity size was moderately dilated. There is  mild  left ventricular hypertrophy. Left ventricular diastolic parameters are  consistent with Grade I diastolic dysfunction (impaired relaxation).  Elevated left atrial pressure. There is  severe global hypokinesis with relative sparing of the basal segments.  2. Right ventricular systolic function is moderately reduced. The right  ventricular size is mildly enlarged. Tricuspid regurgitation signal is  inadequate for assessing PA pressure.  3. Left atrial size was mildly dilated.  4. The mitral valve is grossly normal. Mild mitral valve regurgitation.  No evidence of mitral stenosis.  5. The aortic valve was not well visualized. Aortic valve regurgitation  is not visualized. Echo findings are consistent with normal structure and  function of the aortic valve prosthesis.  6. Pulmonic valve regurgitation not well assessed.  Patient Profile     69 y.o. 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   Assessment & Plan    1. Sustained VT/tachyarrhythmias: Since admission, patient has had SVT and WCT.  He required early admission DCCV and  synchronized defibrillation x3.  IV amiodarone discontinued due to concern for QT prolongation and transition to lidocaine infusion.  EP subsequently evaluated the patient and recommended resumption of amiodarone.  Lidocaine was subsequently discontinued.  He underwent LHC with nonobstructive CAD.   Continue IV amiodarone per EP.    He will eventually need transition to oral amiodarone before discharge /  oral amiodarone 400 mg  twice daily until achieves 10 g load completion.  Maintain potassium goal 4.0, magnesium goal 2.0.    Once discharged, tentative plan for formal EP evaluation for AICD.    Consider LifeVest at discharge.    Schedule for follow-up with EP in our office at discharge for evaluation/formal consideration of ICD placement.   2. Acute on chronic systolic heart failure:  LVEF <14%. LHC without obstructive CAD.   Continue IV lasix 20mg  daily.   Euvolemic on exam this AM.  I/Os. Net -350.4cc with output -1.550 yesterday.  Daily wt. No recent wt since 11/25.  Daily BMET. Stable renal function. Cr 0.89 with BUN 13.  Maintain slightly negative fluid balance and given current renal function stable.  Recommend schedule with EP at discharge to evaluate as OP for AICD placement as above for secondary prevention. Consider LifeVest.  Continue GDMT as tolerated.  Continue Entresto 24/26mg  BID, Spironolactone 25mg , Coreg 6.25mg  BID, Lopressor PRN as tolerated.   3. Coronary artery disease s/p non-STEMI s/p PCI/DES to mid RCA (2019)  High-sensitivity troponin minimally elevated and flat trending, thought 2/2 supply demand ischemia in the setting of his comorbid conditions including sustained VT, respiratory failure s/p COVID-19 infection and pna, and acute on chronic systolic heart failure as above.  LHC without obstructive CAD and patent stents.  Escalation of GDMT recommended as tolerated.  Continue ASA, carvedilol, Lipitor.  4. Paroxysmal atrial fibrillation   Maintaining NSR.    Continue BB as tolerated by BP.  Continue anticoagulation with IV heparin as H&H tolerates.   Transition back to Eliquis prior to discharge.  Continue amiodarone.   Transition to oral before discharge.  5. Aortic  stenosis s/p AVR  Previous echo as above. History of aortic insufficiency as above on most recent echo.  Continue conservative management.  6. Leukocytosis  Per critical  care.  7. Pneumonia, COVID-19 infection, positive 11/2019  History of COVID-19 infection.  8. Tobacco and marijuana use  Recommend complete cessation.  Nicotine patch as needed.  For questions or updates, please contact [facility] Please consult [email or web address] for contact info under        Signed, Lennon Alstrom, PA-C  02/03/2020, 8:52 AM

## 2020-02-04 DIAGNOSIS — Z4659 Encounter for fitting and adjustment of other gastrointestinal appliance and device: Secondary | ICD-10-CM

## 2020-02-04 DIAGNOSIS — I5023 Acute on chronic systolic (congestive) heart failure: Secondary | ICD-10-CM | POA: Diagnosis not present

## 2020-02-04 DIAGNOSIS — G9341 Metabolic encephalopathy: Secondary | ICD-10-CM

## 2020-02-04 DIAGNOSIS — I472 Ventricular tachycardia: Secondary | ICD-10-CM | POA: Diagnosis not present

## 2020-02-04 LAB — BASIC METABOLIC PANEL
Anion gap: 10 (ref 5–15)
BUN: 11 mg/dL (ref 8–23)
CO2: 22 mmol/L (ref 22–32)
Calcium: 9 mg/dL (ref 8.9–10.3)
Chloride: 106 mmol/L (ref 98–111)
Creatinine, Ser: 0.87 mg/dL (ref 0.61–1.24)
GFR, Estimated: >60 mL/min (ref >60)
Glucose, Bld: 144 mg/dL — ABNORMAL HIGH (ref 70–99)
Potassium: 3.9 mmol/L (ref 3.5–5.1)
Sodium: 138 mmol/L (ref 135–145)

## 2020-02-04 LAB — GLUCOSE, CAPILLARY
Glucose-Capillary: 166 mg/dL — ABNORMAL HIGH (ref 70–99)
Glucose-Capillary: 149 mg/dL — ABNORMAL HIGH (ref 70–99)
Glucose-Capillary: 134 mg/dL — ABNORMAL HIGH (ref 70–99)
Glucose-Capillary: 153 mg/dL — ABNORMAL HIGH (ref 70–99)
Glucose-Capillary: 126 mg/dL — ABNORMAL HIGH (ref 70–99)
Glucose-Capillary: 172 mg/dL — ABNORMAL HIGH (ref 70–99)
Glucose-Capillary: 93 mg/dL (ref 70–99)

## 2020-02-04 LAB — CBC WITH DIFFERENTIAL/PLATELET
Abs Immature Granulocytes: 0.14 10*3/uL — ABNORMAL HIGH (ref 0.00–0.07)
Basophils Absolute: 0.3 10*3/uL — ABNORMAL HIGH (ref 0.0–0.1)
Basophils Relative: 2 %
Eosinophils Absolute: 1.3 10*3/uL — ABNORMAL HIGH (ref 0.0–0.5)
Eosinophils Relative: 9 %
HCT: 50.1 % (ref 39.0–52.0)
Hemoglobin: 15.3 g/dL (ref 13.0–17.0)
Immature Granulocytes: 1 %
Lymphocytes Relative: 24 %
Lymphs Abs: 3.4 10*3/uL (ref 0.7–4.0)
MCH: 21.4 pg — ABNORMAL LOW (ref 26.0–34.0)
MCHC: 30.5 g/dL (ref 30.0–36.0)
MCV: 70.1 fL — ABNORMAL LOW (ref 80.0–100.0)
Monocytes Absolute: 1.2 10*3/uL — ABNORMAL HIGH (ref 0.1–1.0)
Monocytes Relative: 8 %
Neutro Abs: 8 10*3/uL — ABNORMAL HIGH (ref 1.7–7.7)
Neutrophils Relative %: 56 %
Platelets: 807 10*3/uL — ABNORMAL HIGH (ref 150–400)
RBC: 7.15 MIL/uL — ABNORMAL HIGH (ref 4.22–5.81)
RDW: 28.3 % — ABNORMAL HIGH (ref 11.5–15.5)
Smear Review: NORMAL
WBC: 14.3 10*3/uL — ABNORMAL HIGH (ref 4.0–10.5)
nRBC: 0 % (ref 0.0–0.2)

## 2020-02-04 LAB — PHOSPHORUS: Phosphorus: 2.9 mg/dL (ref 2.5–4.6)

## 2020-02-04 LAB — MAGNESIUM
Magnesium: 1.8 mg/dL (ref 1.7–2.4)
Magnesium: 1.8 mg/dL (ref 1.7–2.4)

## 2020-02-04 LAB — HEPARIN LEVEL (UNFRACTIONATED): Heparin Unfractionated: 0.69 IU/mL (ref 0.30–0.70)

## 2020-02-04 MED ORDER — MAGNESIUM SULFATE 2 GM/50ML IV SOLN
2.0000 g | Freq: Once | INTRAVENOUS | Status: AC
  Administered 2020-02-04: 2 g via INTRAVENOUS
  Filled 2020-02-04: qty 50

## 2020-02-04 MED ORDER — POTASSIUM CHLORIDE 20 MEQ PO PACK
20.0000 meq | PACK | Freq: Every day | ORAL | Status: DC
  Administered 2020-02-04: 20 meq via ORAL
  Filled 2020-02-04: qty 1

## 2020-02-04 MED ORDER — MELATONIN 5 MG PO TABS
5.0000 mg | ORAL_TABLET | Freq: Every day | ORAL | Status: DC
  Administered 2020-02-04 – 2020-02-19 (×16): 5 mg via ORAL
  Filled 2020-02-04 (×16): qty 1

## 2020-02-04 MED ORDER — LORAZEPAM 2 MG/ML IJ SOLN
INTRAMUSCULAR | Status: AC
  Filled 2020-02-04: qty 1

## 2020-02-04 MED ORDER — LORAZEPAM 2 MG/ML IJ SOLN
2.0000 mg | Freq: Once | INTRAMUSCULAR | Status: AC
  Administered 2020-02-04: 2 mg via INTRAVENOUS

## 2020-02-04 MED ORDER — APIXABAN 5 MG PO TABS
5.0000 mg | ORAL_TABLET | Freq: Two times a day (BID) | ORAL | Status: DC
  Administered 2020-02-04 – 2020-02-20 (×32): 5 mg via ORAL
  Filled 2020-02-04 (×33): qty 1

## 2020-02-04 MED ORDER — SPIRONOLACTONE 25 MG PO TABS
12.5000 mg | ORAL_TABLET | Freq: Every day | ORAL | Status: DC
  Administered 2020-02-04 – 2020-02-20 (×15): 12.5 mg via ORAL
  Filled 2020-02-04 (×3): qty 1
  Filled 2020-02-04 (×3): qty 0.5
  Filled 2020-02-04: qty 1
  Filled 2020-02-04: qty 0.5
  Filled 2020-02-04: qty 1
  Filled 2020-02-04: qty 0.5
  Filled 2020-02-04: qty 1
  Filled 2020-02-04 (×3): qty 0.5
  Filled 2020-02-04 (×3): qty 1
  Filled 2020-02-04: qty 0.5
  Filled 2020-02-04: qty 1
  Filled 2020-02-04 (×7): qty 0.5
  Filled 2020-02-04: qty 1
  Filled 2020-02-04: qty 0.5
  Filled 2020-02-04 (×2): qty 1

## 2020-02-04 MED ORDER — LORAZEPAM 2 MG/ML IJ SOLN
0.5000 mg | Freq: Once | INTRAMUSCULAR | Status: AC
  Administered 2020-02-04: 0.5 mg via INTRAVENOUS
  Filled 2020-02-04: qty 1

## 2020-02-04 NOTE — Progress Notes (Addendum)
PHARMACY CONSULT NOTE  Pharmacy Consult for Electrolyte Monitoring and Replacement   Recent Labs: Potassium (mmol/L)  Date Value  02/04/2020 3.9   Magnesium (mg/dL)  Date Value  47/09/6149 1.8   Calcium (mg/dL)  Date Value  83/43/7357 9.0   Albumin (g/dL)  Date Value  89/78/4784 2.6 (L)   Phosphorus (mg/dL)  Date Value  12/82/0813 2.9   Sodium (mmol/L)  Date Value  02/04/2020 138   Assessment: 69 year old male transferred to ICU for acute toxic metabolic encephalopathy. Overnight 11/10, patient with two runs pulseless VT, with second episode requiring CPR and defibrillation. Converted back into baseline bundle branch rhythm with rate in 50's-60's.  Similar episodes 11/11 requiring defibrillation. Pharmacy to manage electrolytes.  Patient intubated 11/14 and extubated 11/20   Diuretics: IV Lasix 20 mg daily + Aldactone 12.5 mg daily  Goal of Therapy:  Potassium 4.0 - 5.1 mmol/L Magnesium 2.0 - 2.4 mg/dL All other electrolytes within normal limits  Plan:   Provider has ordered IV magnesium sulfate 2 g x 1  Will order PO KCl 20 mEq daily to be given when Lasix given and held when Lasix held.   Follow-up electrolytes with AM labs  Travis Maxwell  02/04/2020 8:54 AM

## 2020-02-04 NOTE — Progress Notes (Signed)
OT Cancellation Note  Patient Details Name: Travis Maxwell MRN: [id number] DOB: 02-04-1951   Cancelled Treatment:    Reason Eval/Treat Not Completed: Other (comment)  RN and sitter present in room when OT presents. State that pt has been agitated this afternoon. Defer therapy at this time. Will f/u at later date/time as able.   Rejeana Brock, MS, OTR/L ascom [phone] 02/04/20, 4:42 PM

## 2020-02-04 NOTE — Progress Notes (Signed)
CRITICAL CARE NOTE  [name] presenting to the ED on 01/06/2020 in respiratory distress, complaining of chest pain and shortness of breath. He also reported orthopnea and increased lower extremity swelling. History was limited due to acuity of condition and respiratory status. Patient was placed on biPAP, but progressively worsened and was emergently intubated. Patient is a current 0.5ppd smoker and uses marijuana. He has a severe metabolic acidosis. Patient has a past medical history significant for HTN, T2DM, HLD, GERD, CAD (s/p PCI), AVR with bioprosthetic valve, systolic CHF with EF of 70%, and paroxysmal A. Fib, currently anticoagulated on Eliquis. EKG upon arrival to ED revealed sinus rhythm at a rate of 99bpm with RBBB and PVCs with ST elevation in II, III, avF, V4-V6. Cardiologist on call stated that STEMI criteria were not met and advised to manage CHF. Patient received IV diltiazem and reported improvement of symptoms. Labs were notable for high sensitivity troponin 71->91->89. BNP 1046, creatinine 1.26, BUN 21, potassium 5.4. CXR showed improved pulmonary edema since prior study with chronic pulmonary venous hypertension. Notably, patient was admitted to [facility] ED 12/04/2019 for acute respiratory failure and acute on chronic systolic CHF in the setting of COVID pneumonia.   SIGNIFICANT EVENTS  11/02 - severe cardiogenic shock, remains on vent 11/03 - severe respiratory failure, severe systolic CHF 26/37 - patient remains critically ill 11/05 - patient remains critically ill 11/06 - off pressors, extubated to biPAP 11/07 - remains extubated, febrile, MSSA+ tracheal culture - started ancef 11/08 - remains extubated, on 2L by nasal cannula. Afebrile today with white count of 21.3K, up from 15.7K yesterday. Still encephalopathic and drowsy. On and off Precedex. Went into narrow complex SVT,then progressed to wide complex tachycardiawas shocked and given a loading dose of amiodarone. Will  remain on amiodarone gtt.Cardiology at bedside.  11/09 - remains extubated, on 2L by nasal cannula. Afebrile today with white count of 19.2K, down since yesterday. BP soft overnight, was started on Neo-synephrine infusion. More alert than yesterday, but only oriented to self. Failed Yale 3oz swallow study early this morning, started coughing at the end of the study. Will need to remain NPO until cleared by SLP. Patient has been educated on this, yet continues to incessantly ask for water.  11/10 - patient clinically improved. He is alert communicative and in no distress. He ate entire meal without issues. Still has confusion intermittently during interview. Frequent PVCs on telemetry.  Will initiate OT/PT today for downgrade planning. 11/11 - patient minimally symptomatic, he again had episodes of Torsades today s/p 120Jx1 mag 2g.  Poor prognosis overall with advanced CHF.  11/12 - Dicussed case with cardiology. Antiarrythmic agents being modified. Will also call consult for Electrophysiology  11/13 - Patient with no arrythmia overnight. He remains on precedex and neosynephrine.  I spoke with cardiology Dr Rayann Heman regarding EP evaluation- he recommended to continue with current medical therapy and have cardiologist call to EP on Monday to review options.  He has 1:1 sitter and is resting in bed comfortably. Spoke with Velva Harman POA daughter to update on poor prognosis worsening clinical condition.  11/14 - further deterioration overnight.  Patient required reintubation and MV.  Updated next of kin daughter Velva Harman this am and sent message to cardiology to discuss poor prognosis with family.    11/15 - remains intubated and sedated, high risk for cardiac arrest and death February 18, 2023 - remains intubated and sedated, frequent PVCs overnight, 2g magnesium given, awaiting EP consult, patient becomes tachypneic and diaphoretic during  SBTs 11/17 - increased ectopy, seen by EP who advised weaning lidocaine and restarting  amiodarone, planning for right & left heart cath on Friday 11/19 with Dr. Fletcher Anon, discontinue Eliquis, start IV heparin interim, tracheal culture 11/14 positive for MRSA and pseudomonas, started cefepime and linezolid 11/18 - remains intubated and sedated, on continuous infusion of amiodarone and lidocaine, metoprolol discontinued by cardiology due to low blood pressures, right/left heart cath scheduled for tomorrow 11/19 - pt s/p L&RHC no obstructive lesions, RHC with normal pressures. Patient will be treated medically with transition to PO amio and plan for SBT with attempt to liberate.  11/20 - Patient was liberated from ventilator.  He had persistent VT post extubation, was fortunate to have cardiologist present on time and had immediate medical intervention with improvement. Still very poor prognosis long term. Spoke to granddaugther at bedside requested family meeting to re-address goals of care due to limited therapy available for patient.  11/21 - Patient was able to stay off ventilator last night. He was started on Precedex overnight and is currently sedated to RASS-1 on nasal canula, plan to wean down/off sedation.  11/22 - Patient sitting up in bed, no obvious distress on BiPAP, no acute events overnight, attempt transition to nasal cannula, on NTG drip for BP control per cardiology until PO meds can be taken 11/23 - Patient required Haldol and Valium overnight for agitation, on 2L by [place] today, passed bedside swallow study this morning, can transition to PO meds, Foley removed 11/24 - Patient remains extubated and off pressors, breathing comfortably on room air, antibiotics completed yesterday, PT/OT today, fluctuating alertness, transitioned from IV amiodarone and heparin to PO amiodarone and Eliquis  11/29- patient is encephalopathic.  Working on swallow eval and PO meds to transition out of MICU 02/04/20- patient confused with 1:1 sitter.    No episodes of arrythmia.    CC  Follow up  respiratory failure  SUBJECTIVE Chronic ill appearing Prognosis is guarded Confused, speech following Cardiology following   BP (!) 86/57 (BP Location: Left Arm)   Pulse 68   Temp 98.6 F (37 C) (Axillary)   Resp (!) 28   Ht 5' 7.01" (1.702 m)   Wt 72 kg   SpO2 100%   BMI 24.85 kg/m    I/O last 3 completed shifts: In: 1577.1 [P.O.:480; I.V.:910.2; NG/GT:36.8; IV Piggyback:150.1] Out: 1650 [Urine:1350; Emesis/NG output:300] Total I/O In: 371.3 [I.V.:371.3] Out: 2250 [Urine:2250]  SpO2: 100 % O2 Flow Rate (L/min): 2 L/min FiO2 (%): 28 %  Estimated body mass index is 24.85 kg/m as calculated from the following:   Height as of this encounter: 5' 7.01" (1.702 m).   Weight as of this encounter: 72 kg.   REVIEW OF SYSTEMS  ROS limited due toseverecritical illness. Respiratory:positive for intermittent cough,negative for shortness of breath Cardiovascular:negative for palpitations, chest pain, lower extremity swelling Gastrointestinal:negative for abdominal pain, nausea/vomiting Neurological:negative for lightheadedness, syncope, headaches  Pressure Injury 01/20/20 Sacrum Posterior;Medial Stage 2 -  Partial thickness loss of dermis presenting as a shallow open injury with a red, pink wound bed without slough. (Active)  01/20/20 0800  Location: Sacrum  Location Orientation: Posterior;Medial  Staging: Stage 2 -  Partial thickness loss of dermis presenting as a shallow open injury with a red, pink wound bed without slough.  Wound Description (Comments):   Present on Admission: No    PHYSICAL EXAMINATION:  GENERAL: Chronically ill appearing, NAD, conversational, difficult to understand HEAD: Normocephalic, atraumatic.  EYES: Pupils equal, round, reactive  to light.  No scleral icterus.  MOUTH: Moist mucosal membrane. NECK: Supple. No JVD. PULMONARY: Mildly tachypneic, unlabored, coarse breath sounds bilaterally CARDIOVASCULAR: S1 and S2. Regular rate and  rhythm. No murmurs, rubs, or gallops.  GASTROINTESTINAL: Soft, nontender, non-distended.  Positive bowel sounds.   MUSCULOSKELETAL: No swelling, clubbing, or edema.  NEUROLOGIC: A&O x 1, follows simple commands, confused GCS<10 SKIN: Intact, warm, dry  MEDICATIONS: I have reviewed all medications and confirmed regimen as documented Scheduled Meds: . allopurinol  300 mg Oral Daily  . apixaban  5 mg Oral BID  . aspirin  81 mg Oral Daily  . atorvastatin  40 mg Oral QHS  . carvedilol  6.25 mg Oral BID WC  . chlorhexidine  15 mL Mouth Rinse BID  . Chlorhexidine Gluconate Cloth  6 each Topical Daily  . docusate  100 mg Oral BID  . feeding supplement (PROSource TF)  45 mL Per Tube TID  . free water  30 mL Per Tube Q4H  . furosemide  20 mg Intravenous Daily  . insulin aspart  0-9 Units Subcutaneous Q4H  . mouth rinse  15 mL Mouth Rinse q12n4p  . melatonin  5 mg Oral QHS  . polyethylene glycol  17 g Oral Daily  . potassium chloride  20 mEq Oral Daily  . sacubitril-valsartan  1 tablet Oral BID  . senna-docusate  1 tablet Oral BID  . sodium chloride flush  10-40 mL Intracatheter Q12H  . sodium chloride flush  3 mL Intravenous Q12H  . spironolactone  12.5 mg Oral Daily  . thiamine injection  100 mg Intravenous Daily   Continuous Infusions: . sodium chloride 10 mL/hr at 02/04/20 0800  . amiodarone 30 mg/hr (02/04/20 1055)  . famotidine (PEPCID) IV 20 mg (02/04/20 0832)  . feeding supplement (OSMOLITE 1.5 CAL) 1,000 mL (02/03/20 1533)   PRN Meds:.sodium chloride, acetaminophen, albuterol, metoprolol tartrate, ondansetron (ZOFRAN) IV, polyethylene glycol   CULTURE RESULTS   Recent Results (from the past 240 hour(s))  Expectorated sputum assessment w rflx to resp cult     Status: None   Collection Time: 01/26/20 11:39 PM   Specimen: Expectorated Sputum  Result Value Ref Range Status   Specimen Description EXPECTORATED SPUTUM  Final   Special Requests NONE  Final   Sputum evaluation    Final    THIS SPECIMEN IS ACCEPTABLE FOR SPUTUM CULTURE Performed at [facility], [address], [place], [place] [address]    Report Status 01/27/2020 FINAL  Final  Culture, respiratory     Status: None   Collection Time: 01/26/20 11:39 PM  Result Value Ref Range Status   Specimen Description   Final    EXPECTORATED SPUTUM Performed at [facility], [address] [place], [place] [address]    Special Requests   Final    NONE Reflexed from [id number] Performed at [facility], [address] [place], [place] [address]    Gram Stain   Final    MODERATE SQUAMOUS EPITHELIAL CELLS PRESENT FEW WBC PRESENT, PREDOMINANTLY PMN RARE GRAM POSITIVE COCCI RARE GRAM NEGATIVE RODS    Culture   Final    NO GROWTH 2 DAYS Performed at [facility], [place] [address] [place], [place] [address]    Report Status 01/29/2020 FINAL  Final    LABS  CBC Latest Ref Rng & Units 02/04/2020 02/03/2020 02/02/2020  WBC 4.0 - 10.5 K/uL 14.3(H) 15.6(H) 18.4(H)  Hemoglobin 13.0 - 17.0 g/dL 15.3 15.8 15.8  Hematocrit 39 - 52 % 50.1 51.1 50.6  Platelets 150 - 400 K/uL 807(H) 729(H) 868(H)   BMP Latest Ref Rng & Units 02/04/2020 02/03/2020 02/02/2020  Glucose 70 - 99 mg/dL 144(H) 124(H) 121(H)  BUN 8 - 23 mg/dL _0 Creatinine 0.61 - 1.24 mg/dL 0.87 0.89 0.80  Sodium 135 - 145 mmol/L 138 140 139  Potassium 3.5 - 5.1 mmol/L 3.9 4.2 3.6  Chloride 98 - 111 mmol/L 106 109 107  CO2 22 - 32 mmol/L 22 21(L) 22  Calcium 8.9 - 10.3 mg/dL 9.0 8.8(L) 9.0    Intake/Output Summary (Last 24 hours) at 02/04/2020 1603 Last data filed at 02/04/2020 1400 Gross per 24 hour  Intake 623.45 ml  Output 3000 ml  Net -2376.55 ml    IMAGING    No results found.  SIGNIFICANT DIAGNOSTIC TESTS  01/21/20 - Echocardiogram: LVEF 20 to 25% with severely decreased function and severe global hypokinesis. Left ventricle moderately dilated with mild left ventricular hypertrophy.Grade  1 diastolic dysfunction and elevated left atrial pressure.Left atrium mildly dilated mild mitral valve regurgitation. 01/24/20 - Right/Left Heart Cath: right heart catheterization showed mildly elevated filling pressures, minimal pulmonary hypertension and normal cardiac output previously placed proximal RCA to mid RCA stent is widely patent.No significant coronary artery disease overall.   Nutrition Status: Nutrition Problem: Moderate Malnutrition Etiology: chronic illness (CHF) Signs/Symptoms: moderate fat depletion, moderate muscle depletion Interventions: Tube feeding, MVI       Central Line/ continued, requirement due to  Reason to continue [employer] Monitoring of central venous pressure or other hemodynamic parameters and poor IV access     ASSESSMENT AND PLAN  [name] with severe respiratory failure due to metabolic acidosis with severe systolic CHF exacerbation, previous history of COVID-19 in the setting of drug abuse, undergoing therapy for HCAP, complicated by tachyarrhythmia and progressive heart failure. Patient with recurrent respiratory failure requiring intubation. Remains extubated since 11/20.  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 Fletcher Anon, discontinue Eliquis, initiate IV heparin interim - R&LHC showed non-obstructive disease with patent stents, may need AICD for secondary prevention - Passed swallow study 11/23 -  resume PO cardiac meds  - Cardiology following - appreciate input - Per cardiology will need LifeVest at discharge with f/u EP outpatient for AICD evaluation  SHOCK-CARDIOGENIC due to tachyarrhythmias - S/p electrical cardioversion - Use vasopressors as needed to keep MAP > 65 - IV amiodarone discontinued due to torsades --> reassessed by EP 11/16, no past evidence of torsades - Transitioned from IV lidocaine back to IV amiodarone per EP Dr. 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  NEUROLOGY - Acute metabolic encephalopathy, need for sedation - Continue safety sitter - Previously requiring precedex drip, haldol, diazepam PRN for agitation/anxiety - Start scheduled Zyprexa daily at bedtime  CARDIAC - ICU monitoring - Telemetry showing sinus rhythm with PVCs  ID  - Tracheal culture 11/14 positive for MRSA and pseudomonas - Repeat culture 11/21 showed no growth - Antibiotic course completed 11/23  GI - GI PROPHYLAXIS as indicated - DIET --> Dysphagia 2 (fine chop); thin liquid - Constipation protocol as indicated  ENDO - Will use ICU hypoglycemic\Hyperglycemia protocol if indicated  ELECTROLYTES - Follow labs as needed - Replace as needed - Pharmacy consultation and following   DVT/GI PRX ordered and assessed TRANSFUSIONS AS NEEDED MONITOR FSBS I Assessed the need for Labs I Assessed the need for Foley I Assessed the  need for Central Venous Line Family Discussion when available I Assessed the need for Mobilization I made an Assessment of medications to be adjusted accordingly Safety Risk assessment completed    Critical care provider statement:    Critical care time (minutes):  33   Critical care time was exclusive of:  Separately billable procedures and  treating other patients   Critical care was necessary to treat or prevent imminent or  life-threatening deterioration of the following conditions:  Encephalopathy, CHF exacerbation, Ventricular fibrillation   Critical care was time spent personally by me on the following  activities:  Development of treatment plan with patient or surrogate,  discussions with consultants, evaluation of patient's response to  treatment, examination of patient, obtaining history from patient or  surrogate, ordering and performing treatments and interventions, ordering  and review of laboratory studies and re-evaluation of patient's condition   I assumed direction of critical care for this patient from another  provider in my specialty: no     Ottie Glazier, M.D.  Pulmonary & [place]

## 2020-02-04 NOTE — Progress Notes (Signed)
PROGRESS NOTE  Travis Maxwell ZOX:[id number] DOB: 12-Oct-1950 DOA: 01/06/2020 PCP: [facility]   LOS: 29 days   Brief narrative:  70 years old African-American male with past medical history of hypertension, diabetes mellitus type 2, hyperlipidemia, GERD, coronary artery disease status post PCI, history of aortic valve replacement on Eliquis presented to the hospital with respiratory distress, chest pain and shortness of breath on 01/06/2020.  He also had orthopnea and leg swelling.  Initially, patient was put on BiPAP but his breathing status worsened so he was emergently intubated and admitted to the hospital.  Patient was noted to be in severe cardiogenic shock on 01/07/2020.  Vasopressors were weaned off on 11/ 6/21 and patient was extubated to BiPAP at that time but patient was subsequently intubated on 01/19/2020.  Patient also had severe metabolic acidosis.  EKG showed some concerns for ST elevation but did not meet ST elevation MI criteria as per cardiology.  Patient received IV Cardizem and have a borderline elevated troponins.  BNP on admission was 1046.  Patient had a chest x-ray which showed vascular congestion.  Of note, patient has history of Covid pneumonia on 12/04/2019.  Patient remained extubated and was off pressors on 01/29/20 and was subsequently considered for transfer to medical service.  Patient has remained encephalopathic with a poor oral intake.  Patient was started on NG tube and tube feeding was started on 02/03/2020.  Assessment/Plan:  Active Problems:   Acute on chronic systolic congestive heart failure (HCC)   Respiratory failure (HCC)   Malnutrition of moderate degree   Adult failure to thrive   Palliative care by specialist   DNR (do not resuscitate) discussion   Delirium   Pressure injury of skin   Ventricular tachycardia (HCC)  Poor oral intake with metabolic encephalopathy Status post NG tube placement. on tube feeding.  Dietary on board.   Concern for hypoxic encephalopathy.  Leukocytosis, thrombocytosis. Had MRSA and Pseudomonas pneumonia completed antibiotic 11/23.  No fever.  Will closely monitor.  WBC trending down to  14.3 from 15.6 <18.4.  Acute hypoxic and hypercarbic  respiratory failure. Status post intubation and subsequent extubation.   Patient had MRSA and Pseudomonas pneumonia,completed Zyvox and cefepime on 01/28/20.  Currently on room air.  Acute systolic congestive heart failure.  Nonischemic cardiomyopathy.  Last known ejection fraction of 20 to 25%.  Cardiology on board. Ptient underwent cardiac catheterization on 01/24/20 which showed nonobstructive disease with patent stents.  Patient  need AICD for secondary prevention, and LifeVest prior to discharge.  Currently on IV Lasix, beta-blockers, Entresto.  Management as per cardiology.   Cardiogenic shock status post ventricular tachycardia Patient underwent electric cardioversion.    Currently on IV amiodarone drip for ventricular tachycardia.  Was on heparin drip, on Eliquis currently.   Continue aspirin, Lipitor, Coreg.  Goal is to keep potassium more than 4 and magnesium more than 2.  Paroxysmal atrial fibrillation.  Continue Coreg, amiodarone drip.  Was on heparin drip during hospitalization, now has been changed to Eliquis.  Acute kidney injury.   Improved.  Normal creatinine levels at this time.   Acute metabolic encephalopathy with delirium,  Patient required Haldol, diazepam and Precedex drip during ICU.  On IV thiamine empirically due to poor oral intake.  There is high possibility of hypoxic encephalopathy.  On as needed olanzapine dose has been decreased.  As needed Ativan for extreme agitation.  We will add a one-to-one sitter for now.  MRSA and Pseudomonas pneumonia.  Tracheal culture 11/14 positive for MRSA and pseudomonas. Repeat culture 11/21 showed no growth. Antibiotic course completed 11/23  Dysphagia.  Currently on enteral feeding at this  time.  Speech and swallow on board and recommend dysphagia one diet as able.  Mild hypomagnesemia.  Magnesium of 1.8 at this time.  Continue to replenish as necessary.  We will add IV 2 g of magnesium sulfate today.  Moderate protein calorie malnutrition dietary on board.  Has been started on tube feeds  Pressure injury 01/20/2020.  Sacral stage II ulceration.  Continue wound care.  Pressure Injury 01/20/20 Sacrum Posterior;Medial Stage 2 -  Partial thickness loss of dermis presenting as a shallow open injury with a red, pink wound bed without slough. (Active)  01/20/20 0800  Location: Sacrum  Location Orientation: Posterior;Medial  Staging: Stage 2 -  Partial thickness loss of dermis presenting as a shallow open injury with a red, pink wound bed without slough.  Wound Description (Comments):   Present on Admission: No   Ethics. patient has been seen by palliative care.  The decision was to proceed with current level of care.  Overall prognosis of the patient appears to be poor.  This was discussed with the patient's daughter at bedside yesterday  DVT prophylaxis: SCDs Start: 01/06/20 1332, was on heparin drip   Code Status: Full code  Family Communication:  None today.  Was able to speak with the patient's daughter at bedside yesterday at length.    Status is: Inpatient  Remains inpatient appropriate because:Unsafe d/c plan, IV treatments appropriate due to intensity of illness or inability to take PO and Inpatient level of care appropriate due to severity of illness, metabolic encephalopathy/need for skilled nursing facility placement, poor nutrition and need for enteral feeding.   Dispo: The patient is from: Home              Anticipated d/c is to: Skilled nursing facility placement as per physical therapy recommendation.              Anticipated d/c date is: 2 to 3 days              Patient currently is not medically stable to  d/c.   Consultants:  PCCM  Cardiology  Palliative care  Procedures:  NG tube placement 11/28 Intubation and extubation. Cardiac catheterization  01/21/20 -2D echocardiogram:LVEF 20 to 25% with severely decreased function and severe global hypokinesis. Left ventricle moderately dilated with mild left ventricular hypertrophy.Grade 1 diastolic dysfunction and elevated left atrial pressure.Left atrium mildly dilated mild mitral valve regurgitation.  01/24/20 - Right/Left Heart Cath:right heart catheterization showed mildly elevated filling pressures, minimal pulmonary hypertension and normal cardiac output previously placed proximal RCA to mid RCA stent is widely patent.No significant coronary artery disease overall.  Antibiotics:  . Completed course  Subjective:  Today, patient was seen and examined at bedside.  Still appears to be somnolent but following few commands including gripping hands.  Stating yes or no to few questions.  Objective: Vitals:   02/04/20 0300 02/04/20 0400  BP: 93/75 96/60  Pulse:    Resp: (!) 21 20  Temp:  98.8 F (37.1 C)  SpO2:      Intake/Output Summary (Last 24 hours) at 02/04/2020 0807 Last data filed at 02/04/2020 0500 Gross per 24 hour  Intake 1045.09 ml  Output 1350 ml  Net -304.91 ml   Filed Weights   01/28/20 0358 01/29/20 0400 01/30/20 0326  Weight: 71.9 kg 71.9 kg 72 kg  Body mass index is 24.85 kg/m.   Physical Exam: General: Patient is deconditioned, debilitated, thinly built, somnolent, following few commands.  NG tube in place. HENT:   No scleral pallor or icterus noted. Oral mucosa is moist.  Chest:   Diminished breath sounds bilaterally.  Coarse breath sounds noted. CVS: S1 &S2 heard.  Abdomen: Soft, nontender, nondistended.  Bowel sounds are heard.  Urinary catheter in place. Extremities: No cyanosis, clubbing or edema.  Peripheral pulses are palpable.  Right upper extremity PICC line in place.  Generalized  weakness of the extremities. Psych:  somnolent, moving extremities,  CNS: Somnolent, moving extremities, following few commands Skin: Warm and dry.  Stage II sacral ulceration.  Data Review: I have personally reviewed the following laboratory data and studies,  CBC: Recent Labs  Lab 01/31/20 0737 02/01/20 0250 02/02/20 0526 02/03/20 0551 02/04/20 0331  WBC 16.1* 18.3* 18.4* 15.6* 14.3*  NEUTROABS 10.8* 12.4* 12.7* 10.3* 8.0*  HGB 16.0 16.0 15.8 15.8 15.3  HCT 50.4 51.7 50.6 51.1 50.1  MCV 67.5* 68.8* 69.0* 70.1* 70.1*  PLT 919* 893* 868* 729* 807*   Basic Metabolic Panel: Recent Labs  Lab 01/31/20 0409 01/31/20 0737 02/01/20 0250 02/01/20 0250 02/01/20 1404 02/01/20 1404 02/02/20 0526 02/02/20 1616 02/03/20 0551 02/03/20 1852 02/04/20 0331  NA 138  --  140  --   --   --  139  --  140  --  138  K 3.5   < > 3.8  --  4.1  --  3.6  --  4.2  --  3.9  CL 104  --  109  --   --   --  107  --  109  --  106  CO2 22  --  20*  --   --   --  22  --  21*  --  22  GLUCOSE 265*  --  109*  --   --   --  121*  --  124*  --  144*  BUN 11  --  12  --   --   --  13  --  13  --  11  CREATININE 0.78  --  0.88  --   --   --  0.80  --  0.89  --  0.87  CALCIUM 8.4*  --  9.2  --   --   --  9.0  --  8.8*  --  9.0  MG  --    < > 2.1   < > 1.8   < > 1.6* 1.8 1.9 2.2 1.8  PHOS 3.2  --  3.6  --   --   --  3.8  --  3.4  --  2.9   < > = values in this interval not displayed.   Liver Function Tests: Recent Labs  Lab 01/31/20 0409  AST 18  ALT 12  ALKPHOS 66  BILITOT 0.8  PROT 6.9  ALBUMIN 2.6*   No results for input(s): LIPASE, AMYLASE in the last 168 hours. No results for input(s): AMMONIA in the last 168 hours. Cardiac Enzymes: No results for input(s): CKTOTAL, CKMB, CKMBINDEX, TROPONINI in the last 168 hours. BNP (last 3 results) Recent Labs    01/06/20 0821 01/13/20 0659 01/26/20 2339  BNP 1,046.8* 1,662.9* 1,886.4*    ProBNP (last 3 results) No results for input(s): PROBNP  in the last 8760 hours.  CBG: Recent Labs  Lab 02/03/20 1534 02/03/20 1911 02/03/20 2307 02/04/20 0348 02/04/20  0756  GLUCAP 174* 146* 166* 149* 134*   Recent Results (from the past 240 hour(s))  Expectorated sputum assessment w rflx to resp cult     Status: None   Collection Time: 01/26/20 11:39 PM   Specimen: Expectorated Sputum  Result Value Ref Range Status   Specimen Description EXPECTORATED SPUTUM  Final   Special Requests NONE  Final   Sputum evaluation   Final    THIS SPECIMEN IS ACCEPTABLE FOR SPUTUM CULTURE Performed at [facility], [address] [place], [place] [address]    Report Status 01/27/2020 FINAL  Final  Culture, respiratory     Status: None   Collection Time: 01/26/20 11:39 PM  Result Value Ref Range Status   Specimen Description   Final    EXPECTORATED SPUTUM Performed at [facility], [address] [place], [place] [address]    Special Requests   Final    NONE Reflexed from [id number] Performed at [facility], [address] [place], [place] [address]    Gram Stain   Final    MODERATE SQUAMOUS EPITHELIAL CELLS PRESENT FEW WBC PRESENT, PREDOMINANTLY PMN RARE GRAM POSITIVE COCCI RARE GRAM NEGATIVE RODS    Culture   Final    NO GROWTH 2 DAYS Performed at [facility], [address] [place], [place] [address]    Report Status 01/29/2020 FINAL  Final     Studies: DG Abd 1 View  Result Date: 02/02/2020 CLINICAL DATA:  Dobbhoff placement EXAM: ABDOMEN - 1 VIEW COMPARISON:  01/20/2020 FINDINGS: Portable radiograph of the lower chest and upper abdomen was obtained for the purposes of enteric tube localization. A Dobbhoff tube is seen coursing below the diaphragm with distal tip terminating within the gastric fundus. Visualized bowel gas pattern is nonobstructive. Cardiomegaly with prosthetic cardiac valve. IMPRESSION: Dobbhoff tube terminates within the gastric fundus. Electronically Signed   By: Duanne Guess D.O.   On: 02/02/2020 11:02      Joycelyn Das, MD  [facility] 02/04/2020

## 2020-02-04 NOTE — Progress Notes (Signed)
Progress Note  Patient Name: Travis Maxwell Date of Encounter: 02/04/2020  [facility] Cardiologist: Lorine Bears, MD   Subjective   Patient sleeping comfortably this morning, appears very somnolent, has mittens in arms.  Inpatient Medications    Scheduled Meds: . allopurinol  300 mg Oral Daily  . aspirin  81 mg Oral Daily  . atorvastatin  40 mg Oral QHS  . carvedilol  6.25 mg Oral BID WC  . chlorhexidine  15 mL Mouth Rinse BID  . Chlorhexidine Gluconate Cloth  6 each Topical Daily  . docusate  100 mg Oral BID  . feeding supplement (PROSource TF)  45 mL Per Tube TID  . fentaNYL (SUBLIMAZE) injection  25 mcg Intravenous Once  . free water  30 mL Per Tube Q4H  . furosemide  20 mg Intravenous Daily  . insulin aspart  0-9 Units Subcutaneous Q4H  . mouth rinse  15 mL Mouth Rinse q12n4p  . polyethylene glycol  17 g Oral Daily  . potassium chloride  20 mEq Oral Daily  . sacubitril-valsartan  1 tablet Oral BID  . senna-docusate  1 tablet Oral BID  . sodium chloride flush  10-40 mL Intracatheter Q12H  . sodium chloride flush  3 mL Intravenous Q12H  . spironolactone  12.5 mg Oral Daily  . thiamine injection  100 mg Intravenous Daily   Continuous Infusions: . sodium chloride 10 mL/hr at 02/04/20 0800  . amiodarone 30 mg/hr (02/04/20 0800)  . famotidine (PEPCID) IV 20 mg (02/04/20 0832)  . feeding supplement (OSMOLITE 1.5 CAL) 1,000 mL (02/03/20 1533)  . magnesium sulfate bolus IVPB     PRN Meds: sodium chloride, acetaminophen, albuterol, LORazepam, metoprolol tartrate, OLANZapine, ondansetron (ZOFRAN) IV, oxyCODONE, polyethylene glycol   Vital Signs    Vitals:   02/04/20 0300 02/04/20 0400 02/04/20 0800 02/04/20 0822  BP: 93/75 96/60 115/64 115/64  Pulse:   68 67  Resp: (!) 21 20 (!) 24   Temp:  98.8 F (37.1 C) 98 F (36.7 C)   TempSrc:  Oral Axillary   SpO2:   100%   Weight:      Height:        Intake/Output Summary (Last 24 hours) at 02/04/2020  0907 Last data filed at 02/04/2020 0800 Gross per 24 hour  Intake 1416.39 ml  Output 1550 ml  Net -133.61 ml   Last 3 Weights 01/30/2020 01/29/2020 01/28/2020  Weight (lbs) 158 lb 11.7 oz 158 lb 8.2 oz 158 lb 8.2 oz  Weight (kg) 72 kg 71.9 kg 71.9 kg      Telemetry    Sinus rhythm, heart rate 68- Personally Reviewed  ECG    Sinus rhythm- Personally Reviewed  Physical Exam   GEN:  Sleepy Neck: No JVD Cardiac: RRR, Respiratory:  Basal crackles GI: Soft, nontender, non-distended  MS: No edema; No deformity. Neuro:   Unable to assess Psych: Unable to assess  Labs    High Sensitivity Troponin:   Recent Labs  Lab 01/09/20 0152 01/16/20 0245 01/16/20 0702 01/16/20 0838 01/19/20 0433  TROPONINIHS 89* 934* 711* 674* 234*      Chemistry Recent Labs  Lab 01/31/20 0409 01/31/20 1744 02/02/20 0526 02/03/20 0551 02/04/20 0331  NA 138   < > 139 140 138  K 3.5   < > 3.6 4.2 3.9  CL 104   < > 107 109 106  CO2 22   < > 22 21* 22  GLUCOSE 265*   < > 121* 124*  144*  BUN 11   < > 13 13 11   CREATININE 0.78   < > 0.80 0.89 0.87  CALCIUM 8.4*   < > 9.0 8.8* 9.0  PROT 6.9  --   --   --   --   ALBUMIN 2.6*  --   --   --   --   AST 18  --   --   --   --   ALT 12  --   --   --   --   ALKPHOS 66  --   --   --   --   BILITOT 0.8  --   --   --   --   GFRNONAA >60   < > >60 >60 >60  ANIONGAP 12   < > 10 10 10    < > = values in this interval not displayed.     Hematology Recent Labs  Lab 02/02/20 0526 02/03/20 0551 02/04/20 0331  WBC 18.4* 15.6* 14.3*  RBC 7.33* 7.29* 7.15*  HGB 15.8 15.8 15.3  HCT 50.6 51.1 50.1  MCV 69.0* 70.1* 70.1*  MCH 21.6* 21.7* 21.4*  MCHC 31.2 30.9 30.5  RDW 28.9* 28.6* 28.3*  PLT 868* 729* 807*    BNPNo results for input(s): BNP, PROBNP in the last 168 hours.   DDimer No results for input(s): DDIMER in the last 168 hours.   Radiology    DG Abd 1 View  Result Date: 02/02/2020 CLINICAL DATA:  Dobbhoff placement EXAM: ABDOMEN - 1  VIEW COMPARISON:  01/20/2020 FINDINGS: Portable radiograph of the lower chest and upper abdomen was obtained for the purposes of enteric tube localization. A Dobbhoff tube is seen coursing below the diaphragm with distal tip terminating within the gastric fundus. Visualized bowel gas pattern is nonobstructive. Cardiomegaly with prosthetic cardiac valve. IMPRESSION: Dobbhoff tube terminates within the gastric fundus. Electronically Signed   By: 02/04/2020 D.O.   On: 02/02/2020 11:02    Cardiac Studies   Echo 01/21/20: 1. Left ventricular ejection fraction, by estimation, is 20 to 25%. The  left ventricle has severely decreased function. The left ventricle  demonstrates regional wall motion abnormalities (see scoring  diagram/findings for description). The left  ventricular internal cavity size was moderately dilated. There is mild  left ventricular hypertrophy. Left ventricular diastolic parameters are  consistent with Grade I diastolic dysfunction (impaired relaxation).  Elevated left atrial pressure. There is  severe global hypokinesis with relative sparing of the basal segments.  2. Right ventricular systolic function is moderately reduced. The right  ventricular size is mildly enlarged. Tricuspid regurgitation signal is  inadequate for assessing PA pressure.  3. Left atrial size was mildly dilated.  4. The mitral valve is grossly normal. Mild mitral valve regurgitation.  No evidence of mitral stenosis.  5. The aortic valve was not well visualized. Aortic valve regurgitation  is not visualized. Echo findings are consistent with normal structure and  function of the aortic valve prosthesis.  6. Pulmonic valve regurgitation not well assessed.  LHC 01/24/20:  Previously placed Prox RCA to Mid RCA stent (unknown type) is widely patent.  Prox RCA lesion is 50% stenosed.  1. Patent stent in a nondominant right coronary artery. No significant coronary artery disease  overall. 2. The replaced aortic valve was not crossed. 3. Right heart catheterization showed mildly elevated filling pressures, minimal pulmonary hypertension and normal cardiac output  Patient Profile     69 y.o. male with history of CAD/PCI to  RCA in 2019, diabetes, tobacco use, paroxysmal atrial fibrillation, NICM EF 20 to 25% who presents with shortness of breath, diagnosed with MRSA and Pseudomonas pneumonia. Hospital course complicated by persistent VT.   Assessment & Plan    1. VT -Maintaining sinus rhythm on amiodarone, no recurrence of arrhythmia -Continue IV amiodarone, patient not tolerating p.o., somnolent today. -Switch to p.o. amiodarone 400 mg twice daily when able  2. Paroxysmal atrial fibrillation -Maintaining sinus rhythm -Continue amiodarone, Coreg -Heparin drip -Eliquis when able to take p.o.  3. NICM EF 20-25% -Coreg, Entresto, Lasix  4. CAD/PCI to RCA -Aspirin, Lipitor.  5. Pneumonia/agitation, somnolence -abx, physical therapy, management as per icu team   Total encounter time 35 minutes  Greater than 50% was spent in counseling and coordination of care with the patient  Signed, Debbe Odea, MD  02/04/2020, 9:07 AM

## 2020-02-05 ENCOUNTER — Inpatient Hospital Stay: Payer: Medicare HMO

## 2020-02-05 LAB — BASIC METABOLIC PANEL
Anion gap: 11 (ref 5–15)
BUN: 14 mg/dL (ref 8–23)
CO2: 22 mmol/L (ref 22–32)
Calcium: 8.2 mg/dL — ABNORMAL LOW (ref 8.9–10.3)
Chloride: 104 mmol/L (ref 98–111)
Creatinine, Ser: 0.79 mg/dL (ref 0.61–1.24)
GFR, Estimated: >60 mL/min (ref >60)
Glucose, Bld: 306 mg/dL — ABNORMAL HIGH (ref 70–99)
Potassium: 3.7 mmol/L (ref 3.5–5.1)
Sodium: 137 mmol/L (ref 135–145)

## 2020-02-05 LAB — GLUCOSE, CAPILLARY
Glucose-Capillary: 117 mg/dL — ABNORMAL HIGH (ref 70–99)
Glucose-Capillary: 128 mg/dL — ABNORMAL HIGH (ref 70–99)
Glucose-Capillary: 128 mg/dL — ABNORMAL HIGH (ref 70–99)
Glucose-Capillary: 136 mg/dL — ABNORMAL HIGH (ref 70–99)
Glucose-Capillary: 167 mg/dL — ABNORMAL HIGH (ref 70–99)
Glucose-Capillary: 99 mg/dL (ref 70–99)

## 2020-02-05 LAB — CBC WITH DIFFERENTIAL/PLATELET
Abs Immature Granulocytes: 0.11 10*3/uL — ABNORMAL HIGH (ref 0.00–0.07)
Basophils Absolute: 0.3 10*3/uL — ABNORMAL HIGH (ref 0.0–0.1)
Basophils Relative: 2 %
Eosinophils Absolute: 1.1 10*3/uL — ABNORMAL HIGH (ref 0.0–0.5)
Eosinophils Relative: 10 %
HCT: 45.8 % (ref 39.0–52.0)
Hemoglobin: 14.4 g/dL (ref 13.0–17.0)
Immature Granulocytes: 1 %
Lymphocytes Relative: 20 %
Lymphs Abs: 2.2 10*3/uL (ref 0.7–4.0)
MCH: 21.9 pg — ABNORMAL LOW (ref 26.0–34.0)
MCHC: 31.4 g/dL (ref 30.0–36.0)
MCV: 69.5 fL — ABNORMAL LOW (ref 80.0–100.0)
Monocytes Absolute: 1 10*3/uL (ref 0.1–1.0)
Monocytes Relative: 9 %
Neutro Abs: 6.4 10*3/uL (ref 1.7–7.7)
Neutrophils Relative %: 58 %
Platelets: 655 10*3/uL — ABNORMAL HIGH (ref 150–400)
RBC: 6.59 MIL/uL — ABNORMAL HIGH (ref 4.22–5.81)
RDW: 28 % — ABNORMAL HIGH (ref 11.5–15.5)
Smear Review: NORMAL
WBC: 11.1 10*3/uL — ABNORMAL HIGH (ref 4.0–10.5)
nRBC: 0 % (ref 0.0–0.2)

## 2020-02-05 LAB — MAGNESIUM
Magnesium: 1.5 mg/dL — ABNORMAL LOW (ref 1.7–2.4)
Magnesium: 2.2 mg/dL (ref 1.7–2.4)

## 2020-02-05 LAB — PHOSPHORUS: Phosphorus: 3.5 mg/dL (ref 2.5–4.6)

## 2020-02-05 MED ORDER — OLANZAPINE 10 MG IM SOLR
5.0000 mg | Freq: Every evening | INTRAMUSCULAR | Status: DC
  Administered 2020-02-05 – 2020-02-07 (×3): 5 mg via INTRAMUSCULAR
  Filled 2020-02-05 (×3): qty 10

## 2020-02-05 MED ORDER — LORAZEPAM 2 MG/ML IJ SOLN
0.5000 mg | Freq: Once | INTRAMUSCULAR | Status: AC
  Administered 2020-02-05: 0.5 mg via INTRAVENOUS
  Filled 2020-02-05: qty 1

## 2020-02-05 MED ORDER — LORAZEPAM 2 MG/ML IJ SOLN
0.5000 mg | INTRAMUSCULAR | Status: DC | PRN
  Administered 2020-02-05 – 2020-02-07 (×7): 0.5 mg via INTRAVENOUS
  Filled 2020-02-05 (×8): qty 1

## 2020-02-05 MED ORDER — HALOPERIDOL LACTATE 5 MG/ML IJ SOLN: 1.0000 mg | Freq: Four times a day (QID) | INTRAMUSCULAR | Status: DC | PRN

## 2020-02-05 MED ORDER — MAGNESIUM SULFATE 4 GM/100ML IV SOLN
4.0000 g | Freq: Once | INTRAVENOUS | Status: AC
  Administered 2020-02-05: 4 g via INTRAVENOUS
  Filled 2020-02-05: qty 100

## 2020-02-05 MED ORDER — POTASSIUM CHLORIDE 20 MEQ PO PACK
40.0000 meq | PACK | Freq: Every day | ORAL | Status: DC
  Administered 2020-02-05 – 2020-02-06 (×2): 40 meq via ORAL
  Filled 2020-02-05 (×2): qty 2

## 2020-02-05 NOTE — Progress Notes (Signed)
Patient was transferred to PCU from ICU. Admitted to room 251 with 1:1 sitter. Patient is aggressive, hitting , kicking and spitting at staff. Amio gtt infusing, patient attempted to pull at lines. CN notified. Low bed, floor mats and sitter ordered.

## 2020-02-05 NOTE — Consult Note (Signed)
[facility] Face-to-Face Psychiatry Consult   Reason for Consult: Consult for this 69 year old man in the hospital with multiple medical problems with delirium Referring Physician:  Pokhrel Patient Identification: Kenric Ginger MRN:  [id number] Principal Diagnosis: Delirium Diagnosis:  Principal Problem:   Delirium Active Problems:   Acute on chronic systolic congestive heart failure (HCC)   Respiratory failure (HCC)   Malnutrition of moderate degree   Adult failure to thrive   Palliative care by specialist   DNR (do not resuscitate) discussion   Pressure injury of skin   Ventricular tachycardia (HCC)   Total Time spent with patient: 1 hour  Subjective:   Edel Rivero is a 69 y.o. male patient admitted with patient not able to give information.  HPI: Patient seen chart reviewed.  69 year old man admitted to the hospital with multiple medical problems.  Patient has had respiratory failure heart problems had a stay in the intensive care unit.  Has been confused disorganized and agitated for several days.  Requiring protective mitts to prevent him from harming himself or pulling out lines.  Patient was not able to give any information.  The nurse in the room told me that he was verbal and had back-and-forth conversations with her.  With me he said nothing despite multiple attempts on my part to engage him in conversation.  He was picking at the sheets and at his clothing trying to crawl out of bed and looked very confused.  Past Psychiatric History: There does not seem to be any significant past psychiatric history  Risk to Self:   Risk to Others:   Prior Inpatient Therapy:   Prior Outpatient Therapy:    Past Medical History:  Past Medical History:  Diagnosis Date  . CAD (coronary artery disease)   . Chronic systolic (congestive) heart failure (HCC)   . Diabetes mellitus without complication (HCC)   . GERD (gastroesophageal reflux disease)   . Gout   . Hx of aortic valve replacement     Bioprosthetic valve  . Hypertension     Past Surgical History:  Procedure Laterality Date  . CORONARY STENT INTERVENTION N/A 07/25/2017   Procedure: CORONARY STENT INTERVENTION;  Surgeon: Alwyn Pea, MD;  Location: [facility] INVASIVE CV LAB;  Service: Cardiovascular;  Laterality: N/A;  . LEFT HEART CATH AND CORONARY ANGIOGRAPHY N/A 07/25/2017   Procedure: LEFT HEART CATH AND CORONARY ANGIOGRAPHY;  Surgeon: Lamar Blinks, MD;  Location: [facility] INVASIVE CV LAB;  Service: Cardiovascular;  Laterality: N/A;  . RIGHT/LEFT HEART CATH AND CORONARY ANGIOGRAPHY N/A 01/24/2020   Procedure: RIGHT/LEFT HEART CATH AND CORONARY ANGIOGRAPHY;  Surgeon: Iran Ouch, MD;  Location: [facility] INVASIVE CV LAB;  Service: Cardiovascular;  Laterality: N/A;   Family History:  Family History  Problem Relation Age of Onset  . CAD Mother   . CAD Father    Family Psychiatric  History: None reported Social History:  Social History   Substance and Sexual Activity  Alcohol Use Not Currently     Social History   Substance and Sexual Activity  Drug Use Yes  . Types: Marijuana   Comment: today    Social History   Socioeconomic History  . Marital status: Divorced    Spouse name: Not on file  . Number of children: Not on file  . Years of education: Not on file  . Highest education level: Not on file  Occupational History  . Not on file  Tobacco Use  . Smoking status: Current Every Day Smoker  Packs/day: 0.50    Types: Cigarettes    Last attempt to quit: 08/10/2017    Years since quitting: 2.4  . Smokeless tobacco: Never Used  Vaping Use  . Vaping Use: Never used  Substance and Sexual Activity  . Alcohol use: Not Currently  . Drug use: Yes    Types: Marijuana    Comment: today  . Sexual activity: Not on file  Other Topics Concern  . Not on file  Social History Narrative  . Not on file   Social Determinants of Health   Financial Resource Strain:   . Difficulty of Paying Living  Expenses: Not on file  Food Insecurity:   . Worried About Programme researcher, broadcasting/film/video in the Last Year: Not on file  . Ran Out of Food in the Last Year: Not on file  Transportation Needs:   . Lack of Transportation (Medical): Not on file  . Lack of Transportation (Non-Medical): Not on file  Physical Activity:   . Days of Exercise per Week: Not on file  . Minutes of Exercise per Session: Not on file  Stress:   . Feeling of Stress : Not on file  Social Connections:   . Frequency of Communication with Friends and Family: Not on file  . Frequency of Social Gatherings with Friends and Family: Not on file  . Attends Religious Services: Not on file  . Active Member of Clubs or Organizations: Not on file  . Attends Banker Meetings: Not on file  . Marital Status: Not on file   Additional Social History:    Allergies:   Allergies  Allergen Reactions  . Penicillins Other (See Comments)    Has patient had a PCN reaction causing immediate rash, facial/tongue/throat swelling, SOB or lightheadedness with hypotension: Unknown Has patient had a PCN reaction causing severe rash involving mucus membranes or skin necrosis: Unknown Has patient had a PCN reaction that required hospitalization: Unknown Has patient had a PCN reaction occurring within the last 10 years: No If all of the above answers are "NO", then may proceed with Cephalosporin use.     Labs:  Results for orders placed or performed during the hospital encounter of 01/06/20 (from the past 48 hour(s))  Magnesium     Status: None   Collection Time: 02/03/20  6:52 PM  Result Value Ref Range   Magnesium 2.2 1.7 - 2.4 mg/dL    Comment: Performed at [facility], [address] [place], [place] [address]  Glucose, capillary     Status: Abnormal   Collection Time: 02/03/20  7:11 PM  Result Value Ref Range   Glucose-Capillary 146 (H) 70 - 99 mg/dL    Comment: Glucose reference range applies only to samples taken after  fasting for at least 8 hours.  Heparin level (unfractionated)     Status: None   Collection Time: 02/03/20  9:57 PM  Result Value Ref Range   Heparin Unfractionated 0.51 0.30 - 0.70 IU/mL    Comment: (NOTE) If heparin results are below expected values, and patient dosage has  been confirmed, suggest follow up testing of antithrombin III levels. Performed at [facility], [address] [place], [place] [address]   Glucose, capillary     Status: Abnormal   Collection Time: 02/03/20 11:07 PM  Result Value Ref Range   Glucose-Capillary 166 (H) 70 - 99 mg/dL    Comment: Glucose reference range applies only to samples taken after fasting for at least 8 hours.  Glucose, capillary     Status: Abnormal   Collection Time: 02/03/20 11:07 PM  Result Value Ref Range   Glucose-Capillary 166 (H) 70 - 99 mg/dL    Comment: Glucose reference range applies only to samples taken after fasting for at least 8 hours.  Magnesium     Status: None   Collection Time: 02/04/20  3:31 AM  Result Value Ref Range   Magnesium 1.8 1.7 - 2.4 mg/dL    Comment: Performed at [facility], [address] [place], [place] [address]  Phosphorus     Status: None   Collection Time: 02/04/20  3:31 AM  Result Value Ref Range   Phosphorus 2.9 2.5 - 4.6 mg/dL    Comment: Performed at [facility], [address] [place], [place] [address]  Basic metabolic panel     Status: Abnormal   Collection Time: 02/04/20  3:31 AM  Result Value Ref Range   Sodium 138 135 - 145 mmol/L   Potassium 3.9 3.5 - 5.1 mmol/L   Chloride 106 98 - 111 mmol/L   CO2 22 22 - 32 mmol/L   Glucose, Bld 144 (H) 70 - 99 mg/dL    Comment: Glucose reference range applies only to samples taken after fasting for at least 8 hours.   BUN 11 8 - 23 mg/dL   Creatinine, Ser 6.94 0.61 - 1.24 mg/dL   Calcium 9.0 8.9 - 50.3 mg/dL   GFR, Estimated >88 >82 mL/min    Comment: (NOTE) Calculated using the CKD-EPI Creatinine Equation  (2021)    Anion gap 10 5 - 15    Comment: Performed at [facility], [address] [place], [place] [address]  CBC with Differential/Platelet     Status: Abnormal   Collection Time: 02/04/20  3:31 AM  Result Value Ref Range   WBC 14.3 (H) 4.0 - 10.5 K/uL   RBC 7.15 (H) 4.22 - 5.81 MIL/uL   Hemoglobin 15.3 13.0 - 17.0 g/dL   HCT 91.7 39 - 52 %   MCV 70.1 (L) 80.0 - 100.0 fL   MCH 21.4 (L) 26.0 - 34.0 pg   MCHC 30.5 30.0 - 36.0 g/dL   RDW 91.5 (H) 05.6 - 97.9 %   Platelets 807 (H) 150 - 400 K/uL   nRBC 0.0 0.0 - 0.2 %   Neutrophils Relative % 56 %   Neutro Abs 8.0 (H) 1.7 - 7.7 K/uL   Lymphocytes Relative 24 %   Lymphs Abs 3.4 0.7 - 4.0 K/uL   Monocytes Relative 8 %   Monocytes Absolute 1.2 (H) 0.1 - 1.0 K/uL   Eosinophils Relative 9 %   Eosinophils Absolute 1.3 (H) 0.0 - 0.5 K/uL   Basophils Relative 2 %   Basophils Absolute 0.3 (H) 0.0 - 0.1 K/uL   WBC Morphology MORPHOLOGY UNREMARKABLE    RBC Morphology MORPHOLOGY UNREMARKABLE    Smear Review Normal platelet morphology    Immature Granulocytes 1 %   Abs Immature Granulocytes 0.14 (H) 0.00 - 0.07 K/uL   Dimorphism PRESENT     Comment: Performed at [facility], [address] [place], [place] [address]  Glucose, capillary     Status: Abnormal   Collection Time: 02/04/20  3:48 AM  Result Value Ref Range   Glucose-Capillary 149 (H) 70 - 99 mg/dL    Comment: Glucose reference range applies only to samples taken after fasting for at least 8 hours.  Heparin level (unfractionated)     Status:  None   Collection Time: 02/04/20  7:02 AM  Result Value Ref Range   Heparin Unfractionated 0.69 0.30 - 0.70 IU/mL    Comment: (NOTE) If heparin results are below expected values, and patient dosage has  been confirmed, suggest follow up testing of antithrombin III levels. Performed at [facility], [address] [place], [place] [address]   Glucose, capillary     Status: Abnormal   Collection Time:  02/04/20  7:56 AM  Result Value Ref Range   Glucose-Capillary 134 (H) 70 - 99 mg/dL    Comment: Glucose reference range applies only to samples taken after fasting for at least 8 hours.  Glucose, capillary     Status: Abnormal   Collection Time: 02/04/20 11:28 AM  Result Value Ref Range   Glucose-Capillary 153 (H) 70 - 99 mg/dL    Comment: Glucose reference range applies only to samples taken after fasting for at least 8 hours.  Glucose, capillary     Status: Abnormal   Collection Time: 02/04/20  3:48 PM  Result Value Ref Range   Glucose-Capillary 126 (H) 70 - 99 mg/dL    Comment: Glucose reference range applies only to samples taken after fasting for at least 8 hours.  Magnesium     Status: None   Collection Time: 02/04/20  4:00 PM  Result Value Ref Range   Magnesium 1.8 1.7 - 2.4 mg/dL    Comment: Performed at [facility], [address] [place], [place] [address]  Glucose, capillary     Status: Abnormal   Collection Time: 02/04/20  7:39 PM  Result Value Ref Range   Glucose-Capillary 172 (H) 70 - 99 mg/dL    Comment: Glucose reference range applies only to samples taken after fasting for at least 8 hours.  Glucose, capillary     Status: None   Collection Time: 02/04/20 11:13 PM  Result Value Ref Range   Glucose-Capillary 93 70 - 99 mg/dL    Comment: Glucose reference range applies only to samples taken after fasting for at least 8 hours.  Glucose, capillary     Status: Abnormal   Collection Time: 02/05/20  3:18 AM  Result Value Ref Range   Glucose-Capillary 128 (H) 70 - 99 mg/dL    Comment: Glucose reference range applies only to samples taken after fasting for at least 8 hours.  Magnesium     Status: Abnormal   Collection Time: 02/05/20  3:49 AM  Result Value Ref Range   Magnesium 1.5 (L) 1.7 - 2.4 mg/dL    Comment: Performed at [facility], [address] [place], [place] [address]  Phosphorus     Status: None   Collection Time: 02/05/20  3:49 AM   Result Value Ref Range   Phosphorus 3.5 2.5 - 4.6 mg/dL    Comment: Performed at [facility], [address] [place], [place] [address]  Basic metabolic panel     Status: Abnormal   Collection Time: 02/05/20  3:49 AM  Result Value Ref Range   Sodium 137 135 - 145 mmol/L   Potassium 3.7 3.5 - 5.1 mmol/L   Chloride 104 98 - 111 mmol/L   CO2 22 22 - 32 mmol/L   Glucose, Bld 306 (H) 70 - 99 mg/dL    Comment: Glucose reference range applies only to samples taken after fasting for at least 8 hours.   BUN 14 8 - 23 mg/dL   Creatinine, Ser 5.36 0.61 - 1.24 mg/dL  Calcium 8.2 (L) 8.9 - 10.3 mg/dL   GFR, Estimated >40 >98 mL/min    Comment: (NOTE) Calculated using the CKD-EPI Creatinine Equation (2021)    Anion gap 11 5 - 15    Comment: Performed at [facility], [address] [place], [place] [address]  CBC with Differential/Platelet     Status: Abnormal   Collection Time: 02/05/20  3:49 AM  Result Value Ref Range   WBC 11.1 (H) 4.0 - 10.5 K/uL   RBC 6.59 (H) 4.22 - 5.81 MIL/uL   Hemoglobin 14.4 13.0 - 17.0 g/dL   HCT 78.2 39 - 52 %   MCV 69.5 (L) 80.0 - 100.0 fL   MCH 21.9 (L) 26.0 - 34.0 pg   MCHC 31.4 30.0 - 36.0 g/dL   RDW 95.6 (H) 21.3 - 08.6 %   Platelets 655 (H) 150 - 400 K/uL   nRBC 0.0 0.0 - 0.2 %   Neutrophils Relative % 58 %   Neutro Abs 6.4 1.7 - 7.7 K/uL   Lymphocytes Relative 20 %   Lymphs Abs 2.2 0.7 - 4.0 K/uL   Monocytes Relative 9 %   Monocytes Absolute 1.0 0.1 - 1.0 K/uL   Eosinophils Relative 10 %   Eosinophils Absolute 1.1 (H) 0.0 - 0.5 K/uL   Basophils Relative 2 %   Basophils Absolute 0.3 (H) 0.0 - 0.1 K/uL   WBC Morphology MORPHOLOGY UNREMARKABLE    RBC Morphology MORPHOLOGY UNREMARKABLE    Smear Review Normal platelet morphology    Immature Granulocytes 1 %   Abs Immature Granulocytes 0.11 (H) 0.00 - 0.07 K/uL   Dimorphism PRESENT     Comment: Performed at [facility], [address] [place], [place] [address]   Glucose, capillary     Status: Abnormal   Collection Time: 02/05/20  7:25 AM  Result Value Ref Range   Glucose-Capillary 136 (H) 70 - 99 mg/dL    Comment: Glucose reference range applies only to samples taken after fasting for at least 8 hours.   Comment 1 Notify RN   Glucose, capillary     Status: None   Collection Time: 02/05/20 11:28 AM  Result Value Ref Range   Glucose-Capillary 99 70 - 99 mg/dL    Comment: Glucose reference range applies only to samples taken after fasting for at least 8 hours.   Comment 1 Notify RN   Magnesium     Status: None   Collection Time: 02/05/20  3:50 PM  Result Value Ref Range   Magnesium 2.2 1.7 - 2.4 mg/dL    Comment: Performed at [facility], [address] [place], [place] [address]  Glucose, capillary     Status: Abnormal   Collection Time: 02/05/20  4:50 PM  Result Value Ref Range   Glucose-Capillary 167 (H) 70 - 99 mg/dL    Comment: Glucose reference range applies only to samples taken after fasting for at least 8 hours.    Current Facility-Administered Medications  Medication Dose Route Frequency Provider Last Rate Last Admin  . 0.9 %  sodium chloride infusion   Intravenous PRN Vida Rigger, MD   Stopped at 02/05/20 2841  . acetaminophen (TYLENOL) tablet 650 mg  650 mg Oral Q4H PRN Kasa, Kurian, MD      . albuterol (VENTOLIN HFA) 108 (90 Base) MCG/ACT inhaler 2 puff  2 puff Inhalation Q4H PRN Lorine Bears A, MD      . allopurinol (ZYLOPRIM) tablet 300 mg  300 mg Oral  Daily Erin Fulling, MD   300 mg at 02/05/20 1349  . amiodarone (NEXTERONE PREMIX) 360-4.14 MG/200ML-% (1.8 mg/mL) IV infusion  30 mg/hr Intravenous Continuous Debbe Odea, MD 16.67 mL/hr at 02/05/20 1048 30 mg/hr at 02/05/20 1048  . apixaban (ELIQUIS) tablet 5 mg  5 mg Oral BID Vida Rigger, MD   5 mg at 02/05/20 1349  . aspirin chewable tablet 81 mg  81 mg Oral Daily Erin Fulling, MD   81 mg at 02/05/20 1350  . atorvastatin (LIPITOR) tablet 40 mg  40  mg Oral QHS Erin Fulling, MD   40 mg at 02/04/20 2209  . carvedilol (COREG) tablet 6.25 mg  6.25 mg Oral BID WC Debbe Odea, MD   6.25 mg at 02/04/20 1615  . chlorhexidine (PERIDEX) 0.12 % solution 15 mL  15 mL Mouth Rinse BID Rust-Chester, Britton L, NP   15 mL at 02/05/20 1118  . Chlorhexidine Gluconate Cloth 2 % PADS 6 each  6 each Topical Daily Iran Ouch, MD   6 each at 02/04/20 1128  . docusate (COLACE) 50 MG/5ML liquid 100 mg  100 mg Oral BID Erin Fulling, MD   100 mg at 02/04/20 2208  . famotidine (PEPCID) IVPB 20 mg premix  20 mg Intravenous Q24H Lorine Bears A, MD 100 mL/hr at 02/05/20 1124 20 mg at 02/05/20 1124  . feeding supplement (PROSource TF) liquid 45 mL  45 mL Per Tube TID Pokhrel, Laxman, MD   45 mL at 02/04/20 2207  . furosemide (LASIX) injection 20 mg  20 mg Intravenous Daily Lorine Bears A, MD   20 mg at 02/05/20 1118  . insulin aspart (novoLOG) injection 0-9 Units  0-9 Units Subcutaneous Q4H Iran Ouch, MD   2 Units at 02/05/20 1711  . LORazepam (ATIVAN) injection 0.5 mg  0.5 mg Intravenous Q4H PRN Soma Lizak T, MD      . MEDLINE mouth rinse  15 mL Mouth Rinse q12n4p Rust-Chester, Britton L, NP   15 mL at 02/05/20 1350  . melatonin tablet 5 mg  5 mg Oral QHS Vida Rigger, MD   5 mg at 02/04/20 2209  . metoprolol tartrate (LOPRESSOR) injection 5 mg  5 mg Intravenous Q2H PRN Iran Ouch, MD   5 mg at 01/25/20 1259  . OLANZapine (ZYPREXA) injection 5 mg  5 mg Intramuscular QPM Pokhrel, Laxman, MD   5 mg at 02/05/20 1701  . ondansetron (ZOFRAN) injection 4 mg  4 mg Intravenous Q6H PRN Lorine Bears A, MD      . polyethylene glycol (MIRALAX / GLYCOLAX) packet 17 g  17 g Oral Daily Erin Fulling, MD   17 g at 02/04/20 0823  . polyethylene glycol (MIRALAX / GLYCOLAX) packet 17 g  17 g Oral Daily PRN Erin Fulling, MD      . potassium chloride (KLOR-CON) packet 40 mEq  40 mEq Oral Daily [name], RPH   40 mEq at 02/05/20 1350  .  sacubitril-valsartan (ENTRESTO) 24-26 mg per tablet  1 tablet Oral BID End, Christopher, MD   1 tablet at 02/04/20 2208  . senna-docusate (Senokot-S) tablet 1 tablet  1 tablet Oral BID Erin Fulling, MD   1 tablet at 02/04/20 2209  . sodium chloride flush (NS) 0.9 % injection 10-40 mL  10-40 mL Intracatheter Q12H Lorine Bears A, MD   10 mL at 02/05/20 1126  . sodium chloride flush (NS) 0.9 % injection 3 mL  3 mL Intravenous Q12H Lorine Bears  A, MD   3 mL at 02/04/20 2210  . spironolactone (ALDACTONE) tablet 12.5 mg  12.5 mg Oral Daily Debbe Odea, MD   12.5 mg at 02/05/20 1349    Musculoskeletal: Strength & Muscle Tone: decreased Gait & Station: unable to stand Patient leans: N/A  Psychiatric Specialty Exam: Physical Exam Vitals and nursing note reviewed.  Constitutional:      Appearance: He is well-developed.  HENT:     Head: Normocephalic and atraumatic.  Eyes:     Conjunctiva/sclera: Conjunctivae normal.     Pupils: Pupils are equal, round, and reactive to light.  Cardiovascular:     Heart sounds: Normal heart sounds.  Pulmonary:     Effort: Pulmonary effort is normal.  Abdominal:     Palpations: Abdomen is soft.  Musculoskeletal:        General: Normal range of motion.     Cervical back: Normal range of motion.  Skin:    General: Skin is warm and dry.  Neurological:     Mental Status: He is alert.  Psychiatric:        Attention and Perception: He is inattentive.        Mood and Affect: Affect is flat.        Speech: He is noncommunicative.        Behavior: Behavior is agitated.        Cognition and Memory: Cognition is impaired.     Review of Systems  Unable to perform ROS: Patient nonverbal    Blood pressure 124/80, pulse 94, temperature 98.7 F (37.1 C), temperature source Oral, resp. rate 20, height 5' 7.01" (1.702 m), weight 66.7 kg, SpO2 90 %.Body mass index is 23.03 kg/m.  General Appearance: Disheveled  Eye Contact:  None  Speech:  Negative   Volume:  Decreased  Mood:  Negative  Affect:  Negative  Thought Process:  NA  Orientation:  Negative  Thought Content:  Negative  Suicidal Thoughts:  No  Homicidal Thoughts:  No  Memory:  Negative  Judgement:  Negative  Insight:  Negative  Psychomotor Activity:  Negative  Concentration:  Concentration: Negative  Recall:  Negative  Fund of Knowledge:  Negative  Language:  Negative  Akathisia:  Negative  Handed:  Right  AIMS (if indicated):     Assets:  Social Support  ADL's:  Impaired  Cognition:  Impaired,  Severe  Sleep:        Treatment Plan Summary: Medication management and Plan 69 year old man with multiple medical problems.  Continues apparently to be delirious.  Looking through old notes and from my interaction with him now does not sound like he has been able to engage in any kind of dialogue or follow commands.  Question was about safe medication management.  Normally the first line choice for this sort of thing would be intravenous haloperidol or other antipsychotics and I do see that he is getting a modest dose of olanzapine orally at night.  The problem in this situation is that his last EKG showed a QT interval almost 600.  This makes the idea of adding any medicine that could make that worse pretty questionable.  Therefore I am going to withhold adding any haloperidol or other antipsychotics and just order Ativan.  Usually I warned against Ativan for delirium but if it keeps him sedated enough to not fall out of bed or hurt himself it would probably be better than nothing.  I can order another EKG and we can see whether  there is been any improvement in his interval that might allow for more flexibility in medicine dosing.  Disposition: Patient does not meet criteria for psychiatric inpatient admission. Supportive therapy provided about ongoing stressors.  Mordecai Rasmussen, MD 02/05/2020 6:15 PM

## 2020-02-05 NOTE — Progress Notes (Signed)
PHARMACY CONSULT NOTE  Pharmacy Consult for Electrolyte Monitoring and Replacement   Recent Labs: Potassium (mmol/L)  Date Value  02/05/2020 3.7   Magnesium (mg/dL)  Date Value  33/29/5188 1.5 (L)   Calcium (mg/dL)  Date Value  41/66/0630 8.2 (L)   Albumin (g/dL)  Date Value  16/03/930 2.6 (L)   Phosphorus (mg/dL)  Date Value  35/57/3220 3.5   Sodium (mmol/L)  Date Value  02/05/2020 137   Assessment: 69 year old male transferred to ICU for acute toxic metabolic encephalopathy. Overnight 11/10, patient with two runs pulseless VT, with second episode requiring CPR and defibrillation. Converted back into baseline bundle branch rhythm with rate in 50's-60's.  Similar episodes 11/11 requiring defibrillation. Pharmacy to manage electrolytes.  Patient intubated 11/14 and extubated 11/20   Diuretics: IV Lasix 20 mg daily + Aldactone 12.5 mg daily  Goal of Therapy:  Potassium 4.0 - 5.1 mmol/L Magnesium 2.0 - 2.4 mg/dL All other electrolytes within normal limits  Plan:   Mg 1.5, provider ordered IV magnesium sulfate 4 g x 1  K 3.7, will modify PO KCl to 40 mEq daily to be given when Lasix given and held when Lasix held  Follow-up electrolytes with AM labs  [name]  02/05/2020 8:50 AM

## 2020-02-05 NOTE — Progress Notes (Signed)
CRITICAL CARE NOTE  [name] presenting to the ED on 01/06/2020 in respiratory distress, complaining of chest pain and shortness of breath. He also reported orthopnea and increased lower extremity swelling. History was limited due to acuity of condition and respiratory status. Patient was placed on biPAP, but progressively worsened and was emergently intubated. Patient is a current 0.5ppd smoker and uses marijuana. He has a severe metabolic acidosis. Patient has a past medical history significant for HTN, T2DM, HLD, GERD, CAD (s/p PCI), AVR with bioprosthetic valve, systolic CHF with EF of 70%, and paroxysmal A. Fib, currently anticoagulated on Eliquis. EKG upon arrival to ED revealed sinus rhythm at a rate of 99bpm with RBBB and PVCs with ST elevation in II, III, avF, V4-V6. Cardiologist on call stated that STEMI criteria were not met and advised to manage CHF. Patient received IV diltiazem and reported improvement of symptoms. Labs were notable for high sensitivity troponin 71->91->89. BNP 1046, creatinine 1.26, BUN 21, potassium 5.4. CXR showed improved pulmonary edema since prior study with chronic pulmonary venous hypertension. Notably, patient was admitted to [facility] ED 12/04/2019 for acute respiratory failure and acute on chronic systolic CHF in the setting of COVID pneumonia.   SIGNIFICANT EVENTS  11/02 - severe cardiogenic shock, remains on vent 11/03 - severe respiratory failure, severe systolic CHF 26/37 - patient remains critically ill 11/05 - patient remains critically ill 11/06 - off pressors, extubated to biPAP 11/07 - remains extubated, febrile, MSSA+ tracheal culture - started ancef 11/08 - remains extubated, on 2L by nasal cannula. Afebrile today with white count of 21.3K, up from 15.7K yesterday. Still encephalopathic and drowsy. On and off Precedex. Went into narrow complex SVT,then progressed to wide complex tachycardiawas shocked and given a loading dose of amiodarone. Will  remain on amiodarone gtt.Cardiology at bedside.  11/09 - remains extubated, on 2L by nasal cannula. Afebrile today with white count of 19.2K, down since yesterday. BP soft overnight, was started on Neo-synephrine infusion. More alert than yesterday, but only oriented to self. Failed Yale 3oz swallow study early this morning, started coughing at the end of the study. Will need to remain NPO until cleared by SLP. Patient has been educated on this, yet continues to incessantly ask for water.  11/10 - patient clinically improved. He is alert communicative and in no distress. He ate entire meal without issues. Still has confusion intermittently during interview. Frequent PVCs on telemetry.  Will initiate OT/PT today for downgrade planning. 11/11 - patient minimally symptomatic, he again had episodes of Torsades today s/p 120Jx1 mag 2g.  Poor prognosis overall with advanced CHF.  11/12 - Dicussed case with cardiology. Antiarrythmic agents being modified. Will also call consult for Electrophysiology  11/13 - Patient with no arrythmia overnight. He remains on precedex and neosynephrine.  I spoke with cardiology Dr Rayann Heman regarding EP evaluation- he recommended to continue with current medical therapy and have cardiologist call to EP on Monday to review options.  He has 1:1 sitter and is resting in bed comfortably. Spoke with Velva Harman POA daughter to update on poor prognosis worsening clinical condition.  11/14 - further deterioration overnight.  Patient required reintubation and MV.  Updated next of kin daughter Velva Harman this am and sent message to cardiology to discuss poor prognosis with family.    11/15 - remains intubated and sedated, high risk for cardiac arrest and death Feb 10, 2023 - remains intubated and sedated, frequent PVCs overnight, 2g magnesium given, awaiting EP consult, patient becomes tachypneic and diaphoretic during  SBTs 11/17 - increased ectopy, seen by EP who advised weaning lidocaine and restarting  amiodarone, planning for right & left heart cath on Friday 11/19 with Dr. Fletcher Anon, discontinue Eliquis, start IV heparin interim, tracheal culture 11/14 positive for MRSA and pseudomonas, started cefepime and linezolid 11/18 - remains intubated and sedated, on continuous infusion of amiodarone and lidocaine, metoprolol discontinued by cardiology due to low blood pressures, right/left heart cath scheduled for tomorrow 11/19 - pt s/p L&RHC no obstructive lesions, RHC with normal pressures. Patient will be treated medically with transition to PO amio and plan for SBT with attempt to liberate.  11/20 - Patient was liberated from ventilator.  He had persistent VT post extubation, was fortunate to have cardiologist present on time and had immediate medical intervention with improvement. Still very poor prognosis long term. Spoke to granddaugther at bedside requested family meeting to re-address goals of care due to limited therapy available for patient.  11/21 - Patient was able to stay off ventilator last night. He was started on Precedex overnight and is currently sedated to RASS-1 on nasal canula, plan to wean down/off sedation.  11/22 - Patient sitting up in bed, no obvious distress on BiPAP, no acute events overnight, attempt transition to nasal cannula, on NTG drip for BP control per cardiology until PO meds can be taken 11/23 - Patient required Haldol and Valium overnight for agitation, on 2L by [place] today, passed bedside swallow study this morning, can transition to PO meds, Foley removed 11/24 - Patient remains extubated and off pressors, breathing comfortably on room air, antibiotics completed yesterday, PT/OT today, fluctuating alertness, transitioned from IV amiodarone and heparin to PO amiodarone and Eliquis  11/29- patient is encephalopathic.  Working on swallow eval and PO meds to transition out of MICU 02/04/20- patient confused with 1:1 sitter.    No episodes of arrythmia.  02/05/20- patient  continues to have episodes of sundowning in evenings.  Ive discussed this with attending Dr Louanne Belton, we have psychiatry on consult request.    CC  Follow up respiratory failure  SUBJECTIVE Chronic ill appearing Prognosis is guarded Confused, speech following Cardiology following   BP 109/68   Pulse (!) 55   Temp 97.7 F (36.5 C) (Axillary)   Resp (!) 23   Ht 5' 7.01" (1.702 m)   Wt 66.7 kg   SpO2 97%   BMI 23.03 kg/m    I/O last 3 completed shifts: In: 1088.3 [I.V.:963.3; NG/GT:75; IV Piggyback:50] Out: 3400 [Urine:3400] Total I/O In: 25.3 [I.V.:25.3] Out: 850 [Urine:850]  SpO2: 97 % O2 Flow Rate (L/min): 2 L/min FiO2 (%): 28 %  Estimated body mass index is 23.03 kg/m as calculated from the following:   Height as of this encounter: 5' 7.01" (1.702 m).   Weight as of this encounter: 66.7 kg.   REVIEW OF SYSTEMS  ROS limited due toseverecritical illness. Respiratory:positive for intermittent cough,negative for shortness of breath Cardiovascular:negative for palpitations, chest pain, lower extremity swelling Gastrointestinal:negative for abdominal pain, nausea/vomiting Neurological:negative for lightheadedness, syncope, headaches  Pressure Injury 01/20/20 Sacrum Posterior;Medial Stage 2 -  Partial thickness loss of dermis presenting as a shallow open injury with a red, pink wound bed without slough. (Active)  01/20/20 0800  Location: Sacrum  Location Orientation: Posterior;Medial  Staging: Stage 2 -  Partial thickness loss of dermis presenting as a shallow open injury with a red, pink wound bed without slough.  Wound Description (Comments):   Present on Admission: No    PHYSICAL EXAMINATION:  GENERAL: Chronically ill appearing, NAD, conversational, difficult to understand HEAD: Normocephalic, atraumatic.  EYES: Pupils equal, round, reactive to light.  No scleral icterus.  MOUTH: Moist mucosal membrane. NECK: Supple. No JVD. PULMONARY: Mildly  tachypneic, unlabored, coarse breath sounds bilaterally CARDIOVASCULAR: S1 and S2. Regular rate and rhythm. No murmurs, rubs, or gallops.  GASTROINTESTINAL: Soft, nontender, non-distended.  Positive bowel sounds.   MUSCULOSKELETAL: No swelling, clubbing, or edema.  NEUROLOGIC: A&O x 1, follows simple commands, confused GCS<11 SKIN: Intact, warm, dry  MEDICATIONS: I have reviewed all medications and confirmed regimen as documented Scheduled Meds: . allopurinol  300 mg Oral Daily  . apixaban  5 mg Oral BID  . aspirin  81 mg Oral Daily  . atorvastatin  40 mg Oral QHS  . carvedilol  6.25 mg Oral BID WC  . chlorhexidine  15 mL Mouth Rinse BID  . Chlorhexidine Gluconate Cloth  6 each Topical Daily  . docusate  100 mg Oral BID  . feeding supplement (PROSource TF)  45 mL Per Tube TID  . furosemide  20 mg Intravenous Daily  . insulin aspart  0-9 Units Subcutaneous Q4H  . mouth rinse  15 mL Mouth Rinse q12n4p  . melatonin  5 mg Oral QHS  . polyethylene glycol  17 g Oral Daily  . potassium chloride  40 mEq Oral Daily  . sacubitril-valsartan  1 tablet Oral BID  . senna-docusate  1 tablet Oral BID  . sodium chloride flush  10-40 mL Intracatheter Q12H  . sodium chloride flush  3 mL Intravenous Q12H  . spironolactone  12.5 mg Oral Daily  . thiamine injection  100 mg Intravenous Daily   Continuous Infusions: . sodium chloride Stopped (02/05/20 0628)  . amiodarone 30 mg/hr (02/05/20 1048)  . famotidine (PEPCID) IV 20 mg (02/05/20 1124)   PRN Meds:.sodium chloride, acetaminophen, albuterol, metoprolol tartrate, ondansetron (ZOFRAN) IV, polyethylene glycol   CULTURE RESULTS   Recent Results (from the past 240 hour(s))  Expectorated sputum assessment w rflx to resp cult     Status: None   Collection Time: 01/26/20 11:39 PM   Specimen: Expectorated Sputum  Result Value Ref Range Status   Specimen Description EXPECTORATED SPUTUM  Final   Special Requests NONE  Final   Sputum evaluation    Final    THIS SPECIMEN IS ACCEPTABLE FOR SPUTUM CULTURE Performed at [facility], [address] [place], [place] [address]    Report Status 01/27/2020 FINAL  Final  Culture, respiratory     Status: None   Collection Time: 01/26/20 11:39 PM  Result Value Ref Range Status   Specimen Description   Final    EXPECTORATED SPUTUM Performed at [facility], [address] [place], [place] [address]    Special Requests   Final    NONE Reflexed from [id number] Performed at [facility], [address] [place], [place] [address]    Gram Stain   Final    MODERATE SQUAMOUS EPITHELIAL CELLS PRESENT FEW WBC PRESENT, PREDOMINANTLY PMN RARE GRAM POSITIVE COCCI RARE GRAM NEGATIVE RODS    Culture   Final    NO GROWTH 2 DAYS Performed at [facility], [place] [address], [place],  [address]    Report Status 01/29/2020 FINAL  Final    LABS  CBC Latest Ref Rng & Units 02/05/2020 02/04/2020 02/03/2020  WBC 4.0 - 10.5 K/uL 11.1(H) 14.3(H) 15.6(H)  Hemoglobin 13.0 - 17.0 g/dL 14.4 15.3 15.8  Hematocrit 39 - 52 %  45.8 50.1 51.1  Platelets 150 - 400 K/uL 655(H) 807(H) 729(H)   BMP Latest Ref Rng & Units 02/05/2020 02/04/2020 02/03/2020  Glucose 70 - 99 mg/dL 306(H) 144(H) 124(H)  BUN 8 - 23 mg/dL _0 Creatinine 0.61 - 1.24 mg/dL 0.79 0.87 0.89  Sodium 135 - 145 mmol/L 137 138 140  Potassium 3.5 - 5.1 mmol/L 3.7 3.9 4.2  Chloride 98 - 111 mmol/L 104 106 109  CO2 22 - 32 mmol/L 22 22 21(L)  Calcium 8.9 - 10.3 mg/dL 8.2(L) 9.0 8.8(L)    Intake/Output Summary (Last 24 hours) at 02/05/2020 1250 Last data filed at 02/05/2020 1154 Gross per 24 hour  Intake 742.23 ml  Output 2500 ml  Net -1757.77 ml    IMAGING    DG Chest Port 1 View  Result Date: 02/05/2020 CLINICAL DATA:  Acute respiratory failure EXAM: PORTABLE CHEST 1 VIEW COMPARISON:  01/26/2020 FINDINGS: Cardiac shadow is enlarged. Postsurgical changes are again noted. Right-sided PICC line is  seen in satisfactory position. Lungs are well aerated bilaterally. Patchy airspace opacity is noted in the right base. Vascular congestion is again identified and stable. No bony abnormality is noted. IMPRESSION: Stable vascular congestion. Mild patchy opacity in the right base. Electronically Signed   By: Inez Catalina M.D.   On: 02/05/2020 03:29    SIGNIFICANT DIAGNOSTIC TESTS  01/21/20 - Echocardiogram: LVEF 20 to 25% with severely decreased function and severe global hypokinesis. Left ventricle moderately dilated with mild left ventricular hypertrophy.Grade 1 diastolic dysfunction and elevated left atrial pressure.Left atrium mildly dilated mild mitral valve regurgitation. 01/24/20 - Right/Left Heart Cath: right heart catheterization showed mildly elevated filling pressures, minimal pulmonary hypertension and normal cardiac output previously placed proximal RCA to mid RCA stent is widely patent.No significant coronary artery disease overall.   Nutrition Status: Nutrition Problem: Moderate Malnutrition Etiology: chronic illness (CHF) Signs/Symptoms: moderate fat depletion, moderate muscle depletion Interventions: Tube feeding, MVI       Central Line/ continued, requirement due to  Reason to continue [employer] Monitoring of central venous pressure or other hemodynamic parameters and poor IV access     ASSESSMENT AND PLAN  [name] with severe respiratory failure due to metabolic acidosis with severe systolic CHF exacerbation, previous history of COVID-19 in the setting of drug abuse, undergoing therapy for HCAP, complicated by tachyarrhythmia and progressive heart failure. Patient with recurrent respiratory failure requiring intubation. Remains extubated since 11/20.  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 Fletcher Anon, discontinue Eliquis, initiate IV heparin interim - R&LHC showed non-obstructive disease with patent stents, may need AICD for secondary prevention - Passed swallow study 11/23 - resume PO cardiac meds  - Cardiology following - appreciate input - Per cardiology will need LifeVest at discharge with f/u EP outpatient for AICD evaluation  SHOCK-CARDIOGENIC due to tachyarrhythmias - S/p electrical cardioversion - Use vasopressors as needed to keep MAP > 65 - IV amiodarone discontinued due to torsades --> reassessed by EP  11/16, no past evidence of torsades - Transitioned from IV lidocaine back to IV amiodarone per EP Dr. 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  NEUROLOGY - Acute metabolic encephalopathy, need for sedation - Continue safety sitter - Previously requiring precedex drip, haldol, diazepam PRN for agitation/anxiety - Start scheduled Zyprexa daily at bedtime  CARDIAC - ICU monitoring - Telemetry showing sinus rhythm with PVCs  ID  - Tracheal  culture 11/14 positive for MRSA and pseudomonas - Repeat culture 11/21 showed no growth - Antibiotic course completed 11/23  GI - GI PROPHYLAXIS as indicated - DIET --> Dysphagia 2 (fine chop); thin liquid - Constipation protocol as indicated  ENDO - Will use ICU hypoglycemic\Hyperglycemia protocol if indicated  ELECTROLYTES - Follow labs as needed - Replace as needed - Pharmacy consultation and following   DVT/GI PRX ordered and assessed TRANSFUSIONS AS NEEDED MONITOR FSBS I Assessed the need for Labs I Assessed the need for Foley I Assessed the need for Central Venous Line Family Discussion when available I Assessed the need for Mobilization I made an Assessment of medications to be adjusted accordingly Safety Risk assessment completed    Critical care provider statement:    Critical care time (minutes):  33   Critical care time was exclusive of:  Separately billable procedures and  treating other patients   Critical care was necessary to treat or prevent imminent or  life-threatening deterioration of the following conditions:  Encephalopathy, CHF exacerbation, Ventricular fibrillation   Critical care was time spent personally by me on the following  activities:  Development of treatment plan with patient or surrogate,  discussions with consultants, evaluation of patient's response to  treatment, examination of patient, obtaining history from patient or  surrogate, ordering and performing treatments and interventions, ordering  and review of laboratory studies and re-evaluation of patient's condition   I assumed direction of critical care for this patient from another  provider in my specialty: no     Ottie Glazier, M.D.  Pulmonary & [place]

## 2020-02-05 NOTE — Progress Notes (Signed)
Pt with severe delirium/agitation attempting to get out of bed and aggressive at times with 2 CNA's at bedside attempting to redirect pt, however unable to redirect.  All prn medications for extreme delirium/agitation discontinued during dayshift.  I discussed pts extreme delirium/agitation with ICU Intensivist Dr. Karna Christmas via telephone.  At this time will give IV ativan x1 dose, and will consult psychiatry for assistance with management of delirium.  Will continue to monitor and assess pt.  Sonda Rumble, AGNP  Pulmonary/Critical Care Pager [phone] (please enter 7 digits) PCCM Consult Pager [phone] (please enter 7 digits)

## 2020-02-05 NOTE — Progress Notes (Signed)
This RN noted tube feed leaking from the secondary lumen of the dobhoff tube. Attempted to flush dobhoff and noted that it was clogged. Attempted to unclog the tube to no avail. Notified NP of findings and received instructions to remove the dobhoff tube. Tube feeds stopped at this time. Will continue to monitor.  Carmel Sacramento, RN

## 2020-02-05 NOTE — Progress Notes (Signed)
Progress Note  Patient Name: Travis Maxwell Date of Encounter: 02/05/2020  Primary Cardiologist: Lorine Bears, MD   Subjective   Not able to obtain ROS today. Sedated.   On review of EMR, he was trying to get out of bed earlier this AM. Ativan doses given twice overnight. Safety mitts remain in lace. CXR obtained overnight after a congested cough was noted and showed stable vascular congestion and mild opacity of R base.  Inpatient Medications    Scheduled Meds: . allopurinol  300 mg Oral Daily  . apixaban  5 mg Oral BID  . aspirin  81 mg Oral Daily  . atorvastatin  40 mg Oral QHS  . carvedilol  6.25 mg Oral BID WC  . chlorhexidine  15 mL Mouth Rinse BID  . Chlorhexidine Gluconate Cloth  6 each Topical Daily  . docusate  100 mg Oral BID  . feeding supplement (PROSource TF)  45 mL Per Tube TID  . free water  30 mL Per Tube Q4H  . furosemide  20 mg Intravenous Daily  . insulin aspart  0-9 Units Subcutaneous Q4H  . mouth rinse  15 mL Mouth Rinse q12n4p  . melatonin  5 mg Oral QHS  . polyethylene glycol  17 g Oral Daily  . potassium chloride  20 mEq Oral Daily  . sacubitril-valsartan  1 tablet Oral BID  . senna-docusate  1 tablet Oral BID  . sodium chloride flush  10-40 mL Intracatheter Q12H  . sodium chloride flush  3 mL Intravenous Q12H  . spironolactone  12.5 mg Oral Daily  . thiamine injection  100 mg Intravenous Daily   Continuous Infusions: . sodium chloride Stopped (02/05/20 0628)  . amiodarone 30 mg/hr (02/05/20 7824)  . famotidine (PEPCID) IV Stopped (02/04/20 0902)  . feeding supplement (OSMOLITE 1.5 CAL) 1,000 mL (02/04/20 2254)  . magnesium sulfate bolus IVPB 4 g (02/05/20 0628)   PRN Meds: sodium chloride, acetaminophen, albuterol, metoprolol tartrate, ondansetron (ZOFRAN) IV, polyethylene glycol   Vital Signs    Vitals:   02/05/20 0200 02/05/20 0338 02/05/20 0400 02/05/20 0500  BP: 103/60  108/72 (!) 89/70  Pulse: 66  72 63  Resp: 18  18 17    Temp: 98.3 F (36.8 C)     TempSrc: Oral     SpO2: 98%  97% 99%  Weight:  66.7 kg    Height:        Intake/Output Summary (Last 24 hours) at 02/05/2020 0800 Last data filed at 02/05/2020 14/03/2019 Gross per 24 hour  Intake 716.96 ml  Output 2450 ml  Net -1733.04 ml   Last 3 Weights 02/05/2020 01/30/2020 01/29/2020  Weight (lbs) 147 lb 0.8 oz 158 lb 11.7 oz 158 lb 8.2 oz  Weight (kg) 66.7 kg 72 kg 71.9 kg      Telemetry    NSR, PVCs,70-80s - Personally Reviewed  ECG    No new tracings- Personally Reviewed  Physical Exam   GEN: No acute distress.  Sedated. Neck: No JVD Cardiac: RRR with intermittent extrasystole, 2/6 systolic murmur, rubs, or gallops.  Respiratory:  Coarse breath sounds bilaterally. Snoring on exam GI: Soft, nontender, non-distended  MS: No edema; No deformity. Neuro:  Nonfocal  Psych: Normal affect   Labs    High Sensitivity Troponin:   Recent Labs  Lab 01/09/20 0152 01/16/20 0245 01/16/20 0702 01/16/20 0838 01/19/20 0433  TROPONINIHS 89* 934* 711* 674* 234*      Chemistry Recent Labs  Lab 01/31/20 0409 01/31/20 1744  02/03/20 0551 02/04/20 0331 02/05/20 0349  NA 138   < > 140 138 137  K 3.5   < > 4.2 3.9 3.7  CL 104   < > 109 106 104  CO2 22   < > 21* 22 22  GLUCOSE 265*   < > 124* 144* 306*  BUN 11   < > 13 11 14   CREATININE 0.78   < > 0.89 0.87 0.79  CALCIUM 8.4*   < > 8.8* 9.0 8.2*  PROT 6.9  --   --   --   --   ALBUMIN 2.6*  --   --   --   --   AST 18  --   --   --   --   ALT 12  --   --   --   --   ALKPHOS 66  --   --   --   --   BILITOT 0.8  --   --   --   --   GFRNONAA >60   < > >60 >60 >60  ANIONGAP 12   < > 10 10 11    < > = values in this interval not displayed.     Hematology Recent Labs  Lab 02/03/20 0551 02/04/20 0331 02/05/20 0349  WBC 15.6* 14.3* 11.1*  RBC 7.29* 7.15* 6.59*  HGB 15.8 15.3 14.4  HCT 51.1 50.1 45.8  MCV 70.1* 70.1* 69.5*  MCH 21.7* 21.4* 21.9*  MCHC 30.9 30.5 31.4  RDW 28.6* 28.3* 28.0*   PLT 729* 807* 655*    BNP No results for input(s): BNP, PROBNP in the last 168 hours.   DDimer No results for input(s): DDIMER in the last 168 hours.   Radiology    DG Chest Port 1 View  Result Date: 02/05/2020 CLINICAL DATA:  Acute respiratory failure EXAM: PORTABLE CHEST 1 VIEW COMPARISON:  01/26/2020 FINDINGS: Cardiac shadow is enlarged. Postsurgical changes are again noted. Right-sided PICC line is seen in satisfactory position. Lungs are well aerated bilaterally. Patchy airspace opacity is noted in the right base. Vascular congestion is again identified and stable. No bony abnormality is noted. IMPRESSION: Stable vascular congestion. Mild patchy opacity in the right base. Electronically Signed   By: 14/03/2019 M.D.   On: 02/05/2020 03:29    Cardiac Studies   LHC 01/24/20:  Previously placed Prox RCA to Mid RCA stent (unknown type) is widely patent.  Prox RCA lesion is 50% stenosed.  1. Patent stent in a nondominant right coronary artery. No significant coronary artery disease overall. 2. The replaced aortic valve was not crossed. 3. Right heart catheterization showed mildly elevated filling pressures, minimal pulmonary hypertension and normal cardiac output.  Recommendations: The patient has no ischemic substrate for ventricular tachycardia. He has nonischemic cardiomyopathy. His hemodynamics appear good overall. Recommend continuing same dose of IV furosemide 20 mg daily which can be switched to an oral diuretic after extubation. I am going to stop lidocaine drip and continue amiodarone drip which can subsequently be switched to oral after extubation. I elected to add small dose carvedilol to assist with his cardiomyopathy and ventricular tachycardia.  Echo 01/21/20: 1. Left ventricular ejection fraction, by estimation, is 20 to 25%. The  left ventricle has severely decreased function. The left ventricle  demonstrates regional wall motion abnormalities (see  scoring  diagram/findings for description). The left  ventricular internal cavity size was moderately dilated. There is mild  left ventricular hypertrophy. Left ventricular diastolic parameters are  consistent with Grade I diastolic dysfunction (impaired relaxation).  Elevated left atrial pressure. There is  severe global hypokinesis with relative sparing of the basal segments.  2. Right ventricular systolic function is moderately reduced. The right  ventricular size is mildly enlarged. Tricuspid regurgitation signal is  inadequate for assessing PA pressure.  3. Left atrial size was mildly dilated.  4. The mitral valve is grossly normal. Mild mitral valve regurgitation.  No evidence of mitral stenosis.  5. The aortic valve was not well visualized. Aortic valve regurgitation  is not visualized. Echo findings are consistent with normal structure and  function of the aortic valve prosthesis.  6. Pulmonic valve regurgitation not well assessed.  Patient Profile     69 y.o. 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   Assessment & Plan    1. Sustained VT/tachyarrhythmias: Since admission, patient has had SVT and WCT.  He required early admission DCCV and  synchronized defibrillation x3.  IV amiodarone discontinued due to concern for QT prolongation and transition to lidocaine infusion.  EP subsequently evaluated the patient and recommended resumption of amiodarone.  Lidocaine was subsequently discontinued.  He underwent LHC with nonobstructive CAD.   Continue IV  amiodarone per EP.    Tentative plan to transition to oral amiodarone before discharge /  oral amiodarone 400 mg twice daily until 10 g load completion.  Maintain potassium goal 4.0, magnesium goal 2.0.    Once discharged, tentative plan for formal EP evaluation for AICD.    2. Acute on chronic systolic heart failure:  LVEF <36%. LHC without obstructive CAD.   Continue IV lasix 20mg  daily. Maintain slightly negative fluid balance and given current renal function stable.   Euvolemic today.  I/Os. Net -1,561.7cc with output -2,650.0cc yesterday.  Daily wt. 72.0kg  66.7kg.  Daily BMET. Stable. Cr 0.87  0.79 with BUN 22.  Continue GDMT as tolerated.   Spironolactone reduced yesterday. Continue reduced dose today due to soft BP on check this AM.  Continue Entresto 24/26mg  BID, reduced Spironolactone 12.5mg , Coreg 6.25mg  BID, Lopressor PRN as tolerated.   3. Coronary artery disease s/p non-STEMI s/p PCI/DES to mid RCA (2019)  High-sensitivity troponin minimally elevated and flat trending, thought 2/2 supply demand ischemia in the setting of his comorbid conditions including sustained VT, respiratory failure s/p COVID-19 infection and pna, and acute on chronic systolic heart failure as above.  LHC without obstructive CAD and patent stents.  Escalation of GDMT recommended as tolerated by BP/Cr.  Continue ASA, carvedilol, Lipitor.  4. Paroxysmal atrial fibrillation   Maintaining NSR.    Continue BB as tolerated by BP.  Continue anticoagulation as H&H tolerates with Eliquis 5mg  BID.   Continue amiodarone. Transition to oral before discharge.  5. Aortic stenosis s/p AVR  Previous echo as above. History of aortic insufficiency as above on most recent echo.  Continue conservative management.  8. Tobacco and marijuana use  Recommend complete cessation.  Nicotine patch as needed.  For questions or updates, please contact [facility] Please consult [email or web address] for  contact info under        Signed, 09-12-1998  Janett Labella, PA-C  02/05/2020, 8:00 AM

## 2020-02-05 NOTE — Progress Notes (Signed)
Notified by pts RN dobhoff now occluded with inability to flush despite multiple attempts.  Pt also noted to have audible rhonchi.  Therefore, instructed RN to remove dobhoff and stat CXR ordered due to concern of possible aspiration.  At this time pt intermittently agitated, therefore will not replace dobhoff due to HIGH RISK for aspiration.  Will continue to monitor and assess pt.  Sonda Rumble, AGNP  Pulmonary/Critical Care Pager [phone] (please enter 7 digits) PCCM Consult Pager [phone] (please enter 7 digits)

## 2020-02-05 NOTE — Progress Notes (Signed)
Patient continues to try to get out of bed and does not respond well to redirection or commands. Safety sitter is present in the room to prevent equipment removal and falls. Ativan doses given twice overnight which provided some relief and promoted rest. Safety mitts in place. Dobhoff removed due to occlusion. A congested cough was also noted after removal, so a CXR was obtained due to concerns for aspiration. Patient remains a high risk for aspiration due to confusion and agitation. Will continue to monitor.  Carmel Sacramento, RN

## 2020-02-05 NOTE — Progress Notes (Signed)
PROGRESS NOTE  Travis Maxwell YKZ:[id number] DOB: 09-22-1950 DOA: 01/06/2020 PCP: [facility]   LOS: 30 days   Brief narrative:  69 years old African-American male with past medical history of hypertension, diabetes mellitus type 2, hyperlipidemia, GERD, coronary artery disease status post PCI, history of aortic valve replacement on Eliquis presented to the hospital with respiratory distress, chest pain and shortness of breath on 01/06/2020.  He also had orthopnea and leg swelling.  Initially, patient was put on BiPAP but his breathing status worsened so he was emergently intubated and admitted to the hospital.  Patient was noted to be in severe cardiogenic shock on 01/07/2020.  Vasopressors were weaned off on 11/ 6/21 and patient was extubated to BiPAP at that time but patient was subsequently intubated on 01/19/2020.  Patient also had severe metabolic acidosis.  EKG showed some concerns for ST elevation but did not meet ST elevation MI criteria as per cardiology.  Patient received IV Cardizem and have a borderline elevated troponins.  BNP on admission was 1046.  Patient had a chest x-ray which showed vascular congestion.  Of note, patient has history of Covid pneumonia on 12/04/2019.  Patient remained extubated and was off pressors on 01/29/20 and was subsequently considered for transfer to medical service.  Patient has remained encephalopathic with a poor oral intake.  Patient was started on NG tube and tube feeding was started on 02/03/2020.  Has had episodes of agitation and confusion requiring sedation in 1-1 sitter.  Assessment/Plan:  Active Problems:   Acute on chronic systolic congestive heart failure (HCC)   Respiratory failure (HCC)   Malnutrition of moderate degree   Adult failure to thrive   Palliative care by specialist   DNR (do not resuscitate) discussion   Delirium   Pressure injury of skin   Ventricular tachycardia (HCC)  Poor oral intake with metabolic  encephalopathy Status post NG tube placement. on tube feeding.  Dietary on board.  Concern for hypoxic encephalopathy at this time..   Acute metabolic encephalopathy with delirium,  Likely multifactorial from hypoxic encephalopathy, history of substance abuse, ICU delirium, possible early dementia with sundowning.  Patient is more agitated during the evening.  Patient required Haldol, diazepam and Precedex drip during ICU.  On IV thiamine empirically due to poor oral intake.  Required IV Ativan twice yesterday for agitation with somnolence this morning.  Continue on one-to-one sitter.  Psychiatry has been consulted for management of delirium.  Due to concern for ventricular tachycardia antipsychotics have been minimized.  At this time, better choice is antipsychotic, we will try low-dose Zyprexa IM in the nighttime.  Follow-up psychiatry recommendations.  Spoke with critical care team.  Leukocytosis, thrombocytosis. Had MRSA and Pseudomonas pneumonia completed antibiotic 11/23.  No fever.  Will closely monitor.  WBC trending down to  11.1<14.3 <5.6 <18.4.  Acute hypoxic and hypercarbic  respiratory failure. Improved.  Status post intubation and subsequent extubation.   Patient had MRSA and Pseudomonas pneumonia,completed Zyvox and cefepime on 01/28/20.  Currently on room air.  Acute systolic congestive heart failure.  Nonischemic cardiomyopathy.  Last known ejection fraction of 20 to 25%.  Cardiology on board. Ptient underwent cardiac catheterization on 01/24/20 which showed nonobstructive disease with patent stents.  Patient  need AICD for secondary prevention, and LifeVest prior to discharge.  Currently on IV Lasix, beta-blockers, Entresto.  Further management as per cardiology.   Cardiogenic shock status post ventricular tachycardia Patient underwent electric cardioversion.    Currently on IV amiodarone  drip for ventricular tachycardia.  Was on heparin drip, on Eliquis currently.   Continue  aspirin, Lipitor, Coreg.  Goal is to keep potassium more than 4 and magnesium more than 2.  Paroxysmal atrial fibrillation.  Continue Coreg, amiodarone drip.  Was on heparin drip during hospitalization, now has been changed to Eliquis.  Acute kidney injury.   Improved.  Normal creatinine levels at this time.  MRSA and Pseudomonas pneumonia.  Tracheal culture 11/14 positive for MRSA and pseudomonas. Repeat culture 11/21 showed no growth. Antibiotic course completed 01/28/20  Dysphagia.  was on enteral feeding but tube has been dislodged.  Speech and swallow on board and recommend dysphagia one diet as able.  Will need new NG tube placement.  Mild hypomagnesemia.  We will continue to replenish.  Will receive 4 g of IV magnesium sulfate today.  Moderate protein calorie malnutrition dietary on board.   on tube feeds  Pressure injury 01/20/2020.  Sacral stage II ulceration.  Continue wound care.  Pressure Injury 01/20/20 Sacrum Posterior;Medial Stage 2 -  Partial thickness loss of dermis presenting as a shallow open injury with a red, pink wound bed without slough. (Active)  01/20/20 0800  Location: Sacrum  Location Orientation: Posterior;Medial  Staging: Stage 2 -  Partial thickness loss of dermis presenting as a shallow open injury with a red, pink wound bed without slough.  Wound Description (Comments):   Present on Admission: No   Ethics. patient has been seen by palliative care.  The decision was to proceed with current level of care.  Overall prognosis of the patient appears to be poor.    Today, I  spoke with the patient's daughter about lack of oral nutrition, encephalopathy.  She states that we have not been trying to feed the patient but I have explained to her that if patient is not mentally clear and is encephalopathic, he might choke and aspirate.  Patient's daughter further does not wish any tube feeding at all.  DVT prophylaxis: SCDs Start: 01/06/20 1332, Eliquis  Code  Status: Full code  Family Communication: I had a prolonged discussion  with the patient's daughter on the phone and updated her about the clinical condition of the patient.  Status is: Inpatient  Remains inpatient appropriate because:Unsafe d/c plan, IV treatments appropriate due to intensity of illness or inability to take PO and Inpatient level of care appropriate due to severity of illness, persistent  encephalopathy/need for skilled nursing facility placement, poor nutrition.   Dispo: The patient is from: Home              Anticipated d/c is to: Skilled nursing facility placement as per physical therapy recommendation.              Anticipated d/c date is: 3 days or more due to persistent encephalopathy, poor oral intake, arrhythmia              Patient currently is not medically stable to d/c.  Consultants:  PCCM  Cardiology  Palliative care  Procedures:  NG tube placement 11/28 Intubation and extubation. Cardiac catheterization 01/14/2020  01/21/20 -2D echocardiogram:LVEF 20 to 25% with severely decreased function and severe global hypokinesis. Left ventricle moderately dilated with mild left ventricular hypertrophy.Grade 1 diastolic dysfunction and elevated left atrial pressure.Left atrium mildly dilated mild mitral valve regurgitation.  01/24/20 - Right/Left Heart Cath:right heart catheterization showed mildly elevated filling pressures, minimal pulmonary hypertension and normal cardiac output previously placed proximal RCA to mid RCA stent is widely patent.No  significant coronary artery disease overall.  Antibiotics:  . Completed course  Subjective:  Today, patient was seen and examined at bedside.  Patient was extremely agitated yesterday and  required some sedation. Sitter at bedside.  Objective: Vitals:   02/05/20 0800 02/05/20 0832  BP: 109/68   Pulse: 64 (!) 55  Resp: (!) 22 (!) 23  Temp:  97.7 F (36.5 C)  SpO2: 97% 97%    Intake/Output Summary  (Last 24 hours) at 02/05/2020 0836 Last data filed at 02/05/2020 0800 Gross per 24 hour  Intake 742.23 ml  Output 2450 ml  Net -1707.77 ml   Filed Weights   01/29/20 0400 01/30/20 0326 02/05/20 0338  Weight: 71.9 kg 72 kg 66.7 kg   Body mass index is 23.03 kg/m.   Physical Exam: General: Patient appears deconditioned, debilitated, thinly built, off NG tube at this time.  Appears to be somnolent. HENT:   No scleral pallor or icterus noted. Oral mucosa is moist.  Chest: Coarse breath sounds noted  CVS: S1 &S2 heard. No murmur.  Regular rate and rhythm. Abdomen: Soft, nontender, nondistended.  Bowel sounds are heard.  Urinary catheter in place. Extremities: No cyanosis, clubbing or edema.  Peripheral pulses are palpable.  Right upper extremity PICC line in place.  Generalized weakness noted Psych: Somnolent, agitated at times CNS: Somnolent moving extremities answering few questions Skin: Warm and dry.  Sacral stage II pressure ulceration.   Data Review: I have personally reviewed the following laboratory data and studies,  CBC: Recent Labs  Lab 02/01/20 0250 02/02/20 0526 02/03/20 0551 02/04/20 0331 02/05/20 0349  WBC 18.3* 18.4* 15.6* 14.3* 11.1*  NEUTROABS 12.4* 12.7* 10.3* 8.0* 6.4  HGB 16.0 15.8 15.8 15.3 14.4  HCT 51.7 50.6 51.1 50.1 45.8  MCV 68.8* 69.0* 70.1* 70.1* 69.5*  PLT 893* 868* 729* 807* 655*   Basic Metabolic Panel: Recent Labs  Lab 02/01/20 0250 02/01/20 0250 02/01/20 1404 02/01/20 1404 02/02/20 0526 02/02/20 1616 02/03/20 0551 02/03/20 1852 02/04/20 0331 02/04/20 1600 02/05/20 0349  NA 140  --   --   --  139  --  140  --  138  --  137  K 3.8   < > 4.1  --  3.6  --  4.2  --  3.9  --  3.7  CL 109  --   --   --  107  --  109  --  106  --  104  CO2 20*  --   --   --  22  --  21*  --  22  --  22  GLUCOSE 109*  --   --   --  121*  --  124*  --  144*  --  306*  BUN 12  --   --   --  13  --  13  --  11  --  14  CREATININE 0.88  --   --   --  0.80   --  0.89  --  0.87  --  0.79  CALCIUM 9.2  --   --   --  9.0  --  8.8*  --  9.0  --  8.2*  MG 2.1   < > 1.8   < > 1.6*   < > 1.9 2.2 1.8 1.8 1.5*  PHOS 3.6  --   --   --  3.8  --  3.4  --  2.9  --  3.5   < > =  values in this interval not displayed.   Liver Function Tests: Recent Labs  Lab 01/31/20 0409  AST 18  ALT 12  ALKPHOS 66  BILITOT 0.8  PROT 6.9  ALBUMIN 2.6*   No results for input(s): LIPASE, AMYLASE in the last 168 hours. No results for input(s): AMMONIA in the last 168 hours. Cardiac Enzymes: No results for input(s): CKTOTAL, CKMB, CKMBINDEX, TROPONINI in the last 168 hours. BNP (last 3 results) Recent Labs    01/06/20 0821 01/13/20 0659 01/26/20 2339  BNP 1,046.8* 1,662.9* 1,886.4*    ProBNP (last 3 results) No results for input(s): PROBNP in the last 8760 hours.  CBG: Recent Labs  Lab 02/04/20 1548 02/04/20 1939 02/04/20 2313 02/05/20 0318 02/05/20 0725  GLUCAP 126* 172* 93 128* 136*   Recent Results (from the past 240 hour(s))  Expectorated sputum assessment w rflx to resp cult     Status: None   Collection Time: 01/26/20 11:39 PM   Specimen: Expectorated Sputum  Result Value Ref Range Status   Specimen Description EXPECTORATED SPUTUM  Final   Special Requests NONE  Final   Sputum evaluation   Final    THIS SPECIMEN IS ACCEPTABLE FOR SPUTUM CULTURE Performed at [facility], [address], [place], [place] [address]    Report Status 01/27/2020 FINAL  Final  Culture, respiratory     Status: None   Collection Time: 01/26/20 11:39 PM  Result Value Ref Range Status   Specimen Description   Final    EXPECTORATED SPUTUM Performed at [facility], [address] [place], [place] [address]    Special Requests   Final    NONE Reflexed from [id number] Performed at [facility], [address] [place], [place] [address]    Gram Stain   Final    MODERATE SQUAMOUS EPITHELIAL CELLS PRESENT FEW WBC PRESENT, PREDOMINANTLY  PMN RARE GRAM POSITIVE COCCI RARE GRAM NEGATIVE RODS    Culture   Final    NO GROWTH 2 DAYS Performed at [facility], [address], [place], [place] [address]    Report Status 01/29/2020 FINAL  Final     Studies: DG Chest Port 1 View  Result Date: 02/05/2020 CLINICAL DATA:  Acute respiratory failure EXAM: PORTABLE CHEST 1 VIEW COMPARISON:  01/26/2020 FINDINGS: Cardiac shadow is enlarged. Postsurgical changes are again noted. Right-sided PICC line is seen in satisfactory position. Lungs are well aerated bilaterally. Patchy airspace opacity is noted in the right base. Vascular congestion is again identified and stable. No bony abnormality is noted. IMPRESSION: Stable vascular congestion. Mild patchy opacity in the right base. Electronically Signed   By: Alcide Clever M.D.   On: 02/05/2020 03:29      Joycelyn Das, MD  [facility] 02/05/2020

## 2020-02-06 DIAGNOSIS — I5023 Acute on chronic systolic (congestive) heart failure: Secondary | ICD-10-CM | POA: Diagnosis not present

## 2020-02-06 DIAGNOSIS — I4891 Unspecified atrial fibrillation: Secondary | ICD-10-CM | POA: Diagnosis not present

## 2020-02-06 DIAGNOSIS — R410 Disorientation, unspecified: Secondary | ICD-10-CM | POA: Diagnosis not present

## 2020-02-06 DIAGNOSIS — J9601 Acute respiratory failure with hypoxia: Secondary | ICD-10-CM | POA: Diagnosis not present

## 2020-02-06 LAB — MAGNESIUM: Magnesium: 1.8 mg/dL (ref 1.7–2.4)

## 2020-02-06 LAB — CBC WITH DIFFERENTIAL/PLATELET
Abs Immature Granulocytes: 0.13 10*3/uL — ABNORMAL HIGH (ref 0.00–0.07)
Basophils Absolute: 0.3 10*3/uL — ABNORMAL HIGH (ref 0.0–0.1)
Basophils Relative: 2 %
Eosinophils Absolute: 1.3 10*3/uL — ABNORMAL HIGH (ref 0.0–0.5)
Eosinophils Relative: 11 %
HCT: 51 % (ref 39.0–52.0)
Hemoglobin: 15.3 g/dL (ref 13.0–17.0)
Immature Granulocytes: 1 %
Lymphocytes Relative: 21 %
Lymphs Abs: 2.6 10*3/uL (ref 0.7–4.0)
MCH: 21.4 pg — ABNORMAL LOW (ref 26.0–34.0)
MCHC: 30 g/dL (ref 30.0–36.0)
MCV: 71.2 fL — ABNORMAL LOW (ref 80.0–100.0)
Monocytes Absolute: 1.2 10*3/uL — ABNORMAL HIGH (ref 0.1–1.0)
Monocytes Relative: 10 %
Neutro Abs: 6.5 10*3/uL (ref 1.7–7.7)
Neutrophils Relative %: 55 %
Platelets: 619 10*3/uL — ABNORMAL HIGH (ref 150–400)
RBC: 7.16 MIL/uL — ABNORMAL HIGH (ref 4.22–5.81)
RDW: 28.1 % — ABNORMAL HIGH (ref 11.5–15.5)
Smear Review: NORMAL
WBC: 12 10*3/uL — ABNORMAL HIGH (ref 4.0–10.5)
nRBC: 0 % (ref 0.0–0.2)

## 2020-02-06 LAB — GLUCOSE, CAPILLARY
Glucose-Capillary: 132 mg/dL — ABNORMAL HIGH (ref 70–99)
Glucose-Capillary: 138 mg/dL — ABNORMAL HIGH (ref 70–99)
Glucose-Capillary: 109 mg/dL — ABNORMAL HIGH (ref 70–99)
Glucose-Capillary: 130 mg/dL — ABNORMAL HIGH (ref 70–99)
Glucose-Capillary: 110 mg/dL — ABNORMAL HIGH (ref 70–99)

## 2020-02-06 LAB — BASIC METABOLIC PANEL
Anion gap: 10 (ref 5–15)
BUN: 14 mg/dL (ref 8–23)
CO2: 23 mmol/L (ref 22–32)
Calcium: 9.1 mg/dL (ref 8.9–10.3)
Chloride: 104 mmol/L (ref 98–111)
Creatinine, Ser: 0.96 mg/dL (ref 0.61–1.24)
GFR, Estimated: >60 mL/min (ref >60)
Glucose, Bld: 121 mg/dL — ABNORMAL HIGH (ref 70–99)
Potassium: 4.1 mmol/L (ref 3.5–5.1)
Sodium: 137 mmol/L (ref 135–145)

## 2020-02-06 LAB — PHOSPHORUS: Phosphorus: 4.1 mg/dL (ref 2.5–4.6)

## 2020-02-06 MED ORDER — POTASSIUM CHLORIDE 20 MEQ PO PACK: 20.0000 meq | PACK | Freq: Every day | ORAL | Status: DC

## 2020-02-06 MED ORDER — ADULT MULTIVITAMIN W/MINERALS CH
1.0000 | ORAL_TABLET | Freq: Every day | ORAL | Status: DC
  Administered 2020-02-07 – 2020-02-20 (×13): 1 via ORAL
  Filled 2020-02-06 (×13): qty 1

## 2020-02-06 MED ORDER — MAGNESIUM SULFATE 2 GM/50ML IV SOLN
2.0000 g | Freq: Once | INTRAVENOUS | Status: AC
  Administered 2020-02-06: 2 g via INTRAVENOUS
  Filled 2020-02-06: qty 50

## 2020-02-06 MED ORDER — AMIODARONE HCL 200 MG PO TABS
400.0000 mg | ORAL_TABLET | Freq: Two times a day (BID) | ORAL | Status: DC
  Administered 2020-02-06 – 2020-02-10 (×7): 400 mg via ORAL
  Filled 2020-02-06 (×8): qty 2

## 2020-02-06 MED ORDER — ENSURE ENLIVE PO LIQD
237.0000 mL | Freq: Three times a day (TID) | ORAL | Status: DC
  Administered 2020-02-06 – 2020-02-20 (×31): 237 mL via ORAL

## 2020-02-06 MED ORDER — BISACODYL 10 MG RE SUPP
10.0000 mg | Freq: Every day | RECTAL | Status: DC | PRN
  Administered 2020-02-08: 10 mg via RECTAL
  Filled 2020-02-06: qty 1

## 2020-02-06 NOTE — Progress Notes (Signed)
Progress Note  Patient Name: Travis Maxwell Date of Encounter: 02/06/2020  Primary Cardiologist: Lorine Bears, MD   Subjective   Shakes his head no to CP and SOB.  Otherwise, unable to obtain ROS this AM.  Most recent EKG available is from 11/30 and in paper chart / folder.   Inpatient Medications    Scheduled Meds: . allopurinol  300 mg Oral Daily  . apixaban  5 mg Oral BID  . aspirin  81 mg Oral Daily  . atorvastatin  40 mg Oral QHS  . carvedilol  6.25 mg Oral BID WC  . chlorhexidine  15 mL Mouth Rinse BID  . Chlorhexidine Gluconate Cloth  6 each Topical Daily  . docusate  100 mg Oral BID  . feeding supplement (PROSource TF)  45 mL Per Tube TID  . furosemide  20 mg Intravenous Daily  . insulin aspart  0-9 Units Subcutaneous Q4H  . mouth rinse  15 mL Mouth Rinse q12n4p  . melatonin  5 mg Oral QHS  . OLANZapine  5 mg Intramuscular QPM  . polyethylene glycol  17 g Oral Daily  . potassium chloride  40 mEq Oral Daily  . sacubitril-valsartan  1 tablet Oral BID  . senna-docusate  1 tablet Oral BID  . sodium chloride flush  10-40 mL Intracatheter Q12H  . sodium chloride flush  3 mL Intravenous Q12H  . spironolactone  12.5 mg Oral Daily   Continuous Infusions: . sodium chloride Stopped (02/05/20 0628)  . amiodarone 30 mg/hr (02/05/20 2229)  . famotidine (PEPCID) IV 20 mg (02/05/20 1124)   PRN Meds: sodium chloride, acetaminophen, albuterol, LORazepam, metoprolol tartrate, ondansetron (ZOFRAN) IV, polyethylene glycol   Vital Signs    Vitals:   02/05/20 1400 02/05/20 1648 02/05/20 1957 02/06/20 0408  BP: (!) 109/50 124/80 104/75 (!) 85/56  Pulse: 69 94 73 60  Resp: (!) 25 20 19 20   Temp:  98.7 F (37.1 C) 98.1 F (36.7 C) (!) 97.4 F (36.3 C)  TempSrc:  Oral Oral Oral  SpO2:  90% 100% 97%  Weight:    75.8 kg  Height:        Intake/Output Summary (Last 24 hours) at 02/06/2020 0838 Last data filed at 02/06/2020 0300 Gross per 24 hour  Intake --  Output 1875  ml  Net -1875 ml   Last 3 Weights 02/06/2020 02/05/2020 01/30/2020  Weight (lbs) 167 lb 147 lb 0.8 oz 158 lb 11.7 oz  Weight (kg) 75.751 kg 66.7 kg 72 kg      Telemetry    NSR, PVCs, Bigeminy 60-80s - Personally Reviewed  ECG    Most recent EKG available is from 02/04/20 and in paper chart. NSR, LAE, RBBB, LVH with repolarization abnormality, QTc 611 - Personally Reviewed  Physical Exam   GEN: No acute distress.  Neck: No JVD Cardiac: RRR with intermittent extrasystole, 2/6 systolic murmur. No rubs, or gallops.  Respiratory:  Coarse breath sounds bilaterally.  GI: Soft, nontender, non-distended  MS: No edema; No deformity. Neuro:  Nonfocal  Psych: Normal affect   Labs    High Sensitivity Troponin:   Recent Labs  Lab 01/09/20 0152 01/16/20 0245 01/16/20 0702 01/16/20 0838 01/19/20 0433  TROPONINIHS 89* 934* 711* 674* 234*      Chemistry Recent Labs  Lab 01/31/20 0409 01/31/20 1744 02/03/20 0551 02/04/20 0331 02/05/20 0349  NA 138   < > 140 138 137  K 3.5   < > 4.2 3.9 3.7  CL 104   < >  109 106 104  CO2 22   < > 21* 22 22  GLUCOSE 265*   < > 124* 144* 306*  BUN 11   < > 13 11 14   CREATININE 0.78   < > 0.89 0.87 0.79  CALCIUM 8.4*   < > 8.8* 9.0 8.2*  PROT 6.9  --   --   --   --   ALBUMIN 2.6*  --   --   --   --   AST 18  --   --   --   --   ALT 12  --   --   --   --   ALKPHOS 66  --   --   --   --   BILITOT 0.8  --   --   --   --   GFRNONAA >60   < > >60 >60 >60  ANIONGAP 12   < > 10 10 11    < > = values in this interval not displayed.     Hematology Recent Labs  Lab 02/04/20 0331 02/05/20 0349 02/06/20 0406  WBC 14.3* 11.1* 12.0*  RBC 7.15* 6.59* 7.16*  HGB 15.3 14.4 15.3  HCT 50.1 45.8 51.0  MCV 70.1* 69.5* 71.2*  MCH 21.4* 21.9* 21.4*  MCHC 30.5 31.4 30.0  RDW 28.3* 28.0* 28.1*  PLT 807* 655* 619*    BNP No results for input(s): BNP, PROBNP in the last 168 hours.   DDimer No results for input(s): DDIMER in the last 168 hours.    Radiology    DG Chest Port 1 View  Result Date: 02/05/2020 CLINICAL DATA:  Acute respiratory failure EXAM: PORTABLE CHEST 1 VIEW COMPARISON:  01/26/2020 FINDINGS: Cardiac shadow is enlarged. Postsurgical changes are again noted. Right-sided PICC line is seen in satisfactory position. Lungs are well aerated bilaterally. Patchy airspace opacity is noted in the right base. Vascular congestion is again identified and stable. No bony abnormality is noted. IMPRESSION: Stable vascular congestion. Mild patchy opacity in the right base. Electronically Signed   By: 14/03/2019 M.D.   On: 02/05/2020 03:29    Cardiac Studies   LHC 01/24/20:  Previously placed Prox RCA to Mid RCA stent (unknown type) is widely patent.  Prox RCA lesion is 50% stenosed.  1. Patent stent in a nondominant right coronary artery. No significant coronary artery disease overall. 2. The replaced aortic valve was not crossed. 3. Right heart catheterization showed mildly elevated filling pressures, minimal pulmonary hypertension and normal cardiac output.  Recommendations: The patient has no ischemic substrate for ventricular tachycardia. He has nonischemic cardiomyopathy. His hemodynamics appear good overall. Recommend continuing same dose of IV furosemide 20 mg daily which can be switched to an oral diuretic after extubation. I am going to stop lidocaine drip and continue amiodarone drip which can subsequently be switched to oral after extubation. I elected to add small dose carvedilol to assist with his cardiomyopathy and ventricular tachycardia.  Echo 01/21/20: 1. Left ventricular ejection fraction, by estimation, is 20 to 25%. The  left ventricle has severely decreased function. The left ventricle  demonstrates regional wall motion abnormalities (see scoring  diagram/findings for description). The left  ventricular internal cavity size was moderately dilated. There is mild  left ventricular hypertrophy.  Left ventricular diastolic parameters are  consistent with Grade I diastolic dysfunction (impaired relaxation).  Elevated left atrial pressure. There is  severe global hypokinesis with relative sparing of the basal segments.  2. Right ventricular systolic function is  moderately reduced. The right  ventricular size is mildly enlarged. Tricuspid regurgitation signal is  inadequate for assessing PA pressure.  3. Left atrial size was mildly dilated.  4. The mitral valve is grossly normal. Mild mitral valve regurgitation.  No evidence of mitral stenosis.  5. The aortic valve was not well visualized. Aortic valve regurgitation  is not visualized. Echo findings are consistent with normal structure and  function of the aortic valve prosthesis.  6. Pulmonic valve regurgitation not well assessed.  Patient Profile     69 y.o. 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   Assessment & Plan    1. Sustained VT/tachyarrhythmias: Since admission, patient has had SVT and WCT.  He required early admission DCCV and  synchronized defibrillation x3.  IV amiodarone discontinued due to concern for QT prolongation and transition to lidocaine infusion.  EP subsequently evaluated the patient and recommended resumption of amiodarone.  Lidocaine was subsequently discontinued.  He underwent LHC with nonobstructive CAD.   Continue IV amiodarone per EP and as QT allows.    Tentative plan to transition to oral amiodarone before discharge /  oral amiodarone 400 mg twice daily until 10 g load  completion.  Maintain potassium goal 4.0, magnesium goal 2.0.    Once discharged, tentative plan for formal EP evaluation for AICD.    2. Prolonged QTc  Repeat EKG not available in EMR. Continue to monitor with EKGs.  Maintain QTc below 600.  Caution with QT prolonging medications.   Ensure electrolytes are at goal.  3. Acute on chronic systolic heart failure:  LVEF <09%. LHC this admission without obstructive CAD.   Continue IV lasix 20mg  daily. Maintain slightly negative fluid balance and given current renal function stable.   Euvolemic today.  I/Os. Net -1.9L.  Daily wt. 66.7kg  75.8kg.  Daily BMET. Still waiting for today's results.  Continue GDMT as tolerated.   Continue Spironolactone 12.5mg  daily, Entresto 24/26mg  BID, Coreg 6.25mg  BID, Lopressor PRN as tolerated.   3. Coronary artery disease s/p non-STEMI s/p PCI/DES to mid RCA (2109)  High-sensitivity troponin minimally elevated and flat trending, thought 2/2 supply demand ischemia in the setting of his comorbid conditions including sustained VT, respiratory failure s/p COVID-19 infection and pna, and acute on chronic systolic heart failure as above.  LHC this admission without obstructive CAD and patent stents.  Escalation of GDMT recommended as tolerated by BP/Cr.  Continue ASA, carvedilol, Lipitor.  4. Paroxysmal atrial fibrillation   Maintaining NSR.    Continue BB as tolerated by BP.  Continue anticoagulation as H&H tolerates with Eliquis 5mg  BID.   Continue amiodarone. Transition to oral before discharge.  5. Aortic stenosis s/p AVR  Previous echo as above. History of aortic insufficiency as above on most recent echo.  Continue conservative management.  8. Tobacco and marijuana use  Recommend complete cessation.  Nicotine patch as needed.  For questions or updates, please contact [facility] Please consult [email or web address] for contact info under        Signed, 04-10-1997,  PA-C  02/06/2020, 8:38 AM

## 2020-02-06 NOTE — Progress Notes (Signed)
Physical Therapy Treatment Patient Details Name: Travis Maxwell MRN: [id number] DOB: [birth year] Today's Date: 02/06/2020    History of Present Illness presented to ER secondary to CP, SOB; admitted for mangement of severe acute hypoxic and hypercapnic respiratory failure, acute toxic metabolic encephalopathy and cardiogenic shock.  Hospital course significacnt for intubation 11/1-11/6, 11/14-11/20, currently on RA; L/R heart cath (11/19) with no obstuctive lesions noted, EF approx 20%.  Recommended for lifevest at DC with further evaluation for AICD placement..    PT Comments    Ready for session.  Sitter in room and assists with care.  To EOB with rail and min guard/assist.  Steady in sitting with supervision but remains unsafe to be left unattended.  He is able to stand x 2 with HHA and march in place x 5 before fatigue. On second attempt he takes 2 sidesteps up along bed to reposition.  Once sitting generally fatigue and lays down needing assist from writer to protect him from hitting his head on side rail.  Lowers head down but feet remain on floor and needs max a to bring them back onto the bed.  Once side lying he initiates repositioning for comfort.  Appears and stated general fatigue.   Follow Up Recommendations  SNF     Equipment Recommendations       Recommendations for Other Services       Precautions / Restrictions Precautions Precautions: Fall    Mobility  Bed Mobility Overal bed mobility: Needs Assistance Bed Mobility: Supine to Sit;Sit to Supine     Supine to sit: Min guard Sit to supine: Min assist      Transfers Overall transfer level: Needs assistance Equipment used: 2 person hand held assist Transfers: Sit to/from Stand Sit to Stand: Min assist;+2 physical assistance            Ambulation/Gait             General Gait Details: unsafe/unable   Stairs             Wheelchair Mobility    Modified Rankin (Stroke Patients Only)        Balance Overall balance assessment: Needs assistance Sitting-balance support: No upper extremity supported;Feet supported Sitting balance-Leahy Scale: Fair     Standing balance support: Bilateral upper extremity supported Standing balance-Leahy Scale: Poor                              Cognition Arousal/Alertness: Lethargic Behavior During Therapy: WFL for tasks assessed/performed Overall Cognitive Status: Impaired/Different from baseline                                        Exercises Other Exercises Other Exercises: marchng in place x 5    General Comments        Pertinent Vitals/Pain Pain Assessment: No/denies pain    Home Living                      Prior Function            PT Goals (current goals can now be found in the care plan section) Progress towards PT goals: Progressing toward goals    Frequency           PT Plan Current plan remains appropriate    Co-evaluation  AM-PAC PT "6 Clicks" Mobility   Outcome Measure  Help needed turning from your back to your side while in a flat bed without using bedrails?: A Little Help needed moving from lying on your back to sitting on the side of a flat bed without using bedrails?: A Little Help needed moving to and from a bed to a chair (including a wheelchair)?: A Lot Help needed standing up from a chair using your arms (e.g., wheelchair or bedside chair)?: A Lot Help needed to walk in hospital room?: A Lot Help needed climbing 3-5 steps with a railing? : Total 6 Click Score: 13    End of Session Equipment Utilized During Treatment: Gait belt Activity Tolerance: Patient limited by fatigue Patient left: in bed;with bed alarm set;with call bell/phone within reach;with nursing/sitter in room Nurse Communication: Mobility status       Time: 5809-9833 PT Time Calculation (min) (ACUTE ONLY): 9 min  Charges:  $Therapeutic Activity: 8-22  mins                    Danielle Dess, PTA 02/06/20, 9:56 AM

## 2020-02-06 NOTE — Progress Notes (Signed)
CRITICAL CARE NOTE  [name] presenting to the ED on 01/06/2020 in respiratory distress, complaining of chest pain and shortness of breath. He also reported orthopnea and increased lower extremity swelling. History was limited due to acuity of condition and respiratory status. Patient was placed on biPAP, but progressively worsened and was emergently intubated. Patient is a current 0.5ppd smoker and uses marijuana. He has a severe metabolic acidosis. Patient has a past medical history significant for HTN, T2DM, HLD, GERD, CAD (s/p PCI), AVR with bioprosthetic valve, systolic CHF with EF of 70%, and paroxysmal A. Fib, currently anticoagulated on Eliquis. EKG upon arrival to ED revealed sinus rhythm at a rate of 99bpm with RBBB and PVCs with ST elevation in II, III, avF, V4-V6. Cardiologist on call stated that STEMI criteria were not met and advised to manage CHF. Patient received IV diltiazem and reported improvement of symptoms. Labs were notable for high sensitivity troponin 71->91->89. BNP 1046, creatinine 1.26, BUN 21, potassium 5.4. CXR showed improved pulmonary edema since prior study with chronic pulmonary venous hypertension. Notably, patient was admitted to [facility] ED 12/04/2019 for acute respiratory failure and acute on chronic systolic CHF in the setting of COVID pneumonia.   SIGNIFICANT EVENTS  11/02 - severe cardiogenic shock, remains on vent 11/03 - severe respiratory failure, severe systolic CHF 26/37 - patient remains critically ill 11/05 - patient remains critically ill 11/06 - off pressors, extubated to biPAP 11/07 - remains extubated, febrile, MSSA+ tracheal culture - started ancef 11/08 - remains extubated, on 2L by nasal cannula. Afebrile today with white count of 21.3K, up from 15.7K yesterday. Still encephalopathic and drowsy. On and off Precedex. Went into narrow complex SVT,then progressed to wide complex tachycardiawas shocked and given a loading dose of amiodarone. Will  remain on amiodarone gtt.Cardiology at bedside.  11/09 - remains extubated, on 2L by nasal cannula. Afebrile today with white count of 19.2K, down since yesterday. BP soft overnight, was started on Neo-synephrine infusion. More alert than yesterday, but only oriented to self. Failed Yale 3oz swallow study early this morning, started coughing at the end of the study. Will need to remain NPO until cleared by SLP. Patient has been educated on this, yet continues to incessantly ask for water.  11/10 - patient clinically improved. He is alert communicative and in no distress. He ate entire meal without issues. Still has confusion intermittently during interview. Frequent PVCs on telemetry.  Will initiate OT/PT today for downgrade planning. 11/11 - patient minimally symptomatic, he again had episodes of Torsades today s/p 120Jx1 mag 2g.  Poor prognosis overall with advanced CHF.  11/12 - Dicussed case with cardiology. Antiarrythmic agents being modified. Will also call consult for Electrophysiology  11/13 - Patient with no arrythmia overnight. He remains on precedex and neosynephrine.  I spoke with cardiology Dr Rayann Heman regarding EP evaluation- he recommended to continue with current medical therapy and have cardiologist call to EP on Monday to review options.  He has 1:1 sitter and is resting in bed comfortably. Spoke with Velva Harman POA daughter to update on poor prognosis worsening clinical condition.  11/14 - further deterioration overnight.  Patient required reintubation and MV.  Updated next of kin daughter Velva Harman this am and sent message to cardiology to discuss poor prognosis with family.    11/15 - remains intubated and sedated, high risk for cardiac arrest and death February 18, 2023 - remains intubated and sedated, frequent PVCs overnight, 2g magnesium given, awaiting EP consult, patient becomes tachypneic and diaphoretic during  SBTs 11/17 - increased ectopy, seen by EP who advised weaning lidocaine and restarting  amiodarone, planning for right & left heart cath on Friday 11/19 with Dr. Fletcher Anon, discontinue Eliquis, start IV heparin interim, tracheal culture 11/14 positive for MRSA and pseudomonas, started cefepime and linezolid 11/18 - remains intubated and sedated, on continuous infusion of amiodarone and lidocaine, metoprolol discontinued by cardiology due to low blood pressures, right/left heart cath scheduled for tomorrow 11/19 - pt s/p L&RHC no obstructive lesions, RHC with normal pressures. Patient will be treated medically with transition to PO amio and plan for SBT with attempt to liberate.  11/20 - Patient was liberated from ventilator.  He had persistent VT post extubation, was fortunate to have cardiologist present on time and had immediate medical intervention with improvement. Still very poor prognosis long term. Spoke to granddaugther at bedside requested family meeting to re-address goals of care due to limited therapy available for patient.  11/21 - Patient was able to stay off ventilator last night. He was started on Precedex overnight and is currently sedated to RASS-1 on nasal canula, plan to wean down/off sedation.  11/22 - Patient sitting up in bed, no obvious distress on BiPAP, no acute events overnight, attempt transition to nasal cannula, on NTG drip for BP control per cardiology until PO meds can be taken 11/23 - Patient required Haldol and Valium overnight for agitation, on 2L by [place] today, passed bedside swallow study this morning, can transition to PO meds, Foley removed 11/24 - Patient remains extubated and off pressors, breathing comfortably on room air, antibiotics completed yesterday, PT/OT today, fluctuating alertness, transitioned from IV amiodarone and heparin to PO amiodarone and Eliquis  11/29- patient is encephalopathic.  Working on swallow eval and PO meds to transition out of MICU 02/04/20- patient confused with 1:1 sitter.    No episodes of arrythmia.  02/05/20- patient  continues to have episodes of sundowning in evenings.  Ive discussed this with attending Dr Louanne Belton, we have psychiatry on consult request.   02/06/20 patient with confusion this am. Per RN he had aggressive behavior but he was calm and cooperative during evaluation this am. He has sundowning from dementia in evening times.  CC  Follow up respiratory failure  SUBJECTIVE Chronic ill appearing Prognosis is guarded Confused, speech following Cardiology following   BP 108/64 (BP Location: Left Arm)   Pulse 60   Temp 97.6 F (36.4 C) (Oral)   Resp 16   Ht 5' 7.01" (1.702 m)   Wt 75.8 kg   SpO2 97%   BMI 26.15 kg/m    I/O last 3 completed shifts: In: 742.2 [I.V.:617.2; NG/GT:75; IV Piggyback:50] Out: 2275 [Urine:2275] No intake/output data recorded.  SpO2: 97 % O2 Flow Rate (L/min): 2 L/min FiO2 (%): 28 %  Estimated body mass index is 26.15 kg/m as calculated from the following:   Height as of this encounter: 5' 7.01" (1.702 m).   Weight as of this encounter: 75.8 kg.   REVIEW OF SYSTEMS  ROS limited due toconfusion  Pressure Injury 01/20/20 Sacrum Posterior;Medial Stage 2 -  Partial thickness loss of dermis presenting as a shallow open injury with a red, pink wound bed without slough. (Active)  01/20/20 0800  Location: Sacrum  Location Orientation: Posterior;Medial  Staging: Stage 2 -  Partial thickness loss of dermis presenting as a shallow open injury with a red, pink wound bed without slough.  Wound Description (Comments):   Present on Admission: No    PHYSICAL EXAMINATION:  GENERAL: Chronically ill appearing, NAD, conversational, difficult to understand HEAD: Normocephalic, atraumatic.  EYES: Pupils equal, round, reactive to light.  No scleral icterus.  MOUTH: Moist mucosal membrane. NECK: Supple. No JVD. PULMONARY: Mildly tachypneic, unlabored, coarse breath sounds bilaterally CARDIOVASCULAR: S1 and S2. Regular rate and rhythm. No murmurs, rubs, or  gallops.  GASTROINTESTINAL: Soft, nontender, non-distended.  Positive bowel sounds.   MUSCULOSKELETAL: No swelling, clubbing, or edema.  NEUROLOGIC: A&O x 1, follows simple commands, confused SKIN: Intact, warm, dry  MEDICATIONS: I have reviewed all medications and confirmed regimen as documented Scheduled Meds: . allopurinol  300 mg Oral Daily  . apixaban  5 mg Oral BID  . aspirin  81 mg Oral Daily  . atorvastatin  40 mg Oral QHS  . carvedilol  6.25 mg Oral BID WC  . chlorhexidine  15 mL Mouth Rinse BID  . Chlorhexidine Gluconate Cloth  6 each Topical Daily  . docusate  100 mg Oral BID  . feeding supplement (PROSource TF)  45 mL Per Tube TID  . furosemide  20 mg Intravenous Daily  . insulin aspart  0-9 Units Subcutaneous Q4H  . mouth rinse  15 mL Mouth Rinse q12n4p  . melatonin  5 mg Oral QHS  . OLANZapine  5 mg Intramuscular QPM  . polyethylene glycol  17 g Oral Daily  . potassium chloride  40 mEq Oral Daily  . sacubitril-valsartan  1 tablet Oral BID  . senna-docusate  1 tablet Oral BID  . sodium chloride flush  10-40 mL Intracatheter Q12H  . sodium chloride flush  3 mL Intravenous Q12H  . spironolactone  12.5 mg Oral Daily   Continuous Infusions: . sodium chloride Stopped (02/05/20 0628)  . amiodarone 30 mg/hr (02/06/20 0925)  . famotidine (PEPCID) IV 20 mg (02/06/20 0854)   PRN Meds:.sodium chloride, acetaminophen, albuterol, LORazepam, metoprolol tartrate, ondansetron (ZOFRAN) IV, polyethylene glycol   CULTURE RESULTS   No results found for this or any previous visit (from the past 240 hour(s)).  LABS  CBC Latest Ref Rng & Units 02/06/2020 02/05/2020 02/04/2020  WBC 4.0 - 10.5 K/uL 12.0(H) 11.1(H) 14.3(H)  Hemoglobin 13.0 - 17.0 g/dL 15.3 14.4 15.3  Hematocrit 39 - 52 % 51.0 45.8 50.1  Platelets 150 - 400 K/uL 619(H) 655(H) 807(H)   BMP Latest Ref Rng & Units 02/06/2020 02/05/2020 02/04/2020  Glucose 70 - 99 mg/dL 121(H) 306(H) 144(H)  BUN 8 - 23 mg/dL $Remove'14 14 11   'elartTK$ Creatinine 0.61 - 1.24 mg/dL 0.96 0.79 0.87  Sodium 135 - 145 mmol/L 137 137 138  Potassium 3.5 - 5.1 mmol/L 4.1 3.7 3.9  Chloride 98 - 111 mmol/L 104 104 106  CO2 22 - 32 mmol/L $RemoveB'23 22 22  'QpMKmrFY$ Calcium 8.9 - 10.3 mg/dL 9.1 8.2(L) 9.0    Intake/Output Summary (Last 24 hours) at 02/06/2020 0931 Last data filed at 02/06/2020 0300 Gross per 24 hour  Intake --  Output 1875 ml  Net -1875 ml    IMAGING    No results found.  SIGNIFICANT DIAGNOSTIC TESTS  01/21/20 - Echocardiogram: LVEF 20 to 25% with severely decreased function and severe global hypokinesis. Left ventricle moderately dilated with mild left ventricular hypertrophy.Grade 1 diastolic dysfunction and elevated left atrial pressure.Left atrium mildly dilated mild mitral valve regurgitation. 01/24/20 - Right/Left Heart Cath: right heart catheterization showed mildly elevated filling pressures, minimal pulmonary hypertension and normal cardiac output previously placed proximal RCA to mid RCA stent is widely patent.No significant coronary artery disease overall.  Nutrition Status: Nutrition Problem: Moderate Malnutrition Etiology: chronic illness (CHF) Signs/Symptoms: moderate fat depletion, moderate muscle depletion Interventions: Tube feeding, MVI       Central Line/ continued, requirement due to  Reason to continue [employer] Monitoring of central venous pressure or other hemodynamic parameters and poor IV access     ASSESSMENT AND PLAN  [name] with severe respiratory failure due to metabolic acidosis with severe systolic CHF exacerbation, previous history of COVID-19 in the setting of drug abuse, undergoing therapy for HCAP, complicated by tachyarrhythmia and progressive heart failure. Patient with recurrent respiratory failure requiring intubation. Remains extubated since 11/20.  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 Fletcher Anon, discontinue Eliquis, initiate IV heparin interim - R&LHC showed non-obstructive disease with patent stents, may need AICD for secondary prevention - Passed swallow study 11/23 - resume PO cardiac meds  - Cardiology following - appreciate input - Per cardiology will need LifeVest at discharge with f/u EP outpatient for AICD evaluation  SHOCK-CARDIOGENIC due to tachyarrhythmias - S/p electrical cardioversion - Use vasopressors as needed to keep MAP > 65 - IV amiodarone discontinued due to torsades --> reassessed by EP 11/16, no past evidence of torsades - Transitioned from IV lidocaine back to IV amiodarone per EP Dr. 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  NEUROLOGY - Acute metabolic encephalopathy -Dementia with sundowining - Continue safety sitter - Previously requiring precedex drip, haldol, diazepam PRN for agitation/anxiety - Start scheduled Zyprexa daily at  bedtime  CARDIAC - ICU monitoring - Telemetry showing sinus rhythm with PVCs  ID  - Tracheal culture 11/14 positive for MRSA and pseudomonas - Repeat culture 11/21 showed no growth - Antibiotic course completed 11/23  GI - GI PROPHYLAXIS as indicated - DIET --> Dysphagia 2 (fine chop); thin liquid - Constipation protocol as indicated  ENDO - Will use ICU hypoglycemic\Hyperglycemia protocol if indicated  ELECTROLYTES - Follow labs as needed - Replace as needed - Pharmacy consultation and following   DVT/GI PRX ordered and assessed TRANSFUSIONS AS NEEDED MONITOR FSBS I Assessed the need for Labs I Assessed the need for Foley I Assessed the need for Central Venous Line Family Discussion when available I Assessed the need for Mobilization I made an Assessment of medications to be adjusted accordingly Safety Risk assessment completed       Ottie Glazier, M.D.  [facility]

## 2020-02-06 NOTE — Progress Notes (Signed)
PHARMACY CONSULT NOTE  Pharmacy Consult for Electrolyte Monitoring and Replacement   Recent Labs: Potassium (mmol/L)  Date Value  02/06/2020 4.1   Magnesium (mg/dL)  Date Value  08/81/1031 1.8   Calcium (mg/dL)  Date Value  59/45/8592 9.1   Albumin (g/dL)  Date Value  92/44/6286 2.6 (L)   Phosphorus (mg/dL)  Date Value  38/17/7116 4.1   Sodium (mmol/L)  Date Value  02/06/2020 137   Assessment: 69 year old male transferred to ICU for acute toxic metabolic encephalopathy. Overnight 11/10, patient with two runs pulseless VT, with second episode requiring CPR and defibrillation. Converted back into baseline bundle branch rhythm with rate in 50's-60's.  Similar episodes 11/11 requiring defibrillation. Pharmacy to manage electrolytes.  Nasogastric tube removed 12/1 secondary to clogging; family did not wish to have tube replaced. Pt seen by SLP and placed on a dysphagia 1/thin liquid diet. Patient oral intake increasing.   Diuretics: IV Lasix 20 mg daily + Aldactone 12.5 mg daily Other medications: entresto 24/26 mg BID   Goal of Therapy:  Potassium 4.0 - 5.1 mmol/L Magnesium 2.0 - 2.4 mg/dL All other electrolytes within normal limits  Plan:   Mg 1.8, will order IV magnesium sulfate 2 g x 1 and recheck with AM labs  K 3.7 > 4.1, will decrease oral KCL from 40 mEq to 20 mEq daily starting tomorrow due to increasing K+. Patient started on spironolactone 12.5 mg daily on 11/30   Phos 4.1, will decrease monitoring frequency from daily to every 3 days  Follow-up electrolytes with AM labs  Sharen Hones, PharmD, BCPS 02/06/2020 1:20 PM

## 2020-02-06 NOTE — Progress Notes (Signed)
Nutrition Follow-up  DOCUMENTATION CODES:   Non-severe (moderate) malnutrition in context of chronic illness  INTERVENTION:   Ensure Enlive po TID, each supplement provides 350 kcal and 20 grams of protein  MVI daily  NUTRITION DIAGNOSIS:   Moderate Malnutrition related to chronic illness (CHF) as evidenced by moderate fat depletion, moderate muscle depletion. Ongoing.  GOAL:   Patient will meet greater than or equal to 90% of their needs -progressing   MONITOR:   PO intake, Supplement acceptance, Labs, Weight trends, Skin, I & O's  ASSESSMENT:   69 year old male with PMHx of HTN, DM, gout, hx AVR with bioprosthetic valve, GERD, CAD, CHF (EF 20%), recent COVID-19 11/30/2019 admitted with respiratory failure, severe metabolic acidosis, CHF exacerbation.   Nasogastric tube removed 12/1 secondary to clogging; family did not wish to have tube replaced. Pt seen by SLP and placed on a dysphagia 1/thin liquid diet. Spoke with RN this morning who reports that pt ate 75% of his breakfast which included OJ, pineapple, cream of wheat, pancake, sausage and whole milk. RD will add supplements and MVI to help pt meet his estimated needs. Per chart, pt has not had a bowel movement in five days; pt is on bowel regimen. Per chart, pt appears fairly weight stable since admit.   Medications reviewed and include: allopurinol, aspirin, colace, lasix, insulin, melatonin, miralax, KCl, senokot, aldactone, pepcid  Labs reviewed: K 4.1 wnl, P 4.1 wnl, Mg 1.8 wnl Wbc- 12.0(H) cbgs- 132, 138 x 24 hrs AIC 6.4(H)- 9/25  Diet Order:   Diet Order            DIET - DYS 1 Room service appropriate? Yes with Assist; Fluid consistency: Thin  Diet effective now                EDUCATION NEEDS:   No education needs have been identified at this time  Skin:  Skin Assessment: Skin Integrity Issues: Skin Integrity Issues:: Stage II Stage II: sacrum (2.5cm x 1cm)  Last BM:  11/26  Height:   Ht  Readings from Last 1 Encounters:  01/24/20 5' 7.01" (1.702 m)   Weight:   Wt Readings from Last 1 Encounters:  02/06/20 75.8 kg   Ideal Body Weight:  67.3 kg  BMI:  Body mass index is 26.15 kg/m.  Estimated Nutritional Needs:   Kcal:  2000-2200  Protein:  110-120 grams  Fluid:  1.5-1.8 L/day or per MD  Betsey Holiday MS, RD, LDN Please refer to [facility] for RD and/or RD on-call/weekend/after hours pager

## 2020-02-06 NOTE — Progress Notes (Addendum)
PROGRESS NOTE  Travis Maxwell WTU:[id number] DOB: 22-Dec-1950 DOA: 01/06/2020 PCP: [facility]   LOS: 31 days   Brief narrative:  69 years old African-American male with past medical history of hypertension, diabetes mellitus type 2, hyperlipidemia, GERD, coronary artery disease status post PCI, history of aortic valve replacement on Eliquis presented to the hospital with respiratory distress, chest pain and shortness of breath on 01/06/2020.  He also had orthopnea and leg swelling.  Initially, patient was put on BiPAP but his breathing status worsened so he was emergently intubated and admitted to the hospital.  Patient was noted to be in severe cardiogenic shock on 01/07/2020.  Vasopressors were weaned off on 11/ 6/21 and patient was extubated to BiPAP at that time but patient was subsequently intubated on 01/19/2020.  Patient also had severe metabolic acidosis.  EKG showed some concerns for ST elevation but did not meet ST elevation MI criteria as per cardiology.  Patient received IV Cardizem and have a borderline elevated troponins.  BNP on admission was 1046.  Patient had a chest x-ray which showed vascular congestion.  Of note, patient has history of Covid pneumonia on 12/04/2019.  Patient remained extubated and was off pressors on 01/29/20 and was subsequently considered for transfer to medical service.  Patient has remained encephalopathic with a poor oral intake.  Patient was started on NG tube and tube feeding was started on 02/03/2020.  Has had episodes of agitation and confusion requiring sedation in 1-1 sitter.  Assessment/Plan:  Principal Problem:   Delirium Active Problems:   Acute on chronic systolic congestive heart failure (HCC)   Respiratory failure (HCC)   Malnutrition of moderate degree   Adult failure to thrive   Palliative care by specialist   DNR (do not resuscitate) discussion   Pressure injury of skin   Ventricular tachycardia (HCC)    Acute  metabolic encephalopathy with delirium,  Has been persistent and difficult to control.  Likely multifactorial from hypoxic encephalopathy, history of substance abuse, ICU delirium, possible early dementia with sundowning.  Patient is more agitated during the evening and calmer in the morning time..  Patient required Haldol, diazepam and Precedex drip during ICU.  Received IV thiamine as well.    Continue on one-to-one sitter.  Psychiatry has seen the patient at this time.  On IM Zyprexa low-dose at bedtime.  As needed Ativan as per psychiatry.    Leukocytosis, thrombocytosis. Had MRSA and Pseudomonas pneumonia completed antibiotic 11/23.  No fever.  Will closely monitor.  WBC trending down to  11.1<14.3 <5.6 <18.4.  Acute hypoxic and hypercarbic  respiratory failure. Improved.  Status post intubation and subsequent extubation.   Patient had MRSA and Pseudomonas pneumonia,completed Zyvox and cefepime on 01/28/20.  Currently on room air.  Acute systolic congestive heart failure.  Nonischemic cardiomyopathy.  Last known ejection fraction of 20 to 25%.  Cardiology on board. Ptient underwent cardiac catheterization on 01/24/20 which showed nonobstructive disease.  Patient  need AICD for secondary prevention, and LifeVest prior to discharge.  Currently on IV Lasix, beta-blockers, Entresto.  Further management as per cardiology.   Cardiogenic shock status post ventricular tachycardia Patient underwent electrocardioversion.    Currently on IV amiodarone drip for ventricular tachycardia due to erratic oral intake.  Continue aspirin, Eliquis Lipitor, Coreg.  Goal is to keep potassium more than 4 and magnesium more than 2.  Paroxysmal atrial fibrillation.  Continue Coreg, amiodarone drip.  Continue Eliquis.  Acute kidney injury.   Improved.  MRSA and Pseudomonas pneumonia.  Tracheal culture 11/14 positive for MRSA and pseudomonas. Repeat culture 11/21 showed no growth. Antibiotic course completed  01/28/20  Mild hypomagnesemia.  Continue to replenish.  Received 4 g of magnesium sulfate yesterday.  Moderate protein calorie malnutrition, Poor oral intake with metabolic encephalopathy, dysphagia Attempted NG tube feeding but NG tube is off at this time.Marland Kitchen  Spoke with the patient's daughter yesterday.  She does not wish any tube feeding. She wishes her father to be able to eat by himself.  I have explained his encephalopathy and the risk of aspiration and the attempts on feeding..On dysphagia 1 diet.  Speech and swallow on board  Pressure injury 01/20/2020.  Sacral stage II ulceration.  Continue wound care.  Pressure Injury 01/20/20 Sacrum Posterior;Medial Stage 2 -  Partial thickness loss of dermis presenting as a shallow open injury with a red, pink wound bed without slough. (Active)  01/20/20 0800  Location: Sacrum  Location Orientation: Posterior;Medial  Staging: Stage 2 -  Partial thickness loss of dermis presenting as a shallow open injury with a red, pink wound bed without slough.  Wound Description (Comments):   Present on Admission: No   Ethics. Palliative care had discussion with family,plan is to proceed with current level of care.  Overall prognosis of the patient appears to be poor.     DVT prophylaxis: SCDs Start: 01/06/20 1332, Eliquis  Code Status: Full code  Family Communication: None today. I had a prolonged discussion  with the patient's daughter on the phone yesterday. Status is: Inpatient  Remains inpatient appropriate because:Unsafe d/c plan, IV treatments appropriate due to intensity of illness or inability to take PO and Inpatient level of care appropriate due to severity of illness, persistent  encephalopathy/need for skilled nursing facility placement, poor nutrition, IV antiarrhythmics   Dispo: The patient is from: Home              Anticipated d/c is to: Skilled nursing facility placement as per physical therapy recommendation.              Anticipated  d/c date is: 3 days or more due to persistent encephalopathy, poor oral intake, arrhythmia on IV antiarrhythmics              Patient currently is not medically stable to d/c.  Consultants:  PCCM  Cardiology  Palliative care  Procedures:  NG tube placement 11/28 Intubation and extubation. Cardiac catheterization 01/14/2020  01/21/20 -2D echocardiogram:LVEF 20 to 25% with severely decreased function and severe global hypokinesis. Left ventricle moderately dilated with mild left ventricular hypertrophy.Grade 1 diastolic dysfunction and elevated left atrial pressure.Left atrium mildly dilated mild mitral valve regurgitation.  01/24/20 - Right/Left Heart Cath:right heart catheterization showed mildly elevated filling pressures, minimal pulmonary hypertension and normal cardiac output previously placed proximal RCA to mid RCA stent is widely patent.No significant coronary artery disease overall.  Antibiotics:  . Completed course  Subjective:  Today, patient was seen and examined at bedside.  Patient appears to be more alert awake this morning.  Following few commands.  Denies pain.  Poor historian.  Secondary school teacher at bedside.  Objective: Vitals:   02/06/20 0906 02/06/20 1237  BP: 108/64 (!) 107/58  Pulse: 60 62  Resp: 16 16  Temp: 97.6 F (36.4 C)   SpO2: 97% 100%    Intake/Output Summary (Last 24 hours) at 02/06/2020 1514 Last data filed at 02/06/2020 0300 Gross per 24 hour  Intake --  Output 625 ml  Net -  625 ml   Filed Weights   01/30/20 0326 02/05/20 0338 02/06/20 0408  Weight: 72 kg 66.7 kg 75.8 kg   Body mass index is 26.15 kg/m.   Physical Exam: General: Patient appears deconditioned, debilitated, thinly built, alert awake at the time of my exam  follows few commands, appears confused HENT:   No scleral pallor or icterus noted. Oral mucosa is moist.  Chest: Coarse breath sounds noted , no wheezes CVS: S1 &S2 heard. No murmur.  Irregular  rhythm Abdomen: Soft, nontender, nondistended.  Bowel sounds are heard.  Urinary catheter in place. Extremities: No cyanosis, clubbing or edema.  Peripheral pulses are palpable.  Right upper extremity PICC line in place.  Generalized weakness noted Psych: Alert awake, sitter at bedside.   CNS: Following commands, alert awake appears confused. Skin: Warm and dry.  Sacral stage II pressure ulceration.   Data Review: I have personally reviewed the following laboratory data and studies,  CBC: Recent Labs  Lab 02/02/20 0526 02/03/20 0551 02/04/20 0331 02/05/20 0349 02/06/20 0406  WBC 18.4* 15.6* 14.3* 11.1* 12.0*  NEUTROABS 12.7* 10.3* 8.0* 6.4 6.5  HGB 15.8 15.8 15.3 14.4 15.3  HCT 50.6 51.1 50.1 45.8 51.0  MCV 69.0* 70.1* 70.1* 69.5* 71.2*  PLT 868* 729* 807* 655* 619*   Basic Metabolic Panel: Recent Labs  Lab 02/02/20 0526 02/02/20 1616 02/03/20 0551 02/03/20 1852 02/04/20 0331 02/04/20 1600 02/05/20 0349 02/05/20 1550 02/06/20 0406  NA 139  --  140  --  138  --  137  --  137  K 3.6  --  4.2  --  3.9  --  3.7  --  4.1  CL 107  --  109  --  106  --  104  --  104  CO2 22  --  21*  --  22  --  22  --  23  GLUCOSE 121*  --  124*  --  144*  --  306*  --  121*  BUN 13  --  13  --  11  --  14  --  14  CREATININE 0.80  --  0.89  --  0.87  --  0.79  --  0.96  CALCIUM 9.0  --  8.8*  --  9.0  --  8.2*  --  9.1  MG 1.6*   < > 1.9   < > 1.8 1.8 1.5* 2.2 1.8  PHOS 3.8  --  3.4  --  2.9  --  3.5  --  4.1   < > = values in this interval not displayed.   Liver Function Tests: Recent Labs  Lab 01/31/20 0409  AST 18  ALT 12  ALKPHOS 66  BILITOT 0.8  PROT 6.9  ALBUMIN 2.6*   No results for input(s): LIPASE, AMYLASE in the last 168 hours. No results for input(s): AMMONIA in the last 168 hours. Cardiac Enzymes: No results for input(s): CKTOTAL, CKMB, CKMBINDEX, TROPONINI in the last 168 hours. BNP (last 3 results) Recent Labs    01/06/20 0821 01/13/20 0659 01/26/20 2339   BNP 1,046.8* 1,662.9* 1,886.4*    ProBNP (last 3 results) No results for input(s): PROBNP in the last 8760 hours.  CBG: Recent Labs  Lab 02/05/20 2036 02/05/20 2335 02/06/20 0407 02/06/20 0902 02/06/20 1130  GLUCAP 128* 117* 132* 138* 109*   No results found for this or any previous visit (from the past 240 hour(s)).   Studies: DG [facility] [id number]  Result Date: 02/05/2020 CLINICAL DATA:  Acute respiratory failure EXAM: PORTABLE CHEST 1 VIEW COMPARISON:  01/26/2020 FINDINGS: Cardiac shadow is enlarged. Postsurgical changes are again noted. Right-sided PICC line is seen in satisfactory position. Lungs are well aerated bilaterally. Patchy airspace opacity is noted in the right base. Vascular congestion is again identified and stable. No bony abnormality is noted. IMPRESSION: Stable vascular congestion. Mild patchy opacity in the right base. Electronically Signed   By: Alcide Clever M.D.   On: 02/05/2020 03:29      Joycelyn Das, MD  [facility] 02/06/2020

## 2020-02-06 NOTE — Progress Notes (Signed)
Occupational Therapy Treatment Patient Details Name: Travis Maxwell MRN: [id number] DOB: 1950/09/12 Today's Date: 02/06/2020    History of present illness presented to ER secondary to CP, SOB; admitted for mangement of severe acute hypoxic and hypercapnic respiratory failure, acute toxic metabolic encephalopathy and cardiogenic shock.  Hospital course significacnt for intubation 11/1-11/6, 11/14-11/20, currently on RA; L/R heart cath (11/19) with no obstuctive lesions noted, EF approx 20%.  Recommended for lifevest at DC with further evaluation for AICD placement..   OT comments  Pt seen for OT tx this date to f/u re: safety with ADLs/ADL mobility. OT facilitates pt participation in trials at [facility] dressing in sitting EOB with pt unable to follow commands and ultaimtely requiring MAX A. Pt requires MIN A for sit to stand with 1 UE supported by HHA. Pt requires 2p assist for line/lead mgt for safety d/t decreased awareness. Requires MAX verbal/tactile cues for safety awareness/sequencing taking steps. Will continue to follow. Still anticipate SNF is most appropriate d/c recommendation given pt's current fxl status versus baseline.    Follow Up Recommendations  SNF    Equipment Recommendations  Other (comment) (defer to next level of care)    Recommendations for Other Services      Precautions / Restrictions Precautions Precautions: Fall Restrictions Weight Bearing Restrictions: No       Mobility Bed Mobility Overal bed mobility: Needs Assistance Bed Mobility: Supine to Sit;Sit to Supine     Supine to sit: Min guard Sit to supine: Min assist   General bed mobility comments: requires cues/extended times  Transfers Overall transfer level: Needs assistance Equipment used: 2 person hand held assist Transfers: Sit to/from Stand Sit to Stand: Min assist;+2 safety/equipment         General transfer comment: MIN A for acutal assist, but pt with no awareness of lines and leads,  requires second person to watch lines for safety.    Balance Overall balance assessment: Needs assistance Sitting-balance support: Feet supported;Bilateral upper extremity supported Sitting balance-Leahy Scale: Fair Sitting balance - Comments: requires UE support to sustain static sit   Standing balance support: Single extremity supported Standing balance-Leahy Scale: Poor Standing balance comment: requires at least 1 UE support to sustain static stand and MAX verbal/tactile cues for safety awareness.                           ADL either performed or assessed with clinical judgement   ADL Overall ADL's : Needs assistance/impaired                     Lower Body Dressing: Maximal assistance;Sitting/lateral leans Lower Body Dressing Details (indicate cue type and reason): to don socks seated EOB, MAX cues for sequencing steps                     Vision   Additional Comments: diffciult to assess d/t cognition   Perception     Praxis      Cognition Arousal/Alertness: Lethargic Behavior During Therapy: WFL for tasks assessed/performed Overall Cognitive Status: Impaired/Different from baseline Area of Impairment: Orientation;Attention;Memory;Following commands;Safety/judgement;Awareness;Problem solving                 Orientation Level: Disoriented to;Time;Situation;Place;Person (minimal verbalizations during session this date) Current Attention Level: Sustained Memory: Decreased short-term memory Following Commands: Follows one step commands inconsistently;Follows one step commands with increased time Safety/Judgement: Decreased awareness of safety;Decreased awareness of deficits Awareness: Emergent Problem Solving: Slow  processing;Decreased initiation;Difficulty sequencing;Requires verbal cues;Requires tactile cues General Comments: Pt with minimal verbalizations throughout session, able to follow simple one step commands with extensive  processing time and tactile cues.        Exercises Other Exercises Other Exercises: OT facilitates pt participation in trials at LB dressing in sitting EOB with pt unable to follow commands and ultaimtely requiring MAX A. Pt requires MIN A for sit to stand with 1 UE supported by HHA. Pt requires 2p assist for line/lead mgt for safety d/t decreased awareness. Requires MAX verbal/tactile cues for safety awareness/sequencing taking steps.   Shoulder Instructions       General Comments      Pertinent Vitals/ Pain       Pain Assessment: Faces Faces Pain Scale: No hurt  Home Living                                          Prior Functioning/Environment              Frequency  Min 1X/week        Progress Toward Goals  OT Goals(current goals can now be found in the care plan section)  Progress towards OT goals: Progressing toward goals  Acute Rehab OT Goals Patient Stated Goal: none stated OT Goal Formulation: Patient unable to participate in goal setting Time For Goal Achievement: 02/12/20 Potential to Achieve Goals: Good  Plan Discharge plan remains appropriate    Co-evaluation                 AM-PAC OT "6 Clicks" Daily Activity     Outcome Measure   Help from another person eating meals?: A Little Help from another person taking care of personal grooming?: A Little Help from another person toileting, which includes using toliet, bedpan, or urinal?: A Lot Help from another person bathing (including washing, rinsing, drying)?: A Lot Help from another person to put on and taking off regular upper body clothing?: A Lot Help from another person to put on and taking off regular lower body clothing?: Total 6 Click Score: 13    End of Session    OT Visit Diagnosis: Unsteadiness on feet (R26.81);Muscle weakness (generalized) (M62.81)   Activity Tolerance Patient tolerated treatment well   Patient Left in bed;with call bell/phone within  reach;with bed alarm set;with nursing/sitter in room (sitter present throughout)   Nurse Communication Mobility status        Time: 1550-1616 OT Time Calculation (min): 26 min  Charges: OT General Charges $OT Visit: 1 Visit OT Treatments $Self Care/Home Management : 8-22 mins $Therapeutic Activity: 8-22 mins  Rejeana Brock, MS, OTR/L ascom [phone] 02/06/20, 5:23 PM

## 2020-02-07 DIAGNOSIS — R410 Disorientation, unspecified: Secondary | ICD-10-CM | POA: Diagnosis not present

## 2020-02-07 DIAGNOSIS — I4891 Unspecified atrial fibrillation: Secondary | ICD-10-CM | POA: Diagnosis not present

## 2020-02-07 DIAGNOSIS — I5023 Acute on chronic systolic (congestive) heart failure: Secondary | ICD-10-CM | POA: Diagnosis not present

## 2020-02-07 DIAGNOSIS — J9601 Acute respiratory failure with hypoxia: Secondary | ICD-10-CM | POA: Diagnosis not present

## 2020-02-07 LAB — CBC WITH DIFFERENTIAL/PLATELET
Abs Immature Granulocytes: 0.12 10*3/uL — ABNORMAL HIGH (ref 0.00–0.07)
Basophils Absolute: 0.4 10*3/uL — ABNORMAL HIGH (ref 0.0–0.1)
Basophils Relative: 3 %
Eosinophils Absolute: 1.3 10*3/uL — ABNORMAL HIGH (ref 0.0–0.5)
Eosinophils Relative: 12 %
HCT: 54.6 % — ABNORMAL HIGH (ref 39.0–52.0)
Hemoglobin: 17.1 g/dL — ABNORMAL HIGH (ref 13.0–17.0)
Immature Granulocytes: 1 %
Lymphocytes Relative: 20 %
Lymphs Abs: 2.2 10*3/uL (ref 0.7–4.0)
MCH: 22.2 pg — ABNORMAL LOW (ref 26.0–34.0)
MCHC: 31.3 g/dL (ref 30.0–36.0)
MCV: 70.9 fL — ABNORMAL LOW (ref 80.0–100.0)
Monocytes Absolute: 1.4 10*3/uL — ABNORMAL HIGH (ref 0.1–1.0)
Monocytes Relative: 13 %
Neutro Abs: 5.5 10*3/uL (ref 1.7–7.7)
Neutrophils Relative %: 51 %
Platelets: 693 10*3/uL — ABNORMAL HIGH (ref 150–400)
RBC: 7.7 MIL/uL — ABNORMAL HIGH (ref 4.22–5.81)
RDW: 28.6 % — ABNORMAL HIGH (ref 11.5–15.5)
Smear Review: NORMAL
WBC: 10.9 10*3/uL — ABNORMAL HIGH (ref 4.0–10.5)
nRBC: 0 % (ref 0.0–0.2)

## 2020-02-07 LAB — GLUCOSE, CAPILLARY
Glucose-Capillary: 109 mg/dL — ABNORMAL HIGH (ref 70–99)
Glucose-Capillary: 116 mg/dL — ABNORMAL HIGH (ref 70–99)
Glucose-Capillary: 133 mg/dL — ABNORMAL HIGH (ref 70–99)
Glucose-Capillary: 142 mg/dL — ABNORMAL HIGH (ref 70–99)
Glucose-Capillary: 146 mg/dL — ABNORMAL HIGH (ref 70–99)
Glucose-Capillary: 182 mg/dL — ABNORMAL HIGH (ref 70–99)
Glucose-Capillary: 200 mg/dL — ABNORMAL HIGH (ref 70–99)

## 2020-02-07 LAB — BASIC METABOLIC PANEL
Anion gap: 12 (ref 5–15)
BUN: 17 mg/dL (ref 8–23)
CO2: 22 mmol/L (ref 22–32)
Calcium: 9.6 mg/dL (ref 8.9–10.3)
Chloride: 104 mmol/L (ref 98–111)
Creatinine, Ser: 0.94 mg/dL (ref 0.61–1.24)
GFR, Estimated: >60 mL/min (ref >60)
Glucose, Bld: 93 mg/dL (ref 70–99)
Potassium: 4.5 mmol/L (ref 3.5–5.1)
Sodium: 138 mmol/L (ref 135–145)

## 2020-02-07 LAB — MAGNESIUM: Magnesium: 1.9 mg/dL (ref 1.7–2.4)

## 2020-02-07 MED ORDER — MAGNESIUM SULFATE 2 GM/50ML IV SOLN
2.0000 g | Freq: Once | INTRAVENOUS | Status: AC
  Administered 2020-02-07: 2 g via INTRAVENOUS
  Filled 2020-02-07: qty 50

## 2020-02-07 MED ORDER — OLANZAPINE 10 MG IM SOLR
10.0000 mg | Freq: Once | INTRAMUSCULAR | Status: AC
  Administered 2020-02-07: 10 mg via INTRAMUSCULAR
  Filled 2020-02-07: qty 10

## 2020-02-07 MED ORDER — POTASSIUM CHLORIDE 20 MEQ PO PACK
20.0000 meq | PACK | Freq: Every day | ORAL | Status: DC
  Administered 2020-02-08 – 2020-02-20 (×11): 20 meq via ORAL
  Filled 2020-02-07 (×12): qty 1

## 2020-02-07 MED ORDER — OLANZAPINE 10 MG IM SOLR
10.0000 mg | Freq: Three times a day (TID) | INTRAMUSCULAR | Status: DC
  Administered 2020-02-07 – 2020-02-10 (×6): 10 mg via INTRAMUSCULAR
  Filled 2020-02-07 (×14): qty 10

## 2020-02-07 MED ORDER — FAMOTIDINE 20 MG PO TABS
20.0000 mg | ORAL_TABLET | Freq: Every day | ORAL | Status: DC
  Administered 2020-02-07 – 2020-02-20 (×13): 20 mg via ORAL
  Filled 2020-02-07 (×13): qty 1

## 2020-02-07 MED ORDER — SODIUM CHLORIDE 0.45 % IV SOLN: INTRAVENOUS | Status: AC

## 2020-02-07 MED ORDER — FUROSEMIDE 20 MG PO TABS
20.0000 mg | ORAL_TABLET | Freq: Every day | ORAL | Status: DC
  Administered 2020-02-08 – 2020-02-20 (×12): 20 mg via ORAL
  Filled 2020-02-07 (×12): qty 1

## 2020-02-07 NOTE — Progress Notes (Signed)
PROGRESS NOTE    Travis Maxwell  PJK:[id number]  DOB: [birth year]  DOA: 01/06/2020 PCP: [facility]:   Hospital course:  69 year old African-American male with past medical history of hypertension, diabetes mellitus type 2, hyperlipidemia, GERD, coronary artery disease status post PCI, history of aortic valve replacement on Eliquis presented to the hospital with respiratory distress, chest pain and shortness of breath on 01/06/2020.  He also had orthopnea and leg swelling.  Initially, patient was put on BiPAP but his breathing status worsened so he was emergently intubated and admitted to the hospital.  Patient was noted to be in severe cardiogenic shock on 01/07/2020.  Vasopressors were weaned off on 11/ 6/21 and patient was extubated to BiPAP at that time but patient was subsequently intubated on 01/19/2020.  Patient also had severe metabolic acidosis.  EKG showed some concerns for ST elevation but did not meet ST elevation MI criteria as per cardiology.  Patient received IV Cardizem and have a borderline elevated troponins.  BNP on admission was 1046.  Patient had a chest x-ray which showed vascular congestion.  Of note, patient has history of Covid pneumonia on 12/04/2019.  Patient remained extubated and was off pressors on 01/29/20 and was subsequently considered for transfer to medical service.  Patient has remained encephalopathic with a poor oral intake.  Patient was started on NG tube and tube feeding was started on 02/03/2020.  Has had episodes of agitation and confusion requiring sedation in 1-1 sitter.  Subjective:  Patient is sleeping.  Able to be aroused with gentle shaking but remains very groggy.  Denies any complaints.  Denies any pain.  Patient had previously been aggressive with the nurse and had recently gotten some Haldol.   Objective: Vitals:   02/07/20 0843 02/07/20 1053 02/07/20 1200 02/07/20 1556  BP: 103/71 97/64 100/70  109/78  Pulse: 77   72  Resp: 18  (!) 22 16  Temp: 97.7 F (36.5 C)  98 F (36.7 C) 98.4 F (36.9 C)  TempSrc: Oral     SpO2: 100%  98% 99%  Weight:      Height:        Intake/Output Summary (Last 24 hours) at 02/07/2020 1912 Last data filed at 02/07/2020 1500 Gross per 24 hour  Intake 1028 ml  Output 850 ml  Net 178 ml   Filed Weights   01/30/20 0326 02/05/20 0338 02/06/20 0408  Weight: 72 kg 66.7 kg 75.8 kg     Exam:  General: Chronically ill-appearing man looking much older than stated age lying flat in bed in no distress sleeping comfortably. Eyes: sclera anicteric, conjuctiva mild injection bilaterally CVS: S1-S2, regular  Respiratory:  decreased air entry bilaterally secondary to decreased inspiratory effort, rales at bases  GI: NABS, soft, NT  LE: No edema.  Neuro: Very somnolent but arousable with gentle shaking, speaks coherently and appropriately in 1-2 word answers.   Assessment & Plan:   Encephalopathy and delirium likely secondary to hypoxic encephalopathy and possible dementia Agitation has been a significant issue with the patient being aggressive at time Pre she had psychiatric input earlier today and increased doses of Zyprexa Will follow EKG and QTC Continue one-on-one sitter  HFrEF Nonischemic cardiomyopathy, EF of 20 to 25% Cardiac catheterization 01/24/2020 with nonobstructive disease Continue diuresis, beta-blockers and Entresto  S/P episode of VT subsequent cardiogenic shock Status post cardioversion Presently on oral amiodarone Daryl Eastern is to keep potassium greater than 4 and magnesium greater than  2.  PAF On Coreg and amiodarone Continue Eliquis  S/P Pneumonia Patient is status post treatment for Pseudomonas and MRSA pneumonia per tracheal culture. Completed antibiotics 01/28/2020  Moderate protein calorie malnutrition Due to decreased oral intake Per patient's daughter, no tube feedings are warranted, she would like to have her  father eat by himself. On dysphagia 1 diet  Stage II sacral decub Continue wound care   DVT prophylaxis: On Eliquis Code Status: Full Family Communication: SNF Disposition Plan:   Patient is from: Home  Anticipated Discharge Location: SNF  Barriers to Discharge: Placement, persistent encephalopathy with aggression and agitation  Is patient medically stable for Discharge: No   Consultants:  PCCM  Cardiology  Palliative care  Procedures:  Status post intubation and extubation  Cardiac catheterization 01/14/2020  NGT placement 02/02/2020    Data Reviewed:  Basic Metabolic Panel: Recent Labs  Lab 02/02/20 0526 02/02/20 1616 02/03/20 0551 02/03/20 1852 02/04/20 0331 02/04/20 0331 02/04/20 1600 02/05/20 0349 02/05/20 1550 02/06/20 0406 02/07/20 0550  NA 139  --  140  --  138  --   --  137  --  137 138  K 3.6  --  4.2  --  3.9  --   --  3.7  --  4.1 4.5  CL 107  --  109  --  106  --   --  104  --  104 104  CO2 22  --  21*  --  22  --   --  22  --  23 22  GLUCOSE 121*  --  124*  --  144*  --   --  306*  --  121* 93  BUN 13  --  13  --  11  --   --  14  --  14 17  CREATININE 0.80  --  0.89  --  0.87  --   --  0.79  --  0.96 0.94  CALCIUM 9.0  --  8.8*  --  9.0  --   --  8.2*  --  9.1 9.6  MG 1.6*   < > 1.9   < > 1.8   < > 1.8 1.5* 2.2 1.8 1.9  PHOS 3.8  --  3.4  --  2.9  --   --  3.5  --  4.1  --    < > = values in this interval not displayed.   Liver Function Tests: No results for input(s): AST, ALT, ALKPHOS, BILITOT, PROT, ALBUMIN in the last 168 hours. No results for input(s): LIPASE, AMYLASE in the last 168 hours. No results for input(s): AMMONIA in the last 168 hours. CBC: Recent Labs  Lab 02/03/20 0551 02/04/20 0331 02/05/20 0349 02/06/20 0406 02/07/20 0550  WBC 15.6* 14.3* 11.1* 12.0* 10.9*  NEUTROABS 10.3* 8.0* 6.4 6.5 5.5  HGB 15.8 15.3 14.4 15.3 17.1*  HCT 51.1 50.1 45.8 51.0 54.6*  MCV 70.1* 70.1* 69.5* 71.2* 70.9*  PLT 729* 807* 655*  619* 693*   Cardiac Enzymes: No results for input(s): CKTOTAL, CKMB, CKMBINDEX, TROPONINI in the last 168 hours. BNP (last 3 results) No results for input(s): PROBNP in the last 8760 hours. CBG: Recent Labs  Lab 02/07/20 0026 02/07/20 0405 02/07/20 0841 02/07/20 1246 02/07/20 1557  GLUCAP 182* 133* 200* 116* 142*    No results found for this or any previous visit (from the past 240 hour(s)).    Studies: No results found.   Scheduled Meds: . allopurinol  300  mg Oral Daily  . amiodarone  400 mg Oral BID  . apixaban  5 mg Oral BID  . atorvastatin  40 mg Oral QHS  . carvedilol  6.25 mg Oral BID WC  . chlorhexidine  15 mL Mouth Rinse BID  . Chlorhexidine Gluconate Cloth  6 each Topical Daily  . docusate  100 mg Oral BID  . famotidine  20 mg Oral Daily  . feeding supplement  237 mL Oral TID BM  . [START ON 02/08/2020] furosemide  20 mg Oral Daily  . insulin aspart  0-9 Units Subcutaneous Q4H  . mouth rinse  15 mL Mouth Rinse q12n4p  . melatonin  5 mg Oral QHS  . multivitamin with minerals  1 tablet Oral Daily  . OLANZapine  10 mg Intramuscular Q8H  . polyethylene glycol  17 g Oral Daily  . [START ON 02/08/2020] potassium chloride  20 mEq Oral Daily  . sacubitril-valsartan  1 tablet Oral BID  . senna-docusate  1 tablet Oral BID  . sodium chloride flush  10-40 mL Intracatheter Q12H  . sodium chloride flush  3 mL Intravenous Q12H  . spironolactone  12.5 mg Oral Daily   Continuous Infusions: . sodium chloride Stopped (02/07/20 1442)  . sodium chloride Stopped (02/05/20 1043)    Principal Problem:   Delirium Active Problems:   Acute on chronic systolic congestive heart failure (HCC)   Respiratory failure (HCC)   Malnutrition of moderate degree   Adult failure to thrive   Palliative care by specialist   DNR (do not resuscitate) discussion   Pressure injury of skin   Ventricular tachycardia (HCC)     Kayla Deshaies Orma Flaming, [facility]  If 7PM-7AM,  please contact night-coverage [email or web address] Password [email or web address] 02/07/2020, 7:12 PM    LOS: 32 days

## 2020-02-07 NOTE — Progress Notes (Signed)
PHARMACY CONSULT NOTE  Pharmacy Consult for Electrolyte Monitoring and Replacement   Recent Labs: Potassium (mmol/L)  Date Value  02/07/2020 4.5   Magnesium (mg/dL)  Date Value  75/17/0017 1.9   Calcium (mg/dL)  Date Value  49/44/9675 9.6   Albumin (g/dL)  Date Value  91/63/8466 2.6 (L)   Phosphorus (mg/dL)  Date Value  59/93/5701 4.1   Sodium (mmol/L)  Date Value  02/07/2020 138   Assessment: 69 year old male transferred to ICU for acute toxic metabolic encephalopathy. Overnight 11/10, patient with two runs pulseless VT, with second episode requiring CPR and defibrillation. Converted back into baseline bundle branch rhythm with rate in 50's-60's.  Similar episodes 11/11 requiring defibrillation. Pharmacy to manage electrolytes.  Nasogastric tube removed 12/1 secondary to clogging; family did not wish to have tube replaced. Pt seen by SLP and placed on a dysphagia 1/thin liquid diet. Patient oral intake increasing.   Diuretics: IV Lasix 20 mg daily + Aldactone 12.5 mg daily Other medications: entresto 24/26 mg BID   Goal of Therapy:  Potassium 4.0 - 5.1 mmol/L Magnesium 2.0 - 2.4 mg/dL All other electrolytes within normal limits  Plan:   Mg 1.9, will order IV magnesium sulfate 2 g x 1 and recheck in 2 days with Phos  K 3.7 > 4.1 > 4.5 pt received 40 mEq KCL oral yesterday. Will hold KCL replacement today and begin 20 mEq daily starting tomorrow due to increasing K+. Patient started on spironolactone 12.5 mg daily on 11/30   Phos 4.1, will decrease monitoring frequency from daily to every 3 days  Follow-up electrolytes with AM labs  Sharen Hones, PharmD, BCPS 02/07/2020 8:17 AM

## 2020-02-07 NOTE — Consult Note (Signed)
[facility] Face-to-Face Psychiatry Consult   Reason for Consult: Follow-up consult.  Patient seen 2 days ago.  New consult placed today for same problem.  Patient with delirium Referring Physician:  Luberta Robertson Patient Identification: Kahlen Heyer MRN:  [id number] Principal Diagnosis: Delirium Diagnosis:  Principal Problem:   Delirium Active Problems:   Acute on chronic systolic congestive heart failure (HCC)   Respiratory failure (HCC)   Malnutrition of moderate degree   Adult failure to thrive   Palliative care by specialist   DNR (do not resuscitate) discussion   Pressure injury of skin   Ventricular tachycardia (HCC)   Total Time spent with patient: 30 minutes  Subjective:   Herron Lippi is a 69 y.o. male patient admitted with patient not able to articulate.  HPI: Patient seen chart reviewed.  69 year old man with multiple medical problems who has been transferred out of the ICU onto a regular medical bed but continues to be agitated and delirious.  Almost constantly trying to crawl out of bed.  On evaluation today the patient did not respond to any verbal requests or questions.  Would not respond to suggestions to stop trying to crawl out of bed.  Had to be physically restrained multiple times by staff in the room.  His only comment that was understandable was a profanity when the sitter tried to feed him.  Patient last had a measured QTC almost 600.  Significant cardiac problems.  I ordered a new EKG 2 days ago but it has not been done presumably because of his inability to cooperate.  Past Psychiatric History: No significant past psychiatric history  Risk to Self:   Risk to Others:   Prior Inpatient Therapy:   Prior Outpatient Therapy:    Past Medical History:  Past Medical History:  Diagnosis Date  . CAD (coronary artery disease)   . Chronic systolic (congestive) heart failure (HCC)   . Diabetes mellitus without complication (HCC)   . GERD (gastroesophageal reflux disease)    . Gout   . Hx of aortic valve replacement    Bioprosthetic valve  . Hypertension     Past Surgical History:  Procedure Laterality Date  . CORONARY STENT INTERVENTION N/A 07/25/2017   Procedure: CORONARY STENT INTERVENTION;  Surgeon: Alwyn Pea, MD;  Location: [facility] INVASIVE CV LAB;  Service: Cardiovascular;  Laterality: N/A;  . LEFT HEART CATH AND CORONARY ANGIOGRAPHY N/A 07/25/2017   Procedure: LEFT HEART CATH AND CORONARY ANGIOGRAPHY;  Surgeon: Lamar Blinks, MD;  Location: [facility] INVASIVE CV LAB;  Service: Cardiovascular;  Laterality: N/A;  . RIGHT/LEFT HEART CATH AND CORONARY ANGIOGRAPHY N/A 01/24/2020   Procedure: RIGHT/LEFT HEART CATH AND CORONARY ANGIOGRAPHY;  Surgeon: Iran Ouch, MD;  Location: [facility] INVASIVE CV LAB;  Service: Cardiovascular;  Laterality: N/A;   Family History:  Family History  Problem Relation Age of Onset  . CAD Mother   . CAD Father    Family Psychiatric  History: See previous Social History:  Social History   Substance and Sexual Activity  Alcohol Use Not Currently     Social History   Substance and Sexual Activity  Drug Use Yes  . Types: Marijuana   Comment: today    Social History   Socioeconomic History  . Marital status: Divorced    Spouse name: Not on file  . Number of children: Not on file  . Years of education: Not on file  . Highest education level: Not on file  Occupational History  . Not on file  Tobacco Use  . Smoking status: Current Every Day Smoker    Packs/day: 0.50    Types: Cigarettes    Last attempt to quit: 08/10/2017    Years since quitting: 2.4  . Smokeless tobacco: Never Used  Vaping Use  . Vaping Use: Never used  Substance and Sexual Activity  . Alcohol use: Not Currently  . Drug use: Yes    Types: Marijuana    Comment: today  . Sexual activity: Not on file  Other Topics Concern  . Not on file  Social History Narrative  . Not on file   Social Determinants of Health   Financial Resource  Strain:   . Difficulty of Paying Living Expenses: Not on file  Food Insecurity:   . Worried About Programme researcher, broadcasting/film/video in the Last Year: Not on file  . Ran Out of Food in the Last Year: Not on file  Transportation Needs:   . Lack of Transportation (Medical): Not on file  . Lack of Transportation (Non-Medical): Not on file  Physical Activity:   . Days of Exercise per Week: Not on file  . Minutes of Exercise per Session: Not on file  Stress:   . Feeling of Stress : Not on file  Social Connections:   . Frequency of Communication with Friends and Family: Not on file  . Frequency of Social Gatherings with Friends and Family: Not on file  . Attends Religious Services: Not on file  . Active Member of Clubs or Organizations: Not on file  . Attends Banker Meetings: Not on file  . Marital Status: Not on file   Additional Social History:    Allergies:   Allergies  Allergen Reactions  . Penicillins Other (See Comments)    Has patient had a PCN reaction causing immediate rash, facial/tongue/throat swelling, SOB or lightheadedness with hypotension: Unknown Has patient had a PCN reaction causing severe rash involving mucus membranes or skin necrosis: Unknown Has patient had a PCN reaction that required hospitalization: Unknown Has patient had a PCN reaction occurring within the last 10 years: No If all of the above answers are "NO", then may proceed with Cephalosporin use.     Labs:  Results for orders placed or performed during the hospital encounter of 01/06/20 (from the past 48 hour(s))  Glucose, capillary     Status: Abnormal   Collection Time: 02/05/20  8:36 PM  Result Value Ref Range   Glucose-Capillary 128 (H) 70 - 99 mg/dL    Comment: Glucose reference range applies only to samples taken after fasting for at least 8 hours.  Glucose, capillary     Status: Abnormal   Collection Time: 02/05/20 11:35 PM  Result Value Ref Range   Glucose-Capillary 117 (H) 70 - 99 mg/dL     Comment: Glucose reference range applies only to samples taken after fasting for at least 8 hours.  Magnesium     Status: None   Collection Time: 02/06/20  4:06 AM  Result Value Ref Range   Magnesium 1.8 1.7 - 2.4 mg/dL    Comment: Performed at [facility], [address] [place], [place] [address]  Phosphorus     Status: None   Collection Time: 02/06/20  4:06 AM  Result Value Ref Range   Phosphorus 4.1 2.5 - 4.6 mg/dL    Comment: Performed at [facility], [address] [place], [place] [address]  CBC with Differential/Platelet     Status: Abnormal   Collection Time:  02/06/20  4:06 AM  Result Value Ref Range   WBC 12.0 (H) 4.0 - 10.5 K/uL   RBC 7.16 (H) 4.22 - 5.81 MIL/uL   Hemoglobin 15.3 13.0 - 17.0 g/dL   HCT 09.8 39 - 52 %   MCV 71.2 (L) 80.0 - 100.0 fL   MCH 21.4 (L) 26.0 - 34.0 pg   MCHC 30.0 30.0 - 36.0 g/dL   RDW 11.9 (H) 14.7 - 82.9 %   Platelets 619 (H) 150 - 400 K/uL   nRBC 0.0 0.0 - 0.2 %   Neutrophils Relative % 55 %   Neutro Abs 6.5 1.7 - 7.7 K/uL   Lymphocytes Relative 21 %   Lymphs Abs 2.6 0.7 - 4.0 K/uL   Monocytes Relative 10 %   Monocytes Absolute 1.2 (H) 0.1 - 1.0 K/uL   Eosinophils Relative 11 %   Eosinophils Absolute 1.3 (H) 0.0 - 0.5 K/uL   Basophils Relative 2 %   Basophils Absolute 0.3 (H) 0.0 - 0.1 K/uL   WBC Morphology MORPHOLOGY UNREMARKABLE    RBC Morphology HIDE    Smear Review Normal platelet morphology    Immature Granulocytes 1 %   Abs Immature Granulocytes 0.13 (H) 0.00 - 0.07 K/uL   Dimorphism PRESENT     Comment: Performed at [facility], [address] [place], [place] [address]  Basic metabolic panel     Status: Abnormal   Collection Time: 02/06/20  4:06 AM  Result Value Ref Range   Sodium 137 135 - 145 mmol/L   Potassium 4.1 3.5 - 5.1 mmol/L   Chloride 104 98 - 111 mmol/L   CO2 23 22 - 32 mmol/L   Glucose, Bld 121 (H) 70 - 99 mg/dL    Comment: Glucose reference range applies only to  samples taken after fasting for at least 8 hours.   BUN 14 8 - 23 mg/dL   Creatinine, Ser 0.86 0.61 - 1.24 mg/dL   Calcium 9.1 8.9 - 57.8 mg/dL   GFR, Estimated >46 >96 mL/min    Comment: (NOTE) Calculated using the CKD-EPI Creatinine Equation (2021)    Anion gap 10 5 - 15    Comment: Performed at [facility], [address] [place], [place] [address]  Glucose, capillary     Status: Abnormal   Collection Time: 02/06/20  4:07 AM  Result Value Ref Range   Glucose-Capillary 132 (H) 70 - 99 mg/dL    Comment: Glucose reference range applies only to samples taken after fasting for at least 8 hours.  Glucose, capillary     Status: Abnormal   Collection Time: 02/06/20  9:02 AM  Result Value Ref Range   Glucose-Capillary 138 (H) 70 - 99 mg/dL    Comment: Glucose reference range applies only to samples taken after fasting for at least 8 hours.   Comment 1 Notify RN   Glucose, capillary     Status: Abnormal   Collection Time: 02/06/20 11:30 AM  Result Value Ref Range   Glucose-Capillary 109 (H) 70 - 99 mg/dL    Comment: Glucose reference range applies only to samples taken after fasting for at least 8 hours.   Comment 1 Notify RN   Glucose, capillary     Status: Abnormal   Collection Time: 02/06/20  4:07 PM  Result Value Ref Range   Glucose-Capillary 130 (H) 70 - 99 mg/dL    Comment: Glucose reference range applies only to samples taken after fasting for at least 8 hours.  Comment 1 Notify RN   Glucose, capillary     Status: Abnormal   Collection Time: 02/06/20  8:45 PM  Result Value Ref Range   Glucose-Capillary 110 (H) 70 - 99 mg/dL    Comment: Glucose reference range applies only to samples taken after fasting for at least 8 hours.  Glucose, capillary     Status: Abnormal   Collection Time: 02/07/20 12:26 AM  Result Value Ref Range   Glucose-Capillary 182 (H) 70 - 99 mg/dL    Comment: Glucose reference range applies only to samples taken after fasting for at least 8  hours.  Glucose, capillary     Status: Abnormal   Collection Time: 02/07/20  4:05 AM  Result Value Ref Range   Glucose-Capillary 133 (H) 70 - 99 mg/dL    Comment: Glucose reference range applies only to samples taken after fasting for at least 8 hours.  CBC with Differential/Platelet     Status: Abnormal   Collection Time: 02/07/20  5:50 AM  Result Value Ref Range   WBC 10.9 (H) 4.0 - 10.5 K/uL   RBC 7.70 (H) 4.22 - 5.81 MIL/uL   Hemoglobin 17.1 (H) 13.0 - 17.0 g/dL   HCT 54.6 (H) 39 - 52 %   MCV 70.9 (L) 80.0 - 100.0 fL   MCH 22.2 (L) 26.0 - 34.0 pg   MCHC 31.3 30.0 - 36.0 g/dL   RDW 27.0 (H) 35.0 - 09.3 %   Platelets 693 (H) 150 - 400 K/uL   nRBC 0.0 0.0 - 0.2 %   Neutrophils Relative % 51 %   Neutro Abs 5.5 1.7 - 7.7 K/uL   Lymphocytes Relative 20 %   Lymphs Abs 2.2 0.7 - 4.0 K/uL   Monocytes Relative 13 %   Monocytes Absolute 1.4 (H) 0.1 - 1.0 K/uL   Eosinophils Relative 12 %   Eosinophils Absolute 1.3 (H) 0.0 - 0.5 K/uL   Basophils Relative 3 %   Basophils Absolute 0.4 (H) 0.0 - 0.1 K/uL   WBC Morphology MORPHOLOGY UNREMARKABLE    Smear Review Normal platelet morphology    Immature Granulocytes 1 %   Abs Immature Granulocytes 0.12 (H) 0.00 - 0.07 K/uL   Dimorphism PRESENT    Polychromasia PRESENT     Comment: Performed at [facility], [address], [place], [place] [address]  Magnesium     Status: None   Collection Time: 02/07/20  5:50 AM  Result Value Ref Range   Magnesium 1.9 1.7 - 2.4 mg/dL    Comment: Performed at [facility], [address] [place], [place] [address]  Basic metabolic panel     Status: None   Collection Time: 02/07/20  5:50 AM  Result Value Ref Range   Sodium 138 135 - 145 mmol/L   Potassium 4.5 3.5 - 5.1 mmol/L   Chloride 104 98 - 111 mmol/L   CO2 22 22 - 32 mmol/L   Glucose, Bld 93 70 - 99 mg/dL    Comment: Glucose reference range applies only to samples taken after fasting for at least 8 hours.   BUN 17 8 - 23  mg/dL   Creatinine, Ser 9.67 0.61 - 1.24 mg/dL   Calcium 9.6 8.9 - 89.3 mg/dL   GFR, Estimated >81 >01 mL/min    Comment: (NOTE) Calculated using the CKD-EPI Creatinine Equation (2021)    Anion gap 12 5 - 15    Comment: Performed at [facility], [address] [place], [place]  [address]  Glucose, capillary     Status: Abnormal   Collection Time: 02/07/20  8:41 AM  Result Value Ref Range   Glucose-Capillary 200 (H) 70 - 99 mg/dL    Comment: Glucose reference range applies only to samples taken after fasting for at least 8 hours.   Comment 1 Notify RN   Glucose, capillary     Status: Abnormal   Collection Time: 02/07/20 12:46 PM  Result Value Ref Range   Glucose-Capillary 116 (H) 70 - 99 mg/dL    Comment: Glucose reference range applies only to samples taken after fasting for at least 8 hours.   Comment 1 Notify RN   Glucose, capillary     Status: Abnormal   Collection Time: 02/07/20  3:57 PM  Result Value Ref Range   Glucose-Capillary 142 (H) 70 - 99 mg/dL    Comment: Glucose reference range applies only to samples taken after fasting for at least 8 hours.    Current Facility-Administered Medications  Medication Dose Route Frequency Provider Last Rate Last Admin  . 0.45 % sodium chloride infusion   Intravenous Continuous Pieter Partridge, MD   Paused at 02/07/20 1442  . 0.9 %  sodium chloride infusion   Intravenous PRN Vida Rigger, MD   Stopped at 02/05/20 1043  . acetaminophen (TYLENOL) tablet 650 mg  650 mg Oral Q4H PRN Kasa, Kurian, MD      . albuterol (VENTOLIN HFA) 108 (90 Base) MCG/ACT inhaler 2 puff  2 puff Inhalation Q4H PRN Lorine Bears A, MD      . allopurinol (ZYLOPRIM) tablet 300 mg  300 mg Oral Daily Erin Fulling, MD   300 mg at 02/07/20 1226  . amiodarone (PACERONE) tablet 400 mg  400 mg Oral BID Antonieta Iba, MD   400 mg at 02/07/20 1248  . apixaban (ELIQUIS) tablet 5 mg  5 mg Oral BID Vida Rigger, MD   5 mg at 02/07/20 1226  .  atorvastatin (LIPITOR) tablet 40 mg  40 mg Oral QHS Erin Fulling, MD   40 mg at 02/06/20 2149  . bisacodyl (DULCOLAX) suppository 10 mg  10 mg Rectal Daily PRN Pokhrel, Laxman, MD      . carvedilol (COREG) tablet 6.25 mg  6.25 mg Oral BID WC Debbe Odea, MD   6.25 mg at 02/07/20 1655  . chlorhexidine (PERIDEX) 0.12 % solution 15 mL  15 mL Mouth Rinse BID Rust-Chester, Britton L, NP   15 mL at 02/07/20 1226  . Chlorhexidine Gluconate Cloth 2 % PADS 6 each  6 each Topical Daily Iran Ouch, MD   6 each at 02/07/20 1154  . docusate (COLACE) 50 MG/5ML liquid 100 mg  100 mg Oral BID Erin Fulling, MD   100 mg at 02/07/20 1227  . famotidine (PEPCID) tablet 20 mg  20 mg Oral Daily Sharen Hones, RPH   20 mg at 02/07/20 1226  . feeding supplement (ENSURE ENLIVE / ENSURE PLUS) liquid 237 mL  237 mL Oral TID BM Pokhrel, Laxman, MD   237 mL at 02/06/20 2148  . [START ON 02/08/2020] furosemide (LASIX) tablet 20 mg  20 mg Oral Daily Dunn, Ryan M, PA-C      . insulin aspart (novoLOG) injection 0-9 Units  0-9 Units Subcutaneous Q4H Iran Ouch, MD   1 Units at 02/07/20 1654  . LORazepam (ATIVAN) injection 0.5 mg  0.5 mg Intravenous Q4H PRN Adonys Wildes T, MD   0.5 mg at 02/07/20 1324  .  MEDLINE mouth rinse  15 mL Mouth Rinse q12n4p Rust-Chester, Britton L, NP   15 mL at 02/07/20 1657  . melatonin tablet 5 mg  5 mg Oral QHS Vida Rigger, MD   5 mg at 02/06/20 2150  . metoprolol tartrate (LOPRESSOR) injection 5 mg  5 mg Intravenous Q2H PRN Iran Ouch, MD   5 mg at 01/25/20 1259  . multivitamin with minerals tablet 1 tablet  1 tablet Oral Daily Pokhrel, Laxman, MD   1 tablet at 02/07/20 1226  . OLANZapine (ZYPREXA) injection 5 mg  5 mg Intramuscular QPM Pokhrel, Laxman, MD   5 mg at 02/06/20 1656  . ondansetron (ZOFRAN) injection 4 mg  4 mg Intravenous Q6H PRN Lorine Bears A, MD      . polyethylene glycol (MIRALAX / GLYCOLAX) packet 17 g  17 g Oral Daily Erin Fulling, MD   17 g at  02/07/20 1226  . polyethylene glycol (MIRALAX / GLYCOLAX) packet 17 g  17 g Oral Daily PRN Erin Fulling, MD      . Melene Muller ON 02/08/2020] potassium chloride (KLOR-CON) packet 20 mEq  20 mEq Oral Daily Sharen Hones, RPH      . sacubitril-valsartan (ENTRESTO) 24-26 mg per tablet  1 tablet Oral BID End, Cristal Deer, MD   1 tablet at 02/07/20 1225  . senna-docusate (Senokot-S) tablet 1 tablet  1 tablet Oral BID Erin Fulling, MD   1 tablet at 02/07/20 1226  . sodium chloride flush (NS) 0.9 % injection 10-40 mL  10-40 mL Intracatheter Q12H Lorine Bears A, MD   30 mL at 02/07/20 1057  . sodium chloride flush (NS) 0.9 % injection 3 mL  3 mL Intravenous Q12H Lorine Bears A, MD   3 mL at 02/06/20 2150  . spironolactone (ALDACTONE) tablet 12.5 mg  12.5 mg Oral Daily Debbe Odea, MD   12.5 mg at 02/06/20 3810    Musculoskeletal: Strength & Muscle Tone: decreased Gait & Station: unsteady, unable to stand Patient leans: N/A  Psychiatric Specialty Exam: Physical Exam Vitals and nursing note reviewed.  Constitutional:      Appearance: He is ill-appearing.  HENT:     Head: Normocephalic and atraumatic.  Eyes:     Conjunctiva/sclera: Conjunctivae normal.  Cardiovascular:     Heart sounds: Normal heart sounds.  Pulmonary:     Effort: Pulmonary effort is normal.  Abdominal:     Palpations: Abdomen is soft.  Musculoskeletal:        General: Normal range of motion.     Cervical back: Normal range of motion.  Skin:    General: Skin is warm and dry.  Psychiatric:        Attention and Perception: He is inattentive.        Mood and Affect: Affect is inappropriate.        Speech: He is noncommunicative.        Behavior: Behavior is agitated.        Cognition and Memory: Cognition is impaired.     Review of Systems  Unable to perform ROS: Patient nonverbal    Blood pressure 109/78, pulse 72, temperature 98.4 F (36.9 C), resp. rate 16, height 5' 7.01" (1.702 m), weight 75.8 kg, SpO2  99 %.Body mass index is 26.15 kg/m.  General Appearance: Disheveled  Eye Contact:  None  Speech:  Garbled  Volume:  Decreased  Mood:  Irritable  Affect:  Constricted  Thought Process:  NA  Orientation:  Negative  Thought Content:  Negative  Suicidal Thoughts:  Unknown but presumably not an issue  Homicidal Thoughts:  Also unknown but does not seem to be trying to hurt anyone  Memory:  Negative  Judgement:  Negative  Insight:  Negative  Psychomotor Activity:  Restlessness  Concentration:  Concentration: Poor  Recall:  Poor  Fund of Knowledge:  Poor  Language:  Poor  Akathisia:  No  Handed:  Right  AIMS (if indicated):     Assets:  Social Support  ADL's:  Impaired  Cognition:  Impaired,  Severe  Sleep:        Treatment Plan Summary: Medication management and Plan Patient continues to be agitated most of the time.  Requires protective mitts to prevent him from pulling out his Foley or IV lines.  As I noted on Wednesday, I would usually suggest intravenous Haldol for first line for this level of delirium, but because of his extended QTC and cardiac problems and the risk of intravenous haloperidol 2 causing increased QTC I am dubious of the potential risk.  For that reason I had ordered Ativan as a as needed but this seems to be ineffective.  Unfortunately benzodiazepines often are ineffective or even make things worsen delirium.  The safest antipsychotic is probably Zyprexa from a cardiac standpoint but unfortunately it is usually only a little bit helpful for agitated delirium.  I will try putting in orders for somewhat more frequent IM shots of Zyprexa.  I still think getting a repeat EKG would be very helpful.  Disposition: Continued monitoring.  Over the weekend I will sign the patient out but please call the psychiatrist on the unit if needed.  He will be covering consults for the weekend.  That will be Dr. Joseph Art.  Mordecai Rasmussen, MD 02/07/2020 5:25 PM

## 2020-02-07 NOTE — Progress Notes (Signed)
Per Dr. Luberta Robertson okay to give scheduled  entresto and amiodarone. Hold aldactone. Will continue to monitor

## 2020-02-07 NOTE — Progress Notes (Signed)
Occupational Therapy Treatment Patient Details Name: Travis Maxwell MRN: [id number] DOB: 1951-02-27 Today's Date: 02/07/2020    History of present illness presented to ER secondary to CP, SOB; admitted for mangement of severe acute hypoxic and hypercapnic respiratory failure, acute toxic metabolic encephalopathy and cardiogenic shock.  Hospital course significacnt for intubation 11/1-11/6, 11/14-11/20, currently on RA; L/R heart cath (11/19) with no obstuctive lesions noted, EF approx 20%.  Recommended for lifevest at DC with further evaluation for AICD placement..   OT comments  Pt seen for OT treatment this date to f/u re: safety with ADLs/ADL mobility. OT hears CNA and sitter calling out for help from this pt's room who is known to OT services and this Chartered loss adjuster. Upon entering room, it's noted that pt is attempting to get OOB. OT offers to assist to facilitate pt participation in fxl mobility as he was much calmer and able to rest after working with therapy yesterday. Nursing team present and agreeable to assisting to help in this endeavor. Pt requires MIN/MOD A +2 for fxl mobility in the to the door and back. Pt becomes fatigued after making it to door requiring seated rest break. Sitter was present with a chair follow to help facilitate this break. CNA assisting with line/lead mgt while sitter continues with chair follow back into room and OT/RN providing stabilizing support bilaterally. OT facilitates safety verbal/tactile cues to help pt avoid fall hazards/obstacles as well as cues for general safety with fxl mobility including attn to task and negotiating hallway/doorway. Pt left in bed with 1:1 sitter present with RN attending to his lines/needs as pt has torn his IV line. Will continue to attempt to progress pt with his fxl mobility/ADLs, he did require more assistance this date 2/2 mentation. Continue to recommend SNF at this time.   Follow Up Recommendations  SNF    Equipment Recommendations   Other (comment) (defer to next level of care)    Recommendations for Other Services      Precautions / Restrictions Precautions Precautions: Fall Restrictions Weight Bearing Restrictions: No       Mobility Bed Mobility Overal bed mobility: Needs Assistance Bed Mobility: Supine to Sit;Sit to Supine     Supine to sit: Min guard Sit to supine: Max assist   General bed mobility comments: MAX A for sit to sup as pt resisting, but he quickly falls asleep upon laying flat and turning onto his R side  Transfers Overall transfer level: Needs assistance Equipment used: 2 person hand held assist Transfers: Sit to/from Stand Sit to Stand: Min assist;Mod assist;+2 physical assistance;+2 safety/equipment         General transfer comment: MIN/MOD A +2 for line/lead mgt, CNA with SBA for safety and sitter with chair follow as pt is unpredictable.    Balance Overall balance assessment: Needs assistance Sitting-balance support: Feet supported;Bilateral upper extremity supported Sitting balance-Leahy Scale: Fair     Standing balance support: Bilateral upper extremity supported Standing balance-Leahy Scale: Poor Standing balance comment: requires UE support B'ly this date d/t increased impulsivity.                           ADL either performed or assessed with clinical judgement   ADL Overall ADL's : Needs assistance/impaired                                     Functional  mobility during ADLs: Minimal assistance;Moderate assistance;+2 for safety/equipment;+2 for physical assistance Psychiatrist following with chair, CNA with SBA while RN/OT with MIN/MOD A bilaterally as pt is intermittently combative and unpredictable.)       Vision   Additional Comments: difficult to assess d/t cognition   Perception     Praxis      Cognition Arousal/Alertness: Lethargic Behavior During Therapy: Restless;Agitated;Flat affect;Impulsive Overall Cognitive Status:  Impaired/Different from baseline Area of Impairment: Orientation;Attention;Memory;Following commands;Safety/judgement;Awareness;Problem solving                 Orientation Level: Disoriented to;Time;Situation;Place;Person (minimal verbalizations this date.) Current Attention Level: Sustained Memory: Decreased short-term memory Following Commands: Follows one step commands inconsistently;Follows one step commands with increased time Safety/Judgement: Decreased awareness of safety;Decreased awareness of deficits Awareness: Emergent Problem Solving: Slow processing;Decreased initiation;Difficulty sequencing;Requires verbal cues;Requires tactile cues General Comments: Pt follows <50% simple one step commands with tactile verbal cues. Pt requires extensive assist for safety this date as he is restless and combative. OT enters room as the sitter and CNA call out for help. Because pt is restless, OT suggests activity/functional mobility to use some of his energy and hopefully assist with calming pt.        Exercises Other Exercises Other Exercises: OT hears CNA and sitter calling out for help from this pt's room who is known to OT services and this Chartered loss adjuster. Upon entering room, it's noted that pt is attempting to get OOB. OT offers to assist to facilitate pt participation in fxl mobility as he was much calmer and able to rest after working with therapy yesterday. Nursing team present and agreeable to assisting to help in this endeavor. Pt requires MIN/MOD A +2 for fxl mobility in the to the door and back. Pt becomes fatigued after making it to door requiring seated rest break. Sitter was present with a chair follow to help facilitate this break. CNA assisting with line/lead mgt while sitter continues with chair follow back into room and OT/RN providing stabilizing support bilaterally. OT facilitates safety verbal/tactile cues to help pt avoid fall hazards/obstacles as well as cues for general safety  with fxl mobility including attn to task and negotiating hallway/doorway. Pt left in bed with 1:1 sitter present with RN attending to his lines/needs as pt has torn his IV line. Will continue to attempt to progress pt with his fxl mobility/ADLs, he did require more assistance this date 2/2 mentation.   Shoulder Instructions       General Comments      Pertinent Vitals/ Pain       Pain Assessment: Faces Faces Pain Scale: No hurt  Home Living                                          Prior Functioning/Environment              Frequency  Min 1X/week        Progress Toward Goals  OT Goals(current goals can now be found in the care plan section)  Progress towards OT goals: OT to reassess next treatment  Acute Rehab OT Goals Patient Stated Goal: none stated OT Goal Formulation: Patient unable to participate in goal setting Time For Goal Achievement: 02/12/20 Potential to Achieve Goals: Good  Plan Discharge plan remains appropriate    Co-evaluation  AM-PAC OT "6 Clicks" Daily Activity     Outcome Measure   Help from another person eating meals?: A Little Help from another person taking care of personal grooming?: A Little Help from another person toileting, which includes using toliet, bedpan, or urinal?: Total Help from another person bathing (including washing, rinsing, drying)?: Total Help from another person to put on and taking off regular upper body clothing?: A Lot Help from another person to put on and taking off regular lower body clothing?: Total 6 Click Score: 11    End of Session Equipment Utilized During Treatment: Gait belt  OT Visit Diagnosis: Unsteadiness on feet (R26.81);Muscle weakness (generalized) (M62.81)   Activity Tolerance Treatment limited secondary to agitation   Patient Left in bed;with call bell/phone within reach;with bed alarm set;with nursing/sitter in room (RN an sitter present)   Nurse  Communication Mobility status        Time: 1423-1440 OT Time Calculation (min): 17 min  Charges: OT General Charges $OT Visit: 1 Visit OT Treatments $Therapeutic Activity: 8-22 mins   Rejeana Brock, MS, OTR/L ascom [phone] 02/07/20, 2:59 PM

## 2020-02-07 NOTE — Progress Notes (Signed)
Progress Note  Patient Name: Travis Maxwell Date of Encounter: 02/07/2020  Primary Cardiologist: Kirke Corin  Subjective   Sitter at bedside.  No history able to be obtained from patient secondary to encephalopathy.  No acute overnight events.  Inpatient Medications    Scheduled Meds: . allopurinol  300 mg Oral Daily  . amiodarone  400 mg Oral BID  . apixaban  5 mg Oral BID  . atorvastatin  40 mg Oral QHS  . carvedilol  6.25 mg Oral BID WC  . chlorhexidine  15 mL Mouth Rinse BID  . Chlorhexidine Gluconate Cloth  6 each Topical Daily  . docusate  100 mg Oral BID  . famotidine  20 mg Oral Daily  . feeding supplement  237 mL Oral TID BM  . [START ON 02/08/2020] furosemide  20 mg Oral Daily  . insulin aspart  0-9 Units Subcutaneous Q4H  . mouth rinse  15 mL Mouth Rinse q12n4p  . melatonin  5 mg Oral QHS  . multivitamin with minerals  1 tablet Oral Daily  . OLANZapine  5 mg Intramuscular QPM  . polyethylene glycol  17 g Oral Daily  . [START ON 02/08/2020] potassium chloride  20 mEq Oral Daily  . sacubitril-valsartan  1 tablet Oral BID  . senna-docusate  1 tablet Oral BID  . sodium chloride flush  10-40 mL Intracatheter Q12H  . sodium chloride flush  3 mL Intravenous Q12H  . spironolactone  12.5 mg Oral Daily   Continuous Infusions: . sodium chloride Stopped (02/05/20 0628)   PRN Meds: sodium chloride, acetaminophen, albuterol, bisacodyl, LORazepam, metoprolol tartrate, ondansetron (ZOFRAN) IV, polyethylene glycol   Vital Signs    Vitals:   02/06/20 1652 02/06/20 1916 02/07/20 0432 02/07/20 0843  BP: 126/72 (!) 150/84 102/70 103/71  Pulse: 67 73 88 77  Resp: 16 17 17 18   Temp: 98.2 F (36.8 C) 97.7 F (36.5 C) 97.6 F (36.4 C) 97.7 F (36.5 C)  TempSrc: Oral Oral Oral Oral  SpO2: 100% 90% 100% 100%  Weight:      Height:        Intake/Output Summary (Last 24 hours) at 02/07/2020 1052 Last data filed at 02/07/2020 0700 Gross per 24 hour  Intake 100 ml  Output 1550  ml  Net -1450 ml   Filed Weights   01/30/20 0326 02/05/20 0338 02/06/20 0408  Weight: 72 kg 66.7 kg 75.8 kg    Telemetry    SR - Personally Reviewed  ECG    No new tracings - Personally Reviewed  Physical Exam   GEN: No acute distress.   Neck: No JVD. Cardiac: RRR, no murmurs, rubs, or gallops.  Respiratory: Clear to auscultation bilaterally.  GI: Soft, nontender, non-distended.   MS: No edema; No deformity. Neuro:  Alert; Nonfocal.  Psych: Normal affect.  Labs    Chemistry Recent Labs  Lab 02/05/20 0349 02/06/20 0406 02/07/20 0550  NA 137 137 138  K 3.7 4.1 4.5  CL 104 104 104  CO2 22 23 22   GLUCOSE 306* 121* 93  BUN 14 14 17   CREATININE 0.79 0.96 0.94  CALCIUM 8.2* 9.1 9.6  GFRNONAA >60 >60 >60  ANIONGAP 11 10 12      Hematology Recent Labs  Lab 02/05/20 0349 02/06/20 0406 02/07/20 0550  WBC 11.1* 12.0* 10.9*  RBC 6.59* 7.16* 7.70*  HGB 14.4 15.3 17.1*  HCT 45.8 51.0 54.6*  MCV 69.5* 71.2* 70.9*  MCH 21.9* 21.4* 22.2*  MCHC 31.4 30.0 31.3  RDW 28.0* 28.1* 28.6*  PLT 655* 619* 693*    Cardiac EnzymesNo results for input(s): TROPONINI in the last 168 hours. No results for input(s): TROPIPOC in the last 168 hours.   BNPNo results for input(s): BNP, PROBNP in the last 168 hours.   DDimer No results for input(s): DDIMER in the last 168 hours.   Radiology    No results found.  Cardiac Studies   LHC 01/24/20:  Previously placed Prox RCA to Mid RCA stent (unknown type) is widely patent.  Prox RCA lesion is 50% stenosed.  1. Patent stent in a nondominant right coronary artery. No significant coronary artery disease overall. 2. The replaced aortic valve was not crossed. 3. Right heart catheterization showed mildly elevated filling pressures, minimal pulmonary hypertension and normal cardiac output.  Recommendations: The patient has no ischemic substrate for ventricular tachycardia. He has nonischemic cardiomyopathy. His  hemodynamics appear good overall. Recommend continuing same dose of IV furosemide 20 mg daily which can be switched to an oral diuretic after extubation. I am going to stop lidocaine drip and continue amiodarone drip which can subsequently be switched to oral after extubation. I elected to add small dose carvedilol to assist with his cardiomyopathy and ventricular tachycardia.  Echo 01/21/20: 1. Left ventricular ejection fraction, by estimation, is 20 to 25%. The  left ventricle has severely decreased function. The left ventricle  demonstrates regional wall motion abnormalities (see scoring  diagram/findings for description). The left  ventricular internal cavity size was moderately dilated. There is mild  left ventricular hypertrophy. Left ventricular diastolic parameters are  consistent with Grade I diastolic dysfunction (impaired relaxation).  Elevated left atrial pressure. There is  severe global hypokinesis with relative sparing of the basal segments.  2. Right ventricular systolic function is moderately reduced. The right  ventricular size is mildly enlarged. Tricuspid regurgitation signal is  inadequate for assessing PA pressure.  3. Left atrial size was mildly dilated.  4. The mitral valve is grossly normal. Mild mitral valve regurgitation.  No evidence of mitral stenosis.  5. The aortic valve was not well visualized. Aortic valve regurgitation  is not visualized. Echo findings are consistent with normal structure and  function of the aortic valve prosthesis.  6. Pulmonic valve regurgitation not well assessed.  Patient Profile     69 y.o. male with history of CAD status post PCI, chronic systolic diastolic heart failure, diabetes, tobacco abuse, status post bioprosthetic aortic valve, paroxysmal atrial fibrillation, hypertension, hyperlipidemia, and THC abuse admitted with ventricular tachycardia requiring defibrillation and hypoxic respiratory failure due to MRSA and  Pseudomonas pneumonia.   Assessment & Plan    1. Sustained VT: -Presented with chest pain and shortness of breath on 11/1 with worsening respiratory distress in the ER leading to intubation for airway protection.  Patient had pulseless VT requiring defibrillation. He subsequently underwent left heart cath which showed nonobstructive CAD. Amiodarone was initially held due to concern for QT prolongation. However per EP it was recommended to be resumed. He was also transiently on lidocaine which has been discontinued -Continue oral amiodarone 400 mg twice daily for 1 week (started on 12/2) followed by 20 mg twice daily for 1 week followed by 20 mg daily thereafter -Continue Coreg -In his current state of delirium/psychosis he is not a candidate for LifeVest and ultimately may not be a good candidate for ICD  -Maintain potassium greater than 4.0 and magnesium greater than 2.0  2. CAD s/p PCI: -No symptoms concerning for angina -Cath  this admission with nonobstructive disease and patent stents -On Eliquis place of aspirin -Continue carvedilol, and Lipitor  3. Chronic combined systolic and diastolic CHF: -He appears euvolemic and well compensated -Continue Entresto, carvedilol, spironolactone -Transition IV Lasix to oral starting on 12/4 -Look to escalate GDMT throughout his admission and in hospital follow-up -Daily weights -Strict I's and O's  4. PAF: -Maintaining sinus rhythm -Continue amiodarone and Coreg as above -CHA2DS2-VASc 5 -Eliquis  5.  Delirium/psychosis: -Alert though not conversive -Unclear if this is a cerebral injury from anoxia given profound hypotension/VT this admission -Sitter at bedside -No family present  For questions or updates, please contact [facility] Please consult [email or web address] for contact info under Cardiology/STEMI.    Signed, Eula Listen, PA-C [facility] Pager: [phone] 02/07/2020, 10:52 AM

## 2020-02-07 NOTE — Progress Notes (Addendum)
Pt BP 80/56 MAP 65. Pt asymptomatic on assessment and have a continuous fluid 0.45 % sodium running at 7ml/hr. Notify NP Jon Billings. Will continue to monitor.  Update 0034: NP Jon Billings ordered albumin human 25 % solution 25 g Intravenous once. Will continue to monitor.  Update 0253: Pt BP at 84/59 MAP 68 after the albumin notify NP Jon Billings. Will continue to monitor.  Update 0256: NO New order was placed per NP Jon Billings monitor at this time. Will continue to monitor.

## 2020-02-08 DIAGNOSIS — I5023 Acute on chronic systolic (congestive) heart failure: Secondary | ICD-10-CM | POA: Diagnosis not present

## 2020-02-08 DIAGNOSIS — I472 Ventricular tachycardia: Secondary | ICD-10-CM | POA: Diagnosis not present

## 2020-02-08 LAB — CBC WITH DIFFERENTIAL/PLATELET
Abs Immature Granulocytes: 0.09 10*3/uL — ABNORMAL HIGH (ref 0.00–0.07)
Basophils Absolute: 0.3 10*3/uL — ABNORMAL HIGH (ref 0.0–0.1)
Basophils Relative: 3 %
Eosinophils Absolute: 1.1 10*3/uL — ABNORMAL HIGH (ref 0.0–0.5)
Eosinophils Relative: 9 %
HCT: 52.1 % — ABNORMAL HIGH (ref 39.0–52.0)
Hemoglobin: 16.1 g/dL (ref 13.0–17.0)
Immature Granulocytes: 1 %
Lymphocytes Relative: 17 %
Lymphs Abs: 2 10*3/uL (ref 0.7–4.0)
MCH: 21.7 pg — ABNORMAL LOW (ref 26.0–34.0)
MCHC: 30.9 g/dL (ref 30.0–36.0)
MCV: 70.3 fL — ABNORMAL LOW (ref 80.0–100.0)
Monocytes Absolute: 1.2 10*3/uL — ABNORMAL HIGH (ref 0.1–1.0)
Monocytes Relative: 10 %
Neutro Abs: 7.1 10*3/uL (ref 1.7–7.7)
Neutrophils Relative %: 60 %
Platelets: 693 10*3/uL — ABNORMAL HIGH (ref 150–400)
RBC: 7.41 MIL/uL — ABNORMAL HIGH (ref 4.22–5.81)
RDW: 28.5 % — ABNORMAL HIGH (ref 11.5–15.5)
WBC: 11.8 10*3/uL — ABNORMAL HIGH (ref 4.0–10.5)
nRBC: 0 % (ref 0.0–0.2)

## 2020-02-08 LAB — BASIC METABOLIC PANEL
Anion gap: 14 (ref 5–15)
BUN: 18 mg/dL (ref 8–23)
CO2: 21 mmol/L — ABNORMAL LOW (ref 22–32)
Calcium: 9.7 mg/dL (ref 8.9–10.3)
Chloride: 103 mmol/L (ref 98–111)
Creatinine, Ser: 0.94 mg/dL (ref 0.61–1.24)
GFR, Estimated: >60 mL/min (ref >60)
Glucose, Bld: 109 mg/dL — ABNORMAL HIGH (ref 70–99)
Potassium: 4.2 mmol/L (ref 3.5–5.1)
Sodium: 138 mmol/L (ref 135–145)

## 2020-02-08 LAB — GLUCOSE, CAPILLARY
Glucose-Capillary: 100 mg/dL — ABNORMAL HIGH (ref 70–99)
Glucose-Capillary: 115 mg/dL — ABNORMAL HIGH (ref 70–99)
Glucose-Capillary: 141 mg/dL — ABNORMAL HIGH (ref 70–99)
Glucose-Capillary: 114 mg/dL — ABNORMAL HIGH (ref 70–99)
Glucose-Capillary: 162 mg/dL — ABNORMAL HIGH (ref 70–99)

## 2020-02-08 LAB — MAGNESIUM: Magnesium: 2.1 mg/dL (ref 1.7–2.4)

## 2020-02-08 MED ORDER — ALBUMIN HUMAN 25 % IV SOLN
25.0000 g | Freq: Once | INTRAVENOUS | Status: AC
  Administered 2020-02-08: 25 g via INTRAVENOUS
  Filled 2020-02-08: qty 100

## 2020-02-08 NOTE — Progress Notes (Signed)
   02/07/20 2339  Assess: MEWS Score  Temp 97.8 F (36.6 C)  BP (!) 80/57  Resp 20  SpO2 96 %  O2 Device Room Air  Assess: MEWS Score  MEWS Temp 0  MEWS Systolic 2  MEWS Pulse 0  MEWS RR 0  MEWS LOC 0  MEWS Score 2  MEWS Score Color Yellow  Treat  Pain Scale 0-10  Pain Score 0  Take Vital Signs  Increase Vital Sign Frequency  Yellow: Q 2hr X 2 then Q 4hr X 2, if remains yellow, continue Q 4hrs  Escalate  MEWS: Escalate Yellow: discuss with charge nurse/RN and consider discussing with provider and RRT  Notify: Charge Nurse/RN  Name of Charge Nurse/RN Notified Antonieta Iba  Date Charge Nurse/RN Notified 02/08/20  Time Charge Nurse/RN Notified 0000  Notify: Provider  Provider Name/Title Manuela Schwartz NP  Date Provider Notified 02/07/20  Time Provider Notified 2345  Notification Type Page  Response See new orders  Date of Provider Response 02/08/20  Time of Provider Response 0034  Document  Progress note created (see row info) Yes

## 2020-02-08 NOTE — Progress Notes (Signed)
Progress Note  Patient Name: Travis Maxwell Date of Encounter: 02/08/2020  Primary Cardiologist: Kirke Corin  Subjective   Sitter at bedside.  No history able to be obtained from patient secondary to encephalopathy.  No acute overnight events.  Inpatient Medications    Scheduled Meds: . allopurinol  300 mg Oral Daily  . amiodarone  400 mg Oral BID  . apixaban  5 mg Oral BID  . atorvastatin  40 mg Oral QHS  . carvedilol  6.25 mg Oral BID WC  . chlorhexidine  15 mL Mouth Rinse BID  . Chlorhexidine Gluconate Cloth  6 each Topical Daily  . docusate  100 mg Oral BID  . famotidine  20 mg Oral Daily  . feeding supplement  237 mL Oral TID BM  . furosemide  20 mg Oral Daily  . insulin aspart  0-9 Units Subcutaneous Q4H  . mouth rinse  15 mL Mouth Rinse q12n4p  . melatonin  5 mg Oral QHS  . multivitamin with minerals  1 tablet Oral Daily  . OLANZapine  10 mg Intramuscular Q8H  . polyethylene glycol  17 g Oral Daily  . potassium chloride  20 mEq Oral Daily  . sacubitril-valsartan  1 tablet Oral BID  . senna-docusate  1 tablet Oral BID  . sodium chloride flush  10-40 mL Intracatheter Q12H  . sodium chloride flush  3 mL Intravenous Q12H  . spironolactone  12.5 mg Oral Daily   Continuous Infusions: . sodium chloride Stopped (02/05/20 1043)   PRN Meds: sodium chloride, acetaminophen, albuterol, bisacodyl, metoprolol tartrate, ondansetron (ZOFRAN) IV, polyethylene glycol   Vital Signs    Vitals:   02/08/20 0250 02/08/20 0417 02/08/20 0622 02/08/20 0724  BP: (!) 84/59 103/65 112/61 102/70  Pulse: 66 69 82 81  Resp: 17 18 17 19   Temp: 98 F (36.7 C) 98.5 F (36.9 C) 98.1 F (36.7 C) 97.8 F (36.6 C)  TempSrc: Oral Axillary Axillary   SpO2: 93% 99% 100% 100%  Weight:   64.9 kg   Height:        Intake/Output Summary (Last 24 hours) at 02/08/2020 0740 Last data filed at 02/08/2020 14/06/2019 Gross per 24 hour  Intake 1309.6 ml  Output 650 ml  Net 659.6 ml   Filed Weights    02/05/20 0338 02/06/20 0408 02/08/20 0622  Weight: 66.7 kg 75.8 kg 64.9 kg    Telemetry    SR with occasional PVCs - Personally Reviewed  ECG    No new tracings - Personally Reviewed  Physical Exam   GEN: No acute distress.   Neck: No JVD. Cardiac: RRR, no murmurs, rubs, or gallops.  Respiratory: Clear to auscultation bilaterally.  GI: Soft, nontender, non-distended.   MS: No edema; No deformity. Neuro:  Alert; Nonfocal.  Psych: Normal affect.  Labs    Chemistry Recent Labs  Lab 02/06/20 0406 02/07/20 0550 02/08/20 0533  NA 137 138 138  K 4.1 4.5 4.2  CL 104 104 103  CO2 23 22 21*  GLUCOSE 121* 93 109*  BUN 14 17 18   CREATININE 0.96 0.94 0.94  CALCIUM 9.1 9.6 9.7  GFRNONAA >60 >60 >60  ANIONGAP 10 12 14      Hematology Recent Labs  Lab 02/06/20 0406 02/07/20 0550 02/08/20 0533  WBC 12.0* 10.9* 11.8*  RBC 7.16* 7.70* 7.41*  HGB 15.3 17.1* 16.1  HCT 51.0 54.6* 52.1*  MCV 71.2* 70.9* 70.3*  MCH 21.4* 22.2* 21.7*  MCHC 30.0 31.3 30.9  RDW 28.1*  28.6* 28.5*  PLT 619* 693* 693*    Cardiac EnzymesNo results for input(s): TROPONINI in the last 168 hours. No results for input(s): TROPIPOC in the last 168 hours.   BNPNo results for input(s): BNP, PROBNP in the last 168 hours.   DDimer No results for input(s): DDIMER in the last 168 hours.   Radiology    No results found.  Cardiac Studies   LHC 01/24/20:  Previously placed Prox RCA to Mid RCA stent (unknown type) is widely patent.  Prox RCA lesion is 50% stenosed.  1. Patent stent in a nondominant right coronary artery. No significant coronary artery disease overall. 2. The replaced aortic valve was not crossed. 3. Right heart catheterization showed mildly elevated filling pressures, minimal pulmonary hypertension and normal cardiac output.  Recommendations: The patient has no ischemic substrate for ventricular tachycardia. He has nonischemic cardiomyopathy. His hemodynamics appear good  overall. Recommend continuing same dose of IV furosemide 20 mg daily which can be switched to an oral diuretic after extubation. I am going to stop lidocaine drip and continue amiodarone drip which can subsequently be switched to oral after extubation. I elected to add small dose carvedilol to assist with his cardiomyopathy and ventricular tachycardia.  Echo 01/21/20: 1. Left ventricular ejection fraction, by estimation, is 20 to 25%. The  left ventricle has severely decreased function. The left ventricle  demonstrates regional wall motion abnormalities (see scoring  diagram/findings for description). The left  ventricular internal cavity size was moderately dilated. There is mild  left ventricular hypertrophy. Left ventricular diastolic parameters are  consistent with Grade I diastolic dysfunction (impaired relaxation).  Elevated left atrial pressure. There is  severe global hypokinesis with relative sparing of the basal segments.  2. Right ventricular systolic function is moderately reduced. The right  ventricular size is mildly enlarged. Tricuspid regurgitation signal is  inadequate for assessing PA pressure.  3. Left atrial size was mildly dilated.  4. The mitral valve is grossly normal. Mild mitral valve regurgitation.  No evidence of mitral stenosis.  5. The aortic valve was not well visualized. Aortic valve regurgitation  is not visualized. Echo findings are consistent with normal structure and  function of the aortic valve prosthesis.  6. Pulmonic valve regurgitation not well assessed.  Patient Profile     69 y.o. male with history of CAD status post PCI, chronic systolic diastolic heart failure, diabetes, tobacco abuse, status post bioprosthetic aortic valve, paroxysmal atrial fibrillation, hypertension, hyperlipidemia, and THC abuse admitted with ventricular tachycardia requiring defibrillation and hypoxic respiratory failure due to MRSA and Pseudomonas pneumonia.    Assessment & Plan    1. VT arrest: -Presented with chest pain and shortness of breath on 11/1 with worsening respiratory distress in the ER leading to intubation for airway protection.  Patient had pulseless VT requiring defibrillation. He subsequently underwent left heart cath which showed nonobstructive CAD. Amiodarone was initially held due to concern for QT prolongation. However per EP it was recommended to be resumed. He was also transiently on lidocaine which has been discontinued -Continue oral amiodarone 400 mg twice daily for 1 week (started on 12/2) followed by 20 mg twice daily for 1 week followed by 20 mg daily thereafter -Continue Coreg -In his current state of delirium/psychosis he is not a candidate for LifeVest and ultimately may not be a good candidate for ICD  -Maintain potassium greater than 4.0 and magnesium greater than 2.0  2. CAD s/p PCI: -No symptoms concerning for angina -Cath this admission  with nonobstructive disease and patent stents -On Eliquis place of aspirin -Continue carvedilol, and Lipitor  3. Chronic combined systolic and diastolic CHF: -He appears euvolemic and well compensated -Continue Entresto, carvedilol, spironolactone, and Lasix -Look to escalate GDMT throughout his admission and in hospital follow-up -Daily weights -Strict I's and O's  4. PAF: -Maintaining sinus rhythm -Continue amiodarone and Coreg as above -CHA2DS2-VASc 5 -Eliquis  5.  Delirium/psychosis: -Alert though not conversive -Unclear if this is a cerebral injury from anoxia given profound hypotension/VT this admission -Sitter at bedside -No family present  For questions or updates, please contact [facility] Please consult [email or web address] for contact info under Cardiology/STEMI.    Signed, Eula Listen, PA-C [facility] Pager: [phone] 02/08/2020, 7:40 AM

## 2020-02-08 NOTE — Plan of Care (Signed)
  Problem: Safety: Goal: Ability to remain free from injury will improve Outcome: Progressing   

## 2020-02-08 NOTE — Plan of Care (Signed)

## 2020-02-08 NOTE — Progress Notes (Signed)
PROGRESS NOTE    Travis Maxwell  OMB:[id number]  DOB: 1950/03/29  DOA: 01/06/2020 PCP: [facility] Outpatient Specialists:   Hospital course:  69 year old African-American male with past medical history of hypertension, diabetes mellitus type 2, hyperlipidemia, GERD, coronary artery disease status post PCI, history of aortic valve replacement on Eliquis presented to the hospital with respiratory distress, chest pain and shortness of breath on 01/06/2020.  He also had orthopnea and leg swelling.  Initially, patient was put on BiPAP but his breathing status worsened so he was emergently intubated and admitted to the hospital.  Patient was noted to be in severe cardiogenic shock on 01/07/2020.  Vasopressors were weaned off on 11/ 6/21 and patient was extubated to BiPAP at that time but patient was subsequently intubated on 01/19/2020.  Patient also had severe metabolic acidosis.  EKG showed some concerns for ST elevation but did not meet ST elevation MI criteria as per cardiology.  Patient received IV Cardizem and have a borderline elevated troponins.  BNP on admission was 1046.  Patient had a chest x-ray which showed vascular congestion.  Of note, patient has history of Covid pneumonia on 12/04/2019.  Patient remained extubated and was off pressors on 01/29/20 and was subsequently considered for transfer to medical service.  Patient has remained encephalopathic with a poor oral intake.  Patient was started on NG tube and tube feeding was started on 02/03/2020.  Has had episodes of agitation and confusion requiring sedation in 1-1 sitter.  Subjective:  Patient is much more awake and alert today.  He is trying to get out of bed.  Sitter is trying to get him to stay into bed.  He does not seem to want to listen.  Seems to be a bit agitated by my presence.  Denies any pain.  Objective: Vitals:   02/08/20 0622 02/08/20 0724 02/08/20 1132 02/08/20 1555  BP: 112/61 102/70 131/77  127/79  Pulse: 82 81 88 79  Resp: 17 19 18 19   Temp: 98.1 F (36.7 C) 97.8 F (36.6 C) 98.2 F (36.8 C) 98 F (36.7 C)  TempSrc: Axillary     SpO2: 100% 100% 99%   Weight: 64.9 kg     Height:        Intake/Output Summary (Last 24 hours) at 02/08/2020 1653 Last data filed at 02/08/2020 1200 Gross per 24 hour  Intake 501.6 ml  Output 650 ml  Net -148.4 ml   Filed Weights   02/05/20 0338 02/06/20 0408 02/08/20 0622  Weight: 66.7 kg 75.8 kg 64.9 kg     Exam:  General: Chronically ill-appearing man looking much older than stated age and to climb out of bed  PE deferred today as patient was not cooperative with exam.  Assessment & Plan:   Encephalopathy and delirium likely secondary to hypoxic encephalopathy and possible dementia Agitation has been a significant issue with the patient being aggressive at time Patient is more alert/less somnolent since change in Zyprexa dose Appreciate ongoing psychiatric input. Will follow EKG and QTC Continue one-on-one sitter  HFrEF Nonischemic cardiomyopathy, EF of 20 to 25% Cardiac catheterization 01/24/2020 with nonobstructive disease Continue diuresis, beta-blockers and Entresto  S/P episode of VT subsequent cardiogenic shock Status post cardioversion Presently on oral amiodarone 01/26/2020 is to keep potassium greater than 4 and magnesium greater than 2.  PAF On Coreg and amiodarone Continue Eliquis  S/P Pneumonia Patient is status post treatment for Pseudomonas and MRSA pneumonia per tracheal culture. Completed antibiotics  01/28/2020  Moderate protein calorie malnutrition Due to decreased oral intake Per patient's daughter, no tube feedings are warranted, she would like to have her father eat by himself. On dysphagia 1 diet  Stage II sacral decub Continue wound care   DVT prophylaxis: On Eliquis Code Status: Full Family Communication: SNF Disposition Plan:   Patient is from: Home  Anticipated Discharge Location: SNF   Barriers to Discharge: Placement, persistent encephalopathy with aggression and agitation  Is patient medically stable for Discharge: No   Consultants:  PCCM  Cardiology  Palliative care  Procedures:  Status post intubation and extubation  Cardiac catheterization 01/14/2020  NGT placement 02/02/2020    Data Reviewed:  Basic Metabolic Panel: Recent Labs  Lab 02/02/20 0526 02/02/20 1616 02/03/20 0551 02/03/20 1852 02/04/20 0331 02/04/20 1600 02/05/20 0349 02/05/20 1550 02/06/20 0406 02/07/20 0550 02/08/20 0533  NA 139  --  140  --  138  --  137  --  137 138 138  K 3.6  --  4.2  --  3.9  --  3.7  --  4.1 4.5 4.2  CL 107  --  109  --  106  --  104  --  104 104 103  CO2 22  --  21*  --  22  --  22  --  23 22 21*  GLUCOSE 121*  --  124*  --  144*  --  306*  --  121* 93 109*  BUN 13  --  13  --  11  --  14  --  14 17 18   CREATININE 0.80  --  0.89  --  0.87  --  0.79  --  0.96 0.94 0.94  CALCIUM 9.0  --  8.8*  --  9.0  --  8.2*  --  9.1 9.6 9.7  MG 1.6*   < > 1.9   < > 1.8   < > 1.5* 2.2 1.8 1.9 2.1  PHOS 3.8  --  3.4  --  2.9  --  3.5  --  4.1  --   --    < > = values in this interval not displayed.   Liver Function Tests: No results for input(s): AST, ALT, ALKPHOS, BILITOT, PROT, ALBUMIN in the last 168 hours. No results for input(s): LIPASE, AMYLASE in the last 168 hours. No results for input(s): AMMONIA in the last 168 hours. CBC: Recent Labs  Lab 02/04/20 0331 02/05/20 0349 02/06/20 0406 02/07/20 0550 02/08/20 0533  WBC 14.3* 11.1* 12.0* 10.9* 11.8*  NEUTROABS 8.0* 6.4 6.5 5.5 7.1  HGB 15.3 14.4 15.3 17.1* 16.1  HCT 50.1 45.8 51.0 54.6* 52.1*  MCV 70.1* 69.5* 71.2* 70.9* 70.3*  PLT 807* 655* 619* 693* 693*   Cardiac Enzymes: No results for input(s): CKTOTAL, CKMB, CKMBINDEX, TROPONINI in the last 168 hours. BNP (last 3 results) No results for input(s): PROBNP in the last 8760 hours. CBG: Recent Labs  Lab 02/07/20 2330 02/08/20 0418  02/08/20 0727 02/08/20 1134 02/08/20 1559  GLUCAP 146* 100* 115* 141* 114*    No results found for this or any previous visit (from the past 240 hour(s)).    Studies: No results found.   Scheduled Meds: . allopurinol  300 mg Oral Daily  . amiodarone  400 mg Oral BID  . apixaban  5 mg Oral BID  . atorvastatin  40 mg Oral QHS  . carvedilol  6.25 mg Oral BID WC  . chlorhexidine  15 mL Mouth  Rinse BID  . Chlorhexidine Gluconate Cloth  6 each Topical Daily  . docusate  100 mg Oral BID  . famotidine  20 mg Oral Daily  . feeding supplement  237 mL Oral TID BM  . furosemide  20 mg Oral Daily  . insulin aspart  0-9 Units Subcutaneous Q4H  . mouth rinse  15 mL Mouth Rinse q12n4p  . melatonin  5 mg Oral QHS  . multivitamin with minerals  1 tablet Oral Daily  . OLANZapine  10 mg Intramuscular Q8H  . polyethylene glycol  17 g Oral Daily  . potassium chloride  20 mEq Oral Daily  . sacubitril-valsartan  1 tablet Oral BID  . senna-docusate  1 tablet Oral BID  . sodium chloride flush  10-40 mL Intracatheter Q12H  . sodium chloride flush  3 mL Intravenous Q12H  . spironolactone  12.5 mg Oral Daily   Continuous Infusions: . sodium chloride Stopped (02/05/20 1043)    Principal Problem:   Delirium Active Problems:   Acute on chronic systolic congestive heart failure (HCC)   Respiratory failure (HCC)   Malnutrition of moderate degree   Adult failure to thrive   Palliative care by specialist   DNR (do not resuscitate) discussion   Pressure injury of skin   Ventricular tachycardia (HCC)     Afifa Truax Orma Flaming, [facility]  If 7PM-7AM, please contact night-coverage [email or web address] Password [email or web address] 02/08/2020, 4:53 PM    LOS: 33 days

## 2020-02-08 NOTE — Progress Notes (Signed)
PHARMACY CONSULT NOTE  Pharmacy Consult for Electrolyte Monitoring and Replacement   Recent Labs: Potassium (mmol/L)  Date Value  02/08/2020 4.2   Magnesium (mg/dL)  Date Value  65/46/5035 2.1   Calcium (mg/dL)  Date Value  46/56/8127 9.7   Albumin (g/dL)  Date Value  51/70/0174 2.6 (L)   Phosphorus (mg/dL)  Date Value  94/49/6759 4.1   Sodium (mmol/L)  Date Value  02/08/2020 138   Assessment: 69 year old male transferred to ICU for acute toxic metabolic encephalopathy. Overnight 11/10, patient with two runs pulseless VT, with second episode requiring CPR and defibrillation. Converted back into baseline bundle branch rhythm with rate in 50's-60's.  Similar episodes 11/11 requiring defibrillation. Pharmacy to manage electrolytes.  Nasogastric tube removed 12/1 secondary to clogging; family did not wish to have tube replaced. Pt seen by SLP and placed on a dysphagia 1/thin liquid diet. Patient oral intake increasing.   Diuretics: IV Lasix 20 mg daily + Aldactone 12.5 mg daily Other medications: entresto 24/26 mg BID   Goal of Therapy:  Potassium 4.0 - 5.1 mmol/L Magnesium 2.0 - 2.4 mg/dL All other electrolytes within normal limits  Plan:   Electrolytes stable  Follow-up electrolytes with AM labs  Pricilla Riffle, PharmD 02/08/2020 11:38 AM

## 2020-02-09 DIAGNOSIS — I5023 Acute on chronic systolic (congestive) heart failure: Secondary | ICD-10-CM | POA: Diagnosis not present

## 2020-02-09 DIAGNOSIS — I4891 Unspecified atrial fibrillation: Secondary | ICD-10-CM

## 2020-02-09 DIAGNOSIS — I472 Ventricular tachycardia: Secondary | ICD-10-CM | POA: Diagnosis not present

## 2020-02-09 LAB — CBC WITH DIFFERENTIAL/PLATELET
Abs Immature Granulocytes: 0.14 10*3/uL — ABNORMAL HIGH (ref 0.00–0.07)
Basophils Absolute: 0.2 10*3/uL — ABNORMAL HIGH (ref 0.0–0.1)
Basophils Relative: 2 %
Eosinophils Absolute: 1 10*3/uL — ABNORMAL HIGH (ref 0.0–0.5)
Eosinophils Relative: 8 %
HCT: 54.3 % — ABNORMAL HIGH (ref 39.0–52.0)
Hemoglobin: 16.7 g/dL (ref 13.0–17.0)
Immature Granulocytes: 1 %
Lymphocytes Relative: 14 %
Lymphs Abs: 1.7 10*3/uL (ref 0.7–4.0)
MCH: 21.6 pg — ABNORMAL LOW (ref 26.0–34.0)
MCHC: 30.8 g/dL (ref 30.0–36.0)
MCV: 70.3 fL — ABNORMAL LOW (ref 80.0–100.0)
Monocytes Absolute: 1.1 10*3/uL — ABNORMAL HIGH (ref 0.1–1.0)
Monocytes Relative: 9 %
Neutro Abs: 8.1 10*3/uL — ABNORMAL HIGH (ref 1.7–7.7)
Neutrophils Relative %: 66 %
Platelets: 611 10*3/uL — ABNORMAL HIGH (ref 150–400)
RBC: 7.72 MIL/uL — ABNORMAL HIGH (ref 4.22–5.81)
RDW: 28.7 % — ABNORMAL HIGH (ref 11.5–15.5)
WBC: 12.3 10*3/uL — ABNORMAL HIGH (ref 4.0–10.5)
nRBC: 0 % (ref 0.0–0.2)

## 2020-02-09 LAB — GLUCOSE, CAPILLARY
Glucose-Capillary: 131 mg/dL — ABNORMAL HIGH (ref 70–99)
Glucose-Capillary: 142 mg/dL — ABNORMAL HIGH (ref 70–99)
Glucose-Capillary: 143 mg/dL — ABNORMAL HIGH (ref 70–99)
Glucose-Capillary: 160 mg/dL — ABNORMAL HIGH (ref 70–99)
Glucose-Capillary: 167 mg/dL — ABNORMAL HIGH (ref 70–99)
Glucose-Capillary: 167 mg/dL — ABNORMAL HIGH (ref 70–99)
Glucose-Capillary: 169 mg/dL — ABNORMAL HIGH (ref 70–99)
Glucose-Capillary: 187 mg/dL — ABNORMAL HIGH (ref 70–99)

## 2020-02-09 LAB — PHOSPHORUS: Phosphorus: 4 mg/dL (ref 2.5–4.6)

## 2020-02-09 NOTE — Progress Notes (Signed)
PROGRESS NOTE    Travis Maxwell  ZOX:[id number]  DOB: 1950-06-13  DOA: 01/06/2020 PCP: [facility] Out[facility] Specialists:   Hospital course:  69 year old African-American male with past medical history of hypertension, diabetes mellitus type 2, hyperlipidemia, GERD, coronary artery disease status post PCI, history of aortic valve replacement on Eliquis presented to the hospital with respiratory distress, chest pain and shortness of breath on 01/06/2020.  He also had orthopnea and leg swelling.  Initially, patient was put on BiPAP but his breathing status worsened so he was emergently intubated and admitted to the hospital.  Patient was noted to be in severe cardiogenic shock on 01/07/2020.  Vasopressors were weaned off on 11/ 6/21 and patient was extubated to BiPAP at that time but patient was subsequently intubated on 01/19/2020.  Patient also had severe metabolic acidosis.  EKG showed some concerns for ST elevation but did not meet ST elevation MI criteria as per cardiology.  Patient received IV Cardizem and have a borderline elevated troponins.  BNP on admission was 1046.  Patient had a chest x-ray which showed vascular congestion.  Of note, patient has history of Covid pneumonia on 12/04/2019.  Patient remained extubated and was off pressors on 01/29/20 and was subsequently considered for transfer to medical service.  Patient has remained encephalopathic with a poor oral intake.  Patient was started on NG tube and tube feeding was started on 02/03/2020.  Has had episodes of agitation and confusion requiring sedation in 1-1 sitter.  Subjective:  Patient is calmer today.  He sitting up in bed and moving slowly.  Does not seem agitated or aggressive.  Objective: Vitals:   02/09/20 0431 02/09/20 0733 02/09/20 1141 02/09/20 1538  BP: 92/61 110/77 93/75 110/71  Pulse: 61 60 79 68  Resp:  17 16 16   Temp:  97.9 F (36.6 C) 97.8 F (36.6 C) 97.7 F (36.5 C)  TempSrc:    Oral Oral  SpO2:  99% 98% 100%  Weight: 62.1 kg     Height:        Intake/Output Summary (Last 24 hours) at 02/09/2020 1715 Last data filed at 02/09/2020 1140 Gross per 24 hour  Intake 160 ml  Output 875 ml  Net -715 ml   Filed Weights   02/06/20 0408 02/08/20 0622 02/09/20 0431  Weight: 75.8 kg 64.9 kg 62.1 kg     Exam:  General: Chronically ill-appearing man looking much older sitting up in bed staring at sitter somewhat lethargically Eyes: sclera anicteric, conjuctiva mild injection bilaterally CVS: S1-S2, regular  Respiratory:  decreased air entry bilaterally secondary to decreased inspiratory effort, rales at bases  GI: NABS, soft, NT  LE: No edema.   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 QTC remains prolonged at 593.  Cardiology is following. Continue one-on-one sitter  HFrEF Nonischemic cardiomyopathy, EF of 20 to 25% Cardiac catheterization 01/24/2020 with nonobstructive disease Continue diuresis, beta-blockers and Entresto  S/P episode of VT subsequent cardiogenic shock Status post cardioversion Presently on oral amiodarone 01/26/2020 is to keep potassium greater than 4 and magnesium greater than 2.  PAF On Coreg and amiodarone Continue Eliquis  S/P Pneumonia Patient is status post treatment for Pseudomonas and MRSA pneumonia per tracheal culture. Completed antibiotics 01/28/2020  Moderate protein calorie malnutrition Due to decreased oral intake Per patient's daughter, no tube feedings are warranted, she would like to have  her father eat by himself. On dysphagia 1 diet  Stage II sacral decub Continue wound care   DVT prophylaxis: On Eliquis Code Status: Full Family Communication: SNF Disposition Plan:   Patient is from: Home  Anticipated Discharge Location: SNF  Barriers to Discharge: Placement, persistent  encephalopathy with aggression and agitation  Is patient medically stable for Discharge: No   Consultants:  PCCM  Cardiology  Palliative care  Procedures:  Status post intubation and extubation  Cardiac catheterization 01/14/2020  NGT placement 02/02/2020    Data Reviewed:  Basic Metabolic Panel: Recent Labs  Lab 02/03/20 0551 02/03/20 1852 02/04/20 0331 02/04/20 1600 02/05/20 0349 02/05/20 1550 02/06/20 0406 02/07/20 0550 02/08/20 0533 02/09/20 0538  NA 140  --  138  --  137  --  137 138 138  --   K 4.2  --  3.9  --  3.7  --  4.1 4.5 4.2  --   CL 109  --  106  --  104  --  104 104 103  --   CO2 21*  --  22  --  22  --  23 22 21*  --   GLUCOSE 124*  --  144*  --  306*  --  121* 93 109*  --   BUN 13  --  11  --  14  --  14 17 18   --   CREATININE 0.89  --  0.87  --  0.79  --  0.96 0.94 0.94  --   CALCIUM 8.8*  --  9.0  --  8.2*  --  9.1 9.6 9.7  --   MG 1.9   < > 1.8   < > 1.5* 2.2 1.8 1.9 2.1  --   PHOS 3.4  --  2.9  --  3.5  --  4.1  --   --  4.0   < > = values in this interval not displayed.   Liver Function Tests: No results for input(s): AST, ALT, ALKPHOS, BILITOT, PROT, ALBUMIN in the last 168 hours. No results for input(s): LIPASE, AMYLASE in the last 168 hours. No results for input(s): AMMONIA in the last 168 hours. CBC: Recent Labs  Lab 02/05/20 0349 02/06/20 0406 02/07/20 0550 02/08/20 0533 02/09/20 0538  WBC 11.1* 12.0* 10.9* 11.8* 12.3*  NEUTROABS 6.4 6.5 5.5 7.1 8.1*  HGB 14.4 15.3 17.1* 16.1 16.7  HCT 45.8 51.0 54.6* 52.1* 54.3*  MCV 69.5* 71.2* 70.9* 70.3* 70.3*  PLT 655* 619* 693* 693* 611*   Cardiac Enzymes: No results for input(s): CKTOTAL, CKMB, CKMBINDEX, TROPONINI in the last 168 hours. BNP (last 3 results) No results for input(s): PROBNP in the last 8760 hours. CBG: Recent Labs  Lab 02/09/20 0017 02/09/20 0420 02/09/20 0738 02/09/20 1140 02/09/20 1632  GLUCAP 167* 131* 142* 169* 167*    No results found for this or  any previous visit (from the past 240 hour(s)).    Studies: No results found.   Scheduled Meds: . allopurinol  300 mg Oral Daily  . amiodarone  400 mg Oral BID  . apixaban  5 mg Oral BID  . atorvastatin  40 mg Oral QHS  . carvedilol  6.25 mg Oral BID WC  . chlorhexidine  15 mL Mouth Rinse BID  . Chlorhexidine Gluconate Cloth  6 each Topical Daily  . docusate  100 mg Oral BID  . famotidine  20 mg Oral Daily  . feeding supplement  237 mL Oral TID BM  .  furosemide  20 mg Oral Daily  . insulin aspart  0-9 Units Subcutaneous Q4H  . mouth rinse  15 mL Mouth Rinse q12n4p  . melatonin  5 mg Oral QHS  . multivitamin with minerals  1 tablet Oral Daily  . OLANZapine  10 mg Intramuscular Q8H  . polyethylene glycol  17 g Oral Daily  . potassium chloride  20 mEq Oral Daily  . sacubitril-valsartan  1 tablet Oral BID  . senna-docusate  1 tablet Oral BID  . sodium chloride flush  10-40 mL Intracatheter Q12H  . sodium chloride flush  3 mL Intravenous Q12H  . spironolactone  12.5 mg Oral Daily   Continuous Infusions: . sodium chloride Stopped (02/05/20 1043)    Principal Problem:   Delirium Active Problems:   Acute on chronic systolic congestive heart failure (HCC)   Respiratory failure (HCC)   Malnutrition of moderate degree   Adult failure to thrive   Palliative care by specialist   DNR (do not resuscitate) discussion   Pressure injury of skin   Ventricular tachycardia (HCC)   Atrial fibrillation with RVR (HCC)     Travis Maxwell Orma Flaming, [facility]  If 7PM-7AM, please contact night-coverage [email or web address] Password [email or web address] 02/09/2020, 5:15 PM    LOS: 34 days

## 2020-02-09 NOTE — TOC Progression Note (Signed)
Transition of Care [facility]) - Progression Note    Patient Details  Name: Travis Maxwell MRN: [id number] Date of Birth: [birth year]  [facility] CM/SW Contact  [name], RN Phone Number: 02/09/2020, 5:46 PM  Clinical Narrative:  12/5 1747 12/5 Per provider notes:   Anticipated Discharge Location: SNF  Barriers to Discharge: Placement, persistent encephalopathy with aggression and agitation  Is patient medically stable for Discharge: No [name] RN CM    Expected Discharge Plan: Skilled Nursing Facility Barriers to Discharge: Continued Medical Work up, Other (comment) (Patient continues to be medically unstable.)  Expected Discharge Plan and Services Expected Discharge Plan: Skilled Nursing Facility In-house Referral: Clinical Social Work   Post Acute Care Choice: Skilled Nursing Facility Living arrangements for the past 2 months: Single Family Home                                       Social Determinants of Health (SDOH) Interventions    Readmission Risk Interventions No flowsheet data found.

## 2020-02-09 NOTE — Plan of Care (Signed)
°  Problem: Clinical Measurements: °Goal: Respiratory complications will improve °Outcome: Progressing °  °Problem: Pain Managment: °Goal: General experience of comfort will improve °Outcome: Progressing °  °Problem: Safety: °Goal: Ability to remain free from injury will improve °Outcome: Progressing °  °

## 2020-02-09 NOTE — Progress Notes (Signed)
Progress Note  Patient Name: Travis Maxwell Date of Encounter: 02/09/2020  [facility] Cardiologist: Lorine Bears, MD   Subjective   Sleeping comfortably, was able to eat all of his breakfast.  Per nursing aide, still agitated but slightly improved compared to yesterday.  Inpatient Medications    Scheduled Meds: . allopurinol  300 mg Oral Daily  . amiodarone  400 mg Oral BID  . apixaban  5 mg Oral BID  . atorvastatin  40 mg Oral QHS  . carvedilol  6.25 mg Oral BID WC  . chlorhexidine  15 mL Mouth Rinse BID  . Chlorhexidine Gluconate Cloth  6 each Topical Daily  . docusate  100 mg Oral BID  . famotidine  20 mg Oral Daily  . feeding supplement  237 mL Oral TID BM  . furosemide  20 mg Oral Daily  . insulin aspart  0-9 Units Subcutaneous Q4H  . mouth rinse  15 mL Mouth Rinse q12n4p  . melatonin  5 mg Oral QHS  . multivitamin with minerals  1 tablet Oral Daily  . OLANZapine  10 mg Intramuscular Q8H  . polyethylene glycol  17 g Oral Daily  . potassium chloride  20 mEq Oral Daily  . sacubitril-valsartan  1 tablet Oral BID  . senna-docusate  1 tablet Oral BID  . sodium chloride flush  10-40 mL Intracatheter Q12H  . sodium chloride flush  3 mL Intravenous Q12H  . spironolactone  12.5 mg Oral Daily   Continuous Infusions: . sodium chloride Stopped (02/05/20 1043)   PRN Meds: sodium chloride, acetaminophen, albuterol, bisacodyl, metoprolol tartrate, ondansetron (ZOFRAN) IV, polyethylene glycol   Vital Signs    Vitals:   02/09/20 0415 02/09/20 0431 02/09/20 0733 02/09/20 1141  BP: (!) 88/72 92/61 110/77 93/75  Pulse: 61 61 60 79  Resp: 18  17 16   Temp: (!) 97.5 F (36.4 C)  97.9 F (36.6 C) 97.8 F (36.6 C)  TempSrc:    Oral  SpO2: 100%  99% 98%  Weight:  62.1 kg    Height:        Intake/Output Summary (Last 24 hours) at 02/09/2020 1239 Last data filed at 02/09/2020 0800 Gross per 24 hour  Intake 160 ml  Output 800 ml  Net -640 ml   Last 3 Weights  02/09/2020 02/08/2020 02/06/2020  Weight (lbs) 137 lb 143 lb 167 lb  Weight (kg) 62.143 kg 64.864 kg 75.751 kg      Telemetry    Sinus rhythm- Personally Reviewed  ECG    Sinus rhythm- Personally Reviewed  Physical Exam   GEN:  Sleepy Neck: No JVD Cardiac: RRR, Respiratory:  Basal crackles GI: Soft, nontender, non-distended  MS: No edema; No deformity. Neuro:  somnolent Psych: somnolent  Labs    High Sensitivity Troponin:   Recent Labs  Lab 01/16/20 0245 01/16/20 0702 01/16/20 0838 01/19/20 0433  TROPONINIHS 934* 711* 674* 234*      Chemistry Recent Labs  Lab 02/06/20 0406 02/07/20 0550 02/08/20 0533  NA 137 138 138  K 4.1 4.5 4.2  CL 104 104 103  CO2 23 22 21*  GLUCOSE 121* 93 109*  BUN 14 17 18   CREATININE 0.96 0.94 0.94  CALCIUM 9.1 9.6 9.7  GFRNONAA >60 >60 >60  ANIONGAP 10 12 14      Hematology Recent Labs  Lab 02/07/20 0550 02/08/20 0533 02/09/20 0538  WBC 10.9* 11.8* 12.3*  RBC 7.70* 7.41* 7.72*  HGB 17.1* 16.1 16.7  HCT 54.6* 52.1*  54.3*  MCV 70.9* 70.3* 70.3*  MCH 22.2* 21.7* 21.6*  MCHC 31.3 30.9 30.8  RDW 28.6* 28.5* 28.7*  PLT 693* 693* 611*    BNPNo results for input(s): BNP, PROBNP in the last 168 hours.   DDimer No results for input(s): DDIMER in the last 168 hours.   Radiology    No results found.  Cardiac Studies   Echo 01/21/20: 1. Left ventricular ejection fraction, by estimation, is 20 to 25%. The  left ventricle has severely decreased function. The left ventricle  demonstrates regional wall motion abnormalities (see scoring  diagram/findings for description). The left  ventricular internal cavity size was moderately dilated. There is mild  left ventricular hypertrophy. Left ventricular diastolic parameters are  consistent with Grade I diastolic dysfunction (impaired relaxation).  Elevated left atrial pressure. There is  severe global hypokinesis with relative sparing of the basal segments.  2. Right  ventricular systolic function is moderately reduced. The right  ventricular size is mildly enlarged. Tricuspid regurgitation signal is  inadequate for assessing PA pressure.  3. Left atrial size was mildly dilated.  4. The mitral valve is grossly normal. Mild mitral valve regurgitation.  No evidence of mitral stenosis.  5. The aortic valve was not well visualized. Aortic valve regurgitation  is not visualized. Echo findings are consistent with normal structure and  function of the aortic valve prosthesis.  6. Pulmonic valve regurgitation not well assessed.  LHC 01/24/20:  Previously placed Prox RCA to Mid RCA stent (unknown type) is widely patent.  Prox RCA lesion is 50% stenosed.  1. Patent stent in a nondominant right coronary artery. No significant coronary artery disease overall. 2. The replaced aortic valve was not crossed. 3. Right heart catheterization showed mildly elevated filling pressures, minimal pulmonary hypertension and normal cardiac output  Patient Profile     69 y.o. male with history of CAD/PCI to RCA in 2019, diabetes, tobacco use, paroxysmal atrial fibrillation, NICM EF 20 to 25% who presents with shortness of breath, diagnosed with MRSA and Pseudomonas pneumonia. Hospital course complicated by persistent VT.   Assessment & Plan    1. VT -Resolved on amiodarone.  -Oral amiodarone 400 mg twice daily -Coreg  2. Paroxysmal atrial fibrillation -Currently in sinus rhythm -Continue amiodarone, Coreg -Eliquis  3. NICM EF 20-25% -Euvolemic -Oral Lasix 20 mg daily -Continue Coreg, Entresto,   4. CAD/PCI to RCA -Aspirin, Lipitor.  5. Pneumonia/agitation, -abx, physical therapy, managementas per primary team   Total encounter time 25 minutes  Greater than 50% was spent in counseling and coordination of care with the patient  Signed, Debbe Odea, MD  02/09/2020, 12:39 PM

## 2020-02-10 DIAGNOSIS — I5023 Acute on chronic systolic (congestive) heart failure: Secondary | ICD-10-CM | POA: Diagnosis not present

## 2020-02-10 DIAGNOSIS — I4891 Unspecified atrial fibrillation: Secondary | ICD-10-CM | POA: Diagnosis not present

## 2020-02-10 LAB — CBC WITH DIFFERENTIAL/PLATELET
Abs Immature Granulocytes: 0.24 10*3/uL — ABNORMAL HIGH (ref 0.00–0.07)
Basophils Absolute: 0.3 10*3/uL — ABNORMAL HIGH (ref 0.0–0.1)
Basophils Relative: 2 %
Eosinophils Absolute: 1 10*3/uL — ABNORMAL HIGH (ref 0.0–0.5)
Eosinophils Relative: 7 %
HCT: 53.3 % — ABNORMAL HIGH (ref 39.0–52.0)
Hemoglobin: 16.5 g/dL (ref 13.0–17.0)
Immature Granulocytes: 2 %
Lymphocytes Relative: 18 %
Lymphs Abs: 2.7 10*3/uL (ref 0.7–4.0)
MCH: 22 pg — ABNORMAL LOW (ref 26.0–34.0)
MCHC: 31 g/dL (ref 30.0–36.0)
MCV: 71.2 fL — ABNORMAL LOW (ref 80.0–100.0)
Monocytes Absolute: 1.4 10*3/uL — ABNORMAL HIGH (ref 0.1–1.0)
Monocytes Relative: 9 %
Neutro Abs: 9.8 10*3/uL — ABNORMAL HIGH (ref 1.7–7.7)
Neutrophils Relative %: 62 %
Platelets: 582 10*3/uL — ABNORMAL HIGH (ref 150–400)
RBC: 7.49 MIL/uL — ABNORMAL HIGH (ref 4.22–5.81)
RDW: 28.4 % — ABNORMAL HIGH (ref 11.5–15.5)
WBC: 15.5 10*3/uL — ABNORMAL HIGH (ref 4.0–10.5)
nRBC: 0 % (ref 0.0–0.2)

## 2020-02-10 LAB — GLUCOSE, CAPILLARY
Glucose-Capillary: 140 mg/dL — ABNORMAL HIGH (ref 70–99)
Glucose-Capillary: 148 mg/dL — ABNORMAL HIGH (ref 70–99)
Glucose-Capillary: 142 mg/dL — ABNORMAL HIGH (ref 70–99)
Glucose-Capillary: 81 mg/dL (ref 70–99)
Glucose-Capillary: 144 mg/dL — ABNORMAL HIGH (ref 70–99)
Glucose-Capillary: 157 mg/dL — ABNORMAL HIGH (ref 70–99)

## 2020-02-10 MED ORDER — OLANZAPINE 10 MG PO TBDP
10.0000 mg | ORAL_TABLET | Freq: Three times a day (TID) | ORAL | Status: DC
  Administered 2020-02-10 – 2020-02-11 (×5): 10 mg via ORAL
  Filled 2020-02-10 (×7): qty 1

## 2020-02-10 MED ORDER — AMIODARONE HCL 200 MG PO TABS
200.0000 mg | ORAL_TABLET | Freq: Two times a day (BID) | ORAL | Status: DC
  Administered 2020-02-11 – 2020-02-20 (×18): 200 mg via ORAL
  Filled 2020-02-10 (×19): qty 1

## 2020-02-10 NOTE — Progress Notes (Signed)
PROGRESS NOTE    Travis Maxwell  DXA:[id number]  DOB: 04-May-1950  DOA: 01/06/2020 [facility]CP: [facility] Outpatient Specialists:   Hospital course:  69 year old African-American male with past medical history of hypertension, diabetes mellitus type 2, hyperlipidemia, GERD, coronary artery disease status post PCI, history of aortic valve replacement on Eliquis presented to the hospital with respiratory distress, chest pain and shortness of breath on 01/06/2020.  He also had orthopnea and leg swelling.  Initially, patient was put on BiPAP but his breathing status worsened so he was emergently intubated and admitted to the hospital.  Patient was noted to be in severe cardiogenic shock on 01/07/2020.  Vasopressors were weaned off on 11/ 6/21 and patient was extubated to BiPAP at that time but patient was subsequently intubated on 01/19/2020.  Patient also had severe metabolic acidosis.  EKG showed some concerns for ST elevation but did not meet ST elevation MI criteria as per cardiology.  Patient received IV Cardizem and have a borderline elevated troponins.  BNP on admission was 1046.  Patient had a chest x-ray which showed vascular congestion.  Of note, patient has history of Covid pneumonia on 12/04/2019.  Patient remained extubated and was off pressors on 01/29/20 and was subsequently considered for transfer to medical service.  Patient has remained encephalopathic with a poor oral intake.  Patient was started on NG tube and tube feeding was started on 02/03/2020.  Has had episodes of agitation and confusion requiring sedation in 1-1 sitter.  Subjective:  Patient is sleeping soundly after a morning of intermittent agitation. He is not awakened.  Objective: Vitals:   02/10/20 0434 02/10/20 0756 02/10/20 1138 02/10/20 1522  BP:  103/64 104/61 102/62  Pulse:  69 68 61  Resp:  15 16 16   Temp:  97.9 F (36.6 C) 97.7 F (36.5 C) 97.9 F (36.6 C)  TempSrc:  Oral  Oral  SpO2:   97% 98% 98%  Weight: 62.6 kg     Height:        Intake/Output Summary (Last 24 hours) at 02/10/2020 1834 Last data filed at 02/10/2020 1753 Gross per 24 hour  Intake --  Output 1150 ml  Net -1150 ml   Filed Weights   02/08/20 0622 02/09/20 0431 02/10/20 0434  Weight: 64.9 kg 62.1 kg 62.6 kg   Exam:  Constitutional:  . The patient is sleeping soundly. He is not awakened. No acute distress. Respiratory:  . No increased work of breathing. . No wheezes, rales, or rhonchi . No tactile fremitus Cardiovascular:  . Regular rate and rhythm . No murmurs, ectopy, or gallups. . No lateral PMI. No thrills. Abdomen:  . Abdomen is soft, non-tender, non-distended . No hernias, masses, or organomegaly . Normoactive bowel sounds.  Musculoskeletal:  . No cyanosis, clubbing, or edema Skin:  . No rashes, lesions, ulcers . palpation of skin: no induration or nodules Neurologic:  . Unable to evaluate as the patient is unable to cooperate with exam. Psychiatric:  Unable to evaluate as the patient is unable to cooperate with exam.   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 QTC remains prolonged at 593.  Cardiology is following. Continue one-on-one sitter  HFrEF Nonischemic cardiomyopathy, EF of 20 to 25% Cardiac catheterization 01/24/2020 with nonobstructive disease Continue diuresis, beta-blockers and Entresto  S/P episode of VT subsequent cardiogenic shock Status post cardioversion Presently on oral  amiodarone Daryl Eastern is to keep potassium greater than 4 and magnesium greater than 2.  PAF On Coreg and amiodarone Continue Eliquis  S/P Pneumonia Patient is status post treatment for Pseudomonas and MRSA pneumonia per tracheal culture. Completed antibiotics 01/28/2020  Moderate protein calorie malnutrition Due to decreased oral intake Per  patient's daughter, no tube feedings are warranted, she would like to have her father eat by himself. On dysphagia 1 diet  Stage II sacral decub Continue wound care   DVT prophylaxis: On Eliquis Code Status: Full Family Communication: SNF Disposition Plan:   Patient is from: Home  Anticipated Discharge Location: SNF  Barriers to Discharge: Placement, persistent encephalopathy with aggression and agitation  Is patient medically stable for Discharge: No   Consultants:  PCCM  Cardiology  Palliative care  Procedures:  Status post intubation and extubation  Cardiac catheterization 01/14/2020  NGT placement 02/02/2020    Data Reviewed:  Basic Metabolic Panel: Recent Labs  Lab 02/04/20 0331 02/04/20 1600 02/05/20 0349 02/05/20 1550 02/06/20 0406 02/07/20 0550 02/08/20 0533 02/09/20 0538  NA 138  --  137  --  137 138 138  --   K 3.9  --  3.7  --  4.1 4.5 4.2  --   CL 106  --  104  --  104 104 103  --   CO2 22  --  22  --  23 22 21*  --   GLUCOSE 144*  --  306*  --  121* 93 109*  --   BUN 11  --  14  --  14 17 18   --   CREATININE 0.87  --  0.79  --  0.96 0.94 0.94  --   CALCIUM 9.0  --  8.2*  --  9.1 9.6 9.7  --   MG 1.8   < > 1.5* 2.2 1.8 1.9 2.1  --   PHOS 2.9  --  3.5  --  4.1  --   --  4.0   < > = values in this interval not displayed.   Liver Function Tests: No results for input(s): AST, ALT, ALKPHOS, BILITOT, PROT, ALBUMIN in the last 168 hours. No results for input(s): LIPASE, AMYLASE in the last 168 hours. No results for input(s): AMMONIA in the last 168 hours. CBC: Recent Labs  Lab 02/06/20 0406 02/07/20 0550 02/08/20 0533 02/09/20 0538 02/10/20 0527  WBC 12.0* 10.9* 11.8* 12.3* 15.5*  NEUTROABS 6.5 5.5 7.1 8.1* 9.8*  HGB 15.3 17.1* 16.1 16.7 16.5  HCT 51.0 54.6* 52.1* 54.3* 53.3*  MCV 71.2* 70.9* 70.3* 70.3* 71.2*  PLT 619* 693* 693* 611* 582*   Cardiac Enzymes: No results for input(s): CKTOTAL, CKMB, CKMBINDEX, TROPONINI in the last 168  hours. BNP (last 3 results) No results for input(s): PROBNP in the last 8760 hours. CBG: Recent Labs  Lab 02/09/20 2355 02/10/20 0418 02/10/20 0732 02/10/20 1141 02/10/20 1635  GLUCAP 160* 140* 148* 142* 81    No results found for this or any previous visit (from the past 240 hour(s)).    Studies: No results found.   Scheduled Meds: . allopurinol  300 mg Oral Daily  . amiodarone  200 mg Oral BID  . apixaban  5 mg Oral BID  . atorvastatin  40 mg Oral QHS  . carvedilol  6.25 mg Oral BID WC  . chlorhexidine  15 mL Mouth Rinse BID  . Chlorhexidine Gluconate Cloth  6 each Topical Daily  . docusate  100 mg Oral BID  .  famotidine  20 mg Oral Daily  . feeding supplement  237 mL Oral TID BM  . furosemide  20 mg Oral Daily  . insulin aspart  0-9 Units Subcutaneous Q4H  . mouth rinse  15 mL Mouth Rinse q12n4p  . melatonin  5 mg Oral QHS  . multivitamin with minerals  1 tablet Oral Daily  . OLANZapine zydis  10 mg Oral TID  . polyethylene glycol  17 g Oral Daily  . potassium chloride  20 mEq Oral Daily  . sacubitril-valsartan  1 tablet Oral BID  . senna-docusate  1 tablet Oral BID  . sodium chloride flush  10-40 mL Intracatheter Q12H  . sodium chloride flush  3 mL Intravenous Q12H  . spironolactone  12.5 mg Oral Daily   Continuous Infusions: . sodium chloride Stopped (02/05/20 1043)    Principal Problem:   Delirium Active Problems:   Acute on chronic systolic congestive heart failure (HCC)   Respiratory failure (HCC)   Malnutrition of moderate degree   Adult failure to thrive   Palliative care by specialist   DNR (do not resuscitate) discussion   Pressure injury of skin   Ventricular tachycardia (HCC)   Atrial fibrillation with RVR (HCC)   Shadara Lopez, [facility]  If 7PM-7AM, please contact night-coverage [email or web address] Password [facility] 02/10/2020, 6:34 PM    LOS: 35 days

## 2020-02-10 NOTE — Consult Note (Signed)
[facility], [place] Face-to-Face Psychiatry Consult   Reason for Consult: Follow-up for this 69 year old man with recent delirium due to multiple medical problems Referring Physician:  Swayze Patient Identification: Travis Maxwell MRN:  [id number] Principal Diagnosis: Delirium Diagnosis:  Principal Problem:   Delirium Active Problems:   Acute on chronic systolic congestive heart failure (HCC)   Respiratory failure (HCC)   Malnutrition of moderate degree   Adult failure to thrive   Palliative care by specialist   DNR (do not resuscitate) discussion   Pressure injury of skin   Ventricular tachycardia (HCC)   Atrial fibrillation with RVR (HCC)   Total Time spent with patient: 30 minutes  Subjective:   Travis Maxwell is a 69 y.o. male patient admitted with patient has no communication.  HPI: Follow-up for this patient with delirium.  Last time I saw him he was very agitated would not respond verbally at all trying to crawl out of bed requiring mitts all the time.  Today the patient was awake and alert.  There was a woman friend in the room with him and she tells me she is communicating with him successfully.  Patient was able to answer some questions although I still could not make out much of what he said and he still seemed to be confused about his situation.  We changed the olanzapine from IV to oral dissolving tablets  Past Psychiatric History: No known past psychiatric history  Risk to Self:   Risk to Others:   Prior Inpatient Therapy:   Prior Outpatient Therapy:    Past Medical History:  Past Medical History:  Diagnosis Date  . CAD (coronary artery disease)   . Chronic systolic (congestive) heart failure (HCC)   . Diabetes mellitus without complication (HCC)   . GERD (gastroesophageal reflux disease)   . Gout   . Hx of aortic valve replacement    Bioprosthetic valve  . Hypertension     Past Surgical History:  Procedure Laterality Date  . CORONARY STENT INTERVENTION N/A 07/25/2017    Procedure: CORONARY STENT INTERVENTION;  Surgeon: Alwyn Pea, MD;  Location: [facility] INVASIVE CV LAB;  Service: Cardiovascular;  Laterality: N/A;  . LEFT HEART CATH AND CORONARY ANGIOGRAPHY N/A 07/25/2017   Procedure: LEFT HEART CATH AND CORONARY ANGIOGRAPHY;  Surgeon: Lamar Blinks, MD;  Location: [facility] INVASIVE CV LAB;  Service: Cardiovascular;  Laterality: N/A;  . RIGHT/LEFT HEART CATH AND CORONARY ANGIOGRAPHY N/A 01/24/2020   Procedure: RIGHT/LEFT HEART CATH AND CORONARY ANGIOGRAPHY;  Surgeon: Iran Ouch, MD;  Location: [facility] INVASIVE CV LAB;  Service: Cardiovascular;  Laterality: N/A;   Family History:  Family History  Problem Relation Age of Onset  . CAD Mother   . CAD Father    Family Psychiatric  History: See previous Social History:  Social History   Substance and Sexual Activity  Alcohol Use Not Currently     Social History   Substance and Sexual Activity  Drug Use Yes  . Types: Marijuana   Comment: today    Social History   Socioeconomic History  . Marital status: Divorced    Spouse name: Not on file  . Number of children: Not on file  . Years of education: Not on file  . Highest education level: Not on file  Occupational History  . Not on file  Tobacco Use  . Smoking status: Current Every Day Smoker    Packs/day: 0.50    Types: Cigarettes    Last attempt to quit: 08/10/2017  Years since quitting: 2.5  . Smokeless tobacco: Never Used  Vaping Use  . Vaping Use: Never used  Substance and Sexual Activity  . Alcohol use: Not Currently  . Drug use: Yes    Types: Marijuana    Comment: today  . Sexual activity: Not on file  Other Topics Concern  . Not on file  Social History Narrative  . Not on file   Social Determinants of Health   Financial Resource Strain:   . Difficulty of Paying Living Expenses: Not on file  Food Insecurity:   . Worried About Programme researcher, broadcasting/film/video in the Last Year: Not on file  . Ran Out of Food in the Last Year: Not  on file  Transportation Needs:   . Lack of Transportation (Medical): Not on file  . Lack of Transportation (Non-Medical): Not on file  Physical Activity:   . Days of Exercise per Week: Not on file  . Minutes of Exercise per Session: Not on file  Stress:   . Feeling of Stress : Not on file  Social Connections:   . Frequency of Communication with Friends and Family: Not on file  . Frequency of Social Gatherings with Friends and Family: Not on file  . Attends Religious Services: Not on file  . Active Member of Clubs or Organizations: Not on file  . Attends Banker Meetings: Not on file  . Marital Status: Not on file   Additional Social History:    Allergies:   Allergies  Allergen Reactions  . Penicillins Other (See Comments)    Has patient had a PCN reaction causing immediate rash, facial/tongue/throat swelling, SOB or lightheadedness with hypotension: Unknown Has patient had a PCN reaction causing severe rash involving mucus membranes or skin necrosis: Unknown Has patient had a PCN reaction that required hospitalization: Unknown Has patient had a PCN reaction occurring within the last 10 years: No If all of the above answers are "NO", then may proceed with Cephalosporin use.     Labs:  Results for orders placed or performed during the hospital encounter of 01/06/20 (from the past 48 hour(s))  Glucose, capillary     Status: Abnormal   Collection Time: 02/08/20  7:38 PM  Result Value Ref Range   Glucose-Capillary 162 (H) 70 - 99 mg/dL    Comment: Glucose reference range applies only to samples taken after fasting for at least 8 hours.  Glucose, capillary     Status: Abnormal   Collection Time: 02/09/20 12:17 AM  Result Value Ref Range   Glucose-Capillary 167 (H) 70 - 99 mg/dL    Comment: Glucose reference range applies only to samples taken after fasting for at least 8 hours.  Glucose, capillary     Status: Abnormal   Collection Time: 02/09/20  4:20 AM  Result  Value Ref Range   Glucose-Capillary 131 (H) 70 - 99 mg/dL    Comment: Glucose reference range applies only to samples taken after fasting for at least 8 hours.  Phosphorus     Status: None   Collection Time: 02/09/20  5:38 AM  Result Value Ref Range   Phosphorus 4.0 2.5 - 4.6 mg/dL    Comment: Performed at [facility], [address] [place], [place] [address]  CBC with Differential/Platelet     Status: Abnormal   Collection Time: 02/09/20  5:38 AM  Result Value Ref Range   WBC 12.3 (H) 4.0 - 10.5 K/uL   RBC 7.72 (H) 4.22 - 5.81  MIL/uL   Hemoglobin 16.7 13.0 - 17.0 g/dL   HCT 16.1 (H) 39 - 52 %   MCV 70.3 (L) 80.0 - 100.0 fL   MCH 21.6 (L) 26.0 - 34.0 pg   MCHC 30.8 30.0 - 36.0 g/dL   RDW 09.6 (H) 04.5 - 40.9 %   Platelets 611 (H) 150 - 400 K/uL   nRBC 0.0 0.0 - 0.2 %   Neutrophils Relative % 66 %   Neutro Abs 8.1 (H) 1.7 - 7.7 K/uL   Lymphocytes Relative 14 %   Lymphs Abs 1.7 0.7 - 4.0 K/uL   Monocytes Relative 9 %   Monocytes Absolute 1.1 (H) 0.1 - 1.0 K/uL   Eosinophils Relative 8 %   Eosinophils Absolute 1.0 (H) 0.0 - 0.5 K/uL   Basophils Relative 2 %   Basophils Absolute 0.2 (H) 0.0 - 0.1 K/uL   Immature Granulocytes 1 %   Abs Immature Granulocytes 0.14 (H) 0.00 - 0.07 K/uL    Comment: Performed at [facility], [address] [place], [place] [address]  Glucose, capillary     Status: Abnormal   Collection Time: 02/09/20  7:38 AM  Result Value Ref Range   Glucose-Capillary 142 (H) 70 - 99 mg/dL    Comment: Glucose reference range applies only to samples taken after fasting for at least 8 hours.  Glucose, capillary     Status: Abnormal   Collection Time: 02/09/20 11:40 AM  Result Value Ref Range   Glucose-Capillary 169 (H) 70 - 99 mg/dL    Comment: Glucose reference range applies only to samples taken after fasting for at least 8 hours.  Glucose, capillary     Status: Abnormal   Collection Time: 02/09/20  4:32 PM  Result Value Ref Range    Glucose-Capillary 167 (H) 70 - 99 mg/dL    Comment: Glucose reference range applies only to samples taken after fasting for at least 8 hours.  Glucose, capillary     Status: Abnormal   Collection Time: 02/09/20  7:20 PM  Result Value Ref Range   Glucose-Capillary 187 (H) 70 - 99 mg/dL    Comment: Glucose reference range applies only to samples taken after fasting for at least 8 hours.  Glucose, capillary     Status: Abnormal   Collection Time: 02/09/20  8:52 PM  Result Value Ref Range   Glucose-Capillary 143 (H) 70 - 99 mg/dL    Comment: Glucose reference range applies only to samples taken after fasting for at least 8 hours.  Glucose, capillary     Status: Abnormal   Collection Time: 02/09/20 11:55 PM  Result Value Ref Range   Glucose-Capillary 160 (H) 70 - 99 mg/dL    Comment: Glucose reference range applies only to samples taken after fasting for at least 8 hours.  Glucose, capillary     Status: Abnormal   Collection Time: 02/10/20  4:18 AM  Result Value Ref Range   Glucose-Capillary 140 (H) 70 - 99 mg/dL    Comment: Glucose reference range applies only to samples taken after fasting for at least 8 hours.  CBC with Differential/Platelet     Status: Abnormal   Collection Time: 02/10/20  5:27 AM  Result Value Ref Range   WBC 15.5 (H) 4.0 - 10.5 K/uL   RBC 7.49 (H) 4.22 - 5.81 MIL/uL   Hemoglobin 16.5 13.0 - 17.0 g/dL   HCT 47.8 (H) 39 - 52 %   MCV 71.2 (L) 80.0 - 100.0 fL  MCH 22.0 (L) 26.0 - 34.0 pg   MCHC 31.0 30.0 - 36.0 g/dL   RDW 59.1 (H) 63.8 - 46.6 %   Platelets 582 (H) 150 - 400 K/uL   nRBC 0.0 0.0 - 0.2 %   Neutrophils Relative % 62 %   Neutro Abs 9.8 (H) 1.7 - 7.7 K/uL   Lymphocytes Relative 18 %   Lymphs Abs 2.7 0.7 - 4.0 K/uL   Monocytes Relative 9 %   Monocytes Absolute 1.4 (H) 0.1 - 1.0 K/uL   Eosinophils Relative 7 %   Eosinophils Absolute 1.0 (H) 0.0 - 0.5 K/uL   Basophils Relative 2 %   Basophils Absolute 0.3 (H) 0.0 - 0.1 K/uL   WBC Morphology  MORPHOLOGY UNREMARKABLE    RBC Morphology See Note     Comment: MIXED RBC POPULATION   Immature Granulocytes 2 %   Abs Immature Granulocytes 0.24 (H) 0.00 - 0.07 K/uL   Giant PLTs PRESENT     Comment: Performed at [facility], [address] [place], [place] [address]  Glucose, capillary     Status: Abnormal   Collection Time: 02/10/20  7:32 AM  Result Value Ref Range   Glucose-Capillary 148 (H) 70 - 99 mg/dL    Comment: Glucose reference range applies only to samples taken after fasting for at least 8 hours.  Glucose, capillary     Status: Abnormal   Collection Time: 02/10/20 11:41 AM  Result Value Ref Range   Glucose-Capillary 142 (H) 70 - 99 mg/dL    Comment: Glucose reference range applies only to samples taken after fasting for at least 8 hours.  Glucose, capillary     Status: None   Collection Time: 02/10/20  4:35 PM  Result Value Ref Range   Glucose-Capillary 81 70 - 99 mg/dL    Comment: Glucose reference range applies only to samples taken after fasting for at least 8 hours.    Current Facility-Administered Medications  Medication Dose Route Frequency Provider Last Rate Last Admin  . 0.9 %  sodium chloride infusion   Intravenous PRN Vida Rigger, MD   Stopped at 02/05/20 1043  . acetaminophen (TYLENOL) tablet 650 mg  650 mg Oral Q4H PRN Erin Fulling, MD      . albuterol (VENTOLIN HFA) 108 (90 Base) MCG/ACT inhaler 2 puff  2 puff Inhalation Q4H PRN Lorine Bears A, MD      . allopurinol (ZYLOPRIM) tablet 300 mg  300 mg Oral Daily Erin Fulling, MD   300 mg at 02/10/20 0957  . amiodarone (PACERONE) tablet 200 mg  200 mg Oral BID Lorine Bears A, MD      . apixaban (ELIQUIS) tablet 5 mg  5 mg Oral BID Vida Rigger, MD   5 mg at 02/10/20 0958  . atorvastatin (LIPITOR) tablet 40 mg  40 mg Oral QHS Erin Fulling, MD   40 mg at 02/09/20 2114  . bisacodyl (DULCOLAX) suppository 10 mg  10 mg Rectal Daily PRN Pokhrel, Laxman, MD   10 mg at 02/08/20 0619  .  carvedilol (COREG) tablet 6.25 mg  6.25 mg Oral BID WC Debbe Odea, MD   6.25 mg at 02/10/20 0958  . chlorhexidine (PERIDEX) 0.12 % solution 15 mL  15 mL Mouth Rinse BID Rust-Chester, Britton L, NP   15 mL at 02/10/20 0956  . Chlorhexidine Gluconate Cloth 2 % PADS 6 each  6 each Topical Daily Iran Ouch, MD   6 each at 02/10/20 1004  .  docusate (COLACE) 50 MG/5ML liquid 100 mg  100 mg Oral BID Erin Fulling, MD   100 mg at 02/10/20 1004  . famotidine (PEPCID) tablet 20 mg  20 mg Oral Daily Sharen Hones, RPH   20 mg at 02/10/20 0957  . feeding supplement (ENSURE ENLIVE / ENSURE PLUS) liquid 237 mL  237 mL Oral TID BM Pokhrel, Laxman, MD   237 mL at 02/10/20 1352  . furosemide (LASIX) tablet 20 mg  20 mg Oral Daily Eula Listen M, PA-C   20 mg at 02/10/20 7829  . insulin aspart (novoLOG) injection 0-9 Units  0-9 Units Subcutaneous Q4H Iran Ouch, MD   1 Units at 02/10/20 1351  . MEDLINE mouth rinse  15 mL Mouth Rinse q12n4p Rust-Chester, Britton L, NP   15 mL at 02/09/20 1600  . melatonin tablet 5 mg  5 mg Oral QHS Vida Rigger, MD   5 mg at 02/09/20 2113  . metoprolol tartrate (LOPRESSOR) injection 5 mg  5 mg Intravenous Q2H PRN Iran Ouch, MD   5 mg at 01/25/20 1259  . multivitamin with minerals tablet 1 tablet  1 tablet Oral Daily Pokhrel, Laxman, MD   1 tablet at 02/10/20 0957  . OLANZapine zydis (ZYPREXA) disintegrating tablet 10 mg  10 mg Oral TID Ellagrace Yoshida, Jackquline Denmark, MD   10 mg at 02/10/20 0958  . ondansetron (ZOFRAN) injection 4 mg  4 mg Intravenous Q6H PRN Lorine Bears A, MD      . polyethylene glycol (MIRALAX / GLYCOLAX) packet 17 g  17 g Oral Daily Erin Fulling, MD   17 g at 02/09/20 1013  . polyethylene glycol (MIRALAX / GLYCOLAX) packet 17 g  17 g Oral Daily PRN Erin Fulling, MD      . potassium chloride (KLOR-CON) packet 20 mEq  20 mEq Oral Daily Sharen Hones, RPH   20 mEq at 02/10/20 0957  . sacubitril-valsartan (ENTRESTO) 24-26 mg per tablet  1 tablet  Oral BID End, Cristal Deer, MD   1 tablet at 02/10/20 0955  . senna-docusate (Senokot-S) tablet 1 tablet  1 tablet Oral BID Erin Fulling, MD   1 tablet at 02/10/20 0957  . sodium chloride flush (NS) 0.9 % injection 10-40 mL  10-40 mL Intracatheter Q12H Lorine Bears A, MD   10 mL at 02/10/20 1005  . sodium chloride flush (NS) 0.9 % injection 3 mL  3 mL Intravenous Q12H Lorine Bears A, MD   3 mL at 02/10/20 1005  . spironolactone (ALDACTONE) tablet 12.5 mg  12.5 mg Oral Daily Debbe Odea, MD   12.5 mg at 02/10/20 0957    Musculoskeletal: Strength & Muscle Tone: decreased Gait & Station: unsteady Patient leans: N/A  Psychiatric Specialty Exam: Physical Exam Vitals and nursing note reviewed.  HENT:     Head: Normocephalic and atraumatic.  Eyes:     Conjunctiva/sclera: Conjunctivae normal.     Pupils: Pupils are equal, round, and reactive to light.  Cardiovascular:     Heart sounds: Normal heart sounds.  Pulmonary:     Effort: Pulmonary effort is normal.  Abdominal:     Palpations: Abdomen is soft.  Musculoskeletal:        General: Normal range of motion.     Cervical back: Normal range of motion.  Skin:    General: Skin is warm and dry.  Neurological:     Mental Status: He is alert.     Cranial Nerves: Cranial nerve deficit present.  Psychiatric:  Attention and Perception: He is inattentive.        Mood and Affect: Affect is blunt.        Speech: He is noncommunicative.        Cognition and Memory: Cognition is impaired.     Review of Systems  Unable to perform ROS: Mental status change    Blood pressure 102/62, pulse 61, temperature 97.9 F (36.6 C), temperature source Oral, resp. rate 16, height 5' 7.01" (1.702 m), weight 62.6 kg, SpO2 98 %.Body mass index is 21.61 kg/m.  General Appearance: Casual  Eye Contact:  Fair  Speech:  Garbled  Volume:  Decreased  Mood:  Euthymic  Affect:  Constricted  Thought Process:  Disorganized  Orientation:  Full  (Time, Place, and Person)  Thought Content:  Illogical  Suicidal Thoughts:  No  Homicidal Thoughts:  No  Memory:  Negative  Judgement:  Negative  Insight:  Negative  Psychomotor Activity:  Negative  Concentration:  Concentration: Poor  Recall:  Poor  Fund of Knowledge:  Fair  Language:  Fair  Akathisia:  No  Handed:  Right  AIMS (if indicated):     Assets:  Resilience  ADL's:  Impaired  Cognition:  Impaired,  Mild and Severe  Sleep:        Treatment Plan Summary: Medication management and Plan Patient with delirium improved still confused some agitation but greatly improved over last week.  I have changed the olanzapine from the IV to oral dissolving.  Reassess tomorrow if continuing to do well we can start cutting the dose down.  Disposition: Patient does not meet criteria for psychiatric inpatient admission. Supportive therapy provided about ongoing stressors.  Mordecai Rasmussen, MD 02/10/2020 5:59 PM

## 2020-02-10 NOTE — Progress Notes (Signed)
Patient BP 85/59 (MAP 68). NP notified. Will continue to monitor patient and will reassess BP and HR before amio administration as per NP.

## 2020-02-10 NOTE — Evaluation (Signed)
Occupational Therapy Evaluation Patient Details Name: Travis Maxwell MRN: [id number] DOB: 1950-04-06 Today's Date: 02/10/2020    History of Present Illness presented to ER secondary to CP, SOB; admitted for mangement of severe acute hypoxic and hypercapnic respiratory failure, acute toxic metabolic encephalopathy and cardiogenic shock.  Hospital course significacnt for intubation 11/1-11/6, 11/14-11/20, currently on RA; L/R heart cath (11/19) with no obstuctive lesions noted, EF approx 20%.  Recommended for lifevest at DC with further evaluation for AICD placement..   Clinical Impression   Pt seen for OT re-evaluation this date in setting of prolonged hospitalization with delirium. OT facilitates pt participation in sup to sit with CGA. Pt demos F static EOB sitting with UE support. Pt with increased wakefulness this session versus PT treatment earlier this date. OT engages pt in LB dressing with MOD A to don socks in sitting. Pt able to perform sit to stand with MIN A with RW, multimodal cues for safety utilized. Pt performs fxl mobiltiy with RW and MIN A to hallway and back with sitter following with chair as pt is impulsive. Pt demos progress with OT this date. Will continue to follow. Continue to anticipate pt will require STR upon d/c from acute setting.     SNF    Equipment Recommendations  Other (comment) (defer to next level of care)    Recommendations for Other Services       Precautions / Restrictions Precautions Precautions: Fall Restrictions Weight Bearing Restrictions: No      Mobility Bed Mobility Overal bed mobility: Needs Assistance Bed Mobility: Supine to Sit;Sit to Supine     Supine to sit: Min guard Sit to supine: Min guard   General bed mobility comments: extra time, cues    Transfers Overall transfer level: Needs assistance Equipment used: Rolling walker (2 wheeled) Transfers: Sit to/from Stand Sit to Stand: Min assist         General transfer  comment: improved wakefulness and improved overall transfer status with OT in afternoon. Multimodal cues for safety.    Balance Overall balance assessment: Needs assistance Sitting-balance support: Feet supported;Bilateral upper extremity supported Sitting balance-Leahy Scale: Fair Sitting balance - Comments: UE support to sustain static sit   Standing balance support: Bilateral upper extremity supported Standing balance-Leahy Scale: Poor Standing balance comment: Requires MIN A external support for safety, second person follow d/t impulsivity and B UE support with RW                           ADL either performed or assessed with clinical judgement   ADL Overall ADL's : Needs assistance/impaired     Grooming: Minimal assistance;Sitting;Cueing for sequencing;Cueing for safety   Upper Body Bathing: Minimal assistance;Moderate assistance;Sitting Upper Body Bathing Details (indicate cue type and reason): based on clinical observation Lower Body Bathing: Moderate assistance;Sitting/lateral leans Lower Body Bathing Details (indicate cue type and reason): based on clinical observation Upper Body Dressing : Minimal assistance;Sitting   Lower Body Dressing: Moderate assistance;Sitting/lateral leans;Cueing for safety;Cueing for sequencing Lower Body Dressing Details (indicate cue type and reason): to don socks seated EOB             Functional mobility during ADLs: Minimal assistance;+2 for safety/equipment;Rolling walker (MIN A with RW with sitter following with chair)       Vision Patient Visual Report: No change from baseline Additional Comments: difficult to assess d/t cognition     Perception     Praxis  Pertinent Vitals/Pain Pain Assessment: Faces Faces Pain Scale: No hurt     Hand Dominance     Extremity/Trunk Assessment Upper Extremity Assessment Upper Extremity Assessment: Generalized weakness   Lower Extremity Assessment Lower Extremity  Assessment: Generalized weakness       Communication Communication Communication: Other (comment) (mumbling speech)   Cognition Arousal/Alertness: Lethargic Behavior During Therapy: WFL for tasks assessed/performed Overall Cognitive Status: Impaired/Different from baseline Area of Impairment: Orientation;Attention;Memory;Following commands;Safety/judgement;Awareness;Problem solving                 Orientation Level: Disoriented to;Time;Situation;Place;Person Current Attention Level: Sustained Memory: Decreased short-term memory Following Commands: Follows one step commands inconsistently;Follows one step commands with increased time Safety/Judgement: Decreased awareness of safety;Decreased awareness of deficits Awareness: Emergent Problem Solving: Slow processing;Decreased initiation;Difficulty sequencing;Requires verbal cues;Requires tactile cues General Comments: Pt more awake this afternoon, participatory, able to follow simple one step commands ~60-70% of the time   General Comments       Exercises Other Exercises Other Exercises: OT facilitates pt participation in sup to sit with CGA. Pt demos F static EOB sitting with UE support. Pt with increased wakefulness this session versus PT treatment earlier this date. OT engages pt in LB dressing with MOD A to don socks in sitting. Pt able to perform sit to stand with MIN A with RW, multimodal cues for safety utilized. Pt performs fxl mobiltiy with RW and MIN A to hallway and back with sitter following with chair as pt is impulsive. Pt demos progress with OT this date. Will continue to follow.   Shoulder Instructions      Home Living                                   Additional Comments: Patient poor historian; unable to provide reliable history.  Will verify with family as available.  Previous documentation reports patient/wife living in [place].      Prior Functioning/Environment Level of Independence:  Independent        Comments: Patient poor historian; unable to provide reliable history.  Will verify with family as available.  Previous documentation reports patient indep with mobility, ADLs        OT Problem List: Decreased strength;Decreased activity tolerance;Impaired balance (sitting and/or standing);Decreased safety awareness;Decreased knowledge of use of DME or AE;Decreased cognition;Cardiopulmonary status limiting activity      OT Treatment/Interventions: Self-care/ADL training;DME and/or AE instruction;Therapeutic activities;Balance training;Therapeutic exercise;Energy conservation;Patient/family education    OT Goals(Current goals can be found in the care plan section) Acute Rehab OT Goals Patient Stated Goal: none stated OT Goal Formulation: Patient unable to participate in goal setting Time For Goal Achievement: 02/24/20 Potential to Achieve Goals: Good  OT Frequency: Min 1X/week   Barriers to D/C:            Co-evaluation              AM-PAC OT "6 Clicks" Daily Activity     Outcome Measure Help from another person eating meals?: A Little Help from another person taking care of personal grooming?: A Little Help from another person toileting, which includes using toliet, bedpan, or urinal?: Total Help from another person bathing (including washing, rinsing, drying)?: Total Help from another person to put on and taking off regular upper body clothing?: A Lot Help from another person to put on and taking off regular lower body clothing?: Total 6 Click Score: 11   End  of Session Equipment Utilized During Treatment: Gait belt Nurse Communication: Mobility status  Activity Tolerance: Treatment limited secondary to agitation Patient left: in bed;with call bell/phone within reach;with bed alarm set;with nursing/sitter in room  OT Visit Diagnosis: Unsteadiness on feet (R26.81);Muscle weakness (generalized) (M62.81)                Time: 8295-6213 OT Time  Calculation (min): 30 min Charges:  OT General Charges $OT Visit: 1 Visit OT Evaluation $OT Re-eval: 1 Re-eval OT Treatments $Self Care/Home Management : 8-22 mins $Therapeutic Activity: 8-22 mins  Rejeana Brock, MS, OTR/L ascom [phone] 02/10/20, 5:01 PM

## 2020-02-10 NOTE — Progress Notes (Signed)
Progress Note  Patient Name: Travis Maxwell Date of Encounter: 02/10/2020  Primary Cardiologist: Kirke Corin  Subjective   Sitter at bedside.  No history able to be obtained from patient secondary to encephalopathy.  No acute overnight events. Did not sleep well.   Inpatient Medications    Scheduled Meds: . allopurinol  300 mg Oral Daily  . amiodarone  400 mg Oral BID  . apixaban  5 mg Oral BID  . atorvastatin  40 mg Oral QHS  . carvedilol  6.25 mg Oral BID WC  . chlorhexidine  15 mL Mouth Rinse BID  . Chlorhexidine Gluconate Cloth  6 each Topical Daily  . docusate  100 mg Oral BID  . famotidine  20 mg Oral Daily  . feeding supplement  237 mL Oral TID BM  . furosemide  20 mg Oral Daily  . insulin aspart  0-9 Units Subcutaneous Q4H  . mouth rinse  15 mL Mouth Rinse q12n4p  . melatonin  5 mg Oral QHS  . multivitamin with minerals  1 tablet Oral Daily  . OLANZapine zydis  10 mg Oral TID  . polyethylene glycol  17 g Oral Daily  . potassium chloride  20 mEq Oral Daily  . sacubitril-valsartan  1 tablet Oral BID  . senna-docusate  1 tablet Oral BID  . sodium chloride flush  10-40 mL Intracatheter Q12H  . sodium chloride flush  3 mL Intravenous Q12H  . spironolactone  12.5 mg Oral Daily   Continuous Infusions: . sodium chloride Stopped (02/05/20 1043)   PRN Meds: sodium chloride, acetaminophen, albuterol, bisacodyl, metoprolol tartrate, ondansetron (ZOFRAN) IV, polyethylene glycol   Vital Signs    Vitals:   02/10/20 0414 02/10/20 0434 02/10/20 0756 02/10/20 1138  BP: 96/64  103/64 104/61  Pulse: 67  69 68  Resp: 18  15 16   Temp: (!) 97.4 F (36.3 C)  97.9 F (36.6 C) 97.7 F (36.5 C)  TempSrc: Oral  Oral   SpO2: 96%  97% 98%  Weight:  62.6 kg    Height:        Intake/Output Summary (Last 24 hours) at 02/10/2020 1328 Last data filed at 02/10/2020 0400 Gross per 24 hour  Intake 240 ml  Output 700 ml  Net -460 ml   Filed Weights   02/08/20 0622 02/09/20 0431  02/10/20 0434  Weight: 64.9 kg 62.1 kg 62.6 kg    Telemetry    SR with occasional PVCs - Personally Reviewed  ECG    No new tracings - Personally Reviewed  Physical Exam   GEN: No acute distress. Mittens in place.  Neck: No JVD. Cardiac: RRR, no murmurs, rubs, or gallops.  Respiratory: Clear to auscultation bilaterally.  GI: Soft, nontender, non-distended.   MS: No edema; No deformity. Neuro:  Alert; Nonfocal.  Psych: Normal affect.  Labs    Chemistry Recent Labs  Lab 02/06/20 0406 02/07/20 0550 02/08/20 0533  NA 137 138 138  K 4.1 4.5 4.2  CL 104 104 103  CO2 23 22 21*  GLUCOSE 121* 93 109*  BUN 14 17 18   CREATININE 0.96 0.94 0.94  CALCIUM 9.1 9.6 9.7  GFRNONAA >60 >60 >60  ANIONGAP 10 12 14      Hematology Recent Labs  Lab 02/08/20 0533 02/09/20 0538 02/10/20 0527  WBC 11.8* 12.3* 15.5*  RBC 7.41* 7.72* 7.49*  HGB 16.1 16.7 16.5  HCT 52.1* 54.3* 53.3*  MCV 70.3* 70.3* 71.2*  MCH 21.7* 21.6* 22.0*  MCHC 30.9  30.8 31.0  RDW 28.5* 28.7* 28.4*  PLT 693* 611* 582*    Cardiac EnzymesNo results for input(s): TROPONINI in the last 168 hours. No results for input(s): TROPIPOC in the last 168 hours.   BNPNo results for input(s): BNP, PROBNP in the last 168 hours.   DDimer No results for input(s): DDIMER in the last 168 hours.   Radiology    No results found.  Cardiac Studies   LHC 01/24/20:  Previously placed Prox RCA to Mid RCA stent (unknown type) is widely patent.  Prox RCA lesion is 50% stenosed.  1. Patent stent in a nondominant right coronary artery. No significant coronary artery disease overall. 2. The replaced aortic valve was not crossed. 3. Right heart catheterization showed mildly elevated filling pressures, minimal pulmonary hypertension and normal cardiac output.  Recommendations: The patient has no ischemic substrate for ventricular tachycardia. He has nonischemic cardiomyopathy. His hemodynamics appear good overall.  Recommend continuing same dose of IV furosemide 20 mg daily which can be switched to an oral diuretic after extubation. I am going to stop lidocaine drip and continue amiodarone drip which can subsequently be switched to oral after extubation. I elected to add small dose carvedilol to assist with his cardiomyopathy and ventricular tachycardia.  Echo 01/21/20: 1. Left ventricular ejection fraction, by estimation, is 20 to 25%. The  left ventricle has severely decreased function. The left ventricle  demonstrates regional wall motion abnormalities (see scoring  diagram/findings for description). The left  ventricular internal cavity size was moderately dilated. There is mild  left ventricular hypertrophy. Left ventricular diastolic parameters are  consistent with Grade I diastolic dysfunction (impaired relaxation).  Elevated left atrial pressure. There is  severe global hypokinesis with relative sparing of the basal segments.  2. Right ventricular systolic function is moderately reduced. The right  ventricular size is mildly enlarged. Tricuspid regurgitation signal is  inadequate for assessing PA pressure.  3. Left atrial size was mildly dilated.  4. The mitral valve is grossly normal. Mild mitral valve regurgitation.  No evidence of mitral stenosis.  5. The aortic valve was not well visualized. Aortic valve regurgitation  is not visualized. Echo findings are consistent with normal structure and  function of the aortic valve prosthesis.  6. Pulmonic valve regurgitation not well assessed.  Patient Profile     69 y.o. male with history of CAD status post PCI, chronic systolic diastolic heart failure, diabetes, tobacco abuse, status post bioprosthetic aortic valve, paroxysmal atrial fibrillation, hypertension, hyperlipidemia, and THC abuse admitted with ventricular tachycardia requiring defibrillation and hypoxic respiratory failure due to MRSA and Pseudomonas pneumonia.   Assessment  & Plan    1. VT arrest: -Presented with chest pain and shortness of breath on 11/1 with worsening respiratory distress in the ER leading to intubation for airway protection.  Patient had pulseless VT requiring defibrillation. He subsequently underwent left heart cath which showed nonobstructive CAD. Amiodarone was initially held due to concern for QT prolongation. However per EP it was recommended to be resumed. He was also transiently on lidocaine which has been discontinued -Continue oral amiodarone 400 mg twice daily for 1 week (started on 12/2) followed by 20 mg twice daily for 1 week followed by 20 mg daily thereafter -Continue Coreg -In his current state of delirium/psychosis he is not a candidate for LifeVest and ultimately may not be a good candidate for ICD  -Maintain potassium greater than 4.0 and magnesium greater than 2.0 -Obtain EKG to evaluate QTc  2. CAD  s/p PCI: -No symptoms concerning for angina -Cath this admission with nonobstructive disease and patent stents -On Eliquis place of aspirin -Continue carvedilol, and Lipitor  3. Chronic combined systolic and diastolic CHF: -He appears euvolemic and well compensated -Continue Entresto, carvedilol, spironolactone, and Lasix -Look to escalate GDMT throughout his admission and in hospital follow-up -Relative hypotension precludes escalation of medical therapy at this time -Daily weights -Strict I's and O's  4. PAF: -Maintaining sinus rhythm -Continue amiodarone and Coreg as above -CHA2DS2-VASc 5 -Eliquis  5.  Delirium/psychosis: -Alert though not conversive -Unclear if this is a cerebral injury from anoxia given profound hypotension/VT this admission -Sitter at bedside -No family present  For questions or updates, please contact [facility] Please consult [email or web address] for contact info under Cardiology/STEMI.    Signed, Eula Listen, PA-C [facility] Pager: [phone] 02/10/2020, 1:28 PM

## 2020-02-10 NOTE — Progress Notes (Signed)
Physical Therapy Treatment Patient Details Name: Travis Maxwell MRN: [id number] DOB: 09-29-1950 Today's Date: 02/10/2020    History of Present Illness presented to ER secondary to CP, SOB; admitted for mangement of severe acute hypoxic and hypercapnic respiratory failure, acute toxic metabolic encephalopathy and cardiogenic shock.  Hospital course significacnt for intubation 11/1-11/6, 11/14-11/20, currently on RA; L/R heart cath (11/19) with no obstuctive lesions noted, EF approx 20%.  Recommended for lifevest at DC with further evaluation for AICD placement..    PT Comments    Pt is making limited progress towards goals. Able to stand with +2 HHA. Very lethargic and needs stimulation to stay awake. Minimal participation this date. Will continue to progress as able.    Follow Up Recommendations  SNF     Equipment Recommendations       Recommendations for Other Services       Precautions / Restrictions Precautions Precautions: Fall Restrictions Weight Bearing Restrictions: No    Mobility  Bed Mobility Overal bed mobility: Needs Assistance Bed Mobility: Supine to Sit;Sit to Supine     Supine to sit: Min assist Sit to supine: Max assist   General bed mobility comments: needs assist secondary to lethargy. Once seated, begins drooling and needs continued stimulation to maintain alertness. Able to scoot out towards EOB with min assist.  Transfers Overall transfer level: Needs assistance Equipment used: 2 person hand held assist Transfers: Sit to/from Stand Sit to Stand: Mod assist;+2 physical assistance         General transfer comment: +2 for transfer. Able to stand for approx 10 seconds prior to fatigue. Unable to re-attempt as pt too lethargic. Returned to bed  Ambulation/Gait             General Gait Details: unsafe/unable   Social research officer, government Rank[employer] (Stroke Patients Only)       Balance Overall balance  assessment: Needs assistance Sitting-balance support: Feet supported;Bilateral upper extremity supported Sitting balance-Leahy Scale: Poor     Standing balance support: Bilateral upper extremity supported Standing balance-Leahy Scale: Poor                              Cognition Arousal/Alertness: Lethargic Behavior During Therapy: WFL for tasks assessed/performed Overall Cognitive Status: Impaired/Different from baseline                                 General Comments: Pt very lethargic, appears to have day/night confused. Able to follow commands for simple tasks      Exercises      General Comments        Pertinent Vitals/Pain Pain Assessment: No/denies pain    Home Living                      Prior Function            PT Goals (current goals can now be found in the care plan section) Acute Rehab PT Goals Patient Stated Goal: none stated PT Goal Formulation: Patient unable to participate in goal setting Time For Goal Achievement: 02/12/20 Potential to Achieve Goals: Fair Progress towards PT goals: Progressing toward goals    Frequency    Min 2X/week      PT Plan Current plan remains appropriate    Co-evaluation  AM-PAC PT "6 Clicks" Mobility   Outcome Measure  Help needed turning from your back to your side while in a flat bed without using bedrails?: A Little Help needed moving from lying on your back to sitting on the side of a flat bed without using bedrails?: A Little Help needed moving to and from a bed to a chair (including a wheelchair)?: A Lot Help needed standing up from a chair using your arms (e.g., wheelchair or bedside chair)?: A Lot Help needed to walk in hospital room?: Total Help needed climbing 3-5 steps with a railing? : Total 6 Click Score: 12    End of Session   Activity Tolerance: Patient limited by fatigue Patient left: in bed (with 1:1 sitter present) Nurse Communication:  Mobility status PT Visit Diagnosis: Muscle weakness (generalized) (M62.81);Difficulty in walking, not elsewhere classified (R26.2);Other symptoms and signs involving the nervous system (R00.762)     Time: 2633-3545 PT Time Calculation (min) (ACUTE ONLY): 11 min  Charges:  $Therapeutic Activity: 8-22 mins                     Elizabeth Palau, PT, DPT [phone]    Travis Maxwell 02/10/2020, 2:09 PM

## 2020-02-11 LAB — CBC WITH DIFFERENTIAL/PLATELET
Abs Immature Granulocytes: 0.11 10*3/uL — ABNORMAL HIGH (ref 0.00–0.07)
Basophils Absolute: 0.3 10*3/uL — ABNORMAL HIGH (ref 0.0–0.1)
Basophils Relative: 3 %
Eosinophils Absolute: 0.8 10*3/uL — ABNORMAL HIGH (ref 0.0–0.5)
Eosinophils Relative: 6 %
HCT: 53 % — ABNORMAL HIGH (ref 39.0–52.0)
Hemoglobin: 16.3 g/dL (ref 13.0–17.0)
Immature Granulocytes: 1 %
Lymphocytes Relative: 16 %
Lymphs Abs: 2.1 10*3/uL (ref 0.7–4.0)
MCH: 21.9 pg — ABNORMAL LOW (ref 26.0–34.0)
MCHC: 30.8 g/dL (ref 30.0–36.0)
MCV: 71.3 fL — ABNORMAL LOW (ref 80.0–100.0)
Monocytes Absolute: 1.2 10*3/uL — ABNORMAL HIGH (ref 0.1–1.0)
Monocytes Relative: 9 %
Neutro Abs: 8.6 10*3/uL — ABNORMAL HIGH (ref 1.7–7.7)
Neutrophils Relative %: 65 %
Platelets: 525 10*3/uL — ABNORMAL HIGH (ref 150–400)
RBC: 7.43 MIL/uL — ABNORMAL HIGH (ref 4.22–5.81)
RDW: 27.9 % — ABNORMAL HIGH (ref 11.5–15.5)
WBC: 13.2 10*3/uL — ABNORMAL HIGH (ref 4.0–10.5)
nRBC: 0 % (ref 0.0–0.2)

## 2020-02-11 LAB — BASIC METABOLIC PANEL
Anion gap: 11 (ref 5–15)
BUN: 28 mg/dL — ABNORMAL HIGH (ref 8–23)
CO2: 23 mmol/L (ref 22–32)
Calcium: 9.6 mg/dL (ref 8.9–10.3)
Chloride: 106 mmol/L (ref 98–111)
Creatinine, Ser: 1.22 mg/dL (ref 0.61–1.24)
GFR, Estimated: >60 mL/min (ref >60)
Glucose, Bld: 126 mg/dL — ABNORMAL HIGH (ref 70–99)
Potassium: 4.3 mmol/L (ref 3.5–5.1)
Sodium: 140 mmol/L (ref 135–145)

## 2020-02-11 LAB — GLUCOSE, CAPILLARY
Glucose-Capillary: 158 mg/dL — ABNORMAL HIGH (ref 70–99)
Glucose-Capillary: 140 mg/dL — ABNORMAL HIGH (ref 70–99)
Glucose-Capillary: 137 mg/dL — ABNORMAL HIGH (ref 70–99)
Glucose-Capillary: 136 mg/dL — ABNORMAL HIGH (ref 70–99)
Glucose-Capillary: 206 mg/dL — ABNORMAL HIGH (ref 70–99)
Glucose-Capillary: 209 mg/dL — ABNORMAL HIGH (ref 70–99)

## 2020-02-11 LAB — MAGNESIUM: Magnesium: 2 mg/dL (ref 1.7–2.4)

## 2020-02-11 MED ORDER — OLANZAPINE 5 MG PO TBDP
5.0000 mg | ORAL_TABLET | Freq: Three times a day (TID) | ORAL | Status: DC
  Administered 2020-02-11 – 2020-02-12 (×3): 5 mg via ORAL
  Filled 2020-02-11 (×4): qty 1

## 2020-02-11 NOTE — Progress Notes (Signed)
PROGRESS NOTE    Travis Maxwell  PZW:[id number] DOB: 10/10/1950 DOA: 01/06/2020 PCP: [facility]   Brief Narrative:  The patient is a 69 year old African-American male with past medical history of hypertension, diabetes mellitus type 2, hyperlipidemia, GERD, coronary artery disease status post PCI, history of aortic valve replacement on Eliquis presented to the hospital with respiratory distress, chest pain and shortness of breath on 01/06/2020. He also had orthopnea and leg swelling. Initially, patient was put on BiPAP but his breathing status worsened so he was emergently intubated and admitted to the hospital. Patient was noted to be in severe cardiogenic shock on 01/07/2020. Vasopressors were weaned off on 11/ 6/21 and patient was extubated to BiPAP at that time but patient was subsequently intubated on 01/19/2020. Patient also had severe metabolic acidosis. EKG showed some concerns for ST elevation but did not meet ST elevation MI criteria as per cardiology. Patient received IV Cardizem and have a borderline elevated troponins. BNP on admission was 1046. Patient had a chest x-ray which showed vascular congestion. Of note, patient has history of Covid pneumonia on 12/04/2019. Patient remained extubated and was off pressors on 01/29/20 and was subsequently considered for transfer to medical service.  Patient has remained encephalopathic with a poor oral intake. Patient was started on NG tube and tube feeding was started on 02/03/2020. Has had episodes of agitation and confusion requiring sedation in 1-1 sitter  **Continues to remain encephalopathic and psychiatry is consulted.  He is very somnolent today and did not easily arouse.  He continues to recover from his delirium and psychiatry feels that he is doing a little bit better than a few days ago but patient remains somnolent.  Safety sitter is no longer there and psychiatry is going to decrease his olanzapine to 5 mg 3  times a day which is having his dose.  Will need to continue monitor his mental status carefully.  Cardiology recommending decreasing amiodarone dose to 200 hours twice daily and continue on carvedilol as well as his furosemide.  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  Olanzapine has been titrated down and cut in half -QTC remained prolonged at 593.  Cardiology is following. -Continued one-on-one sitter but is now off -PT OT recommending SNF can be discharged there  Chronic HFrEF -Nonischemic cardiomyopathy, EF of 20 to 25% -Cardiac catheterization 01/24/2020 with nonobstructive disease -Continue diuresis, beta-blockers with Carvedilol 6.25 mg po BID and Entresto and continue Spironolactone 12.5 mg po Daily  -Does not appear to be in exacerbation -Strict I's and O's and daily weights; -5.596 L since admission -He will need a LifeVest but given his current mental status he is not a candidate for ICD or a wearable defibrillator at this time and cardiology recommending continue Eliquis  S/P episode of VT subsequent cardiogenic shock -Status post cardioversion -Presently on oral amiodarone -The goal is to to keep potassium greater than 4 and magnesium greater  than 2.  PAF -On Coreg and Amiodarone: Cardiology decrease the dose of the amiodarone to 200 mg p.o. twice daily -Continue Eliquis  S/P  Pneumonia -Patient is status post treatment for Pseudomonas and MRSA pneumonia per tracheal culture. -Completed antibiotics 01/28/2020  Non-Severe Moderate protein calorie malnutrition -Due to decreased oral intake -Per patient's daughter, no tube feedings are warranted, she would like to have her father eat by himself. -On dysphagia 1 diet -Nutritionist  -With Ensure alive 237 mL p.o. 3 times daily between meals  Stage II sacral decub -poA -Continue wound care  Leukocytosis  -Fluctuating and likely reactive -Patient's WBC went from 12.3 -> 15.5 -> 13.2 -Continue to monitor for signs and symptoms of infection; currently no overt infection noted -Repeat CBC in a.m.  Thrombocytosis -Likely reactive next-trending down -Platelet Count was 611 and is now trending down is now 525 -Continue to Monitor  -Repeat CBC in AM  HLD -C/w Atorvastatin 40 mg po Daily qHS  DVT prophylaxis: Anticoagulated Apixaban Code Status: FULL CODE  Family Communication: No family present at bedside  Disposition Plan: Anticipate discharge to SNF once medically stable and not as encephalopathic  Status is: Inpatient  Remains inpatient appropriate because:Unsafe d/c plan, IV treatments appropriate due to intensity of illness or inability to take PO and Inpatient level of care appropriate due to severity of illness   Dispo: The patient is from: Home              Anticipated d/c is to: SNF              Anticipated d/c date is: 2 days              Patient currently is not medically stable to d/c.  Consultants:  -Cardiology -Pulmonary -Psychiatry -Palliative care medicine   Procedures:  Status post intubation and extubation  Cardiac catheterization 01/14/2020  NGT placement 02/02/2020  Antimicrobials:  Anti-infectives (From admission, onward)   Start     Dose/Rate Route Frequency Ordered Stop   01/27/20 1000  linezolid (ZYVOX) tablet 600 mg  Status:  Discontinued        600 mg Oral Every  12 hours 01/27/20 0824 01/27/20 0855   01/27/20 1000  linezolid (ZYVOX) IVPB 600 mg        600 mg 300 mL/hr over 60 Minutes Intravenous Every 12 hours 01/27/20 0855 01/28/20 2233   01/22/20 1300  linezolid (ZYVOX) tablet 600 mg  Status:  Discontinued        600 mg Per Tube Every 12 hours 01/22/20 1200 01/27/20 0824   01/21/20 1530  ceFEPIme (MAXIPIME) 2 g in sodium chloride 0.9 % 100 mL IVPB        2 g 200 mL/hr over 30 Minutes Intravenous Every 8 hours 01/21/20 1417 01/28/20 1630   01/12/20 2200  ceFAZolin (ANCEF) IVPB 2g/100 mL premix  Status:  Discontinued        2 g 200 mL/hr over 30 Minutes Intravenous Every 8 hours 01/12/20 1429 01/15/20 1102   01/11/20 1400  ceFEPIme (MAXIPIME) 2 g in sodium chloride 0.9 % 100 mL IVPB  Status:  Discontinued        2 g 200 mL/hr over 30 Minutes Intravenous Every 8 hours 01/11/20 1016 01/12/20 1422   01/10/20 1600  ceFEPIme (MAXIPIME) 2 g in sodium chloride 0.9 % 100 mL IVPB  Status:  Discontinued        2 g 200 mL/hr over 30 Minutes Intravenous Every 12 hours 01/10/20 1420  01/11/20 1016   01/07/20 2330  azithromycin (ZITHROMAX) 500 mg in sodium chloride 0.9 % 250 mL IVPB  Status:  Discontinued        500 mg 250 mL/hr over 60 Minutes Intravenous Every 24 hours 01/07/20 2234 01/11/20 1006   01/07/20 2330  cefTRIAXone (ROCEPHIN) 2 g in sodium chloride 0.9 % 100 mL IVPB  Status:  Discontinued        2 g 200 mL/hr over 30 Minutes Intravenous Every 24 hours 01/07/20 2234 01/10/20 1419   01/06/20 2015  remdesivir 100 mg in sodium chloride 0.9 % 100 mL IVPB  Status:  Discontinued        100 mg 200 mL/hr over 30 Minutes Intravenous  Once 01/06/20 1921 01/06/20 1931   01/06/20 2015  remdesivir 100 mg in sodium chloride 0.9 % 100 mL IVPB  Status:  Discontinued        100 mg 200 mL/hr over 30 Minutes Intravenous  Once 01/06/20 1921 01/06/20 1931   01/06/20 2015  remdesivir 100 mg in sodium chloride 0.9 % 100 mL IVPB  Status:  Discontinued        100 mg 200  mL/hr over 30 Minutes Intravenous  Once 01/06/20 1921 01/06/20 1931       Subjective: Seen and examined at bedside and he was resting and was sleeping and did not want to be awoken.  Unable to obtain a subjective history from the patient and no family is present at bedside.  Objective: Vitals:   02/11/20 0416 02/11/20 0732 02/11/20 1133 02/11/20 1747  BP:  118/67 (!) 85/50 95/72  Pulse:  76 72 75  Resp:  20 18 16   Temp:  97.8 F (36.6 C) 98.4 F (36.9 C) 97.9 F (36.6 C)  TempSrc:  Oral Oral Oral  SpO2:  100% 98% 100%  Weight: 75.8 kg     Height:        Intake/Output Summary (Last 24 hours) at 02/11/2020 1832 Last data filed at 02/11/2020 0200 Gross per 24 hour  Intake 270 ml  Output 250 ml  Net 20 ml   Filed Weights   02/09/20 0431 02/10/20 0434 02/11/20 0416  Weight: 62.1 kg 62.6 kg 75.8 kg   Examination: Physical Exam:  Constitutional: Patient is a thin chronically ill-appearing African-American male currently in no acute distress appears calm and comfortable Eyes: Lids and conjunctivae normal, sclerae anicteric  ENMT: External Ears, Nose appear normal Neck: Appears normal, supple, no cervical masses, normal ROM, no appreciable thyromegaly: No JVD Respiratory: Diminished to auscultation bilaterally, no wheezing, rales, rhonchi or crackles. Normal respiratory effort and patient is not tachypenic. No accessory muscle use.  Unlabored breathing Cardiovascular: RRR, no murmurs / rubs / gallops. S1 and S2 auscultated.  Minimal extremity edema Abdomen: Soft, non-tender, slightly-distended.  Bowel sounds positive.  GU: Deferred. Musculoskeletal: No clubbing / cyanosis of digits/nails. No joint deformity upper and lower extremities.  Skin: No rashes, lesions, ulcers on to skin evaluation. No induration; Warm and dry.  Neurologic: CN 2-12 grossly intact with no focal deficits. Romberg sign and cerebellar reflexes not assessed.  Psychiatric: Impaired judgment and insight.  He is  not awake or alert and not oriented x 3.  Appears calm    Data Reviewed: I have personally reviewed following labs and imaging studies  CBC: Recent Labs  Lab 02/07/20 0550 02/08/20 0533 02/09/20 0538 02/10/20 0527 02/11/20 0637  WBC 10.9* 11.8* 12.3* 15.5* 13.2*  NEUTROABS 5.5 7.1 8.1* 9.8* 8.6*  HGB 17.1*  16.1 16.7 16.5 16.3  HCT 54.6* 52.1* 54.3* 53.3* 53.0*  MCV 70.9* 70.3* 70.3* 71.2* 71.3*  PLT 693* 693* 611* 582* 525*   Basic Metabolic Panel: Recent Labs  Lab 02/05/20 0349 02/05/20 0349 02/05/20 1550 02/06/20 0406 02/07/20 0550 02/08/20 0533 02/09/20 0538 02/11/20 0637  NA 137  --   --  137 138 138  --  140  K 3.7  --   --  4.1 4.5 4.2  --  4.3  CL 104  --   --  104 104 103  --  106  CO2 22  --   --  23 22 21*  --  23  GLUCOSE 306*  --   --  121* 93 109*  --  126*  BUN 14  --   --  --  28*  CREATININE 0.79  --   --  0.96 0.94 0.94  --  1.22  CALCIUM 8.2*  --   --  9.1 9.6 9.7  --  9.6  MG 1.5*   < > 2.2 1.8 1.9 2.1  --  2.0  PHOS 3.5  --   --  4.1  --   --  4.0  --    < > = values in this interval not displayed.   GFR: Estimated Creatinine Clearance: 54.9 mL/min (by C-G formula based on SCr of 1.22 mg/dL). Liver Function Tests: No results for input(s): AST, ALT, ALKPHOS, BILITOT, PROT, ALBUMIN in the last 168 hours. No results for input(s): LIPASE, AMYLASE in the last 168 hours. No results for input(s): AMMONIA in the last 168 hours. Coagulation Profile: No results for input(s): INR, PROTIME in the last 168 hours. Cardiac Enzymes: No results for input(s): CKTOTAL, CKMB, CKMBINDEX, TROPONINI in the last 168 hours. BNP (last 3 results) No results for input(s): PROBNP in the last 8760 hours. HbA1C: No results for input(s): HGBA1C in the last 72 hours. CBG: Recent Labs  Lab 02/10/20 2326 02/11/20 0409 02/11/20 0739 02/11/20 1133 02/11/20 1742  GLUCAP 157* 158* 140* 137* 136*   Lipid Profile: No results for input(s): CHOL, HDL, LDLCALC,  TRIG, CHOLHDL, LDLDIRECT in the last 72 hours. Thyroid Function Tests: No results for input(s): TSH, T4TOTAL, FREET4, T3FREE, THYROIDAB in the last 72 hours. Anemia Panel: No results for input(s): VITAMINB12, FOLATE, FERRITIN, TIBC, IRON, RETICCTPCT in the last 72 hours. Sepsis Labs: No results for input(s): PROCALCITON, LATICACIDVEN in the last 168 hours.  No results found for this or any previous visit (from the past 240 hour(s)).   RN Pressure Injury Documentation: Pressure Injury 01/20/20 Sacrum Posterior;Medial Stage 2 -  Partial thickness loss of dermis presenting as a shallow open injury with a red, pink wound bed without slough. (Active)  01/20/20 0800  Location: Sacrum  Location Orientation: Posterior;Medial  Staging: Stage 2 -  Partial thickness loss of dermis presenting as a shallow open injury with a red, pink wound bed without slough.  Wound Description (Comments):   Present on Admission: No     Estimated body mass index is 26.15 kg/m as calculated from the following:   Height as of this encounter: 5' 7.01" (1.702 m).   Weight as of this encounter: 75.8 kg.  Malnutrition Type:  Nutrition Problem: Moderate Malnutrition Etiology: chronic illness (CHF)   Malnutrition Characteristics:  Signs/Symptoms: moderate fat depletion, moderate muscle depletion   Nutrition Interventions:  Interventions: Tube feeding, MVI     Radiology Studies: No results found.   Scheduled Meds: .  allopurinol  300 mg Oral Daily  . amiodarone  200 mg Oral BID  . apixaban  5 mg Oral BID  . atorvastatin  40 mg Oral QHS  . carvedilol  6.25 mg Oral BID WC  . chlorhexidine  15 mL Mouth Rinse BID  . Chlorhexidine Gluconate Cloth  6 each Topical Daily  . docusate  100 mg Oral BID  . famotidine  20 mg Oral Daily  . feeding supplement  237 mL Oral TID BM  . furosemide  20 mg Oral Daily  . insulin aspart  0-9 Units Subcutaneous Q4H  . mouth rinse  15 mL Mouth Rinse q12n4p  . melatonin  5  mg Oral QHS  . multivitamin with minerals  1 tablet Oral Daily  . OLANZapine zydis  5 mg Oral TID  . polyethylene glycol  17 g Oral Daily  . potassium chloride  20 mEq Oral Daily  . sacubitril-valsartan  1 tablet Oral BID  . senna-docusate  1 tablet Oral BID  . sodium chloride flush  10-40 mL Intracatheter Q12H  . sodium chloride flush  3 mL Intravenous Q12H  . spironolactone  12.5 mg Oral Daily   Continuous Infusions: . sodium chloride Stopped (02/05/20 1043)    LOS: 36 days   Merlene Laughter, DO [facility] PAGER is on AMION  If 7PM-7AM, please contact night-coverage [email or web address]

## 2020-02-11 NOTE — Consult Note (Signed)
[facility] Face-to-Face Psychiatry Consult   Reason for Consult: Follow-up consult 69 year old man with delirium Referring Physician: Marland Mcalpine Patient Identification: Travis Maxwell MRN:  [id number] Principal Diagnosis: Delirium Diagnosis:  Principal Problem:   Delirium Active Problems:   Acute on chronic systolic congestive heart failure (HCC)   Respiratory failure (HCC)   Malnutrition of moderate degree   Adult failure to thrive   Palliative care by specialist   DNR (do not resuscitate) discussion   Pressure injury of skin   Ventricular tachycardia (HCC)   Atrial fibrillation with RVR (HCC)   Total Time spent with patient: 30 minutes  Subjective:   Travis Maxwell is a 69 y.o. male patient admitted with "I am not good".  HPI: Patient seen for follow-up.  69 year old man with multiple medical problems who had been delirious for multiple days both in the ICU and after transfer to the ward.  Had responded to olanzapine initially given IM and now given orally.  Patient today was asleep but easily awake able.  He was not agitated.  He was able to answer basic questions including his name and that he was in the hospital.  He seemed to know the relative who was visiting him.  He had eaten a little bit and according to her conversed rationally a bit.  Patient not able to give any other history.  Denies pain but says he is not feeling good.  Past Psychiatric History: No real known past psychiatric history.  No prior treatment.  Risk to Self:   Risk to Others:   Prior Inpatient Therapy:   Prior Outpatient Therapy:    Past Medical History:  Past Medical History:  Diagnosis Date  . CAD (coronary artery disease)   . Chronic systolic (congestive) heart failure (HCC)   . Diabetes mellitus without complication (HCC)   . GERD (gastroesophageal reflux disease)   . Gout   . Hx of aortic valve replacement    Bioprosthetic valve  . Hypertension     Past Surgical History:  Procedure Laterality Date   . CORONARY STENT INTERVENTION N/A 07/25/2017   Procedure: CORONARY STENT INTERVENTION;  Surgeon: Alwyn Pea, MD;  Location: [facility] INVASIVE CV LAB;  Service: Cardiovascular;  Laterality: N/A;  . LEFT HEART CATH AND CORONARY ANGIOGRAPHY N/A 07/25/2017   Procedure: LEFT HEART CATH AND CORONARY ANGIOGRAPHY;  Surgeon: Lamar Blinks, MD;  Location: [facility] INVASIVE CV LAB;  Service: Cardiovascular;  Laterality: N/A;  . RIGHT/LEFT HEART CATH AND CORONARY ANGIOGRAPHY N/A 01/24/2020   Procedure: RIGHT/LEFT HEART CATH AND CORONARY ANGIOGRAPHY;  Surgeon: Iran Ouch, MD;  Location: [facility] INVASIVE CV LAB;  Service: Cardiovascular;  Laterality: N/A;   Family History:  Family History  Problem Relation Age of Onset  . CAD Mother   . CAD Father    Family Psychiatric  History: See previous note Social History:  Social History   Substance and Sexual Activity  Alcohol Use Not Currently     Social History   Substance and Sexual Activity  Drug Use Yes  . Types: Marijuana   Comment: today    Social History   Socioeconomic History  . Marital status: Divorced    Spouse name: Not on file  . Number of children: Not on file  . Years of education: Not on file  . Highest education level: Not on file  Occupational History  . Not on file  Tobacco Use  . Smoking status: Current Every Day Smoker    Packs/day: 0.50    Types:  Cigarettes    Last attempt to quit: 08/10/2017    Years since quitting: 2.5  . Smokeless tobacco: Never Used  Vaping Use  . Vaping Use: Never used  Substance and Sexual Activity  . Alcohol use: Not Currently  . Drug use: Yes    Types: Marijuana    Comment: today  . Sexual activity: Not on file  Other Topics Concern  . Not on file  Social History Narrative  . Not on file   Social Determinants of Health   Financial Resource Strain:   . Difficulty of Paying Living Expenses: Not on file  Food Insecurity:   . Worried About Programme researcher, broadcasting/film/video in the Last Year:  Not on file  . Ran Out of Food in the Last Year: Not on file  Transportation Needs:   . Lack of Transportation (Medical): Not on file  . Lack of Transportation (Non-Medical): Not on file  Physical Activity:   . Days of Exercise per Week: Not on file  . Minutes of Exercise per Session: Not on file  Stress:   . Feeling of Stress : Not on file  Social Connections:   . Frequency of Communication with Friends and Family: Not on file  . Frequency of Social Gatherings with Friends and Family: Not on file  . Attends Religious Services: Not on file  . Active Member of Clubs or Organizations: Not on file  . Attends Banker Meetings: Not on file  . Marital Status: Not on file   Additional Social History:    Allergies:   Allergies  Allergen Reactions  . Penicillins Other (See Comments)    Has patient had a PCN reaction causing immediate rash, facial/tongue/throat swelling, SOB or lightheadedness with hypotension: Unknown Has patient had a PCN reaction causing severe rash involving mucus membranes or skin necrosis: Unknown Has patient had a PCN reaction that required hospitalization: Unknown Has patient had a PCN reaction occurring within the last 10 years: No If all of the above answers are "NO", then may proceed with Cephalosporin use.     Labs:  Results for orders placed or performed during the hospital encounter of 01/06/20 (from the past 48 hour(s))  Glucose, capillary     Status: Abnormal   Collection Time: 02/09/20  7:20 PM  Result Value Ref Range   Glucose-Capillary 187 (H) 70 - 99 mg/dL    Comment: Glucose reference range applies only to samples taken after fasting for at least 8 hours.  Glucose, capillary     Status: Abnormal   Collection Time: 02/09/20  8:52 PM  Result Value Ref Range   Glucose-Capillary 143 (H) 70 - 99 mg/dL    Comment: Glucose reference range applies only to samples taken after fasting for at least 8 hours.  Glucose, capillary     Status:  Abnormal   Collection Time: 02/09/20 11:55 PM  Result Value Ref Range   Glucose-Capillary 160 (H) 70 - 99 mg/dL    Comment: Glucose reference range applies only to samples taken after fasting for at least 8 hours.  Glucose, capillary     Status: Abnormal   Collection Time: 02/10/20  4:18 AM  Result Value Ref Range   Glucose-Capillary 140 (H) 70 - 99 mg/dL    Comment: Glucose reference range applies only to samples taken after fasting for at least 8 hours.  CBC with Differential/Platelet     Status: Abnormal   Collection Time: 02/10/20  5:27 AM  Result Value Ref Range  WBC 15.5 (H) 4.0 - 10.5 K/uL   RBC 7.49 (H) 4.22 - 5.81 MIL/uL   Hemoglobin 16.5 13.0 - 17.0 g/dL   HCT 16.1 (H) 39 - 52 %   MCV 71.2 (L) 80.0 - 100.0 fL   MCH 22.0 (L) 26.0 - 34.0 pg   MCHC 31.0 30.0 - 36.0 g/dL   RDW 09.6 (H) 04.5 - 40.9 %   Platelets 582 (H) 150 - 400 K/uL   nRBC 0.0 0.0 - 0.2 %   Neutrophils Relative % 62 %   Neutro Abs 9.8 (H) 1.7 - 7.7 K/uL   Lymphocytes Relative 18 %   Lymphs Abs 2.7 0.7 - 4.0 K/uL   Monocytes Relative 9 %   Monocytes Absolute 1.4 (H) 0.1 - 1.0 K/uL   Eosinophils Relative 7 %   Eosinophils Absolute 1.0 (H) 0.0 - 0.5 K/uL   Basophils Relative 2 %   Basophils Absolute 0.3 (H) 0.0 - 0.1 K/uL   WBC Morphology MORPHOLOGY UNREMARKABLE    RBC Morphology See Note     Comment: MIXED RBC POPULATION   Immature Granulocytes 2 %   Abs Immature Granulocytes 0.24 (H) 0.00 - 0.07 K/uL   Giant PLTs PRESENT     Comment: Performed at [facility], [address] [place], [place] [address]  Glucose, capillary     Status: Abnormal   Collection Time: 02/10/20  7:32 AM  Result Value Ref Range   Glucose-Capillary 148 (H) 70 - 99 mg/dL    Comment: Glucose reference range applies only to samples taken after fasting for at least 8 hours.  Glucose, capillary     Status: Abnormal   Collection Time: 02/10/20 11:41 AM  Result Value Ref Range   Glucose-Capillary 142 (H) 70 - 99 mg/dL     Comment: Glucose reference range applies only to samples taken after fasting for at least 8 hours.  Glucose, capillary     Status: None   Collection Time: 02/10/20  4:35 PM  Result Value Ref Range   Glucose-Capillary 81 70 - 99 mg/dL    Comment: Glucose reference range applies only to samples taken after fasting for at least 8 hours.  Glucose, capillary     Status: Abnormal   Collection Time: 02/10/20  8:11 PM  Result Value Ref Range   Glucose-Capillary 144 (H) 70 - 99 mg/dL    Comment: Glucose reference range applies only to samples taken after fasting for at least 8 hours.  Glucose, capillary     Status: Abnormal   Collection Time: 02/10/20 11:26 PM  Result Value Ref Range   Glucose-Capillary 157 (H) 70 - 99 mg/dL    Comment: Glucose reference range applies only to samples taken after fasting for at least 8 hours.  Glucose, capillary     Status: Abnormal   Collection Time: 02/11/20  4:09 AM  Result Value Ref Range   Glucose-Capillary 158 (H) 70 - 99 mg/dL    Comment: Glucose reference range applies only to samples taken after fasting for at least 8 hours.  CBC with Differential/Platelet     Status: Abnormal   Collection Time: 02/11/20  6:37 AM  Result Value Ref Range   WBC 13.2 (H) 4.0 - 10.5 K/uL   RBC 7.43 (H) 4.22 - 5.81 MIL/uL   Hemoglobin 16.3 13.0 - 17.0 g/dL   HCT 47.8 (H) 39 - 52 %   MCV 71.3 (L) 80.0 - 100.0 fL   MCH 21.9 (L) 26.0 - 34.0 pg  MCHC 30.8 30.0 - 36.0 g/dL   RDW 46.9 (H) 62.9 - 52.8 %   Platelets 525 (H) 150 - 400 K/uL   nRBC 0.0 0.0 - 0.2 %   Neutrophils Relative % 65 %   Neutro Abs 8.6 (H) 1.7 - 7.7 K/uL   Lymphocytes Relative 16 %   Lymphs Abs 2.1 0.7 - 4.0 K/uL   Monocytes Relative 9 %   Monocytes Absolute 1.2 (H) 0.1 - 1.0 K/uL   Eosinophils Relative 6 %   Eosinophils Absolute 0.8 (H) 0.0 - 0.5 K/uL   Basophils Relative 3 %   Basophils Absolute 0.3 (H) 0.0 - 0.1 K/uL   Immature Granulocytes 1 %   Abs Immature Granulocytes 0.11 (H) 0.00 -  0.07 K/uL    Comment: Performed at [facility], [address], [place], [place] [address]  Basic metabolic panel     Status: Abnormal   Collection Time: 02/11/20  6:37 AM  Result Value Ref Range   Sodium 140 135 - 145 mmol/L   Potassium 4.3 3.5 - 5.1 mmol/L   Chloride 106 98 - 111 mmol/L   CO2 23 22 - 32 mmol/L   Glucose, Bld 126 (H) 70 - 99 mg/dL    Comment: Glucose reference range applies only to samples taken after fasting for at least 8 hours.   BUN 28 (H) 8 - 23 mg/dL   Creatinine, Ser 4.01 0.61 - 1.24 mg/dL   Calcium 9.6 8.9 - 02.7 mg/dL   GFR, Estimated >25 >36 mL/min    Comment: (NOTE) Calculated using the CKD-EPI Creatinine Equation (2021)    Anion gap 11 5 - 15    Comment: Performed at [facility], [address] [place], [place] [address]  Magnesium     Status: None   Collection Time: 02/11/20  6:37 AM  Result Value Ref Range   Magnesium 2.0 1.7 - 2.4 mg/dL    Comment: Performed at [facility], [address] [place], [place] [address]  Glucose, capillary     Status: Abnormal   Collection Time: 02/11/20  7:39 AM  Result Value Ref Range   Glucose-Capillary 140 (H) 70 - 99 mg/dL    Comment: Glucose reference range applies only to samples taken after fasting for at least 8 hours.  Glucose, capillary     Status: Abnormal   Collection Time: 02/11/20 11:33 AM  Result Value Ref Range   Glucose-Capillary 137 (H) 70 - 99 mg/dL    Comment: Glucose reference range applies only to samples taken after fasting for at least 8 hours.    Current Facility-Administered Medications  Medication Dose Route Frequency Provider Last Rate Last Admin  . 0.9 %  sodium chloride infusion   Intravenous PRN Vida Rigger, MD   Stopped at 02/05/20 1043  . acetaminophen (TYLENOL) tablet 650 mg  650 mg Oral Q4H PRN Kasa, Kurian, MD      . albuterol (VENTOLIN HFA) 108 (90 Base) MCG/ACT inhaler 2 puff  2 puff Inhalation Q4H PRN Lorine Bears A, MD      .  allopurinol (ZYLOPRIM) tablet 300 mg  300 mg Oral Daily Erin Fulling, MD   300 mg at 02/11/20 0959  . amiodarone (PACERONE) tablet 200 mg  200 mg Oral BID Lorine Bears A, MD   200 mg at 02/11/20 0954  . apixaban (ELIQUIS) tablet 5 mg  5 mg Oral BID Vida Rigger, MD   5 mg at 02/11/20 0954  . atorvastatin (LIPITOR) tablet  40 mg  40 mg Oral QHS Erin Fulling, MD   40 mg at 02/10/20 2223  . bisacodyl (DULCOLAX) suppository 10 mg  10 mg Rectal Daily PRN Pokhrel, Laxman, MD   10 mg at 02/08/20 0619  . carvedilol (COREG) tablet 6.25 mg  6.25 mg Oral BID WC Debbe Odea, MD   6.25 mg at 02/11/20 0955  . chlorhexidine (PERIDEX) 0.12 % solution 15 mL  15 mL Mouth Rinse BID Rust-Chester, Britton L, NP   15 mL at 02/11/20 0954  . Chlorhexidine Gluconate Cloth 2 % PADS 6 each  6 each Topical Daily Iran Ouch, MD   6 each at 02/11/20 [id number]  . docusate (COLACE) 50 MG/5ML liquid 100 mg  100 mg Oral BID Erin Fulling, MD   100 mg at 02/11/20 0959  . famotidine (PEPCID) tablet 20 mg  20 mg Oral Daily Sharen Hones, RPH   20 mg at 02/11/20 0954  . feeding supplement (ENSURE ENLIVE / ENSURE PLUS) liquid 237 mL  237 mL Oral TID BM Pokhrel, Laxman, MD   237 mL at 02/11/20 0959  . furosemide (LASIX) tablet 20 mg  20 mg Oral Daily Eula Listen M, PA-C   20 mg at 02/11/20 0955  . insulin aspart (novoLOG) injection 0-9 Units  0-9 Units Subcutaneous Q4H Iran Ouch, MD   1 Units at 02/11/20 1334  . MEDLINE mouth rinse  15 mL Mouth Rinse q12n4p Rust-Chester, Britton L, NP   15 mL at 02/09/20 1600  . melatonin tablet 5 mg  5 mg Oral QHS Vida Rigger, MD   5 mg at 02/10/20 2223  . metoprolol tartrate (LOPRESSOR) injection 5 mg  5 mg Intravenous Q2H PRN Iran Ouch, MD   5 mg at 01/25/20 1259  . multivitamin with minerals tablet 1 tablet  1 tablet Oral Daily Pokhrel, Laxman, MD   1 tablet at 02/11/20 0954  . OLANZapine zydis (ZYPREXA) disintegrating tablet 5 mg  5 mg Oral TID Aryssa Rosamond T, MD       . ondansetron (ZOFRAN) injection 4 mg  4 mg Intravenous Q6H PRN Lorine Bears A, MD      . polyethylene glycol (MIRALAX / GLYCOLAX) packet 17 g  17 g Oral Daily Erin Fulling, MD   17 g at 02/09/20 1013  . polyethylene glycol (MIRALAX / GLYCOLAX) packet 17 g  17 g Oral Daily PRN Erin Fulling, MD      . potassium chloride (KLOR-CON) packet 20 mEq  20 mEq Oral Daily Sharen Hones, RPH   20 mEq at 02/11/20 0954  . sacubitril-valsartan (ENTRESTO) 24-26 mg per tablet  1 tablet Oral BID End, Cristal Deer, MD   1 tablet at 02/11/20 0954  . senna-docusate (Senokot-S) tablet 1 tablet  1 tablet Oral BID Erin Fulling, MD   1 tablet at 02/11/20 0954  . sodium chloride flush (NS) 0.9 % injection 10-40 mL  10-40 mL Intracatheter Q12H Lorine Bears A, MD   10 mL at 02/10/20 1005  . sodium chloride flush (NS) 0.9 % injection 3 mL  3 mL Intravenous Q12H Lorine Bears A, MD   3 mL at 02/10/20 2230  . spironolactone (ALDACTONE) tablet 12.5 mg  12.5 mg Oral Daily Debbe Odea, MD   12.5 mg at 02/11/20 4008    Musculoskeletal: Strength & Muscle Tone: decreased Gait & Station: unsteady Patient leans: N/A  Psychiatric Specialty Exam: Physical Exam Vitals reviewed.  Constitutional:      Appearance: He is well-developed.  HENT:     Head: Normocephalic and atraumatic.  Eyes:     Conjunctiva/sclera: Conjunctivae normal.     Pupils: Pupils are equal, round, and reactive to light.  Cardiovascular:     Heart sounds: Normal heart sounds.  Pulmonary:     Effort: Pulmonary effort is normal.  Abdominal:     Palpations: Abdomen is soft.  Musculoskeletal:        General: Normal range of motion.     Cervical back: Normal range of motion.  Skin:    General: Skin is warm and dry.  Neurological:     Mental Status: He is alert.     Comments: Left side appears weaker  Psychiatric:        Attention and Perception: He is inattentive.        Mood and Affect: Affect is blunt.        Speech: Speech is  delayed and slurred.        Behavior: Behavior is slowed and withdrawn.        Thought Content: Thought content does not include homicidal or suicidal ideation.        Cognition and Memory: Cognition is impaired. Memory is impaired.     Review of Systems  Unable to perform ROS: Mental status change    Blood pressure (!) 85/50, pulse 72, temperature 98.4 F (36.9 C), temperature source Oral, resp. rate 18, height 5' 7.01" (1.702 m), weight 75.8 kg, SpO2 98 %.Body mass index is 26.15 kg/m.  General Appearance: Disheveled  Eye Contact:  Minimal  Speech:  Slow  Volume:  Decreased  Mood:  Euthymic  Affect:  Constricted  Thought Process:  NA  Orientation:  Full (Time, Place, and Person)  Thought Content:  Tangential  Suicidal Thoughts:  No  Homicidal Thoughts:  No  Memory:  Immediate;   Fair Recent;   Poor Remote;   Poor  Judgement:  Impaired  Insight:  Shallow  Psychomotor Activity:  Decreased  Concentration:  Concentration: Poor  Recall:  Poor  Fund of Knowledge:  Fair  Language:  Poor  Akathisia:  No  Handed:  Right  AIMS (if indicated):     Assets:  Resilience Social Support  ADL's:  Impaired  Cognition:  Impaired,  Moderate  Sleep:        Treatment Plan Summary: Plan Patient is recovering from delirium.  Seems to be doing much better than he was a few days ago.  No longer requiring a sitter to prevent injury and no longer requiring mitts to keep himself from pulling tubes out.  I am going to decrease the dose of the olanzapine to 5 mg 3 times a day which is half of what he has been giving.  We may be able to get it discontinued entirely prior to discharge.  I informed the patient and his relative what I was doing today.  Disposition: No evidence of imminent risk to self or others at present.   Patient does not meet criteria for psychiatric inpatient admission. Supportive therapy provided about ongoing stressors.  Travis Rasmussen, MD 02/11/2020 4:51 PM

## 2020-02-12 LAB — COMPREHENSIVE METABOLIC PANEL
ALT: 11 U/L (ref 0–44)
AST: 17 U/L (ref 15–41)
Albumin: 3.2 g/dL — ABNORMAL LOW (ref 3.5–5.0)
Alkaline Phosphatase: 57 U/L (ref 38–126)
Anion gap: 9 (ref 5–15)
BUN: 28 mg/dL — ABNORMAL HIGH (ref 8–23)
CO2: 21 mmol/L — ABNORMAL LOW (ref 22–32)
Calcium: 8.9 mg/dL (ref 8.9–10.3)
Chloride: 109 mmol/L (ref 98–111)
Creatinine, Ser: 0.99 mg/dL (ref 0.61–1.24)
GFR, Estimated: >60 mL/min (ref >60)
Glucose, Bld: 109 mg/dL — ABNORMAL HIGH (ref 70–99)
Potassium: 4 mmol/L (ref 3.5–5.1)
Sodium: 139 mmol/L (ref 135–145)
Total Bilirubin: 1 mg/dL (ref 0.3–1.2)
Total Protein: 7 g/dL (ref 6.5–8.1)

## 2020-02-12 LAB — CBC WITH DIFFERENTIAL/PLATELET
Abs Immature Granulocytes: 0.16 10*3/uL — ABNORMAL HIGH (ref 0.00–0.07)
Basophils Absolute: 0.3 10*3/uL — ABNORMAL HIGH (ref 0.0–0.1)
Basophils Relative: 2 %
Eosinophils Absolute: 0.8 10*3/uL — ABNORMAL HIGH (ref 0.0–0.5)
Eosinophils Relative: 6 %
HCT: 50.9 % (ref 39.0–52.0)
Hemoglobin: 15.6 g/dL (ref 13.0–17.0)
Immature Granulocytes: 1 %
Lymphocytes Relative: 17 %
Lymphs Abs: 2.2 10*3/uL (ref 0.7–4.0)
MCH: 21.7 pg — ABNORMAL LOW (ref 26.0–34.0)
MCHC: 30.6 g/dL (ref 30.0–36.0)
MCV: 70.9 fL — ABNORMAL LOW (ref 80.0–100.0)
Monocytes Absolute: 1.3 10*3/uL — ABNORMAL HIGH (ref 0.1–1.0)
Monocytes Relative: 10 %
Neutro Abs: 8.2 10*3/uL — ABNORMAL HIGH (ref 1.7–7.7)
Neutrophils Relative %: 64 %
Platelets: 394 10*3/uL (ref 150–400)
RBC: 7.18 MIL/uL — ABNORMAL HIGH (ref 4.22–5.81)
RDW: 27.7 % — ABNORMAL HIGH (ref 11.5–15.5)
Smear Review: NORMAL
WBC: 12.9 10*3/uL — ABNORMAL HIGH (ref 4.0–10.5)
nRBC: 0 % (ref 0.0–0.2)

## 2020-02-12 LAB — GLUCOSE, CAPILLARY
Glucose-Capillary: 119 mg/dL — ABNORMAL HIGH (ref 70–99)
Glucose-Capillary: 122 mg/dL — ABNORMAL HIGH (ref 70–99)
Glucose-Capillary: 122 mg/dL — ABNORMAL HIGH (ref 70–99)
Glucose-Capillary: 152 mg/dL — ABNORMAL HIGH (ref 70–99)
Glucose-Capillary: 167 mg/dL — ABNORMAL HIGH (ref 70–99)
Glucose-Capillary: 180 mg/dL — ABNORMAL HIGH (ref 70–99)
Glucose-Capillary: 191 mg/dL — ABNORMAL HIGH (ref 70–99)

## 2020-02-12 LAB — MAGNESIUM: Magnesium: 1.8 mg/dL (ref 1.7–2.4)

## 2020-02-12 LAB — PHOSPHORUS: Phosphorus: 3.2 mg/dL (ref 2.5–4.6)

## 2020-02-12 MED ORDER — OLANZAPINE 5 MG PO TBDP
5.0000 mg | ORAL_TABLET | Freq: Two times a day (BID) | ORAL | Status: DC
  Administered 2020-02-13 – 2020-02-14 (×3): 5 mg via ORAL
  Filled 2020-02-12 (×4): qty 1

## 2020-02-12 NOTE — Plan of Care (Signed)
BP soft over night, provider notified. Will continue to monitor Problem: Education: Goal: Ability to demonstrate management of disease process will improve Outcome: Progressing Goal: Ability to verbalize understanding of medication therapies will improve Outcome: Progressing   Problem: Activity: Goal: Capacity to carry out activities will improve Outcome: Progressing   Problem: Cardiac: Goal: Ability to achieve and maintain adequate cardiopulmonary perfusion will improve Outcome: Progressing   Problem: Education: Goal: Knowledge of General Education information will improve Description: Including pain rating scale, medication(s)/side effects and non-pharmacologic comfort measures Outcome: Progressing   Problem: Health Behavior/Discharge Planning: Goal: Ability to manage health-related needs will improve Outcome: Progressing   Problem: Clinical Measurements: Goal: Ability to maintain clinical measurements within normal limits will improve Outcome: Progressing Goal: Will remain free from infection Outcome: Progressing Goal: Diagnostic test results will improve Outcome: Progressing Goal: Respiratory complications will improve Outcome: Progressing Goal: Cardiovascular complication will be avoided Outcome: Progressing   Problem: Activity: Goal: Risk for activity intolerance will decrease Outcome: Progressing   Problem: Nutrition: Goal: Adequate nutrition will be maintained Outcome: Progressing   Problem: Coping: Goal: Level of anxiety will decrease Outcome: Progressing   Problem: Elimination: Goal: Will not experience complications related to bowel motility Outcome: Progressing Goal: Will not experience complications related to urinary retention Outcome: Progressing   Problem: Pain Managment: Goal: General experience of comfort will improve Outcome: Progressing   Problem: Safety: Goal: Ability to remain free from injury will improve Outcome: Progressing    Problem: Skin Integrity: Goal: Risk for impaired skin integrity will decrease Outcome: Progressing

## 2020-02-12 NOTE — Progress Notes (Addendum)
Speech Language Pathology Treatment: Dysphagia  Patient Details Name: Travis Maxwell MRN: [id number] DOB: April 28, 1950 Today's Date: 02/12/2020 Time: 1510-1550 SLP Time Calculation (min) (ACUTE ONLY): 40 min  Assessment / Plan / Recommendation Clinical Impression  Pt seen at bedside for ongoing assessment of toleration of diet and trials to upgrade diet consistency if appropriate, safe for pt. Pt had initial assessments on 01/14/2020, 01/28/2020, 02/03/2020. Previous oral diets have been puree-minced in consistency w/ thin liquids w/ general aspiration precautions; currently he is on puree consistencies. Pt continues to exhibit Mod+ declined Cognitive/Mental Status awareness w/ refusal of po's including meds at times w/ NSG; increased distraction and need for Ativan when any agitation. Pt has had a NG placed for support d/t FTT issues.  Pt appears to present with grossly adequate oropharyngeal phase swallow function when consuming trials of puree and thin liquids via cup w/ support. Pt continues to be confused, w/ apparent delayed response times and reduced ability to follow directions. Pt intermittently follows simple 1-step directions at this time w/ Mod cues. Pt can also be difficult redirect to task at hand. Pt gives intermittent Mumbled verbalizations which are difficult to understand d/t decreased vocal intensity, articulation. Pt has a protruding lower lip at Baseline; drooling often occurs per NSG. Pt is edentulous status at Baseline. Pt appears to present w/ min increased oropharyngeal wetness intermittently & decreased ability to manage secretions/prevent anterior spillage of saliva prior to POs. NSG endorsed he has presented this way at Baseline w/out po's. Of note, any increased orapharyngeal secretions/decreased ability to manage secretions can increased risk of laryngeal penetration/aspiration. No unilateral weakness noted; lower labial protrusion Baseline for pt, and possibly contributing to  decreased management of secretions. Speech mumbled, suspect min Baseline; intermittently clear w/ pt increased effort/energy/focus to speaking. Pt's vocal intensity is reduced baseline, possibly s/p prolonged intubation or Cognitive decline.  During po trials, pt consumed trials of thin liquids, purees and softened solids given this session w/ No overt, immediate clinical s/s of aspiration noted during po trials. He consumed trials w/ No decline in status, increased respiratory effort, or coughing. No increased WOB breathing or distress noted during/post trials. Oral phase appeared grossly [facility] w/ timely bolus management and control of bolus propulsion for A-P transfer for swallowing of liquids and puree trials; MUNCHING and increased Time noted w/ trials of softened solids. Pt was also distracted during trial and required verbal/tactile cues to redirect attention to the bolus being held orally -- to swallow and clear it w/ lingual sweeping. Pt exhibited decreased awareness of the po trials during the oral prep stage and required increased verbal/tactile cues during this stage. Oral clearing achieved w/ trial consistencies when given Time w/ trials. Pt benefited from alternating foods and liquids. Lower labial protrusion noted throughout trials.  Recommend continue a Dysphagia 1 diet (puree) w/ moistened foods; Thin liquidsVIA CUP, monitor any straw use. Recommend Supervision at meals for encouragement of oral intake, safely. Recommend general aspiration precautions, Pills Crushed in Puree for safer, easier swallowing. NSG to assist w/ feeding w/ pt helping pt to hold cup to drink at meals. Pt should ONLY receive PO's when alert, attentive, & calm for safer swallwoing. Due to pt's Cognitive decline and its continued impact on his overall engagement and follow through for safe oral intake, recommend continue this diet consistency at D/C w/ Supervision. No further skilled ST services at this time until pt's  Cognitive status improves for consideration of diet upgrade. NSG & MD updated, agreed. Education provided &  posted in room.     HPI HPI: 69-year-old male status post COVID-19 infection 9/21 presenting with acute hypoxic respiratory failure and chest pain failing BiPAP requiring emergent intubation and mechanical ventilation admitted to the ICU. Over the course of 22 days, pt has required several intubations with most recent intuabtion 11/14 until 11/20, transitioned to BIPAP and was weaned to nasal cannula on 11/22. Pt's initial Bedside Swallow Evaluation was performed on 01/14/2020 with recommendation of dysphagia 1 with thin liquids. Pt currently NPO.      SLP Plan  All goals met;Discharge SLP treatment due to (comment) (no further progress)       Recommendations  Diet recommendations: Dysphagia 1 (puree);Thin liquid Liquids provided via: Cup;Straw Medication Administration: Crushed with puree (for safer swallowing) Supervision: Staff to assist with self feeding;Full supervision/cueing for compensatory strategies Compensations: Minimize environmental distractions;Slow rate;Small sips/bites;Lingual sweep for clearance of pocketing;Multiple dry swallows after each bite/sip;Follow solids with liquid Postural Changes and/or Swallow Maneuvers: Out of bed for meals;Upright 30-60 min after meal;Seated upright 90 degrees                General recommendations:  (Dietician f/u; Palliative Care f/u for GOC) Oral Care Recommendations: Oral care BID;Oral care before and after PO;Staff/trained caregiver to provide oral care Follow up Recommendations: Skilled Nursing facility (TBD) SLP Visit Diagnosis: Dysphagia, oral phase (R13.11) Plan: All goals met;Discharge SLP treatment due to (comment) (no further progress)       GO                   , MS, CCC-SLP Speech Language Pathologist Rehab Services [phone] , 02/12/2020, 3:54 PM    

## 2020-02-12 NOTE — Progress Notes (Signed)
  PT Cancellation Note  Patient Details Name: Fayette Hamada MRN: [id number] DOB: 02-20-1951   Cancelled Treatment:    Reason Eval/Treat Not Completed: Other (comment). Treatment attempted, however pt in bathroom with RN and reports he will be awhile. Will re-attempt next date.   Cruzita Lipa 02/12/2020, 2:48 PM  Elizabeth Palau, PT, DPT [phone]

## 2020-02-12 NOTE — Consult Note (Signed)
[facility] Face-to-Face Psychiatry Consult   Reason for Consult: Follow-up for patient recovering from delirium.  Patient seen chart reviewed.  Nursing reports that he still has a few episodes of agitation but for the most part is much improved.  No longer needs to have mitts to protect himself.  Patient was able to answer a couple of questions although he is still disoriented pay.  He was able to tell me that it is too cold in the room.  He seems to be eating a little bit better. Referring Physician: Swayze Patient Identification: Travis Maxwell MRN:  [id number] Principal Diagnosis: Delirium Diagnosis:  Principal Problem:   Delirium Active Problems:   Acute on chronic systolic congestive heart failure (HCC)   Respiratory failure (HCC)   Malnutrition of moderate degree   Adult failure to thrive   Palliative care by specialist   DNR (do not resuscitate) discussion   Pressure injury of skin   Ventricular tachycardia (HCC)   Atrial fibrillation with RVR (HCC)   Total Time spent with patient: 20 minutes  Subjective:   Travis Maxwell is a 69 y.o. male patient admitted with its to cold.  HPI: Patient seen.  See note above.  Sleepy tired not very active but less agitated.  Arousable.  Not aggressive or violent or threatening  Past Psychiatric History: None  Risk to Self:   Risk to Others:   Prior Inpatient Therapy:   Prior Outpatient Therapy:    Past Medical History:  Past Medical History:  Diagnosis Date  . CAD (coronary artery disease)   . Chronic systolic (congestive) heart failure (HCC)   . Diabetes mellitus without complication (HCC)   . GERD (gastroesophageal reflux disease)   . Gout   . Hx of aortic valve replacement    Bioprosthetic valve  . Hypertension     Past Surgical History:  Procedure Laterality Date  . CORONARY STENT INTERVENTION N/A 07/25/2017   Procedure: CORONARY STENT INTERVENTION;  Surgeon: Alwyn Pea, MD;  Location: [facility] INVASIVE CV LAB;  Service:  Cardiovascular;  Laterality: N/A;  . LEFT HEART CATH AND CORONARY ANGIOGRAPHY N/A 07/25/2017   Procedure: LEFT HEART CATH AND CORONARY ANGIOGRAPHY;  Surgeon: Lamar Blinks, MD;  Location: [facility] INVASIVE CV LAB;  Service: Cardiovascular;  Laterality: N/A;  . RIGHT/LEFT HEART CATH AND CORONARY ANGIOGRAPHY N/A 01/24/2020   Procedure: RIGHT/LEFT HEART CATH AND CORONARY ANGIOGRAPHY;  Surgeon: Iran Ouch, MD;  Location: [facility] INVASIVE CV LAB;  Service: Cardiovascular;  Laterality: N/A;   Family History:  Family History  Problem Relation Age of Onset  . CAD Mother   . CAD Father    Family Psychiatric  History: See previous Social History:  Social History   Substance and Sexual Activity  Alcohol Use Not Currently     Social History   Substance and Sexual Activity  Drug Use Yes  . Types: Marijuana   Comment: today    Social History   Socioeconomic History  . Marital status: Divorced    Spouse name: Not on file  . Number of children: Not on file  . Years of education: Not on file  . Highest education level: Not on file  Occupational History  . Not on file  Tobacco Use  . Smoking status: Current Every Day Smoker    Packs/day: 0.50    Types: Cigarettes    Last attempt to quit: 08/10/2017    Years since quitting: 2.5  . Smokeless tobacco: Never Used  Vaping Use  . Vaping  Use: Never used  Substance and Sexual Activity  . Alcohol use: Not Currently  . Drug use: Yes    Types: Marijuana    Comment: today  . Sexual activity: Not on file  Other Topics Concern  . Not on file  Social History Narrative  . Not on file   Social Determinants of Health   Financial Resource Strain:   . Difficulty of Paying Living Expenses: Not on file  Food Insecurity:   . Worried About Programme researcher, broadcasting/film/video in the Last Year: Not on file  . Ran Out of Food in the Last Year: Not on file  Transportation Needs:   . Lack of Transportation (Medical): Not on file  . Lack of Transportation  (Non-Medical): Not on file  Physical Activity:   . Days of Exercise per Week: Not on file  . Minutes of Exercise per Session: Not on file  Stress:   . Feeling of Stress : Not on file  Social Connections:   . Frequency of Communication with Friends and Family: Not on file  . Frequency of Social Gatherings with Friends and Family: Not on file  . Attends Religious Services: Not on file  . Active Member of Clubs or Organizations: Not on file  . Attends Banker Meetings: Not on file  . Marital Status: Not on file   Additional Social History:    Allergies:   Allergies  Allergen Reactions  . Penicillins Other (See Comments)    Has patient had a PCN reaction causing immediate rash, facial/tongue/throat swelling, SOB or lightheadedness with hypotension: Unknown Has patient had a PCN reaction causing severe rash involving mucus membranes or skin necrosis: Unknown Has patient had a PCN reaction that required hospitalization: Unknown Has patient had a PCN reaction occurring within the last 10 years: No If all of the above answers are "NO", then may proceed with Cephalosporin use.     Labs:  Results for orders placed or performed during the hospital encounter of 01/06/20 (from the past 48 hour(s))  Glucose, capillary     Status: Abnormal   Collection Time: 02/10/20  8:11 PM  Result Value Ref Range   Glucose-Capillary 144 (H) 70 - 99 mg/dL    Comment: Glucose reference range applies only to samples taken after fasting for at least 8 hours.  Glucose, capillary     Status: Abnormal   Collection Time: 02/10/20 11:26 PM  Result Value Ref Range   Glucose-Capillary 157 (H) 70 - 99 mg/dL    Comment: Glucose reference range applies only to samples taken after fasting for at least 8 hours.  Glucose, capillary     Status: Abnormal   Collection Time: 02/11/20  4:09 AM  Result Value Ref Range   Glucose-Capillary 158 (H) 70 - 99 mg/dL    Comment: Glucose reference range applies only to  samples taken after fasting for at least 8 hours.  CBC with Differential/Platelet     Status: Abnormal   Collection Time: 02/11/20  6:37 AM  Result Value Ref Range   WBC 13.2 (H) 4.0 - 10.5 K/uL   RBC 7.43 (H) 4.22 - 5.81 MIL/uL   Hemoglobin 16.3 13.0 - 17.0 g/dL   HCT 25.0 (H) 39 - 52 %   MCV 71.3 (L) 80.0 - 100.0 fL   MCH 21.9 (L) 26.0 - 34.0 pg   MCHC 30.8 30.0 - 36.0 g/dL   RDW 53.9 (H) 76.7 - 34.1 %   Platelets 525 (H) 150 - 400  K/uL   nRBC 0.0 0.0 - 0.2 %   Neutrophils Relative % 65 %   Neutro Abs 8.6 (H) 1.7 - 7.7 K/uL   Lymphocytes Relative 16 %   Lymphs Abs 2.1 0.7 - 4.0 K/uL   Monocytes Relative 9 %   Monocytes Absolute 1.2 (H) 0.1 - 1.0 K/uL   Eosinophils Relative 6 %   Eosinophils Absolute 0.8 (H) 0.0 - 0.5 K/uL   Basophils Relative 3 %   Basophils Absolute 0.3 (H) 0.0 - 0.1 K/uL   Immature Granulocytes 1 %   Abs Immature Granulocytes 0.11 (H) 0.00 - 0.07 K/uL    Comment: Performed at [facility], [address] [place], [place] [address]  Basic metabolic panel     Status: Abnormal   Collection Time: 02/11/20  6:37 AM  Result Value Ref Range   Sodium 140 135 - 145 mmol/L   Potassium 4.3 3.5 - 5.1 mmol/L   Chloride 106 98 - 111 mmol/L   CO2 23 22 - 32 mmol/L   Glucose, Bld 126 (H) 70 - 99 mg/dL    Comment: Glucose reference range applies only to samples taken after fasting for at least 8 hours.   BUN 28 (H) 8 - 23 mg/dL   Creatinine, Ser 6.81 0.61 - 1.24 mg/dL   Calcium 9.6 8.9 - 27.5 mg/dL   GFR, Estimated >17 >00 mL/min    Comment: (NOTE) Calculated using the CKD-EPI Creatinine Equation (2021)    Anion gap 11 5 - 15    Comment: Performed at [facility], [address] [place], [place] [address]  Magnesium     Status: None   Collection Time: 02/11/20  6:37 AM  Result Value Ref Range   Magnesium 2.0 1.7 - 2.4 mg/dL    Comment: Performed at [facility], [address] [place], [place] [address]  Glucose, capillary      Status: Abnormal   Collection Time: 02/11/20  7:39 AM  Result Value Ref Range   Glucose-Capillary 140 (H) 70 - 99 mg/dL    Comment: Glucose reference range applies only to samples taken after fasting for at least 8 hours.  Glucose, capillary     Status: Abnormal   Collection Time: 02/11/20 11:33 AM  Result Value Ref Range   Glucose-Capillary 137 (H) 70 - 99 mg/dL    Comment: Glucose reference range applies only to samples taken after fasting for at least 8 hours.  Glucose, capillary     Status: Abnormal   Collection Time: 02/11/20  5:42 PM  Result Value Ref Range   Glucose-Capillary 136 (H) 70 - 99 mg/dL    Comment: Glucose reference range applies only to samples taken after fasting for at least 8 hours.  Glucose, capillary     Status: Abnormal   Collection Time: 02/11/20  8:15 PM  Result Value Ref Range   Glucose-Capillary 209 (H) 70 - 99 mg/dL    Comment: Glucose reference range applies only to samples taken after fasting for at least 8 hours.  Glucose, capillary     Status: Abnormal   Collection Time: 02/11/20  8:26 PM  Result Value Ref Range   Glucose-Capillary 206 (H) 70 - 99 mg/dL    Comment: Glucose reference range applies only to samples taken after fasting for at least 8 hours.  Glucose, capillary     Status: Abnormal   Collection Time: 02/12/20 12:34 AM  Result Value Ref Range   Glucose-Capillary 152 (H) 70 - 99  mg/dL    Comment: Glucose reference range applies only to samples taken after fasting for at least 8 hours.  CBC with Differential/Platelet     Status: Abnormal   Collection Time: 02/12/20  4:28 AM  Result Value Ref Range   WBC 12.9 (H) 4.0 - 10.5 K/uL   RBC 7.18 (H) 4.22 - 5.81 MIL/uL   Hemoglobin 15.6 13.0 - 17.0 g/dL   HCT 74.8 39 - 52 %   MCV 70.9 (L) 80.0 - 100.0 fL   MCH 21.7 (L) 26.0 - 34.0 pg   MCHC 30.6 30.0 - 36.0 g/dL   RDW 27.0 (H) 78.6 - 75.4 %   Platelets 394 150 - 400 K/uL   nRBC 0.0 0.0 - 0.2 %   Neutrophils Relative % 64 %   Neutro Abs  8.2 (H) 1.7 - 7.7 K/uL   Lymphocytes Relative 17 %   Lymphs Abs 2.2 0.7 - 4.0 K/uL   Monocytes Relative 10 %   Monocytes Absolute 1.3 (H) 0.1 - 1.0 K/uL   Eosinophils Relative 6 %   Eosinophils Absolute 0.8 (H) 0.0 - 0.5 K/uL   Basophils Relative 2 %   Basophils Absolute 0.3 (H) 0.0 - 0.1 K/uL   WBC Morphology MORPHOLOGY UNREMARKABLE    RBC Morphology MORPHOLOGY UNREMARKABLE    Smear Review Normal platelet morphology    Immature Granulocytes 1 %   Abs Immature Granulocytes 0.16 (H) 0.00 - 0.07 K/uL    Comment: Performed at [facility], [address] [place], [place] [address]  Comprehensive metabolic panel     Status: Abnormal   Collection Time: 02/12/20  4:28 AM  Result Value Ref Range   Sodium 139 135 - 145 mmol/L   Potassium 4.0 3.5 - 5.1 mmol/L   Chloride 109 98 - 111 mmol/L   CO2 21 (L) 22 - 32 mmol/L   Glucose, Bld 109 (H) 70 - 99 mg/dL    Comment: Glucose reference range applies only to samples taken after fasting for at least 8 hours.   BUN 28 (H) 8 - 23 mg/dL   Creatinine, Ser 0.07 0.61 - 1.24 mg/dL   Calcium 8.9 8.9 - 12.1 mg/dL   Total Protein 7.0 6.5 - 8.1 g/dL   Albumin 3.2 (L) 3.5 - 5.0 g/dL   AST 17 15 - 41 U/L   ALT 11 0 - 44 U/L   Alkaline Phosphatase 57 38 - 126 U/L   Total Bilirubin 1.0 0.3 - 1.2 mg/dL   GFR, Estimated >97 >58 mL/min    Comment: (NOTE) Calculated using the CKD-EPI Creatinine Equation (2021)    Anion gap 9 5 - 15    Comment: Performed at [facility], [address] [place], [place] [address]  Magnesium     Status: None   Collection Time: 02/12/20  4:28 AM  Result Value Ref Range   Magnesium 1.8 1.7 - 2.4 mg/dL    Comment: Performed at [facility], [address], [place], [place] [address]  Phosphorus     Status: None   Collection Time: 02/12/20  4:28 AM  Result Value Ref Range   Phosphorus 3.2 2.5 - 4.6 mg/dL    Comment: Performed at [facility], [address] [place], [place]  [address]  Glucose, capillary     Status: Abnormal   Collection Time: 02/12/20  4:48 AM  Result Value Ref Range   Glucose-Capillary 122 (H) 70 - 99 mg/dL    Comment: Glucose reference range  applies only to samples taken after fasting for at least 8 hours.  Glucose, capillary     Status: Abnormal   Collection Time: 02/12/20  8:00 AM  Result Value Ref Range   Glucose-Capillary 119 (H) 70 - 99 mg/dL    Comment: Glucose reference range applies only to samples taken after fasting for at least 8 hours.   Comment 1 Notify RN   Glucose, capillary     Status: Abnormal   Collection Time: 02/12/20 12:05 PM  Result Value Ref Range   Glucose-Capillary 191 (H) 70 - 99 mg/dL    Comment: Glucose reference range applies only to samples taken after fasting for at least 8 hours.   Comment 1 Notify RN   Glucose, capillary     Status: Abnormal   Collection Time: 02/12/20  4:32 PM  Result Value Ref Range   Glucose-Capillary 167 (H) 70 - 99 mg/dL    Comment: Glucose reference range applies only to samples taken after fasting for at least 8 hours.   Comment 1 Notify RN     Current Facility-Administered Medications  Medication Dose Route Frequency Provider Last Rate Last Admin  . 0.9 %  sodium chloride infusion   Intravenous PRN Vida Rigger, MD   Stopped at 02/05/20 1043  . acetaminophen (TYLENOL) tablet 650 mg  650 mg Oral Q4H PRN Kasa, Kurian, MD      . albuterol (VENTOLIN HFA) 108 (90 Base) MCG/ACT inhaler 2 puff  2 puff Inhalation Q4H PRN Lorine Bears A, MD      . allopurinol (ZYLOPRIM) tablet 300 mg  300 mg Oral Daily Erin Fulling, MD   300 mg at 02/12/20 1017  . amiodarone (PACERONE) tablet 200 mg  200 mg Oral BID Lorine Bears A, MD   200 mg at 02/12/20 1018  . apixaban (ELIQUIS) tablet 5 mg  5 mg Oral BID Vida Rigger, MD   5 mg at 02/12/20 1018  . atorvastatin (LIPITOR) tablet 40 mg  40 mg Oral QHS Erin Fulling, MD   40 mg at 02/11/20 2231  . bisacodyl (DULCOLAX) suppository 10 mg  10 mg  Rectal Daily PRN Pokhrel, Laxman, MD   10 mg at 02/08/20 0619  . carvedilol (COREG) tablet 6.25 mg  6.25 mg Oral BID WC Debbe Odea, MD   6.25 mg at 02/12/20 1659  . chlorhexidine (PERIDEX) 0.12 % solution 15 mL  15 mL Mouth Rinse BID Rust-Chester, Britton L, NP   15 mL at 02/12/20 1019  . Chlorhexidine Gluconate Cloth 2 % PADS 6 each  6 each Topical Daily Iran Ouch, MD   6 each at 02/12/20 0900  . docusate (COLACE) 50 MG/5ML liquid 100 mg  100 mg Oral BID Erin Fulling, MD   100 mg at 02/12/20 1017  . famotidine (PEPCID) tablet 20 mg  20 mg Oral Daily Sharen Hones, RPH   20 mg at 02/12/20 1017  . feeding supplement (ENSURE ENLIVE / ENSURE PLUS) liquid 237 mL  237 mL Oral TID BM Pokhrel, Laxman, MD   237 mL at 02/12/20 1430  . furosemide (LASIX) tablet 20 mg  20 mg Oral Daily Eula Listen M, PA-C   20 mg at 02/12/20 1018  . insulin aspart (novoLOG) injection 0-9 Units  0-9 Units Subcutaneous Q4H Iran Ouch, MD   2 Units at 02/12/20 1703  . MEDLINE mouth rinse  15 mL Mouth Rinse q12n4p Rust-Chester, Britton L, NP   15 mL at 02/12/20 1703  . melatonin  tablet 5 mg  5 mg Oral QHS Vida Rigger, MD   5 mg at 02/11/20 2231  . metoprolol tartrate (LOPRESSOR) injection 5 mg  5 mg Intravenous Q2H PRN Iran Ouch, MD   5 mg at 01/25/20 1259  . multivitamin with minerals tablet 1 tablet  1 tablet Oral Daily Pokhrel, Laxman, MD   1 tablet at 02/12/20 1017  . [START ON 02/13/2020] OLANZapine zydis (ZYPREXA) disintegrating tablet 5 mg  5 mg Oral BID Lauralynn Loeb T, MD      . ondansetron (ZOFRAN) injection 4 mg  4 mg Intravenous Q6H PRN Lorine Bears A, MD      . polyethylene glycol (MIRALAX / GLYCOLAX) packet 17 g  17 g Oral Daily Erin Fulling, MD   17 g at 02/09/20 1013  . polyethylene glycol (MIRALAX / GLYCOLAX) packet 17 g  17 g Oral Daily PRN Erin Fulling, MD      . potassium chloride (KLOR-CON) packet 20 mEq  20 mEq Oral Daily Sharen Hones, RPH   20 mEq at 02/12/20 1016   . sacubitril-valsartan (ENTRESTO) 24-26 mg per tablet  1 tablet Oral BID End, Christopher, MD   1 tablet at 02/12/20 1017  . senna-docusate (Senokot-S) tablet 1 tablet  1 tablet Oral BID Erin Fulling, MD   1 tablet at 02/12/20 1018  . sodium chloride flush (NS) 0.9 % injection 10-40 mL  10-40 mL Intracatheter Q12H Lorine Bears A, MD   10 mL at 02/12/20 1308  . sodium chloride flush (NS) 0.9 % injection 3 mL  3 mL Intravenous Q12H Lorine Bears A, MD   3 mL at 02/10/20 2230  . spironolactone (ALDACTONE) tablet 12.5 mg  12.5 mg Oral Daily Debbe Odea, MD   12.5 mg at 02/12/20 1018    Musculoskeletal: Strength & Muscle Tone: decreased Gait & Station: unable to stand Patient leans: Right  Psychiatric Specialty Exam: Physical Exam Vitals reviewed.  HENT:     Head: Normocephalic and atraumatic.  Eyes:     Conjunctiva/sclera: Conjunctivae normal.     Pupils: Pupils are equal, round, and reactive to light.  Cardiovascular:     Heart sounds: Normal heart sounds.  Pulmonary:     Effort: Pulmonary effort is normal.  Abdominal:     Palpations: Abdomen is soft.  Musculoskeletal:        General: Normal range of motion.     Cervical back: Normal range of motion.  Skin:    General: Skin is warm and dry.  Neurological:     Mental Status: He is alert.  Psychiatric:        Attention and Perception: He is inattentive.        Mood and Affect: Affect is blunt.        Speech: Speech is delayed.     Review of Systems  Constitutional: Positive for chills.  HENT: Negative.   Eyes: Negative.   Respiratory: Negative.   Cardiovascular: Negative.   Gastrointestinal: Negative.   Musculoskeletal: Negative.   Skin: Negative.   Neurological: Negative.   Psychiatric/Behavioral: Negative.     Blood pressure 92/61, pulse 62, temperature 97.6 F (36.4 C), temperature source Axillary, resp. rate 20, height 5' 7.01" (1.702 m), weight 72.6 kg, SpO2 100 %.Body mass index is 25.05 kg/m.   General Appearance: Casual  Eye Contact:  Minimal  Speech:  Slow  Volume:  Decreased  Mood:  Euthymic  Affect:  Congruent  Thought Process:  Coherent  Orientation:  Negative  Thought Content:  Negative  Suicidal Thoughts:  No  Homicidal Thoughts:  No  Memory:  Negative  Judgement:  Negative  Insight:  Negative  Psychomotor Activity:  Decreased  Concentration:  Concentration: Poor  Recall:  Poor  Fund of Knowledge:  Poor  Language:  Poor  Akathisia:  Negative  Handed:  Right  AIMS (if indicated):     Assets:  Social Support  ADL's:  Impaired  Cognition:  Impaired,  Moderate  Sleep:        Treatment Plan Summary: Plan I am going to continue with tapering down the olanzapine to 5 mg twice a day.  I see that he still has a one-to-one Comptroller.  Nursing can decide if he still needs that if he is not having consistent agitation perhaps that can be discontinued.  Disposition: Patient does not meet criteria for psychiatric inpatient admission. Supportive therapy provided about ongoing stressors.  Mordecai Rasmussen, MD 02/12/2020 5:22 PM

## 2020-02-12 NOTE — [place] FL2 (Signed)
[place] MEDICAID FL2 LEVEL OF CARE SCREENING TOOL     IDENTIFICATION  Patient Name: Travis Maxwell Birthdate: October 08, 1950 Sex: male Admission Date (Current Location): 01/06/2020  [place] and [place] Number:  Chiropodist and Address:  [facility], [address] [place], [place] [address]      Provider Number: [id number]  Attending Physician Name and Address:  Fran Lowes, DO  Relative Name and Phone Number:  [name], daughter [phone]    Current Level of Care: Hospital Recommended Level of Care: Skilled Nursing Facility Prior Approval Number:    Date Approved/Denied:   PASRR Number: [id number]  Discharge Plan: SNF    Current Diagnoses: Patient Active Problem List   Diagnosis Date Noted  . Atrial fibrillation with RVR (HCC)   . Ventricular tachycardia (HCC)   . Pressure injury of skin 01/21/2020  . Delirium   . Adult failure to thrive   . Palliative care by specialist   . DNR (do not resuscitate) discussion   . Malnutrition of moderate degree 01/07/2020  . Respiratory failure (HCC) 01/06/2020  . Elevated troponin 11/30/2019  . Leukocytosis 11/30/2019  . PAF (paroxysmal atrial fibrillation) (HCC) 11/30/2019  . Acute on chronic systolic congestive heart failure (HCC) 11/30/2019  . Pneumonia due to COVID-19 virus 11/30/2019  . Chest pain 08/13/2019  . Diabetes mellitus without complication (HCC)   . Gout   . Hypertension   . Dizziness   . Acute on chronic systolic CHF (congestive heart failure) (HCC)   . Tobacco abuse   . GERD (gastroesophageal reflux disease)   . CAD (coronary artery disease)   . NSTEMI (non-ST elevated myocardial infarction) (HCC) 07/24/2017    Orientation RESPIRATION BLADDER Height & Weight     Self, Time  Normal Continent Weight: 72.6 kg Height:  5' 7.01" (170.2 cm)  BEHAVIORAL SYMPTOMS/MOOD NEUROLOGICAL BOWEL NUTRITION STATUS      Continent Diet  AMBULATORY STATUS COMMUNICATION OF NEEDS  Skin   Limited Assist Verbally Normal                       Personal Care Assistance Level of Assistance  Bathing, Feeding, Dressing Bathing Assistance: Limited assistance Feeding assistance: Limited assistance Dressing Assistance: Limited assistance     Functional Limitations Info             SPECIAL CARE FACTORS FREQUENCY  PT (By licensed PT), OT (By licensed OT)     PT Frequency: 5x week OT Frequency: 5x week            Contractures Contractures Info: Not present    Additional Factors Info  Allergies, Code Status Code Status Info: Full Allergies Info: Penicillin           Current Medications (02/12/2020):  This is the current hospital active medication list Current Facility-Administered Medications  Medication Dose Route Frequency Provider Last Rate Last Admin  . 0.9 %  sodium chloride infusion   Intravenous PRN Vida Rigger, MD   Stopped at 02/05/20 1043  . acetaminophen (TYLENOL) tablet 650 mg  650 mg Oral Q4H PRN Kasa, Kurian, MD      . albuterol (VENTOLIN HFA) 108 (90 Base) MCG/ACT inhaler 2 puff  2 puff Inhalation Q4H PRN Lorine Bears A, MD      . allopurinol (ZYLOPRIM) tablet 300 mg  300 mg Oral Daily Erin Fulling, MD   300 mg at 02/12/20 1017  . amiodarone (PACERONE) tablet 200 mg  200 mg Oral BID  Iran Ouch, MD   200 mg at 02/12/20 1018  . apixaban (ELIQUIS) tablet 5 mg  5 mg Oral BID Vida Rigger, MD   5 mg at 02/12/20 1018  . atorvastatin (LIPITOR) tablet 40 mg  40 mg Oral QHS Erin Fulling, MD   40 mg at 02/11/20 2231  . bisacodyl (DULCOLAX) suppository 10 mg  10 mg Rectal Daily PRN Pokhrel, Laxman, MD   10 mg at 02/08/20 0619  . carvedilol (COREG) tablet 6.25 mg  6.25 mg Oral BID WC Debbe Odea, MD   6.25 mg at 02/12/20 1018  . chlorhexidine (PERIDEX) 0.12 % solution 15 mL  15 mL Mouth Rinse BID Rust-Chester, Britton L, NP   15 mL at 02/12/20 1019  . Chlorhexidine Gluconate Cloth 2 % PADS 6 each  6 each Topical Daily Iran Ouch, MD   6 each at 02/12/20 0900  . docusate (COLACE) 50 MG/5ML liquid 100 mg  100 mg Oral BID Erin Fulling, MD   100 mg at 02/12/20 1017  . famotidine (PEPCID) tablet 20 mg  20 mg Oral Daily Sharen Hones, RPH   20 mg at 02/12/20 1017  . feeding supplement (ENSURE ENLIVE / ENSURE PLUS) liquid 237 mL  237 mL Oral TID BM Pokhrel, Laxman, MD   237 mL at 02/12/20 1019  . furosemide (LASIX) tablet 20 mg  20 mg Oral Daily Eula Listen M, PA-C   20 mg at 02/12/20 1018  . insulin aspart (novoLOG) injection 0-9 Units  0-9 Units Subcutaneous Q4H Iran Ouch, MD   2 Units at 02/12/20 1307  . MEDLINE mouth rinse  15 mL Mouth Rinse q12n4p Rust-Chester, Britton L, NP   15 mL at 02/11/20 1655  . melatonin tablet 5 mg  5 mg Oral QHS Vida Rigger, MD   5 mg at 02/11/20 2231  . metoprolol tartrate (LOPRESSOR) injection 5 mg  5 mg Intravenous Q2H PRN Iran Ouch, MD   5 mg at 01/25/20 1259  . multivitamin with minerals tablet 1 tablet  1 tablet Oral Daily Pokhrel, Laxman, MD   1 tablet at 02/12/20 1017  . OLANZapine zydis (ZYPREXA) disintegrating tablet 5 mg  5 mg Oral TID Clapacs, Jackquline Denmark, MD   5 mg at 02/12/20 1020  . ondansetron (ZOFRAN) injection 4 mg  4 mg Intravenous Q6H PRN Lorine Bears A, MD      . polyethylene glycol (MIRALAX / GLYCOLAX) packet 17 g  17 g Oral Daily Erin Fulling, MD   17 g at 02/09/20 1013  . polyethylene glycol (MIRALAX / GLYCOLAX) packet 17 g  17 g Oral Daily PRN Erin Fulling, MD      . potassium chloride (KLOR-CON) packet 20 mEq  20 mEq Oral Daily Sharen Hones, RPH   20 mEq at 02/12/20 1016  . sacubitril-valsartan (ENTRESTO) 24-26 mg per tablet  1 tablet Oral BID End, Christopher, MD   1 tablet at 02/12/20 1017  . senna-docusate (Senokot-S) tablet 1 tablet  1 tablet Oral BID Erin Fulling, MD   1 tablet at 02/12/20 1018  . sodium chloride flush (NS) 0.9 % injection 10-40 mL  10-40 mL Intracatheter Q12H Lorine Bears A, MD   10 mL at 02/12/20 1308  . sodium  chloride flush (NS) 0.9 % injection 3 mL  3 mL Intravenous Q12H Lorine Bears A, MD   3 mL at 02/10/20 2230  . spironolactone (ALDACTONE) tablet 12.5 mg  12.5 mg Oral Daily Debbe Odea, MD  12.5 mg at 02/12/20 1018     Discharge Medications: Please see discharge summary for a list of discharge medications.  Relevant Imaging Results:  Relevant Lab Results:   Additional Information    Hetty Ely, RN

## 2020-02-12 NOTE — Progress Notes (Signed)
PROGRESS NOTE    Travis Maxwell  VPX:[id number]  DOB: 1950-04-03  DOA: 01/06/2020 PCP: [facility]ists:   Hospital course:  69 year old African-American male with past medical history of hypertension, diabetes mellitus type 2, hyperlipidemia, GERD, coronary artery disease status post PCI, history of aortic valve replacement on Eliquis presented to the hospital with respiratory distress, chest pain and shortness of breath on 01/06/2020.  He also had orthopnea and leg swelling.  Initially, patient was put on BiPAP but his breathing status worsened so he was emergently intubated and admitted to the hospital.  Patient was noted to be in severe cardiogenic shock on 01/07/2020.  Vasopressors were weaned off on 11/ 6/21 and patient was extubated to BiPAP at that time but patient was subsequently intubated on 01/19/2020.  Patient also had severe metabolic acidosis.  EKG showed some concerns for ST elevation but did not meet ST elevation MI criteria as per cardiology.  Patient received IV Cardizem and have a borderline elevated troponins.  BNP on admission was 1046.  Patient had a chest x-ray which showed vascular congestion.  Of note, patient has history of Covid pneumonia on 12/04/2019.  Patient remained extubated and was off pressors on 01/29/20 and was subsequently considered for transfer to medical service.  Patient has remained encephalopathic with a poor oral intake.  Patient was started on NG tube and tube feeding was started on 02/03/2020.  Has had episodes of agitation and confusion requiring sedation in 1-1 sitter. Today he is resting comfortably without a sitter.   Subjective:  Patient is sleeping soundly. He is easily rouseable.  Objective: Vitals:   02/12/20 0446 02/12/20 0756 02/12/20 1145 02/12/20 1602  BP: 99/66 100/69 116/68 92/61  Pulse: 66 74 85 62  Resp: 20 18 20 20   Temp: 97.8 F (36.6 C) 97.8 F (36.6 C) 98.3 F (36.8 C) 97.6 F (36.4  C)  TempSrc: Oral Axillary Axillary Axillary  SpO2: 99% 98% 97% 100%  Weight:      Height:        Intake/Output Summary (Last 24 hours) at 02/12/2020 1817 Last data filed at 02/12/2020 1352 Gross per 24 hour  Intake --  Output 300 ml  Net -300 ml   Filed Weights   02/11/20 0416 02/12/20 0028 02/12/20 0359  Weight: 75.8 kg 72.6 kg 72.6 kg   Exam:  Constitutional:  . The patient is sleeping soundly.  No acute distress. Respiratory:  . No increased work of breathing. . No wheezes, rales, or rhonchi . No tactile fremitus Cardiovascular:  . Regular rate and rhythm . No murmurs, ectopy, or gallups. . No lateral PMI. No thrills. Abdomen:  . Abdomen is soft, non-tender, non-distended . No hernias, masses, or organomegaly . Normoactive bowel sounds.  Musculoskeletal:  . No cyanosis, clubbing, or edema Skin:  . No rashes, lesions, ulcers . palpation of skin: no induration or nodules Neurologic:  . Unable to evaluate as the patient is unable to cooperate with exam. Psychiatric:  Pt is calm and cooperative today.   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 QTC remains prolonged at 593.  Cardiology has signed off.   HFrEF Nonischemic cardiomyopathy, EF of 20 to 25% Cardiac catheterization 01/24/2020 with nonobstructive disease Continue diuresis, beta-blockers and Entresto. Cardiology has signed off.   S/P episode of VT subsequent cardiogenic shock Status post cardioversion Presently on oral  amiodarone Travis Maxwell is to keep potassium greater than 4 and magnesium greater than 2. Cardiology has signed off.   PAF On Coreg and amiodarone Continue Eliquis Cardiology has signed off.   S/P Pneumonia Patient is status post treatment for Pseudomonas and MRSA pneumonia per tracheal culture. Completed antibiotics 01/28/2020.  Moderate  protein calorie malnutrition Due to decreased oral intake Per patient's daughter, no tube feedings are warranted, she would like to have her father eat by himself. On dysphagia 1 diet Nutrition Status: Nutrition Problem: Moderate Malnutrition Etiology: chronic illness (CHF) Signs/Symptoms: moderate fat depletion, moderate muscle depletion Interventions: Tube feeding, MVI  Stage II sacral decub Continue wound care  I have seen and examined this patient myself. I have spent 32 minutes in his evaluation and care.  DVT prophylaxis: On Eliquis Code Status: Full Family Communication: SNF Disposition Plan:   Patient is from: Home  Anticipated Discharge Location: SNF  Barriers to Discharge: Placement, persistent encephalopathy with aggression and agitation  Is patient medically stable for Discharge: No   Consultants:  PCCM  Cardiology  Palliative care  Psychiatry  Procedures:  Status post intubation and extubation  Cardiac catheterization 01/14/2020  NGT placement 02/02/2020  Data Reviewed:  Basic Metabolic Panel: Recent Labs  Lab 02/06/20 0406 02/07/20 0550 02/08/20 0533 02/09/20 0538 02/11/20 0637 02/12/20 0428  NA 137 138 138  --  140 139  K 4.1 4.5 4.2  --  4.3 4.0  CL 104 104 103  --  106 109  CO2 23 22 21*  --  23 21*  GLUCOSE 121* 93 109*  --  126* 109*  BUN 14 17 18   --  28* 28*  CREATININE 0.96 0.94 0.94  --  1.22 0.99  CALCIUM 9.1 9.6 9.7  --  9.6 8.9  MG 1.8 1.9 2.1  --  2.0 1.8  PHOS 4.1  --   --  4.0  --  3.2   Liver Function Tests: Recent Labs  Lab 02/12/20 0428  AST 17  ALT 11  ALKPHOS 57  BILITOT 1.0  PROT 7.0  ALBUMIN 3.2*   No results for input(s): LIPASE, AMYLASE in the last 168 hours. No results for input(s): AMMONIA in the last 168 hours. CBC: Recent Labs  Lab 02/08/20 0533 02/09/20 0538 02/10/20 0527 02/11/20 0637 02/12/20 0428  WBC 11.8* 12.3* 15.5* 13.2* 12.9*  NEUTROABS 7.1 8.1* 9.8* 8.6* 8.2*  HGB 16.1 16.7 16.5  16.3 15.6  HCT 52.1* 54.3* 53.3* 53.0* 50.9  MCV 70.3* 70.3* 71.2* 71.3* 70.9*  PLT 693* 611* 582* 525* 394   Cardiac Enzymes: No results for input(s): CKTOTAL, CKMB, CKMBINDEX, TROPONINI in the last 168 hours. BNP (last 3 results) No results for input(s): PROBNP in the last 8760 hours. CBG: Recent Labs  Lab 02/12/20 0034 02/12/20 0448 02/12/20 0800 02/12/20 1205 02/12/20 1632  GLUCAP 152* 122* 119* 191* 167*    No results found for this or any previous visit (from the past 240 hour(s)).    Studies: No results found.   Scheduled Meds: . allopurinol  300 mg Oral Daily  . amiodarone  200 mg Oral BID  . apixaban  5 mg Oral BID  . atorvastatin  40 mg Oral QHS  . carvedilol  6.25 mg Oral BID WC  . chlorhexidine  15 mL Mouth Rinse BID  . Chlorhexidine Gluconate Cloth  6 each Topical Daily  . docusate  100 mg Oral BID  . famotidine  20 mg Oral Daily  . feeding supplement  237 mL Oral TID BM  . furosemide  20 mg Oral Daily  . insulin aspart  0-9 Units Subcutaneous Q4H  . mouth rinse  15 mL Mouth Rinse q12n4p  . melatonin  5 mg Oral QHS  . multivitamin with minerals  1 tablet Oral Daily  . [START ON 02/13/2020] OLANZapine zydis  5 mg Oral BID  . polyethylene glycol  17 g Oral Daily  . potassium chloride  20 mEq Oral Daily  . sacubitril-valsartan  1 tablet Oral BID  . senna-docusate  1 tablet Oral BID  . sodium chloride flush  10-40 mL Intracatheter Q12H  . sodium chloride flush  3 mL Intravenous Q12H  . spironolactone  12.5 mg Oral Daily   Continuous Infusions: . sodium chloride Stopped (02/05/20 1043)    Principal Problem:   Delirium Active Problems:   Acute on chronic systolic congestive heart failure (HCC)   Respiratory failure (HCC)   Malnutrition of moderate degree   Adult failure to thrive   Palliative care by specialist   DNR (do not resuscitate) discussion   Pressure injury of skin   Ventricular tachycardia (HCC)   Atrial fibrillation with RVR  (HCC)   Travis Maxwell, [facility]  If 7PM-7AM, please contact night-coverage [email or web address] Password [email or web address] 02/12/2020, 6:17 PM    LOS: 37 days

## 2020-02-12 NOTE — Progress Notes (Signed)
Nutrition Follow-up  DOCUMENTATION CODES:   Non-severe (moderate) malnutrition in context of chronic illness  INTERVENTION:   Continue Ensure Enlive po TID, each supplement provides 350 kcal and 20 grams of protein  MVI daily  NUTRITION DIAGNOSIS:   Moderate Malnutrition related to chronic illness (CHF) as evidenced by moderate fat depletion, moderate muscle depletion. Ongoing.  GOAL:   Patient will meet greater than or equal to 90% of their needs -progressing   MONITOR:   PO intake, Supplement acceptance, Labs, Weight trends, Skin, I & O's  ASSESSMENT:   69 year old male with PMHx of HTN, DM, gout, hx AVR with bioprosthetic valve, GERD, CAD, CHF (EF 20%), recent COVID-19 11/30/2019 admitted with respiratory failure, severe metabolic acidosis, CHF exacerbation.   Pt continues to have fairly good appetite and oral intake in hospital; pt documented to be eating anywhere from 25-100% of meals and is drinking most of his Ensure supplements. RN reports that pt ate 50% of his breakfast this morning and drank some Ensure. Pt continues to have AMS. Per chart, pt has remained fairly weight stable over the past month. Plan is for SNF at discharge.   Medications reviewed and include: allopurinol, colace, lasix, insulin, MVI, melatonin, KCl, senokot, aldactone, pepcid  Labs reviewed: BUN 28(H), K 4.0 wnl, P 3.2 wnl, Mg 1.8 wnl Wbc- 12.9(H) cbgs- 152, 122, 119 x 24 hrs  Diet Order:   Diet Order            DIET - DYS 1 Room service appropriate? Yes with Assist; Fluid consistency: Thin  Diet effective now                EDUCATION NEEDS:   No education needs have been identified at this time  Skin:  Skin Assessment: Skin Integrity Issues: Skin Integrity Issues:: Stage II Stage II: sacrum (2.5cm x 1cm)  Last BM:  12/8- TYPE 4  Height:   Ht Readings from Last 1 Encounters:  01/24/20 5' 7.01" (1.702 m)   Weight:   Wt Readings from Last 1 Encounters:  02/12/20 72.6 kg    Ideal Body Weight:  67.3 kg  BMI:  Body mass index is 25.05 kg/m.  Estimated Nutritional Needs:   Kcal:  2000-2200  Protein:  110-120 grams  Fluid:  1.5-1.8 L/day or per MD  Betsey Holiday MS, RD, LDN Please refer to [facility] for RD and/or RD on-call/weekend/after hours pager

## 2020-02-13 ENCOUNTER — Inpatient Hospital Stay: Payer: Medicare HMO

## 2020-02-13 LAB — CBC WITH DIFFERENTIAL/PLATELET
Abs Immature Granulocytes: 0.09 10*3/uL — ABNORMAL HIGH (ref 0.00–0.07)
Basophils Absolute: 0.3 10*3/uL — ABNORMAL HIGH (ref 0.0–0.1)
Basophils Relative: 3 %
Eosinophils Absolute: 0.8 10*3/uL — ABNORMAL HIGH (ref 0.0–0.5)
Eosinophils Relative: 7 %
HCT: 52.7 % — ABNORMAL HIGH (ref 39.0–52.0)
Hemoglobin: 16.2 g/dL (ref 13.0–17.0)
Immature Granulocytes: 1 %
Lymphocytes Relative: 20 %
Lymphs Abs: 2.3 10*3/uL (ref 0.7–4.0)
MCH: 21.7 pg — ABNORMAL LOW (ref 26.0–34.0)
MCHC: 30.7 g/dL (ref 30.0–36.0)
MCV: 70.7 fL — ABNORMAL LOW (ref 80.0–100.0)
Monocytes Absolute: 1 10*3/uL (ref 0.1–1.0)
Monocytes Relative: 9 %
Neutro Abs: 7.1 10*3/uL (ref 1.7–7.7)
Neutrophils Relative %: 60 %
Platelets: 405 10*3/uL — ABNORMAL HIGH (ref 150–400)
RBC: 7.45 MIL/uL — ABNORMAL HIGH (ref 4.22–5.81)
RDW: 27.4 % — ABNORMAL HIGH (ref 11.5–15.5)
Smear Review: NORMAL
WBC: 11.6 10*3/uL — ABNORMAL HIGH (ref 4.0–10.5)
nRBC: 0 % (ref 0.0–0.2)

## 2020-02-13 LAB — GLUCOSE, CAPILLARY
Glucose-Capillary: 124 mg/dL — ABNORMAL HIGH (ref 70–99)
Glucose-Capillary: 127 mg/dL — ABNORMAL HIGH (ref 70–99)
Glucose-Capillary: 223 mg/dL — ABNORMAL HIGH (ref 70–99)
Glucose-Capillary: 101 mg/dL — ABNORMAL HIGH (ref 70–99)
Glucose-Capillary: 234 mg/dL — ABNORMAL HIGH (ref 70–99)

## 2020-02-13 MED ORDER — NICOTINE 21 MG/24HR TD PT24
21.0000 mg | MEDICATED_PATCH | Freq: Every day | TRANSDERMAL | Status: DC
  Administered 2020-02-13 – 2020-02-20 (×8): 21 mg via TRANSDERMAL
  Filled 2020-02-13 (×8): qty 1

## 2020-02-13 MED ORDER — LORAZEPAM 2 MG/ML IJ SOLN
1.0000 mg | Freq: Once | INTRAMUSCULAR | Status: AC
  Administered 2020-02-14: 1 mg via INTRAVENOUS
  Filled 2020-02-13: qty 1

## 2020-02-13 NOTE — Progress Notes (Signed)
   02/13/20 1632  What Happened  Was fall witnessed? Yes  Who witnessed fall? Arlisha  Patients activity before fall ambulating-unassisted  Point of contact buttocks;head  Was patient injured? No  Follow Up  MD notified Yes  Time MD notified 1640  Family notified Yes - comment  Time family notified 1650  Additional tests Yes-comment (head CT)  Progress note created (see row info) Yes  Adult Fall Risk Assessment  Risk Factor Category (scoring not indicated) Fall has occurred during this admission (document High fall risk)  Patient Fall Risk Level High fall risk  Adult Fall Risk Interventions  Required Bundle Interventions *See Row Information* High fall risk - low, moderate, and high requirements implemented  Additional Interventions Use of appropriate toileting equipment (bedpan, BSC, etc.);Room near nurses station;Reorient/diversional activities with confused patients  Screening for Fall Injury Risk (To be completed on HIGH fall risk patients) - Assessing Need for Low Bed  Risk For Fall Injury- Low Bed Criteria Previous fall this admission  Will Implement Low Bed and Floor Mats Yes  Screening for Fall Injury Risk (To be completed on HIGH fall risk patients who do not meet crieteria for Low Bed) - Assessing Need for Floor Mats Only  Risk For Fall Injury- Criteria for Floor Mats Bleeding risk-anticoagulation (not prophylaxis);Confusion/dementia (+NuDESC, CIWA, TBI, etc.)  Will Implement Floor Mats Yes  Vitals  Temp 98.2 F (36.8 C)  Temp Source Oral  BP 103/78  MAP (mmHg) 87  BP Location Right Arm  BP Method Automatic  Patient Position (if appropriate) Sitting  Pulse Rate 63  Pulse Rate Source Monitor  Resp 18  Oxygen Therapy  SpO2 100 %  O2 Device Room Air  Pain Assessment  Pain Scale 0-10  Pain Score 0  Faces Pain Scale 0  Neurological  Neuro (WDL) X  Level of Consciousness Alert  Orientation Level Oriented to person;Disoriented to place;Disoriented to  time;Disoriented to situation  Cognition Poor attention/concentration;Poor judgement;Poor safety awareness;Impulsive  Speech Clear  Pupil Assessment  Yes  R Pupil Size (mm) 3  R Pupil Shape Round  R Pupil Reaction Brisk  L Pupil Size (mm) 3  L Pupil Shape Round  L Pupil Reaction Brisk  Neuro Symptoms Forgetful  Glasgow Coma Scale  Eye Opening 4  Best Verbal Response (NON-intubated) 4  Best Motor Response 6  Glasgow Coma Scale Score 14  Musculoskeletal  Musculoskeletal (WDL) X  Assistive Device Front wheel walker  Generalized Weakness Yes  Integumentary  Integumentary (WDL) X (no change from previous)  Skin Color Appropriate for ethnicity  Skin Condition Dry;Flaky

## 2020-02-13 NOTE — Progress Notes (Signed)
PROGRESS NOTE    Kashmir Lysaght  HCW:[id number]  DOB: May 06, 1950  DOA: 01/06/2020 PCP: [facility]:   Hospital course:  69 year old African-American male with past medical history of hypertension, diabetes mellitus type 2, hyperlipidemia, GERD, coronary artery disease status post PCI, history of aortic valve replacement on Eliquis presented to the hospital with respiratory distress, chest pain and shortness of breath on 01/06/2020.  He also had orthopnea and leg swelling.  Initially, patient was put on BiPAP but his breathing status worsened so he was emergently intubated and admitted to the hospital.  Patient was noted to be in severe cardiogenic shock on 01/07/2020.  Vasopressors were weaned off on 11/ 6/21 and patient was extubated to BiPAP at that time but patient was subsequently intubated on 01/19/2020.  Patient also had severe metabolic acidosis.  EKG showed some concerns for ST elevation but did not meet ST elevation MI criteria as per cardiology.  Patient received IV Cardizem and have a borderline elevated troponins.  BNP on admission was 1046.  Patient had a chest x-ray which showed vascular congestion.  Of note, patient has history of Covid pneumonia on 12/04/2019.  Patient remained extubated and was off pressors on 01/29/20 and was subsequently considered for transfer to medical service.  Patient has remained encephalopathic with a poor oral intake.  Patient was started on NG tube and tube feeding was started on 02/03/2020.  Has had episodes of agitation and confusion requiring sedation in 1-1 sitter. Today he is resting comfortably without a sitter.   Subjective:  Patient is awake and calm. No new complaints.  Objective: Vitals:   02/13/20 0316 02/13/20 0421 02/13/20 0919 02/13/20 1150  BP:  98/77 104/66 92/65  Pulse:  65 81 69  Resp:  18 19 18   Temp:  98.1 F (36.7 C) 97.6 F (36.4 C) 97.7 F (36.5 C)  TempSrc:  Oral Oral    SpO2:  100% 94% 98%  Weight: 71.2 kg     Height:        Intake/Output Summary (Last 24 hours) at 02/13/2020 1556 Last data filed at 02/13/2020 1342 Gross per 24 hour  Intake --  Output 0 ml  Net 0 ml   Filed Weights   02/12/20 0028 02/12/20 0359 02/13/20 0316  Weight: 72.6 kg 72.6 kg 71.2 kg   Exam:  Constitutional:  . The patient is sleeping soundly.  No acute distress. Respiratory:  . No increased work of breathing. . No wheezes, rales, or rhonchi . No tactile fremitus Cardiovascular:  . Regular rate and rhythm . No murmurs, ectopy, or gallups. . No lateral PMI. No thrills. Abdomen:  . Abdomen is soft, non-tender, non-distended . No hernias, masses, or organomegaly . Normoactive bowel sounds.  Musculoskeletal:  . No cyanosis, clubbing, or edema Skin:  . No rashes, lesions, ulcers . palpation of skin: no induration or nodules Neurologic:  . Unable to evaluate as the patient is unable to cooperate with exam. Psychiatric:  Pt is calm and cooperative today.  Consultants:  PCCM  Cardiology  Palliative care  Psychiatry  Procedures:  Status post intubation and extubation  Cardiac catheterization 01/14/2020  NGT placement 02/02/2020  Data Reviewed:  Basic Metabolic Panel: Recent Labs  Lab 02/07/20 0550 02/08/20 0533 02/09/20 0538 02/11/20 0637 02/12/20 0428  NA 138 138  --  140 139  K 4.5 4.2  --  4.3 4.0  CL 104 103  --  106 109  CO2 22 21*  --  23 21*  GLUCOSE 93 109*  --  126* 109*  BUN 17 18  --  28* 28*  CREATININE 0.94 0.94  --  1.22 0.99  CALCIUM 9.6 9.7  --  9.6 8.9  MG 1.9 2.1  --  2.0 1.8  PHOS  --   --  4.0  --  3.2   Liver Function Tests: Recent Labs  Lab 02/12/20 0428  AST 17  ALT 11  ALKPHOS 57  BILITOT 1.0  PROT 7.0  ALBUMIN 3.2*   No results for input(s): LIPASE, AMYLASE in the last 168 hours. No results for input(s): AMMONIA in the last 168 hours. CBC: Recent Labs  Lab 02/09/20 0538 02/10/20 0527  02/11/20 0637 02/12/20 0428 02/13/20 0550  WBC 12.3* 15.5* 13.2* 12.9* 11.6*  NEUTROABS 8.1* 9.8* 8.6* 8.2* 7.1  HGB 16.7 16.5 16.3 15.6 16.2  HCT 54.3* 53.3* 53.0* 50.9 52.7*  MCV 70.3* 71.2* 71.3* 70.9* 70.7*  PLT 611* 582* 525* 394 405*   Cardiac Enzymes: No results for input(s): CKTOTAL, CKMB, CKMBINDEX, TROPONINI in the last 168 hours. BNP (last 3 results) No results for input(s): PROBNP in the last 8760 hours. CBG: Recent Labs  Lab 02/12/20 2109 02/12/20 2326 02/13/20 0316 02/13/20 0918 02/13/20 1146  GLUCAP 180* 122* 124* 127* 223*    No results found for this or any previous visit (from the past 240 hour(s)).    Studies: No results found.   Scheduled Meds: . allopurinol  300 mg Oral Daily  . amiodarone  200 mg Oral BID  . apixaban  5 mg Oral BID  . atorvastatin  40 mg Oral QHS  . carvedilol  6.25 mg Oral BID WC  . chlorhexidine  15 mL Mouth Rinse BID  . Chlorhexidine Gluconate Cloth  6 each Topical Daily  . docusate  100 mg Oral BID  . famotidine  20 mg Oral Daily  . feeding supplement  237 mL Oral TID BM  . furosemide  20 mg Oral Daily  . insulin aspart  0-9 Units Subcutaneous Q4H  . mouth rinse  15 mL Mouth Rinse q12n4p  . melatonin  5 mg Oral QHS  . multivitamin with minerals  1 tablet Oral Daily  . nicotine  21 mg Transdermal Daily  . OLANZapine zydis  5 mg Oral BID  . polyethylene glycol  17 g Oral Daily  . potassium chloride  20 mEq Oral Daily  . sacubitril-valsartan  1 tablet Oral BID  . senna-docusate  1 tablet Oral BID  . sodium chloride flush  10-40 mL Intracatheter Q12H  . sodium chloride flush  3 mL Intravenous Q12H  . spironolactone  12.5 mg Oral Daily   Continuous Infusions: . 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 QTC remains prolonged at 593.  Cardiology has signed off. Awaiting placement. Pt has not needed a sitter for a few days.  HFrEF Nonischemic cardiomyopathy, EF of 20 to 25% Cardiac catheterization 01/24/2020  with nonobstructive disease Continue diuresis, beta-blockers and Entresto. Cardiology has signed off.   S/P episode of VT subsequent cardiogenic shock Status post cardioversion Presently on oral amiodarone Daryl Eastern is to keep potassium greater than 4 and magnesium greater than 2. Cardiology has signed off.   PAF On Coreg and amiodarone Continue Eliquis Cardiology has signed off.   S/P Pneumonia Patient is status post treatment for Pseudomonas and MRSA pneumonia per tracheal culture. Completed antibiotics 01/28/2020.  Moderate protein calorie malnutrition Due to decreased oral intake Per patient's daughter, no tube feedings are warranted, she would like to have her father eat by himself. On dysphagia 1 diet Nutrition Status: Nutrition Problem: Moderate Malnutrition Etiology: chronic illness (CHF) Signs/Symptoms: moderate fat depletion,moderate muscle depletion Interventions: Tube feeding,MVI  Stage II sacral decub Continue wound care  I have seen and examined this patient myself. I have spent 34 minutes in his evaluation and care.  DVT prophylaxis: On Eliquis Code Status: Full Family Communication: SNF Disposition Plan:   Patient is from: Home  Anticipated Discharge Location: SNF  Barriers to Discharge: Lack of a safe discharge  Is patient medically stable for Discharge: No   Koda Defrank, [facility]  If 7PM-7AM, please  contact night-coverage [email or web address] Password [email or web address] 02/13/2020, 3:56 PM    LOS: 38 days

## 2020-02-13 NOTE — Progress Notes (Signed)
Physical Therapy Treatment Patient Details Name: Travis Maxwell MRN: [id number] DOB: 09-16-1950 Today's Date: 02/13/2020    History of Present Illness presented to ER secondary to CP, SOB; admitted for mangement of severe acute hypoxic and hypercapnic respiratory failure, acute toxic metabolic encephalopathy and cardiogenic shock.  Hospital course significacnt for intubation 11/1-11/6, 11/14-11/20, currently on RA; L/R heart cath (11/19) with no obstuctive lesions noted, EF approx 20%.  Recommended for lifevest at DC with further evaluation for AICD placement..    PT Comments    Pt is making good progress towards goals with ability to ambulate 2 laps in room. Still remains sleepy, but able to stay awake with cues. There-ex performed on B LE in seated position. Needs heavy cues for safety with mobility. Attempted mobility without AD, very unsteady. RW used, however still needs assistance with guidance.   Follow Up Recommendations  SNF     Equipment Recommendations  Rolling walker with 5" wheels    Recommendations for Other Services       Precautions / Restrictions Precautions Precautions: Fall Restrictions Weight Bearing Restrictions: No    Mobility  Bed Mobility Overal bed mobility: Needs Assistance Bed Mobility: Supine to Sit;Sit to Supine     Supine to sit: Mod assist     General bed mobility comments: has difficulty following commands. Needs cues to initiate mobility towards EOB. Once seated at EOB, tactile cues for posture  Transfers Overall transfer level: Needs assistance Equipment used: None Transfers: Sit to/from Stand Sit to Stand: Min assist         General transfer comment: assist for initiating mobility. Unsteady with slight post lean  Ambulation/Gait Ambulation/Gait assistance: Min assist Gait Distance (Feet): 80 Feet Assistive device: Rolling walker (2 wheeled) Gait Pattern/deviations: Step-to pattern     General Gait Details: has difficulty  following commands and general decreased safety awareness or ability to safely use RW. Needs constant hands on assist and cues for AD. Fatigues with increased distance   Stairs             Wheelchair Mobility    Modified Rankin (Stroke Patients Only)       Balance Overall balance assessment: Needs assistance Sitting-balance support: Feet supported;Bilateral upper extremity supported Sitting balance-Leahy Scale: Fair     Standing balance support: Bilateral upper extremity supported Standing balance-Leahy Scale: Fair                              Cognition Arousal/Alertness:  (sleepy, but able to maintain alertness) Behavior During Therapy: WFL for tasks assessed/performed Overall Cognitive Status: Impaired/Different from baseline                                 General Comments: sleeping with covers drawn over head upon arrival.      Exercises Other Exercises Other Exercises: performed 5 time sit<>stand with min assist. Not timed. Very slow and effortful. LIkely due to fatigue Other Exercises: Seated marching performed on B LE x 5 reps with cga Other Exercises: Ambulation performed x 10' without AD with mod assist and unsteady gait. Multiple LOB. Further ambulation performed with AD    General Comments        Pertinent Vitals/Pain Pain Assessment: No/denies pain    Home Living                      Prior  Function            PT Goals (current goals can now be found in the care plan section) Acute Rehab PT Goals Patient Stated Goal: none stated PT Goal Formulation: With patient Time For Goal Achievement: 02/27/20 Potential to Achieve Goals: Fair Progress towards PT goals: Progressing toward goals    Frequency    Min 2X/week      PT Plan Current plan remains appropriate    Co-evaluation              AM-PAC PT "6 Clicks" Mobility   Outcome Measure  Help needed turning from your back to your side while in  a flat bed without using bedrails?: A Little Help needed moving from lying on your back to sitting on the side of a flat bed without using bedrails?: A Little Help needed moving to and from a bed to a chair (including a wheelchair)?: A Lot Help needed standing up from a chair using your arms (e.g., wheelchair or bedside chair)?: A Lot Help needed to walk in hospital room?: A Lot Help needed climbing 3-5 steps with a railing? : Total 6 Click Score: 13    End of Session Equipment Utilized During Treatment: Gait belt Activity Tolerance: Patient limited by fatigue Patient left: with chair alarm set;in chair;with family/visitor present (CNA notified) Nurse Communication: Mobility status PT Visit Diagnosis: Muscle weakness (generalized) (M62.81);Difficulty in walking, not elsewhere classified (R26.2);Other symptoms and signs involving the nervous system (R29.898)     Time: 6967-8938 PT Time Calculation (min) (ACUTE ONLY): 24 min  Charges:  $Gait Training: 8-22 mins $Therapeutic Exercise: 8-22 mins                     Elizabeth Palau, PT, DPT [phone]    Charna Neeb 02/13/2020, 12:58 PM

## 2020-02-13 NOTE — Progress Notes (Signed)
Occupational Therapy Treatment Patient Details Name: Travis Maxwell MRN: [id number] DOB: September 03, 1950 Today's Date: 02/13/2020    History of present illness presented to ER secondary to CP, SOB; admitted for mangement of severe acute hypoxic and hypercapnic respiratory failure, acute toxic metabolic encephalopathy and cardiogenic shock.  Hospital course significacnt for intubation 11/1-11/6, 11/14-11/20, currently on RA; L/R heart cath (11/19) with no obstuctive lesions noted, EF approx 20%.  Recommended for lifevest at DC with further evaluation for AICD placement..   OT comments  Pt seen for OT tx this date. Pt alert, agreeable to session, however required cues to redirect to task and situation, as pt kept requesting someone get him food from [employer]. Pt required supervision-CGA and MAX verbal cues for sequencing to sit on the EOB. Once seated, able to hold himself up, no LOB. Agreeable to brush his teeth, however when handed items he did not prepare the toothpaste, despite verbal cues to sequence. Ultimately pt required OT to set up toothbrush and toothpaste and cues to initiate, spit into basin. Pt returned to bed spontaneously. Pt continues to benefit from skilled OT services, continues to be limited 2/2 cognitive deficits.    Follow Up Recommendations  SNF    Equipment Recommendations  Other (comment) (defer to next level of care)    Recommendations for Other Services      Precautions / Restrictions Precautions Precautions: Fall Restrictions Weight Bearing Restrictions: No       Mobility Bed Mobility Overal bed mobility: Needs Assistance Bed Mobility: Supine to Sit;Sit to Supine     Supine to sit: Min guard Sit to supine: Min guard   General bed mobility comments: difficulty following commands, cues for sequencing  Transfers                      Balance Overall balance assessment: Needs assistance Sitting-balance support: No upper extremity supported;Feet  supported Sitting balance-Leahy Scale: Fair                                     ADL either performed or assessed with clinical judgement   ADL Overall ADL's : Needs assistance/impaired     Grooming: Oral care;Sitting;Set up;Supervision/safety Grooming Details (indicate cue type and reason): Cues to initiate after pt held toothbrush and toothpaste but didn't put the toothpaste on the brush, ultimately requiring OT to set up toothbrush and toothpaste.                                     Vision       Perception     Praxis      Cognition Arousal/Alertness: Awake/alert Behavior During Therapy: WFL for tasks assessed/performed Overall Cognitive Status: Impaired/Different from baseline                                 General Comments: confused, perseverating on request for a 3-piece chicken strip from [employer] requiring repeated cues to redirect and reorient        Exercises     Shoulder Instructions       General Comments      Pertinent Vitals/ Pain       Pain Assessment: No/denies pain  Home Living  Prior Functioning/Environment              Frequency  Min 1X/week        Progress Toward Goals  OT Goals(current goals can now be found in the care plan section)  Progress towards OT goals: Progressing toward goals  Acute Rehab OT Goals Patient Stated Goal: none stated OT Goal Formulation: Patient unable to participate in goal setting Time For Goal Achievement: 02/24/20 Potential to Achieve Goals: Good  Plan Discharge plan remains appropriate;Frequency remains appropriate    Co-evaluation                 AM-PAC OT "6 Clicks" Daily Activity     Outcome Measure   Help from another person eating meals?: A Little Help from another person taking care of personal grooming?: A Little Help from another person toileting, which includes using  toliet, bedpan, or urinal?: A Lot Help from another person bathing (including washing, rinsing, drying)?: A Lot Help from another person to put on and taking off regular upper body clothing?: A Lot Help from another person to put on and taking off regular lower body clothing?: A Lot 6 Click Score: 14    End of Session    OT Visit Diagnosis: Unsteadiness on feet (R26.81);Muscle weakness (generalized) (M62.81)   Activity Tolerance Patient tolerated treatment well   Patient Left in bed;with call bell/phone within reach;with bed alarm set   Nurse Communication Mobility status        Time: 0947-0962 OT Time Calculation (min): 14 min  Charges: OT General Charges $OT Visit: 1 Visit OT Treatments $Self Care/Home Management : 8-22 mins  Richrd Prime, MPH, MS, OTR/L ascom [phone] 02/13/20, 5:14 PM

## 2020-02-13 NOTE — Progress Notes (Signed)
Daughter Delano Sink called and updated of patient fall.

## 2020-02-14 ENCOUNTER — Other Ambulatory Visit: Payer: Self-pay

## 2020-02-14 LAB — GLUCOSE, CAPILLARY
Glucose-Capillary: 106 mg/dL — ABNORMAL HIGH (ref 70–99)
Glucose-Capillary: 124 mg/dL — ABNORMAL HIGH (ref 70–99)
Glucose-Capillary: 151 mg/dL — ABNORMAL HIGH (ref 70–99)
Glucose-Capillary: 156 mg/dL — ABNORMAL HIGH (ref 70–99)
Glucose-Capillary: 158 mg/dL — ABNORMAL HIGH (ref 70–99)
Glucose-Capillary: 198 mg/dL — ABNORMAL HIGH (ref 70–99)
Glucose-Capillary: 201 mg/dL — ABNORMAL HIGH (ref 70–99)

## 2020-02-14 LAB — CBC WITH DIFFERENTIAL/PLATELET
Abs Immature Granulocytes: 0.08 10*3/uL — ABNORMAL HIGH (ref 0.00–0.07)
Basophils Absolute: 0.4 10*3/uL — ABNORMAL HIGH (ref 0.0–0.1)
Basophils Relative: 3 %
Eosinophils Absolute: 0.9 10*3/uL — ABNORMAL HIGH (ref 0.0–0.5)
Eosinophils Relative: 8 %
HCT: 53.4 % — ABNORMAL HIGH (ref 39.0–52.0)
Hemoglobin: 16.6 g/dL (ref 13.0–17.0)
Immature Granulocytes: 1 %
Lymphocytes Relative: 21 %
Lymphs Abs: 2.5 10*3/uL (ref 0.7–4.0)
MCH: 22.2 pg — ABNORMAL LOW (ref 26.0–34.0)
MCHC: 31.1 g/dL (ref 30.0–36.0)
MCV: 71.3 fL — ABNORMAL LOW (ref 80.0–100.0)
Monocytes Absolute: 1.1 10*3/uL — ABNORMAL HIGH (ref 0.1–1.0)
Monocytes Relative: 10 %
Neutro Abs: 6.7 10*3/uL (ref 1.7–7.7)
Neutrophils Relative %: 57 %
Platelets: 410 10*3/uL — ABNORMAL HIGH (ref 150–400)
RBC: 7.49 MIL/uL — ABNORMAL HIGH (ref 4.22–5.81)
RDW: 27.1 % — ABNORMAL HIGH (ref 11.5–15.5)
Smear Review: NORMAL
WBC: 11.6 10*3/uL — ABNORMAL HIGH (ref 4.0–10.5)
nRBC: 0 % (ref 0.0–0.2)

## 2020-02-14 MED ORDER — OLANZAPINE 5 MG PO TBDP
5.0000 mg | ORAL_TABLET | Freq: Three times a day (TID) | ORAL | Status: DC
  Administered 2020-02-14 – 2020-02-15 (×3): 5 mg via ORAL
  Filled 2020-02-14 (×5): qty 1

## 2020-02-14 MED ORDER — LORAZEPAM 2 MG/ML IJ SOLN
1.0000 mg | Freq: Once | INTRAMUSCULAR | Status: AC
  Administered 2020-02-14: 1 mg via INTRAVENOUS
  Filled 2020-02-14: qty 1

## 2020-02-14 MED ORDER — ZIPRASIDONE MESYLATE 20 MG IM SOLR
10.0000 mg | INTRAMUSCULAR | Status: AC
  Administered 2020-02-14: 10 mg via INTRAMUSCULAR
  Filled 2020-02-14: qty 20

## 2020-02-14 NOTE — TOC Progression Note (Addendum)
Transition of Care [facility]) - Progression Note    Patient Details  Name: Tasman Zapata MRN: [id number] Date of Birth: 09-06-1950  Transition of Care [facility] CM/SW Contact  Hetty Ely, RN Phone Number: 02/14/2020, 1:58 PM  Clinical Narrative:  Received SNF Acceptance Bed [facility]. Called facility to confirm, spoke with Admissions, they will get Insurance Authorization and call me back. Called patient's daughter, [name] [phone] who do not want patient to go that distance, too far for family to visit. Daughter says she will call me back, will make arrangements for patient to come home. My contact information given.   [facility] did accept patient, daughter notified. Will message [facility] staff to assist with discharge tomorrow, 02/15/2020.      Expected Discharge Plan: Skilled Nursing Facility Barriers to Discharge: Continued Medical Work up,Other (comment) (Patient continues to be medically unstable.)  Expected Discharge Plan and Services Expected Discharge Plan: Skilled Nursing Facility In-house Referral: Clinical Social Work   Post Acute Care Choice: Skilled Nursing Facility Living arrangements for the past 2 months: Single Family Home                                       Social Determinants of Health (SDOH) Interventions    Readmission Risk Interventions No flowsheet data found.

## 2020-02-14 NOTE — Progress Notes (Signed)
PROGRESS NOTE    Travis Maxwell  XNA:[id number]  DOB: [birth year]  DOA: 01/06/2020 PCP: [facility]:   Hospital course:  69 year old African-American male with past medical history of hypertension, diabetes mellitus type 2, hyperlipidemia, GERD, coronary artery disease status post PCI, history of aortic valve replacement on Eliquis presented to the hospital with respiratory distress, chest pain and shortness of breath on 01/06/2020.  He also had orthopnea and leg swelling.  Initially, patient was put on BiPAP but his breathing status worsened so he was emergently intubated and admitted to the hospital.  Patient was noted to be in severe cardiogenic shock on 01/07/2020.  Vasopressors were weaned off on 11/ 6/21 and patient was extubated to BiPAP at that time but patient was subsequently intubated on 01/19/2020.  Patient also had severe metabolic acidosis.  EKG showed some concerns for ST elevation but did not meet ST elevation MI criteria as per cardiology.  Patient received IV Cardizem and have a borderline elevated troponins.  BNP on admission was 1046.  Patient had a chest x-ray which showed vascular congestion.  Of note, patient has history of Covid pneumonia on 12/04/2019.  Patient remained extubated and was off pressors on 01/29/20 and was subsequently considered for transfer to medical service.  Patient has remained encephalopathic with a poor oral intake.  Patient was started on NG tube and tube feeding was started on 02/03/2020.  Has had episodes of agitation and confusion requiring sedation in 1-1 sitter. Today he is resting comfortably without a sitter.   PT/OT have evaluated the patient and have recommended SNF placement. TOC has been consulted to assist with placement.  Subjective:  Patient is awake and calm. No new complaints.  Objective: Vitals:   02/14/20 0737 02/14/20 0857 02/14/20 1121 02/14/20 1510  BP: (!) 86/63 94/62 (!) 95/58  103/60  Pulse: 73 65 64 62  Resp: 15  15 16   Temp: 97.9 F (36.6 C)  98.3 F (36.8 C) 98.4 F (36.9 C)  TempSrc: Oral  Oral Oral  SpO2: 99%  99% 100%  Weight:      Height:        Intake/Output Summary (Last 24 hours) at 02/14/2020 1640 Last data filed at 02/14/2020 1158 Gross per 24 hour  Intake --  Output 400 ml  Net -400 ml   Filed Weights   02/12/20 0028 02/12/20 0359 02/13/20 0316  Weight: 72.6 kg 72.6 kg 71.2 kg   Exam:  Constitutional:  . The patient is sleeping soundly.  No acute distress. Respiratory:  . No increased work of breathing. . No wheezes, rales, or rhonchi . No tactile fremitus Cardiovascular:  . Regular rate and rhythm . No murmurs, ectopy, or gallups. . No lateral PMI. No thrills. Abdomen:  . Abdomen is soft, non-tender, non-distended . No hernias, masses, or organomegaly . Normoactive bowel sounds.  Musculoskeletal:  . No cyanosis, clubbing, or edema Skin:  . No rashes, lesions, ulcers . palpation of skin: no induration or nodules Neurologic:  . Unable to evaluate as the patient is unable to cooperate with exam. Psychiatric:  Pt is calm and cooperative today.  Consultants:  PCCM  Cardiology  Palliative care  Psychiatry  Procedures:  Status post intubation and extubation  Cardiac catheterization 01/14/2020  NGT placement 02/02/2020  Data Reviewed:  Basic Metabolic Panel: Recent Labs  Lab 02/08/20 0533 02/09/20 0538 02/11/20 0637 02/12/20 0428  NA 138  --  140 139  K 4.2  --  4.3 4.0  CL 103  --  106 109  CO2 21*  --  23 21*  GLUCOSE 109*  --  126* 109*  BUN 18  --  28* 28*  CREATININE 0.94  --  1.22 0.99  CALCIUM 9.7  --  9.6 8.9  MG 2.1  --  2.0 1.8  PHOS  --  4.0  --  3.2   Liver Function Tests: Recent Labs  Lab 02/12/20 0428  AST 17  ALT 11  ALKPHOS 57  BILITOT 1.0  PROT 7.0  ALBUMIN 3.2*   No results for input(s): LIPASE, AMYLASE in the last 168 hours. No results for input(s): AMMONIA in the  last 168 hours. CBC: Recent Labs  Lab 02/10/20 0527 02/11/20 0637 02/12/20 0428 02/13/20 0550 02/14/20 0711  WBC 15.5* 13.2* 12.9* 11.6* 11.6*  NEUTROABS 9.8* 8.6* 8.2* 7.1 6.7  HGB 16.5 16.3 15.6 16.2 16.6  HCT 53.3* 53.0* 50.9 52.7* 53.4*  MCV 71.2* 71.3* 70.9* 70.7* 71.3*  PLT 582* 525* 394 405* 410*   Cardiac Enzymes: No results for input(s): CKTOTAL, CKMB, CKMBINDEX, TROPONINI in the last 168 hours. BNP (last 3 results) No results for input(s): PROBNP in the last 8760 hours. CBG: Recent Labs  Lab 02/14/20 0039 02/14/20 0446 02/14/20 0735 02/14/20 1119 02/14/20 1623  GLUCAP 151* 124* 106* 198* 158*    No results found for this or any previous visit (from the past 240 hour(s)).    Studies: CT HEAD WO CONTRAST  Result Date: 02/13/2020 CLINICAL DATA:  Head trauma, minor. Additional history provided: Patient remains encephalopathic with poor oral intake. Episode of agitation and confusion requiring sedation. EXAM: CT HEAD WITHOUT CONTRAST TECHNIQUE: Contiguous axial images were obtained from the base of the skull through the vertex without intravenous contrast. COMPARISON:  Head CT 08/13/2019. FINDINGS: Brain: Mild cerebral atrophy. 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 A right thalamic lacunar infarct is new as compared to the head CT of 08/13/2019, but chronic in appearance. Mild ill-defined hypoattenuation within the cerebral white matter is nonspecific, but compatible with chronic small vessel ischemic disease. There is no acute intracranial hemorrhage. No acute demarcated cortical infarct. No extra-axial fluid collection. No evidence of intracranial mass. No midline shift. Vascular: No hyperdense vessel. Skull: Normal. Negative for fracture or focal lesion. Sinuses/Orbits: Visualized orbits show no acute finding. Small left maxillary sinus mucous retention cyst. IMPRESSION: No evidence of acute intracranial  abnormality. A right thalamic lacunar infarct is new from the prior head CT of 08/13/2019, but chronic in appearance. Redemonstrated small chronic right parietal lobe cortical infarct. Stable background mild cerebral atrophy and chronic small vessel ischemic disease. Electronically Signed   By: Jackey Loge DO   On: 02/13/2020 19:05     Scheduled Meds: . allopurinol  300 mg Oral Daily  . amiodarone  200 mg Oral BID  . apixaban  5 mg Oral BID  . atorvastatin  40 mg Oral QHS  . carvedilol  6.25 mg Oral BID WC  . chlorhexidine  15 mL Mouth Rinse BID  . Chlorhexidine Gluconate Cloth  6 each Topical Daily  . docusate  100 mg Oral BID  . famotidine  20 mg Oral Daily  . feeding supplement  237 mL Oral TID BM  . furosemide  20 mg Oral Daily  . insulin aspart  0-9 Units Subcutaneous Q4H  . mouth rinse  15 mL Mouth Rinse q12n4p  . melatonin  5  mg Oral QHS  . multivitamin with minerals  1 tablet Oral Daily  . nicotine  21 mg Transdermal Daily  . OLANZapine zydis  5 mg Oral BID  . polyethylene glycol  17 g Oral Daily  . potassium chloride  20 mEq Oral Daily  . sacubitril-valsartan  1 tablet Oral BID  . senna-docusate  1 tablet Oral BID  . sodium chloride flush  10-40 mL Intracatheter Q12H  . sodium chloride flush  3 mL Intravenous Q12H  . spironolactone  12.5 mg Oral Daily   Continuous Infusions: . 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 QTC  remains prolonged at 593.  Cardiology has signed off. Awaiting placement. Pt has not needed a sitter for a few days. SNf placement has been recommended.  HFrEF Nonischemic cardiomyopathy, EF of 20 to 25% Cardiac catheterization 01/24/2020 with nonobstructive disease Continue diuresis, beta-blockers and Entresto. Cardiology has signed off.   S/P episode of VT subsequent cardiogenic shock Status post cardioversion Presently on oral amiodarone Daryl Eastern is to keep potassium greater than 4 and magnesium greater than 2. Cardiology has signed off.   PAF On Coreg and amiodarone Continue Eliquis Cardiology has signed off.   S/P Pneumonia Patient is status post treatment for Pseudomonas and MRSA pneumonia per tracheal culture. Completed antibiotics 01/28/2020.  Moderate protein calorie malnutrition Due to decreased oral intake Per patient's daughter, no tube feedings are warranted, she would like to have her father eat by himself. On dysphagia 1 diet Nutrition Status: Nutrition Problem: Moderate Malnutrition Etiology: chronic illness (CHF) Signs/Symptoms: moderate fat depletion,moderate muscle depletion Interventions: Tube feeding,MVI  Stage II sacral decub Continue wound care  I have seen and examined this patient myself. I have spent 34 minutes in his evaluation and care.  DVT prophylaxis: On Eliquis Code Status: Full Family Communication: SNF Disposition Plan:   Patient is from: Home  Anticipated Discharge Location: SNF  Barriers to Discharge: Lack of a safe discharge  Is patient medically stable for Discharge: No  Jagar Lua, [facility]  If 7PM-7AM, please contact night-coverage [email or web address] Password [email or web address] 02/14/2020, 4:40 PM    LOS: 39 days

## 2020-02-14 NOTE — Consult Note (Signed)
[facility] Face-to-Face Psychiatry Consult   Reason for Consult: Follow-up note for this patient with dementia and resolving delirium Referring Physician: Swayze Patient Identification: Travis Maxwell MRN:  [id number] Principal Diagnosis: Delirium Diagnosis:  Principal Problem:   Delirium Active Problems:   Acute on chronic systolic congestive heart failure (HCC)   Respiratory failure (HCC)   Malnutrition of moderate degree   Adult failure to thrive   Palliative care by specialist   DNR (do not resuscitate) discussion   Pressure injury of skin   Ventricular tachycardia (HCC)   Atrial fibrillation with RVR (HCC)   Total Time spent with patient: 30 minutes  Subjective:   Travis Maxwell is a 69 y.o. male patient admitted with "I do not know".  HPI: Came to see patient just after suppertime.  Patient was more agitated.  Trying to crawl out of bed.  Still responded verbally but is less verbal than yesterday and makes less sense.  Does not know where he is.  Clearly more confused.  Past Psychiatric History: None reported see old notes  Risk to Self:   Risk to Others:   Prior Inpatient Therapy:   Prior Outpatient Therapy:    Past Medical History:  Past Medical History:  Diagnosis Date  . CAD (coronary artery disease)   . Chronic systolic (congestive) heart failure (HCC)   . Diabetes mellitus without complication (HCC)   . GERD (gastroesophageal reflux disease)   . Gout   . Hx of aortic valve replacement    Bioprosthetic valve  . Hypertension     Past Surgical History:  Procedure Laterality Date  . CORONARY STENT INTERVENTION N/A 07/25/2017   Procedure: CORONARY STENT INTERVENTION;  Surgeon: Alwyn Pea, MD;  Location: [facility] INVASIVE CV LAB;  Service: Cardiovascular;  Laterality: N/A;  . LEFT HEART CATH AND CORONARY ANGIOGRAPHY N/A 07/25/2017   Procedure: LEFT HEART CATH AND CORONARY ANGIOGRAPHY;  Surgeon: Lamar Blinks, MD;  Location: [facility] INVASIVE CV LAB;  Service:  Cardiovascular;  Laterality: N/A;  . RIGHT/LEFT HEART CATH AND CORONARY ANGIOGRAPHY N/A 01/24/2020   Procedure: RIGHT/LEFT HEART CATH AND CORONARY ANGIOGRAPHY;  Surgeon: Iran Ouch, MD;  Location: [facility] INVASIVE CV LAB;  Service: Cardiovascular;  Laterality: N/A;   Family History:  Family History  Problem Relation Age of Onset  . CAD Mother   . CAD Father    Family Psychiatric  History: See old notes Social History:  Social History   Substance and Sexual Activity  Alcohol Use Not Currently     Social History   Substance and Sexual Activity  Drug Use Yes  . Types: Marijuana   Comment: today    Social History   Socioeconomic History  . Marital status: Divorced    Spouse name: Not on file  . Number of children: Not on file  . Years of education: Not on file  . Highest education level: Not on file  Occupational History  . Not on file  Tobacco Use  . Smoking status: Current Every Day Smoker    Packs/day: 0.50    Types: Cigarettes    Last attempt to quit: 08/10/2017    Years since quitting: 2.5  . Smokeless tobacco: Never Used  Vaping Use  . Vaping Use: Never used  Substance and Sexual Activity  . Alcohol use: Not Currently  . Drug use: Yes    Types: Marijuana    Comment: today  . Sexual activity: Not on file  Other Topics Concern  . Not on file  Social History Narrative  . Not on file   Social Determinants of Health   Financial Resource Strain: Not on file  Food Insecurity: Not on file  Transportation Needs: Not on file  Physical Activity: Not on file  Stress: Not on file  Social Connections: Not on file   Additional Social History:    Allergies:   Allergies  Allergen Reactions  . Penicillins Other (See Comments)    Has patient had a PCN reaction causing immediate rash, facial/tongue/throat swelling, SOB or lightheadedness with hypotension: Unknown Has patient had a PCN reaction causing severe rash involving mucus membranes or skin necrosis:  Unknown Has patient had a PCN reaction that required hospitalization: Unknown Has patient had a PCN reaction occurring within the last 10 years: No If all of the above answers are "NO", then may proceed with Cephalosporin use.     Labs:  Results for orders placed or performed during the hospital encounter of 01/06/20 (from the past 48 hour(s))  Glucose, capillary     Status: Abnormal   Collection Time: 02/12/20  9:09 PM  Result Value Ref Range   Glucose-Capillary 180 (H) 70 - 99 mg/dL    Comment: Glucose reference range applies only to samples taken after fasting for at least 8 hours.  Glucose, capillary     Status: Abnormal   Collection Time: 02/12/20 11:26 PM  Result Value Ref Range   Glucose-Capillary 122 (H) 70 - 99 mg/dL    Comment: Glucose reference range applies only to samples taken after fasting for at least 8 hours.  Glucose, capillary     Status: Abnormal   Collection Time: 02/13/20  3:16 AM  Result Value Ref Range   Glucose-Capillary 124 (H) 70 - 99 mg/dL    Comment: Glucose reference range applies only to samples taken after fasting for at least 8 hours.  CBC with Differential/Platelet     Status: Abnormal   Collection Time: 02/13/20  5:50 AM  Result Value Ref Range   WBC 11.6 (H) 4.0 - 10.5 K/uL   RBC 7.45 (H) 4.22 - 5.81 MIL/uL   Hemoglobin 16.2 13.0 - 17.0 g/dL   HCT 26.9 (H) 48.5 - 46.2 %   MCV 70.7 (L) 80.0 - 100.0 fL   MCH 21.7 (L) 26.0 - 34.0 pg   MCHC 30.7 30.0 - 36.0 g/dL   RDW 70.3 (H) 50.0 - 93.8 %   Platelets 405 (H) 150 - 400 K/uL   nRBC 0.0 0.0 - 0.2 %   Neutrophils Relative % 60 %   Neutro Abs 7.1 1.7 - 7.7 K/uL   Lymphocytes Relative 20 %   Lymphs Abs 2.3 0.7 - 4.0 K/uL   Monocytes Relative 9 %   Monocytes Absolute 1.0 0.1 - 1.0 K/uL   Eosinophils Relative 7 %   Eosinophils Absolute 0.8 (H) 0.0 - 0.5 K/uL   Basophils Relative 3 %   Basophils Absolute 0.3 (H) 0.0 - 0.1 K/uL   WBC Morphology MORPHOLOGY UNREMARKABLE    Smear Review Normal  platelet morphology    Immature Granulocytes 1 %   Abs Immature Granulocytes 0.09 (H) 0.00 - 0.07 K/uL   Target Cells PRESENT     Comment: Performed at [facility], [address] [place], [place] [address]  Glucose, capillary     Status: Abnormal   Collection Time: 02/13/20  9:18 AM  Result Value Ref Range   Glucose-Capillary 127 (H) 70 - 99 mg/dL    Comment: Glucose reference range applies  only to samples taken after fasting for at least 8 hours.  Glucose, capillary     Status: Abnormal   Collection Time: 02/13/20 11:46 AM  Result Value Ref Range   Glucose-Capillary 223 (H) 70 - 99 mg/dL    Comment: Glucose reference range applies only to samples taken after fasting for at least 8 hours.  Glucose, capillary     Status: Abnormal   Collection Time: 02/13/20  4:52 PM  Result Value Ref Range   Glucose-Capillary 101 (H) 70 - 99 mg/dL    Comment: Glucose reference range applies only to samples taken after fasting for at least 8 hours.  Glucose, capillary     Status: Abnormal   Collection Time: 02/13/20  9:42 PM  Result Value Ref Range   Glucose-Capillary 234 (H) 70 - 99 mg/dL    Comment: Glucose reference range applies only to samples taken after fasting for at least 8 hours.  Glucose, capillary     Status: Abnormal   Collection Time: 02/14/20 12:39 AM  Result Value Ref Range   Glucose-Capillary 151 (H) 70 - 99 mg/dL    Comment: Glucose reference range applies only to samples taken after fasting for at least 8 hours.  Glucose, capillary     Status: Abnormal   Collection Time: 02/14/20  4:46 AM  Result Value Ref Range   Glucose-Capillary 124 (H) 70 - 99 mg/dL    Comment: Glucose reference range applies only to samples taken after fasting for at least 8 hours.  CBC with Differential/Platelet     Status: Abnormal   Collection Time: 02/14/20  7:11 AM  Result Value Ref Range   WBC 11.6 (H) 4.0 - 10.5 K/uL   RBC 7.49 (H) 4.22 - 5.81 MIL/uL   Hemoglobin 16.6 13.0 - 17.0 g/dL    HCT 65.7 (H) 84.6 - 52.0 %   MCV 71.3 (L) 80.0 - 100.0 fL   MCH 22.2 (L) 26.0 - 34.0 pg   MCHC 31.1 30.0 - 36.0 g/dL   RDW 96.2 (H) 95.2 - 84.1 %   Platelets 410 (H) 150 - 400 K/uL   nRBC 0.0 0.0 - 0.2 %   Neutrophils Relative % 57 %   Neutro Abs 6.7 1.7 - 7.7 K/uL   Lymphocytes Relative 21 %   Lymphs Abs 2.5 0.7 - 4.0 K/uL   Monocytes Relative 10 %   Monocytes Absolute 1.1 (H) 0.1 - 1.0 K/uL   Eosinophils Relative 8 %   Eosinophils Absolute 0.9 (H) 0.0 - 0.5 K/uL   Basophils Relative 3 %   Basophils Absolute 0.4 (H) 0.0 - 0.1 K/uL   WBC Morphology VACUOLATED NEUTROPHILS    Smear Review Normal platelet morphology    Immature Granulocytes 1 %   Abs Immature Granulocytes 0.08 (H) 0.00 - 0.07 K/uL   Target Cells PRESENT     Comment: Performed at [facility], [address] [place], [place] [address]  Glucose, capillary     Status: Abnormal   Collection Time: 02/14/20  7:35 AM  Result Value Ref Range   Glucose-Capillary 106 (H) 70 - 99 mg/dL    Comment: Glucose reference range applies only to samples taken after fasting for at least 8 hours.  Glucose, capillary     Status: Abnormal   Collection Time: 02/14/20 11:19 AM  Result Value Ref Range   Glucose-Capillary 198 (H) 70 - 99 mg/dL    Comment: Glucose reference range applies only to samples taken after fasting for at least  8 hours.  Glucose, capillary     Status: Abnormal   Collection Time: 02/14/20  4:23 PM  Result Value Ref Range   Glucose-Capillary 158 (H) 70 - 99 mg/dL    Comment: Glucose reference range applies only to samples taken after fasting for at least 8 hours.    Current Facility-Administered Medications  Medication Dose Route Frequency Provider Last Rate Last Admin  . 0.9 %  sodium chloride infusion   Intravenous PRN Vida Rigger, MD   Stopped at 02/05/20 1043  . acetaminophen (TYLENOL) tablet 650 mg  650 mg Oral Q4H PRN Erin Fulling, MD      . albuterol (VENTOLIN HFA) 108 (90 Base) MCG/ACT  inhaler 2 puff  2 puff Inhalation Q4H PRN Lorine Bears A, MD      . allopurinol (ZYLOPRIM) tablet 300 mg  300 mg Oral Daily Erin Fulling, MD   300 mg at 02/14/20 0902  . amiodarone (PACERONE) tablet 200 mg  200 mg Oral BID Lorine Bears A, MD   200 mg at 02/14/20 0903  . apixaban (ELIQUIS) tablet 5 mg  5 mg Oral BID Vida Rigger, MD   5 mg at 02/14/20 0903  . atorvastatin (LIPITOR) tablet 40 mg  40 mg Oral QHS Erin Fulling, MD   40 mg at 02/13/20 2150  . bisacodyl (DULCOLAX) suppository 10 mg  10 mg Rectal Daily PRN Pokhrel, Laxman, MD   10 mg at 02/08/20 0619  . carvedilol (COREG) tablet 6.25 mg  6.25 mg Oral BID WC Debbe Odea, MD   6.25 mg at 02/14/20 1718  . chlorhexidine (PERIDEX) 0.12 % solution 15 mL  15 mL Mouth Rinse BID Rust-Chester, Britton L, NP   15 mL at 02/13/20 2158  . Chlorhexidine Gluconate Cloth 2 % PADS 6 each  6 each Topical Daily Iran Ouch, MD   6 each at 02/13/20 1003  . docusate (COLACE) 50 MG/5ML liquid 100 mg  100 mg Oral BID Erin Fulling, MD   100 mg at 02/12/20 1017  . famotidine (PEPCID) tablet 20 mg  20 mg Oral Daily Sharen Hones, RPH   20 mg at 02/14/20 7262  . feeding supplement (ENSURE ENLIVE / ENSURE PLUS) liquid 237 mL  237 mL Oral TID BM Pokhrel, Laxman, MD   237 mL at 02/14/20 1400  . furosemide (LASIX) tablet 20 mg  20 mg Oral Daily Eula Listen M, PA-C   20 mg at 02/14/20 0355  . insulin aspart (novoLOG) injection 0-9 Units  0-9 Units Subcutaneous Q4H Iran Ouch, MD   2 Units at 02/14/20 1718  . MEDLINE mouth rinse  15 mL Mouth Rinse q12n4p Rust-Chester, Britton L, NP   15 mL at 02/14/20 1218  . melatonin tablet 5 mg  5 mg Oral QHS Vida Rigger, MD   5 mg at 02/13/20 2150  . metoprolol tartrate (LOPRESSOR) injection 5 mg  5 mg Intravenous Q2H PRN Iran Ouch, MD   5 mg at 01/25/20 1259  . multivitamin with minerals tablet 1 tablet  1 tablet Oral Daily Pokhrel, Laxman, MD   1 tablet at 02/14/20 0905  . nicotine (NICODERM  CQ - dosed in mg/24 hours) patch 21 mg  21 mg Transdermal Daily Swayze, Ava, DO   21 mg at 02/14/20 0905  . OLANZapine zydis (ZYPREXA) disintegrating tablet 5 mg  5 mg Oral TID Yaiden Yang T, MD      . ondansetron [facility]) injection 4 mg  4 mg Intravenous Q6H PRN  Iran Ouch, MD      . polyethylene glycol (MIRALAX / GLYCOLAX) packet 17 g  17 g Oral Daily Erin Fulling, MD   17 g at 02/14/20 0902  . polyethylene glycol (MIRALAX / GLYCOLAX) packet 17 g  17 g Oral Daily PRN Erin Fulling, MD      . potassium chloride (KLOR-CON) packet 20 mEq  20 mEq Oral Daily Sharen Hones, RPH   20 mEq at 02/14/20 0902  . sacubitril-valsartan (ENTRESTO) 24-26 mg per tablet  1 tablet Oral BID End, Cristal Deer, MD   1 tablet at 02/14/20 0903  . senna-docusate (Senokot-S) tablet 1 tablet  1 tablet Oral BID Erin Fulling, MD   1 tablet at 02/12/20 1018  . sodium chloride flush (NS) 0.9 % injection 10-40 mL  10-40 mL Intracatheter Q12H Lorine Bears A, MD   10 mL at 02/13/20 1004  . sodium chloride flush (NS) 0.9 % injection 3 mL  3 mL Intravenous Q12H Lorine Bears A, MD   3 mL at 02/13/20 2137  . spironolactone (ALDACTONE) tablet 12.5 mg  12.5 mg Oral Daily Debbe Odea, MD   12.5 mg at 02/14/20 0905    Musculoskeletal: Strength & Muscle Tone: decreased Gait & Station: unable to stand Patient leans: N/A  Psychiatric Specialty Exam: Physical Exam Vitals and nursing note reviewed.  Constitutional:      Appearance: He is well-nourished.  HENT:     Head: Normocephalic and atraumatic.  Eyes:     Conjunctiva/sclera: Conjunctivae normal.     Pupils: Pupils are equal, round, and reactive to light.  Cardiovascular:     Heart sounds: Normal heart sounds.  Pulmonary:     Effort: Pulmonary effort is normal.  Abdominal:     Palpations: Abdomen is soft.  Musculoskeletal:        General: Normal range of motion.     Cervical back: Normal range of motion.  Skin:    General: Skin is warm and dry.   Neurological:     Mental Status: He is alert.  Psychiatric:        Attention and Perception: He is inattentive.        Mood and Affect: Affect is blunt.        Speech: Speech is slurred.        Behavior: Behavior is slowed.        Cognition and Memory: Cognition is impaired. Memory is impaired.        Judgment: Judgment is impulsive.     Review of Systems  Unable to perform ROS: Psychiatric disorder    Blood pressure 103/60, pulse 62, temperature 98.4 F (36.9 C), temperature source Oral, resp. rate 16, height 5' 7.01" (1.702 m), weight 71.2 kg, SpO2 100 %.Body mass index is 24.58 kg/m.  General Appearance: Disheveled  Eye Contact:  Minimal  Speech:  Slow  Volume:  Decreased  Mood:  Euthymic  Affect:  Constricted  Thought Process:  Disorganized  Orientation:  Negative  Thought Content:  Illogical  Suicidal Thoughts:  No  Homicidal Thoughts:  No  Memory:  Negative  Judgement:  Negative  Insight:  Negative  Psychomotor Activity:  Restlessness  Concentration:  Concentration: Negative  Recall:  Negative  Fund of Knowledge:  Negative  Language:  Negative  Akathisia:  No  Handed:  Right  AIMS (if indicated):     Assets:  Social Support  ADL's:  Impaired  Cognition:  Impaired,  Moderate  Sleep:  Treatment Plan Summary: Daily contact with patient to assess and evaluate symptoms and progress in treatment, Medication management and Plan Patient seems a little bit worse today.  Probably having more trouble with sundowning this time in the evening.  I will go back up to 3 times a day dosing of the olanzapine.  Disposition: Patient does not meet criteria for psychiatric inpatient admission. Supportive therapy provided about ongoing stressors.  Mordecai Rasmussen, MD 02/14/2020 6:04 PM

## 2020-02-15 ENCOUNTER — Other Ambulatory Visit: Payer: Self-pay

## 2020-02-15 LAB — CBC WITH DIFFERENTIAL/PLATELET
Abs Immature Granulocytes: 0.09 10*3/uL — ABNORMAL HIGH (ref 0.00–0.07)
Basophils Absolute: 0.3 10*3/uL — ABNORMAL HIGH (ref 0.0–0.1)
Basophils Relative: 3 %
Eosinophils Absolute: 0.8 10*3/uL — ABNORMAL HIGH (ref 0.0–0.5)
Eosinophils Relative: 8 %
HCT: 52.6 % — ABNORMAL HIGH (ref 39.0–52.0)
Hemoglobin: 17 g/dL (ref 13.0–17.0)
Immature Granulocytes: 1 %
Lymphocytes Relative: 20 %
Lymphs Abs: 2.1 10*3/uL (ref 0.7–4.0)
MCH: 22.9 pg — ABNORMAL LOW (ref 26.0–34.0)
MCHC: 32.3 g/dL (ref 30.0–36.0)
MCV: 71 fL — ABNORMAL LOW (ref 80.0–100.0)
Monocytes Absolute: 1.1 10*3/uL — ABNORMAL HIGH (ref 0.1–1.0)
Monocytes Relative: 10 %
Neutro Abs: 6 10*3/uL (ref 1.7–7.7)
Neutrophils Relative %: 58 %
Platelets: 377 10*3/uL (ref 150–400)
RBC: 7.41 MIL/uL — ABNORMAL HIGH (ref 4.22–5.81)
RDW: 27.6 % — ABNORMAL HIGH (ref 11.5–15.5)
Smear Review: NORMAL
WBC: 10.4 10*3/uL (ref 4.0–10.5)
nRBC: 0 % (ref 0.0–0.2)

## 2020-02-15 LAB — BASIC METABOLIC PANEL
Anion gap: 11 (ref 5–15)
BUN: 23 mg/dL (ref 8–23)
CO2: 21 mmol/L — ABNORMAL LOW (ref 22–32)
Calcium: 9.5 mg/dL (ref 8.9–10.3)
Chloride: 105 mmol/L (ref 98–111)
Creatinine, Ser: 0.93 mg/dL (ref 0.61–1.24)
GFR, Estimated: >60 mL/min (ref >60)
Glucose, Bld: 121 mg/dL — ABNORMAL HIGH (ref 70–99)
Potassium: 4.2 mmol/L (ref 3.5–5.1)
Sodium: 137 mmol/L (ref 135–145)

## 2020-02-15 LAB — PHOSPHORUS: Phosphorus: 3.2 mg/dL (ref 2.5–4.6)

## 2020-02-15 LAB — GLUCOSE, CAPILLARY
Glucose-Capillary: 156 mg/dL — ABNORMAL HIGH (ref 70–99)
Glucose-Capillary: 130 mg/dL — ABNORMAL HIGH (ref 70–99)
Glucose-Capillary: 142 mg/dL — ABNORMAL HIGH (ref 70–99)
Glucose-Capillary: 137 mg/dL — ABNORMAL HIGH (ref 70–99)

## 2020-02-15 LAB — MAGNESIUM: Magnesium: 1.9 mg/dL (ref 1.7–2.4)

## 2020-02-15 MED ORDER — LORAZEPAM 2 MG/ML IJ SOLN
2.0000 mg | INTRAMUSCULAR | Status: DC | PRN
  Administered 2020-02-15 – 2020-02-19 (×8): 2 mg via INTRAVENOUS
  Filled 2020-02-15 (×9): qty 1

## 2020-02-15 MED ORDER — ZIPRASIDONE MESYLATE 20 MG IM SOLR
10.0000 mg | INTRAMUSCULAR | Status: AC
  Administered 2020-02-15: 05:00:00 10 mg via INTRAMUSCULAR
  Filled 2020-02-15: qty 20

## 2020-02-15 MED ORDER — OLANZAPINE 10 MG IM SOLR
5.0000 mg | Freq: Three times a day (TID) | INTRAMUSCULAR | Status: DC | PRN
  Filled 2020-02-15: qty 10

## 2020-02-15 MED ORDER — LORAZEPAM 2 MG/ML IJ SOLN
2.0000 mg | Freq: Once | INTRAMUSCULAR | Status: AC
  Administered 2020-02-15: 10:00:00 2 mg via INTRAVENOUS
  Filled 2020-02-15: qty 1

## 2020-02-15 NOTE — Progress Notes (Signed)
Patient combative and punching/kicking staff. 10mg  Geodon IM given. EKG will be completed once patient is calm. Sitter at bedside.
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
## 2020-02-16 LAB — CBC WITH DIFFERENTIAL/PLATELET
Abs Immature Granulocytes: 0.08 10*3/uL — ABNORMAL HIGH (ref 0.00–0.07)
Basophils Absolute: 0.4 10*3/uL — ABNORMAL HIGH (ref 0.0–0.1)
Basophils Relative: 4 %
Eosinophils Absolute: 1 10*3/uL — ABNORMAL HIGH (ref 0.0–0.5)
Eosinophils Relative: 9 %
HCT: 58 % — ABNORMAL HIGH (ref 39.0–52.0)
Hemoglobin: 18.5 g/dL — ABNORMAL HIGH (ref 13.0–17.0)
Immature Granulocytes: 1 %
Lymphocytes Relative: 22 %
Lymphs Abs: 2.5 10*3/uL (ref 0.7–4.0)
MCH: 22.4 pg — ABNORMAL LOW (ref 26.0–34.0)
MCHC: 31.9 g/dL (ref 30.0–36.0)
MCV: 70.1 fL — ABNORMAL LOW (ref 80.0–100.0)
Monocytes Absolute: 1.1 10*3/uL — ABNORMAL HIGH (ref 0.1–1.0)
Monocytes Relative: 9 %
Neutro Abs: 6.5 10*3/uL (ref 1.7–7.7)
Neutrophils Relative %: 55 %
Platelets: 477 10*3/uL — ABNORMAL HIGH (ref 150–400)
RBC: 8.27 MIL/uL — ABNORMAL HIGH (ref 4.22–5.81)
RDW: 27.4 % — ABNORMAL HIGH (ref 11.5–15.5)
Smear Review: NORMAL
WBC: 11.6 10*3/uL — ABNORMAL HIGH (ref 4.0–10.5)
nRBC: 0 % (ref 0.0–0.2)

## 2020-02-16 LAB — GLUCOSE, CAPILLARY
Glucose-Capillary: 101 mg/dL — ABNORMAL HIGH (ref 70–99)
Glucose-Capillary: 103 mg/dL — ABNORMAL HIGH (ref 70–99)
Glucose-Capillary: 153 mg/dL — ABNORMAL HIGH (ref 70–99)
Glucose-Capillary: 169 mg/dL — ABNORMAL HIGH (ref 70–99)
Glucose-Capillary: 99 mg/dL (ref 70–99)

## 2020-02-16 NOTE — Plan of Care (Signed)
Patient continues to be combative. 1:1 sitter, ativan x2 administered during shift.    Problem: Education: Goal: Ability to demonstrate management of disease process will improve Outcome: Progressing Goal: Ability to verbalize understanding of medication therapies will improve Outcome: Progressing   Problem: Activity: Goal: Capacity to carry out activities will improve Outcome: Progressing   Problem: Cardiac: Goal: Ability to achieve and maintain adequate cardiopulmonary perfusion will improve Outcome: Progressing   Problem: Education: Goal: Knowledge of General Education information will improve Description: Including pain rating scale, medication(s)/side effects and non-pharmacologic comfort measures Outcome: Progressing   Problem: Health Behavior/Discharge Planning: Goal: Ability to manage health-related needs will improve Outcome: Progressing   Problem: Clinical Measurements: Goal: Ability to maintain clinical measurements within normal limits will improve Outcome: Progressing Goal: Will remain free from infection Outcome: Progressing Goal: Diagnostic test results will improve Outcome: Progressing Goal: Respiratory complications will improve Outcome: Progressing Goal: Cardiovascular complication will be avoided Outcome: Progressing   Problem: Activity: Goal: Risk for activity intolerance will decrease Outcome: Progressing   Problem: Nutrition: Goal: Adequate nutrition will be maintained Outcome: Progressing   Problem: Coping: Goal: Level of anxiety will decrease Outcome: Progressing   Problem: Elimination: Goal: Will not experience complications related to bowel motility Outcome: Progressing Goal: Will not experience complications related to urinary retention Outcome: Progressing   Problem: Pain Managment: Goal: General experience of comfort will improve Outcome: Progressing   Problem: Safety: Goal: Ability to remain free from injury will  improve Outcome: Progressing   Problem: Skin Integrity: Goal: Risk for impaired skin integrity will decrease Outcome: Progressing
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
## 2020-02-17 LAB — CBC WITH DIFFERENTIAL/PLATELET
Abs Immature Granulocytes: 0.07 10*3/uL (ref 0.00–0.07)
Basophils Absolute: 0.4 10*3/uL — ABNORMAL HIGH (ref 0.0–0.1)
Basophils Relative: 3 %
Eosinophils Absolute: 1 10*3/uL — ABNORMAL HIGH (ref 0.0–0.5)
Eosinophils Relative: 8 %
HCT: 54.6 % — ABNORMAL HIGH (ref 39.0–52.0)
Hemoglobin: 17 g/dL (ref 13.0–17.0)
Immature Granulocytes: 1 %
Lymphocytes Relative: 25 %
Lymphs Abs: 3.1 10*3/uL (ref 0.7–4.0)
MCH: 22.3 pg — ABNORMAL LOW (ref 26.0–34.0)
MCHC: 31.1 g/dL (ref 30.0–36.0)
MCV: 71.6 fL — ABNORMAL LOW (ref 80.0–100.0)
Monocytes Absolute: 1.1 10*3/uL — ABNORMAL HIGH (ref 0.1–1.0)
Monocytes Relative: 9 %
Neutro Abs: 6.5 10*3/uL (ref 1.7–7.7)
Neutrophils Relative %: 54 %
Platelets: 455 10*3/uL — ABNORMAL HIGH (ref 150–400)
RBC: 7.63 MIL/uL — ABNORMAL HIGH (ref 4.22–5.81)
RDW: 27.2 % — ABNORMAL HIGH (ref 11.5–15.5)
Smear Review: NORMAL
WBC: 12.1 10*3/uL — ABNORMAL HIGH (ref 4.0–10.5)
nRBC: 0 % (ref 0.0–0.2)

## 2020-02-17 LAB — GLUCOSE, CAPILLARY
Glucose-Capillary: 119 mg/dL — ABNORMAL HIGH (ref 70–99)
Glucose-Capillary: 121 mg/dL — ABNORMAL HIGH (ref 70–99)
Glucose-Capillary: 128 mg/dL — ABNORMAL HIGH (ref 70–99)
Glucose-Capillary: 133 mg/dL — ABNORMAL HIGH (ref 70–99)
Glucose-Capillary: 150 mg/dL — ABNORMAL HIGH (ref 70–99)
Glucose-Capillary: 157 mg/dL — ABNORMAL HIGH (ref 70–99)

## 2020-02-17 MED ORDER — OLANZAPINE 10 MG IM SOLR
5.0000 mg | Freq: Three times a day (TID) | INTRAMUSCULAR | Status: DC
  Filled 2020-02-17 (×3): qty 10

## 2020-02-17 MED ORDER — MIRTAZAPINE 15 MG PO TBDP
15.0000 mg | ORAL_TABLET | Freq: Every day | ORAL | Status: DC
  Administered 2020-02-18 – 2020-02-19 (×2): 15 mg via ORAL
  Filled 2020-02-17 (×5): qty 1

## 2020-02-17 NOTE — Progress Notes (Signed)
Schedule olanzapine IM held- RN requesting oral dosing instead due to patient's history of being combative. Patient is calm at this time and may take dosing PO. Message sent to Webb Silversmith NP, on-call provider.
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
## 2020-02-17 NOTE — Progress Notes (Signed)
OT Cancellation Note  Patient Details Name: Travis Maxwell MRN: [id number] DOB: 10/20/1950   Cancelled Treatment:    Reason Eval/Treat Not Completed: Other (comment). Upon attempt, nursing with pt for care. Will re-attempt OT tx at later date/time as pt is available and appropriate.   Richrd Prime, MPH, MS, OTR/L ascom [phone] 02/17/20, 4:04 PM

## 2020-02-17 NOTE — Progress Notes (Signed)
Daughter Vallejo Sink updated on room transfer.

## 2020-02-17 NOTE — Progress Notes (Signed)
RN received patient from 2A, patient is alert, denies pain, no signs of distress. Was trying to get out of bed initially but was incontinent of urine- RN cleaned patient up, able to redirect back to bed.
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
## 2020-02-18 LAB — GLUCOSE, CAPILLARY
Glucose-Capillary: 114 mg/dL — ABNORMAL HIGH (ref 70–99)
Glucose-Capillary: 140 mg/dL — ABNORMAL HIGH (ref 70–99)
Glucose-Capillary: 152 mg/dL — ABNORMAL HIGH (ref 70–99)
Glucose-Capillary: 152 mg/dL — ABNORMAL HIGH (ref 70–99)
Glucose-Capillary: 188 mg/dL — ABNORMAL HIGH (ref 70–99)
Glucose-Capillary: 218 mg/dL — ABNORMAL HIGH (ref 70–99)

## 2020-02-18 LAB — CBC WITH DIFFERENTIAL/PLATELET
Abs Immature Granulocytes: 0.06 10*3/uL (ref 0.00–0.07)
Basophils Absolute: 0.2 10*3/uL — ABNORMAL HIGH (ref 0.0–0.1)
Basophils Relative: 2 %
Eosinophils Absolute: 0.8 10*3/uL — ABNORMAL HIGH (ref 0.0–0.5)
Eosinophils Relative: 8 %
HCT: 52.2 % — ABNORMAL HIGH (ref 39.0–52.0)
Hemoglobin: 16.6 g/dL (ref 13.0–17.0)
Immature Granulocytes: 1 %
Lymphocytes Relative: 23 %
Lymphs Abs: 2.3 10*3/uL (ref 0.7–4.0)
MCH: 22.7 pg — ABNORMAL LOW (ref 26.0–34.0)
MCHC: 31.8 g/dL (ref 30.0–36.0)
MCV: 71.3 fL — ABNORMAL LOW (ref 80.0–100.0)
Monocytes Absolute: 0.9 10*3/uL (ref 0.1–1.0)
Monocytes Relative: 9 %
Neutro Abs: 5.8 10*3/uL (ref 1.7–7.7)
Neutrophils Relative %: 57 %
Platelets: 422 10*3/uL — ABNORMAL HIGH (ref 150–400)
RBC: 7.32 MIL/uL — ABNORMAL HIGH (ref 4.22–5.81)
RDW: 26.7 % — ABNORMAL HIGH (ref 11.5–15.5)
WBC: 10.2 10*3/uL (ref 4.0–10.5)
nRBC: 0 % (ref 0.0–0.2)

## 2020-02-18 LAB — SARS CORONAVIRUS 2 BY RT PCR (HOSPITAL ORDER, PERFORMED IN ~~LOC~~ HOSPITAL LAB): SARS Coronavirus 2: NEGATIVE

## 2020-02-18 LAB — PHOSPHORUS: Phosphorus: 3.3 mg/dL (ref 2.5–4.6)

## 2020-02-18 MED ORDER — OLANZAPINE 5 MG PO TABS
5.0000 mg | ORAL_TABLET | Freq: Three times a day (TID) | ORAL | Status: DC
  Administered 2020-02-18 – 2020-02-19 (×5): 5 mg via ORAL
  Filled 2020-02-18 (×10): qty 1

## 2020-02-18 NOTE — Consult Note (Signed)
[facility] Face-to-Face Psychiatry Consult   Reason for Consult: Follow-up 69 year old man being followed for delirium Referring Physician: Swayze Patient Identification: Travis Maxwell MRN:  [id number] Principal Diagnosis: Delirium Diagnosis:  Principal Problem:   Delirium Active Problems:   Acute on chronic systolic congestive heart failure (HCC)   Respiratory failure (HCC)   Malnutrition of moderate degree   Adult failure to thrive   Palliative care by specialist   DNR (do not resuscitate) discussion   Pressure injury of skin   Ventricular tachycardia (HCC)   Atrial fibrillation with RVR (HCC)   Total Time spent with patient: 30 minutes  Subjective:   Travis Maxwell is a 69 y.o. male patient admitted with "I am all right".  HPI: Follow-up for this patient.  Last time I saw him yesterday he was very confused and irritable delirious.  We restarted olanzapine round-the-clock.  Came to see the patient this afternoon.  He was awake sitting up in bed.  One of his legs was out of the bed and onto the floor but he was not trying to climb out of bed.  He asked me appropriately if I would help him put his leg back in the bed and I did so when he was cooperative.  Patient knew that he was in the hospital and that he had been here for a while.  He did still seem to have some confusion.  Said he had "some work" to do.  He was not cursing and not agitated.  Did not say anything about wanting to die.  Generally cooperative with treatment.  Looks like his 2:00 olanzapine has not yet been given but that he got the 1 earlier in the day.  Past Psychiatric History: No known past psychiatric history  Risk to Self:   Risk to Others:   Prior Inpatient Therapy:   Prior Outpatient Therapy:    Past Medical History:  Past Medical History:  Diagnosis Date  . CAD (coronary artery disease)   . Chronic systolic (congestive) heart failure (HCC)   . Diabetes mellitus without complication (HCC)   . GERD  (gastroesophageal reflux disease)   . Gout   . Hx of aortic valve replacement    Bioprosthetic valve  . Hypertension     Past Surgical History:  Procedure Laterality Date  . CORONARY STENT INTERVENTION N/A 07/25/2017   Procedure: CORONARY STENT INTERVENTION;  Surgeon: Alwyn Pea, MD;  Location: [facility] INVASIVE CV LAB;  Service: Cardiovascular;  Laterality: N/A;  . LEFT HEART CATH AND CORONARY ANGIOGRAPHY N/A 07/25/2017   Procedure: LEFT HEART CATH AND CORONARY ANGIOGRAPHY;  Surgeon: Lamar Blinks, MD;  Location: [facility] INVASIVE CV LAB;  Service: Cardiovascular;  Laterality: N/A;  . RIGHT/LEFT HEART CATH AND CORONARY ANGIOGRAPHY N/A 01/24/2020   Procedure: RIGHT/LEFT HEART CATH AND CORONARY ANGIOGRAPHY;  Surgeon: Iran Ouch, MD;  Location: [facility] INVASIVE CV LAB;  Service: Cardiovascular;  Laterality: N/A;   Family History:  Family History  Problem Relation Age of Onset  . CAD Mother   . CAD Father    Family Psychiatric  History: None reported Social History:  Social History   Substance and Sexual Activity  Alcohol Use Not Currently     Social History   Substance and Sexual Activity  Drug Use Yes  . Types: Marijuana   Comment: today    Social History   Socioeconomic History  . Marital status: Divorced    Spouse name: Not on file  . Number of children: Not on file  .  Years of education: Not on file  . Highest education level: Not on file  Occupational History  . Not on file  Tobacco Use  . Smoking status: Current Every Day Smoker    Packs/day: 0.50    Types: Cigarettes    Last attempt to quit: 08/10/2017    Years since quitting: 2.5  . Smokeless tobacco: Never Used  Vaping Use  . Vaping Use: Never used  Substance and Sexual Activity  . Alcohol use: Not Currently  . Drug use: Yes    Types: Marijuana    Comment: today  . Sexual activity: Not on file  Other Topics Concern  . Not on file  Social History Narrative  . Not on file   Social Determinants  of Health   Financial Resource Strain: Not on file  Food Insecurity: Not on file  Transportation Needs: Not on file  Physical Activity: Not on file  Stress: Not on file  Social Connections: Not on file   Additional Social History:    Allergies:   Allergies  Allergen Reactions  . Penicillins Other (See Comments)    Has patient had a PCN reaction causing immediate rash, facial/tongue/throat swelling, SOB or lightheadedness with hypotension: Unknown Has patient had a PCN reaction causing severe rash involving mucus membranes or skin necrosis: Unknown Has patient had a PCN reaction that required hospitalization: Unknown Has patient had a PCN reaction occurring within the last 10 years: No If all of the above answers are "NO", then may proceed with Cephalosporin use.     Labs:  Results for orders placed or performed during the hospital encounter of 01/06/20 (from the past 48 hour(s))  Glucose, capillary     Status: Abnormal   Collection Time: 02/16/20  7:34 PM  Result Value Ref Range   Glucose-Capillary 119 (H) 70 - 99 mg/dL    Comment: Glucose reference range applies only to samples taken after fasting for at least 8 hours.  Glucose, capillary     Status: Abnormal   Collection Time: 02/17/20 12:01 AM  Result Value Ref Range   Glucose-Capillary 150 (H) 70 - 99 mg/dL    Comment: Glucose reference range applies only to samples taken after fasting for at least 8 hours.  Glucose, capillary     Status: Abnormal   Collection Time: 02/17/20  5:15 AM  Result Value Ref Range   Glucose-Capillary 128 (H) 70 - 99 mg/dL    Comment: Glucose reference range applies only to samples taken after fasting for at least 8 hours.  CBC with Differential/Platelet     Status: Abnormal   Collection Time: 02/17/20  6:40 AM  Result Value Ref Range   WBC 12.1 (H) 4.0 - 10.5 K/uL   RBC 7.63 (H) 4.22 - 5.81 MIL/uL   Hemoglobin 17.0 13.0 - 17.0 g/dL   HCT 51.0 (H) 25.8 - 52.7 %   MCV 71.6 (L) 80.0 - 100.0  fL   MCH 22.3 (L) 26.0 - 34.0 pg   MCHC 31.1 30.0 - 36.0 g/dL   RDW 78.2 (H) 42.3 - 53.6 %   Platelets 455 (H) 150 - 400 K/uL   nRBC 0.0 0.0 - 0.2 %   Neutrophils Relative % 54 %   Neutro Abs 6.5 1.7 - 7.7 K/uL   Lymphocytes Relative 25 %   Lymphs Abs 3.1 0.7 - 4.0 K/uL   Monocytes Relative 9 %   Monocytes Absolute 1.1 (H) 0.1 - 1.0 K/uL   Eosinophils Relative 8 %  Eosinophils Absolute 1.0 (H) 0.0 - 0.5 K/uL   Basophils Relative 3 %   Basophils Absolute 0.4 (H) 0.0 - 0.1 K/uL   WBC Morphology MORPHOLOGY UNREMARKABLE    Smear Review Normal platelet morphology    Immature Granulocytes 1 %   Abs Immature Granulocytes 0.07 0.00 - 0.07 K/uL   Polychromasia PRESENT    Target Cells PRESENT     Comment: Performed at [facility], [address] [place], [place] [address]  Glucose, capillary     Status: Abnormal   Collection Time: 02/17/20  7:59 AM  Result Value Ref Range   Glucose-Capillary 121 (H) 70 - 99 mg/dL    Comment: Glucose reference range applies only to samples taken after fasting for at least 8 hours.  Glucose, capillary     Status: Abnormal   Collection Time: 02/17/20  4:12 PM  Result Value Ref Range   Glucose-Capillary 157 (H) 70 - 99 mg/dL    Comment: Glucose reference range applies only to samples taken after fasting for at least 8 hours.  Glucose, capillary     Status: Abnormal   Collection Time: 02/17/20  7:31 PM  Result Value Ref Range   Glucose-Capillary 133 (H) 70 - 99 mg/dL    Comment: Glucose reference range applies only to samples taken after fasting for at least 8 hours.   Comment 1 Notify RN    Comment 2 Document in Chart   Glucose, capillary     Status: Abnormal   Collection Time: 02/18/20 12:45 AM  Result Value Ref Range   Glucose-Capillary 188 (H) 70 - 99 mg/dL    Comment: Glucose reference range applies only to samples taken after fasting for at least 8 hours.  Glucose, capillary     Status: Abnormal   Collection Time: 02/18/20  4:01 AM   Result Value Ref Range   Glucose-Capillary 140 (H) 70 - 99 mg/dL    Comment: Glucose reference range applies only to samples taken after fasting for at least 8 hours.  Phosphorus     Status: None   Collection Time: 02/18/20  7:54 AM  Result Value Ref Range   Phosphorus 3.3 2.5 - 4.6 mg/dL    Comment: Performed at [facility], [address] [place], [place] [address]  CBC with Differential/Platelet     Status: Abnormal   Collection Time: 02/18/20  7:54 AM  Result Value Ref Range   WBC 10.2 4.0 - 10.5 K/uL   RBC 7.32 (H) 4.22 - 5.81 MIL/uL   Hemoglobin 16.6 13.0 - 17.0 g/dL   HCT 46.6 (H) 59.9 - 35.7 %   MCV 71.3 (L) 80.0 - 100.0 fL   MCH 22.7 (L) 26.0 - 34.0 pg   MCHC 31.8 30.0 - 36.0 g/dL   RDW 01.7 (H) 79.3 - 90.3 %   Platelets 422 (H) 150 - 400 K/uL   nRBC 0.0 0.0 - 0.2 %   Neutrophils Relative % 57 %   Neutro Abs 5.8 1.7 - 7.7 K/uL   Lymphocytes Relative 23 %   Lymphs Abs 2.3 0.7 - 4.0 K/uL   Monocytes Relative 9 %   Monocytes Absolute 0.9 0.1 - 1.0 K/uL   Eosinophils Relative 8 %   Eosinophils Absolute 0.8 (H) 0.0 - 0.5 K/uL   Basophils Relative 2 %   Basophils Absolute 0.2 (H) 0.0 - 0.1 K/uL   Immature Granulocytes 1 %   Abs Immature Granulocytes 0.06 0.00 - 0.07 K/uL    Comment: Performed  at [facility], [address] [place], [place] [address]  Glucose, capillary     Status: Abnormal   Collection Time: 02/18/20  9:16 AM  Result Value Ref Range   Glucose-Capillary 152 (H) 70 - 99 mg/dL    Comment: Glucose reference range applies only to samples taken after fasting for at least 8 hours.   Comment 1 Notify RN   Glucose, capillary     Status: Abnormal   Collection Time: 02/18/20 11:40 AM  Result Value Ref Range   Glucose-Capillary 114 (H) 70 - 99 mg/dL    Comment: Glucose reference range applies only to samples taken after fasting for at least 8 hours.   Comment 1 Notify RN   Glucose, capillary     Status: Abnormal   Collection Time:  02/18/20  4:11 PM  Result Value Ref Range   Glucose-Capillary 152 (H) 70 - 99 mg/dL    Comment: Glucose reference range applies only to samples taken after fasting for at least 8 hours.   Comment 1 Notify RN     Current Facility-Administered Medications  Medication Dose Route Frequency Provider Last Rate Last Admin  . 0.9 %  sodium chloride infusion   Intravenous PRN Vida Rigger, MD   Stopped at 02/05/20 1043  . acetaminophen (TYLENOL) tablet 650 mg  650 mg Oral Q4H PRN Erin Fulling, MD   650 mg at 02/18/20 1010  . albuterol (VENTOLIN HFA) 108 (90 Base) MCG/ACT inhaler 2 puff  2 puff Inhalation Q4H PRN Lorine Bears A, MD      . allopurinol (ZYLOPRIM) tablet 300 mg  300 mg Oral Daily Belia Heman, Kurian, MD   300 mg at 02/18/20 1010  . amiodarone (PACERONE) tablet 200 mg  200 mg Oral BID Lorine Bears A, MD   200 mg at 02/18/20 1010  . apixaban (ELIQUIS) tablet 5 mg  5 mg Oral BID Vida Rigger, MD   5 mg at 02/18/20 1010  . atorvastatin (LIPITOR) tablet 40 mg  40 mg Oral QHS Erin Fulling, MD   40 mg at 02/17/20 2134  . bisacodyl (DULCOLAX) suppository 10 mg  10 mg Rectal Daily PRN Pokhrel, Laxman, MD   10 mg at 02/08/20 0619  . carvedilol (COREG) tablet 6.25 mg  6.25 mg Oral BID WC Debbe Odea, MD   6.25 mg at 02/18/20 1010  . chlorhexidine (PERIDEX) 0.12 % solution 15 mL  15 mL Mouth Rinse BID Rust-Chester, Britton L, NP   15 mL at 02/18/20 1011  . docusate (COLACE) 50 MG/5ML liquid 100 mg  100 mg Oral BID Erin Fulling, MD   100 mg at 02/18/20 1016  . famotidine (PEPCID) tablet 20 mg  20 mg Oral Daily Sharen Hones, RPH   20 mg at 02/18/20 1010  . feeding supplement (ENSURE ENLIVE / ENSURE PLUS) liquid 237 mL  237 mL Oral TID BM Pokhrel, Laxman, MD   237 mL at 02/17/20 1432  . furosemide (LASIX) tablet 20 mg  20 mg Oral Daily Eula Listen M, PA-C   20 mg at 02/18/20 1011  . insulin aspart (novoLOG) injection 0-9 Units  0-9 Units Subcutaneous Q4H Iran Ouch, MD   2 Units at  02/18/20 1008  . LORazepam (ATIVAN) injection 2 mg  2 mg Intravenous Q4H PRN Swayze, Ava, DO   2 mg at 02/17/20 1705  . MEDLINE mouth rinse  15 mL Mouth Rinse q12n4p Rust-Chester, Britton L, NP   15 mL at 02/18/20 1210  . melatonin  tablet 5 mg  5 mg Oral QHS Vida Rigger, MD   5 mg at 02/17/20 2134  . metoprolol tartrate (LOPRESSOR) injection 5 mg  5 mg Intravenous Q2H PRN Iran Ouch, MD   5 mg at 01/25/20 1259  . mirtazapine (REMERON SOL-TAB) disintegrating tablet 15 mg  15 mg Oral QHS Atasha Colebank T, MD      . multivitamin with minerals tablet 1 tablet  1 tablet Oral Daily Pokhrel, Laxman, MD   1 tablet at 02/18/20 1011  . nicotine (NICODERM CQ - dosed in mg/24 hours) patch 21 mg  21 mg Transdermal Daily Swayze, Ava, DO   21 mg at 02/18/20 1013  . OLANZapine (ZYPREXA) injection 5 mg  5 mg Intramuscular Q8H PRN Swayze, Ava, DO      . OLANZapine (ZYPREXA) tablet 5 mg  5 mg Oral Q8H Belue, Lendon Collar, RPH   5 mg at 02/18/20 0607  . ondansetron (ZOFRAN) injection 4 mg  4 mg Intravenous Q6H PRN Lorine Bears A, MD      . polyethylene glycol (MIRALAX / GLYCOLAX) packet 17 g  17 g Oral Daily Erin Fulling, MD   17 g at 02/18/20 1013  . polyethylene glycol (MIRALAX / GLYCOLAX) packet 17 g  17 g Oral Daily PRN Erin Fulling, MD      . potassium chloride (KLOR-CON) packet 20 mEq  20 mEq Oral Daily Sharen Hones, RPH   20 mEq at 02/18/20 1011  . sacubitril-valsartan (ENTRESTO) 24-26 mg per tablet  1 tablet Oral BID End, Christopher, MD   1 tablet at 02/18/20 1013  . senna-docusate (Senokot-S) tablet 1 tablet  1 tablet Oral BID Erin Fulling, MD   1 tablet at 02/18/20 1010  . sodium chloride flush (NS) 0.9 % injection 10-40 mL  10-40 mL Intracatheter Q12H Lorine Bears A, MD   10 mL at 02/18/20 1013  . sodium chloride flush (NS) 0.9 % injection 3 mL  3 mL Intravenous Q12H Lorine Bears A, MD   3 mL at 02/18/20 1014  . spironolactone (ALDACTONE) tablet 12.5 mg  12.5 mg Oral Daily Debbe Odea, MD   12.5 mg at 02/18/20 1010    Musculoskeletal: Strength & Muscle Tone: decreased Gait & Station: unsteady Patient leans: N/A  Psychiatric Specialty Exam: Physical Exam Vitals and nursing note reviewed.  Constitutional:      Appearance: He is well-developed and well-nourished.  HENT:     Head: Normocephalic and atraumatic.  Eyes:     Conjunctiva/sclera: Conjunctivae normal.     Pupils: Pupils are equal, round, and reactive to light.  Cardiovascular:     Heart sounds: Normal heart sounds.  Pulmonary:     Effort: Pulmonary effort is normal.  Abdominal:     Palpations: Abdomen is soft.  Musculoskeletal:        General: Normal range of motion.     Cervical back: Normal range of motion.  Skin:    General: Skin is warm and dry.  Neurological:     General: No focal deficit present.     Mental Status: He is alert.  Psychiatric:        Attention and Perception: He is inattentive.        Mood and Affect: Affect is blunt.        Speech: Speech is delayed and tangential.        Behavior: Behavior is withdrawn.        Thought Content: Thought content does not include homicidal  or suicidal ideation.        Cognition and Memory: Cognition is impaired. Memory is impaired.        Judgment: Judgment is impulsive.     Review of Systems  Constitutional: Negative.   HENT: Negative.   Eyes: Negative.   Respiratory: Negative.   Cardiovascular: Negative.   Gastrointestinal: Negative.   Musculoskeletal: Negative.   Skin: Negative.   Neurological: Negative.   Psychiatric/Behavioral: Negative.     Blood pressure 108/69, pulse 67, temperature 98.4 F (36.9 C), temperature source Oral, resp. rate 20, height 5' 7.01" (1.702 m), weight 69.4 kg, SpO2 97 %.Body mass index is 23.96 kg/m.  General Appearance: Casual  Eye Contact:  Minimal  Speech:  Slow  Volume:  Decreased  Mood:  Euthymic  Affect:  Constricted  Thought Process:  Disorganized  Orientation:  Negative  Thought  Content:  Tangential  Suicidal Thoughts:  No  Homicidal Thoughts:  No  Memory:  Immediate;   Fair Recent;   Poor Remote;   Fair  Judgement:  Fair  Insight:  Shallow  Psychomotor Activity:  Decreased  Concentration:  Concentration: Fair  Recall:  [employer] of Knowledge:  Fair  Language:  Fair  Akathisia:  No  Handed:  Right  AIMS (if indicated):     Assets:  Desire for Improvement  ADL's:  Impaired  Cognition:  Impaired,  Mild  Sleep:        Treatment Plan Summary: Medication management and Plan Certainly seems much better today.  No sitter in the room.  Cooperative.  Still not talking very much and seems to have some cognitive impairment but much more lucid than before.  No sign of any side effects from the olanzapine.  If he is still here for another day we might want to recheck an EKG to make sure it is not making a big difference to his QTC.  Otherwise for now no change to medication orders.  Disposition: Patient does not meet criteria for psychiatric inpatient admission. Supportive therapy provided about ongoing stressors.  Mordecai Rasmussen, MD 02/18/2020 4:13 PM

## 2020-02-18 NOTE — Plan of Care (Signed)

## 2020-02-18 NOTE — Progress Notes (Signed)
OT Cancellation Note  Patient Details Name: Travis Maxwell MRN: [id number] DOB: October 09, 1950   Cancelled Treatment:    Reason Eval/Treat Not Completed: Fatigue/lethargy limiting ability to participate. Per RN/PT, pt very lethargic, having difficulty taking meds this date. Of note, increased agitation noted for a few days. Unable to participate with therapy at this time. Will re-attempt another date.  Richrd Prime, MPH, MS, OTR/L ascom [phone] 02/18/20, 1:14 PM

## 2020-02-18 NOTE — Progress Notes (Signed)
PROGRESS NOTE    Travis Maxwell  BPZ:[id number]  DOB: 06-25-1950  DOA: 01/06/2020 PCP: [facility] Out[facility] Specialists:   Hospital course:  69 year old African-American male with past medical history of hypertension, diabetes mellitus type 2, hyperlipidemia, GERD, coronary artery disease status post PCI, history of aortic valve replacement on Eliquis presented to the hospital with respiratory distress, chest pain and shortness of breath on 01/06/2020.  He also had orthopnea and leg swelling.  Initially, patient was put on BiPAP but his breathing status worsened so he was emergently intubated and admitted to the hospital.  Patient was noted to be in severe cardiogenic shock on 01/07/2020.  Vasopressors were weaned off on 11/ 6/21 and patient was extubated to BiPAP at that time but patient was subsequently intubated on 01/19/2020.  Patient also had severe metabolic acidosis.  EKG showed some concerns for ST elevation but did not meet ST elevation MI criteria as per cardiology.  Patient received IV Cardizem and have a borderline elevated troponins.  BNP on admission was 1046.  Patient had a chest x-ray which showed vascular congestion.  Of note, patient has history of Covid pneumonia on 12/04/2019.  Patient remained extubated and was off pressors on 01/29/20 and was subsequently considered for transfer to medical service.  Patient has remained encephalopathic with a poor oral intake.  Patient was started on NG tube and tube feeding was started on 02/03/2020.  Has had episodes of agitation and confusion requiring sedation in 1-1 sitter. Today he is resting comfortably without a sitter.   PT/OT have evaluated the patient and have recommended SNF placement. TOC has been consulted to assist with placement.  Overnight of 02/14/2020 - 02/15/2020 the patient became very agitated and combative striking a nurse. At the same time the patient developed a QT prolongation and antipsychotics  could not be used. Ultimately ativan was used. Electrolytes were checked and were within normal limits. EKG will be followed for resolution of QT prolongation. No psychiatric coverage was available over this weekend at [facility]. Psychiatry has been re-consulted.  Subjective:  Patient is lethargic, but awake. No new complaints.  Objective: Vitals:   02/18/20 0935 02/18/20 1139 02/18/20 1224 02/18/20 1526  BP: 137/71 (!) 93/57 100/64 108/69  Pulse: 66 65 72 67  Resp: 17 20  20   Temp: (!) 97.5 F (36.4 C) 97.9 F (36.6 C)  98.4 F (36.9 C)  TempSrc: Oral Oral  Oral  SpO2: 100% 100%  97%  Weight:      Height:        Intake/Output Summary (Last 24 hours) at 02/18/2020 1645 Last data filed at 02/18/2020 0254 Gross per 24 hour  Intake 0 ml  Output --  Net 0 ml   Filed Weights   02/13/20 0316 02/15/20 0341 02/16/20 0544  Weight: 71.2 kg 71.7 kg 69.4 kg   Exam:  Constitutional:  . The patient is awake, but calm at this point.  No acute distress. Respiratory:  . No increased work of breathing. . No wheezes, rales, or rhonchi . No tactile fremitus Cardiovascular:  . Regular rate and rhythm . No murmurs, ectopy, or gallups. . No lateral PMI. No thrills. Abdomen:  . Abdomen is soft, non-tender, non-distended . No hernias, masses, or organomegaly . Normoactive bowel sounds.  Musculoskeletal:  . No cyanosis, clubbing, or edema Skin:  . No rashes, lesions, ulcers . palpation of skin: no induration or nodules Neurologic:  . Unable to evaluate as the patient is unable  to cooperate with exam. Psychiatric:  Pt is calm and cooperative today.  Consultants:  PCCM  Cardiology  Palliative care  Psychiatry  Procedures:  Status post intubation and extubation  Cardiac catheterization 01/14/2020  NGT placement 02/02/2020  Data Reviewed:  Basic Metabolic Panel: Recent Labs  Lab 02/12/20 0428 02/15/20 0610 02/18/20 0754  NA 139 137  --   K 4.0 4.2  --   CL 109 105   --   CO2 21* 21*  --   GLUCOSE 109* 121*  --   BUN 28* 23  --   CREATININE 0.99 0.93  --   CALCIUM 8.9 9.5  --   MG 1.8 1.9  --   PHOS 3.2 3.2 3.3   Liver Function Tests: Recent Labs  Lab 02/12/20 0428  AST 17  ALT 11  ALKPHOS 57  BILITOT 1.0  PROT 7.0  ALBUMIN 3.2*   No results for input(s): LIPASE, AMYLASE in the last 168 hours. No results for input(s): AMMONIA in the last 168 hours. CBC: Recent Labs  Lab 02/14/20 0711 02/15/20 0610 02/16/20 0634 02/17/20 0640 02/18/20 0754  WBC 11.6* 10.4 11.6* 12.1* 10.2  NEUTROABS 6.7 6.0 6.5 6.5 5.8  HGB 16.6 17.0 18.5* 17.0 16.6  HCT 53.4* 52.6* 58.0* 54.6* 52.2*  MCV 71.3* 71.0* 70.1* 71.6* 71.3*  PLT 410* 377 477* 455* 422*   Cardiac Enzymes: No results for input(s): CKTOTAL, CKMB, CKMBINDEX, TROPONINI in the last 168 hours. BNP (last 3 results) No results for input(s): PROBNP in the last 8760 hours. CBG: Recent Labs  Lab 02/18/20 0045 02/18/20 0401 02/18/20 0916 02/18/20 1140 02/18/20 1611  GLUCAP 188* 140* 152* 114* 152*    No results found for this or any previous visit (from the past 240 hour(s)).    Studies: No results found.   Scheduled Meds: . allopurinol  300 mg Oral Daily  . amiodarone  200 mg Oral BID  . apixaban  5 mg Oral BID  . atorvastatin  40 mg Oral QHS  . carvedilol  6.25 mg Oral BID WC  . chlorhexidine  15 mL Mouth Rinse BID  . docusate  100 mg Oral BID  . famotidine  20 mg Oral Daily  . feeding supplement  237 mL Oral TID BM  . furosemide  20 mg Oral Daily  . insulin aspart  0-9 Units Subcutaneous Q4H  . mouth rinse  15 mL Mouth Rinse q12n4p  . melatonin  5 mg Oral QHS  . mirtazapine  15 mg Oral QHS  . multivitamin with minerals  1 tablet Oral Daily  . nicotine  21 mg Transdermal Daily  . OLANZapine  5 mg Oral Q8H  . polyethylene glycol  17 g Oral Daily  . potassium chloride  20 mEq Oral Daily  . sacubitril-valsartan  1 tablet Oral BID  . senna-docusate  1 tablet Oral BID  .  sodium chloride flush  10-40 mL Intracatheter Q12H  . sodium chloride flush  3 mL Intravenous Q12H  . spironolactone  12.5 mg Oral Daily   Continuous Infusions: . sodium chloride Stopped (02/05/20 1043)    Principal Problem:   Delirium Active Problems:   Acute on chronic systolic congestive heart failure (HCC)   Respiratory failure (HCC)   Malnutrition of moderate degree   Adult failure to thrive   Palliative care by specialist   DNR (do not resuscitate) discussion   Pressure injury of skin   Ventricular tachycardia (HCC)   Atrial fibrillation with RVR (HCC)  Assessment & Plan:   Encephalopathy and delirium likely secondary to hypoxic encephalopathy and possible dementia: Appreciate ongoing psychiatric input with changes in Zyprexa dosage. QTC remains prolonged at 593.  Cardiology has signed off. Awaiting placement. Pt had not needed a sitter for a 2 days until last night he became severely agitated last night. Overnight of 02/14/2020 - 02/15/2020 the patient became very agitated and combative striking a nurse. At the same time the patient developed a QT prolongation and antipsychotics could not be used. Ultimately ativan was used. Electrolytes were checked and were within normal limits. EKG will be followed for resolution of QT prolongation. No psychiatric coverage is available this weekend at [facility]. This patient is clearly appropriate for inpatient psychiatric admission and treatment. Pt is calm at this point, so inpatient psychiatry again states not appropriate for inpatient psychiatric treatment.  HFrEF: Nonischemic cardiomyopathy, EF of 20 to 25% Cardiac catheterization 01/24/2020 with nonobstructive disease Continue diuresis, beta-blockers and Entresto. Cardiology has signed off.   S/P episode of VT subsequent cardiogenic shock: Status post cardioversion. Presently on oral amiodarone Daryl Eastern is to keep potassium greater than 4 and magnesium greater than 2. Cardiology has signed off.  Monitor EKG for QT prolongation resolution and signs that the antipsychotics can be restarted.  PAF: On Coreg and amiodarone. Continue Eliquis. Cardiology has signed off.   S/P Pneumonia: Patient is status post treatment for Pseudomonas and MRSA pneumonia per tracheal culture. Completed antibiotics 01/28/2020.  Moderate protein calorie malnutrition: Due to decreased oral intake Per patient's daughter, no tube feedings are warranted, she would like to have her father eat by himself. On dysphagia 1 diet.  Nutrition Status: Nutrition Problem: Moderate Malnutrition Etiology: chronic illness (CHF) Signs/Symptoms: moderate fat depletion,moderate muscle depletion Interventions: Tube feeding,MVI  Stage II sacral decub: Continue wound care  I have seen and examined this patient myself. I have spent 30 minutes in his evaluation and care.  DVT prophylaxis: On Eliquis Code Status: Full Family Communication: None available. Disposition Plan:   Patient is from: Home  Anticipated Discharge Location: SNF  Barriers to Discharge: Lack of a safe discharge  Is patient medically stable for Discharge: No  Sharia Averitt, [facility]  If 7PM-7AM, please contact night-coverage [email or web address] Password [facility] 02/18/2020, 4:47   LOS: 43 days

## 2020-02-18 NOTE — TOC Progression Note (Signed)
Transition of Care [facility]) - Progression Note    Patient Details  Name: Travis Maxwell MRN: [id number] Date of Birth: 08/09/1950  Transition of [facility] CM/SW Contact  Eilleen Kempf, LCSW Phone Number: 02/18/2020, 3:19 PM  Clinical Narrative:    CSW notified patient ready for discharge. Contacted SNF to check on bed and insurance authorization. [facility] has received auth, but patient must be without a sitter for 24 hours before they will take him. The plan now is for patient to go in the morning to [facility] provided he does not require a sitter tonight. A rapid covid has been ordered as well. just went by and talked with him, I told him the plan is to go to [facility] in the morning, I ask if he is okay with that. He stated he wants to get out of the hospital, every time he moves the bed alarm goes off and it is very loud and scary. When it goes off the nurses comes in and is unhappy with him. While I was with him, he ask for help removing some of his covers because the sun through the window is very hot. I removed some cover and also offered to shut the blind. He thanked me and said I'll see you in the morning right, I agreed.     Expected Discharge Plan: Skilled Nursing Facility Barriers to Discharge: Continued Medical Work up,Other (comment) (Patient continues to be medically unstable.)  Expected Discharge Plan and Services Expected Discharge Plan: Skilled Nursing Facility In-house Referral: Clinical Social Work   Post Acute Care Choice: Skilled Nursing Facility Living arrangements for the past 2 months: Single Family Home                                       Social Determinants of Health (SDOH) Interventions    Readmission Risk Interventions No flowsheet data found.

## 2020-02-18 NOTE — Progress Notes (Signed)
Nutrition Follow-up  DOCUMENTATION CODES:   Non-severe (moderate) malnutrition in context of chronic illness  INTERVENTION:   Continue Ensure Enlive po TID, each supplement provides 350 kcal and 20 grams of protein  Add Magic cup TID with meals, each supplement provides 290 kcal and 9 grams of protein  MVI daily  NUTRITION DIAGNOSIS:   Moderate Malnutrition related to chronic illness (CHF) as evidenced by moderate fat depletion,moderate muscle depletion. Ongoing.  GOAL:   Patient will meet greater than or equal to 90% of their needs -progressing   MONITOR:   PO intake,Supplement acceptance,Labs,Weight trends,Skin,I & O's  ASSESSMENT:   69 year old male with PMHx of HTN, DM, gout, hx AVR with bioprosthetic valve, GERD, CAD, CHF (EF 20%), recent COVID-19 11/30/2019 admitted with respiratory failure, severe metabolic acidosis, CHF exacerbation.   Pt continues to have fair appetite and oral intake in hospital; pt documented to be eating anywhere from 25-100% of meals. Pt previously drinking most of his Ensure supplements but it appears that pt is now refusing more of the supplements. Pt does have agitation and intermittent aggression. RN reports that pt ate 75% of his breakfast this morning and ate 50% of his lunch (total 1069kcal and 37g protein). RD will add Magic Cups to meal trays to help pt meet his estimated needs. Per chart, pt has remained fairly weight stable over the past month. Plan is for SNF at discharge.   Medications reviewed and include: allopurinol, colace, lasix, insulin, nicotine, miralax, MVI, melatonin, KCl, senokot, aldactone, pepcid  Labs reviewed: K 4.2 wnl, P 3.3 wnl, Mg 1.9 wnl cbgs- 188, 140, 152, 114 x 24 hrs  Diet Order:   Diet Order            DIET - DYS 1 Room service appropriate? Yes with Assist; Fluid consistency: Thin  Diet effective now                EDUCATION NEEDS:   No education needs have been identified at this time  Skin:  Skin  Assessment: Skin Integrity Issues: Skin Integrity Issues:: Stage II Stage II: sacrum (2.5cm x 1cm)  Last BM:  12/8- TYPE 4  Height:   Ht Readings from Last 1 Encounters:  01/24/20 5' 7.01" (1.702 m)   Weight:   Wt Readings from Last 1 Encounters:  02/16/20 69.4 kg   Ideal Body Weight:  67.3 kg  BMI:  Body mass index is 23.96 kg/m.  Estimated Nutritional Needs:   Kcal:  2000-2200  Protein:  110-120 grams  Fluid:  1.5-1.8 L/day or per MD  Betsey Holiday MS, RD, LDN Please refer to [facility] for RD and/or RD on-call/weekend/after hours pager

## 2020-02-18 NOTE — Progress Notes (Signed)
Mobility Specialist - Progress Note   02/18/20 1648  Mobility  Activity Ambulated in hall;Ambulated to bathroom  Level of Assistance Contact guard assist, steadying assist  Assistive Device Front wheel walker (2-hand assist, rails)  Distance Ambulated (ft) 180 ft  Mobility Response Tolerated well  Mobility performed by Mobility specialist  $Mobility charge 1 Mobility    Pre-mobility: 70 HR, 99% SpO2 Post-mobility: 68 HR, 99% SpO2   Pt was sitting in recliner upon arrival utilizing room air. Pt agreed to session. Pt was requesting assistance getting to the bathroom. Pt stood to RW with minA and began ambulating in room. Noted pt constantly running into furniture with walker, even with cleared pathway. Mobility removed RW and continued ambulation with 2-hand assist. Pt had urinal output while in restroom and performed hygiene tasks on self with supervision. Pt continued ambulation into hallway. After ~40 in hallway, pt requested to sit. Pt denied LE weakness, but did c/o feeling tired. However, pt was motivated to continue session and declined returning to room at the time. NT provided chair follow for rest of session. Pt proceeded with ambulation, no further rest breaks needed. Upon return to room, pt appears confused. Pt asked mobility tech several times if he could run to the store. Mobility reminded pt of his whereabouts and informed him that he could not leave to run to the store during stay. Overall, pt tolerated session well. Pt was left in recliner with all needs in reach and alarm set.    Filiberto Pinks Mobility Specialist 02/18/20, 5:04 PM

## 2020-02-18 NOTE — Progress Notes (Signed)
PT Cancellation Note  Patient Details Name: Travis Maxwell MRN: [id number] DOB: 15-Jul-1950   Cancelled Treatment:    Reason Eval/Treat Not Completed: Other (comment). Per RN, very lethargic, having difficulty taking meds this date. Of note, increased agitation noted for a few days. Unable to participate with therapy at this time. Will re-attempt another date.   Adeola Dennen 02/18/2020, 10:19 AM  Elizabeth Palau, PT, DPT [phone]

## 2020-02-19 ENCOUNTER — Ambulatory Visit: Payer: Medicare HMO | Admitting: Family

## 2020-02-19 LAB — GLUCOSE, CAPILLARY
Glucose-Capillary: 115 mg/dL — ABNORMAL HIGH (ref 70–99)
Glucose-Capillary: 115 mg/dL — ABNORMAL HIGH (ref 70–99)
Glucose-Capillary: 133 mg/dL — ABNORMAL HIGH (ref 70–99)
Glucose-Capillary: 153 mg/dL — ABNORMAL HIGH (ref 70–99)
Glucose-Capillary: 228 mg/dL — ABNORMAL HIGH (ref 70–99)
Glucose-Capillary: 74 mg/dL (ref 70–99)

## 2020-02-19 LAB — MAGNESIUM: Magnesium: 2 mg/dL (ref 1.7–2.4)

## 2020-02-19 LAB — CBC WITH DIFFERENTIAL/PLATELET
Abs Immature Granulocytes: 0.04 10*3/uL (ref 0.00–0.07)
Basophils Absolute: 0.3 10*3/uL — ABNORMAL HIGH (ref 0.0–0.1)
Basophils Relative: 3 %
Eosinophils Absolute: 0.9 10*3/uL — ABNORMAL HIGH (ref 0.0–0.5)
Eosinophils Relative: 9 %
HCT: 51.6 % (ref 39.0–52.0)
Hemoglobin: 16 g/dL (ref 13.0–17.0)
Immature Granulocytes: 0 %
Lymphocytes Relative: 26 %
Lymphs Abs: 2.4 10*3/uL (ref 0.7–4.0)
MCH: 22.1 pg — ABNORMAL LOW (ref 26.0–34.0)
MCHC: 31 g/dL (ref 30.0–36.0)
MCV: 71.2 fL — ABNORMAL LOW (ref 80.0–100.0)
Monocytes Absolute: 1 10*3/uL (ref 0.1–1.0)
Monocytes Relative: 11 %
Neutro Abs: 4.8 10*3/uL (ref 1.7–7.7)
Neutrophils Relative %: 51 %
Platelets: 412 10*3/uL — ABNORMAL HIGH (ref 150–400)
RBC: 7.25 MIL/uL — ABNORMAL HIGH (ref 4.22–5.81)
RDW: 26.3 % — ABNORMAL HIGH (ref 11.5–15.5)
Smear Review: NORMAL
WBC: 9.4 10*3/uL (ref 4.0–10.5)
nRBC: 0 % (ref 0.0–0.2)

## 2020-02-19 LAB — BASIC METABOLIC PANEL
Anion gap: 9 (ref 5–15)
BUN: 27 mg/dL — ABNORMAL HIGH (ref 8–23)
CO2: 23 mmol/L (ref 22–32)
Calcium: 9.1 mg/dL (ref 8.9–10.3)
Chloride: 104 mmol/L (ref 98–111)
Creatinine, Ser: 0.97 mg/dL (ref 0.61–1.24)
GFR, Estimated: >60 mL/min (ref >60)
Glucose, Bld: 108 mg/dL — ABNORMAL HIGH (ref 70–99)
Potassium: 4.6 mmol/L (ref 3.5–5.1)
Sodium: 136 mmol/L (ref 135–145)

## 2020-02-19 MED ORDER — MAGNESIUM SULFATE 2 GM/50ML IV SOLN
2.0000 g | Freq: Once | INTRAVENOUS | Status: AC
  Administered 2020-02-19: 07:00:00 2 g via INTRAVENOUS
  Filled 2020-02-19: qty 50

## 2020-02-19 NOTE — Care Management Important Message (Signed)
Important Message  Patient Details  Name: Travis Maxwell MRN: [id number] Date of Birth: 29-Nov-1950   Medicare Important Message Given:  Yes  Reviewed with [name], daughter at [phone].  Verbal consent for initial Medicare IM obtained.  Copy to be given to nursing staff to leave in room due to isolation status.   Johnell Comings 02/19/2020, 9:40 AM

## 2020-02-19 NOTE — Progress Notes (Signed)
PROGRESS NOTE    Travis Maxwell  ZOX:[id number] DOB: 09-Apr-1950 DOA: 01/06/2020 PCP: [facility]   Chief Complaint  Patient presents with  . Shortness of Breath  . Chest Pain  Brief Narrative: 69yo  African-American male with history of hypertension, diabetes, hyperlipidemia, GERD, CAD status post PCI, history of aortic valve replacement on Eliquis presented to the hospital respite distress, chest pain on 01/06/2020 along with orthopnea, leg swelling. Initially on BiPAP but breathing worsened and was emergently intubated and also noted to be in cardiogenic shock on 01/07/2020, managed in ICU initially on vasopressors weaned off 11/6, extubated to BiPAP but at that time subsequently intubated on 11/14 and also had severe metabolic acidosis EKG concerning for ST elevation but did not meet ST elevation MI criteria.  Patient was managed medically, chest x-ray showed vascular congestion, he had history of Covid pneumonia 12/04/2019.  Patient has been off pressors 11/24 extubated and subsequently transferred to medical service.  Hospital course complicated by encephalopathy poor oral intake was not NGT feeding subsequently removed, has needed one-to-one sitter but has been off seizures since.  At times he is agitated.  PT OT has been following him and has recommended skilled nursing facility placement.  He is seen by psychiatry remains on olanzapine dose has been cut down due to prolonged QTC. Seen by cardiology and has had a stable QTC in 590s and okay from cardiac standpoint to discharge to skilled nursing facility and not a candidate for life vest or ICD given his mental status  Subjective: This morning he is alert awake oriented to place, his name able to interact follows commands moving all his extremities. 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  Zyprexa dose has been cut down from 10 every 8 hours to 5 mg every 8 hours and mental status overall appears stable.  No longer needing one-to-one sitter.  He does have prolonged QTC which is noted by cardiology psychiatry and at this time continue on Zyprexa as per the recommendation.  Discussed with cardiology PA today, repeated EKG. continue supportive measures.  As per psychiatry no need for inpatient psychiatric and okay to discharge to skilled nursing facility  HFrEF/nonischemic cardiomyopathy with EF 20 to 25% status post cardiac cath 11/19 with nonobstructive disease.  Continue on diuresis, beta-blockers Entresto, monitor renal function and BMP in are stable today.  Cardiology has signed off.  Status post episode of VT subsequent cardiogenic shock status post cardioversion: Currently on amiodarone, potassium mag is stable.  Cardiology service has signed off and they have noted that his QTC is prolonged and okay and it has been in the 570s to 590s range.  PAF on Coreg and amiodarone and Eliquis.  Status post pneumonia completed treatment on 11/23 for Pseudomonas and MRSA pneumonia per tracheal culture,  Moderate protein calorie malnutrition continue to augment diet.  On dysphagia 1 diet.  Stage II sacral decubitus continue wound care  Adult failure to thrive/deconditioning debility continue PT OT and skilled nursing facility placement.  GOC-care evaluation and remains full code for now  Nutrition: Diet Order            DIET - DYS 1 Room service appropriate? Yes with Assist; Fluid consistency: Thin  Diet effective now  Nutrition Problem: Moderate Malnutrition Etiology: chronic illness (CHF) Signs/Symptoms: moderate fat depletion,moderate muscle depletion Interventions: Tube feeding,MVI  Body mass index is 23.96 kg/m.  Pressure Ulcer: Pressure  Injury 01/20/20 Sacrum Posterior;Medial Stage 2 -  Partial thickness loss of dermis presenting as a shallow open injury with a red, pink wound bed without slough. (Active)  01/20/20 0800  Location: Sacrum  Location Orientation: Posterior;Medial  Staging: Stage 2 -  Partial thickness loss of dermis presenting as a shallow open injury with a red, pink wound bed without slough.  Wound Description (Comments):   Present on Admission: No    DVT prophylaxis: SCDs Start: 01/06/20 1332 Code Status:   Code Status: Full Code  Family Communication: plan of care discussed with patient at bedside.  Status is: Inpatient Remains inpatient appropriate because:Unsafe d/c plan and Inpatient level of care appropriate due to severity of illness  Dispo: The patient is from: Home              Anticipated d/c is to: SNF              Anticipated d/c date is: 1 day              Patient currently is medically stable to d/c.         Consultants:see note  Procedures:see note  Culture/Microbiology    Component Value Date/Time   SDES EXPECTORATED SPUTUM 01/26/2020 2339   SDES  01/26/2020 2339    EXPECTORATED SPUTUM Performed at [facility], [address] [place] [place], [place] [address]    [facility] NONE 01/26/2020 2339   SPECREQUEST  01/26/2020 2339    NONE Reflexed from [id number] Performed at [facility], [address] [place] [place], [place] [address]    CULT  01/26/2020 2339    NO GROWTH 2 DAYS Performed at [facility], [address], [place], [place] [address]    REPTSTATUS 01/27/2020 FINAL 01/26/2020 2339   REPTSTATUS 01/29/2020 FINAL 01/26/2020 2339    Other culture-see note  Medications: Scheduled Meds: . allopurinol  300 mg Oral Daily  . amiodarone  200 mg Oral BID  . apixaban  5 mg Oral BID  . atorvastatin  40 mg Oral QHS  . carvedilol  6.25 mg Oral BID WC  . chlorhexidine  15 mL Mouth Rinse BID  . docusate  100 mg Oral BID  . famotidine  20 mg Oral Daily   . feeding supplement  237 mL Oral TID BM  . furosemide  20 mg Oral Daily  . insulin aspart  0-9 Units Subcutaneous Q4H  . mouth rinse  15 mL Mouth Rinse q12n4p  . melatonin  5 mg Oral QHS  . mirtazapine  15 mg Oral QHS  . multivitamin with minerals  1 tablet Oral Daily  . nicotine  21 mg Transdermal Daily  . OLANZapine  5 mg Oral Q8H  . polyethylene glycol  17 g Oral Daily  . potassium chloride  20 mEq Oral Daily  . sacubitril-valsartan  1 tablet Oral BID  . senna-docusate  1 tablet Oral BID  . sodium chloride flush  10-40 mL Intracatheter Q12H  . sodium chloride flush  3 mL Intravenous Q12H  . spironolactone  12.5 mg Oral Daily   Continuous Infusions: . sodium chloride Stopped (02/05/20 1043)    Antimicrobials: Anti-infectives (From admission, onward)   Start     Dose/Rate Route Frequency Ordered Stop   01/27/20 1000  linezolid (ZYVOX) tablet 600 mg  Status:  Discontinued        600 mg Oral Every 12 hours 01/27/20 0824 01/27/20 0855   01/27/20 1000  linezolid (ZYVOX) IVPB 600 mg        600 mg 300 mL/hr over 60 Minutes Intravenous Every 12 hours 01/27/20 0855 01/28/20 2233   01/22/20 1300  linezolid (ZYVOX) tablet 600 mg  Status:  Discontinued        600 mg Per Tube Every 12 hours 01/22/20 1200 01/27/20 0824   01/21/20 1530  ceFEPIme (MAXIPIME) 2 g in sodium chloride 0.9 % 100 mL IVPB        2 g 200 mL/hr over 30 Minutes Intravenous Every 8 hours 01/21/20 1417 01/28/20 1630   01/12/20 2200  ceFAZolin (ANCEF) IVPB 2g/100 mL premix  Status:  Discontinued        2 g 200 mL/hr over 30 Minutes Intravenous Every 8 hours 01/12/20 1429 01/15/20 1102   01/11/20 1400  ceFEPIme (MAXIPIME) 2 g in sodium chloride 0.9 % 100 mL IVPB  Status:  Discontinued        2 g 200 mL/hr over 30 Minutes Intravenous Every 8 hours 01/11/20 1016 01/12/20 1422   01/10/20 1600  ceFEPIme (MAXIPIME) 2 g in sodium chloride 0.9 % 100 mL IVPB  Status:  Discontinued        2 g 200 mL/hr over 30 Minutes  Intravenous Every 12 hours 01/10/20 1420 01/11/20 1016   01/07/20 2330  azithromycin (ZITHROMAX) 500 mg in sodium chloride 0.9 % 250 mL IVPB  Status:  Discontinued        500 mg 250 mL/hr over 60 Minutes Intravenous Every 24 hours 01/07/20 2234 01/11/20 1006   01/07/20 2330  cefTRIAXone (ROCEPHIN) 2 g in sodium chloride 0.9 % 100 mL IVPB  Status:  Discontinued        2 g 200 mL/hr over 30 Minutes Intravenous Every 24 hours 01/07/20 2234 01/10/20 1419   01/06/20 2015  remdesivir 100 mg in sodium chloride 0.9 % 100 mL IVPB  Status:  Discontinued        100 mg 200 mL/hr over 30 Minutes Intravenous  Once 01/06/20 1921 01/06/20 1931   01/06/20 2015  remdesivir 100 mg in sodium chloride 0.9 % 100 mL IVPB  Status:  Discontinued        100 mg 200 mL/hr over 30 Minutes Intravenous  Once 01/06/20 1921 01/06/20 1931   01/06/20 2015  remdesivir 100 mg in sodium chloride 0.9 % 100 mL IVPB  Status:  Discontinued        100 mg 200 mL/hr over 30 Minutes Intravenous  Once 01/06/20 1921 01/06/20 1931     Objective: Vitals: Today's Vitals   02/19/20 0429 02/19/20 0756 02/19/20 1136 02/19/20 1500  BP: (!) 95/58 113/74 102/64   Pulse: 68 65 68   Resp:  18 20   Temp:  98.4 F (36.9 C) 98.5 F (36.9 C)   TempSrc:  Oral Oral   SpO2:  96% 100%   Weight:      Height:      PainSc:    0-No pain    Intake/Output Summary (Last 24 hours) at 02/19/2020 1615 Last data filed at 02/19/2020 0337 Gross per 24 hour  Intake --  Output 300 ml  Net -300 ml   Filed Weights   02/13/20 0316 02/15/20 0341 02/16/20 0544  Weight: 71.2 kg 71.7 kg 69.4 kg   Weight change:   Intake/Output from previous day: 12/14 0701 - 12/15  0700 In: -  Out: 300 [Urine:300] Intake/Output this shift: No intake/output data recorded.  Examination: General exam: AAO ,NAD, weak appearing. HEENT:Oral mucosa moist, Ear/Nose WNL grossly,dentition normal. Respiratory system: bilaterally clear,no wheezing or crackles,no use of  accessory muscle, non tender. Cardiovascular system: S1 & S2 +, regular, No JVD. Gastrointestinal system: Abdomen soft, NT,ND, BS+. Nervous System:Alert, awake, moving extremities and grossly nonfocal Extremities: No edema, distal peripheral pulses palpable.  Skin: No rashes,no icterus. MSK: Normal muscle bulk,tone, power  Data Reviewed: I have personally reviewed following labs and imaging studies CBC: Recent Labs  Lab 02/15/20 0610 02/16/20 0634 02/17/20 0640 02/18/20 0754 02/19/20 0515  WBC 10.4 11.6* 12.1* 10.2 9.4  NEUTROABS 6.0 6.5 6.5 5.8 4.8  HGB 17.0 18.5* 17.0 16.6 16.0  HCT 52.6* 58.0* 54.6* 52.2* 51.6  MCV 71.0* 70.1* 71.6* 71.3* 71.2*  PLT 377 477* 455* 422* 412*   Basic Metabolic Panel: Recent Labs  Lab 02/15/20 0610 02/18/20 0754 02/19/20 0515  NA 137  --  136  K 4.2  --  4.6  CL 105  --  104  CO2 21*  --  23  GLUCOSE 121*  --  108*  BUN 23  --  27*  CREATININE 0.93  --  0.97  CALCIUM 9.5  --  9.1  MG 1.9  --  2.0  PHOS 3.2 3.3  --    GFR: Estimated Creatinine Clearance: 69.1 mL/min (by C-G formula based on SCr of 0.97 mg/dL). Liver Function Tests: No results for input(s): AST, ALT, ALKPHOS, BILITOT, PROT, ALBUMIN in the last 168 hours. No results for input(s): LIPASE, AMYLASE in the last 168 hours. No results for input(s): AMMONIA in the last 168 hours. Coagulation Profile: No results for input(s): INR, PROTIME in the last 168 hours. Cardiac Enzymes: No results for input(s): CKTOTAL, CKMB, CKMBINDEX, TROPONINI in the last 168 hours. BNP (last 3 results) No results for input(s): PROBNP in the last 8760 hours. HbA1C: No results for input(s): HGBA1C in the last 72 hours. CBG: Recent Labs  Lab 02/19/20 0010 02/19/20 0417 02/19/20 0733 02/19/20 1132 02/19/20 1605  GLUCAP 115* 115* 133* 153* 74   Lipid Profile: No results for input(s): CHOL, HDL, LDLCALC, TRIG, CHOLHDL, LDLDIRECT in the last 72 hours. Thyroid Function Tests: No results for  input(s): TSH, T4TOTAL, FREET4, T3FREE, THYROIDAB in the last 72 hours. Anemia Panel: No results for input(s): VITAMINB12, FOLATE, FERRITIN, TIBC, IRON, RETICCTPCT in the last 72 hours. Sepsis Labs: No results for input(s): PROCALCITON, LATICACIDVEN in the last 168 hours.  Recent Results (from the past 240 hour(s))  SARS Coronavirus 2 by RT PCR (hospital order, performed in [facility] hospital lab) Nasopharyngeal Nasopharyngeal Swab     Status: None   Collection Time: 02/18/20 10:03 PM   Specimen: Nasopharyngeal Swab  Result Value Ref Range Status   SARS Coronavirus 2 NEGATIVE NEGATIVE Final    Comment: (NOTE) SARS-CoV-2 target nucleic acids are NOT DETECTED.  The SARS-CoV-2 RNA is generally detectable in upper and lower respiratory specimens during the acute phase of infection. The lowest concentration of SARS-CoV-2 viral copies this assay can detect is 250 copies / mL. A negative result does not preclude SARS-CoV-2 infection and should not be used as the sole basis for treatment or other patient management decisions.  A negative result may occur with improper specimen collection / handling, submission of specimen other than nasopharyngeal swab, presence of viral mutation(s) within the areas targeted by this assay, and inadequate number of viral copies (<  250 copies / mL). A negative result must be combined with clinical observations, patient history, and epidemiological information.  Fact Sheet for Patients:   BoilerBrush.com.cy  Fact Sheet for Healthcare Providers: https://pope.com/  This test is not yet approved or  cleared by the [place] FDA and has been authorized for detection and/or diagnosis of SARS-CoV-2 by FDA under an Emergency Use Authorization (EUA).  This EUA will remain in effect (meaning this test can be used) for the duration of the COVID-19 declaration under Section 564(b)(1) of the Act, 21 U.S.C. section  360bbb-3(b)(1), unless the authorization is terminated or revoked sooner.  Performed at [facility], [address], [place], [place] [address]      Radiology Studies: No results found.   LOS: 44 days   Lanae Boast, MD [facility]  02/19/2020, 4:15 PM

## 2020-02-19 NOTE — Progress Notes (Signed)
Mobility Specialist - Progress Note   02/19/20 1200  Mobility  Activity Ambulated in hall  Level of Assistance Minimal assist, patient does 75% or more  Assistive Device Front wheel walker  Distance Ambulated (ft) 180 ft  Mobility Response Tolerated well  Mobility performed by Mobility specialist  $Mobility charge 1 Mobility    Pre-mobility: 72 HR, 99% SpO2 Post-mobility: 64 HR, 98% SpO2   Pt was sitting up in bed upon arrival, mobility responded to bed alarm. Pt was trying to get up to locate his wallet, mobility assured pt that all of his belongings were safely stored. Pt agreed to ambulate in hallway. CGA donned. Pt was SBA standing from EOB with 2-hand assist. Pt presents poor standing-balance with assistance. RW was provided to pt. Pt ambulated 180' in hallway. Pt consistently gravitates to his R during ambulation, requiring minA for steadying and direction. Pt's vitals difficult to obtain during ambulation d/t poor pleth. Overall, pt tolerated session well. Pt eager to go for a walk later this evening, will attempt if time permits. Pt was left in bed with alarm set and all needs in reach. Nurse notified.    Filiberto Pinks Mobility Specialist 02/19/20, 12:25 PM

## 2020-02-19 NOTE — Progress Notes (Signed)
Per NP, patient does not need to be on telemetry.

## 2020-02-19 NOTE — TOC Progression Note (Signed)
Transition of Care [facility]) - Progression Note    Patient Details  Name: Ein Rijo MRN: [id number] Date of Birth: Jun 03, 1950  Transition of Care [facility]) CM/SW Contact  Chapman Fitch, RN Phone Number: 02/19/2020, 3:50 PM  Clinical Narrative:    Anticipated discharge tomorrow.  Insurance Berkley Harvey is vaid through 12/19.  Repeat covid negative    Expected Discharge Plan: Skilled Nursing Facility Barriers to Discharge: Continued Medical Work up,Other (comment) (Patient continues to be medically unstable.)  Expected Discharge Plan and Services Expected Discharge Plan: Skilled Nursing Facility In-house Referral: Clinical Social Work   Post Acute Care Choice: Skilled Nursing Facility Living arrangements for the past 2 months: Single Family Home                                       Social Determinants of Health (SDOH) Interventions    Readmission Risk Interventions No flowsheet data found.

## 2020-02-19 NOTE — Progress Notes (Signed)
OT Cancellation Note  Patient Details Name: Travis Maxwell MRN: [id number] DOB: [birth year]   Cancelled Treatment:    Reason Eval/Treat Not Completed: Fatigue/lethargy limiting ability to participate  Upon speaking with RN, pt was becoming agitated requiring medication and is now asleep. Will f/u for OT treatment at later date/time as appropriate. Thank you.  Rejeana Brock, MS, OTR/L ascom [phone] 02/19/20, 2:13 PM

## 2020-02-20 LAB — CBC WITH DIFFERENTIAL/PLATELET
Abs Immature Granulocytes: 0.03 10*3/uL (ref 0.00–0.07)
Basophils Absolute: 0.3 10*3/uL — ABNORMAL HIGH (ref 0.0–0.1)
Basophils Relative: 3 %
Eosinophils Absolute: 0.9 10*3/uL — ABNORMAL HIGH (ref 0.0–0.5)
Eosinophils Relative: 9 %
HCT: 53.2 % — ABNORMAL HIGH (ref 39.0–52.0)
Hemoglobin: 16.4 g/dL (ref 13.0–17.0)
Immature Granulocytes: 0 %
Lymphocytes Relative: 26 %
Lymphs Abs: 2.6 10*3/uL (ref 0.7–4.0)
MCH: 22 pg — ABNORMAL LOW (ref 26.0–34.0)
MCHC: 30.8 g/dL (ref 30.0–36.0)
MCV: 71.2 fL — ABNORMAL LOW (ref 80.0–100.0)
Monocytes Absolute: 1.1 10*3/uL — ABNORMAL HIGH (ref 0.1–1.0)
Monocytes Relative: 11 %
Neutro Abs: 5 10*3/uL (ref 1.7–7.7)
Neutrophils Relative %: 51 %
Platelets: 431 10*3/uL — ABNORMAL HIGH (ref 150–400)
RBC: 7.47 MIL/uL — ABNORMAL HIGH (ref 4.22–5.81)
RDW: 26 % — ABNORMAL HIGH (ref 11.5–15.5)
WBC: 9.8 10*3/uL (ref 4.0–10.5)
nRBC: 0 % (ref 0.0–0.2)

## 2020-02-20 LAB — GLUCOSE, CAPILLARY
Glucose-Capillary: 111 mg/dL — ABNORMAL HIGH (ref 70–99)
Glucose-Capillary: 181 mg/dL — ABNORMAL HIGH (ref 70–99)
Glucose-Capillary: 190 mg/dL — ABNORMAL HIGH (ref 70–99)
Glucose-Capillary: 84 mg/dL (ref 70–99)

## 2020-02-20 MED ORDER — AMIODARONE HCL 200 MG PO TABS: 200.0000 mg | ORAL_TABLET | Freq: Two times a day (BID) | ORAL | Status: AC

## 2020-02-20 MED ORDER — DOCUSATE SODIUM 50 MG/5ML PO LIQD: 100.0000 mg | Freq: Two times a day (BID) | ORAL | 0 refills | Status: AC

## 2020-02-20 MED ORDER — SPIRONOLACTONE 25 MG PO TABS: 12.5000 mg | ORAL_TABLET | Freq: Every day | ORAL | Status: AC

## 2020-02-20 MED ORDER — CARVEDILOL 3.125 MG PO TABS
3.1250 mg | ORAL_TABLET | Freq: Two times a day (BID) | ORAL | Status: DC
  Filled 2020-02-20 (×3): qty 1

## 2020-02-20 MED ORDER — POLYETHYLENE GLYCOL 3350 17 G PO PACK: 17.0000 g | PACK | Freq: Every day | ORAL | 0 refills | Status: AC

## 2020-02-20 MED ORDER — CARVEDILOL 3.125 MG PO TABS: 3.1250 mg | ORAL_TABLET | Freq: Two times a day (BID) | ORAL | Status: AC

## 2020-02-20 MED ORDER — OLANZAPINE 5 MG PO TABS: 5.0000 mg | ORAL_TABLET | Freq: Three times a day (TID) | ORAL | Status: AC

## 2020-02-20 MED ORDER — ATORVASTATIN CALCIUM 40 MG PO TABS: 40.0000 mg | ORAL_TABLET | Freq: Every day | ORAL | Status: AC

## 2020-02-20 MED ORDER — POTASSIUM CHLORIDE CRYS ER 10 MEQ PO TBCR: 10.0000 meq | EXTENDED_RELEASE_TABLET | Freq: Every day | ORAL | Status: AC

## 2020-02-20 MED ORDER — SACUBITRIL-VALSARTAN 24-26 MG PO TABS: 1.0000 | ORAL_TABLET | Freq: Two times a day (BID) | ORAL | Status: AC

## 2020-02-20 MED ORDER — POTASSIUM CHLORIDE CRYS ER 10 MEQ PO TBCR: 10.0000 meq | EXTENDED_RELEASE_TABLET | Freq: Every day | ORAL | Status: DC

## 2020-02-20 MED ORDER — FUROSEMIDE 20 MG PO TABS
20.0000 mg | ORAL_TABLET | Freq: Every day | ORAL | Status: AC
Start: 2020-02-21 — End: ?

## 2020-02-20 NOTE — TOC Transition Note (Signed)
Transition of Care [facility]) - CM/SW Discharge Note   Patient Details  Name: Travis Maxwell MRN: [id number] Date of Birth: 1950-10-12  Transition of Care [facility]) CM/SW Contact:  Chapman Fitch, RN Phone Number: 02/20/2020, 3:02 PM   Clinical Narrative:    Patient to discharge today to [facility]  Daughter [place] Sink notified and will be coming to visit with patient prior to discharge  EMS packet printed  EMS transport arranged for 4 pm Bedside RN to call report  DC info sent in the HUB   Final next level of care: Skilled Nursing Facility Barriers to Discharge: No Barriers Identified   Patient Goals and CMS Choice        Discharge Placement              Patient chooses bed at: [facility] [place] Patient to be transferred to facility by: EMS Name of family member notified:  Sink Patient and family notified of of transfer: 02/20/20  Discharge Plan and Services In-house Referral: Clinical Social Work   Post Acute Care Choice: Skilled Nursing Facility                               Social Determinants of Health (SDOH) Interventions     Readmission Risk Interventions Readmission Risk Prevention Plan 02/20/2020  Transportation Screening Complete  Medication Review [facility]) Complete  PCP or Specialist appointment within 3-5 days of discharge (No Data)  HRI or Home Care Consult (No Data)  Palliative Care Screening Not Applicable  Skilled Nursing Facility Complete  Some recent data might be hidden

## 2020-02-20 NOTE — Consult Note (Signed)
[facility] Face-to-Face Psychiatry Consult   Reason for Consult: Follow-up 69 year old man recovering from delirium with multiple medical problems Referring Physician:  Kc Patient Identification: Travis Maxwell MRN:  [id number] Principal Diagnosis: Delirium Diagnosis:  Principal Problem:   Delirium Active Problems:   Acute on chronic systolic congestive heart failure (HCC)   Respiratory failure (HCC)   Malnutrition of moderate degree   Adult failure to thrive   Palliative care by specialist   DNR (do not resuscitate) discussion   Pressure injury of skin   Ventricular tachycardia (HCC)   Atrial fibrillation with RVR (HCC)   Total Time spent with patient: 30 minutes  Subjective:   Travis Maxwell is a 69 y.o. male patient admitted with "okay".  HPI: Update on the day of probable discharge for this gentleman.  Returning to the low-dose olanzapine seems to have struck a reasonable balance such that he is not getting agitated fighting and aggressive with staff but is not too oversedated.  Despite history of long QTC interval would recommend continuing current medicine for now  Past Psychiatric History: None really  Risk to Self:   Risk to Others:   Prior Inpatient Therapy:   Prior Outpatient Therapy:    Past Medical History:  Past Medical History:  Diagnosis Date  . CAD (coronary artery disease)   . Chronic systolic (congestive) heart failure (HCC)   . Diabetes mellitus without complication (HCC)   . GERD (gastroesophageal reflux disease)   . Gout   . Hx of aortic valve replacement    Bioprosthetic valve  . Hypertension     Past Surgical History:  Procedure Laterality Date  . CORONARY STENT INTERVENTION N/A 07/25/2017   Procedure: CORONARY STENT INTERVENTION;  Surgeon: Alwyn Pea, MD;  Location: [facility] INVASIVE CV LAB;  Service: Cardiovascular;  Laterality: N/A;  . LEFT HEART CATH AND CORONARY ANGIOGRAPHY N/A 07/25/2017   Procedure: LEFT HEART CATH AND CORONARY ANGIOGRAPHY;   Surgeon: Lamar Blinks, MD;  Location: [facility] INVASIVE CV LAB;  Service: Cardiovascular;  Laterality: N/A;  . RIGHT/LEFT HEART CATH AND CORONARY ANGIOGRAPHY N/A 01/24/2020   Procedure: RIGHT/LEFT HEART CATH AND CORONARY ANGIOGRAPHY;  Surgeon: Iran Ouch, MD;  Location: [facility] INVASIVE CV LAB;  Service: Cardiovascular;  Laterality: N/A;   Family History:  Family History  Problem Relation Age of Onset  . CAD Mother   . CAD Father    Family Psychiatric  History: None reported Social History:  Social History   Substance and Sexual Activity  Alcohol Use Not Currently     Social History   Substance and Sexual Activity  Drug Use Yes  . Types: Marijuana   Comment: today    Social History   Socioeconomic History  . Marital status: Divorced    Spouse name: Not on file  . Number of children: Not on file  . Years of education: Not on file  . Highest education level: Not on file  Occupational History  . Not on file  Tobacco Use  . Smoking status: Current Every Day Smoker    Packs/day: 0.50    Types: Cigarettes    Last attempt to quit: 08/10/2017    Years since quitting: 2.5  . Smokeless tobacco: Never Used  Vaping Use  . Vaping Use: Never used  Substance and Sexual Activity  . Alcohol use: Not Currently  . Drug use: Yes    Types: Marijuana    Comment: today  . Sexual activity: Not on file  Other Topics Concern  .  Not on file  Social History Narrative  . Not on file   Social Determinants of Health   Financial Resource Strain: Not on file  Food Insecurity: Not on file  Transportation Needs: Not on file  Physical Activity: Not on file  Stress: Not on file  Social Connections: Not on file   Additional Social History:    Allergies:   Allergies  Allergen Reactions  . Penicillins Other (See Comments)    Has patient had a PCN reaction causing immediate rash, facial/tongue/throat swelling, SOB or lightheadedness with hypotension: Unknown Has patient had a PCN  reaction causing severe rash involving mucus membranes or skin necrosis: Unknown Has patient had a PCN reaction that required hospitalization: Unknown Has patient had a PCN reaction occurring within the last 10 years: No If all of the above answers are "NO", then may proceed with Cephalosporin use.     Labs:  Results for orders placed or performed during the hospital encounter of 01/06/20 (from the past 48 hour(s))  Glucose, capillary     Status: Abnormal   Collection Time: 02/18/20  4:11 PM  Result Value Ref Range   Glucose-Capillary 152 (H) 70 - 99 mg/dL    Comment: Glucose reference range applies only to samples taken after fasting for at least 8 hours.   Comment 1 Notify RN   Glucose, capillary     Status: Abnormal   Collection Time: 02/18/20  7:48 PM  Result Value Ref Range   Glucose-Capillary 218 (H) 70 - 99 mg/dL    Comment: Glucose reference range applies only to samples taken after fasting for at least 8 hours.  SARS Coronavirus 2 by RT PCR (hospital order, performed in [facility] hospital lab) Nasopharyngeal Nasopharyngeal Swab     Status: None   Collection Time: 02/18/20 10:03 PM   Specimen: Nasopharyngeal Swab  Result Value Ref Range   SARS Coronavirus 2 NEGATIVE NEGATIVE    Comment: (NOTE) SARS-CoV-2 target nucleic acids are NOT DETECTED.  The SARS-CoV-2 RNA is generally detectable in upper and lower respiratory specimens during the acute phase of infection. The lowest concentration of SARS-CoV-2 viral copies this assay can detect is 250 copies / mL. A negative result does not preclude SARS-CoV-2 infection and should not be used as the sole basis for treatment or other patient management decisions.  A negative result may occur with improper specimen collection / handling, submission of specimen other than nasopharyngeal swab, presence of viral mutation(s) within the areas targeted by this assay, and inadequate number of viral copies (<250 copies / mL). A negative  result must be combined with clinical observations, patient history, and epidemiological information.  Fact Sheet for Patients:   BoilerBrush.com.cy  Fact Sheet for Healthcare Providers: https://pope.com/  This test is not yet approved or  cleared by the [place] FDA and has been authorized for detection and/or diagnosis of SARS-CoV-2 by FDA under an Emergency Use Authorization (EUA).  This EUA will remain in effect (meaning this test can be used) for the duration of the COVID-19 declaration under Section 564(b)(1) of the Act, 21 U.S.C. section 360bbb-3(b)(1), unless the authorization is terminated or revoked sooner.  Performed at [facility], [address] [place], [place] [address]   Glucose, capillary     Status: Abnormal   Collection Time: 02/19/20 12:10 AM  Result Value Ref Range   Glucose-Capillary 115 (H) 70 - 99 mg/dL    Comment: Glucose reference range applies only to samples taken after fasting for at  least 8 hours.  Glucose, capillary     Status: Abnormal   Collection Time: 02/19/20  4:17 AM  Result Value Ref Range   Glucose-Capillary 115 (H) 70 - 99 mg/dL    Comment: Glucose reference range applies only to samples taken after fasting for at least 8 hours.  CBC with Differential/Platelet     Status: Abnormal   Collection Time: 02/19/20  5:15 AM  Result Value Ref Range   WBC 9.4 4.0 - 10.5 K/uL   RBC 7.25 (H) 4.22 - 5.81 MIL/uL   Hemoglobin 16.0 13.0 - 17.0 g/dL   HCT 13.0 86.5 - 78.4 %   MCV 71.2 (L) 80.0 - 100.0 fL   MCH 22.1 (L) 26.0 - 34.0 pg   MCHC 31.0 30.0 - 36.0 g/dL   RDW 69.6 (H) 29.5 - 28.4 %   Platelets 412 (H) 150 - 400 K/uL   nRBC 0.0 0.0 - 0.2 %   Neutrophils Relative % 51 %   Neutro Abs 4.8 1.7 - 7.7 K/uL   Lymphocytes Relative 26 %   Lymphs Abs 2.4 0.7 - 4.0 K/uL   Monocytes Relative 11 %   Monocytes Absolute 1.0 0.1 - 1.0 K/uL   Eosinophils Relative 9 %   Eosinophils Absolute  0.9 (H) 0.0 - 0.5 K/uL   Basophils Relative 3 %   Basophils Absolute 0.3 (H) 0.0 - 0.1 K/uL   WBC Morphology MORPHOLOGY UNREMARKABLE    Smear Review Normal platelet morphology    Immature Granulocytes 0 %   Abs Immature Granulocytes 0.04 0.00 - 0.07 K/uL   Polychromasia PRESENT     Comment: Performed at [facility], [address] [place], [place] [address]  Basic metabolic panel     Status: Abnormal   Collection Time: 02/19/20  5:15 AM  Result Value Ref Range   Sodium 136 135 - 145 mmol/L   Potassium 4.6 3.5 - 5.1 mmol/L   Chloride 104 98 - 111 mmol/L   CO2 23 22 - 32 mmol/L   Glucose, Bld 108 (H) 70 - 99 mg/dL    Comment: Glucose reference range applies only to samples taken after fasting for at least 8 hours.   BUN 27 (H) 8 - 23 mg/dL   Creatinine, Ser 0.10 0.61 - 1.24 mg/dL   Calcium 9.1 8.9 - 27.2 mg/dL   GFR, Estimated >53 >66 mL/min    Comment: (NOTE) Calculated using the CKD-EPI Creatinine Equation (2021)    Anion gap 9 5 - 15    Comment: Performed at [facility], [address] [place], [place] [address]  Magnesium     Status: None   Collection Time: 02/19/20  5:15 AM  Result Value Ref Range   Magnesium 2.0 1.7 - 2.4 mg/dL    Comment: Performed at [facility], [address] [place], [place] [address]  Glucose, capillary     Status: Abnormal   Collection Time: 02/19/20  7:33 AM  Result Value Ref Range   Glucose-Capillary 133 (H) 70 - 99 mg/dL    Comment: Glucose reference range applies only to samples taken after fasting for at least 8 hours.  Glucose, capillary     Status: Abnormal   Collection Time: 02/19/20 11:32 AM  Result Value Ref Range   Glucose-Capillary 153 (H) 70 - 99 mg/dL    Comment: Glucose reference range applies only to samples taken after fasting for at least 8 hours.  Glucose, capillary     Status: None  Collection Time: 02/19/20  4:05 PM  Result Value Ref Range   Glucose-Capillary 74 70 - 99 mg/dL     Comment: Glucose reference range applies only to samples taken after fasting for at least 8 hours.  Glucose, capillary     Status: Abnormal   Collection Time: 02/19/20  8:42 PM  Result Value Ref Range   Glucose-Capillary 228 (H) 70 - 99 mg/dL    Comment: Glucose reference range applies only to samples taken after fasting for at least 8 hours.   Comment 1 Notify RN   Glucose, capillary     Status: None   Collection Time: 02/20/20 12:06 AM  Result Value Ref Range   Glucose-Capillary 84 70 - 99 mg/dL    Comment: Glucose reference range applies only to samples taken after fasting for at least 8 hours.   Comment 1 Notify RN   Glucose, capillary     Status: Abnormal   Collection Time: 02/20/20  3:54 AM  Result Value Ref Range   Glucose-Capillary 181 (H) 70 - 99 mg/dL    Comment: Glucose reference range applies only to samples taken after fasting for at least 8 hours.   Comment 1 Notify RN   CBC with Differential/Platelet     Status: Abnormal   Collection Time: 02/20/20  6:45 AM  Result Value Ref Range   WBC 9.8 4.0 - 10.5 K/uL   RBC 7.47 (H) 4.22 - 5.81 MIL/uL   Hemoglobin 16.4 13.0 - 17.0 g/dL   HCT 24.4 (H) 01.0 - 27.2 %   MCV 71.2 (L) 80.0 - 100.0 fL   MCH 22.0 (L) 26.0 - 34.0 pg   MCHC 30.8 30.0 - 36.0 g/dL   RDW 53.6 (H) 64.4 - 03.4 %   Platelets 431 (H) 150 - 400 K/uL   nRBC 0.0 0.0 - 0.2 %   Neutrophils Relative % 51 %   Neutro Abs 5.0 1.7 - 7.7 K/uL   Lymphocytes Relative 26 %   Lymphs Abs 2.6 0.7 - 4.0 K/uL   Monocytes Relative 11 %   Monocytes Absolute 1.1 (H) 0.1 - 1.0 K/uL   Eosinophils Relative 9 %   Eosinophils Absolute 0.9 (H) 0.0 - 0.5 K/uL   Basophils Relative 3 %   Basophils Absolute 0.3 (H) 0.0 - 0.1 K/uL   Immature Granulocytes 0 %   Abs Immature Granulocytes 0.03 0.00 - 0.07 K/uL    Comment: Performed at [facility], [address] [place], [place] [address]  Glucose, capillary     Status: Abnormal   Collection Time: 02/20/20  8:14 AM  Result  Value Ref Range   Glucose-Capillary 190 (H) 70 - 99 mg/dL    Comment: Glucose reference range applies only to samples taken after fasting for at least 8 hours.  Glucose, capillary     Status: Abnormal   Collection Time: 02/20/20 12:35 PM  Result Value Ref Range   Glucose-Capillary 111 (H) 70 - 99 mg/dL    Comment: Glucose reference range applies only to samples taken after fasting for at least 8 hours.    Current Facility-Administered Medications  Medication Dose Route Frequency Provider Last Rate Last Admin  . 0.9 %  sodium chloride infusion   Intravenous PRN Vida Rigger, MD   Stopped at 02/05/20 1043  . acetaminophen (TYLENOL) tablet 650 mg  650 mg Oral Q4H PRN Erin Fulling, MD   650 mg at 02/18/20 1010  . albuterol (VENTOLIN HFA) 108 (90 Base) MCG/ACT inhaler 2 puff  2 puff Inhalation Q4H PRN Lorine Bears A, MD      . allopurinol (ZYLOPRIM) tablet 300 mg  300 mg Oral Daily Belia Heman, Kurian, MD   300 mg at 02/20/20 0930  . amiodarone (PACERONE) tablet 200 mg  200 mg Oral BID Lorine Bears A, MD   200 mg at 02/20/20 0930  . apixaban (ELIQUIS) tablet 5 mg  5 mg Oral BID Vida Rigger, MD   5 mg at 02/20/20 0931  . atorvastatin (LIPITOR) tablet 40 mg  40 mg Oral QHS Erin Fulling, MD   40 mg at 02/19/20 2134  . bisacodyl (DULCOLAX) suppository 10 mg  10 mg Rectal Daily PRN Pokhrel, Laxman, MD   10 mg at 02/08/20 0619  . carvedilol (COREG) tablet 3.125 mg  3.125 mg Oral BID WC Kc, Ramesh, MD      . chlorhexidine (PERIDEX) 0.12 % solution 15 mL  15 mL Mouth Rinse BID Rust-Chester, Britton L, NP   15 mL at 02/20/20 0930  . docusate (COLACE) 50 MG/5ML liquid 100 mg  100 mg Oral BID Erin Fulling, MD   100 mg at 02/20/20 0931  . famotidine (PEPCID) tablet 20 mg  20 mg Oral Daily Sharen Hones, RPH   20 mg at 02/20/20 0931  . feeding supplement (ENSURE ENLIVE / ENSURE PLUS) liquid 237 mL  237 mL Oral TID BM Pokhrel, Laxman, MD   237 mL at 02/20/20 0931  . furosemide (LASIX) tablet 20 mg  20  mg Oral Daily Eula Listen M, PA-C   20 mg at 02/20/20 0931  . insulin aspart (novoLOG) injection 0-9 Units  0-9 Units Subcutaneous Q4H Iran Ouch, MD   2 Units at 02/20/20 0931  . LORazepam (ATIVAN) injection 2 mg  2 mg Intravenous Q4H PRN Swayze, Ava, DO   2 mg at 02/19/20 1251  . MEDLINE mouth rinse  15 mL Mouth Rinse q12n4p Rust-Chester, Britton L, NP   15 mL at 02/20/20 1246  . melatonin tablet 5 mg  5 mg Oral QHS Vida Rigger, MD   5 mg at 02/19/20 2134  . metoprolol tartrate (LOPRESSOR) injection 5 mg  5 mg Intravenous Q2H PRN Iran Ouch, MD   5 mg at 01/25/20 1259  . mirtazapine (REMERON SOL-TAB) disintegrating tablet 15 mg  15 mg Oral QHS Arkeem Harts, Jackquline Denmark, MD   15 mg at 02/19/20 2135  . multivitamin with minerals tablet 1 tablet  1 tablet Oral Daily Pokhrel, Laxman, MD   1 tablet at 02/20/20 0931  . nicotine (NICODERM CQ - dosed in mg/24 hours) patch 21 mg  21 mg Transdermal Daily Swayze, Ava, DO   21 mg at 02/20/20 0930  . OLANZapine (ZYPREXA) injection 5 mg  5 mg Intramuscular Q8H PRN Swayze, Ava, DO      . OLANZapine (ZYPREXA) tablet 5 mg  5 mg Oral Q8H Belue, Lendon Collar, RPH   5 mg at 02/19/20 2135  . ondansetron (ZOFRAN) injection 4 mg  4 mg Intravenous Q6H PRN Lorine Bears A, MD      . polyethylene glycol (MIRALAX / GLYCOLAX) packet 17 g  17 g Oral Daily Erin Fulling, MD   17 g at 02/20/20 0933  . polyethylene glycol (MIRALAX / GLYCOLAX) packet 17 g  17 g Oral Daily PRN Erin Fulling, MD      . Melene Muller ON 02/21/2020] potassium chloride (KLOR-CON) CR tablet 10 mEq  10 mEq Oral Daily Kc, Dayna Barker, MD      . sacubitril-valsartan (  ENTRESTO) 24-26 mg per tablet  1 tablet Oral BID End, Christopher, MD   1 tablet at 02/19/20 2135  . senna-docusate (Senokot-S) tablet 1 tablet  1 tablet Oral BID Erin Fulling, MD   1 tablet at 02/19/20 2134  . sodium chloride flush (NS) 0.9 % injection 10-40 mL  10-40 mL Intracatheter Q12H Lorine Bears A, MD   10 mL at 02/20/20 0930  . sodium  chloride flush (NS) 0.9 % injection 3 mL  3 mL Intravenous Q12H Lorine Bears A, MD   3 mL at 02/19/20 2200  . spironolactone (ALDACTONE) tablet 12.5 mg  12.5 mg Oral Daily Debbe Odea, MD   12.5 mg at 02/20/20 4401    Musculoskeletal: Strength & Muscle Tone: decreased Gait & Station: unsteady Patient leans: N/A  Psychiatric Specialty Exam: Physical Exam Vitals and nursing note reviewed.  Constitutional:      Appearance: He is well-developed and well-nourished.  HENT:     Head: Normocephalic and atraumatic.  Eyes:     Conjunctiva/sclera: Conjunctivae normal.     Pupils: Pupils are equal, round, and reactive to light.  Cardiovascular:     Heart sounds: Normal heart sounds.  Pulmonary:     Effort: Pulmonary effort is normal.  Abdominal:     Palpations: Abdomen is soft.  Musculoskeletal:        General: Normal range of motion.     Cervical back: Normal range of motion.  Skin:    General: Skin is warm and dry.  Neurological:     Mental Status: He is alert.  Psychiatric:        Attention and Perception: He is inattentive.        Mood and Affect: Affect is blunt.        Speech: Speech is delayed.        Behavior: Behavior is slowed.     Review of Systems  Unable to perform ROS: Mental status change  Constitutional: Negative.   HENT: Negative.   Eyes: Negative.   Respiratory: Negative.   Cardiovascular: Negative.   Gastrointestinal: Negative.   Musculoskeletal: Negative.   Skin: Negative.   Neurological: Negative.     Blood pressure 90/66, pulse 69, temperature (!) 97.4 F (36.3 C), temperature source Oral, resp. rate 18, height 5' 7.01" (1.702 m), weight 69.4 kg, SpO2 96 %.Body mass index is 23.96 kg/m.  General Appearance: Disheveled  Eye Contact:  Minimal  Speech:  Slow  Volume:  Decreased  Mood:  Dysphoric  Affect:  Constricted  Thought Process:  Disorganized  Orientation:  Full (Time, Place, and Person)  Thought Content:  Illogical  Suicidal  Thoughts:  No  Homicidal Thoughts:  No  Memory:  Immediate;   Fair Recent;   Poor Remote;   Poor  Judgement:  Impaired  Insight:  Shallow  Psychomotor Activity:  Decreased  Concentration:  Concentration: Poor  Recall:  Fair  Fund of Knowledge:  Fair  Language:  Fair  Akathisia:  No  Handed:  Right  AIMS (if indicated):     Assets:  Desire for Improvement Social Support  ADL's:  Impaired  Cognition:  Impaired,  Mild and Moderate  Sleep:        Treatment Plan Summary: Medication management and Plan Recovering from multiple medical issues.  Without the olanzapine he returned to being irritable angry easily confused trying to crawl out of bed and endangering himself.  With the low dose of 5 mg 3 times a day he is able to  cooperate appropriately with treatment.  Olanzapine is relatively safe compared to other antipsychotics in terms of cardiac issues and previous trials of benzodiazepines were not only ineffective but probably counterproductive.  Therefore I recommend continuing this low-dose at the time of discharge.  Case reviewed with hospitalist  Disposition: Supportive therapy provided about ongoing stressors.  Mordecai Rasmussen, MD 02/20/2020 3:00 PM

## 2020-02-20 NOTE — Progress Notes (Signed)
SLP Cancellation Note  Patient Details Name: Travis Maxwell MRN: [id number] DOB: 1950-05-09   Cancelled treatment:       Reason Eval/Treat Not Completed: Fatigue/lethargy limiting ability to participate (chart reviewed; consulted NSG/MD) Attempted to see pt this morning; GF present in room. Pt had eaten bites of puree breakfast meal and a full juice per tray present. Pt has been tolerating the current dysphagia diet as recommended by ST services while Acutely ill and hospitalized. Pt has also exhibited declined Cognitive status and awareness but improving overall each day. MD reported pt is still on some medications causing drowsiness(Ativan), which may have caused his drowsiness this morning he felt. Pt did not arouse fully to Mod+ verbal/tactile stim from SLP and GF. He instead turned over on side and closed eyes.  D/t pt's decreased desire to work w/ SLP this morning, drowsiness from meds possibly, declined Cognitive status, and Edentulous status, do Not recommend a diet upgrade at this time. Instead, recommend f/u at SNF (d/cing today per MD) for ongoing trials to upgrade diet(foods) at Lunch/Dinner meal to fully assess adequate oral management of increased textured foods over time. Also recommend Ensure supplement d/t his desire/ease to drink. Requested MD to look at any sedating medications and decrease as able as this will impact pt's engagement and safety w/ po's during the day. Oral care daily.   This was discussed w/ GF, NSG/MD who agreed.       Jerilynn Som, MS, CCC-SLP Speech Language Pathologist Rehab Services [phone] [facility] 02/20/2020, 3:12 PM

## 2020-02-20 NOTE — Discharge Summary (Addendum)
Physician Discharge Summary  Travis Maxwell YIR:[id number] DOB: 1950-11-20 DOA: 01/06/2020  PCP: [facility]  Admit date: 01/06/2020 Discharge date: 02/20/2020  Admitted From: home Disposition:  SNF  Recommendations for Outpatient Follow-up:  1. Follow up with PCP in 1-2 weeks 2. Please obtain BMP/CBC/mag in 1 wk 3. Cardiology follow up in 1 wk 4.  ekg in 2-3 days  Home Health:NO  Equipment/Devices: NONE  Discharge Condition: Stable Code Status:   Code Status: Full Code Diet recommendation:  Diet Order            DIET - DYS 1 Room service appropriate? Yes with Assist; Fluid consistency: Thin  Diet effective now                  Brief/Interim Summary:  69yo  African-American male with history of hypertension, diabetes, hyperlipidemia, GERD, CAD status post PCI, history of bioprosthetic valve, PAF on Eliquis, history of Covid 11/2019, severe systolic CHF with EF 20% presented to the hospital respite distress, chest pain on 01/06/2020-was in respiratory distress. Initially on BiPAP but breathing worsened and was emergently intubated and admitted to ICU.  Patient was found to be in cardiogenic shock, also with ST elevation EKG changes but did not meet criteria for  STEMI. Patient was managed for acute severe respiratory failure, severe metabolic acidosis with severe systolic CHF exacerbation, toxic metabolic encephalopathy, Pseudomonas pneumonia. He was  on vasopressors weaned off 11/6, extubated to BiPAP 11/6.  Hospital course complicated with arrhythmia seen by cardiology/EP for V. tach/torsades./14 patient further deteriorated needing intubation and patient remained at high risk for cardiac arrest and death./19 status post left and right heart cath no obstructive lesion, however showed normal pressure and was being treated medically.  With 11/20.  Post extubation patient had episodes Betac and was brought to normal sinus rhythm with amiodarone bolus and metoprolol.  She  had been on Precedex and was gradually weaned off. 11/24 patient was transferred to [facility] on medical floor. Hospital course complicated by encephalopathy poor oral intake was not NGT feeding subsequently removed, has needed one-to-one sitter but has been off seizures since.  At times he is agitated.  PT OT has been following him and has recommended skilled nursing facility placement.  He is seen by psychiatry remains on olanzapine dose has been cut down due to prolonged QTC. Seen by cardiology and has had a stable QTC in 590s and okay from cardiac standpoint to discharge to skilled nursing facility and not a candidate for life vest or ICD given his mental status. He remains on low dose zyprexa.  Discharge Diagnoses:   Acute encephalopathy with delirium secondary to hypoxic encephalopathy possible dementia: Patient has been followed closely by psychiatry and has been on multiple medication including Zyprexa and Remeron.  Zyprexa dose has been cut down from 10 every 8 hours to 5 mg every 8 hours and mental status overall appears stable.  No longer needing one-to-one sitter.  He does have prolonged QTC which is noted by cardiology psychiatry and at this time and advised to continue on Zyprexa as per the recommendations to manage his encephalopathy agaitaion. Discussed with cardiology PA 67/15-repeated EKG overall unchanged at this time plan is to continue current regimen and follow-up EKG at SNF and cardiology f/u as OP discussed with patient psychiatrist Dr. Toni Amend- and he recommends to continue olanzapine at this time as it helps him not to be deleriously agitated, and he feels compared to other choices olanzapine is comparatively safe.  HFrEF/nonischemic cardiomyopathy with EF 20 to 25% status post cardiac cath 11/19 with nonobstructive disease.  Continue on diuresis, beta-blockers Entresto, monitor renal function and BMP which are stable.  Patient also on potassium supplementation while on diuretics.  cardiology has signed off.  Status post episode of VT subsequent cardiogenic shock status post cardioversion: Currently on amiodarone, potassium/mag is stable.  Cardiology service has signed off and they have noted that his QTC is prolonged and okay and it has been in the 570s to 590s range.  He is not a candidate for ICD or life vest per cardiology. Per cardiology okay to cont on current meds olanzipine which has been cut down.  PAF on Coreg and amiodarone and Eliquis.  cont Coreg blood pressure has been soft so dose has been decreased dw Dunn w/ cardiology.  Status post pneumonia completed treatment on 11/23 for Pseudomonas and MRSA pneumonia per tracheal culture,  Moderate protein calorie malnutrition continue to augment diet.  On dysphagia 1 diet."Dysphagia level 1 (puree) w/ Thin liquids; general aspiration precautions; supervision to support feeding at all meals -- pt can Hold Cup to drink. Pills Crushed in Puree for safe swallowing" Stage II sacral decubitus continue wound care  Adult failure to thrive/deconditioning debility continue PT OT and skilled nursing facility placement.  GOC-care evaluation and remains full code for now I spoke with patient's daughter today over the phone and discussed abt care plan and for d/c to SNF and she is in agreement with discharge to SNF. Given his poor heart condition and complex comorbidities he is at high risk for readmission and she understands that give his poor heart condition he is at risk for further cardiac complications in the future and he will need to f/u with cardiology closely . He continues to need meds to help his encephalopathy.  Pressure Ulcer: Pressure Injury 01/20/20 Sacrum Posterior;Medial Stage 2 -  Partial thickness loss of dermis presenting as a shallow open injury with a red, pink wound bed without slough. (Active)  01/20/20 0800  Location: Sacrum  Location Orientation: Posterior;Medial  Staging: Stage 2 -  Partial  thickness loss of dermis presenting as a shallow open injury with a red, pink wound bed without slough.  Wound Description (Comments):   Present on Admission: No   Consults:  Cardiology  Subjective: Alert ,awake this am. Follows commands, oriented to place, people  Discharge Exam: Vitals:   02/20/20 0834 02/20/20 1244  BP: 99/65 90/66  Pulse: 68 69  Resp: 18 18  Temp: 98.8 F (37.1 C) (!) 97.4 F (36.3 C)  SpO2: 96% 96%   General: Pt is alert, awake, not in acute distress Cardiovascular: RRR, S1/S2 +, no rubs, no gallops Respiratory: CTA bilaterally, no wheezing, no rhonchi Abdominal: Soft, NT, ND, bowel sounds + Extremities: no edema, no cyanosis  Discharge Instructions  Discharge Instructions    Discharge instructions   Complete by: As directed    Continue offloading, to avoid pressure injury. Check.  Check EKG in 2 to 3 days while on olanzapine.Follow-up with cardiology.   Please call call MD or return to ER for similar or worsening recurring problem that brought you to hospital or if any fever,nausea/vomiting,abdominal pain, uncontrolled pain, chest pain,  shortness of breath or any other alarming symptoms.  Please follow-up your doctor as instructed in a week time and call the office for appointment.  Please avoid alcohol, smoking, or any other illicit substance and maintain healthy habits including taking your regular medications as prescribed.  You were cared for by a hospitalist during your hospital stay. If you have any questions about your discharge medications or the care you received while you were in the hospital after you are discharged, you can call the unit and ask to speak with the hospitalist on call if the hospitalist that took care of you is not available.  Once you are discharged, your primary care physician will handle any further medical issues. Please note that NO REFILLS for any discharge medications will be authorized once you are discharged,  as it is imperative that you return to your primary care physician (or establish a relationship with a primary care physician if you do not have one) for your aftercare needs so that they can reassess your need for medications and monitor your lab values   Discharge wound care:   Complete by: As directed    Continue offloading and padding  of the pressure area to avoid pressure injury   Increase activity slowly   Complete by: As directed      Allergies as of 02/20/2020      Reactions   Penicillins Other (See Comments)   Has patient had a PCN reaction causing immediate rash, facial/tongue/throat swelling, SOB or lightheadedness with hypotension: Unknown Has patient had a PCN reaction causing severe rash involving mucus membranes or skin necrosis: Unknown Has patient had a PCN reaction that required hospitalization: Unknown Has patient had a PCN reaction occurring within the last 10 years: No If all of the above answers are "NO", then may proceed with Cephalosporin use.      Medication List    STOP taking these medications   aspirin 81 MG EC tablet   colchicine 0.6 MG tablet   metoprolol succinate 25 MG 24 hr tablet Commonly known as: TOPROL-XL     TAKE these medications   albuterol 108 (90 Base) MCG/ACT inhaler Commonly known as: VENTOLIN HFA Inhale 2 puffs into the lungs every 4 (four) hours as needed for wheezing or shortness of breath.   allopurinol 300 MG tablet Commonly known as: ZYLOPRIM Take 300 mg by mouth daily.   amiodarone 200 MG tablet Commonly known as: PACERONE Take 1 tablet (200 mg total) by mouth 2 (two) times daily.   apixaban 5 MG Tabs tablet Commonly known as: ELIQUIS Take 1 tablet (5 mg total) by mouth 2 (two) times daily.   atorvastatin 40 MG tablet Commonly known as: LIPITOR Take 1 tablet (40 mg total) by mouth daily.   carvedilol 3.125 MG tablet Commonly known as: COREG Take 1 tablet (3.125 mg total) by mouth 2 (two) times daily with a meal.    docusate 50 MG/5ML liquid Commonly known as: COLACE Take 10 mLs (100 mg total) by mouth 2 (two) times daily.   furosemide 20 MG tablet Commonly known as: LASIX Take 1 tablet (20 mg total) by mouth daily. Start taking on: February 21, 2020   nitroGLYCERIN 0.4 MG SL tablet Commonly known as: NITROSTAT Place 1 tablet (0.4 mg total) under the tongue every 5 (five) minutes as needed for chest pain.   OLANZapine 5 MG tablet Commonly known as: ZYPREXA Take 1 tablet (5 mg total) by mouth every 8 (eight) hours. Check EKG  QTC in 2 days   polyethylene glycol 17 g packet Commonly known as: MIRALAX / GLYCOLAX Take 17 g by mouth daily. Start taking on: February 21, 2020   potassium chloride 10 MEQ tablet Commonly known as: KLOR-CON Take 1 tablet (10 mEq total) by  mouth daily. Start taking on: February 21, 2020   sacubitril-valsartan 24-26 MG Commonly known as: ENTRESTO Take 1 tablet by mouth 2 (two) times daily.   sitaGLIPtin 100 MG tablet Commonly known as: JANUVIA Take 100 mg by mouth daily.   spironolactone 25 MG tablet Commonly known as: ALDACTONE Take 0.5 tablets (12.5 mg total) by mouth daily. Start taking on: February 21, 2020            Discharge Care Instructions  (From admission, onward)         Start     Ordered   02/20/20 0000  Discharge wound care:       Comments: Continue offloading and padding  of the pressure area to avoid pressure injury   02/20/20 1413          Follow-up Information    [facility] Follow up in 1 week(s).   Specialty: General Practice Contact information: [address] [place] [address] [phone]        Iran Ouch, MD .   Specialty: Cardiology Contact information: [address] [place] [address] [phone]              Allergies  Allergen Reactions  . Penicillins Other (See Comments)    Has patient had a PCN reaction causing immediate rash,  facial/tongue/throat swelling, SOB or lightheadedness with hypotension: Unknown Has patient had a PCN reaction causing severe rash involving mucus membranes or skin necrosis: Unknown Has patient had a PCN reaction that required hospitalization: Unknown Has patient had a PCN reaction occurring within the last 10 years: No If all of the above answers are "NO", then may proceed with Cephalosporin use.     The results of significant diagnostics from this hospitalization (including imaging, microbiology, ancillary and laboratory) are listed below for reference.    Microbiology: Recent Results (from the past 240 hour(s))  SARS Coronavirus 2 by RT PCR (hospital order, performed in [facility] hospital lab) Nasopharyngeal Nasopharyngeal Swab     Status: None   Collection Time: 02/18/20 10:03 PM   Specimen: Nasopharyngeal Swab  Result Value Ref Range Status   SARS Coronavirus 2 NEGATIVE NEGATIVE Final    Comment: (NOTE) SARS-CoV-2 target nucleic acids are NOT DETECTED.  The SARS-CoV-2 RNA is generally detectable in upper and lower respiratory specimens during the acute phase of infection. The lowest concentration of SARS-CoV-2 viral copies this assay can detect is 250 copies / mL. A negative result does not preclude SARS-CoV-2 infection and should not be used as the sole basis for treatment or other patient management decisions.  A negative result may occur with improper specimen collection / handling, submission of specimen other than nasopharyngeal swab, presence of viral mutation(s) within the areas targeted by this assay, and inadequate number of viral copies (<250 copies / mL). A negative result must be combined with clinical observations, patient history, and epidemiological information.  Fact Sheet for Patients:   BoilerBrush.com.cy  Fact Sheet for Healthcare Providers: https://pope.com/  This test is not yet approved or  cleared by  the [place] FDA and has been authorized for detection and/or diagnosis of SARS-CoV-2 by FDA under an Emergency Use Authorization (EUA).  This EUA will remain in effect (meaning this test can be used) for the duration of the COVID-19 declaration under Section 564(b)(1) of the Act, 21 U.S.C. section 360bbb-3(b)(1), unless the authorization is terminated or revoked sooner.  Performed at [facility], [address]  [place], [place] [address]     Procedures/Studies: DG Abd 1 View  Result Date: 02/02/2020 CLINICAL DATA:  Dobbhoff placement EXAM: ABDOMEN - 1 VIEW COMPARISON:  01/20/2020 FINDINGS: Portable radiograph of the lower chest and upper abdomen was obtained for the purposes of enteric tube localization. A Dobbhoff tube is seen coursing below the diaphragm with distal tip terminating within the gastric fundus. Visualized bowel gas pattern is nonobstructive. Cardiomegaly with prosthetic cardiac valve. IMPRESSION: Dobbhoff tube terminates within the gastric fundus. Electronically Signed   By: Duanne Guess D.O.   On: 02/02/2020 11:02   CT HEAD WO CONTRAST  Result Date: 02/13/2020 CLINICAL DATA:  Head trauma, minor. Additional history provided: Patient remains encephalopathic with poor oral intake. Episode of agitation and confusion requiring sedation. EXAM: CT HEAD WITHOUT CONTRAST TECHNIQUE: Contiguous axial images were obtained from the base of the skull through the vertex without intravenous contrast. COMPARISON:  Head CT 08/13/2019. FINDINGS: Brain: Mild cerebral atrophy. 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 A right thalamic lacunar infarct is new as compared to the head CT of 08/13/2019, but chronic in appearance. Mild ill-defined hypoattenuation within the cerebral white matter is nonspecific, but compatible with chronic small vessel ischemic disease. There is no acute intracranial hemorrhage. No acute  demarcated cortical infarct. No extra-axial fluid collection. No evidence of intracranial mass. No midline shift. Vascular: No hyperdense vessel. Skull: Normal. Negative for fracture or focal lesion. Sinuses/Orbits: Visualized orbits show no acute finding. Small left maxillary sinus mucous retention cyst. IMPRESSION: No evidence of acute intracranial abnormality. A right thalamic lacunar infarct is new from the prior head CT of 08/13/2019, but chronic in appearance. Redemonstrated small chronic right parietal lobe cortical infarct. Stable background mild cerebral atrophy and chronic small vessel ischemic disease. Electronically Signed   By: Jackey Loge DO   On: 02/13/2020 19:05   CARDIAC CATHETERIZATION  Result Date: 01/24/2020  Previously placed Prox RCA to Mid RCA stent (unknown type) is widely patent.  Prox RCA lesion is 50% stenosed.  1.  Patent stent in a nondominant right coronary artery.  No significant coronary artery disease overall. 2.  The replaced aortic valve was not crossed. 3.  Right heart catheterization showed mildly elevated filling pressures, minimal pulmonary hypertension and normal cardiac output. Recommendations: The patient has no ischemic substrate for ventricular tachycardia.  He has nonischemic cardiomyopathy. His hemodynamics appear good overall.  Recommend continuing same dose of IV furosemide 20 mg daily which can be switched to an oral diuretic after extubation. I am going to stop lidocaine drip and continue amiodarone drip which can subsequently be switched to oral after extubation. I elected to add small dose carvedilol to assist with his cardiomyopathy and ventricular tachycardia.  DG Chest Port 1 View  Result Date: 02/05/2020 CLINICAL DATA:  Acute respiratory failure EXAM: PORTABLE CHEST 1 VIEW COMPARISON:  01/26/2020 FINDINGS: Cardiac shadow is enlarged. Postsurgical changes are again noted. Right-sided PICC line is seen in satisfactory position. Lungs are well aerated  bilaterally. Patchy airspace opacity is noted in the right base. Vascular congestion is again identified and stable. No bony abnormality is noted. IMPRESSION: Stable vascular congestion. Mild patchy opacity in the right base. Electronically Signed   By: Alcide Clever M.D.   On: 02/05/2020 03:29   DG Chest Port 1 View  Result Date: 01/26/2020 CLINICAL DATA:  Acute respiratory failure with hypoxia. EXAM: PORTABLE CHEST 1 VIEW COMPARISON:  January 23, 2020 FINDINGS: The patient  has been extubated. The right-sided PICC line is well position. The heart remains enlarged. The patient is status post prior median sternotomy and valve replacement. There is mild vascular congestion. There are small bilateral pleural effusions. There is no pneumothorax. IMPRESSION: 1. Well-positioned right-sided PICC line. 2. Cardiomegaly with persistent mild vascular congestion. 3. Small bilateral pleural effusions. Electronically Signed   By: Katherine Mantle M.D.   On: 01/26/2020 23:57   DG Chest Port 1 View  Result Date: 01/23/2020 CLINICAL DATA:  Endotracheal tube present. EXAM: PORTABLE CHEST 1 VIEW COMPARISON:  01/19/2020 FINDINGS: Endotracheal tube is 2.2 cm above the carina. Nasogastric tube extends into the upper abdomen and terminates in the gastric fundus region. Heart size is mildly enlarged but unchanged. Right arm PICC terminates in the SVC region. Patient has a prosthetic heart valve and previous median sternotomy. New patchy densities at the right lung base. Negative for pneumothorax. Slightly decreased lung volumes. 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 IMPRESSION: 1. New patchy densities at the right lung base. Findings could represent atelectasis or developing infection. Upper lungs are clear. 2. Endotracheal tube is appropriately positioned. Electronically Signed   By: Richarda Overlie M.D.   On: 01/23/2020 07:48   ECHOCARDIOGRAM COMPLETE  Result Date:  01/22/2020    ECHOCARDIOGRAM REPORT   Patient Name:   Travis Maxwell Date of Exam: 01/21/2020 Medical Rec #:  [id number]      Height:       67.0 in Accession #:    [id number]     Weight:       149.5 lb Date of Birth:  [birth year]      BSA:          1.787 m Patient Age:    69 years       BP:           92/58 mmHg Patient Gender: M              HR:           85 bpm. Exam Location:  [facility] Procedure: 2D Echo, Cardiac Doppler and Color Doppler Indications:     I47.2 Ventricular tachycardia  History:         Patient has prior history of Echocardiogram examinations, most                  recent 08/13/2019. Status post Aortic Valve Replacement; Risk                  Factors:Hypertension and Diabetes. Coronary artery disease.                  Chronic systolic heart failure.  Sonographer:     Sedonia Small Rodgers-Jones Referring Phys:  [id number] CHRISTOPHER END Diagnosing Phys: Yvonne Kendall MD IMPRESSIONS  1. Left ventricular ejection fraction, by estimation, is 20 to 25%. The left ventricle has severely decreased function. The left ventricle demonstrates regional wall motion abnormalities (see scoring diagram/findings for description). The left ventricular internal cavity size was moderately dilated. There is mild left ventricular hypertrophy. Left ventricular diastolic parameters are consistent with Grade I diastolic dysfunction (impaired relaxation). Elevated left atrial pressure. There is severe global hypokinesis with relative sparing of the basal segments.  2. Right ventricular systolic function is moderately reduced. The right ventricular size is mildly enlarged. Tricuspid regurgitation signal is inadequate for assessing PA pressure.  3. Left atrial size was mildly dilated.  4. The mitral valve is grossly normal. Mild  mitral valve regurgitation. No evidence of mitral stenosis.  5. The aortic valve was not well visualized. Aortic valve regurgitation is not visualized. Echo findings are consistent with normal structure and function  of the aortic valve prosthesis.  6. Pulmonic valve regurgitation not well assessed. FINDINGS  Left Ventricle: Left ventricular ejection fraction, by estimation, is 20 to 25%. The left ventricle has severely decreased function. The left ventricle demonstrates regional wall motion abnormalities. The left ventricular internal cavity size was moderately dilated. There is mild left ventricular hypertrophy. Left ventricular diastolic parameters are consistent with Grade I diastolic dysfunction (impaired relaxation). Elevated left atrial pressure.  LV Wall Scoring: There is severe global hypokinesis with relative sparing of the basal segments. Right Ventricle: The right ventricular size is mildly enlarged. No increase in right ventricular wall thickness. Right ventricular systolic function is moderately reduced. Tricuspid regurgitation signal is inadequate for assessing PA pressure. Left Atrium: Left atrial size was mildly dilated. Right Atrium: Right atrial size was normal in size. Pericardium: There is no evidence of pericardial effusion. Mitral Valve: The mitral valve is grossly normal. Mild mitral valve regurgitation. No evidence of mitral valve stenosis. Tricuspid Valve: The tricuspid valve is not well visualized. Tricuspid valve regurgitation is not demonstrated. Aortic Valve: The aortic valve was not well visualized. Aortic valve regurgitation is not visualized. Aortic valve mean gradient measures 12.0 mmHg. Aortic valve peak gradient measures 17.3 mmHg. There is a 25 mm bioprosthetic valve present in the aortic  position. Echo findings are consistent with normal structure and function of the aortic valve prosthesis. Pulmonic Valve: The pulmonic valve was not well visualized. Pulmonic valve regurgitation not well assessed. Aorta: The aortic root is normal in size and structure. Pulmonary Artery: The pulmonary artery is not well seen. Venous: IVC assessment for right atrial pressure unable to be performed due to  mechanical ventilation. IAS/Shunts: The interatrial septum was not well visualized.  LEFT VENTRICLE PLAX 2D LVIDd:         6.36 cm Diastology LVIDs:         5.63 cm LV e' medial:    3.70 cm/s LV PW:         1.12 cm LV E/e' medial:  18.2 LV IVS:        1.09 cm LV e' lateral:   3.59 cm/s                        LV E/e' lateral: 18.7  RIGHT VENTRICLE            IVC RV Basal diam:  4.22 cm    IVC diam: 2.14 cm RV S prime:     7.07 cm/s LEFT ATRIUM             Index       RIGHT ATRIUM           Index LA diam:        5.60 cm 3.13 cm/m  RA Area:     17.10 cm LA Vol (A2C):   77.5 ml 43.37 ml/m RA Volume:   49.80 ml  27.87 ml/m LA Vol (A4C):   57.0 ml 31.90 ml/m LA Biplane Vol: 69.5 ml 38.90 ml/m  AORTIC VALVE AV Vmax:           207.80 cm/s AV Vmean:          168.200 cm/s AV VTI:            0.333 m AV Peak Grad:  17.3 mmHg AV Mean Grad:      12.0 mmHg LVOT Vmax:         69.15 cm/s LVOT Vmean:        52.050 cm/s LVOT VTI:          0.099 m LVOT/AV VTI ratio: 0.30  AORTA Ao Root diam: 3.60 cm MITRAL VALVE MV Area (PHT): 3.91 cm     SHUNTS MV Decel Time: 194 msec     Systemic VTI: 0.10 m MV E velocity: 67.30 cm/s MV A velocity: 109.00 cm/s MV E/A ratio:  0.62 Christopher End MD Electronically signed by Yvonne Kendall MD Signature Date/Time: 01/22/2020/7:17:38 AM    Final     Labs: BNP (last 3 results) Recent Labs    01/06/20 0821 01/13/20 0659 01/26/20 2339  BNP 1,046.8* 1,662.9* 1,886.4*   Basic Metabolic Panel: Recent Labs  Lab 02/15/20 0610 02/18/20 0754 02/19/20 0515  NA 137  --  136  K 4.2  --  4.6  CL 105  --  104  CO2 21*  --  23  GLUCOSE 121*  --  108*  BUN 23  --  27*  CREATININE 0.93  --  0.97  CALCIUM 9.5  --  9.1  MG 1.9  --  2.0  PHOS 3.2 3.3  --    Liver Function Tests: No results for input(s): AST, ALT, ALKPHOS, BILITOT, PROT, ALBUMIN in the last 168 hours. No results for input(s): LIPASE, AMYLASE in the last 168 hours. No results for input(s): AMMONIA in the last 168  hours. CBC: Recent Labs  Lab 02/16/20 0634 02/17/20 0640 02/18/20 0754 02/19/20 0515 02/20/20 0645  WBC 11.6* 12.1* 10.2 9.4 9.8  NEUTROABS 6.5 6.5 5.8 4.8 5.0  HGB 18.5* 17.0 16.6 16.0 16.4  HCT 58.0* 54.6* 52.2* 51.6 53.2*  MCV 70.1* 71.6* 71.3* 71.2* 71.2*  PLT 477* 455* 422* 412* 431*   Cardiac Enzymes: No results for input(s): CKTOTAL, CKMB, CKMBINDEX, TROPONINI in the last 168 hours. BNP: Invalid input(s): POCBNP CBG: Recent Labs  Lab 02/19/20 2042 02/20/20 0006 02/20/20 0354 02/20/20 0814 02/20/20 1235  GLUCAP 228* 84 181* 190* 111*   D-Dimer No results for input(s): DDIMER in the last 72 hours. Hgb A1c No results for input(s): HGBA1C in the last 72 hours. Lipid Profile No results for input(s): CHOL, HDL, LDLCALC, TRIG, CHOLHDL, LDLDIRECT in the last 72 hours. Thyroid function studies No results for input(s): TSH, T4TOTAL, T3FREE, THYROIDAB in the last 72 hours.  Invalid input(s): FREET3 Anemia work up No results for input(s): VITAMINB12, FOLATE, FERRITIN, TIBC, IRON, RETICCTPCT in the last 72 hours. Urinalysis    Component Value Date/Time   COLORURINE STRAW (A) 11/30/2019 0955   APPEARANCEUR CLEAR (A) 11/30/2019 0955   LABSPEC 1.005 11/30/2019 0955   PHURINE 6.0 11/30/2019 0955   GLUCOSEU 50 (A) 11/30/2019 0955   HGBUR NEGATIVE 11/30/2019 0955   BILIRUBINUR NEGATIVE 11/30/2019 0955   KETONESUR NEGATIVE 11/30/2019 0955   PROTEINUR 30 (A) 11/30/2019 0955   NITRITE NEGATIVE 11/30/2019 0955   LEUKOCYTESUR NEGATIVE 11/30/2019 0955   Sepsis Labs Invalid input(s): PROCALCITONIN,  WBC,  LACTICIDVEN Microbiology Recent Results (from the past 240 hour(s))  SARS Coronavirus 2 by RT PCR (hospital order, performed in [facility] hospital lab) Nasopharyngeal Nasopharyngeal Swab     Status: None   Collection Time: 02/18/20 10:03 PM   Specimen: Nasopharyngeal Swab  Result Value Ref Range Status   SARS Coronavirus 2 NEGATIVE NEGATIVE Final    Comment:  (NOTE)  SARS-CoV-2 target nucleic acids are NOT DETECTED.  The SARS-CoV-2 RNA is generally detectable in upper and lower respiratory specimens during the acute phase of infection. The lowest concentration of SARS-CoV-2 viral copies this assay can detect is 250 copies / mL. A negative result does not preclude SARS-CoV-2 infection and should not be used as the sole basis for treatment or other patient management decisions.  A negative result may occur with improper specimen collection / handling, submission of specimen other than nasopharyngeal swab, presence of viral mutation(s) within the areas targeted by this assay, and inadequate number of viral copies (<250 copies / mL). A negative result must be combined with clinical observations, patient history, and epidemiological information.  Fact Sheet for Patients:   BoilerBrush.com.cy  Fact Sheet for Healthcare Providers: https://pope.com/  This test is not yet approved or  cleared by the [place] FDA and has been authorized for detection and/or diagnosis of SARS-CoV-2 by FDA under an Emergency Use Authorization (EUA).  This EUA will remain in effect (meaning this test can be used) for the duration of the COVID-19 declaration under Section 564(b)(1) of the Act, 21 U.S.C. section 360bbb-3(b)(1), unless the authorization is terminated or revoked sooner.  Performed at [facility], [address] [place], [place] [address]      Time coordinating discharge: 35 minutes  SIGNED: Lanae Boast, MD  [facility] 02/20/2020, 2:13 PM  If 7PM-7AM, please contact night-coverage [email or web address]

## 2020-02-20 NOTE — Progress Notes (Signed)
notifed MD patient refuses cardiac monitoring device

## 2020-03-06 ENCOUNTER — Emergency Department
Admission: EM | Admit: 2020-03-06 | Discharge: 2020-03-06 | Disposition: A | Discharge status: Home / Self Care | Payer: No Typology Code available for payment source

## 2020-07-08 ENCOUNTER — Inpatient Hospital Stay
Admission: EM | Admit: 2020-07-08 | Discharge: 2020-07-13 | DRG: 291 | Disposition: A | Discharge status: Other Acute Inpatient Hospital | Payer: Medicare Other | Attending: Internal Medicine | Admitting: Internal Medicine

## 2020-07-08 ENCOUNTER — Inpatient Hospital Stay: Payer: Medicare Other

## 2020-07-08 ENCOUNTER — Emergency Department: Payer: Medicare Other

## 2020-07-08 DIAGNOSIS — R7989 Other specified abnormal findings of blood chemistry: Secondary | ICD-10-CM | POA: Diagnosis present

## 2020-07-08 DIAGNOSIS — I428 Other cardiomyopathies: Secondary | ICD-10-CM | POA: Diagnosis present

## 2020-07-08 DIAGNOSIS — G928 Other toxic encephalopathy: Secondary | ICD-10-CM | POA: Diagnosis present

## 2020-07-08 DIAGNOSIS — E440 Moderate protein-calorie malnutrition: Secondary | ICD-10-CM | POA: Diagnosis present

## 2020-07-08 DIAGNOSIS — Z978 Presence of other specified devices: Secondary | ICD-10-CM | POA: Diagnosis not present

## 2020-07-08 DIAGNOSIS — Z6824 Body mass index (BMI) 24.0-24.9, adult: Secondary | ICD-10-CM | POA: Diagnosis not present

## 2020-07-08 DIAGNOSIS — J9601 Acute respiratory failure with hypoxia: Secondary | ICD-10-CM | POA: Diagnosis present

## 2020-07-08 DIAGNOSIS — Z20822 Contact with and (suspected) exposure to covid-19: Secondary | ICD-10-CM | POA: Diagnosis present

## 2020-07-08 DIAGNOSIS — Z955 Presence of coronary angioplasty implant and graft: Secondary | ICD-10-CM

## 2020-07-08 DIAGNOSIS — J9602 Acute respiratory failure with hypercapnia: Secondary | ICD-10-CM | POA: Diagnosis present

## 2020-07-08 DIAGNOSIS — I4901 Ventricular fibrillation: Secondary | ICD-10-CM | POA: Diagnosis present

## 2020-07-08 DIAGNOSIS — I110 Hypertensive heart disease with heart failure: Principal | ICD-10-CM | POA: Diagnosis present

## 2020-07-08 DIAGNOSIS — I5043 Acute on chronic combined systolic (congestive) and diastolic (congestive) heart failure: Secondary | ICD-10-CM | POA: Diagnosis present

## 2020-07-08 DIAGNOSIS — R570 Cardiogenic shock: Secondary | ICD-10-CM | POA: Diagnosis present

## 2020-07-08 DIAGNOSIS — I959 Hypotension, unspecified: Secondary | ICD-10-CM

## 2020-07-08 DIAGNOSIS — Z0189 Encounter for other specified special examinations: Secondary | ICD-10-CM

## 2020-07-08 DIAGNOSIS — Z953 Presence of xenogenic heart valve: Secondary | ICD-10-CM | POA: Diagnosis not present

## 2020-07-08 DIAGNOSIS — G9340 Encephalopathy, unspecified: Secondary | ICD-10-CM | POA: Diagnosis not present

## 2020-07-08 DIAGNOSIS — I447 Left bundle-branch block, unspecified: Secondary | ICD-10-CM | POA: Diagnosis present

## 2020-07-08 DIAGNOSIS — G40909 Epilepsy, unspecified, not intractable, without status epilepticus: Secondary | ICD-10-CM | POA: Diagnosis present

## 2020-07-08 DIAGNOSIS — I462 Cardiac arrest due to underlying cardiac condition: Secondary | ICD-10-CM | POA: Diagnosis present

## 2020-07-08 DIAGNOSIS — N179 Acute kidney failure, unspecified: Secondary | ICD-10-CM | POA: Diagnosis present

## 2020-07-08 DIAGNOSIS — I469 Cardiac arrest, cause unspecified: Secondary | ICD-10-CM | POA: Diagnosis present

## 2020-07-08 DIAGNOSIS — K219 Gastro-esophageal reflux disease without esophagitis: Secondary | ICD-10-CM | POA: Diagnosis present

## 2020-07-08 DIAGNOSIS — Z7901 Long term (current) use of anticoagulants: Secondary | ICD-10-CM

## 2020-07-08 DIAGNOSIS — R0989 Other specified symptoms and signs involving the circulatory and respiratory systems: Secondary | ICD-10-CM

## 2020-07-08 DIAGNOSIS — I2510 Atherosclerotic heart disease of native coronary artery without angina pectoris: Secondary | ICD-10-CM | POA: Diagnosis present

## 2020-07-08 DIAGNOSIS — M7989 Other specified soft tissue disorders: Secondary | ICD-10-CM

## 2020-07-08 DIAGNOSIS — E11649 Type 2 diabetes mellitus with hypoglycemia without coma: Secondary | ICD-10-CM | POA: Diagnosis not present

## 2020-07-08 DIAGNOSIS — I480 Paroxysmal atrial fibrillation: Secondary | ICD-10-CM | POA: Diagnosis present

## 2020-07-08 DIAGNOSIS — E785 Hyperlipidemia, unspecified: Secondary | ICD-10-CM | POA: Diagnosis present

## 2020-07-08 DIAGNOSIS — Z8616 Personal history of COVID-19: Secondary | ICD-10-CM

## 2020-07-08 DIAGNOSIS — R0602 Shortness of breath: Secondary | ICD-10-CM

## 2020-07-08 DIAGNOSIS — Z515 Encounter for palliative care: Secondary | ICD-10-CM | POA: Diagnosis not present

## 2020-07-08 DIAGNOSIS — F17200 Nicotine dependence, unspecified, uncomplicated: Secondary | ICD-10-CM | POA: Diagnosis present

## 2020-07-08 DIAGNOSIS — G939 Disorder of brain, unspecified: Secondary | ICD-10-CM | POA: Diagnosis not present

## 2020-07-08 DIAGNOSIS — Z452 Encounter for adjustment and management of vascular access device: Secondary | ICD-10-CM

## 2020-07-08 DIAGNOSIS — F39 Unspecified mood [affective] disorder: Secondary | ICD-10-CM | POA: Diagnosis present

## 2020-07-08 DIAGNOSIS — G931 Anoxic brain damage, not elsewhere classified: Secondary | ICD-10-CM | POA: Diagnosis present

## 2020-07-08 DIAGNOSIS — E1165 Type 2 diabetes mellitus with hyperglycemia: Secondary | ICD-10-CM | POA: Diagnosis present

## 2020-07-08 DIAGNOSIS — Z8701 Personal history of pneumonia (recurrent): Secondary | ICD-10-CM

## 2020-07-08 DIAGNOSIS — J9600 Acute respiratory failure, unspecified whether with hypoxia or hypercapnia: Secondary | ICD-10-CM

## 2020-07-08 DIAGNOSIS — I255 Ischemic cardiomyopathy: Secondary | ICD-10-CM | POA: Diagnosis present

## 2020-07-08 LAB — URINALYSIS, COMPLETE (UACMP) WITH MICROSCOPIC
Bilirubin Urine: NEGATIVE
Glucose, UA: NEGATIVE mg/dL
Ketones, ur: NEGATIVE mg/dL
Leukocytes,Ua: NEGATIVE
Nitrite: NEGATIVE
Protein, ur: NEGATIVE mg/dL
Specific Gravity, Urine: 1.006 (ref 1.005–1.030)
Squamous Epithelial / HPF: NONE SEEN (ref 0–5)
pH: 5 (ref 5.0–8.0)

## 2020-07-08 LAB — CBC
HCT: 53.3 % — ABNORMAL HIGH (ref 39.0–52.0)
Hemoglobin: 15.4 g/dL (ref 13.0–17.0)
MCH: 19.9 pg — ABNORMAL LOW (ref 26.0–34.0)
MCHC: 28.9 g/dL — ABNORMAL LOW (ref 30.0–36.0)
MCV: 69 fL — ABNORMAL LOW (ref 80.0–100.0)
Platelets: 611 10*3/uL — ABNORMAL HIGH (ref 150–400)
RBC: 7.73 MIL/uL — ABNORMAL HIGH (ref 4.22–5.81)
RDW: 25.9 % — ABNORMAL HIGH (ref 11.5–15.5)
WBC: 38.5 10*3/uL — ABNORMAL HIGH (ref 4.0–10.5)
nRBC: 0.1 % (ref 0.0–0.2)

## 2020-07-08 LAB — COMPREHENSIVE METABOLIC PANEL
ALT: 79 U/L — ABNORMAL HIGH (ref 0–44)
AST: 97 U/L — ABNORMAL HIGH (ref 15–41)
Albumin: 3.7 g/dL (ref 3.5–5.0)
Alkaline Phosphatase: 95 U/L (ref 38–126)
Anion gap: 15 (ref 5–15)
BUN: 23 mg/dL (ref 8–23)
CO2: 14 mmol/L — ABNORMAL LOW (ref 22–32)
Calcium: 8.7 mg/dL — ABNORMAL LOW (ref 8.9–10.3)
Chloride: 107 mmol/L (ref 98–111)
Creatinine, Ser: 1.62 mg/dL — ABNORMAL HIGH (ref 0.61–1.24)
GFR, Estimated: 28 mL/min — ABNORMAL LOW (ref >60)
Glucose, Bld: 292 mg/dL — ABNORMAL HIGH (ref 70–99)
Potassium: 3.9 mmol/L (ref 3.5–5.1)
Sodium: 136 mmol/L (ref 135–145)
Total Bilirubin: 0.7 mg/dL (ref 0.3–1.2)
Total Protein: 7.5 g/dL (ref 6.5–8.1)

## 2020-07-08 LAB — TROPONIN I (HIGH SENSITIVITY)
Troponin I (High Sensitivity): 135 ng/L (ref <18)
Troponin I (High Sensitivity): 1247 ng/L (ref <18)
Troponin I (High Sensitivity): 1863 ng/L (ref <18)

## 2020-07-08 LAB — URINE DRUG SCREEN, QUALITATIVE (ARMC ONLY)
Amphetamines, Ur Screen: NOT DETECTED
Barbiturates, Ur Screen: NOT DETECTED
Benzodiazepine, Ur Scrn: NOT DETECTED
Cannabinoid 50 Ng, Ur ~~LOC~~: POSITIVE — AB
Cocaine Metabolite,Ur ~~LOC~~: NOT DETECTED
MDMA (Ecstasy)Ur Screen: NOT DETECTED
Methadone Scn, Ur: NOT DETECTED
Opiate, Ur Screen: NOT DETECTED
Phencyclidine (PCP) Ur S: NOT DETECTED
Tricyclic, Ur Screen: NOT DETECTED

## 2020-07-08 LAB — BLOOD GAS, ARTERIAL
Acid-base deficit: 11.3 mmol/L — ABNORMAL HIGH (ref 0.0–2.0)
Bicarbonate: 14.9 mmol/L — ABNORMAL LOW (ref 20.0–28.0)
FIO2: 40
MECHVT: 500 mL
Mechanical Rate: 16
O2 Saturation: 85.8 %
PEEP: 5 cmH2O
Patient temperature: 37
pCO2 arterial: 34 mmHg (ref 32.0–48.0)
pH, Arterial: 7.25 — ABNORMAL LOW (ref 7.350–7.450)
pO2, Arterial: 60 mmHg — ABNORMAL LOW (ref 83.0–108.0)

## 2020-07-08 LAB — URINALYSIS, ROUTINE W REFLEX MICROSCOPIC
Bilirubin Urine: NEGATIVE
Glucose, UA: NEGATIVE mg/dL
Hgb urine dipstick: NEGATIVE
Ketones, ur: NEGATIVE mg/dL
Leukocytes,Ua: NEGATIVE
Nitrite: NEGATIVE
Protein, ur: NEGATIVE mg/dL
Specific Gravity, Urine: 1.006 (ref 1.005–1.030)
pH: 5 (ref 5.0–8.0)

## 2020-07-08 LAB — ETHANOL: Alcohol, Ethyl (B): <10 mg/dL (ref <10)

## 2020-07-08 LAB — PROTIME-INR
INR: 1.6 — ABNORMAL HIGH (ref 0.8–1.2)
Prothrombin Time: 18.7 seconds — ABNORMAL HIGH (ref 11.4–15.2)

## 2020-07-08 LAB — BASIC METABOLIC PANEL
Anion gap: 13 (ref 5–15)
BUN: 29 mg/dL — ABNORMAL HIGH (ref 8–23)
CO2: 15 mmol/L — ABNORMAL LOW (ref 22–32)
Calcium: 8.3 mg/dL — ABNORMAL LOW (ref 8.9–10.3)
Chloride: 107 mmol/L (ref 98–111)
Creatinine, Ser: 1.68 mg/dL — ABNORMAL HIGH (ref 0.61–1.24)
GFR, Estimated: 44 mL/min — ABNORMAL LOW (ref >60)
Glucose, Bld: 311 mg/dL — ABNORMAL HIGH (ref 70–99)
Potassium: 4.1 mmol/L (ref 3.5–5.1)
Sodium: 135 mmol/L (ref 135–145)

## 2020-07-08 LAB — MAGNESIUM: Magnesium: 1.8 mg/dL (ref 1.7–2.4)

## 2020-07-08 LAB — PROCALCITONIN: Procalcitonin: 0.1 ng/mL

## 2020-07-08 LAB — GLUCOSE, CAPILLARY
Glucose-Capillary: 297 mg/dL — ABNORMAL HIGH (ref 70–99)
Glucose-Capillary: 280 mg/dL — ABNORMAL HIGH (ref 70–99)
Glucose-Capillary: 295 mg/dL — ABNORMAL HIGH (ref 70–99)
Glucose-Capillary: 295 mg/dL — ABNORMAL HIGH (ref 70–99)

## 2020-07-08 LAB — LACTIC ACID, PLASMA
Lactic Acid, Venous: 5.8 mmol/L (ref 0.5–1.9)
Lactic Acid, Venous: 2.4 mmol/L (ref 0.5–1.9)

## 2020-07-08 LAB — APTT: aPTT: 40 seconds — ABNORMAL HIGH (ref 24–36)

## 2020-07-08 LAB — ACETAMINOPHEN LEVEL: Acetaminophen (Tylenol), Serum: <10 ug/mL — ABNORMAL LOW (ref 10–30)

## 2020-07-08 LAB — RESP PANEL BY RT-PCR (FLU A&B, COVID) ARPGX2
Influenza A by PCR: NEGATIVE
Influenza B by PCR: NEGATIVE
SARS Coronavirus 2 by RT PCR: NEGATIVE

## 2020-07-08 LAB — CBG MONITORING, ED: Glucose-Capillary: 316 mg/dL — ABNORMAL HIGH (ref 70–99)

## 2020-07-08 LAB — MRSA PCR SCREENING: MRSA by PCR: NEGATIVE

## 2020-07-08 MED ORDER — ROCURONIUM BROMIDE 50 MG/5ML IV SOLN
INTRAVENOUS | Status: DC | PRN
  Administered 2020-07-08: 100 mg via INTRAVENOUS

## 2020-07-08 MED ORDER — NOREPINEPHRINE 4 MG/250ML-% IV SOLN
2.0000 ug/min | INTRAVENOUS | Status: DC
  Administered 2020-07-08: 2 ug/min via INTRAVENOUS
  Administered 2020-07-09: 1 ug/min via INTRAVENOUS
  Administered 2020-07-09: 2 ug/min via INTRAVENOUS
  Administered 2020-07-10: 6 ug/min via INTRAVENOUS
  Filled 2020-07-08 (×2): qty 250

## 2020-07-08 MED ORDER — VANCOMYCIN HCL 1750 MG/350ML IV SOLN
1750.0000 mg | Freq: Once | INTRAVENOUS | Status: AC
  Administered 2020-07-08: 1750 mg via INTRAVENOUS
  Filled 2020-07-08: qty 350

## 2020-07-08 MED ORDER — INSULIN ASPART 100 UNIT/ML IJ SOLN
0.0000 [IU] | INTRAMUSCULAR | Status: DC
  Administered 2020-07-08: 8 [IU] via SUBCUTANEOUS
  Filled 2020-07-08: qty 1

## 2020-07-08 MED ORDER — INSULIN ASPART 100 UNIT/ML IJ SOLN
0.0000 [IU] | INTRAMUSCULAR | Status: DC
  Administered 2020-07-09: 4 [IU] via SUBCUTANEOUS
  Administered 2020-07-09: 15 [IU] via SUBCUTANEOUS
  Administered 2020-07-10 (×2): 4 [IU] via SUBCUTANEOUS
  Administered 2020-07-10: 3 [IU] via SUBCUTANEOUS
  Administered 2020-07-10 – 2020-07-13 (×13): 4 [IU] via SUBCUTANEOUS
  Administered 2020-07-13: 3 [IU] via SUBCUTANEOUS
  Administered 2020-07-13: 7 [IU] via SUBCUTANEOUS
  Administered 2020-07-13: 4 [IU] via SUBCUTANEOUS
  Filled 2020-07-08 (×21): qty 1

## 2020-07-08 MED ORDER — INSULIN GLARGINE 100 UNIT/ML ~~LOC~~ SOLN
10.0000 [IU] | Freq: Every day | SUBCUTANEOUS | Status: DC
  Administered 2020-07-08: 10 [IU] via SUBCUTANEOUS
  Filled 2020-07-08 (×2): qty 0.1

## 2020-07-08 MED ORDER — FENTANYL 2500MCG IN NS 250ML (10MCG/ML) PREMIX INFUSION
100.0000 ug/h | INTRAVENOUS | Status: DC
  Administered 2020-07-09: 200 ug/h via INTRAVENOUS
  Administered 2020-07-09 – 2020-07-13 (×2): 100 ug/h via INTRAVENOUS
  Filled 2020-07-08 (×3): qty 250

## 2020-07-08 MED ORDER — SODIUM CHLORIDE 0.9 % IV SOLN
INTRAVENOUS | Status: DC | PRN
  Administered 2020-07-08: 500 mL via INTRAVENOUS

## 2020-07-08 MED ORDER — SODIUM CHLORIDE 0.9 % IV SOLN
2.0000 g | Freq: Once | INTRAVENOUS | Status: AC
  Administered 2020-07-08: 2 g via INTRAVENOUS
  Filled 2020-07-08: qty 2

## 2020-07-08 MED ORDER — MIDAZOLAM 50MG/50ML (1MG/ML) PREMIX INFUSION
0.5000 mg/h | INTRAVENOUS | Status: DC
  Administered 2020-07-08: 0.5 mg/h via INTRAVENOUS
  Administered 2020-07-09: 2 mg/h via INTRAVENOUS
  Administered 2020-07-09: 5 mg/h via INTRAVENOUS
  Administered 2020-07-10: 3 mg/h via INTRAVENOUS
  Administered 2020-07-10 – 2020-07-12 (×4): 5 mg/h via INTRAVENOUS
  Administered 2020-07-12: 3 mg/h via INTRAVENOUS
  Administered 2020-07-13: 4 mg/h via INTRAVENOUS
  Filled 2020-07-08 (×10): qty 50

## 2020-07-08 MED ORDER — FENTANYL 2500MCG IN NS 250ML (10MCG/ML) PREMIX INFUSION
50.0000 ug/h | INTRAVENOUS | Status: DC
Start: 2020-07-08 — End: 2020-07-08
  Administered 2020-07-08: 50 ug/h via INTRAVENOUS
  Filled 2020-07-08: qty 250

## 2020-07-08 MED ORDER — CHLORHEXIDINE GLUCONATE CLOTH 2 % EX PADS
6.0000 | MEDICATED_PAD | Freq: Every day | CUTANEOUS | Status: DC
  Administered 2020-07-09 – 2020-07-13 (×5): 6 via TOPICAL

## 2020-07-08 MED ORDER — AMIODARONE HCL IN DEXTROSE 360-4.14 MG/200ML-% IV SOLN
60.0000 mg/h | INTRAVENOUS | Status: DC
  Administered 2020-07-08 (×2): 60 mg/h via INTRAVENOUS

## 2020-07-08 MED ORDER — SODIUM CHLORIDE 0.9 % IV SOLN
250.0000 mL | INTRAVENOUS | Status: DC
  Administered 2020-07-08 – 2020-07-11 (×2): 250 mL via INTRAVENOUS

## 2020-07-08 MED ORDER — PROPOFOL 1000 MG/100ML IV EMUL
25.0000 ug/kg/min | INTRAVENOUS | Status: DC
  Administered 2020-07-08: 25 ug/kg/min via INTRAVENOUS
  Filled 2020-07-08: qty 100

## 2020-07-08 MED ORDER — VANCOMYCIN HCL IN DEXTROSE 1-5 GM/200ML-% IV SOLN: 1000.0000 mg | Freq: Once | INTRAVENOUS | Status: DC

## 2020-07-08 MED ORDER — HEPARIN BOLUS VIA INFUSION
4000.0000 [IU] | Freq: Once | INTRAVENOUS | Status: AC
  Administered 2020-07-08: 4000 [IU] via INTRAVENOUS
  Filled 2020-07-08: qty 4000

## 2020-07-08 MED ORDER — ORAL CARE MOUTH RINSE
15.0000 mL | OROMUCOSAL | Status: DC
  Administered 2020-07-08 – 2020-07-13 (×46): 15 mL via OROMUCOSAL

## 2020-07-08 MED ORDER — AMIODARONE HCL IN DEXTROSE 360-4.14 MG/200ML-% IV SOLN
30.0000 mg/h | INTRAVENOUS | Status: DC
  Administered 2020-07-09: 30 mg/h via INTRAVENOUS
  Filled 2020-07-08: qty 200

## 2020-07-08 MED ORDER — CISATRACURIUM BOLUS VIA INFUSION
0.0500 mg/kg | INTRAVENOUS | Status: DC | PRN
  Filled 2020-07-08: qty 4

## 2020-07-08 MED ORDER — CHLORHEXIDINE GLUCONATE 0.12% ORAL RINSE (MEDLINE KIT)
15.0000 mL | Freq: Two times a day (BID) | OROMUCOSAL | Status: DC
  Administered 2020-07-08 – 2020-07-13 (×10): 15 mL via OROMUCOSAL

## 2020-07-08 MED ORDER — SODIUM CHLORIDE 0.9 % IV SOLN: INTRAVENOUS | Status: DC

## 2020-07-08 MED ORDER — SODIUM CHLORIDE 0.9 % IV BOLUS
500.0000 mL | Freq: Once | INTRAVENOUS | Status: AC
  Administered 2020-07-08: 500 mL via INTRAVENOUS

## 2020-07-08 MED ORDER — HEPARIN (PORCINE) 25000 UT/250ML-% IV SOLN
950.0000 [IU]/h | INTRAVENOUS | Status: DC
  Administered 2020-07-08: 950 [IU]/h via INTRAVENOUS
  Filled 2020-07-08: qty 250

## 2020-07-08 MED ORDER — ETOMIDATE 2 MG/ML IV SOLN
INTRAVENOUS | Status: DC | PRN
  Administered 2020-07-08: 20 mg via INTRAVENOUS

## 2020-07-08 MED ORDER — SODIUM CHLORIDE 0.9 % IV SOLN
1.0000 ug/kg/min | INTRAVENOUS | Status: DC
  Administered 2020-07-08: 1 ug/kg/min via INTRAVENOUS
  Filled 2020-07-08: qty 20

## 2020-07-08 MED ORDER — PANTOPRAZOLE SODIUM 40 MG IV SOLR
40.0000 mg | Freq: Every day | INTRAVENOUS | Status: DC
  Administered 2020-07-08 – 2020-07-12 (×5): 40 mg via INTRAVENOUS
  Filled 2020-07-08 (×5): qty 40

## 2020-07-08 MED ORDER — CISATRACURIUM BOLUS VIA INFUSION
0.1000 mg/kg | Freq: Once | INTRAVENOUS | Status: AC
  Administered 2020-07-08: 7.8 mg via INTRAVENOUS
  Filled 2020-07-08: qty 8

## 2020-07-08 MED ORDER — ASPIRIN 300 MG RE SUPP
300.0000 mg | Freq: Once | RECTAL | Status: AC
  Administered 2020-07-08: 300 mg via RECTAL
  Filled 2020-07-08: qty 1

## 2020-07-08 MED ORDER — ARTIFICIAL TEARS OPHTHALMIC OINT
1.0000 "application " | TOPICAL_OINTMENT | Freq: Three times a day (TID) | OPHTHALMIC | Status: DC
  Administered 2020-07-08 – 2020-07-13 (×12): 1 via OPHTHALMIC
  Filled 2020-07-08: qty 3.5
  Filled 2020-07-08: qty 1

## 2020-07-08 MED ORDER — AMIODARONE HCL 150 MG/3ML IV SOLN
INTRAVENOUS | Status: DC | PRN
  Administered 2020-07-08: 150 mg via INTRAVENOUS

## 2020-07-08 MED ORDER — METOPROLOL TARTRATE 5 MG/5ML IV SOLN
5.0000 mg | Freq: Once | INTRAVENOUS | Status: AC
  Administered 2020-07-08: 5 mg via INTRAVENOUS
  Filled 2020-07-08: qty 5

## 2020-07-08 MED ORDER — NOREPINEPHRINE 4 MG/250ML-% IV SOLN
0.0000 ug/min | INTRAVENOUS | Status: DC
  Filled 2020-07-08 (×2): qty 250

## 2020-07-08 MED ORDER — FENTANYL BOLUS VIA INFUSION
25.0000 ug | INTRAVENOUS | Status: DC | PRN
  Filled 2020-07-08: qty 25

## 2020-07-08 MED ORDER — MAGNESIUM SULFATE 2 GM/50ML IV SOLN
2.0000 g | Freq: Once | INTRAVENOUS | Status: AC
  Administered 2020-07-08: 2 g via INTRAVENOUS
  Filled 2020-07-08: qty 50

## 2020-07-08 NOTE — ED Notes (Signed)
Lab called critical lactic 5.8.

## 2020-07-08 NOTE — Progress Notes (Incomplete)
I have taken an interval history, reviewed the chart and examined the patient. I agree with the Advanced Practitioner's note, impression, and recommendations as outlined.   Briefly, Travis Maxwell who no available medical history who presented with witnessed V fib arrest s/p multiple shocks. No family present. History and data obtained from chart. Cardiology evaluated patient and requesting ICU admission. No urgent need for cath  Intubated in ED post-arrest  Blood pressure (!) 144/103, pulse (!) 112, temperature 99 F (37.2 C), resp. rate (!) 22, height 5\' 10"  (1.778 m), weight 78.4 kg, SpO2 91 %. On exam, chronically ill appearing male on mechanical ventilation. Unresponsive. Lungs CTAB, no wheezing. Heart RRR, no murmur.  Trop 135 CXR 5/4 central vascular congestion  Vfib arrest Consider hypothermia protocol if no contraindications Order CTA if renal function appropriate Heparin gtt Trend troponin Echo UDS, alcohol PRN EKG Cardiology following  Acute hypoxemic respiratory failure secondary arrest  Full vent support F/u ABG SBT/WUA daily. Extubation precluded by mental status Cultures VAP  Acute encephalopathy secondary to arrest Minimize sedating meds F/u CT head  The patient is critically ill with multiple organ systems failure and requires high complexity decision making for assessment and support, frequent evaluation and titration of therapies, application of advanced monitoring technologies and extensive interpretation of multiple databases.  Independent Critical Care Time: 31 Minutes.   , M.D. [facility] 07/08/2020 7:24 PM   Please see Amion for pager number to reach on-call Pulmonary and Critical Care Team.

## 2020-07-08 NOTE — Consult Note (Signed)
Interventional cardiology consult  Witnessed arrest chest pain prior to presentation A. fib sudden death Patient intubated sedated Status post V. fib arrest Multiple shocks Nondiagnostic EKG wide-complex rhythm Known history of coronary bypass surgery Hypertension Hyperlipidemia  Plan Called to see and evaluate patient with sudden cardiac death ventricular fibrillation arrest status post multiple electrical shocks and subsequent amiodarone Patient intubated sedated EKG nondiagnostic wide-complex Recommend defer any cardiac cath Blood pressure heart rate reasonably stabilized Recommend heparin IV for anticoagulation Try to institute aspirin therapy as well Echocardiogram will be helpful for assessment of ventricular function Neurology evaluation to help evaluate for underlying mental status Correct electrolytes Elevated white count unclear etiology Follow-up EKGs and troponins Consider ice protocol Defer cardiac cath for now we will reevaluate in the next 24 to 48 hours

## 2020-07-08 NOTE — Code Documentation (Signed)
Cardioversion 1757

## 2020-07-08 NOTE — Progress Notes (Signed)
eLink Physician-Brief Progress Note Patient Name: Yanky Vanderburg DOB: [birth year] MRN: [id number]   Date of Service  07/08/2020  HPI/Events of Note  Patient admitted following out of hospital V-Fib cardiac arrest s/p resuscitation, acute respiratory failure , and coma. Cardiology is following. Plan is for hypothermia protocol.  eICU Interventions  New Patient Evaluation.        Thomasene Lot Facundo Allemand 07/08/2020, 8:56 PM

## 2020-07-08 NOTE — ED Triage Notes (Signed)
Pt to ED via EMS. Per EMS, Pt started to have chest pain and went into cardiac arrest

## 2020-07-08 NOTE — H&P (Signed)
NAME:  Travis Maxwell, MRN:  [id number], DOB:  03/07/1873, LOS: 0 ADMISSION DATE:  07/08/2020, CONSULTATION DATE:  07/08/2020 REFERRING MD:  Dr. Quentin Cornwall, CHIEF COMPLAINT:  Cardiac Arrest   History of Present Illness:  Male arrived to the ED s/p out of hospital V-fib Cardiac arrest.  Cardiology evaluated the patient, no emergent plans for cardiac cath at this time per report.  Plan is for TTM @ 33C pending CT head results.  Pertinent  Medical History  Unknown medical history  Significant Hospital Events: Including procedures, antibiotic start and stop dates in addition to other pertinent events   . Admit to ICU for cardiac arrest.  Plan for 33 C TTM pending CT head  Interim History / Subjective:  Presented to ED with cardiac arrest Evaluated by Cardiology, no plan for emergent cardiac cath Meets Criteria for TTM, will initiate pending CT Head   Objective   Blood pressure (!) 144/103, pulse (!) 112, temperature 99 F (37.2 C), resp. rate (!) 22, height 5' 10"  (1.778 m), weight 78.4 kg, SpO2 91 %.    Vent Mode: AC FiO2 (%):  [40 %] 40 % Set Rate:  [16 bmp] 16 bmp Vt Set:  [500 mL] 500 mL PEEP:  [5 cmH20] 5 cmH20  No intake or output data in the 24 hours ending 07/08/20 1916 Filed Weights   07/08/20 1833  Weight: 78.4 kg    Examination: General: Critically ill-appearing male status post cardiac arrest, intubated, unresponsive in no acute distress HENT: Atraumatic, normocephalic, neck supple, no JVD, ET tube in place Lungs: Mechanical breath sounds bilaterally, even, synchronous with the vent Cardiovascular: Tachycardia, regular rhythm, S1-S2, no murmurs rubs, gallops Abdomen: Soft, nontender, nondistended, no guarding rebound tenderness, bowel sounds positive x4 Extremities: Normal bulk and tone, no deformities, no edema Neuro: Unresponsive status post cardiac arrest, pupils PERRLA (sluggish at 2 mL bilaterally) GU: Foley catheter in place Skin: Warm and dry.  No obvious  rashes, lesions, ulcerations  Labs/imaging that I havepersonally reviewed  (right click and "Reselect all SmartList Selections" daily)  Labs 07/08/20: Bicarb 14, glucose 292, BUN 23, creatinine 1.62, anion gap 15, AST 97, ALT 79, high-sensitivity troponin 135, WBC 38.5, hemoglobin 15.4, hematocrit 53.3, platelets 611 Chest x-ray 5/4>>Cardiac shadow is enlarged. Postsurgical changes are seen. Endotracheal tube is noted in satisfactory position. Gastric catheter is noted with the tip in the mid to distal esophagus. This should be advanced several cm deeper into the stomach. Central vascular congestion is noted. No focal infiltrate is seen. No acute bony abnormality is noted.  Resolved Hospital Problem list     Assessment & Plan:    V-fib Cardiac Arrest -Continuous cardiac monitoring -Serial EKGs -Maintain MAP greater than 65 -Cardiology consulted, appreciate input ~no plans for emergent cardiac cath at this time per cardiology -Heparin drip -Trend troponin -Trend lactic acid -Obtain 2D echocardiogram -Urine drug screen is pending  Acute hypoxic respiratory failure in the setting cardiac arrest -Full vent support, implement lung protective strategies -Wean FiO2 and PEEP as tolerated to maintain O2 sats greater than 92% -Follow intermittent chest x-ray and ABG as needed -Implement VAP bundle -As needed bronchodilators -Spontaneous breathing trials when respiratory parameters met and when mental status permits  Unresponsive status post cardiac arrest, concern for possible anoxic brain injury -CT head currently pending -Plan to initiate TTM at 33 C -Fentanyl and propofol drips as needed to maintain RASS of -3 to -4 while on TTM and to prevent shivering -EEG pending -Daily wake-up assessment  once TTM complete -Need neuro consult once TTM complete  Leukocytosis -Monitor fever curve -Trend WBCs and procalcitonin -Pan-culture -Placed on empiric cefepime and vancomycin for now  pending  cultures and sensitivities  Acute kidney injury -Monitor I&O's / urinary output -Follow BMP -Ensure adequate renal perfusion -Avoid nephrotoxic agents as able -Replace electrolytes as indicated -Gentle IV fluids  Mildly Elevated LFTs -Trend LFTs -Check serum Tylenol  Hyperglycemia -CBG's -SSI -Follow ICU Hypo/Hyperglycemia protocol         Best practice (right click and "Reselect all SmartList Selections" daily)  Diet:  NPO Pain/Anxiety/Delirium protocol (if indicated): Yes (RASS goal -1) VAP protocol (if indicated): Yes DVT prophylaxis: Systemic AC GI prophylaxis: PPI Glucose control:  SSI Yes Central venous access:  N/A Arterial line:  N/A Foley:  Yes, and it is still needed Mobility:  bed rest  PT consulted: N/A Last date of multidisciplinary goals of care discussion [N/A] Code Status:  full code Disposition: ICU  Labs   CBC: Recent Labs  Lab 07/08/20 1806  WBC 38.5*  HGB 15.4  HCT 53.3*  MCV 69.0*  PLT 611*    Basic Metabolic Panel: Recent Labs  Lab 07/08/20 1806  NA 136  K 3.9  CL 107  CO2 14*  GLUCOSE 292*  BUN 23  CREATININE 1.62*  CALCIUM 8.7*   GFR: Estimated Creatinine Clearance: -3.1 mL/min (A) (by C-G formula based on SCr of 1.62 mg/dL (H)). Recent Labs  Lab 07/08/20 1806 07/08/20 1809  WBC 38.5*  --   LATICACIDVEN  --  5.8*    Liver Function Tests: Recent Labs  Lab 07/08/20 1806  AST 97*  ALT 79*  ALKPHOS 95  BILITOT 0.7  PROT 7.5  ALBUMIN 3.7   No results for input(s): LIPASE, AMYLASE in the last 168 hours. No results for input(s): AMMONIA in the last 168 hours.  ABG No results found for: PHART, PCO2ART, PO2ART, HCO3, TCO2, ACIDBASEDEF, O2SAT   Coagulation Profile: No results for input(s): INR, PROTIME in the last 168 hours.  Cardiac Enzymes: No results for input(s): CKTOTAL, CKMB, CKMBINDEX, TROPONINI in the last 168 hours.  HbA1C: No results found for: HGBA1C  CBG: Recent Labs  Lab  07/08/20 1805  GLUCAP 316*    Review of Systems:   Unable to assess due to unresponsiveness and critical illness  Past Medical History:  He,  has no past medical history on file.   Surgical History:  No history on file   Social History:      Family History:  His family history is not on file.   Allergies Not on File   Home Medications  Prior to Admission medications   Not on File     Critical care time: 60 minutes    Darel Hong, AGACNP-BC [place] epic messenger for cross cover needs If after hours, please call E-link

## 2020-07-08 NOTE — Code Documentation (Signed)
Cardioversion 1755

## 2020-07-08 NOTE — Consult Note (Signed)
ANTICOAGULATION CONSULT NOTE   Pharmacy Consult for heparin Indication: chest pain/ACS  Not on File  Patient Measurements: Height: 5\' 10"  (177.8 cm) Weight: 78.4 kg (172 lb 13.5 oz) IBW/kg (Calculated) : 73 Heparin Dosing Weight: 78.4 kg  Vital Signs: Temp: 99 F (37.2 C) (05/04 1830) BP: 136/98 (05/04 1820) Pulse Rate: 98 (05/04 1830)  Labs: Recent Labs    07/08/20 1806  HGB 15.4  HCT 53.3*  PLT 611*    CrCl cannot be calculated (No successful lab value found.).   Medical History: No past medical history on file.  Medications:  Unknown PTA medications  Assessment: 09/07/20 Travis Maxwell is a 70 y.o. male who presents to the ER with CPR in progress.  Patient reportedly was having chest pain witnessed arrest. Patient arrives with spontaneous respirations and ROSC. Pharmacy has been consulted for heparin dosing for ACS.  Hgb: 15.4 and plts: 611 Heparin Dosing Weight: 78.4 kg   Goal of Therapy:  Heparin level 0.3-0.7 units/ml Monitor platelets by anticoagulation protocol: Yes   Plan:  Give 4000. units bolus x 1 Start heparin infusion at 950 units/hr Check anti-Xa level in 6 hours and daily while on heparin Continue to monitor H&H and platelets  141, PharmD 07/08/2020,6:43 PM

## 2020-07-08 NOTE — Code Documentation (Signed)
Cardioversion 1759

## 2020-07-08 NOTE — Progress Notes (Addendum)
PCCM Update: Patient's girl friend and granddaughter are at the bedside. Provided updates about the patient's condition.  He is Travis Maxwell, a 70 year old male with history of diabetes mellitus, coronary artery disease status post stent placement in 2020 and congestive heart failure. They report he was most sleepy than normal this morning and had been complaining of feeling hot along with chest discomfort. He was found by his brother and noted to be unresponsive when EMS was called.  His family has confirmed that he would want to be full code.   Repeat blood gas shows pH 7.14, pCO2 51 and pO2 74 on FiO2 0.4, PEEP 5 and RR 16.  Vent was then changed to FiO2 0.6, PEEP 8 and RR 22.   Melody Comas, MD [facility] Office: [phone]   See Amion for personal pager Please call Elink 7p-7a. [phone]

## 2020-07-08 NOTE — Consult Note (Signed)
PHARMACY -  BRIEF ANTIBIOTIC NOTE   Pharmacy has received consult(s) for cefepime and vancomycin from an ED provider.  The patient's profile has been reviewed for ht/wt/allergies/indication/available labs.    One time order(s) placed for   Cefepime 2 gram  Vancomycin 1750 mg   Further antibiotics/pharmacy consults should be ordered by admitting physician if indicated.                       Thank you,  Sharen Hones, PharmD, BCPS 07/08/2020  6:43 PM

## 2020-07-08 NOTE — ED Provider Notes (Signed)
[facility] Emergency Department Provider Note    None    (approximate)  I have reviewed the triage vital signs and the nursing notes.   HISTORY  Chief Complaint Cardiac Arrest    HPI Travis Maxwell is a 70 y.o. male who presents to the ER with CPR in progress.  Patient reportedly was having chest pain witnessed arrest.  Fire department with early bystander CPR.  Was initially in V. fib shocked reportedly twice was given amiodarone.  CPR was continued for several minutes requiring 3 doses of epinephrine.  They were able to regain return of spontaneous circulation.  Had 2 runs of V. tach.  Was given additional bolus of amiodarone.  Arrives with spontaneous respirations and ROSC.     No past medical history on file. No family history on file.  Patient Active Problem List   Diagnosis Date Noted  . Cardiac arrest (HCC) 07/08/2020      Prior to Admission medications   Not on File    Allergies Patient has no allergy information on record.    Social History    Review of Systems Patient denies headaches, rhinorrhea, blurry vision, numbness, shortness of breath, chest pain, edema, cough, abdominal pain, nausea, vomiting, diarrhea, dysuria, fevers, rashes or hallucinations unless otherwise stated above in HPI. ____________________________________________   PHYSICAL EXAM:  VITAL SIGNS: Vitals:   07/08/20 2145 07/08/20 2200  BP:  110/79  Pulse: 67 (!) 59  Resp: (!) 22 (!) 22  Temp: 97.7 F (36.5 C) (!) 96.3 F (35.7 C)  SpO2: 93% 97%    Constitutional: agonal resp, advanced airway inplace with assisted breaths Eyes: putipls 79mm reactive Head: Atraumatic. Nose: No congestion/rhinnorhea. Mouth/Throat: Mucous membranes are moist.   Neck: No stridor. Painless ROM.  Cardiovascular: tachycardic rate, regular rhythm. Grossly normal heart sounds.  Good peripheral circulation. Respiratory: spontaneous resp Gastrointestinal: Soft and nontender.  No distention. No abdominal bruits. No CVA tenderness. Genitourinary: deferred Musculoskeletal: No lower extremity tenderness nor edema.  No joint effusions. Neurologic:  GCS 3t Skin:  Skin is warm, dry and intact. No rash noted. Psychiatric: unable to assess.  ____________________________________________   LABS (all labs ordered are listed, but only abnormal results are displayed)  Results for orders placed or performed during the hospital encounter of 07/08/20 (from the past 24 hour(s))  CBG monitoring, ED     Status: Abnormal   Collection Time: 07/08/20  6:05 PM  Result Value Ref Range   Glucose-Capillary 316 (H) 70 - 99 mg/dL  CBC     Status: Abnormal   Collection Time: 07/08/20  6:06 PM  Result Value Ref Range   WBC 38.5 (H) 4.0 - 10.5 K/uL   RBC 7.73 (H) 4.22 - 5.81 MIL/uL   Hemoglobin 15.4 13.0 - 17.0 g/dL   HCT 18.8 (H) 41.6 - 60.6 %   MCV 69.0 (L) 80.0 - 100.0 fL   MCH 19.9 (L) 26.0 - 34.0 pg   MCHC 28.9 (L) 30.0 - 36.0 g/dL   RDW 30.1 (H) 60.1 - 09.3 %   Platelets 611 (H) 150 - 400 K/uL   nRBC 0.1 0.0 - 0.2 %  Comprehensive metabolic panel     Status: Abnormal   Collection Time: 07/08/20  6:06 PM  Result Value Ref Range   Sodium 136 135 - 145 mmol/L   Potassium 3.9 3.5 - 5.1 mmol/L   Chloride 107 98 - 111 mmol/L   CO2 14 (L) 22 - 32 mmol/L   Glucose, Bld  292 (H) 70 - 99 mg/dL   BUN 23 8 - 23 mg/dL   Creatinine, Ser 9.40 (H) 0.61 - 1.24 mg/dL   Calcium 8.7 (L) 8.9 - 10.3 mg/dL   Total Protein 7.5 6.5 - 8.1 g/dL   Albumin 3.7 3.5 - 5.0 g/dL   AST 97 (H) 15 - 41 U/L   ALT 79 (H) 0 - 44 U/L   Alkaline Phosphatase 95 38 - 126 U/L   Total Bilirubin 0.7 0.3 - 1.2 mg/dL   GFR, Estimated 28 (L) >60 mL/min   Anion gap 15 5 - 15  Troponin I (High Sensitivity)     Status: Abnormal   Collection Time: 07/08/20  6:06 PM  Result Value Ref Range   Troponin I (High Sensitivity) 135 (HH) <18 ng/L  Ethanol     Status: None   Collection Time: 07/08/20  6:06 PM  Result  Value Ref Range   Alcohol, Ethyl (B) <10 <10 mg/dL  Acetaminophen level     Status: Abnormal   Collection Time: 07/08/20  6:06 PM  Result Value Ref Range   Acetaminophen (Tylenol), Serum <10 (L) 10 - 30 ug/mL  Lactic acid, plasma     Status: Abnormal   Collection Time: 07/08/20  6:09 PM  Result Value Ref Range   Lactic Acid, Venous 5.8 (HH) 0.5 - 1.9 mmol/L  Urinalysis, Routine w reflex microscopic     Status: Abnormal   Collection Time: 07/08/20  6:15 PM  Result Value Ref Range   Color, Urine STRAW (A) YELLOW   APPearance CLEAR (A) CLEAR   Specific Gravity, Urine 1.006 1.005 - 1.030   pH 5.0 5.0 - 8.0   Glucose, UA NEGATIVE NEGATIVE mg/dL   Hgb urine dipstick NEGATIVE NEGATIVE   Bilirubin Urine NEGATIVE NEGATIVE   Ketones, ur NEGATIVE NEGATIVE mg/dL   Protein, ur NEGATIVE NEGATIVE mg/dL   Nitrite NEGATIVE NEGATIVE   Leukocytes,Ua NEGATIVE NEGATIVE  Blood gas, arterial     Status: Abnormal   Collection Time: 07/08/20  6:23 PM  Result Value Ref Range   FIO2 40.00    VT 500 mL   Peep/cpap 5.0 cm H20   pH, Arterial 7.25 (L) 7.350 - 7.450   pCO2 arterial 34 32.0 - 48.0 mmHg   pO2, Arterial 60 (L) 83.0 - 108.0 mmHg   Bicarbonate 14.9 (L) 20.0 - 28.0 mmol/L   Acid-base deficit 11.3 (H) 0.0 - 2.0 mmol/L   O2 Saturation 85.8 %   Patient temperature 37.0    Collection site LEFT RADIAL    Sample type ARTERIAL DRAW    Allens test (pass/fail) PASS PASS   Mechanical Rate 16   Magnesium     Status: None   Collection Time: 07/08/20  6:33 PM  Result Value Ref Range   Magnesium 1.8 1.7 - 2.4 mg/dL  Resp Panel by RT-PCR (Flu A&B, Covid) Nasopharyngeal Swab     Status: None   Collection Time: 07/08/20  6:46 PM   Specimen: Nasopharyngeal Swab; Nasopharyngeal(NP) swabs in vial transport medium  Result Value Ref Range   SARS Coronavirus 2 by RT PCR NEGATIVE NEGATIVE   Influenza A by PCR NEGATIVE NEGATIVE   Influenza B by PCR NEGATIVE NEGATIVE  Procalcitonin - Baseline     Status: None    Collection Time: 07/08/20  6:46 PM  Result Value Ref Range   Procalcitonin 0.10 ng/mL  Protime-INR     Status: Abnormal   Collection Time: 07/08/20  6:46 PM  Result Value Ref  Range   Prothrombin Time 18.7 (H) 11.4 - 15.2 seconds   INR 1.6 (H) 0.8 - 1.2  APTT     Status: Abnormal   Collection Time: 07/08/20  6:46 PM  Result Value Ref Range   aPTT 40 (H) 24 - 36 seconds  Urine Drug Screen, Qualitative ([facility] only)     Status: Abnormal   Collection Time: 07/08/20  6:51 PM  Result Value Ref Range   Tricyclic, Ur Screen NONE DETECTED NONE DETECTED   Amphetamines, Ur Screen NONE DETECTED NONE DETECTED   MDMA (Ecstasy)Ur Screen NONE DETECTED NONE DETECTED   Cocaine Metabolite,Ur [place] NONE DETECTED NONE DETECTED   Opiate, Ur Screen NONE DETECTED NONE DETECTED   Phencyclidine (PCP) Ur S NONE DETECTED NONE DETECTED   Cannabinoid 50 Ng, Ur [place] POSITIVE (A) NONE DETECTED   Barbiturates, Ur Screen NONE DETECTED NONE DETECTED   Benzodiazepine, Ur Scrn NONE DETECTED NONE DETECTED   Methadone Scn, Ur NONE DETECTED NONE DETECTED  Urinalysis, Complete w Microscopic     Status: Abnormal   Collection Time: 07/08/20  6:51 PM  Result Value Ref Range   Color, Urine STRAW (A) YELLOW   APPearance CLEAR (A) CLEAR   Specific Gravity, Urine 1.006 1.005 - 1.030   pH 5.0 5.0 - 8.0   Glucose, UA NEGATIVE NEGATIVE mg/dL   Hgb urine dipstick SMALL (A) NEGATIVE   Bilirubin Urine NEGATIVE NEGATIVE   Ketones, ur NEGATIVE NEGATIVE mg/dL   Protein, ur NEGATIVE NEGATIVE mg/dL   Nitrite NEGATIVE NEGATIVE   Leukocytes,Ua NEGATIVE NEGATIVE   RBC / HPF 0-5 0 - 5 RBC/hpf   WBC, UA 0-5 0 - 5 WBC/hpf   Bacteria, UA RARE (A) NONE SEEN   Squamous Epithelial / LPF NONE SEEN 0 - 5   Mucus PRESENT    Sperm, UA PRESENT   Arterial Blood Gas     Status: Abnormal (Preliminary result)   Collection Time: 07/08/20  6:54 PM  Result Value Ref Range   FIO2 0.40    Delivery systems VENTILATOR    Mode PRESSURE REGULATED VOLUME  CONTROL    VT 500 mL   LHR 16 resp/min   Peep/cpap 5.0 cm H20   Inspiratory PAP PENDING    Expiratory PAP PENDING    pH, Arterial 7.14 (LL) 7.350 - 7.450   pCO2 arterial 51 (H) 32.0 - 48.0 mmHg   pO2, Arterial 74 (L) 83.0 - 108.0 mmHg   Bicarbonate 17.4 (L) 20.0 - 28.0 mmol/L   Acid-base deficit 12.0 (H) 0.0 - 2.0 mmol/L   O2 Saturation 89.0 %   Patient temperature 37.0    Oxygen index PENDING    Collection site LEFT BRACHIAL    Sample type ARTERY    Allens test (pass/fail) PENDING PASS  Glucose, capillary     Status: Abnormal   Collection Time: 07/08/20  8:10 PM  Result Value Ref Range   Glucose-Capillary 297 (H) 70 - 99 mg/dL  Troponin I (High Sensitivity)     Status: Abnormal   Collection Time: 07/08/20  8:41 PM  Result Value Ref Range   Troponin I (High Sensitivity) 1,247 (HH) <18 ng/L  Glucose, capillary     Status: Abnormal   Collection Time: 07/08/20  9:27 PM  Result Value Ref Range   Glucose-Capillary 280 (H) 70 - 99 mg/dL   ____________________________________________  EKG My review and personal interpretation at Time: 18:08   Indication: cardiac arrest  Rate: 105  Rhythm: afib Axis: left Other: lbbb, nonspecific st  abn ____________________________________________  RADIOLOGY  I personally reviewed all radiographic images ordered to evaluate for the above acute complaints and reviewed radiology reports and findings.  These findings were personally discussed with the patient.  Please see medical record for radiology report.  ____________________________________________   PROCEDURES  Procedure(s) performed:  .Critical Care Performed by: Willy Eddy, MD Authorized by: Willy Eddy, MD   Critical care provider statement:    Critical care time (minutes):  40   Critical care time was exclusive of:  Separately billable procedures and treating other patients   Critical care was necessary to treat or prevent imminent or life-threatening deterioration  of the following conditions:  Circulatory failure   Critical care was time spent personally by me on the following activities:  Development of treatment plan with patient or surrogate, discussions with consultants, evaluation of patient's response to treatment, examination of patient, obtaining history from patient or surrogate, ordering and performing treatments and interventions, ordering and review of laboratory studies, ordering and review of radiographic studies, pulse oximetry, re-evaluation of patient's condition and review of old charts Procedure Name: Intubation Date/Time: 07/08/2020 6:39 PM Performed by: Willy Eddy, MD Pre-anesthesia Checklist: Patient identified, Patient being monitored, Emergency Drugs available, Timeout performed and Suction available Oxygen Delivery Method: Non-rebreather mask Preoxygenation: Pre-oxygenation with 100% oxygen Induction Type: Rapid sequence Ventilation: Mask ventilation without difficulty Laryngoscope Size: Glidescope and 3 Grade View: Grade I Tube size: 7.5 mm Number of attempts: 1 Airway Equipment and Method: Video-laryngoscopy Placement Confirmation: ETT inserted through vocal cords under direct vision,  CO2 detector and Breath sounds checked- equal and bilateral Secured at: 23 cm Tube secured with: ETT holder    .Cardioversion  Date/Time: 07/08/2020 6:40 PM Performed by: Willy Eddy, MD Authorized by: Willy Eddy, MD   Consent:    Consent obtained:  Emergent situation Pre-procedure details:    Cardioversion basis:  Emergent   Rhythm:  Ventricular tachycardia   Electrode placement:  Anterior-posterior Patient sedated: No Attempt one:    Cardioversion mode:  Synchronous   Waveform:  Biphasic   Shock (Joules):  150   Shock outcome:  Conversion to normal sinus rhythm Post-procedure details:    Patient status:  Awake   Patient tolerance of procedure:  Tolerated well, no immediate complications      Critical  Care performed: yes ____________________________________________   INITIAL IMPRESSION / ASSESSMENT AND PLAN / ED COURSE  Pertinent labs & imaging results that were available during my care of the patient were reviewed by me and considered in my medical decision making (see chart for details).   DDX: dysrhythmia, acas, pe, chf, medication effect, overdose  Travis Maxwell is a 70 y.o. who presents to the ED with cardiac arrest.  After transition over to ER bed patient went back into V. tach requiring cardioversion x3 for recurrent V. tach was given 2 boluses of amiodarone and started on amiodarone infusion.  He was intubated.   Clinical Course as of 07/08/20 2216  Wed Jul 08, 2020  1825 Cardiomegaly by chest x-ray mild edema.  Hemodynamics improving with amnioinfusion.  Patient evaluated by Dr. Juliann Pares of cardiology at bedside.  Not meeting STEMI criteria at this time.  Will heparinize will admit to ICU. [PR]  1903 She is hemodynamic stabilizing.  Patient evaluated by intensivist.  Plan to admit to ICU.  Will consider cooling pending now contraindications. [PR]  1941 Late note - Discussed case in consultation with cardiology regarding V. fib arrest regarding possible need for urgent PCI.  Patient  seems to be responding well to amnio.  Heart rates in the 90s.  [PR]    Clinical Course User Index [PR] Willy Eddy, MD    The patient was evaluated in Emergency Department today for the symptoms described in the history of present illness. He/she was evaluated in the context of the global COVID-19 pandemic, which necessitated consideration that the patient might be at risk for infection with the SARS-CoV-2 virus that causes COVID-19. Institutional protocols and algorithms that pertain to the evaluation of patients at risk for COVID-19 are in a state of rapid change based on information released by regulatory bodies including the CDC and federal and state organizations. These policies and  algorithms were followed during the patient's care in the ED.  As part of my medical decision making, I reviewed the following data within the electronic MEDICAL RECORD NUMBER Nursing notes reviewed and incorporated, Labs reviewed, notes from prior ED visits and [place] Controlled Substance Database   ____________________________________________   FINAL CLINICAL IMPRESSION(S) / ED DIAGNOSES  Final diagnoses:  Cardiac arrest (HCC)      NEW MEDICATIONS STARTED DURING THIS VISIT:  There are no discharge medications for this patient.    Note:  This document was prepared using Dragon voice recognition software and may include unintentional dictation errors.    Willy Eddy, MD 07/08/20 2216

## 2020-07-08 NOTE — Code Documentation (Signed)
Intubating now

## 2020-07-08 NOTE — Progress Notes (Signed)
   07/08/20 1800  Clinical Encounter Type  Visited With Health care provider;Patient not available  Visit Type Initial;ED  Referral From Nurse  Consult/Referral To Chaplain   This chaplain received an urgent call from the ED requesting support for the patient. Upon arrival, the patient was receiving care from the medical team. No family present at the time. This chaplain maintained pastoral presence, offering silent, energetic prayer outside of the patient's room. No needs at this time. Will continue to follow closely and support as needed.  Clovis Riley, Chaplain

## 2020-07-08 NOTE — ED Notes (Signed)
Critical result: troponin 135

## 2020-07-08 NOTE — Code Documentation (Signed)
Preparing for intubation

## 2020-07-09 ENCOUNTER — Inpatient Hospital Stay: Payer: Medicare Other

## 2020-07-09 ENCOUNTER — Inpatient Hospital Stay (HOSPITAL_COMMUNITY)
Admit: 2020-07-09 | Discharge: 2020-07-09 | Disposition: A | Discharge status: Home / Self Care | Payer: Medicare Other | Attending: Pulmonary Disease | Admitting: Pulmonary Disease

## 2020-07-09 DIAGNOSIS — G40409 Other generalized epilepsy and epileptic syndromes, not intractable, without status epilepticus: Secondary | ICD-10-CM

## 2020-07-09 DIAGNOSIS — G931 Anoxic brain damage, not elsewhere classified: Secondary | ICD-10-CM

## 2020-07-09 DIAGNOSIS — E440 Moderate protein-calorie malnutrition: Secondary | ICD-10-CM | POA: Insufficient documentation

## 2020-07-09 DIAGNOSIS — I469 Cardiac arrest, cause unspecified: Secondary | ICD-10-CM

## 2020-07-09 LAB — ECHOCARDIOGRAM COMPLETE
AR max vel: 0.75 cm2
AV Area VTI: 0.74 cm2
AV Area mean vel: 0.73 cm2
AV Mean grad: 7 mmHg
AV Peak grad: 13.7 mmHg
Ao pk vel: 1.85 m/s
Area-P 1/2: 4.71 cm2
Height: 70 in
MV VTI: 0.79 cm2
S' Lateral: 6.3 cm
Weight: 2765.45 oz

## 2020-07-09 LAB — BASIC METABOLIC PANEL
Anion gap: 10 (ref 5–15)
Anion gap: 11 (ref 5–15)
Anion gap: 9 (ref 5–15)
Anion gap: 9 (ref 5–15)
Anion gap: 9 (ref 5–15)
BUN: 27 mg/dL — ABNORMAL HIGH (ref 8–23)
BUN: 28 mg/dL — ABNORMAL HIGH (ref 8–23)
BUN: 28 mg/dL — ABNORMAL HIGH (ref 8–23)
BUN: 27 mg/dL — ABNORMAL HIGH (ref 8–23)
BUN: 26 mg/dL — ABNORMAL HIGH (ref 8–23)
CO2: 18 mmol/L — ABNORMAL LOW (ref 22–32)
CO2: 17 mmol/L — ABNORMAL LOW (ref 22–32)
CO2: 19 mmol/L — ABNORMAL LOW (ref 22–32)
CO2: 18 mmol/L — ABNORMAL LOW (ref 22–32)
CO2: 17 mmol/L — ABNORMAL LOW (ref 22–32)
Calcium: 7 mg/dL — ABNORMAL LOW (ref 8.9–10.3)
Calcium: 8.9 mg/dL (ref 8.9–10.3)
Calcium: 8.7 mg/dL — ABNORMAL LOW (ref 8.9–10.3)
Calcium: 8.5 mg/dL — ABNORMAL LOW (ref 8.9–10.3)
Calcium: 8.6 mg/dL — ABNORMAL LOW (ref 8.9–10.3)
Chloride: 110 mmol/L (ref 98–111)
Chloride: 109 mmol/L (ref 98–111)
Chloride: 107 mmol/L (ref 98–111)
Chloride: 111 mmol/L (ref 98–111)
Chloride: 112 mmol/L — ABNORMAL HIGH (ref 98–111)
Creatinine, Ser: 1.44 mg/dL — ABNORMAL HIGH (ref 0.61–1.24)
Creatinine, Ser: 1.51 mg/dL — ABNORMAL HIGH (ref 0.61–1.24)
Creatinine, Ser: 1.71 mg/dL — ABNORMAL HIGH (ref 0.61–1.24)
Creatinine, Ser: 1.21 mg/dL (ref 0.61–1.24)
Creatinine, Ser: 0.94 mg/dL (ref 0.61–1.24)
GFR, Estimated: 53 mL/min — ABNORMAL LOW (ref >60)
GFR, Estimated: 50 mL/min — ABNORMAL LOW (ref >60)
GFR, Estimated: 43 mL/min — ABNORMAL LOW (ref >60)
GFR, Estimated: >60 mL/min (ref >60)
GFR, Estimated: >60 mL/min (ref >60)
Glucose, Bld: 264 mg/dL — ABNORMAL HIGH (ref 70–99)
Glucose, Bld: 171 mg/dL — ABNORMAL HIGH (ref 70–99)
Glucose, Bld: 107 mg/dL — ABNORMAL HIGH (ref 70–99)
Glucose, Bld: 74 mg/dL (ref 70–99)
Glucose, Bld: 218 mg/dL — ABNORMAL HIGH (ref 70–99)
Potassium: 3.4 mmol/L — ABNORMAL LOW (ref 3.5–5.1)
Potassium: 4.4 mmol/L (ref 3.5–5.1)
Potassium: 3.9 mmol/L (ref 3.5–5.1)
Potassium: 4.2 mmol/L (ref 3.5–5.1)
Potassium: 3.9 mmol/L (ref 3.5–5.1)
Sodium: 138 mmol/L (ref 135–145)
Sodium: 137 mmol/L (ref 135–145)
Sodium: 135 mmol/L (ref 135–145)
Sodium: 138 mmol/L (ref 135–145)
Sodium: 138 mmol/L (ref 135–145)

## 2020-07-09 LAB — GLUCOSE, CAPILLARY
Glucose-Capillary: 113 mg/dL — ABNORMAL HIGH (ref 70–99)
Glucose-Capillary: 130 mg/dL — ABNORMAL HIGH (ref 70–99)
Glucose-Capillary: 134 mg/dL — ABNORMAL HIGH (ref 70–99)
Glucose-Capillary: 137 mg/dL — ABNORMAL HIGH (ref 70–99)
Glucose-Capillary: 138 mg/dL — ABNORMAL HIGH (ref 70–99)
Glucose-Capillary: 173 mg/dL — ABNORMAL HIGH (ref 70–99)
Glucose-Capillary: 185 mg/dL — ABNORMAL HIGH (ref 70–99)
Glucose-Capillary: 198 mg/dL — ABNORMAL HIGH (ref 70–99)
Glucose-Capillary: 206 mg/dL — ABNORMAL HIGH (ref 70–99)
Glucose-Capillary: 237 mg/dL — ABNORMAL HIGH (ref 70–99)
Glucose-Capillary: 249 mg/dL — ABNORMAL HIGH (ref 70–99)
Glucose-Capillary: 307 mg/dL — ABNORMAL HIGH (ref 70–99)
Glucose-Capillary: 314 mg/dL — ABNORMAL HIGH (ref 70–99)
Glucose-Capillary: 66 mg/dL — ABNORMAL LOW (ref 70–99)
Glucose-Capillary: 74 mg/dL (ref 70–99)
Glucose-Capillary: 75 mg/dL (ref 70–99)
Glucose-Capillary: 82 mg/dL (ref 70–99)
Glucose-Capillary: 82 mg/dL (ref 70–99)
Glucose-Capillary: 84 mg/dL (ref 70–99)
Glucose-Capillary: 85 mg/dL (ref 70–99)
Glucose-Capillary: 95 mg/dL (ref 70–99)

## 2020-07-09 LAB — BLOOD GAS, ARTERIAL
Acid-base deficit: 11.7 mmol/L — ABNORMAL HIGH (ref 0.0–2.0)
Bicarbonate: 13 mmol/L — ABNORMAL LOW (ref 20.0–28.0)
FIO2: 0.35
MECHVT: 500 mL
O2 Saturation: 97.7 %
PEEP: 8 cmH2O
Patient temperature: 37
RATE: 22 resp/min
pCO2 arterial: 27 mmHg — ABNORMAL LOW (ref 32.0–48.0)
pH, Arterial: 7.29 — ABNORMAL LOW (ref 7.350–7.450)
pO2, Arterial: 110 mmHg — ABNORMAL HIGH (ref 83.0–108.0)

## 2020-07-09 LAB — CBC
HCT: 56.5 % — ABNORMAL HIGH (ref 39.0–52.0)
Hemoglobin: 16.3 g/dL (ref 13.0–17.0)
MCH: 20 pg — ABNORMAL LOW (ref 26.0–34.0)
MCHC: 28.8 g/dL — ABNORMAL LOW (ref 30.0–36.0)
MCV: 69.3 fL — ABNORMAL LOW (ref 80.0–100.0)
Platelets: 598 10*3/uL — ABNORMAL HIGH (ref 150–400)
RBC: 8.15 MIL/uL — ABNORMAL HIGH (ref 4.22–5.81)
RDW: 26.4 % — ABNORMAL HIGH (ref 11.5–15.5)
WBC: 31.6 10*3/uL — ABNORMAL HIGH (ref 4.0–10.5)
nRBC: 0 % (ref 0.0–0.2)

## 2020-07-09 LAB — PROTIME-INR
INR: 1.7 — ABNORMAL HIGH (ref 0.8–1.2)
Prothrombin Time: 20 seconds — ABNORMAL HIGH (ref 11.4–15.2)

## 2020-07-09 LAB — TROPONIN I (HIGH SENSITIVITY): Troponin I (High Sensitivity): 1367 ng/L (ref <18)

## 2020-07-09 LAB — HEMOGLOBIN A1C
Hgb A1c MFr Bld: 6.2 % — ABNORMAL HIGH (ref 4.8–5.6)
Mean Plasma Glucose: 131.24 mg/dL

## 2020-07-09 LAB — LACTIC ACID, PLASMA
Lactic Acid, Venous: 3.9 mmol/L (ref 0.5–1.9)
Lactic Acid, Venous: 3.6 mmol/L (ref 0.5–1.9)
Lactic Acid, Venous: 2.7 mmol/L (ref 0.5–1.9)
Lactic Acid, Venous: 2.3 mmol/L (ref 0.5–1.9)

## 2020-07-09 LAB — PROCALCITONIN: Procalcitonin: 2.54 ng/mL

## 2020-07-09 LAB — MAGNESIUM: Magnesium: 2.2 mg/dL (ref 1.7–2.4)

## 2020-07-09 LAB — HEPARIN LEVEL (UNFRACTIONATED): Heparin Unfractionated: >1.1 IU/mL — ABNORMAL HIGH (ref 0.30–0.70)

## 2020-07-09 LAB — APTT
aPTT: >200 seconds (ref 24–36)
aPTT: 63 seconds — ABNORMAL HIGH (ref 24–36)

## 2020-07-09 MED ORDER — DEXTROSE 50 % IV SOLN
INTRAVENOUS | Status: AC
  Administered 2020-07-09: 12.5 g via INTRAVENOUS
  Filled 2020-07-09: qty 50

## 2020-07-09 MED ORDER — SODIUM CHLORIDE 0.9 % IV SOLN
2000.0000 mg | Freq: Once | INTRAVENOUS | Status: AC
  Administered 2020-07-09: 2000 mg via INTRAVENOUS
  Filled 2020-07-09: qty 20

## 2020-07-09 MED ORDER — POTASSIUM CHLORIDE 20 MEQ PO PACK: 60.0000 meq | PACK | Freq: Once | ORAL | Status: DC

## 2020-07-09 MED ORDER — VECURONIUM BROMIDE 10 MG IV SOLR
INTRAVENOUS | Status: AC
  Administered 2020-07-09: 10 mg via INTRAVENOUS
  Filled 2020-07-09: qty 10

## 2020-07-09 MED ORDER — AMIODARONE HCL IN DEXTROSE 360-4.14 MG/200ML-% IV SOLN
30.0000 mg/h | INTRAVENOUS | Status: DC
  Administered 2020-07-10 – 2020-07-13 (×7): 30 mg/h via INTRAVENOUS
  Filled 2020-07-09 (×8): qty 200

## 2020-07-09 MED ORDER — DEXTROSE 50 % IV SOLN: 12.5000 g | Freq: Once | INTRAVENOUS | Status: AC

## 2020-07-09 MED ORDER — LEVETIRACETAM IN NACL 1000 MG/100ML IV SOLN
1000.0000 mg | Freq: Two times a day (BID) | INTRAVENOUS | Status: DC
  Administered 2020-07-10 – 2020-07-13 (×7): 1000 mg via INTRAVENOUS
  Filled 2020-07-09 (×8): qty 100

## 2020-07-09 MED ORDER — SODIUM CHLORIDE 0.9 % IV SOLN
2.0000 g | Freq: Two times a day (BID) | INTRAVENOUS | Status: DC
  Administered 2020-07-09: 2 g via INTRAVENOUS
  Filled 2020-07-09 (×2): qty 2

## 2020-07-09 MED ORDER — ATROPINE SULFATE 1 MG/10ML IJ SOSY
PREFILLED_SYRINGE | INTRAMUSCULAR | Status: AC
  Filled 2020-07-09: qty 10

## 2020-07-09 MED ORDER — VANCOMYCIN HCL 1250 MG/250ML IV SOLN
1250.0000 mg | INTRAVENOUS | Status: DC
  Filled 2020-07-09: qty 250

## 2020-07-09 MED ORDER — AMIODARONE HCL IN DEXTROSE 360-4.14 MG/200ML-% IV SOLN
INTRAVENOUS | Status: AC
  Administered 2020-07-09: 30 mg/h via INTRAVENOUS
  Filled 2020-07-09: qty 200

## 2020-07-09 MED ORDER — VECURONIUM BROMIDE 10 MG IV SOLR
10.0000 mg | INTRAVENOUS | Status: DC | PRN
  Administered 2020-07-09: 10 mg via INTRAVENOUS
  Filled 2020-07-09 (×2): qty 10

## 2020-07-09 MED ORDER — POTASSIUM CHLORIDE 10 MEQ/100ML IV SOLN
10.0000 meq | INTRAVENOUS | Status: AC
  Administered 2020-07-09 (×4): 10 meq via INTRAVENOUS
  Filled 2020-07-09 (×4): qty 100

## 2020-07-09 MED ORDER — CALCIUM GLUCONATE-NACL 1-0.675 GM/50ML-% IV SOLN
1.0000 g | Freq: Once | INTRAVENOUS | Status: AC
  Administered 2020-07-09: 1000 mg via INTRAVENOUS
  Filled 2020-07-09 (×2): qty 50

## 2020-07-09 MED ORDER — DEXTROSE 50 % IV SOLN: 25.0000 g | INTRAVENOUS | Status: DC

## 2020-07-09 MED ORDER — DEXTROSE 10 % IV SOLN: INTRAVENOUS | Status: DC

## 2020-07-09 MED ORDER — VECURONIUM BROMIDE 10 MG IV SOLR: 10.0000 mg | Freq: Once | INTRAVENOUS | Status: AC

## 2020-07-09 MED ORDER — HEPARIN (PORCINE) 25000 UT/250ML-% IV SOLN
700.0000 [IU]/h | INTRAVENOUS | Status: DC
  Administered 2020-07-09 – 2020-07-10 (×2): 700 [IU]/h via INTRAVENOUS
  Filled 2020-07-09: qty 250

## 2020-07-09 MED ORDER — DEXTROSE 50 % IV SOLN: 12.5000 g | Freq: Once | INTRAVENOUS | Status: DC

## 2020-07-09 MED ORDER — PROPOFOL 1000 MG/100ML IV EMUL
INTRAVENOUS | Status: AC
  Administered 2020-07-09: 5 ug/kg/min via INTRAVENOUS
  Filled 2020-07-09: qty 100

## 2020-07-09 MED ORDER — PROPOFOL 1000 MG/100ML IV EMUL
5.0000 ug/kg/min | INTRAVENOUS | Status: DC
  Administered 2020-07-09: 5 ug/kg/min via INTRAVENOUS
  Administered 2020-07-10 – 2020-07-11 (×4): 20 ug/kg/min via INTRAVENOUS
  Administered 2020-07-12: 15 ug/kg/min via INTRAVENOUS
  Administered 2020-07-12: 20 ug/kg/min via INTRAVENOUS
  Administered 2020-07-12: 10 ug/kg/min via INTRAVENOUS
  Filled 2020-07-09 (×9): qty 100

## 2020-07-09 MED FILL — Medication: Qty: 1 | Status: AC

## 2020-07-09 NOTE — Plan of Care (Signed)
  Problem: Clinical Measurements: Goal: Respiratory complications will improve Outcome: Progressing   Problem: Pain Managment: Goal: General experience of comfort will improve Outcome: Progressing   Problem: Safety: Goal: Ability to remain free from injury will improve Outcome: Progressing   Problem: Skin Integrity: Goal: Risk for impaired skin integrity will decrease Outcome: Progressing   Problem: Education: Goal: Knowledge of General Education information will improve Description: Including pain rating scale, medication(s)/side effects and non-pharmacologic comfort measures Outcome: Not Progressing   Problem: Clinical Measurements: Goal: Cardiovascular complication will be avoided Outcome: Not Progressing   Problem: Nutrition: Goal: Adequate nutrition will be maintained Outcome: Not Progressing

## 2020-07-09 NOTE — Progress Notes (Signed)
Versed turned off per neurologist request for EEG.

## 2020-07-09 NOTE — Progress Notes (Signed)
Ultrasound technician at bedside to complete venous ultrasound of bilateral lower extremities.  Per Technician unable to do arterial ultrasound because patient is on vent and being on vent messes with doppler signals causing a false positive.  Dr. Everardo All notified of information.

## 2020-07-09 NOTE — Progress Notes (Signed)
Amiodarone stopped as Pt's HR dipping to 40's. Dr Francine Graven made aware and advise to stop amiodarone infusion.

## 2020-07-09 NOTE — Progress Notes (Signed)
Patient Name: Travis Maxwell Date of Encounter: 07/09/2020  Hospital Problem List     Active Problems:   Cardiac arrest [facility])    Patient Profile      70yo  African-American male with history of hypertension, diabetes, hyperlipidemia, GERD, CAD status post PCI, history of bioprosthetic valve, PAF on Eliquis, history of Covid 11/4501, severe systolic CHF with EF 88% presented to the hospital respite distress, chest pain on 01/06/2020-was in respiratory distress. Initially on BiPAP but breathing worsened and was emergently intubated and admitted to ICU.  Patient was found to be in cardiogenic shock, also with ST elevation EKG changes but did not meet criteria for  STEMI. Patient was managed for acute severe respiratory failure, severe metabolic acidosis with severe systolic CHF exacerbation, toxic metabolic encephalopathy, Pseudomonas pneumonia. He was  on vasopressors weaned off 11/6, extubated to BiPAP 11/6.  Hospital course complicated with arrhythmia seen by cardiology/EP for V. tach/torsades./14 patient further deteriorated needing intubation and patient remained at high risk for cardiac arrest and death./19 status post left and right heart cath no obstructive lesion, however showed normal pressure and was being treated medically.  With 11/20.  Post extubation patient had episodes Betac and was brought to normal sinus rhythm with amiodarone bolus and metoprolol.  She had been on Precedex and was gradually weaned off. 11/24 patient was transferred to [facility] on medical floor. Hospital course complicated by encephalopathy poor oral intake was not NGT feeding subsequently removed, has needed one-to-one sitter but has been off seizures since.  At times he is agitated.  PT OT has been following him and has recommended skilled nursing facility placement.  He is seen by psychiatry remains on olanzapine dose has been cut down due to prolonged QTC.  Patient is not able to give a history.  Was noted to be  unresponsive.  Prehospital received CPR and cardioversion from ventricular fibrillation.  During previous hospitalization the team that was managing him from a cardiac standpoint had recommended discharge on amiodarone.  It does not appear that he was felt to be a candidate for ICD at the time.  Patient was brought back as a John Doe.  He also or VF arrested in the ER and had to be shocked from it.  He was placed on amiodarone and placed in the code ice protocol and admitted to the ICU.  He currently is intubated and under the code ice protocol.  He is not able to give a history.  He was on an amiodarone drip which was held due to relative bradycardia.  Patient has had a long QTC. Subjective   Intubated and unresponsive.  Sinus rhythm.  Inpatient Medications     artificial tears  1 application Both Eyes E2C   chlorhexidine gluconate (MEDLINE KIT)  15 mL Mouth Rinse BID   Chlorhexidine Gluconate Cloth  6 each Topical Daily   insulin aspart  0-20 Units Subcutaneous Q4H   mouth rinse  15 mL Mouth Rinse 10 times per day   pantoprazole (PROTONIX) IV  40 mg Intravenous QHS    Vital Signs    Vitals:   07/09/20 1430 07/09/20 1530 07/09/20 1600 07/09/20 1630  BP: 102/78 116/86 118/77 113/78  Pulse: (!) 52 (!) 50 (!) 48 (!) 51  Resp:      Temp:   (!) 89.2 F (31.8 C)   TempSrc:   Bladder   SpO2: 100% 99% 100% 99%  Weight:      Height:  Intake/Output Summary (Last 24 hours) at 07/09/2020 1719 Last data filed at 07/09/2020 1600 Gross per 24 hour  Intake 2490.25 ml  Output 1625 ml  Net 865.25 ml   Filed Weights   07/08/20 1833  Weight: 78.4 kg    Physical Exam    GEN: Acutely ill male HEENT: normal.  Neck: Supple, no JVD, carotid bruits, or masses. Cardiac: RRR,  Respiratory:  Respirations regular and unlabored, clear to auscultation bilaterally. GI: Soft, nontender, nondistended, BS + x 4. MS: no deformity or atrophy. Skin: warm and dry, no rash. Neuro: Intubated,  unresponsive on code ice protocol  Labs    CBC Recent Labs    07/08/20 1806 07/09/20 0124  WBC 38.5* 31.6*  HGB 15.4 16.3  HCT 53.3* 56.5*  MCV 69.0* 69.3*  PLT 611* 915*   Basic Metabolic Panel Recent Labs    07/08/20 1833 07/08/20 2159 07/09/20 0848 07/09/20 1605  NA  --    < > 135 138  K  --    < > 3.9 4.2  CL  --    < > 107 111  CO2  --    < > 19* 18*  GLUCOSE  --    < > 107* 74  BUN  --    < > 28* 27*  CREATININE  --    < > 1.71* 1.21  CALCIUM  --    < > 8.7* 8.5*  MG 1.8  --   --   --    < > = values in this interval not displayed.   Liver Function Tests Recent Labs    07/08/20 1806  AST 97*  ALT 79*  ALKPHOS 95  BILITOT 0.7  PROT 7.5  ALBUMIN 3.7   No results for input(s): LIPASE, AMYLASE in the last 72 hours. Cardiac Enzymes No results for input(s): CKTOTAL, CKMB, CKMBINDEX, TROPONINI in the last 72 hours. BNP No results for input(s): BNP in the last 72 hours. D-Dimer No results for input(s): DDIMER in the last 72 hours. Hemoglobin A1C Recent Labs    07/08/20 2159  HGBA1C 6.2*   Fasting Lipid Panel No results for input(s): CHOL, HDL, LDLCALC, TRIG, CHOLHDL, LDLDIRECT in the last 72 hours. Thyroid Function Tests No results for input(s): TSH, T4TOTAL, T3FREE, THYROIDAB in the last 72 hours.  Invalid input(s): FREET3  Telemetry    Sinus rhythm currently.  Had episodes of VF in the ER.  ECG    Normal sinus rhythm  Radiology    DG Abd 1 View  Result Date: 07/09/2020 CLINICAL DATA:  NG tube placement. EXAM: ABDOMEN - 1 VIEW COMPARISON:  07/08/2020. FINDINGS: NG tube noted with tip over the stomach. Gastric distention noted. Debris in the stomach may be present. Small-bowel distention also noted. Colonic gas pattern is normal. No free air. No acute bony abnormality. IMPRESSION: NG tube noted with tip over the stomach. Moderate gastric distention. Debris may be present in the stomach. Small-bowel distention is also noted. Follow-up abdominal  series suggested to demonstrate resolution of gastric and small bowel distention. Electronically Signed   By: Marcello Moores  Register   On: 07/09/2020 16:19   DG Abd 1 View  Result Date: 07/08/2020 CLINICAL DATA:  Orogastric tube placement EXAM: ABDOMEN - 1 VIEW COMPARISON:  02/02/2020 FINDINGS: Nasogastric tube is in place with its tip within the expected proximal body of the stomach and its proximal side hole in the expected location of the gastroesophageal junction. There is mild gaseous distension of the stomach.  Thel pelvis is excluded from view. 8 mm stable calcification overlies the right kidney. Mild cardiomegaly is stable. Aortic valve replacement has been performed. IMPRESSION: Nasogastric tube just within the proximal body of the stomach. Advancement by 5-10 cm may better position the catheter within the gastric lumen. Electronically Signed   By: Fidela Salisbury MD   On: 07/08/2020 23:42   CT Head Wo Contrast  Result Date: 07/08/2020 CLINICAL DATA:  Altered mental status EXAM: CT HEAD WITHOUT CONTRAST TECHNIQUE: Contiguous axial images were obtained from the base of the skull through the vertex without intravenous contrast. COMPARISON:  02/13/2020 FINDINGS: Brain: Normal anatomic configuration. Parenchymal volume loss is commensurate with the patient's age. Mild periventricular white matter changes are present likely reflecting the sequela of small vessel ischemia. Right parietal encephalomalacia is unchanged. 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 No abnormal intra or extra-axial mass lesion or fluid collection. No abnormal mass effect or midline shift. No evidence of acute intracranial hemorrhage or infarct. Ventricular size is normal. Cerebellum unremarkable. Vascular: No asymmetric hyperdense vasculature at the skull base. Skull: Intact Sinuses/Orbits: Paranasal sinuses are clear. Orbits are unremarkable. Other: Mastoid air cells and middle ear  cavities are clear. IMPRESSION: No acute intracranial abnormality. Stable senescent changes and remote infarcts. Electronically Signed   By: Fidela Salisbury MD   On: 07/08/2020 20:20   DG Chest Port 1 View  Result Date: 07/09/2020 CLINICAL DATA:  Central venous catheter placement EXAM: PORTABLE CHEST 1 VIEW COMPARISON:  07/08/2020 FINDINGS: Right internal jugular central venous catheter has been placed with its tip overlying the superior vena cava. Endotracheal tube is seen 4.8 cm above the carina. Nasogastric tube extends into the upper abdomen beyond the margin of the examination. Mild perihilar interstitial pulmonary edema, asymmetrically more severe within the left perihilar region, is again seen in keeping with probable trace perihilar pulmonary edema. No pneumothorax or pleural effusion. Mild cardiomegaly is stable. Aortic valve replacement has been performed. 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 IMPRESSION: Right internal jugular central venous catheter tip within the superior vena cava. No pneumothorax. Otherwise stable lines and tubes. Trace bilateral perihilar pulmonary edema, likely cardiogenic in nature. Stable cardiomegaly. Electronically Signed   By: Fidela Salisbury MD   On: 07/09/2020 15:59   DG Chest Portable 1 View  Result Date: 07/08/2020 CLINICAL DATA:  Status post CPR and intubation EXAM: PORTABLE CHEST 1 VIEW COMPARISON:  None. FINDINGS: Cardiac shadow is enlarged. Postsurgical changes are seen. Endotracheal tube is noted in satisfactory position. Gastric catheter is noted with the tip in the mid to distal esophagus. This should be advanced several cm deeper into the stomach. Central vascular congestion is noted. No focal infiltrate is seen. No acute bony abnormality is noted. IMPRESSION: Tubes and lines as described. Gastric catheter should be advanced several cm deeper into the stomach. Central vascular congestion consistent with the recent cardiac arrest.  Electronically Signed   By: Inez Catalina M.D.   On: 07/08/2020 18:43   EEG adult  Result Date: 07/09/2020 Lora Havens, MD     07/09/2020  2:03 PM Patient Name: Jhaden Pizzuto MRN: [id number] Epilepsy Attending: Lora Havens Referring Physician/Provider: Dr. Margaretha Seeds Date: 07/09/2020 Duration: 33.41 mins Patient history: 70 year old male status post V. fib arrest.  EEG to evaluate seizures. Level of alertness: Comatose AEDs during EEG study: Propofol, Versed Technical aspects: This EEG study was done with scalp electrodes positioned according to the 10-20  International system of electrode placement. Electrical activity was acquired at a sampling rate of 500Hz  and reviewed with a high frequency filter of 70Hz  and a low frequency filter of 1Hz . EEG data were recorded continuously and digitally stored. Description: EEG initially showed continuous generalized background attenuation. Patient was also noted to have brief full body jerks every few seconds. Concomitant EEG showed generalized spike consistent with myoclonic seizure.  Propofol was started at around 1245 after which myoclonic seizures stopped.  he was administered at 1305 after which eeg showed continuous generalized background attenuation with generalized periodic discharges at 1 Hz.  EEG was reactive to tactile stimulation.  Hyperventilation and photic stimulation were not performed.   ABNORMALITY -Myoclonic seizures, generalized -Periodic discharges, generalized -Background attenuation, generalized IMPRESSION: This study initially showed myoclonic seizures every few seconds.  Propofol was started at around 1245 after which myoclonic seizures stopped and EEG was suggestive of generalized epileptogenicity  profound diffuse encephalopathy. These EEG findings are suggestive of anoxic/hypoxic brain injury. Dr Curly Shores was notified. Lora Havens   ECHOCARDIOGRAM COMPLETE  Result Date: 07/09/2020    ECHOCARDIOGRAM REPORT   Patient Name:    CALLAHAN WILD Date of Exam: 07/09/2020 Medical Rec #:  [id number]      Height:       70.0 in Accession #:    [id number]     Weight:       172.8 lb Date of Birth:  10-07-1950      BSA:          1.962 m Patient Age:    68 years       BP:           103/75 mmHg Patient Gender: M              HR:           47 bpm. Exam Location:  [facility] Procedure: 2D Echo, Color Doppler and Cardiac Doppler Indications:     I46.9 Cardiac arrest  History:         Patient has no prior history of Echocardiogram examinations. No                  medical Hx.  Sonographer:     Charmayne Sheer RDCS (AE) Referring Phys:  [id number] Bradly Bienenstock Diagnosing Phys: Ida Rogue MD  Sonographer Comments: Technically challenging study due to limited acoustic windows and echo performed with patient supine and on artificial respirator. IMPRESSIONS  1. Left ventricular ejection fraction, by estimation, is 20 to 25%. The left ventricle has severely decreased function. The left ventricle demonstrates global hypokinesis. The left ventricular internal cavity size was severely dilated. Left ventricular diastolic parameters are indeterminate.  2. Right ventricular systolic function is moderately reduced. The right ventricular size is normal.  3. Left atrial size was moderately dilated.  4. The aortic valve was not well visualized. Aortic valve regurgitation is not visualized. Unable to estimate gradient. FINDINGS  Left Ventricle: Left ventricular ejection fraction, by estimation, is 20 to 25%. The left ventricle has severely decreased function. The left ventricle demonstrates global hypokinesis. The left ventricular internal cavity size was severely dilated. There is no left ventricular hypertrophy. Left ventricular diastolic parameters are indeterminate. Right Ventricle: The right ventricular size is normal. No increase in right ventricular wall thickness. Right ventricular systolic function is moderately reduced. Left Atrium: Left atrial size was moderately  dilated. Right Atrium: Right atrial size was normal in size. Pericardium: There is no evidence of pericardial effusion. Mitral  Valve: The mitral valve is normal in structure. No evidence of mitral valve regurgitation. No evidence of mitral valve stenosis. MV peak gradient, 2.2 mmHg. The mean mitral valve gradient is 1.0 mmHg. Tricuspid Valve: The tricuspid valve is normal in structure. Tricuspid valve regurgitation is not demonstrated. No evidence of tricuspid stenosis. Aortic Valve: The aortic valve was not well visualized. Aortic valve regurgitation is not visualized. Unabvle to exclude stenosis. Grossly Aortic valve mean gradient measures 7.0 mmHg. Aortic valve peak gradient measures 13.7 mmHg. Pulmonic Valve: The pulmonic valve was normal in structure. Pulmonic valve regurgitation is not visualized. No evidence of pulmonic stenosis. Aorta: The aortic root is normal in size and structure. Venous: The inferior vena cava is normal in size with greater than 50% respiratory variability, suggesting right atrial pressure of 3 mmHg. IAS/Shunts: No atrial level shunt detected by color flow Doppler.  LEFT VENTRICLE PLAX 2D LVIDd:         6.60 cm LVIDs:         6.30 cm LV PW:         1.60 cm LV IVS:        1.20 cm LVOT diam:     1.90 cm LV SV:         23 LV SV Index:   12 LVOT Area:     2.84 cm  LEFT ATRIUM           Index LA diam:      5.20 cm 2.65 cm/m LA Vol (A4C): 53.3 ml 27.17 ml/m  AORTIC VALVE AV Area (Vmax):    0.75 cm AV Area (Vmean):   0.73 cm AV Area (VTI):     0.74 cm AV Vmax:           185.00 cm/s AV Vmean:          120.000 cm/s AV VTI:            0.315 m AV Peak Grad:      13.7 mmHg AV Mean Grad:      7.0 mmHg LVOT Vmax:         49.00 cm/s LVOT Vmean:        30.800 cm/s LVOT VTI:          0.082 m LVOT/AV VTI ratio: 0.26  AORTA Ao Root diam: 2.50 cm MITRAL VALVE MV Area (PHT): 4.71 cm    SHUNTS MV Area VTI:   0.79 cm    Systemic VTI:  0.08 m MV Peak grad:  2.2 mmHg    Systemic Diam: 1.90 cm MV Mean  grad:  1.0 mmHg MV Vmax:       0.75 m/s MV Vmean:      41.7 cm/s MV Decel Time: 161 msec MV E velocity: 46.70 cm/s MV A velocity: 70.70 cm/s MV E/A ratio:  0.66 Ida Rogue MD Electronically signed by Ida Rogue MD Signature Date/Time: 07/09/2020/4:59:51 PM    Final     Assessment & Plan    70 year old male with history of hypertension, diabetes, coronary disease status post PCI, history of bioprosthetic aortic prosthesis and an ejection fraction of 20%.  He was intubated and was seen by [facility] and electrophysiology for V. tach and torsades.  He was discharged on amiodarone.  He returns after several VF arrest episodes.  Not felt to be a candidate for STEMI protocol cath.  Was placed on amiodarone drip was held due to bradycardia.  Currently in sinus rhythm unable to give a history  1.  VFetiology likely multifactorial to include severe cardiomyopathy, prolonged QT, questionable compliance with medications.  He is currently not on an amiodarone drip.  We will restart this without bolus following his heart rate and rhythm.  Echocardiogram was read as showing persistent EF of 20%.  Patient does have a long QT syndrome.  We will need to discuss with the electrophysiology team that monitor him during his previous admission regarding plans.  Not a candidate for AICD at present.  We will need to determine the extent of probable anoxic encephalopathy.  Not a candidate for left heart cath.  2.  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  Will need neurology evaluation to assist with management and assessment of this.  3.  Cardiomyopathy-likely multifactorial including ischemic as well as nonischemic factors.  Currently hemodynamically stable not requiring pressors.  4.  Diabetes-we will treat     Signed, Javier Docker. Salik Grewell MD 07/09/2020, 5:19 PM  Pager: [phone]

## 2020-07-09 NOTE — Progress Notes (Signed)
*  PRELIMINARY RESULTS* Echocardiogram 2D Echocardiogram has been performed.  Travis Maxwell M [name] 07/09/2020, 9:12 AM 

## 2020-07-09 NOTE — Progress Notes (Signed)
Patient not synchronized with vent. shivering with abnormal twitching movement.  Dr. Everardo All and Dr Iver Nestle notified.  EEG technician at bedside to start EEG.

## 2020-07-09 NOTE — Progress Notes (Signed)
Vecuronium 10mg  IV given for shivering.

## 2020-07-09 NOTE — Procedures (Signed)
Central Venous Catheter Insertion Procedure Note  Travis Maxwell  [id number]  [birth year]  Date:07/09/20  Time:3:00 PM   Provider Performing:Shikha Bibb D Elvina Sidle   Procedure: Insertion of Non-tunneled Central Venous Catheter(36556) with US guidance (34196)   Indication(s) Medication administration and Difficult access  Consent Unable to obtain consent due to emergent nature of procedure.  Anesthesia Topical only with 1% lidocaine   Timeout Verified patient identification, verified procedure, site/side was marked, verified correct patient position, special equipment/implants available, medications/allergies/relevant history reviewed, required imaging and test results available.  Sterile Technique Maximal sterile technique including full sterile barrier drape, hand hygiene, sterile gown, sterile gloves, mask, hair covering, sterile ultrasound probe cover (if used).  Procedure Description Area of catheter insertion was cleaned with chlorhexidine and draped in sterile fashion.  With real-time ultrasound guidance a central venous catheter was placed into the right internal jugular vein. Nonpulsatile blood flow and easy flushing noted in all ports.  The catheter was sutured in place and sterile dressing applied.  Complications/Tolerance None; patient tolerated the procedure well. Chest X-ray is ordered to verify placement for internal jugular or subclavian cannulation.   Chest x-ray is not ordered for femoral cannulation.  EBL Minimal  Specimen(s) None   Line secured at the 17 cm mark.  BIOPATCH applied to the insertion site.   [name], AGACNP-BC [facility] Prefer epic messenger for cross cover needs If after hours, please call E-link

## 2020-07-09 NOTE — Progress Notes (Signed)
Heparin stopped as per Pharmacy. Will re start after 1 hour.

## 2020-07-09 NOTE — Progress Notes (Signed)
Pharmacy Antibiotic Note  Travis Maxwell is a 70 y.o. male admitted on 07/08/2020 with pneumonia.  Pharmacy has been consulted for Cefepime and Vancomycin dosing.  Per H&P, continue empiric Vancomycin and Cefepime pending cultures.  Plan: Ordered Cefepime 2 gm q12h per indication and current renal fxn.  Vancomycin 05/04 2311 Pt given LD of 1750 mg Vancomycin 1250 mg IV Q 24 hrs.  Goal AUC 400-550. Expected AUC: 476.5 SCr used: 1.44  Pharmacy will continue to follow and adjust abx dosing if warranted.   Height: 5\' 10"  (177.8 cm) Weight: 78.4 kg (172 lb 13.5 oz) IBW/kg (Calculated) : 73  Temp (24hrs), Avg:95.8 F (35.4 C), Min:90 F (32.2 C), Max:99.1 F (37.3 C)  Recent Labs  Lab 07/08/20 1806 07/08/20 1809 07/08/20 2159 07/09/20 0122 07/09/20 0124  WBC 38.5*  --   --   --  31.6*  CREATININE 1.62*  --  1.68* 1.44*  --   LATICACIDVEN  --  5.8* 2.4* 3.9*  --     Estimated Creatinine Clearance: 50.7 mL/min (A) (by C-G formula based on SCr of 1.44 mg/dL (H)).    Not on File  Antimicrobials this admission: 05/04 Cefepime >>  05/04 Vancomcyin >>   Microbiology results: 05/04 BCx: Pending 05/04 UCx: Pending  05/04 RespCx: Pending   Thank you for allowing pharmacy to be a part of this patient's care.  07/04, PharmD, MBA 07/09/2020 2:26 AM

## 2020-07-09 NOTE — Progress Notes (Signed)
Initial Nutrition Assessment  DOCUMENTATION CODES:   Non-severe (moderate) malnutrition in context of chronic illness  INTERVENTION:   Once nasogastric tube in place:  Vital 1.2 @60ml /hr- Initiate at 69ml/hr and increase by 67ml/hr every 12 hours until goal rate is reached.   Pro-Source 6ml BID via tube, provides 40kcal and 11g of protein per serving   Free water flushes 59ml q4 hours to maintain tube patency   Regimen provides 1808kcal/day, 130g/day protein and 134ml/day free water   Pt at high refeed risk; recommend monitor potassium, magnesium and phosphorus labs daily until stable  NUTRITION DIAGNOSIS:   Moderate Malnutrition related to chronic illness (CHF) as evidenced by moderate fat depletion,moderate muscle depletion.  GOAL:   Patient will meet greater than or equal to 90% of their needs  MONITOR:   Vent status,Labs,Weight trends,Skin,I & O's  REASON FOR ASSESSMENT:   Ventilator    ASSESSMENT:   70 year old male with PMHx of HTN, DM, gout, AVR with bioprosthetic valve, GERD, CAD, CHF (EF 20%) and COVID-19 in 11/2019 who is admitted with cardiac arrest   Pt sedated and ventilated. Pt does not have enteral access. Pt currently on cooling protocol. Plan is for NGT placement today; will plan to start tube feeds once NGT in place. Pt is likely at refeed risk.   Per chart, pt appears fairly weight stable at baseline.   Medications reviewed and include: insulin, protonix, fentanyl, heparin, levophed  Labs reviewed: K 3.9 wnl, BUN 28(H), creat 1.71(H) Wbc- 31.6(H), Hgb 16.3, Hct 56.5, MCV 69.3(L), MCH 20.0(L), MCHC 28.8(L)  Patient is currently intubated on ventilator support MV: 10.8 L/min Temp (24hrs), Avg:94.9 F (34.9 C), Min:89.8 F (32.1 C), Max:99.1 F (37.3 C)  Propofol: none   MAP- >71mmHg   UOP- 77m   NUTRITION - FOCUSED PHYSICAL EXAM:  Flowsheet Row Most Recent Value  Orbital Region Mild depletion  Upper Arm Region Moderate depletion   Thoracic and Lumbar Region Moderate depletion  Buccal Region Mild depletion  Temple Region Moderate depletion  Clavicle Bone Region Severe depletion  Clavicle and Acromion Bone Region Severe depletion  Scapular Bone Region Unable to assess  Dorsal Hand Moderate depletion  Patellar Region Moderate depletion  Anterior Thigh Region Moderate depletion  Posterior Calf Region Moderate depletion  Edema (RD Assessment) None  Hair Reviewed  Eyes Reviewed  Mouth Reviewed  Skin Reviewed  Nails Reviewed     Diet Order:   Diet Order    None     EDUCATION NEEDS:   No education needs have been identified at this time  Skin:  Skin Assessment: Reviewed RN Assessment  Last BM:  pta  Height:   Ht Readings from Last 1 Encounters:  07/09/20 5\' 10"  (1.778 m)    Weight:   Wt Readings from Last 1 Encounters:  07/08/20 78.4 kg    Ideal Body Weight:  75.45 kg  BMI:  Body mass index is 24.8 kg/m.  Estimated Nutritional Needs:   Kcal:  1854kcal/day  Protein:  120-135g/day  Fluid:  2.0-2.3L/day  MS, RD, LDN Please refer to [facility] for RD and/or RD on-call/weekend/after hours pager

## 2020-07-09 NOTE — Progress Notes (Signed)
Dr. Everardo All at bedside and gave verbal order to discontinue Nimbex.

## 2020-07-09 NOTE — Consult Note (Signed)
ANTICOAGULATION CONSULT NOTE   Pharmacy Consult for heparin Indication: chest pain/ACS  Not on File  Patient Measurements: Height: 5\' 10"  (177.8 cm) Weight: 78.4 kg (172 lb 13.5 oz) IBW/kg (Calculated) : 73 Heparin Dosing Weight: 78.4 kg  Vital Signs: Temp: 91 F (32.8 C) (05/05 2200) Temp Source: Bladder (05/05 2200) BP: 95/70 (05/05 2200) Pulse Rate: 54 (05/05 2200)  Labs: Recent Labs    07/08/20 1806 07/08/20 1846 07/08/20 2041 07/08/20 2159 07/09/20 0122 07/09/20 0124 07/09/20 0617 07/09/20 0848 07/09/20 1605 07/09/20 2135  HGB 15.4  --   --   --   --  16.3  --   --   --   --   HCT 53.3*  --   --   --   --  56.5*  --   --   --   --   PLT 611*  --   --   --   --  598*  --   --   --   --   APTT  --  40*  --   --  >200*  --   --   --   --  63*  LABPROT  --  18.7*  --   --  20.0*  --   --   --   --   --   INR  --  1.6*  --   --  1.7*  --   --   --   --   --   HEPARINUNFRC  --   --   --   --   --  >1.10*  --   --   --   --   CREATININE 1.62*  --   --  1.68* 1.44*  --  1.51* 1.71* 1.21 0.94  TROPONINIHS 135*  --  1,247* 1,863*  --   --  1,367*  --   --   --     Estimated Creatinine Clearance: 77.7 mL/min (by C-G formula based on SCr of 0.94 mg/dL).   Medical History: No past medical history on file.  Medications:  Pt on Eliquis 5gm BID PTA.  Assessment: 06-20-1972 Doe is a 70 y.o. male who presents to the ER with CPR in progress.  Patient reportedly was having chest pain witnessed arrest. Patient arrives with spontaneous respirations and ROSC. Pharmacy has been consulted for heparin dosing for ACS.  Hgb: 15.4 and plts: 611 Heparin Dosing Weight: 78.4 kg   Goal of Therapy:  Heparin level 0.3-0.7 units/ml Monitor platelets by anticoagulation protocol: Yes  Goal aPTT 66-102  05/05 0124 HL >1.10, aPTT >200, INR 1.7 05/05 2135 aPTT 63   Plan:  APTT 63 is slightly subtherapeutic. Heparin infusion was held this afternoon for CVC placement, unsure for how  long. Will continue heparin infusion at 700 units/hr  Will recheck aPTT and HL in 6 hours. Due to pt being on DOAC PTA, will follow aPTT until correlation with HL. Daily HL and CBC while on heparin.  2136, PharmD, BCPS 07/09/2020 10:19 PM

## 2020-07-09 NOTE — Progress Notes (Signed)
eeg done °

## 2020-07-09 NOTE — Consult Note (Signed)
ANTICOAGULATION CONSULT NOTE   Pharmacy Consult for heparin Indication: chest pain/ACS  Not on File  Patient Measurements: Height: 5\' 10"  (177.8 cm) Weight: 78.4 kg (172 lb 13.5 oz) IBW/kg (Calculated) : 73 Heparin Dosing Weight: 78.4 kg  Vital Signs: Temp: 89.8 F (32.1 C) (05/05 0300) Temp Source: Rectal (05/05 0300) BP: 105/76 (05/05 0300) Pulse Rate: 48 (05/05 0300)  Labs: Recent Labs    07/08/20 1806 07/08/20 1846 07/08/20 2041 07/08/20 2159 07/09/20 0122 07/09/20 0124  HGB 15.4  --   --   --   --  16.3  HCT 53.3*  --   --   --   --  56.5*  PLT 611*  --   --   --   --  598*  APTT  --  40*  --   --  >200*  --   LABPROT  --  18.7*  --   --  20.0*  --   INR  --  1.6*  --   --  1.7*  --   HEPARINUNFRC  --   --   --   --   --  >1.10*  CREATININE 1.62*  --   --  1.68* 1.44*  --   TROPONINIHS 135*  --  1,247* 1,863*  --   --     Estimated Creatinine Clearance: 50.7 mL/min (A) (by C-G formula based on SCr of 1.44 mg/dL (H)).   Medical History: No past medical history on file.  Medications:  Pt on Eliquis 5gm BID PTA.  Assessment: Travis Maxwell is a 70 y.o. male who presents to the ER with CPR in progress.  Patient reportedly was having chest pain witnessed arrest. Patient arrives with spontaneous respirations and ROSC. Pharmacy has been consulted for heparin dosing for ACS.  Hgb: 15.4 and plts: 611 Heparin Dosing Weight: 78.4 kg   Goal of Therapy:  Heparin level 0.3-0.7 units/ml Monitor platelets by anticoagulation protocol: Yes  Goal aPTT 66-102  05/05 0124 HL >1.10, aPTT >200, INR 1.7   Plan:  Contacted RN and instructed to hold infusion for 1 hour. RN stated lab was drawn from opposite arm as heparin infusion by lab tech. Will restart heparin infusion at 700 units/hr  Due to pt being on DOAC PTA, will begin to follow aPTT until correlation with HL. Daily HL and CBC while on heparin.  07/05, PharmD, [facility] 07/09/2020 3:53 AM

## 2020-07-09 NOTE — Progress Notes (Signed)
Patient off fentanyl and versed for 1 hour with sporatic twitching movement.  No purposeful movement.  Dr. Everardo All requested versed be restated at 1mg /hr until EEG is obtained and patient is seen by neurologist.

## 2020-07-09 NOTE — Progress Notes (Signed)
Changed arctic sun machine as per discussion with NIC as pt's temperature not achieved as per protocol. MD made aware.

## 2020-07-09 NOTE — Progress Notes (Signed)
Restarted propofol at 5 mcg/kg/min as per Dr Iver Nestle (neurologist). Dr Francine Graven made aware and is in agreement.

## 2020-07-09 NOTE — Progress Notes (Signed)
eLink Physician-Brief Progress Note Patient Name: Travis Maxwell DOB: 02/03/1951 MRN: [id number]   Date of Service  07/09/2020  HPI/Events of Note  Hypoglycemia - Blood glucose = 66. Already received D50. Not on enteral nutrition.   eICU Interventions  Plan: 1. D10W IV infusion to run at 40 mL/hour.     Intervention Category Major Interventions: Other:  Lenell Antu 07/09/2020, 8:27 PM

## 2020-07-09 NOTE — Progress Notes (Addendum)
eLink Physician-Brief Progress Note Patient Name: Travis Maxwell DOB: 08/12/1950 MRN: [id number]   Date of Service  07/09/2020  HPI/Events of Note  Bedside RN indicates that they have been unable to insert OG tube and the oropharynx has become bruised, patient is on Heparin gtt, TTM in progress s/p cardiac arrest, also K+ is 3.4.  eICU Interventions  Hold further attempts at OG placement until the a.m. K+ repleted via iv route.        [name] 07/09/2020, 3:29 AM

## 2020-07-09 NOTE — Progress Notes (Signed)
Patient continues to shiver.  Vecuronium 10mg  IV given.

## 2020-07-09 NOTE — Progress Notes (Addendum)
BP 79/65 Dr. Everardo All notified.  Levophed stared for hypotension.  Dr. Everardo All at bedside and gave verbal order to turn sedation off.  Fentanyl and versed stopped as per MD request.

## 2020-07-09 NOTE — Progress Notes (Signed)
Dr. Lady Klooster at bedside to assess patient.  Amiodarone to be restarted at 30mg /hr

## 2020-07-09 NOTE — Progress Notes (Signed)
Heparin placed on hold for central line placement.

## 2020-07-09 NOTE — Progress Notes (Signed)
Dr. Everardo All notified of being unable to palpate or doppler dorsalis pedis,  Posterior tibial, or popliteal bilaterally.  Femoral pulses doppler but not palpable. MD to order arterial ultrasound.

## 2020-07-09 NOTE — Procedures (Signed)
Arterial Catheter Insertion Procedure Note  Travis Maxwell  [id number]  Oct 08, 1950  Date:07/09/20  Time:3:00 PM    Provider Performing: Judithe Modest    Procedure: Insertion of Arterial Line (32202) with US guidance (54270)   Indication(s) Blood pressure monitoring and/or need for frequent ABGs  Consent Unable to obtain consent due to emergent nature of procedure.  Anesthesia None   Time Out Verified patient identification, verified procedure, site/side was marked, verified correct patient position, special equipment/implants available, medications/allergies/relevant history reviewed, required imaging and test results available.   Sterile Technique Maximal sterile technique including full sterile barrier drape, hand hygiene, sterile gown, sterile gloves, mask, hair covering, sterile ultrasound probe cover (if used).   Procedure Description Area of catheter insertion was cleaned with chlorhexidine and draped in sterile fashion. With real-time ultrasound guidance an arterial catheter was placed into the right femoral artery.  Appropriate arterial tracings confirmed on monitor.     Complications/Tolerance None; patient tolerated the procedure well.   EBL Minimal   Specimen(s) None   BIOPATCH applied to the insertion site.      [name], AGACNP-BC [facility] Prefer epic messenger for cross cover needs If after hours, please call E-link

## 2020-07-09 NOTE — Progress Notes (Addendum)
Propofol started per Dr. Everardo All verbal order.

## 2020-07-09 NOTE — Consult Note (Signed)
Neurology Consultation Reason for Consult: Myoclonic activity s/p Vfib arrest  Requesting Physician: Luciano Cutter  CC: Cardiac arrest  History is obtained from: Chart review; unable to reach family and patient comatose  HPI: Travis Maxwell is a 70 y.o. male with a past medical history significant for coronary artery disease (proximal RCA stent 2019), congestive heart failure (EF 20 to 25% 01/21/2020) hypertension, diabetes, aortic valve replacement, paroxysmal atrial fibrillation with RVR (on Eliquis), mood disorder (on olanzapine), ongoing tobacco abuse, malnutrition  Per Dr. Lanora Manis note 21:32 on 5/4, family reports that the patient was sleepier than normal on the morning prior to presentation and had been complaining of feeling hot along with chest discomfort.  He was found by his brother and noted to be unresponsive when EMS was called, with family providing bystander CPR.  Initial rhythm was reportedly V. fib for which she was shocked twice and was given amiodarone.  He required 3 doses of epinephrine and ROSC was achieved after several minutes.  Initial head CT obtained 5/4 20:00 (with patient arrival at 17:36) was unrevealing for acute intracranial process.  He is being managed with targeted temperature protocol, troponin peak at 1863 on 5/4 2200, with worsening renal function with creatinine of 1.71, now improving to 1.2  Noted to have sporadic twitching without purposeful movement when sedation was paused today, for which neurology is consulted.  He was last hospitalized in December 2021 for respiratory distress found to be in cardiogenic shock with Pseudomonas/MRSA pneumonia with hospital course complicated by arrhythmia (V. tach/torsades), as well as encephalopathy and poor oral intake.  His olanzapine was reduced from 10 every 8 to 5 every 8 hours --unfortunately when it was held entirely he was irritable, angry, easily confused, trying to crawl out of bed and endangering himself and  therefore despite the QTc prolonging effects this was felt to be necessary medication (previous trials of benzodiazepines felt to be not only ineffective but probably counterproductive).  There was additionally concern for some underlying dementia.  Goals of care were discussed and family felt full CODE STATUS remained appropriate both at his last hospitalization and currently.  ROS:  Unable to obtain due to altered mental status.   No past medical history on file. - Please see linked chart  No family history on file. Unable to assess secondary to patient's mental status, please see linked chart   Social History:  has no history on file for tobacco use, alcohol use, and drug use. Unable to assess secondary to patient's mental status, please see linked chart   Ex am: Current vital signs: BP 119/84   Pulse (!) 55   Temp (!) 91.8 F (33.2 C) (Bladder)   Resp (!) 22   Ht 5\' 10"  (1.778 m)   Wt 78.4 kg   SpO2 100%   BMI 24.80 kg/m  Vital signs in last 24 hours: Temp:  [89.8 F (32.1 C)-99.1 F (37.3 C)] 91.8 F (33.2 C) (05/05 1000) Pulse Rate:  [37-119] 55 (05/05 1100) Resp:  [14-52] 22 (05/05 0700) BP: (79-177)/(58-120) 119/84 (05/05 1100) SpO2:  [88 %-100 %] 100 % (05/05 1100) FiO2 (%):  [40 %-60 %] 50 % (05/05 1100) Weight:  [78.4 kg] 78.4 kg (05/04 1833)   Physical Exam  Constitutional: Chronically ill appearing Psych: Unresponsive Eyes: No scleral injection or edema HENT: ETT in place  MSK: no joint deformities.  Cardiovascular: Normal rate and regular rhythm.  Respiratory: Breathing over the vent GI: Soft.  No distension.  Skin: Warm  dry and intact visible skin  Neuro - with sedation held  Mental Status: Opens eyes during noxious stimulation of the feet Does not follow any commands Cranial Nerves: II: No blink to threat. Pupils are equal, round, and reactive to light, sluggish, 2.5 to 2 mm III,IV, VI/VIII: EOM absent V/VII: Facial sensation is symmetric to  saline corneal stimulation VIII: No response to voice X/XI: Weak cough/gag XII: Unable to assess tongue protrusion secondary to patient's mental status  Motor/Sensory: Tone is low throughout. Bulk is normal.  Slight myoclonic jerking of the bilateral upper extremities with noxious stim. Trace movement bilateral lower extremities too slight to characterize Deep Tendon Reflexes: 2+ biceps and patellae bilaterally Cerebellar: Unable to assess secondary to patient's mental status    I have reviewed labs in epic and the results pertinent to this consultation are: Cr 1.2    I have personally reviewed the images obtained: Head CT 07/08/2020 without acute intracranial process (less than 6 hours from event)   EEG: This study initially showed myoclonic seizures every few seconds.  Propofol was started at around 1245 after which myoclonic seizures stopped and EEG was suggestive of generalized epileptogenicity  profound diffuse encephalopathy. These EEG findings are suggestive of anoxic/hypoxic brain injury.  Impression: Travis Maxwell is a 70 y.o. s/p Vfib arrest. While EEG is concerning for anoxic brain injury, at this time exam is notable for fairly intact brainstem function. Seizures responded readily to sedation with versed and propofol and therefore recommend continuing these medications; will also start keppra. Formal prognostication 24 hours after rewarming.   Recommendations: - Please continue versed 3 mg/hr  - Continue propofol 10 mcg/hr if heart rates allow - Keppra 2000 mg loading dose followed by 1000 mg BID, to be adjusted as needed for renal function:  Estimated Creatinine Clearance: 60.3 mL/min (by C-G formula based on SCr of 1.21 mg/dL).   Standard Keppra dosing:  CrCl 80 to 130 mL/minute/1.73 m2: 500 mg to 1.5 g every 12 hours.  CrCl 50 to <80 mL/minute/1.73 m2: 500 mg to 1 g every 12 hours.  CrCl 30 to <50 mL/minute/1.73 m2: 250 to 750 mg every 12 hours.  CrCl 15 to <30  mL/minute/1.73 m2: 250 to 500 mg every 12 hours.  CrCl <15 mL/minute/1.73 m2: 250 to 500 mg every 24 hours (expert opinion). - Neurology will continue to follow  Brooke Dare MD-PhD [facility] [phone] [facility] coverage for [facility] is from 8 AM to 4 AM in-house and 4 PM to 8 PM by telephone/video. 8 PM to 8 AM emergent questions or overnight urgent questions should be addressed to Teleneurology On-call or [facility] neurohospitalist; contact information can be found on AMION  Total critical care time: 45 minutes   Critical care time was exclusive of separately billable procedures and treating other patients.   Critical care was necessary to treat or prevent imminent or life-threatening deterioration.   Critical care was time spent personally by me on the following activities: development of treatment plan with patient and/or surrogate as well as nursing, discussions with consultants/primary team, evaluation of patient's response to treatment, examination of patient, obtaining history from patient or surrogate, ordering and performing treatments and interventions, ordering and review of laboratory studies, ordering and review of radiographic studies, and re-evaluation of patient's condition as needed, as documented above.

## 2020-07-09 NOTE — Procedures (Signed)
Patient Name: Travis Maxwell  MRN: [id number]  Epilepsy Attending: Charlsie Quest  Referring Physician/Provider: Dr. Luciano Cutter Date: 07/09/2020  Duration: 33.41 mins  Patient history: 70 year old male status post V. fib arrest.  EEG to evaluate seizures.  Level of alertness: Comatose  AEDs during EEG study: Propofol, Versed  Technical aspects: This EEG study was done with scalp electrodes positioned according to the 10-20 International system of electrode placement. Electrical activity was acquired at a sampling rate of 500Hz  and reviewed with a high frequency filter of 70Hz  and a low frequency filter of 1Hz . EEG data were recorded continuously and digitally stored.   Description: EEG initially showed continuous generalized background attenuation. Patient was also noted to have brief full body jerks every few seconds. Concomitant EEG showed generalized spike consistent with myoclonic seizure.  Propofol was started at around 1245 after which myoclonic seizures stopped.  he was administered at 1305 after which eeg showed continuous generalized background attenuation with generalized periodic discharges at 1 Hz.  EEG was reactive to tactile stimulation.  Hyperventilation and photic stimulation were not performed.     ABNORMALITY -Myoclonic seizures, generalized -Periodic discharges, generalized -Background attenuation, generalized  IMPRESSION: This study initially showed myoclonic seizures every few seconds.  Propofol was started at around 1245 after which myoclonic seizures stopped and EEG was suggestive of generalized epileptogenicity  profound diffuse encephalopathy. These EEG findings are suggestive of anoxic/hypoxic brain injury.  Dr was notified.  Lyah Millirons 

## 2020-07-09 NOTE — Progress Notes (Addendum)
Dr Lady Lochner made aware of pts QTc which is 612. Advised to keep Amiodarone infusion overnight.

## 2020-07-09 NOTE — Progress Notes (Deleted)
Pt was suctioned prior to extubation for a small amount of thick white secretions. Per Dr.'s order, he was extubated without incident and placed on a 4L nasal cannula. He is voicing and is without stridor.

## 2020-07-09 NOTE — Progress Notes (Addendum)
NAME:  Travis Maxwell, MRN:  [id number], DOB:  1951-01-28, LOS: 1 ADMISSION DATE:  07/08/2020, CONSULTATION DATE:  07/08/2020 REFERRING MD:  Dr. Roxan Hockey, CHIEF COMPLAINT:  Cardiac Arrest   History of Present Illness:  70 year old male  arrived to the ED s/p out of hospital V-fib Cardiac arrest. Patient had complained of feeling hot with chest discomfort and found unresponsive by brother who started CPR. EMS arrived and patient reported in V.fib s/p shock x 2 and epi x 2, amiodarone and ROSC was achieved. In the ED patient had ventricular arrhythmia requiring shock x 2. Cardiology evaluated the patient, no emergent plans for cardiac cath at this time per report. PCCM admitted.   Of note, patient has prior hospitalization for cardiac arrest in 02/2020 for cardiogenic shock witth VF/VT. Patient with prolonged QTc in setting of olanzapine (unable to be discontinued).  Pertinent  Medical History  HTN, T2DM, HLD, GERD, CAD (s/p PC toRCAI), AVR with bioprosthetic valve, systolic CHF with EF of 20%, and paroxysmal A. Fib  Significant Hospital Events: Including procedures, antibiotic start and stop dates in addition to other pertinent events   . 5/4 Admit to ICU for cardiac arrest. Cooled and paralyzed . 5/5 Paralytics discontinued. EEG suggestive of anoxic brain injury with myoclonic seizure and generalized epileptogenicity. Central line placed for IV access .   Interim History / Subjective:  On hypothermia protocol, sedated and paralyzed. Patient with subtle jerking movemnts when paralytics and sedation weaned. EEG with myoclonic seizures and generalized epileptogenicity suggestive of anoxic brain injury. Objective   Blood pressure 94/69, pulse (!) 55, temperature (!) 91.8 F (33.2 C), temperature source Bladder, resp. rate (!) 22, height 5\' 10"  (1.778 m), weight 78.4 kg, SpO2 100 %.    Vent Mode: PRVC FiO2 (%):  [40 %-60 %] 50 % Set Rate:  [16 bmp-22 bmp] 22 bmp Vt Set:  [500 mL] 500 mL PEEP:   [5 cmH20-8 cmH20] 8 cmH20   Intake/Output Summary (Last 24 hours) at 07/09/2020 1103 Last data filed at 07/09/2020 1000 Gross per 24 hour  Intake 2414.91 ml  Output 950 ml  Net 1464.91 ml   Filed Weights   07/08/20 1833  Weight: 78.4 kg   Physical Exam: General: Critically ill-appearing, no acute distress, artic sun in place HENT: [place], AT, ETT in place Eyes: EOMI, no scleral icterus Respiratory: Clear to auscultation bilaterally.  No crackles, wheezing or rales Cardiovascular: Bradycardic, RR, -M/R/G, no JVD GI: BS+, soft, nontender Extremities:-Edema,-tenderness Neuro: Unresponsive off sedation, pupils 3 mm, nonreactive, no withdrawal  Labs/imaging that I havepersonally reviewed  (right click and "Reselect all SmartList Selections" daily)   ABG 7.29/27/110 Peak trop 1863 LA 2.3, improved WBC 31, improved  UDS + cannibis Resolved Hospital Problem list     Assessment & Plan:    V-fib Cardiac Arrest Peak trop 1863 -Continuous telemetry -Serial EKGs -Maintain MAP greater than 65. No pressors currently required -Cardiology consulted, appreciate input ~no plans for emergent cardiac cath at this time  -Heparin drip -Trend lactic acid -F/u echo  Acute hypoxic respiratory failure in the setting cardiac arrest -Full vent support, implement lung protective strategies -Wean FiO2 and PEEP as tolerated to maintain O2 sats greater than 92% -Reviewed ABG. Increased RR  -Follow intermittent chest x-ray and ABG as needed -Implement VAP bundle -As needed bronchodilators -WUA/SBT daily. However mental status precludes extubation  Anoxic brain injury Seizure -CT head neg. EEG with seizures and epileptic activity -Versed gtt -Neurology consulted. Appreciate input  Leukocytosis -  improving, likely secondary to arrest -Monitor fever curve -Trend WBCs -F/u cultures -DC antibiotics for low suspicion for infection and concern for reducing sz threshold  Acute kidney injury -  resolved -Monitor I&O's / urinary output -Follow BMP -Ensure adequate renal perfusion -Avoid nephrotoxic agents as able -Replace electrolytes as indicated  Mildly Elevated LFTs -Trend LFTs -Check serum Tylenol  Hyperglycemia -CBG's -SSI -Follow ICU Hypo/Hyperglycemia protocol  GOC  On 5/5 I discussed patient's clinical status with his daughter Grandfield Sink. She is aware that his has seizures post-arrest and that can indicate a poor prognosis. Wishes to continue be aggressive with care even to the point of a trach if he does have some neurological recovery. However would not to prolong his life if he is not interactive. Plan for completion of cooling and repeat MRI   Best practice (right click and "Reselect all SmartList Selections" daily)  Diet:  NPO Pain/Anxiety/Delirium protocol (if indicated): Yes (RASS goal -1) VAP protocol (if indicated): Yes DVT prophylaxis: Systemic AC GI prophylaxis: PPI Glucose control:  SSI Yes Central venous access:  N/A Arterial line:  N/A Foley:  Yes, and it is still needed Mobility:  bed rest  PT consulted: N/A Last date of multidisciplinary goals of care discussion [5/5] Confirmed full code status. Code Status:  full code Disposition: ICU  The patient is critically ill with multiple organ systems failure and requires high complexity decision making for assessment and support, frequent evaluation and titration of therapies, application of advanced monitoring technologies and extensive interpretation of multiple databases.  Independent Critical Care Time: 60 Minutes.   Mechele Collin, M.D. [facility]monary[facility] Medicine 07/09/2020 5:10 PM   Please see Amion for pager number to reach on-call Pulmonary and Critical Care Team.

## 2020-07-10 ENCOUNTER — Inpatient Hospital Stay: Payer: Medicare Other

## 2020-07-10 DIAGNOSIS — I469 Cardiac arrest, cause unspecified: Secondary | ICD-10-CM | POA: Diagnosis not present

## 2020-07-10 LAB — CBC
HCT: 55 % — ABNORMAL HIGH (ref 39.0–52.0)
Hemoglobin: 16.1 g/dL (ref 13.0–17.0)
MCH: 19.5 pg — ABNORMAL LOW (ref 26.0–34.0)
MCHC: 29.3 g/dL — ABNORMAL LOW (ref 30.0–36.0)
MCV: 66.7 fL — ABNORMAL LOW (ref 80.0–100.0)
Platelets: 683 10*3/uL — ABNORMAL HIGH (ref 150–400)
RBC: 8.25 MIL/uL — ABNORMAL HIGH (ref 4.22–5.81)
RDW: 26.5 % — ABNORMAL HIGH (ref 11.5–15.5)
WBC: 16.7 10*3/uL — ABNORMAL HIGH (ref 4.0–10.5)
nRBC: 0 % (ref 0.0–0.2)

## 2020-07-10 LAB — POCT I-STAT, CHEM 8
BUN: 26 mg/dL — ABNORMAL HIGH (ref 8–23)
BUN: 26 mg/dL — ABNORMAL HIGH (ref 8–23)
BUN: 27 mg/dL — ABNORMAL HIGH (ref 8–23)
Calcium, Ion: 1.19 mmol/L (ref 1.15–1.40)
Calcium, Ion: 1.19 mmol/L (ref 1.15–1.40)
Calcium, Ion: 1.24 mmol/L (ref 1.15–1.40)
Chloride: 112 mmol/L — ABNORMAL HIGH (ref 98–111)
Chloride: 113 mmol/L — ABNORMAL HIGH (ref 98–111)
Chloride: 114 mmol/L — ABNORMAL HIGH (ref 98–111)
Creatinine, Ser: 0.9 mg/dL (ref 0.61–1.24)
Creatinine, Ser: 0.9 mg/dL (ref 0.61–1.24)
Creatinine, Ser: 0.9 mg/dL (ref 0.61–1.24)
Glucose, Bld: 153 mg/dL — ABNORMAL HIGH (ref 70–99)
Glucose, Bld: 176 mg/dL — ABNORMAL HIGH (ref 70–99)
Glucose, Bld: 183 mg/dL — ABNORMAL HIGH (ref 70–99)
HCT: 57 % — ABNORMAL HIGH (ref 39.0–52.0)
HCT: 57 % — ABNORMAL HIGH (ref 39.0–52.0)
HCT: 58 % — ABNORMAL HIGH (ref 39.0–52.0)
Hemoglobin: 19.4 g/dL — ABNORMAL HIGH (ref 13.0–17.0)
Hemoglobin: 19.4 g/dL — ABNORMAL HIGH (ref 13.0–17.0)
Hemoglobin: 19.7 g/dL — ABNORMAL HIGH (ref 13.0–17.0)
Potassium: 4.2 mmol/L (ref 3.5–5.1)
Potassium: 4.5 mmol/L (ref 3.5–5.1)
Potassium: 4.6 mmol/L (ref 3.5–5.1)
Sodium: 140 mmol/L (ref 135–145)
Sodium: 140 mmol/L (ref 135–145)
Sodium: 141 mmol/L (ref 135–145)
TCO2: 15 mmol/L — ABNORMAL LOW (ref 22–32)
TCO2: 16 mmol/L — ABNORMAL LOW (ref 22–32)
TCO2: 16 mmol/L — ABNORMAL LOW (ref 22–32)

## 2020-07-10 LAB — BASIC METABOLIC PANEL WITH GFR
Anion gap: 8 (ref 5–15)
BUN: 24 mg/dL — ABNORMAL HIGH (ref 8–23)
CO2: 17 mmol/L — ABNORMAL LOW (ref 22–32)
Calcium: 8.2 mg/dL — ABNORMAL LOW (ref 8.9–10.3)
Chloride: 110 mmol/L (ref 98–111)
Creatinine, Ser: 1.2 mg/dL (ref 0.61–1.24)
GFR, Estimated: >60 mL/min (ref >60)
Glucose, Bld: 166 mg/dL — ABNORMAL HIGH (ref 70–99)
Potassium: 4.5 mmol/L (ref 3.5–5.1)
Sodium: 135 mmol/L (ref 135–145)

## 2020-07-10 LAB — HEPATIC FUNCTION PANEL
ALT: 94 U/L — ABNORMAL HIGH (ref 0–44)
AST: 79 U/L — ABNORMAL HIGH (ref 15–41)
Albumin: 3.1 g/dL — ABNORMAL LOW (ref 3.5–5.0)
Alkaline Phosphatase: 75 U/L (ref 38–126)
Bilirubin, Direct: 0.2 mg/dL (ref 0.0–0.2)
Indirect Bilirubin: 0.7 mg/dL (ref 0.3–0.9)
Total Bilirubin: 0.9 mg/dL (ref 0.3–1.2)
Total Protein: 6.6 g/dL (ref 6.5–8.1)

## 2020-07-10 LAB — HEPARIN LEVEL (UNFRACTIONATED): Heparin Unfractionated: >1.1 IU/mL — ABNORMAL HIGH (ref 0.30–0.70)

## 2020-07-10 LAB — GLUCOSE, CAPILLARY
Glucose-Capillary: 122 mg/dL — ABNORMAL HIGH (ref 70–99)
Glucose-Capillary: 139 mg/dL — ABNORMAL HIGH (ref 70–99)
Glucose-Capillary: 146 mg/dL — ABNORMAL HIGH (ref 70–99)
Glucose-Capillary: 147 mg/dL — ABNORMAL HIGH (ref 70–99)
Glucose-Capillary: 149 mg/dL — ABNORMAL HIGH (ref 70–99)
Glucose-Capillary: 152 mg/dL — ABNORMAL HIGH (ref 70–99)
Glucose-Capillary: 155 mg/dL — ABNORMAL HIGH (ref 70–99)
Glucose-Capillary: 157 mg/dL — ABNORMAL HIGH (ref 70–99)
Glucose-Capillary: 157 mg/dL — ABNORMAL HIGH (ref 70–99)
Glucose-Capillary: 162 mg/dL — ABNORMAL HIGH (ref 70–99)
Glucose-Capillary: 165 mg/dL — ABNORMAL HIGH (ref 70–99)
Glucose-Capillary: 168 mg/dL — ABNORMAL HIGH (ref 70–99)
Glucose-Capillary: 170 mg/dL — ABNORMAL HIGH (ref 70–99)
Glucose-Capillary: 175 mg/dL — ABNORMAL HIGH (ref 70–99)
Glucose-Capillary: 180 mg/dL — ABNORMAL HIGH (ref 70–99)
Glucose-Capillary: 185 mg/dL — ABNORMAL HIGH (ref 70–99)
Glucose-Capillary: 92 mg/dL (ref 70–99)

## 2020-07-10 LAB — BASIC METABOLIC PANEL
Anion gap: 6 (ref 5–15)
Anion gap: 9 (ref 5–15)
BUN: 25 mg/dL — ABNORMAL HIGH (ref 8–23)
BUN: 26 mg/dL — ABNORMAL HIGH (ref 8–23)
CO2: 17 mmol/L — ABNORMAL LOW (ref 22–32)
CO2: 17 mmol/L — ABNORMAL LOW (ref 22–32)
Calcium: 8.1 mg/dL — ABNORMAL LOW (ref 8.9–10.3)
Calcium: 8.2 mg/dL — ABNORMAL LOW (ref 8.9–10.3)
Chloride: 112 mmol/L — ABNORMAL HIGH (ref 98–111)
Chloride: 109 mmol/L (ref 98–111)
Creatinine, Ser: 1.05 mg/dL (ref 0.61–1.24)
Creatinine, Ser: 1.35 mg/dL — ABNORMAL HIGH (ref 0.61–1.24)
GFR, Estimated: >60 mL/min (ref >60)
GFR, Estimated: 57 mL/min — ABNORMAL LOW (ref >60)
Glucose, Bld: 196 mg/dL — ABNORMAL HIGH (ref 70–99)
Glucose, Bld: 191 mg/dL — ABNORMAL HIGH (ref 70–99)
Potassium: 4.3 mmol/L (ref 3.5–5.1)
Potassium: 4.4 mmol/L (ref 3.5–5.1)
Sodium: 135 mmol/L (ref 135–145)
Sodium: 135 mmol/L (ref 135–145)

## 2020-07-10 LAB — COMPREHENSIVE METABOLIC PANEL
ALT: 100 U/L — ABNORMAL HIGH (ref 0–44)
AST: 77 U/L — ABNORMAL HIGH (ref 15–41)
Albumin: 3.4 g/dL — ABNORMAL LOW (ref 3.5–5.0)
Alkaline Phosphatase: 83 U/L (ref 38–126)
Anion gap: 9 (ref 5–15)
BUN: 24 mg/dL — ABNORMAL HIGH (ref 8–23)
CO2: 16 mmol/L — ABNORMAL LOW (ref 22–32)
Calcium: 8.4 mg/dL — ABNORMAL LOW (ref 8.9–10.3)
Chloride: 111 mmol/L (ref 98–111)
Creatinine, Ser: 1.25 mg/dL — ABNORMAL HIGH (ref 0.61–1.24)
GFR, Estimated: >60 mL/min (ref >60)
Glucose, Bld: 130 mg/dL — ABNORMAL HIGH (ref 70–99)
Potassium: 4.6 mmol/L (ref 3.5–5.1)
Sodium: 136 mmol/L (ref 135–145)
Total Bilirubin: 0.7 mg/dL (ref 0.3–1.2)
Total Protein: 7.1 g/dL (ref 6.5–8.1)

## 2020-07-10 LAB — APTT
aPTT: 161 seconds (ref 24–36)
aPTT: 50 seconds — ABNORMAL HIGH (ref 24–36)
aPTT: 79 seconds — ABNORMAL HIGH (ref 24–36)

## 2020-07-10 LAB — LACTIC ACID, PLASMA
Lactic Acid, Venous: 2.2 mmol/L (ref 0.5–1.9)
Lactic Acid, Venous: 2.2 mmol/L (ref 0.5–1.9)
Lactic Acid, Venous: 2 mmol/L (ref 0.5–1.9)

## 2020-07-10 LAB — MAGNESIUM
Magnesium: 2 mg/dL (ref 1.7–2.4)
Magnesium: 1.9 mg/dL (ref 1.7–2.4)

## 2020-07-10 LAB — URINE CULTURE: Culture: NO GROWTH

## 2020-07-10 LAB — PHOSPHORUS: Phosphorus: 4.3 mg/dL (ref 2.5–4.6)

## 2020-07-10 MED ORDER — PROPOFOL 1000 MG/100ML IV EMUL: 5.0000 ug/kg/min | INTRAVENOUS | Status: DC

## 2020-07-10 MED ORDER — SODIUM CHLORIDE 0.9 % IV SOLN: 250.0000 mL | INTRAVENOUS | 0 refills | Status: DC

## 2020-07-10 MED ORDER — ARTIFICIAL TEARS OPHTHALMIC OINT: 1.0000 "application " | TOPICAL_OINTMENT | Freq: Three times a day (TID) | OPHTHALMIC | Status: DC

## 2020-07-10 MED ORDER — VITAL AF 1.2 CAL PO LIQD: 1000.0000 mL | ORAL | Status: DC

## 2020-07-10 MED ORDER — DEXTROSE 10 % IV SOLN: 30.0000 mL/h | INTRAVENOUS | Status: DC

## 2020-07-10 MED ORDER — NOREPINEPHRINE 4 MG/250ML-% IV SOLN: 2.0000 ug/min | INTRAVENOUS | Status: DC

## 2020-07-10 MED ORDER — PROSOURCE TF PO LIQD
45.0000 mL | Freq: Two times a day (BID) | ORAL | Status: DC
  Administered 2020-07-10 – 2020-07-13 (×7): 45 mL
  Filled 2020-07-10 (×8): qty 45

## 2020-07-10 MED ORDER — PROSOURCE TF PO LIQD: 45.0000 mL | Freq: Two times a day (BID) | ORAL | Status: DC

## 2020-07-10 MED ORDER — PHENOBARBITAL SODIUM 65 MG/ML IJ SOLN
1.0000 mg/kg/d | Freq: Two times a day (BID) | INTRAMUSCULAR | Status: DC
  Administered 2020-07-11 – 2020-07-13 (×5): 39 mg via INTRAVENOUS
  Filled 2020-07-10 (×5): qty 1

## 2020-07-10 MED ORDER — PHENOBARBITAL SODIUM 65 MG/ML IJ SOLN
10.0000 mg/kg | Freq: Once | INTRAMUSCULAR | Status: DC
  Filled 2020-07-10: qty 12.1

## 2020-07-10 MED ORDER — INSULIN ASPART 100 UNIT/ML IJ SOLN: 0.0000 [IU] | INTRAMUSCULAR | 11 refills | Status: DC

## 2020-07-10 MED ORDER — FREE WATER
30.0000 mL | Status: DC
  Administered 2020-07-10 – 2020-07-13 (×17): 30 mL

## 2020-07-10 MED ORDER — FENTANYL BOLUS VIA INFUSION: 25.0000 ug | INTRAVENOUS | 0 refills | Status: DC | PRN

## 2020-07-10 MED ORDER — HEPARIN (PORCINE) 25000 UT/250ML-% IV SOLN: 750.0000 [IU]/h | INTRAVENOUS | Status: DC

## 2020-07-10 MED ORDER — AMIODARONE HCL IN DEXTROSE 360-4.14 MG/200ML-% IV SOLN: 30.0000 mg/h | INTRAVENOUS | Status: DC

## 2020-07-10 MED ORDER — CHLORHEXIDINE GLUCONATE CLOTH 2 % EX PADS: 6.0000 | MEDICATED_PAD | Freq: Every day | CUTANEOUS | Status: DC

## 2020-07-10 MED ORDER — SODIUM CHLORIDE 0.9 % IV SOLN
Freq: Once | INTRAVENOUS | Status: AC
  Filled 2020-07-10: qty 100

## 2020-07-10 MED ORDER — SODIUM CHLORIDE 0.9 % IV SOLN
Freq: Once | INTRAVENOUS | Status: DC
  Filled 2020-07-10: qty 100

## 2020-07-10 MED ORDER — MIDAZOLAM 50MG/50ML (1MG/ML) PREMIX INFUSION: 0.5000 mg/h | INTRAVENOUS | Status: DC

## 2020-07-10 MED ORDER — PANTOPRAZOLE SODIUM 40 MG IV SOLR: 40.0000 mg | Freq: Every day | INTRAVENOUS | Status: DC

## 2020-07-10 MED ORDER — CHLORHEXIDINE GLUCONATE 0.12% ORAL RINSE (MEDLINE KIT): 15.0000 mL | Freq: Two times a day (BID) | OROMUCOSAL | 0 refills | Status: DC

## 2020-07-10 MED ORDER — VITAL AF 1.2 CAL PO LIQD
1000.0000 mL | ORAL | Status: DC
  Administered 2020-07-10 – 2020-07-13 (×4): 1000 mL

## 2020-07-10 MED ORDER — HEPARIN (PORCINE) 25000 UT/250ML-% IV SOLN
1250.0000 [IU]/h | INTRAVENOUS | Status: DC
  Administered 2020-07-10: 500 [IU]/h via INTRAVENOUS
  Administered 2020-07-11: 700 [IU]/h via INTRAVENOUS
  Administered 2020-07-12: 900 [IU]/h via INTRAVENOUS
  Administered 2020-07-13: 1250 [IU]/h via INTRAVENOUS
  Filled 2020-07-10 (×3): qty 250

## 2020-07-10 MED ORDER — LEVETIRACETAM IN NACL 1000 MG/100ML IV SOLN: 1000.0000 mg | Freq: Two times a day (BID) | INTRAVENOUS | Status: DC

## 2020-07-10 MED ORDER — FENTANYL 2500MCG IN NS 250ML (10MCG/ML) PREMIX INFUSION: 100.0000 ug/h | INTRAVENOUS | 0 refills | Status: DC

## 2020-07-10 MED ORDER — FREE WATER: 30.0000 mL | Status: DC

## 2020-07-10 NOTE — Consult Note (Signed)
ANTICOAGULATION CONSULT NOTE   Pharmacy Consult for heparin Indication: chest pain/ACS  Not on File  Patient Measurements: Height: 5\' 10"  (177.8 cm) Weight: 78.4 kg (172 lb 13.5 oz) IBW/kg (Calculated) : 73 Heparin Dosing Weight: 78.4 kg  Vital Signs: Temp: 92.5 F (33.6 C) (05/06 0500) Temp Source: Bladder (05/06 0500) BP: 105/71 (05/06 0530) Pulse Rate: 65 (05/06 0530)  Labs: Recent Labs    07/08/20 1806 07/08/20 1806 07/08/20 1846 07/08/20 2041 07/08/20 2159 07/09/20 0122 07/09/20 0124 07/09/20 0617 07/09/20 0848 07/09/20 2135 07/10/20 0025 07/10/20 0226 07/10/20 0337  HGB 15.4  --   --   --   --   --  16.3  --   --   --  19.4* 19.7* 16.1  HCT 53.3*  --   --   --   --   --  56.5*  --   --   --  57.0* 58.0* 55.0*  PLT 611*  --   --   --   --   --  598*  --   --   --   --   --  683*  APTT  --    < > 40*  --   --  >200*  --   --   --  63*  --   --  161*  LABPROT  --   --  18.7*  --   --  20.0*  --   --   --   --   --   --   --   INR  --   --  1.6*  --   --  1.7*  --   --   --   --   --   --   --   HEPARINUNFRC  --   --   --   --   --   --  >1.10*  --   --   --   --   --  >1.10*  CREATININE 1.62*  --   --   --  1.68* 1.44*  --  1.51*   < > 0.94 0.90 0.90 1.05  TROPONINIHS 135*  --   --  1,247* 1,863*  --   --  1,367*  --   --   --   --   --    < > = values in this interval not displayed.    Estimated Creatinine Clearance: 69.5 mL/min (by C-G formula based on SCr of 1.05 mg/dL).   Medical History: No past medical history on file.  Medications:  Pt on Eliquis 5gm BID PTA.  Assessment: Travis Maxwell is a 70 y.o. male who presents to the ER with CPR in progress.  Patient reportedly was having chest pain witnessed arrest. Patient arrives with spontaneous respirations and ROSC. Pharmacy has been consulted for heparin dosing for ACS.  Hgb: 15.4 and plts: 611 Heparin Dosing Weight: 78.4 kg   Goal of Therapy:  Heparin level 0.3-0.7 units/ml Monitor platelets  by anticoagulation protocol: Yes  Goal aPTT 66-102  05/05 0124 HL >1.10, aPTT >200, INR 1.7 05/05 2135 aPTT 63 05/06 0337 aPTT 161, HL >1.10   Plan:  Hold infusion for 1 hour.  Per RN, lab drawn from central line, with Heparin running in PIV line. Will decrease heparin infusion to 500 units/hr  Will recheck aPTT and HL in 6 hours after restart. Due to pt being on DOAC PTA, will follow aPTT until correlation with HL. Daily HL and CBC while  on heparin.  Otelia Sergeant, PharmD, [facility] 07/10/2020 5:46 AM

## 2020-07-10 NOTE — Progress Notes (Signed)
eLink Physician-Brief Progress Note Patient Name: Arlyn Buerkle DOB: Dec 08, 1950 MRN: [id number]   Date of Service  07/10/2020  HPI/Events of Note  Hyperglycemia - Blood glucose = 157 --> 139 --> 149  eICU Interventions  Plan: 1. Will decrease D10W IV infusion to 30 mL/hour.     Intervention Category Intermediate Interventions: Hyperglycemia - evaluation and treatment  Noella Kipnis Eugene 07/10/2020, 3:59 AM

## 2020-07-10 NOTE — Progress Notes (Addendum)
  Amiodarone Drug - Drug Interaction Consult Note  Recommendations: Pt not currently taking any interacting medications. If PTA meds (atorvastatin, furosemide, metoprolol) restarted, will need to continue monitoring vitals and monitor for myopathy.  Pt Qtc 628, cardiology aware of prolonged Qtc and EP recommending to persist with amiodarone despite this.   Amiodarone is metabolized by the cytochrome P450 system and therefore has the potential to cause many drug interactions. Amiodarone has an average plasma half-life of 50 days (range 20 to 100 days).   There is potential for drug interactions to occur several weeks or months after stopping treatment and the onset of drug interactions may be slow after initiating amiodarone.   []  Statins: Increased risk of myopathy. Simvastatin- restrict dose to 20mg  daily. Other statins: counsel patients to report any muscle pain or weakness immediately.  []  Anticoagulants: Amiodarone can increase anticoagulant effect. Consider warfarin dose reduction. Patients should be monitored closely and the dose of anticoagulant altered accordingly, remembering that amiodarone levels take several weeks to stabilize.  []  Antiepileptics: Amiodarone can increase plasma concentration of phenytoin, the dose should be reduced. Note that small changes in phenytoin dose can result in large changes in levels. Monitor patient and counsel on signs of toxicity.  []  Beta blockers: increased risk of bradycardia, AV block and myocardial depression. Sotalol - avoid concomitant use.  []   Calcium channel blockers (diltiazem and verapamil): increased risk of bradycardia, AV block and myocardial depression.  []   Cyclosporine: Amiodarone increases levels of cyclosporine. Reduced dose of cyclosporine is recommended.  []  Digoxin dose should be halved when amiodarone is started.  []  Diuretics: increased risk of cardiotoxicity if hypokalemia occurs.  []  Oral hypoglycemic agents (glyburide,  glipizide, glimepiride): increased risk of hypoglycemia. Patient's glucose levels should be monitored closely when initiating amiodarone therapy.   []  Drugs that prolong the QT interval:  Torsades de pointes risk may be increased with concurrent use - avoid if possible.  Monitor QTc, also keep magnesium/potassium WNL if concurrent therapy can't be avoided. Antibiotics: e.g. fluoroquinolones, erythromycin. . Antiarrhythmics: e.g. quinidine, procainamide, disopyramide, sotalol. . Antipsychotics: e.g. phenothiazines, haloperidol.  . Lithium, tricyclic antidepressants, and methadone  , PharmD Pharmacy Resident  07/10/2020 2:27 PM

## 2020-07-10 NOTE — Progress Notes (Signed)
Portable EEG completed,

## 2020-07-10 NOTE — TOC Initial Note (Signed)
Transition of Care [facility]) - Initial/Assessment Note    Patient Details  Name: Travis Maxwell MRN: [id number] Date of Birth: 1951/02/06  Transition of Care [facility]) CM/SW Contact:    Marina Goodell Phone Number: [phone] 07/10/2020, 4:53 PM  Clinical Narrative:                  Patient presents to [facility] due to chest pain, which led to cardiac arrest. Patient is currently in the ICU. Patient remain critically ill, on hypothermia protocol, sedated and paralyzed. Patient seen by neurology today.  ICU RN confirmed, patient will transfer to [facility]. Patient's daughter [name] (Daughter) [phone] is main contact.    Expected Discharge Plan: Home w Hospice Care Barriers to Discharge: Continued Medical Work up   Patient Goals and CMS Choice        Expected Discharge Plan and Services Expected Discharge Plan: Home w Hospice Care In-house Referral: Clinical Social Work   Post Acute Care Choice: NA Living arrangements for the past 2 months: Single Family Home Expected Discharge Date: 07/10/20                                    Prior Living Arrangements/Services Living arrangements for the past 2 months: Single Family Home Lives with:: Significant Other Patient language and need for interpreter reviewed:: Yes Do you feel safe going back to the place where you live?: Yes      Need for Family Participation in Patient Care: Yes (Comment) Care giver support system in place?: Yes (comment)   Criminal Activity/Legal Involvement Pertinent to Current Situation/Hospitalization: No - Comment as needed  Activities of Daily Living      Permission Sought/Granted Permission sought to share information with : Facility Medical sales representative    Share Information with NAME: [name] (Daughter)   [phone]           Emotional Assessment Appearance:: Appears older than stated age Attitude/Demeanor/Rapport: Unable to Assess Affect (typically observed): Unable  to Assess     Psych Involvement: No (comment)  Admission diagnosis:  Cardiac arrest (HCC) [I46.9] SOB (shortness of breath) [R06.02] Patient Active Problem List   Diagnosis Date Noted  . Malnutrition of moderate degree 07/09/2020  . Cardiac arrest (HCC) 07/08/2020   PCP:  No primary care provider on file. Pharmacy:  No Pharmacies Listed    Social Determinants of Health (SDOH) Interventions    Readmission Risk Interventions No flowsheet data found.

## 2020-07-10 NOTE — Progress Notes (Signed)
Re warming phase started at 0045. Settings double with RN Thurston Hole.

## 2020-07-10 NOTE — Care Plan (Signed)
Lab Results  Component Value Date   ALT 100 (H) 07/10/2020   AST 77 (H) 07/10/2020   ALKPHOS 83 07/10/2020   BILITOT 0.7 07/10/2020   Liver function is essentially stable from admission.  Given difficulty weaning sedation, will start second antiseizure agent phenobarbital, with initial 10 mg/kg loading dose given his renal function is not entirely normal and he remains borderline hypotensive though not currently requiring pressors.  Subsequently starting maintenance dose of 1 mg/kg/day divided twice daily.  Ordered level for 8 AM (post load but prior to first maintenance dose) to allow for continued titration based on levels.  Neurology will continue to follow, team at [facility] aware of pending transfer  Brooke Dare MD-PhD [facility] [phone] [facility] coverage for [facility] is from 8 AM to 4 AM in-house and 4 PM to 8 PM by telephone/video. 8 PM to 8 AM emergent questions or overnight urgent questions should be addressed to Teleneurology On-call or [facility] neurohospitalist; contact information can be found on AMION

## 2020-07-10 NOTE — Progress Notes (Signed)
Neurology Progress Note  Patient ID: Travis Maxwell is a 70 y.o. with PMHx of  has no past medical history on file.  Initially consulted for: S/p anoxic brain injury  Major interval events:  -Intermittently bradycardic especially with propofol -Per CCM team in discussion with family patient's goals of care would include trach and PEG even in the setting of his general medical prognosis  Subjective: -Unresponsive  Exam: Vitals:   07/10/20 1300 07/10/20 1400  BP: 100/75 112/83  Pulse: 79 88  Resp:  (!) 30  Temp: 98.1 F (36.7 C) 99 F (37.2 C)  SpO2: 98% 94%   Constitutional: Chronically ill appearing Psych: Unresponsive Eyes: No scleral injection or edema, ointment on eyes appropriate for his intubation HENT: ETT in place  MSK: no joint deformities.  Cardiovascular: Normal rate and regular rhythm.  Respiratory: Breathing over the vent occasionally GI: Soft.  No distension.  Skin: Warm dry and intact visible skin  Neuro - with sedation held  Mental Status: Intermittent blinking and jaw tremoring, no clear response to noxious stim Does not follow any commands Cranial Nerves: II: No clear blink to threat however the patient is frequently blinking. Pupils are equal, round, and reactive to light, sluggish, 3 to 2.5 mm.  Right pupil is slightly larger than the left III,IV, VI/VIII: EOM  present today, sluggish V/VII: Corneal stimulation difficult to assess secondary to frequent blinking, but no clear response VIII: No response to voice X/XI:  Gag and cough are absent today XII: Unable to assess tongue protrusion secondary to patient's mental status  Motor/Sensory: Tone is low throughout. Bulk is normal.  Slight myoclonic jerking of the bilateral upper extremities with noxious stim.  No movement in the bilateral lower extremities  Cerebellar: Unable to assess secondary to patient's mental status   Pertinent Labs: Creatinine 1.2 Lactate stable at 2   Basic Metabolic  Panel: Recent Labs  Lab 07/08/20 1833 07/08/20 2159 07/09/20 1605 07/09/20 2135 07/10/20 0025 07/10/20 0226 07/10/20 0337 07/10/20 0614 07/10/20 0812 07/10/20 1213  NA  --    < > 138 138   < > 140 135 140 135 135  K  --    < > 4.2 3.9   < > 4.6 4.3 4.2 4.4 4.5  CL  --    < > 111 112*   < > 112* 112* 113* 109 110  CO2  --    < > 18* 17*  --   --  17*  --  17* 17*  GLUCOSE  --    < > 74 218*   < > 183* 196* 176* 191* 166*  BUN  --    < > 27* 26*   < > 27* 25* 26* 26* 24*  CREATININE  --    < > 1.21 0.94   < > 0.90 1.05 0.90 1.35* 1.20  CALCIUM  --    < > 8.5* 8.6*  --   --  8.1*  --  8.2* 8.2*  MG 1.8  --   --  2.2  --   --  2.0  --   --   --    < > = values in this interval not displayed.    CBC: Recent Labs  Lab 07/08/20 1806 07/09/20 0124 07/10/20 0025 07/10/20 0226 07/10/20 0337 07/10/20 0614  WBC 38.5* 31.6*  --   --  16.7*  --   HGB 15.4 16.3 19.4* 19.7* 16.1 19.4*  HCT 53.3* 56.5* 57.0* 58.0* 55.0* 57.0*  MCV 69.0* 69.3*  --   --  66.7*  --   PLT 611* 598*  --   --  683*  --     Coagulation Studies: Recent Labs    07/08/20 1846 07/09/20 0122  LABPROT 18.7* 20.0*  INR 1.6* 1.7*    Description:EEG (personally reviewed), agree with Dr. Melynda Ripple read: "Initially showed continuous generalized 3 to 5 Hz theta-delta slowing. After propofol and Versed was stopped, EEG showed generalized periodic epileptiform discharges with overriding fast activity at 0.75-1.5Hz . EEG gradually worsened and showed generalized bursts of highly epileptiform discharges lasting 0.5 to 3 seconds alternating with brief 1 to 2-second periods of generalized background attenuation. As propofol and Versed was restarted, EEG again showed continuous generalized sharply contoured 3 to 5 Hz theta - delta slowing.Hyperventilation and photic stimulation were not performed."  Impression: Likely significant anoxic brain injury given ongoing seizure activity whenever sedation is paused.  However with  sedation patient's minimal brainstem reflexes also disappear.  Therefore, to be able to safely wean sedation this patient will need long-term EEG monitoring which will require transfer.  Recommendations: -Continue sedation at current rate (versed 5 mg, propofol  -Continue Keppra 1000 mg twice daily to be adjusted as needed for renal function  Estimated Creatinine Clearance: 60.8 mL/min (by C-G formula based on SCr of 1.2 mg/dL).   CrCl 80 to 130 mL/minute/1.73 m2: 500 mg to 1.5 g every 12 hours.  CrCl 50 to <80 mL/minute/1.73 m2: 500 mg to 1 g every 12 hours.  CrCl 30 to <50 mL/minute/1.73 m2: 250 to 750 mg every 12 hours.  CrCl 15 to <30 mL/minute/1.73 m2: 250 to 500 mg every 12 hours.  CrCl <15 mL/minute/1.73 m2: 250 to 500 mg every 24 hours (expert opinion). -Will obtain hepatic function panel and then consider addition of phenobarbital   -Long term AC will need to be changed from apixaban to warfain if this seizure medication is used  -MRI brain 3 to 5 days post event (5/7 through 5/9) to most sensitively aid prognostication  Brooke Dare MD-PhD [facility] [phone]

## 2020-07-10 NOTE — Discharge Summary (Signed)
Discharge Summary NAME:  Travis Maxwell, MRN:  [id number], DOB:  Mar 29, 1950, LOS: 2 ADMISSION DATE:  07/08/2020, CONSULTATION DATE:  07/08/2020 REFERRING MD:  Dr. Roxan Hockey, CHIEF COMPLAINT:  Cardiac Arrest   History of Present Illness:  70 year old male  arrived to the ED s/p out of hospital V-fib Cardiac arrest. Patient had complained of feeling hot with chest discomfort and found unresponsive by brother who started CPR. EMS arrived and patient reported in V.fib s/p shock x 2 and epi x 2, amiodarone and ROSC was achieved. In the ED patient had ventricular arrhythmia requiring shock x 2. Cardiology evaluated the patient, no emergent plans for cardiac cath at this time per report. PCCM admitted.   Of note, patient has prior hospitalization for cardiac arrest in 02/2020 for cardiogenic shock witth VF/VT. Patient with prolonged QTc in setting of olanzapine (unable to be discontinued).  Pertinent  Medical History  HTN, T2DM, HLD, GERD, CAD (s/p PC toRCAI), AVR with bioprosthetic valve, systolic CHF with EF of 20%, and paroxysmal A. Fib  Significant Hospital Events: Including procedures, antibiotic start and stop dates in addition to other pertinent events    5/4 Admit to ICU for cardiac arrest. Cooled and paralyzed  5/5 Paralytics discontinued. EEG suggestive of anoxic brain injury with myoclonic seizure and generalized epileptogenicity. Central line placed for IV access  5/6 rewarming started at midnight  Interim History / Subjective:  On hypothermia protocol, sedated and paralyzed. Patient with subtle jerking movemnts when paralytics and sedation weaned. EEG with myoclonic seizures and generalized epileptogenicity suggestive of anoxic brain injury.  Neurology recommending transfer to neuro ICU for LTM given that patient having SE and family wishes to pursue aggressive measures.    Objective   Blood pressure 90/69, pulse 71, temperature (!) 94.8 F (34.9 C), temperature source Bladder,  resp. rate (!) 22, height 5\' 10"  (1.778 m), weight 78.4 kg, SpO2 96 %. CVP:  [6 mmHg-11 mmHg] 7 mmHg   >  Vent Mode: PRVC FiO2 (%):  [35 %-50 %] 35 % Set Rate:  [22 bmp-124 bmp] 124 bmp Vt Set:  [500 mL] 500 mL PEEP:  [8 cmH20] 8 cmH20 Plateau Pressure:  [19 cmH20] 19 cmH20     Intake/Output Summary (Last 24 hours) at 07/10/2020 0826 Last data filed at 07/10/2020 0700    Gross per 24 hour  Intake 1595.97 ml  Output 1775 ml  Net -179.03 ml      Filed Weights   07/08/20 1833  Weight: 78.4 kg   Physical Exam: General: Critically ill-appearing, no acute distress, artic sun in place HENT: , AT, ETT in place Eyes: EOMI, no scleral icterus Respiratory: Clear to auscultation bilaterally.  No crackles, wheezing or rales Cardiovascular: Bradycardic, RR, -M/R/G, no JVD GI: BS+, soft, nontender Extremities:-Edema,-tenderness. Poor pulses. Able to ultrasound and see pulsatility  but not picked up by dopler. Will put in for formal 09/07/20 likely some PAD Neuro: Unresponsive off sedation, pupils 3 mm, sluggishly reactive. Jerkling noted as well when off propofol and versed  Labs/imaging that I havepersonally reviewed  (right click and "Reselect all SmartList Selections" daily)   ABG 7.29/27/110 Peak trop 1863 LA 2.3, improved WBC 31, improved  UDS + cannibis Resolved Hospital Problem list     Assessment & Plan:    V-fib Cardiac Arrest Peak trop 1863 -Continuous telemetry -Serial EKGs -Maintain MAP greater than 65. No pressors currently required -Cardiology consulted, appreciate input ~no plans for emergent cardiac cath at this time. Continue heparin drip and  amio infusion per cardiology -Heparin drip -Trend lactic acid -F/u echo  Acute hypoxic respiratory failure in the setting cardiac arrest -Full vent support, implement lung protective strategies -Wean FiO2 and PEEP as tolerated to maintain O2 sats greater than 92% -Reviewed ABG. Increased RR  -Follow intermittent  chest x-ray and ABG as needed -Implement VAP bundle -As needed bronchodilators -WUA/SBT daily. However mental status precludes extubation  Anoxic brain injury Seizure -CT head neg. EEG with seizures and epileptic activity -Versed gtt, propofol gtt, keppra -Neurology consulted. They recommend TLM for SE -MRI at 72 hours ordered  Leukocytosis - improving, likely secondary to arrest -Monitor fever curve -Trend WBCs -F/u cultures -Not on abx currenlty  Acute kidney injury - resolved -Monitor I&O's / urinary output -Follow BMP -Ensure adequate renal perfusion -Avoid nephrotoxic agents as able -Replace electrolytes as indicated  Mildly Elevated LFTs -Trend LFTs -Check serum Tylenol  Hyperglycemia -CBG's -SSI -Follow ICU Hypo/Hyperglycemia protocol  Diminished pulses: Noted to have pulsatile on bedside ultrasound of DP and femoral. Difficulty with Doppler -Needs formal US of lower extremities. However [place] unable to be attained for fear of false negatives while on the ventilator -If family wants to pursue aggressive measures patient should get CTA lower extremities if still faint to absent pulses in AM once rewarmed -Lactic acid now    Best practice (right click and "Reselect all SmartList Selections" daily)  Diet:  NPO Pain/Anxiety/Delirium protocol (if indicated): Yes (RASS goal -1) VAP protocol (if indicated): Yes DVT prophylaxis: Systemic AC GI prophylaxis: PPI Glucose control:  SSI Yes Central venous access:  N/A Arterial line:  N/A Foley:  Yes, and it is still needed Mobility:  bed rest  PT consulted: N/A Last date of multidisciplinary goals of care discussion [5/6] Confirmed full code status. Spoke with daughter [place] Sink on 07/10/20 and she thinks patient would want to live at all costs. She thinks he would have been okay with trach and PEG if he should need it.  Code Status:  full code Disposition: ICU  The patient is critically ill with multiple organ  systems failure and requires high complexity decision making for assessment and support, frequent evaluation and titration of therapies, application of advanced monitoring technologies and extensive interpretation of multiple databases.  Independent Critical Care Time: 45 minutes  Vertis Kelch DO Internal Medicine/Pediatrics Pulmonary and Critical Care Fellow PGY-7    Please see Amion for pager number to reach on-call Pulmonary and Critical Care Team.

## 2020-07-10 NOTE — Consult Note (Signed)
ANTICOAGULATION CONSULT NOTE   Pharmacy Consult for heparin Indication: chest pain/ACS  Not on File  Patient Measurements: Height: 5\' 10"  (177.8 cm) Weight: 78.4 kg (172 lb 13.5 oz) IBW/kg (Calculated) : 73 Heparin Dosing Weight: 78.4 kg  Vital Signs: Temp: 98.1 F (36.7 C) (05/06 1300) Temp Source: Bladder (05/06 1300) BP: 100/75 (05/06 1300) Pulse Rate: 79 (05/06 1300)  Labs: Recent Labs    07/08/20 1806 07/08/20 1806 07/08/20 1846 07/08/20 2041 07/08/20 2159 07/09/20 0122 07/09/20 0124 07/09/20 0617 07/09/20 0848 07/09/20 2135 07/10/20 0025 07/10/20 0226 07/10/20 0337 07/10/20 0614 07/10/20 0812 07/10/20 1213  HGB 15.4  --   --   --   --   --  16.3  --   --   --    < > 19.7* 16.1 19.4*  --   --   HCT 53.3*  --   --   --   --   --  56.5*  --   --   --    < > 58.0* 55.0* 57.0*  --   --   PLT 611*  --   --   --   --   --  598*  --   --   --   --   --  683*  --   --   --   APTT  --    < > 40*  --   --  >200*  --   --   --  63*  --   --  161*  --   --  50*  LABPROT  --   --  18.7*  --   --  20.0*  --   --   --   --   --   --   --   --   --   --   INR  --   --  1.6*  --   --  1.7*  --   --   --   --   --   --   --   --   --   --   HEPARINUNFRC  --   --   --   --   --   --  >1.10*  --   --   --   --   --  >1.10*  --   --   --   CREATININE 1.62*  --   --   --  1.68* 1.44*  --  1.51*   < > 0.94   < > 0.90 1.05 0.90 1.35* 1.20  TROPONINIHS 135*  --   --  1,247* 1,863*  --   --  1,367*  --   --   --   --   --   --   --   --    < > = values in this interval not displayed.    Estimated Creatinine Clearance: 60.8 mL/min (by C-G formula based on SCr of 1.2 mg/dL).   Medical History: No past medical history on file.  Medications:  Pt on Eliquis 5gm BID PTA.  Assessment: Travis Maxwell is a 70 y.o. male who presents to the ER with CPR in progress.  Patient reportedly was having chest pain witnessed arrest. Patient arrives with spontaneous respirations and ROSC.  Pharmacy has been consulted for heparin dosing for ACS.  Hgb: 15.4 and plts: 611 Heparin Dosing Weight: 78.4 kg   Goal of Therapy:  Heparin level 0.3-0.7 units/ml once correlating with aPTT Monitor  platelets by anticoagulation protocol: Yes  Goal aPTT 66-102  05/05 0124 HL >1.10, aPTT >200, INR 1.7 05/05 2135 aPTT 63 05/06 0337 aPTT 161, HL >1.10; Per RN, lab drawn from central line, with Heparin running in PIV line 05/06 1213 aPTT 50   Plan:   aPTT subtherapeutic at 50, will not bolus since pt has been supratherapeutic twice, will increase heparin infusion to to 750 units/hr   Will recheck aPTT in 6 hours.  Due to pt being on DOAC PTA, will follow aPTT until correlation with HL.  Daily HL and CBC while on heparin.  [name], PharmD Pharmacy Resident  07/10/2020 1:45 PM

## 2020-07-10 NOTE — Procedures (Signed)
Patient Name: Travis Maxwell  MRN: [id number]  Epilepsy Attending: Charlsie Quest  Referring Physician/Provider: Dr. Brooke Dare Date: 07/10/2020  Duration: 43.47 mins  Patient history: 70 year old male status post V. fib arrest.  EEG to evaluate seizures.  Level of alertness: Comatose  AEDs during EEG study: Propofol, Versed, LEV  Technical aspects: This EEG study was done with scalp electrodes positioned according to the 10-20 International system of electrode placement. Electrical activity was acquired at a sampling rate of 500Hz  and reviewed with a high frequency filter of 70Hz  and a low frequency filter of 1Hz . EEG data were recorded continuously and digitally stored.   Description: EEG initially showed continuous generalized 3 to 5 Hz theta-delta slowing. After propofol and Versed was stopped, EEG showed generalized periodic epileptiform discharges with overriding fast activity at 0.75-1.5Hz . EEG gradually worsened and showed generalized bursts of highly epileptiform discharges lasting 0.5 to 3 seconds alternating with brief 1 to 2-second periods of generalized background attenuation. As propofol and Versed was restarted, EEG again showed continuous generalized sharply contoured 3 to 5 Hz theta - delta slowing. Hyperventilation and photic stimulation were not performed.     ABNORMALITY -Burst attenuation with highly epileptiform discharges, generalized -Continuous slow, generalized  IMPRESSION: This study showed evidence of generalized epileptogenicity with high potential for seizures as well as profound diffuse encephalopathy. In the setting of cardiac arrest, this EEG is suggestive of anoxic/hypoxic brain injury.  Dr was notified.  Travis Maxwell 

## 2020-07-10 NOTE — Progress Notes (Addendum)
NAME:  Travis Maxwell, MRN:  [id number], DOB:  03/03/1951, LOS: 2 ADMISSION DATE:  07/08/2020, CONSULTATION DATE:  07/08/2020 REFERRING MD:  Dr. Roxan Hockey, CHIEF COMPLAINT:  Cardiac Arrest   History of Present Illness:  70 year old male  arrived to the ED s/p out of hospital V-fib Cardiac arrest. Patient had complained of feeling hot with chest discomfort and found unresponsive by brother who started CPR. EMS arrived and patient reported in V.fib s/p shock x 2 and epi x 2, amiodarone and ROSC was achieved. In the ED patient had ventricular arrhythmia requiring shock x 2. Cardiology evaluated the patient, no emergent plans for cardiac cath at this time per report. PCCM admitted.   Of note, patient has prior hospitalization for cardiac arrest in 02/2020 for cardiogenic shock witth VF/VT. Patient with prolonged QTc in setting of olanzapine (unable to be discontinued).  Pertinent  Medical History  HTN, T2DM, HLD, GERD, CAD (s/p PC toRCAI), AVR with bioprosthetic valve, systolic CHF with EF of 20%, and paroxysmal A. Fib  Significant Hospital Events: Including procedures, antibiotic start and stop dates in addition to other pertinent events   . 5/4 Admit to ICU for cardiac arrest. Cooled and paralyzed . 5/5 Paralytics discontinued. EEG suggestive of anoxic brain injury with myoclonic seizure and generalized epileptogenicity. Central line placed for IV access . 5/6 rewarming started at midnight  Interim History / Subjective:  On hypothermia protocol, sedated and paralyzed. Patient with subtle jerking movemnts when paralytics and sedation weaned. EEG with myoclonic seizures and generalized epileptogenicity suggestive of anoxic brain injury. Objective   Blood pressure 90/69, pulse 71, temperature (!) 94.8 F (34.9 C), temperature source Bladder, resp. rate (!) 22, height 5\' 10"  (1.778 m), weight 78.4 kg, SpO2 96 %. CVP:  [6 mmHg-11 mmHg] 7 mmHg  Vent Mode: PRVC FiO2 (%):  [35 %-50 %] 35 % Set Rate:   [22 bmp-124 bmp] 124 bmp Vt Set:  [500 mL] 500 mL PEEP:  [8 cmH20] 8 cmH20 Plateau Pressure:  [19 cmH20] 19 cmH20   Intake/Output Summary (Last 24 hours) at 07/10/2020 0826 Last data filed at 07/10/2020 0700 Gross per 24 hour  Intake 1595.97 ml  Output 1775 ml  Net -179.03 ml   Filed Weights   07/08/20 1833  Weight: 78.4 kg   Physical Exam: General: Critically ill-appearing, no acute distress, artic sun in place HENT: [place], AT, ETT in place Eyes: EOMI, no scleral icterus Respiratory: Clear to auscultation bilaterally.  No crackles, wheezing or rales Cardiovascular: Bradycardic, RR, -M/R/G, no JVD GI: BS+, soft, nontender Extremities:-Edema,-tenderness. Poor pulses. Able to ultrasound and see pulsatility  but not picked up by dopler. Will put in for formal 09/07/20 likely some PAD Neuro: Unresponsive off sedation, pupils 3 mm, nonreactive, no withdrawal  Labs/imaging that I havepersonally reviewed  (right click and "Reselect all SmartList Selections" daily)   ABG 7.29/27/110 Peak trop 1863 LA 2.3, improved WBC 31, improved  UDS + cannibis Resolved Hospital Problem list     Assessment & Plan:    V-fib Cardiac Arrest Peak trop 1863 -Continuous telemetry -Serial EKGs -Maintain MAP greater than 65. No pressors currently required -Cardiology consulted, appreciate input ~no plans for emergent cardiac cath at this time. Continue heparin drip and amio infusion per cardiology -Heparin drip -Trend lactic acid -F/u echo  Acute hypoxic respiratory failure in the setting cardiac arrest -Full vent support, implement lung protective strategies -Wean FiO2 and PEEP as tolerated to maintain O2 sats greater than 92% -Reviewed ABG. Increased  RR  -Follow intermittent chest x-ray and ABG as needed -Implement VAP bundle -As needed bronchodilators -WUA/SBT daily. However mental status precludes extubation  Anoxic brain injury Seizure -CT head neg. EEG with seizures and epileptic  activity -Versed gtt, propofol gtt, keppra -Neurology consulted. Appreciate input  Leukocytosis - improving, likely secondary to arrest -Monitor fever curve -Trend WBCs -F/u cultures -Not on abx currenlty  Acute kidney injury - resolved -Monitor I&O's / urinary output -Follow BMP -Ensure adequate renal perfusion -Avoid nephrotoxic agents as able -Replace electrolytes as indicated  Mildly Elevated LFTs -Trend LFTs -Check serum Tylenol  Hyperglycemia -CBG's -SSI -Follow ICU Hypo/Hyperglycemia protocol  Diminished pulses: Noted to have pulsatile on bedside ultrasound of DP and femoral. Difficulty with Doppler -Needs formal US of lower extremities. However [place] unable to be attained for fear of false negatives while on the ventilator -If family wants to pursue aggressive measures patient should get CTA lower extremities if still faint to absent pulses in AM once rewarmed -Lactic acid now    Best practice (right click and "Reselect all SmartList Selections" daily)  Diet:  NPO Pain/Anxiety/Delirium protocol (if indicated): Yes (RASS goal -1) VAP protocol (if indicated): Yes DVT prophylaxis: Systemic AC GI prophylaxis: PPI Glucose control:  SSI Yes Central venous access:  N/A Arterial line:  N/A Foley:  Yes, and it is still needed Mobility:  bed rest  PT consulted: N/A Last date of multidisciplinary goals of care discussion [5/6] Confirmed full code status. Spoke with daughter Vining Sink on 07/10/20 and she thinks patient would want to live at all costs. She thinks he would have been okay with trach and PEG if he should need it.  Code Status:  full code Disposition: ICU  The patient is critically ill with multiple organ systems failure and requires high complexity decision making for assessment and support, frequent evaluation and titration of therapies, application of advanced monitoring technologies and extensive interpretation of multiple databases.  Independent Critical Care  Time: 45 minutes  Vertis Kelch DO Internal Medicine/Pediatrics Pulmonary and Critical Care Fellow PGY-7    Please see Amion for pager number to reach on-call Pulmonary and Critical Care Team.

## 2020-07-10 NOTE — Progress Notes (Signed)
Brief Nutrition Note  Discussed in IDT rounds, pt being warmed today and OGT in place. On pressor support x 1 but MAP being maintained >65. Will start feeds at 62mL/h and advance slowly to continue to allow pt to be warmed and for hemodynamic stability. Low dose of propofol infusing  Initiate tube feeding via OGT: - Vital AF at 34mL/h. Advance by 28mL q12h to goal of 60 ml/h (1440 ml per day) - Prosource TF 45 ml BID - Free water flushes 18ml q4 hours to maintain tube patency  - Provides 1808 kcal, 130 gm protein, 1348 ml free water daily  Propofol at 4.77mL/h provides 124kcal/d  Goal TF regimen and propofol at current rate providing 1932 total kcal/day (104% of kcal needs)  Greig Castilla, RD, LDN Clinical Dietitian Pager on Amion

## 2020-07-10 NOTE — Progress Notes (Signed)
Patient Name: Travis Maxwell Date of Encounter: 07/10/2020  Hospital Problem List     Active Problems:   Cardiac arrest [facility])   Malnutrition of moderate degree    Patient Profile     70 y.o. male with a past medical history significant for coronary artery disease (proximal RCA stent 2019), congestive heart failure (EF 20 to 25% 01/21/2020) hypertension, diabetes, aortic valve replacement, paroxysmal atrial fibrillation with RVR (on Eliquis), mood disorder (on olanzapine), ongoing tobacco abuse, malnutrition admitted after VF arrest both in the field and in the ER.  Was admitted in December of last year with a similar admission.  Was on amiodarone both IV along with IV lidocaine during that admission for CHF 2018.  QT was prolonged and remains prolonged however recommendation by EP was to persist with amiodarone despite this.  Patient remains in sinus rhythm with prolonged QT on IV amiodarone drip.  Has had no breakthrough arrhythmias.  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  Patient is currently unresponsive with probable anoxic encephalopathy.  Neurology is involved.  Echocardiogram read during this admission showed EF of 20 to 25% with global hypokinesis.  Did not appear to have critical aortic stenosis.  No pericardial effusion.  Subjective   Patient unresponsive intubated status postcardiac arrest on code ice protocol.  Not able to give history.  Inpatient Medications    . artificial tears  1 application Both Eyes Q0H  . chlorhexidine gluconate (MEDLINE KIT)  15 mL Mouth Rinse BID  . Chlorhexidine Gluconate Cloth  6 each Topical Daily  . insulin aspart  0-20 Units Subcutaneous Q4H  . mouth rinse  15 mL Mouth Rinse 10 times per day  . pantoprazole (PROTONIX) IV  40 mg Intravenous QHS     Vital Signs    Vitals:   07/10/20 0630 07/10/20 0700 07/10/20 0800 07/10/20 0818  BP: 109/75 90/69 (!) 87/67   Pulse: 69 71 71   Resp:  (!) 22    Temp:  (!) 94.8 F (34.9 C) (!) 95.2 F (35.1 C)   TempSrc:  Bladder    SpO2: 99% 100% 96% 96%  Weight:      Height:        Intake/Output Summary (Last 24 hours) at 07/10/2020 0829 Last data filed at 07/10/2020 0700 Gross per 24 hour  Intake 1583.83 ml  Output 1775 ml  Net -191.17 ml   Filed Weights   07/08/20 1833  Weight: 78.4 kg    Physical Exam    GEN: Well nourished, well developed, in no acute distress.  HEENT: normal.  Neck: Supple, no JVD, carotid bruits, or masses. Cardiac: RRR, no murmurs, rubs, or gallops. No clubbing, cyanosis, edema.  Radials/DP/PT 2+ and equal bilaterally.  Respiratory:  Respirations regular and unlabored, clear to auscultation bilaterally. GI: Soft, nontender, nondistended, BS + x 4. MS: no deformity or atrophy. Skin: warm and dry, no rash. Neuro:  Strength and sensation are intact. Psych: Normal affect.  Labs    CBC Recent Labs    07/09/20 0124 07/10/20 0025 07/10/20 0337 07/10/20 0614  WBC 31.6*  --  16.7*  --   HGB 16.3   < > 16.1 19.4*  HCT 56.5*   < > 55.0* 57.0*  MCV 69.3*  --  66.7*  --   PLT 598*  --  683*  --    < > = values in this interval not displayed.   Basic Metabolic Panel Recent Labs    07/09/20 2135 07/10/20 0025 07/10/20 0337 07/10/20 0614  NA 138   < > 135 140  K 3.9   < > 4.3 4.2  CL 112*   < > 112* 113*  CO2 17*  --  17*  --   GLUCOSE 218*   < > 196* 176*  BUN 26*   < > 25* 26*  CREATININE 0.94   < > 1.05 0.90  CALCIUM 8.6*  --  8.1*  --   MG 2.2  --  2.0  --    < > = values in this interval not displayed.   Liver Function Tests Recent Labs    07/08/20 1806  AST 97*  ALT 79*  ALKPHOS 95  BILITOT 0.7  PROT 7.5  ALBUMIN 3.7   No results for input(s): LIPASE, AMYLASE in the last 72 hours. Cardiac Enzymes No results for input(s):  CKTOTAL, CKMB, CKMBINDEX, TROPONINI in the last 72 hours. BNP No results for input(s): BNP in the last 72 hours. D-Dimer No results for input(s): DDIMER in the last 72 hours. Hemoglobin A1C Recent Labs    07/08/20 2159  HGBA1C 6.2*   Fasting Lipid Panel No results for input(s): CHOL, HDL, LDLCALC, TRIG, CHOLHDL, LDLDIRECT in the last 72 hours. Thyroid Function Tests No results for input(s): TSH, T4TOTAL, T3FREE, THYROIDAB in the last 72 hours.  Invalid input(s): FREET3  Telemetry    Sinus rhythm with prolonged QT ECG    Sinus rhythm with prolonged QTC of 628 ms  Radiology    DG Abd 1 View  Result Date: 07/09/2020 CLINICAL DATA:  NG tube placement. EXAM: ABDOMEN - 1 VIEW COMPARISON:  07/08/2020. FINDINGS: NG tube noted with tip over the stomach. Gastric distention noted. Debris in the stomach may be present. Small-bowel distention also noted. Colonic gas pattern is normal. No free air. No acute bony abnormality. IMPRESSION: NG tube noted with tip over the stomach. Moderate gastric distention. Debris may be present in the stomach. Small-bowel distention is also noted. Follow-up abdominal series suggested to demonstrate resolution of gastric and small bowel distention. Electronically Signed   By: Marcello Moores  Register   On: 07/09/2020 16:19   DG Abd 1 View  Result Date: 07/08/2020 CLINICAL DATA:  Orogastric tube placement EXAM: ABDOMEN - 1 VIEW COMPARISON:  02/02/2020 FINDINGS: Nasogastric tube is in place with its tip within the expected proximal body of the stomach and its proximal side hole in the expected location of the gastroesophageal junction. There is mild gaseous distension of the stomach. Thel pelvis is excluded from view. 8 mm stable calcification overlies the right kidney. Mild cardiomegaly is stable. Aortic valve replacement has been performed. IMPRESSION: Nasogastric tube just within the proximal body of the stomach. Advancement by 5-10 cm may better position the catheter within  the gastric lumen. Electronically Signed   By: Fidela Salisbury MD   On: 07/08/2020 23:42   CT Head Wo Contrast  Result Date: 07/08/2020 CLINICAL DATA:  Altered mental status EXAM: CT HEAD WITHOUT CONTRAST TECHNIQUE: Contiguous axial images were obtained from the base of the skull through the vertex without intravenous contrast. COMPARISON:  02/13/2020 FINDINGS: Brain: Normal anatomic configuration. Parenchymal volume loss is commensurate with the patient's age. Mild periventricular white matter changes are present likely reflecting the sequela of small vessel ischemia. Right parietal encephalomalacia is unchanged. 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 No abnormal intra or extra-axial mass lesion or fluid collection. No abnormal mass effect or midline shift. No evidence of acute intracranial hemorrhage or infarct. Ventricular size is normal. Cerebellum unremarkable. Vascular: No asymmetric hyperdense vasculature at the skull base. Skull: Intact Sinuses/Orbits: Paranasal sinuses are clear. Orbits are unremarkable. Other: Mastoid air cells and middle ear cavities are clear. IMPRESSION: No acute intracranial abnormality. Stable senescent changes and remote infarcts. Electronically Signed   By: Fidela Salisbury MD   On: 07/08/2020 20:20   US Venous Img Lower Bilateral (DVT)  Result Date: 07/10/2020 CLINICAL DATA:  Bilateral lower extremity edema. EXAM: BILATERAL LOWER EXTREMITY VENOUS DOPPLER ULTRASOUND TECHNIQUE: Gray-scale sonography with graded compression, as well as color Doppler and duplex ultrasound were performed to evaluate the lower extremity deep venous systems from the level of the common femoral vein and including the common femoral, femoral, profunda femoral, popliteal and calf veins including the posterior tibial, peroneal and gastrocnemius veins when visible. The superficial great saphenous vein was also interrogated. Spectral Doppler was  utilized to evaluate flow at rest and with distal augmentation maneuvers in the common femoral, femoral and popliteal veins. COMPARISON:  None. FINDINGS: RIGHT LOWER EXTREMITY Common Femoral Vein: No evidence of thrombus. Normal compressibility, respiratory phasicity and response to augmentation. Saphenofemoral Junction: No evidence of thrombus. Normal compressibility and flow on color Doppler imaging. Profunda Femoral Vein: No evidence of thrombus. Normal compressibility and flow on color Doppler imaging. Femoral Vein: No evidence of thrombus. Normal compressibility, respiratory phasicity and response to augmentation. Popliteal Vein: No evidence of thrombus. Normal compressibility, respiratory phasicity and response to augmentation. Calf Veins: No evidence of thrombus. Normal compressibility and flow on color Doppler imaging. Superficial Great Saphenous Vein: No evidence of thrombus. Normal compressibility. Venous Reflux:  None. Other Findings: No evidence of superficial thrombophlebitis or abnormal fluid collection. LEFT LOWER EXTREMITY Common Femoral Vein: No evidence of thrombus. Normal compressibility, respiratory phasicity and response to augmentation. Saphenofemoral Junction: No evidence of thrombus. Normal compressibility and flow on color Doppler imaging. Profunda Femoral Vein: No evidence of thrombus. Normal compressibility and flow on color Doppler imaging. Femoral Vein: No evidence of thrombus. Normal compressibility, respiratory phasicity and response to augmentation. Popliteal Vein: No evidence of thrombus. Normal compressibility, respiratory phasicity and response to augmentation. Calf Veins: No evidence of thrombus. Normal compressibility and flow on color Doppler imaging. Superficial Great Saphenous Vein: No evidence of thrombus. Normal compressibility. Venous Reflux:  None. Other Findings: No evidence of superficial thrombophlebitis or abnormal fluid collection. IMPRESSION: No evidence of deep  venous thrombosis in either lower extremity. Electronically Signed   By: Aletta Edouard M.D.   On: 07/10/2020 08:01   DG Chest Port 1 View  Result Date: 07/09/2020 CLINICAL DATA:  Central venous catheter placement EXAM: PORTABLE CHEST 1 VIEW COMPARISON:  07/08/2020 FINDINGS: Right internal jugular central venous catheter has been placed with its tip overlying the superior vena cava. Endotracheal tube is seen 4.8 cm above the carina. Nasogastric tube extends into the upper abdomen beyond the margin of the examination. Mild perihilar interstitial pulmonary edema, asymmetrically more severe within the left perihilar region, is again seen in keeping with probable trace perihilar pulmonary edema. No pneumothorax or pleural effusion. Mild cardiomegaly is stable. Aortic valve replacement has been performed. 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 IMPRESSION: Right internal jugular central venous catheter tip within the superior vena cava. No pneumothorax. Otherwise stable lines and tubes. Trace bilateral perihilar  pulmonary edema, likely cardiogenic in nature. Stable cardiomegaly. Electronically Signed   By: Fidela Salisbury MD   On: 07/09/2020 15:59   DG Chest Portable 1 View  Result Date: 07/08/2020 CLINICAL DATA:  Status post CPR and intubation EXAM: PORTABLE CHEST 1 VIEW COMPARISON:  None. FINDINGS: Cardiac shadow is enlarged. Postsurgical changes are seen. Endotracheal tube is noted in satisfactory position. Gastric catheter is noted with the tip in the mid to distal esophagus. This should be advanced several cm deeper into the stomach. Central vascular congestion is noted. No focal infiltrate is seen. No acute bony abnormality is noted. IMPRESSION: Tubes and lines as described. Gastric catheter should be advanced several cm deeper into the stomach. Central vascular congestion consistent with the recent cardiac arrest. Electronically Signed   By: Inez Catalina M.D.   On: 07/08/2020  18:43   EEG adult  Result Date: 07/09/2020 Lora Havens, MD     07/09/2020  2:03 PM Patient Name: Carlos Heber MRN: [id number] Epilepsy Attending: Lora Havens Referring Physician/Provider: Dr. Margaretha Seeds Date: 07/09/2020 Duration: 33.41 mins Patient history: 70 year old male status post V. fib arrest.  EEG to evaluate seizures. Level of alertness: Comatose AEDs during EEG study: Propofol, Versed Technical aspects: This EEG study was done with scalp electrodes positioned according to the 10-20 International system of electrode placement. Electrical activity was acquired at a sampling rate of _0  and reviewed with a high frequency filter of _1  and a low frequency filter of _2 . EEG data were recorded continuously and digitally stored. Description: EEG initially showed continuous generalized background attenuation. Patient was also noted to have brief full body jerks every few seconds. Concomitant EEG showed generalized spike consistent with myoclonic seizure.  Propofol was started at around 1245 after which myoclonic seizures stopped.  he was administered at 1305 after which eeg showed continuous generalized background attenuation with generalized periodic discharges at 1 Hz.  EEG was reactive to tactile stimulation.  Hyperventilation and photic stimulation were not performed.   ABNORMALITY -Myoclonic seizures, generalized -Periodic discharges, generalized -Background attenuation, generalized IMPRESSION: This study initially showed myoclonic seizures every few seconds.  Propofol was started at around 1245 after which myoclonic seizures stopped and EEG was suggestive of generalized epileptogenicity  profound diffuse encephalopathy. These EEG findings are suggestive of anoxic/hypoxic brain injury. Dr Curly Shores was notified. Lora Havens   ECHOCARDIOGRAM COMPLETE  Result Date: 07/09/2020    ECHOCARDIOGRAM REPORT   Patient Name:   KELEN LAURA Date of Exam: 07/09/2020 Medical Rec #:  [id number]       Height:       70.0 in Accession #:    [id number]     Weight:       172.8 lb Date of Birth:  January 12, 1951      BSA:          1.962 m Patient Age:    80 years       BP:           103/75 mmHg Patient Gender: M              HR:           47 bpm. Exam Location:  [facility] Procedure: 2D Echo, Color Doppler and Cardiac Doppler Indications:     I46.9 Cardiac arrest  History:         Patient has no prior history of Echocardiogram examinations. No  medical Hx.  Sonographer:     Charmayne Sheer RDCS (AE) Referring Phys:  [id number] Bradly Bienenstock Diagnosing Phys: Ida Rogue MD  Sonographer Comments: Technically challenging study due to limited acoustic windows and echo performed with patient supine and on artificial respirator. IMPRESSIONS  1. Left ventricular ejection fraction, by estimation, is 20 to 25%. The left ventricle has severely decreased function. The left ventricle demonstrates global hypokinesis. The left ventricular internal cavity size was severely dilated. Left ventricular diastolic parameters are indeterminate.  2. Right ventricular systolic function is moderately reduced. The right ventricular size is normal.  3. Left atrial size was moderately dilated.  4. The aortic valve was not well visualized. Aortic valve regurgitation is not visualized. Unable to estimate gradient. FINDINGS  Left Ventricle: Left ventricular ejection fraction, by estimation, is 20 to 25%. The left ventricle has severely decreased function. The left ventricle demonstrates global hypokinesis. The left ventricular internal cavity size was severely dilated. There is no left ventricular hypertrophy. Left ventricular diastolic parameters are indeterminate. Right Ventricle: The right ventricular size is normal. No increase in right ventricular wall thickness. Right ventricular systolic function is moderately reduced. Left Atrium: Left atrial size was moderately dilated. Right Atrium: Right atrial size was normal in size. Pericardium:  There is no evidence of pericardial effusion. Mitral Valve: The mitral valve is normal in structure. No evidence of mitral valve regurgitation. No evidence of mitral valve stenosis. MV peak gradient, 2.2 mmHg. The mean mitral valve gradient is 1.0 mmHg. Tricuspid Valve: The tricuspid valve is normal in structure. Tricuspid valve regurgitation is not demonstrated. No evidence of tricuspid stenosis. Aortic Valve: The aortic valve was not well visualized. Aortic valve regurgitation is not visualized. Unabvle to exclude stenosis. Grossly Aortic valve mean gradient measures 7.0 mmHg. Aortic valve peak gradient measures 13.7 mmHg. Pulmonic Valve: The pulmonic valve was normal in structure. Pulmonic valve regurgitation is not visualized. No evidence of pulmonic stenosis. Aorta: The aortic root is normal in size and structure. Venous: The inferior vena cava is normal in size with greater than 50% respiratory variability, suggesting right atrial pressure of 3 mmHg. IAS/Shunts: No atrial level shunt detected by color flow Doppler.  LEFT VENTRICLE PLAX 2D LVIDd:         6.60 cm LVIDs:         6.30 cm LV PW:         1.60 cm LV IVS:        1.20 cm LVOT diam:     1.90 cm LV SV:         23 LV SV Index:   12 LVOT Area:     2.84 cm  LEFT ATRIUM           Index LA diam:      5.20 cm 2.65 cm/m LA Vol (A4C): 53.3 ml 27.17 ml/m  AORTIC VALVE AV Area (Vmax):    0.75 cm AV Area (Vmean):   0.73 cm AV Area (VTI):     0.74 cm AV Vmax:           185.00 cm/s AV Vmean:          120.000 cm/s AV VTI:            0.315 m AV Peak Grad:      13.7 mmHg AV Mean Grad:      7.0 mmHg LVOT Vmax:         49.00 cm/s LVOT Vmean:        30.800  cm/s LVOT VTI:          0.082 m LVOT/AV VTI ratio: 0.26  AORTA Ao Root diam: 2.50 cm MITRAL VALVE MV Area (PHT): 4.71 cm    SHUNTS MV Area VTI:   0.79 cm    Systemic VTI:  0.08 m MV Peak grad:  2.2 mmHg    Systemic Diam: 1.90 cm MV Mean grad:  1.0 mmHg MV Vmax:       0.75 m/s MV Vmean:      41.7 cm/s MV Decel  Time: 161 msec MV E velocity: 46.70 cm/s MV A velocity: 70.70 cm/s MV E/A ratio:  0.66 Ida Rogue MD Electronically signed by Ida Rogue MD Signature Date/Time: 07/09/2020/4:59:51 PM    Final     Assessment & Plan        1. VT arrest: -Presented in late November or early December of last year with VT VF arrest similar to this admission. He subsequently underwent left heart cath which showed nonobstructive CAD. Amiodarone was initially held due to concern for QT prolongation. However per EP at previous admission, it was recommended to be resumed.  Patient is back on an amiodarone drip.  This was transiently held due to QT prolongation as well as relative bradycardia however was restarted last p.m.  Did not appear to have any further VT.  Was seen by electrophysiology per [facility] during last admission andas per previous admission for intractable vt, will need to consider iv lidocaine -In his current state of probable anoxia and as per previous admission where he had persistent delerium,  he is not a candidate for LifeVest and ultimately may not be a good candidate for ICD  -Maintain potassium greater than 4.0 and magnesium greater than 2.0   2. CAD s/p PCI: -No symptoms concerning for angina -Cath previous admission 12/21 per Dr. Fletcher Anon with nonobstructive disease and patent stents.  No evidence of further ischemia.  Likely nonischemic cardiomyopathy.   3. Chronic combined systolic and diastolic CHF: -EF remains at 20% per echo per Dr. Rockey Situ.  No appreciable change from previous echo done during previous admission.  We will continue to follow for improvement of his probable anoxic encephalopathy and treat CHF as needed.  4. PAF: -Maintaining sinus rhythm -Continue amiodarone drip for now. -CHA2DS2-VASc 5   5.  Anoxic encephalopathy-appreciate neurology input.  We will follow their guidelines.  6.    Signed, Javier Docker. Symia Herdt MD 07/10/2020, 8:29 AM  Pager: [phone]  [phone]

## 2020-07-10 NOTE — Progress Notes (Signed)
Pt remains on vent and mx gtt's overnight (Versed, Prop, Heparin, Amio).   Phenobarb administered via IV infusion. Following phenobarb infusion, Map < 65. Levo restarted. Levo stopped @ 0120.  Foley remains in place.   Arctic sun remains in place, maintaining normothermic temp (37 deg c).   D10 stopped overnight.  No neuro changes overnight.

## 2020-07-10 NOTE — Progress Notes (Signed)
Patient has been unresponsive throughout the shift. Sedation turned off for EEG. Per ICU MD and neurology orders for propofol 20 mcg/kg/min and versed 5mg /hr. Per Dr. continue amiodarone 30 mg/h, aware of QTc up to 650 . Heparin drip infusing. Rewarming achieved at 13:55 pm. Tube feeds started per orders. Foley intact. Patient has orders to transfer to [facility]. Continue to assess.

## 2020-07-10 NOTE — Consult Note (Signed)
ANTICOAGULATION CONSULT NOTE   Pharmacy Consult for heparin Indication: chest pain/ACS  Not on File  Patient Measurements: Height: 5\' 10"  (177.8 cm) Weight: 78.4 kg (172 lb 13.5 oz) IBW/kg (Calculated) : 73 Heparin Dosing Weight: 78.4 kg  Vital Signs: Temp: 99 F (37.2 C) (05/06 2300) Temp Source: Bladder (05/06 2000) BP: 90/65 (05/06 2300) Pulse Rate: 86 (05/06 2300)  Labs: Recent Labs    07/08/20 1806 07/08/20 1806 07/08/20 1846 07/08/20 2041 07/08/20 2159 07/09/20 0122 07/09/20 0124 07/09/20 0617 07/09/20 0848 07/10/20 0226 07/10/20 0337 07/10/20 09/09/20 07/10/20 0812 07/10/20 1213 07/10/20 1622 07/10/20 2139  HGB 15.4  --   --   --   --   --  16.3  --    < > 19.7* 16.1 19.4*  --   --   --   --   HCT 53.3*  --   --   --   --   --  56.5*  --    < > 58.0* 55.0* 57.0*  --   --   --   --   PLT 611*  --   --   --   --   --  598*  --   --   --  683*  --   --   --   --   --   APTT  --    < > 40*  --   --  >200*  --   --    < >  --  161*  --   --  50*  --  79*  LABPROT  --   --  18.7*  --   --  20.0*  --   --   --   --   --   --   --   --   --   --   INR  --   --  1.6*  --   --  1.7*  --   --   --   --   --   --   --   --   --   --   HEPARINUNFRC  --   --   --   --   --   --  >1.10*  --   --   --  >1.10*  --   --   --   --   --   CREATININE 1.62*  --   --   --  1.68* 1.44*  --  1.51*   < > 0.90 1.05 0.90 1.35* 1.20 1.25*  --   TROPONINIHS 135*  --   --  1,247* 1,863*  --   --  1,367*  --   --   --   --   --   --   --   --    < > = values in this interval not displayed.    Estimated Creatinine Clearance: 58.4 mL/min (A) (by C-G formula based on SCr of 1.25 mg/dL (H)).   Medical History: No past medical history on file.  Medications:  Pt on Eliquis 5gm BID PTA.  Assessment: 06-20-1972 Doe is a 70 y.o. male who presents to the ER with CPR in progress.  Patient reportedly was having chest pain witnessed arrest. Patient arrives with spontaneous respirations and ROSC.  Pharmacy has been consulted for heparin dosing for ACS.  Hgb: 15.4 and plts: 611 Heparin Dosing Weight: 78.4 kg   Goal of Therapy:  Heparin level 0.3-0.7 units/ml once correlating with  aPTT Monitor platelets by anticoagulation protocol: Yes  Goal aPTT 66-102  05/05 0124 HL >1.10, aPTT >200, INR 1.7 05/05 2135 aPTT 63 05/06 0337 aPTT 161, HL >1.10; Per RN, lab drawn from central line, with Heparin running in PIV line 05/06 1213 aPTT 50 05/06 2139 aPTT 79, therapeutic x 1   Plan:   Will continue heparin infusion at 750 units/hr   Will recheck aPTT in ~6 hours w/ AM labs.  Due to pt being on DOAC PTA, will follow aPTT until correlation with HL.  Daily HL and CBC while on heparin.  Otelia Sergeant, PharmD, [facility] 07/10/2020 11:57 PM

## 2020-07-10 NOTE — Progress Notes (Signed)
Dr. Mikki Harbor into assess patient this morning. Notified MD that patients pupils are non-reactive, patient is unresponsive and unable to doppler lower extremity pulses bilaterally. Upper extremities are dopplered but very faint. Orders to place BMP's q4 verses q2. Cardiology aware of patients QTc levels being in the 640 range, continue amiodarone. Continue to assess.

## 2020-07-11 ENCOUNTER — Inpatient Hospital Stay: Payer: Medicare Other

## 2020-07-11 DIAGNOSIS — J9600 Acute respiratory failure, unspecified whether with hypoxia or hypercapnia: Secondary | ICD-10-CM

## 2020-07-11 LAB — CBC WITH DIFFERENTIAL/PLATELET
Abs Immature Granulocytes: 0.24 10*3/uL — ABNORMAL HIGH (ref 0.00–0.07)
Basophils Absolute: 0.1 10*3/uL (ref 0.0–0.1)
Basophils Relative: 1 %
Eosinophils Absolute: 0.7 10*3/uL — ABNORMAL HIGH (ref 0.0–0.5)
Eosinophils Relative: 4 %
HCT: 51.6 % (ref 39.0–52.0)
Hemoglobin: 15.7 g/dL (ref 13.0–17.0)
Immature Granulocytes: 1 %
Lymphocytes Relative: 8 %
Lymphs Abs: 1.3 10*3/uL (ref 0.7–4.0)
MCH: 20.1 pg — ABNORMAL LOW (ref 26.0–34.0)
MCHC: 30.4 g/dL (ref 30.0–36.0)
MCV: 65.9 fL — ABNORMAL LOW (ref 80.0–100.0)
Monocytes Absolute: 2.1 10*3/uL — ABNORMAL HIGH (ref 0.1–1.0)
Monocytes Relative: 12 %
Neutro Abs: 13 10*3/uL — ABNORMAL HIGH (ref 1.7–7.7)
Neutrophils Relative %: 74 %
Platelets: 545 10*3/uL — ABNORMAL HIGH (ref 150–400)
RBC: 7.83 MIL/uL — ABNORMAL HIGH (ref 4.22–5.81)
RDW: 26.4 % — ABNORMAL HIGH (ref 11.5–15.5)
WBC: 17.4 10*3/uL — ABNORMAL HIGH (ref 4.0–10.5)
nRBC: 0 % (ref 0.0–0.2)

## 2020-07-11 LAB — APTT
aPTT: 100 seconds — ABNORMAL HIGH (ref 24–36)
aPTT: 56 seconds — ABNORMAL HIGH (ref 24–36)
aPTT: 53 seconds — ABNORMAL HIGH (ref 24–36)

## 2020-07-11 LAB — LACTIC ACID, PLASMA: Lactic Acid, Venous: 1.3 mmol/L (ref 0.5–1.9)

## 2020-07-11 LAB — BLOOD GAS, ARTERIAL
Acid-base deficit: 8 mmol/L — ABNORMAL HIGH (ref 0.0–2.0)
Bicarbonate: 17.8 mmol/L — ABNORMAL LOW (ref 20.0–28.0)
FIO2: 0.35
MECHVT: 500 mL
Mechanical Rate: 24
O2 Saturation: 97.3 %
PEEP: 5 cmH2O
Patient temperature: 37
RATE: 24 resp/min
pCO2 arterial: 37 mmHg (ref 32.0–48.0)
pH, Arterial: 7.29 — ABNORMAL LOW (ref 7.350–7.450)
pO2, Arterial: 104 mmHg (ref 83.0–108.0)

## 2020-07-11 LAB — GLUCOSE, CAPILLARY
Glucose-Capillary: 161 mg/dL — ABNORMAL HIGH (ref 70–99)
Glucose-Capillary: 162 mg/dL — ABNORMAL HIGH (ref 70–99)
Glucose-Capillary: 186 mg/dL — ABNORMAL HIGH (ref 70–99)
Glucose-Capillary: 188 mg/dL — ABNORMAL HIGH (ref 70–99)
Glucose-Capillary: 158 mg/dL — ABNORMAL HIGH (ref 70–99)
Glucose-Capillary: 162 mg/dL — ABNORMAL HIGH (ref 70–99)
Glucose-Capillary: 152 mg/dL — ABNORMAL HIGH (ref 70–99)

## 2020-07-11 LAB — CULTURE, RESPIRATORY W GRAM STAIN: Culture: NORMAL

## 2020-07-11 LAB — BASIC METABOLIC PANEL
Anion gap: 7 (ref 5–15)
BUN: 25 mg/dL — ABNORMAL HIGH (ref 8–23)
CO2: 17 mmol/L — ABNORMAL LOW (ref 22–32)
Calcium: 7.9 mg/dL — ABNORMAL LOW (ref 8.9–10.3)
Chloride: 108 mmol/L (ref 98–111)
Creatinine, Ser: 1.29 mg/dL — ABNORMAL HIGH (ref 0.61–1.24)
GFR, Estimated: >60 mL/min (ref >60)
Glucose, Bld: 180 mg/dL — ABNORMAL HIGH (ref 70–99)
Potassium: 4.5 mmol/L (ref 3.5–5.1)
Sodium: 132 mmol/L — ABNORMAL LOW (ref 135–145)

## 2020-07-11 LAB — MAGNESIUM
Magnesium: 2 mg/dL (ref 1.7–2.4)
Magnesium: 2 mg/dL (ref 1.7–2.4)

## 2020-07-11 LAB — HEPARIN LEVEL (UNFRACTIONATED)
Heparin Unfractionated: 0.96 IU/mL — ABNORMAL HIGH (ref 0.30–0.70)
Heparin Unfractionated: 0.62 IU/mL (ref 0.30–0.70)

## 2020-07-11 LAB — PHOSPHORUS
Phosphorus: 4.1 mg/dL (ref 2.5–4.6)
Phosphorus: 3 mg/dL (ref 2.5–4.6)

## 2020-07-11 LAB — PHENOBARBITAL LEVEL: Phenobarbital: 10.8 ug/mL — ABNORMAL LOW (ref 15.0–30.0)

## 2020-07-11 MED ORDER — HEPARIN BOLUS VIA INFUSION
1000.0000 [IU] | Freq: Once | INTRAVENOUS | Status: AC
  Administered 2020-07-11: 1000 [IU] via INTRAVENOUS
  Filled 2020-07-11: qty 1000

## 2020-07-11 NOTE — Consult Note (Signed)
ANTICOAGULATION CONSULT NOTE   Pharmacy Consult for heparin Indication: chest pain/ACS  Not on File  Patient Measurements: Height: 5\' 10"  (177.8 cm) Weight: 78.4 kg (172 lb 13.5 oz) IBW/kg (Calculated) : 73 Heparin Dosing Weight: 78.4 kg  Vital Signs: Temp: 98.2 F (36.8 C) (05/07 1200) Temp Source: Bladder (05/07 1200) BP: 112/71 (05/07 1200) Pulse Rate: 91 (05/07 1200)  Labs: Recent Labs    07/08/20 1846 07/08/20 2041 07/08/20 2159 07/09/20 0122 07/09/20 0124 07/09/20 0617 07/09/20 0848 07/10/20 09/09/20 07/10/20 09/09/20 07/10/20 0812 07/10/20 1213 07/10/20 1622 07/10/20 2139 07/11/20 0445 07/11/20 1127  HGB  --   --   --   --  16.3  --    < > 16.1 19.4*  --   --   --   --  15.7  --   HCT  --   --   --   --  56.5*  --    < > 55.0* 57.0*  --   --   --   --  51.6  --   PLT  --   --   --   --  598*  --   --  683*  --   --   --   --   --  545*  --   APTT 40*  --   --  >200*  --   --    < > 161*  --   --  50*  --  79* 100* 56*  LABPROT 18.7*  --   --  20.0*  --   --   --   --   --   --   --   --   --   --   --   INR 1.6*  --   --  1.7*  --   --   --   --   --   --   --   --   --   --   --   HEPARINUNFRC  --   --   --   --  >1.10*  --   --  >1.10*  --   --   --   --   --  0.96*  --   CREATININE  --   --  1.68* 1.44*  --  1.51*   < > 1.05 0.90   < > 1.20 1.25*  --  1.29*  --   TROPONINIHS  --  1,247* 1,863*  --   --  1,367*  --   --   --   --   --   --   --   --   --    < > = values in this interval not displayed.    Estimated Creatinine Clearance: 56.6 mL/min (A) (by C-G formula based on SCr of 1.29 mg/dL (H)).   Medical History: No past medical history on file.  Medications:  Pt on Eliquis 5 mg BID PTA.  Assessment: Travis Maxwell is a 70 y.o. male who presents to the ER with CPR in progress.  Patient reportedly was having chest pain witnessed arrest. Patient arrives with spontaneous respirations and ROSC. Pharmacy has been consulted for heparin dosing for ACS.   Note: Patient with prolonged QTc in setting of olanzapine (unable to be discontinued). Hgb: 15.4 and plts: 611 Heparin Dosing Weight: 78.4 kg   Goal of Therapy:  Heparin level 0.3-0.7 units/ml once correlating with aPTT Monitor platelets by anticoagulation protocol: Yes  Goal aPTT 66-102  05/05 0124 HL >1.10, aPTT >200, INR 1.7 05/05 2135 aPTT 63 05/06 0337 aPTT 161, HL >1.10; Per RN, lab drawn from central line, with Heparin running in PIV line 05/06 1213 aPTT 50 05/06 2139 aPTT 79, therapeutic x 1 05/07 0445 aPTT 100, supratherapeutic, HL 0.96 05/07 1127 aPTT 56    Subtherapeutic, inc.rate to 700 unit/hr   Plan:  5/7/ APTT@1127 = 56  subtherapeutic  Will increase heparin infusion to 700 units/hr   Will recheck aPTT in 6 hours.  Due to pt being on DOAC PTA, will follow aPTT until correlation with HL.  Daily HL and CBC while on heparin.  Bari Mantis PharmD Clinical Pharmacist 07/11/2020

## 2020-07-11 NOTE — Consult Note (Addendum)
ANTICOAGULATION CONSULT NOTE   Pharmacy Consult for heparin Indication: chest pain/ACS  Not on File  Patient Measurements: Height: 5\' 10"  (177.8 cm) Weight: 78.4 kg (172 lb 13.5 oz) IBW/kg (Calculated) : 73 Heparin Dosing Weight: 78.4 kg  Vital Signs: Temp: 98.4 F (36.9 C) (05/07 1800) Temp Source: Bladder (05/07 1600) BP: 100/67 (05/07 1800) Pulse Rate: 87 (05/07 1800)  Labs: Recent Labs    07/08/20 2041 07/08/20 2159 07/08/20 2159 07/09/20 0122 07/09/20 0122 07/09/20 0124 07/09/20 0617 07/09/20 0848 07/10/20 09/09/20 07/10/20 09/09/20 07/10/20 0812 07/10/20 1213 07/10/20 1622 07/10/20 2139 07/11/20 0445 07/11/20 1127 07/11/20 1852  HGB  --   --   --   --   --  16.3  --    < > 16.1 19.4*  --   --   --   --  15.7  --   --   HCT  --   --   --   --   --  56.5*  --    < > 55.0* 57.0*  --   --   --   --  51.6  --   --   PLT  --   --   --   --   --  598*  --   --  683*  --   --   --   --   --  545*  --   --   APTT  --   --   --  >200*  --   --   --    < > 161*  --   --  50*  --  79* 100* 56*  --   LABPROT  --   --   --  20.0*  --   --   --   --   --   --   --   --   --   --   --   --   --   INR  --   --   --  1.7*  --   --   --   --   --   --   --   --   --   --   --   --   --   HEPARINUNFRC  --   --   --   --    < > >1.10*  --   --  >1.10*  --   --   --   --   --  0.96*  --  0.62  CREATININE  --  1.68*   < > 1.44*  --   --  1.51*   < > 1.05 0.90   < > 1.20 1.25*  --  1.29*  --   --   TROPONINIHS 1,247* 1,863*  --   --   --   --  1,367*  --   --   --   --   --   --   --   --   --   --    < > = values in this interval not displayed.    Estimated Creatinine Clearance: 56.6 mL/min (A) (by C-G formula based on SCr of 1.29 mg/dL (H)).   Medical History: No past medical history on file.  Medications:  Pt on Eliquis 5 mg BID PTA.  Assessment: Travis Maxwell is a 70 y.o. male who presents to the ER with CPR in progress.  Patient reportedly was having chest pain witnessed  arrest.  Patient arrives with spontaneous respirations and ROSC. Pharmacy has been consulted for heparin dosing for ACS.  Note: Patient with prolonged QTc in setting of olanzapine (unable to be discontinued). Hgb: 15.4 and plts: 611 Heparin Dosing Weight: 78.4 kg   Goal of Therapy:  Heparin level 0.3-0.7 units/ml once correlating with aPTT Monitor platelets by anticoagulation protocol: Yes  Goal aPTT 66-102  05/05 0124 HL >1.10, aPTT >200, INR 1.7 05/05 2135 aPTT 63 05/06 0337 aPTT 161, HL >1.10; Per RN, lab drawn from central line, with Heparin running in PIV line 05/06 1213 aPTT 50 05/06 2139 aPTT 79, therapeutic x 1 on 750units/hr  05/07 0445 aPTT 100, supratherapeutic, Decrease rate to 600 units/hr 05/07 1127 aPTT 56    Subtherapeutic, inc.rate to 700 unit/hr 05/07 1852 aPTT 53 Subtherapeutic.    Plan:  5/7 @ 1852 aPTT= 53. Level remains subtherapeutic.   Will order 1000 unit bolus   Will increase heparin infusion to 750 units/hr (Previously therapeutic at this dose)                               Will recheck aPTT in 6 hours.  Due to pt being on DOAC PTA, will follow aPTT until correlation with HL.  Daily HL and CBC while on heparin.  Gardner Candle, PharmD, BCPS Clinical Pharmacist 07/11/2020 8:09 PM

## 2020-07-11 NOTE — Consult Note (Signed)
ANTICOAGULATION CONSULT NOTE   Pharmacy Consult for heparin Indication: chest pain/ACS  Not on File  Patient Measurements: Height: 5\' 10"  (177.8 cm) Weight: 78.4 kg (172 lb 13.5 oz) IBW/kg (Calculated) : 73 Heparin Dosing Weight: 78.4 kg  Vital Signs: Temp: 98.6 F (37 C) (05/07 0500) Temp Source: Bladder (05/06 2000) BP: 109/70 (05/07 0500) Pulse Rate: 84 (05/07 0500)  Labs: Recent Labs    07/08/20 1846 07/08/20 2041 07/08/20 2159 07/09/20 0122 07/09/20 0124 07/09/20 0617 07/09/20 0848 07/10/20 09/09/20 07/10/20 09/09/20 07/10/20 0812 07/10/20 1213 07/10/20 1622 07/10/20 2139 07/11/20 0445  HGB  --   --   --   --  16.3  --    < > 16.1 19.4*  --   --   --   --  15.7  HCT  --   --   --   --  56.5*  --    < > 55.0* 57.0*  --   --   --   --  51.6  PLT  --   --   --   --  598*  --   --  683*  --   --   --   --   --  545*  APTT 40*  --   --  >200*  --   --    < > 161*  --   --  50*  --  79* 100*  LABPROT 18.7*  --   --  20.0*  --   --   --   --   --   --   --   --   --   --   INR 1.6*  --   --  1.7*  --   --   --   --   --   --   --   --   --   --   HEPARINUNFRC  --   --   --   --  >1.10*  --   --  >1.10*  --   --   --   --   --  0.96*  CREATININE  --   --  1.68* 1.44*  --  1.51*   < > 1.05 0.90   < > 1.20 1.25*  --  1.29*  TROPONINIHS  --  1,247* 1,863*  --   --  1,367*  --   --   --   --   --   --   --   --    < > = values in this interval not displayed.    Estimated Creatinine Clearance: 56.6 mL/min (A) (by C-G formula based on SCr of 1.29 mg/dL (H)).   Medical History: No past medical history on file.  Medications:  Pt on Eliquis 5gm BID PTA.  Assessment: Travis Maxwell is a 70 y.o. male who presents to the ER with CPR in progress.  Patient reportedly was having chest pain witnessed arrest. Patient arrives with spontaneous respirations and ROSC. Pharmacy has been consulted for heparin dosing for ACS.  Hgb: 15.4 and plts: 611 Heparin Dosing Weight: 78.4 kg    Goal of Therapy:  Heparin level 0.3-0.7 units/ml once correlating with aPTT Monitor platelets by anticoagulation protocol: Yes  Goal aPTT 66-102  05/05 0124 HL >1.10, aPTT >200, INR 1.7 05/05 2135 aPTT 63 05/06 0337 aPTT 161, HL >1.10; Per RN, lab drawn from central line, with Heparin running in PIV line 05/06 1213 aPTT 50 05/06 2139 aPTT 79, therapeutic  x 1 05/07 0445 aPTT 100, supratherapeutic, HL 0.96   Plan:   Will decrease heparin infusion to 600 units/hr   Will recheck aPTT in 6 hours.  Due to pt being on DOAC PTA, will follow aPTT until correlation with HL.  Daily HL and CBC while on heparin.  Otelia Sergeant, PharmD, [facility] 07/11/2020 6:04 AM

## 2020-07-11 NOTE — Progress Notes (Signed)
  Amiodarone Drug - Drug Interaction Consult Note  Recommendations: Pt not currently taking any interacting medications. If PTA meds (atorvastatin, furosemide, metoprolol) restarted, will need to continue monitoring vitals and monitor for myopathy.  Pt Qtc 628, cardiology aware of prolonged Qtc and EP recommending to persist with amiodarone despite this.  Patient with prolonged QTc in setting of olanzapine (unable to be discontinued).  Amiodarone is metabolized by the cytochrome P450 system and therefore has the potential to cause many drug interactions. Amiodarone has an average plasma half-life of 50 days (range 20 to 100 days).   There is potential for drug interactions to occur several weeks or months after stopping treatment and the onset of drug interactions may be slow after initiating amiodarone.   []  Statins: Increased risk of myopathy. Simvastatin- restrict dose to 20mg  daily. Other statins: counsel patients to report any muscle pain or weakness immediately.  []  Anticoagulants: Amiodarone can increase anticoagulant effect. Consider warfarin dose reduction. Patients should be monitored closely and the dose of anticoagulant altered accordingly, remembering that amiodarone levels take several weeks to stabilize.  []  Antiepileptics: Amiodarone can increase plasma concentration of phenytoin, the dose should be reduced. Note that small changes in phenytoin dose can result in large changes in levels. Monitor patient and counsel on signs of toxicity.  []  Beta blockers: increased risk of bradycardia, AV block and myocardial depression. Sotalol - avoid concomitant use.  []   Calcium channel blockers (diltiazem and verapamil): increased risk of bradycardia, AV block and myocardial depression.  []   Cyclosporine: Amiodarone increases levels of cyclosporine. Reduced dose of cyclosporine is recommended.  []  Digoxin dose should be halved when amiodarone is started.  []  Diuretics: increased risk  of cardiotoxicity if hypokalemia occurs.  []  Oral hypoglycemic agents (glyburide, glipizide, glimepiride): increased risk of hypoglycemia. Patient's glucose levels should be monitored closely when initiating amiodarone therapy.   []  Drugs that prolong the QT interval:  Torsades de pointes risk may be increased with concurrent use - avoid if possible.  Monitor QTc, also keep magnesium/potassium WNL if concurrent therapy can't be avoided. Antibiotics: e.g. fluoroquinolones, erythromycin. . Antiarrhythmics: e.g. quinidine, procainamide, disopyramide, sotalol. . Antipsychotics: e.g. phenothiazines, haloperidol.  . Lithium, tricyclic antidepressants, and methadone  Yona Stansbury A, PharmD 07/11/2020 12:38 PM

## 2020-07-11 NOTE — Progress Notes (Signed)
Patient Name: Travis Maxwell Date of Encounter: 07/11/2020  Hospital Problem List     Active Problems:   Cardiac arrest [facility])   Malnutrition of moderate degree    Patient Profile      70 y.o.malewith a past medical history significant for coronary artery disease (proximal RCA stent 2019), congestive heart failure (EF 20 to 25% 01/21/2020) hypertension, diabetes, aortic valve replacement, paroxysmal atrial fibrillation with RVR (on Eliquis), mood disorder (on olanzapine), ongoing tobacco abuse, malnutrition admitted after VF arrest both in the field and in the ER.  Was admitted in December of last year with a similar admission.  Was on amiodarone both IV along with IV lidocaine during that admission for CHF 2018.  QT was prolonged and remains prolonged however recommendation by EP was to persist with amiodarone despite this.  Patient remains in sinus rhythm with prolonged QT on IV amiodarone drip.  Has had no breakthrough arrhythmias.  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  Patient is currently unresponsive with probable anoxic encephalopathy.  Neurology is involved.  Echocardiogram read during this admission showed EF of 20 to 25% with global hypokinesis.  Did not appear to have critical aortic stenosis.  No pericardial effusion.   Subjective   Patient currently intubated sedated.  Off of pressors.  In sinus rhythm on IV amiodarone.  No purposeful response.  Does have some evidence of brainstem activity.  Inpatient Medications    . artificial tears  1 application Both Eyes [id number]  . chlorhexidine gluconate (MEDLINE KIT)  15 mL Mouth Rinse BID  . Chlorhexidine Gluconate Cloth  6 each Topical Daily  . feeding supplement (PROSource TF)  45 mL Per Tube BID  . free water  30 mL Per Tube Q4H  .  insulin aspart  0-20 Units Subcutaneous Q4H  . mouth rinse  15 mL Mouth Rinse 10 times per day  . pantoprazole (PROTONIX) IV  40 mg Intravenous QHS  . PHENObarbital  1 mg/kg/day Intravenous BID    Vital Signs    Vitals:   07/11/20 0500 07/11/20 0600 07/11/20 0700 07/11/20 0800  BP: 109/70 108/75 101/66 103/70  Pulse: 84 91 86 81  Resp:    (!) 24  Temp: 98.6 F (37 C) 99.1 F (37.3 C) 98.6 F (37 C) 98.2 F (36.8 C)  TempSrc:   Bladder Bladder  SpO2: 98% 97% 97% 97%  Weight:      Height:        Intake/Output Summary (Last 24 hours) at 07/11/2020 0927 Last data filed at 07/11/2020 0805 Gross per 24 hour  Intake 2561.26 ml  Output 1310 ml  Net 1251.26 ml   Filed Weights   07/08/20 1833  Weight: 78.4 kg    Physical Exam    GEN: Well nourished, well developed, in no acute distress.  HEENT: normal.  Neck: Supple, no JVD, carotid bruits, or masses. Cardiac: RRR, no murmurs, rubs, or gallops. No clubbing, cyanosis, edema.  Radials/DP/PT 2+ and equal bilaterally.  Respiratory:  Respirations regular and unlabored, clear to auscultation bilaterally. GI: Soft, nontender, nondistended, BS + x 4. MS: no deformity or atrophy. Skin: warm and dry, no rash. Neuro:  Strength and sensation are intact. Psych: Normal affect.  Labs    CBC Recent Labs    07/10/20 0337 07/10/20 2595 07/11/20  0445  WBC 16.7*  --  17.4*  NEUTROABS  --   --  13.0*  HGB 16.1 19.4* 15.7  HCT 55.0* 57.0* 51.6  MCV 66.7*  --  65.9*  PLT 683*  --  637*   Basic Metabolic Panel Recent Labs    07/10/20 0812 07/10/20 1213 07/10/20 1622 07/11/20 0445  NA 135   < > 136 132*  K 4.4   < > 4.6 4.5  CL 109   < > 111 108  CO2 17*   < > 16* 17*  GLUCOSE 191*   < > 130* 180*  BUN 26*   < > 24* 25*  CREATININE 1.35*   < > 1.25* 1.29*  CALCIUM 8.2*   < > 8.4* 7.9*  MG 1.9  --   --  2.0  PHOS 4.3  --   --  4.1   < > = values in this interval not displayed.   Liver Function Tests Recent Labs     07/10/20 0812 07/10/20 1622  AST 79* 77*  ALT 94* 100*  ALKPHOS 75 83  BILITOT 0.9 0.7  PROT 6.6 7.1  ALBUMIN 3.1* 3.4*   No results for input(s): LIPASE, AMYLASE in the last 72 hours. Cardiac Enzymes No results for input(s): CKTOTAL, CKMB, CKMBINDEX, TROPONINI in the last 72 hours. BNP No results for input(s): BNP in the last 72 hours. D-Dimer No results for input(s): DDIMER in the last 72 hours. Hemoglobin A1C Recent Labs    07/08/20 2159  HGBA1C 6.2*   Fasting Lipid Panel No results for input(s): CHOL, HDL, LDLCALC, TRIG, CHOLHDL, LDLDIRECT in the last 72 hours. Thyroid Function Tests No results for input(s): TSH, T4TOTAL, T3FREE, THYROIDAB in the last 72 hours.  Invalid input(s): FREET3  Telemetry    Normal sinus rhythm  ECG    Normal sinus rhythm  Radiology    DG Abd 1 View  Result Date: 07/09/2020 CLINICAL DATA:  NG tube placement. EXAM: ABDOMEN - 1 VIEW COMPARISON:  07/08/2020. FINDINGS: NG tube noted with tip over the stomach. Gastric distention noted. Debris in the stomach may be present. Small-bowel distention also noted. Colonic gas pattern is normal. No free air. No acute bony abnormality. IMPRESSION: NG tube noted with tip over the stomach. Moderate gastric distention. Debris may be present in the stomach. Small-bowel distention is also noted. Follow-up abdominal series suggested to demonstrate resolution of gastric and small bowel distention. Electronically Signed   By: Marcello Moores  Register   On: 07/09/2020 16:19   DG Abd 1 View  Result Date: 07/08/2020 CLINICAL DATA:  Orogastric tube placement EXAM: ABDOMEN - 1 VIEW COMPARISON:  02/02/2020 FINDINGS: Nasogastric tube is in place with its tip within the expected proximal body of the stomach and its proximal side hole in the expected location of the gastroesophageal junction. There is mild gaseous distension of the stomach. Thel pelvis is excluded from view. 8 mm stable calcification overlies the right kidney. Mild  cardiomegaly is stable. Aortic valve replacement has been performed. IMPRESSION: Nasogastric tube just within the proximal body of the stomach. Advancement by 5-10 cm may better position the catheter within the gastric lumen. Electronically Signed   By: Fidela Salisbury MD   On: 07/08/2020 23:42   CT Head Wo Contrast  Result Date: 07/08/2020 CLINICAL DATA:  Altered mental status EXAM: CT HEAD WITHOUT CONTRAST TECHNIQUE: Contiguous axial images were obtained from the base of the skull through the vertex without intravenous contrast. COMPARISON:  02/13/2020 FINDINGS: Brain:  Normal anatomic configuration. Parenchymal volume loss is commensurate with the patient's age. Mild periventricular white matter changes are present likely reflecting the sequela of small vessel ischemia. Right parietal encephalomalacia is unchanged. 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 No abnormal intra or extra-axial mass lesion or fluid collection. No abnormal mass effect or midline shift. No evidence of acute intracranial hemorrhage or infarct. Ventricular size is normal. Cerebellum unremarkable. Vascular: No asymmetric hyperdense vasculature at the skull base. Skull: Intact Sinuses/Orbits: Paranasal sinuses are clear. Orbits are unremarkable. Other: Mastoid air cells and middle ear cavities are clear. IMPRESSION: No acute intracranial abnormality. Stable senescent changes and remote infarcts. Electronically Signed   By: Fidela Salisbury MD   On: 07/08/2020 20:20   US Venous Img Lower Bilateral (DVT)  Result Date: 07/10/2020 CLINICAL DATA:  Bilateral lower extremity edema. EXAM: BILATERAL LOWER EXTREMITY VENOUS DOPPLER ULTRASOUND TECHNIQUE: Gray-scale sonography with graded compression, as well as color Doppler and duplex ultrasound were performed to evaluate the lower extremity deep venous systems from the level of the common femoral vein and including the common femoral, femoral,  profunda femoral, popliteal and calf veins including the posterior tibial, peroneal and gastrocnemius veins when visible. The superficial great saphenous vein was also interrogated. Spectral Doppler was utilized to evaluate flow at rest and with distal augmentation maneuvers in the common femoral, femoral and popliteal veins. COMPARISON:  None. FINDINGS: RIGHT LOWER EXTREMITY Common Femoral Vein: No evidence of thrombus. Normal compressibility, respiratory phasicity and response to augmentation. Saphenofemoral Junction: No evidence of thrombus. Normal compressibility and flow on color Doppler imaging. Profunda Femoral Vein: No evidence of thrombus. Normal compressibility and flow on color Doppler imaging. Femoral Vein: No evidence of thrombus. Normal compressibility, respiratory phasicity and response to augmentation. Popliteal Vein: No evidence of thrombus. Normal compressibility, respiratory phasicity and response to augmentation. Calf Veins: No evidence of thrombus. Normal compressibility and flow on color Doppler imaging. Superficial Great Saphenous Vein: No evidence of thrombus. Normal compressibility. Venous Reflux:  None. Other Findings: No evidence of superficial thrombophlebitis or abnormal fluid collection. LEFT LOWER EXTREMITY Common Femoral Vein: No evidence of thrombus. Normal compressibility, respiratory phasicity and response to augmentation. Saphenofemoral Junction: No evidence of thrombus. Normal compressibility and flow on color Doppler imaging. Profunda Femoral Vein: No evidence of thrombus. Normal compressibility and flow on color Doppler imaging. Femoral Vein: No evidence of thrombus. Normal compressibility, respiratory phasicity and response to augmentation. Popliteal Vein: No evidence of thrombus. Normal compressibility, respiratory phasicity and response to augmentation. Calf Veins: No evidence of thrombus. Normal compressibility and flow on color Doppler imaging. Superficial Great Saphenous  Vein: No evidence of thrombus. Normal compressibility. Venous Reflux:  None. Other Findings: No evidence of superficial thrombophlebitis or abnormal fluid collection. IMPRESSION: No evidence of deep venous thrombosis in either lower extremity. Electronically Signed   By: Aletta Edouard M.D.   On: 07/10/2020 08:01   DG Chest Port 1 View  Result Date: 07/11/2020 CLINICAL DATA:  Intubated EXAM: PORTABLE CHEST 1 VIEW COMPARISON:  07/09/2020 chest radiograph. FINDINGS: Endotracheal tube tip is 4.5 cm above the carina. Enteric tube terminates in proximal stomach. Right internal jugular central venous catheter terminates in middle third of the SVC. Intact sternotomy wires. Cardiac valvular prosthesis noted. Pacer pad overlies the left chest. Stable cardiomediastinal silhouette with mild cardiomegaly. No pneumothorax. No pleural effusion. Mild pulmonary edema, slightly improved. Right lung base atelectasis is similar. IMPRESSION: 1. Well-positioned support structures. 2. Mild congestive heart failure, slightly improved.  3. Stable right lung base atelectasis. Electronically Signed   By: Ilona Sorrel M.D.   On: 07/11/2020 07:55   DG Chest Port 1 View  Result Date: 07/09/2020 CLINICAL DATA:  Central venous catheter placement EXAM: PORTABLE CHEST 1 VIEW COMPARISON:  07/08/2020 FINDINGS: Right internal jugular central venous catheter has been placed with its tip overlying the superior vena cava. Endotracheal tube is seen 4.8 cm above the carina. Nasogastric tube extends into the upper abdomen beyond the margin of the examination. Mild perihilar interstitial pulmonary edema, asymmetrically more severe within the left perihilar region, is again seen in keeping with probable trace perihilar pulmonary edema. No pneumothorax or pleural effusion. Mild cardiomegaly is stable. Aortic valve replacement has been performed. 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 IMPRESSION: Right internal jugular  central venous catheter tip within the superior vena cava. No pneumothorax. Otherwise stable lines and tubes. Trace bilateral perihilar pulmonary edema, likely cardiogenic in nature. Stable cardiomegaly. Electronically Signed   By: Fidela Salisbury MD   On: 07/09/2020 15:59   DG Chest Portable 1 View  Result Date: 07/08/2020 CLINICAL DATA:  Status post CPR and intubation EXAM: PORTABLE CHEST 1 VIEW COMPARISON:  None. FINDINGS: Cardiac shadow is enlarged. Postsurgical changes are seen. Endotracheal tube is noted in satisfactory position. Gastric catheter is noted with the tip in the mid to distal esophagus. This should be advanced several cm deeper into the stomach. Central vascular congestion is noted. No focal infiltrate is seen. No acute bony abnormality is noted. IMPRESSION: Tubes and lines as described. Gastric catheter should be advanced several cm deeper into the stomach. Central vascular congestion consistent with the recent cardiac arrest. Electronically Signed   By: Inez Catalina M.D.   On: 07/08/2020 18:43   EEG adult  Result Date: 07/10/2020 Lora Havens, MD     07/10/2020  2:21 PM Patient Name: Philmore Lepore MRN: [id number] Epilepsy Attending: Lora Havens Referring Physician/Provider: Dr. Lesleigh Noe Date: 07/10/2020 Duration: 43.47 mins  Patient history: 70 year old male status post V. fib arrest.  EEG to evaluate seizures.  Level of alertness: Comatose  AEDs during EEG study: Propofol, Versed, LEV  Technical aspects: This EEG study was done with scalp electrodes positioned according to the 10-20 International system of electrode placement. Electrical activity was acquired at a sampling rate of 500Hz  and reviewed with a high frequency filter of 70Hz  and a low frequency filter of 1Hz . EEG data were recorded continuously and digitally stored.  Description: EEG initially showed continuous generalized 3 to 5 Hz theta-delta slowing. After propofol and Versed was stopped, EEG showed  generalized periodic epileptiform discharges with overriding fast activity at 0.75-1.5Hz . EEG gradually worsened and showed generalized bursts of highly epileptiform discharges lasting 0.5 to 3 seconds alternating with brief 1 to 2-second periods of generalized background attenuation. As propofol and Versed was restarted, EEG again showed continuous generalized sharply contoured 3 to 5 Hz theta - delta slowing. Hyperventilation and photic stimulation were not performed.    ABNORMALITY -Burst attenuation with highly epileptiform discharges, generalized -Continuous slow, generalized  IMPRESSION: This study showed evidence of generalized epileptogenicity with high potential for seizures as well as profound diffuse encephalopathy. In the setting of cardiac arrest, this EEG is suggestive of anoxic/hypoxic brain injury. Dr Curly Shores was notified.  Lora Havens   EEG adult  Result Date: 07/09/2020 Lora Havens, MD     07/09/2020  2:03 PM Patient Name: Keyvin Rison MRN: [id number] Epilepsy Attending: Lora Havens  Referring Physician/Provider: Dr. Margaretha Seeds Date: 07/09/2020 Duration: 33.41 mins Patient history: 70 year old male status post V. fib arrest.  EEG to evaluate seizures. Level of alertness: Comatose AEDs during EEG study: Propofol, Versed Technical aspects: This EEG study was done with scalp electrodes positioned according to the 10-20 International system of electrode placement. Electrical activity was acquired at a sampling rate of 500Hz  and reviewed with a high frequency filter of 70Hz  and a low frequency filter of 1Hz . EEG data were recorded continuously and digitally stored. Description: EEG initially showed continuous generalized background attenuation. Patient was also noted to have brief full body jerks every few seconds. Concomitant EEG showed generalized spike consistent with myoclonic seizure.  Propofol was started at around 1245 after which myoclonic seizures stopped.  he was  administered at 1305 after which eeg showed continuous generalized background attenuation with generalized periodic discharges at 1 Hz.  EEG was reactive to tactile stimulation.  Hyperventilation and photic stimulation were not performed.   ABNORMALITY -Myoclonic seizures, generalized -Periodic discharges, generalized -Background attenuation, generalized IMPRESSION: This study initially showed myoclonic seizures every few seconds.  Propofol was started at around 1245 after which myoclonic seizures stopped and EEG was suggestive of generalized epileptogenicity  profound diffuse encephalopathy. These EEG findings are suggestive of anoxic/hypoxic brain injury. Dr Curly Shores was notified. Lora Havens   ECHOCARDIOGRAM COMPLETE  Result Date: 07/09/2020    ECHOCARDIOGRAM REPORT   Patient Name:   TOBBY FAWCETT Date of Exam: 07/09/2020 Medical Rec #:  [id number]      Height:       70.0 in Accession #:    [id number]     Weight:       172.8 lb Date of Birth:  [birth year]      BSA:          1.962 m Patient Age:    18 years       BP:           103/75 mmHg Patient Gender: M              HR:           47 bpm. Exam Location:  [facility] Procedure: 2D Echo, Color Doppler and Cardiac Doppler Indications:     I46.9 Cardiac arrest  History:         Patient has no prior history of Echocardiogram examinations. No                  medical Hx.  Sonographer:     Charmayne Sheer RDCS (AE) Referring Phys:  [id number] Bradly Bienenstock Diagnosing Phys: Ida Rogue MD  Sonographer Comments: Technically challenging study due to limited acoustic windows and echo performed with patient supine and on artificial respirator. IMPRESSIONS  1. Left ventricular ejection fraction, by estimation, is 20 to 25%. The left ventricle has severely decreased function. The left ventricle demonstrates global hypokinesis. The left ventricular internal cavity size was severely dilated. Left ventricular diastolic parameters are indeterminate.  2. Right ventricular systolic  function is moderately reduced. The right ventricular size is normal.  3. Left atrial size was moderately dilated.  4. The aortic valve was not well visualized. Aortic valve regurgitation is not visualized. Unable to estimate gradient. FINDINGS  Left Ventricle: Left ventricular ejection fraction, by estimation, is 20 to 25%. The left ventricle has severely decreased function. The left ventricle demonstrates global hypokinesis. The left ventricular internal cavity size was severely dilated. There is no left ventricular hypertrophy. Left ventricular diastolic parameters  are indeterminate. Right Ventricle: The right ventricular size is normal. No increase in right ventricular wall thickness. Right ventricular systolic function is moderately reduced. Left Atrium: Left atrial size was moderately dilated. Right Atrium: Right atrial size was normal in size. Pericardium: There is no evidence of pericardial effusion. Mitral Valve: The mitral valve is normal in structure. No evidence of mitral valve regurgitation. No evidence of mitral valve stenosis. MV peak gradient, 2.2 mmHg. The mean mitral valve gradient is 1.0 mmHg. Tricuspid Valve: The tricuspid valve is normal in structure. Tricuspid valve regurgitation is not demonstrated. No evidence of tricuspid stenosis. Aortic Valve: The aortic valve was not well visualized. Aortic valve regurgitation is not visualized. Unabvle to exclude stenosis. Grossly Aortic valve mean gradient measures 7.0 mmHg. Aortic valve peak gradient measures 13.7 mmHg. Pulmonic Valve: The pulmonic valve was normal in structure. Pulmonic valve regurgitation is not visualized. No evidence of pulmonic stenosis. Aorta: The aortic root is normal in size and structure. Venous: The inferior vena cava is normal in size with greater than 50% respiratory variability, suggesting right atrial pressure of 3 mmHg. IAS/Shunts: No atrial level shunt detected by color flow Doppler.  LEFT VENTRICLE PLAX 2D LVIDd:          6.60 cm LVIDs:         6.30 cm LV PW:         1.60 cm LV IVS:        1.20 cm LVOT diam:     1.90 cm LV SV:         23 LV SV Index:   12 LVOT Area:     2.84 cm  LEFT ATRIUM           Index LA diam:      5.20 cm 2.65 cm/m LA Vol (A4C): 53.3 ml 27.17 ml/m  AORTIC VALVE AV Area (Vmax):    0.75 cm AV Area (Vmean):   0.73 cm AV Area (VTI):     0.74 cm AV Vmax:           185.00 cm/s AV Vmean:          120.000 cm/s AV VTI:            0.315 m AV Peak Grad:      13.7 mmHg AV Mean Grad:      7.0 mmHg LVOT Vmax:         49.00 cm/s LVOT Vmean:        30.800 cm/s LVOT VTI:          0.082 m LVOT/AV VTI ratio: 0.26  AORTA Ao Root diam: 2.50 cm MITRAL VALVE MV Area (PHT): 4.71 cm    SHUNTS MV Area VTI:   0.79 cm    Systemic VTI:  0.08 m MV Peak grad:  2.2 mmHg    Systemic Diam: 1.90 cm MV Mean grad:  1.0 mmHg MV Vmax:       0.75 m/s MV Vmean:      41.7 cm/s MV Decel Time: 161 msec MV E velocity: 46.70 cm/s MV A velocity: 70.70 cm/s MV E/A ratio:  0.66 Ida Rogue MD Electronically signed by Ida Rogue MD Signature Date/Time: 07/09/2020/4:59:51 PM    Final     Assessment & Plan       1. VT arrest: -Presented in late November or early December of last year with VT VF arrest similar to this admission. He subsequently underwent left heart cath which showed nonobstructive CAD. Amiodarone was initially held  due to concern for QT prolongation. However per EP at previous admission, it was recommended to be resumed.  Patient is back on an amiodarone drip.  This was transiently held due to QT prolongation as well as relative bradycardia however was restarted last p.m.  Did not appear to have any further VT.  Was seen by electrophysiology per [facility] during last admission andas per previous admission for intractable vt, will need to consider iv lidocaine -In his current state of probable anoxia and as per previous admission where he had persistent delerium,  he is not a candidate for LifeVest and ultimately may not  be a good candidate for ICD  -Maintain potassium greater than 4.0 and magnesium greater than 2.0   2. CAD s/p PCI: -No symptoms concerning for angina -Cath previous admission 12/21 per Dr. Fletcher Anon with nonobstructive disease and patent stents.  No evidence of further ischemia.  Likely nonischemic cardiomyopathy.   3. Chronic combined systolic and diastolic CHF: -EF remains at 20% per echo per Dr. Rockey Situ.  No appreciable change from previous echo done during previous admission.  We will continue to follow for improvement of his probable anoxic encephalopathy and treat CHF as needed.  4. PAF: -Maintaining sinus rhythm -Continue amiodarone drip for now. -CHA2DS2-VASc 5   5.  Anoxic encephalopathy-appreciate neurology input.  We will follow their guidelines.  Apparently has some evidence of brainstem activity but no purposeful response to noxious stimuli.  Very poor prognosis with multisystem failure.  We will continue to follow.  Not a candidate for further cardiac intervention.  Would continue with IV amiodarone for now.   Signed, Javier Docker Jacoby Zanni MD 07/11/2020, 9:27 AM  Pager: [phone]

## 2020-07-11 NOTE — Progress Notes (Signed)
Neurology Progress Note  Patient ID: Travis Maxwell is a 70 y.o. with PMHx of  has no past medical history on file.  Initially consulted for: S/p anoxic brain injury  Major interval events:  -hypotensive after phenobarb infusion, briefly required levophed  Subjective: -Unresponsive  Exam: Vitals:   07/11/20 0700 07/11/20 0800  BP: 101/66 103/70  Pulse: 86 81  Resp:  (!) 24  Temp: 98.6 F (37 C) 98.2 F (36.8 C)  SpO2: 97% 97%   Constitutional: Chronically ill appearing Psych: Unresponsive Eyes: No scleral injection or edema, ointment on eyes appropriate for his intubation HENT: ETT in place  MSK: no joint deformities.  Cardiovascular: Normal rate and regular rhythm.  Respiratory: Breathing over the vent when rate is turned down GI: Soft.  No distension.  Skin: Warm dry and intact visible skin  Neuro - with sedation held  for 20 minutes Mental Status: Today's does not have the intermittent blinking/jaw tremoring associated with prior seizure Cranial Nerves: II: No clear blink to threat, does not have frequent looking. Pupils are equal, round, and reactive to light, sluggish, 2.5 -> 2  mm.  Right pupil is slightly larger than the left III,IV, VI/VIII: EOM  absent today,  V/VII: No response to corneal stimulation VIII: No response to voice X/XI:  Gag remains absent but cough is present today  XII: Unable to assess tongue protrusion secondary to patient's mental status and intubation Motor/Sensory: Tone is low throughout. Bulk is normal.    No longer has myoclonic jerking of bilateral upper extremities with noxious stimulation.  No movement to noxious stimulation in all 4 extremities  Cerebellar: Unable to assess secondary to patient's mental status   Pertinent Labs: Creatinine 1.29  Lactate stable at 2 Leukocytosis improving/stable to 17.4   Basic Metabolic Panel: Recent Labs  Lab 07/08/20 1833 07/08/20 2159 07/09/20 2135 07/10/20 0025 07/10/20 0337  07/10/20 2956 07/10/20 0812 07/10/20 1213 07/10/20 1622 07/11/20 0445  NA  --    < > 138   < > 135 140 135 135 136 132*  K  --    < > 3.9   < > 4.3 4.2 4.4 4.5 4.6 4.5  CL  --    < > 112*   < > 112* 113* 109 110 111 108  CO2  --    < > 17*  --  17*  --  17* 17* 16* 17*  GLUCOSE  --    < > 218*   < > 196* 176* 191* 166* 130* 180*  BUN  --    < > 26*   < > 25* 26* 26* 24* 24* 25*  CREATININE  --    < > 0.94   < > 1.05 0.90 1.35* 1.20 1.25* 1.29*  CALCIUM  --    < > 8.6*  --  8.1*  --  8.2* 8.2* 8.4* 7.9*  MG 1.8  --  2.2  --  2.0  --  1.9  --   --  2.0  PHOS  --   --   --   --   --   --  4.3  --   --  4.1   < > = values in this interval not displayed.    CBC: Recent Labs  Lab 07/08/20 1806 07/09/20 0124 07/10/20 0025 07/10/20 0226 07/10/20 0337 07/10/20 0614 07/11/20 0445  WBC 38.5* 31.6*  --   --  16.7*  --  17.4*  NEUTROABS  --   --   --   --   --   --  13.0*  HGB 15.4 16.3 19.4* 19.7* 16.1 19.4* 15.7  HCT 53.3* 56.5* 57.0* 58.0* 55.0* 57.0* 51.6  MCV 69.0* 69.3*  --   --  66.7*  --  65.9*  PLT 611* 598*  --   --  683*  --  545*    Coagulation Studies: Recent Labs    07/08/20 1846 07/09/20 0122  LABPROT 18.7* 20.0*  INR 1.6* 1.7*    Description:EEG 5/6 (personally reviewed), agree with Dr. Melynda Ripple read: "Initially showed continuous generalized 3 to 5 Hz theta-delta slowing. After propofol and Versed was stopped, EEG showed generalized periodic epileptiform discharges with overriding fast activity at 0.75-1.5Hz . EEG gradually worsened and showed generalized bursts of highly epileptiform discharges lasting 0.5 to 3 seconds alternating with brief 1 to 2-second periods of generalized background attenuation. As propofol and Versed was restarted, EEG again showed continuous generalized sharply contoured 3 to 5 Hz theta - delta slowing.Hyperventilation and photic stimulation were not performed."  Impression: Continue to have concern for significant anoxic brain injury given  seizure activity when sedation was paused previously.  Today clinically does not appear to have the same concerns, though we will continue sedation given no EEG available here over the weekend.  Will titrate phenobarbital as needed to achieve therapeutic level.  If unable to be transferred to [facility] for long-term monitoring over the weekend, will recheck EEG on Monday.  Recommendations: -Continue sedation at current rate (versed 5 mg, propofol  -Continue Keppra 1000 mg twice daily to be adjusted as needed for renal function  Estimated Creatinine Clearance: 56.6 mL/min (A) (by C-G formula based on SCr of 1.29 mg/dL (H)).   CrCl 80 to 130 mL/minute/1.73 m2: 500 mg to 1.5 g every 12 hours.  CrCl 50 to <80 mL/minute/1.73 m2: 500 mg to 1 g every 12 hours.  CrCl 30 to <50 mL/minute/1.73 m2: 250 to 750 mg every 12 hours.  CrCl 15 to <30 mL/minute/1.73 m2: 250 to 500 mg every 12 hours.  CrCl <15 mL/minute/1.73 m2: 250 to 500 mg every 24 hours (expert opinion). -s/p phenobarb 10 mg/kg loading dose, started on 1 mg/kg/day divided twice daily  -post load level pending this morning, will follow-up and adjust as needed  -Long term AC will need to be changed from apixaban to warfarin due to drug-drug interactions  -MRI brain without contrast 3 to 5 days post event (5/7 through 5/9) to most sensitively aid prognostication  Brooke Dare MD-PhD [facility] [phone]  [facility] coverage for [facility] is from 8 AM to 4 AM in-house and 4 PM to 8 PM by telephone/video. 8 PM to 8 AM emergent questions or overnight urgent questions should be addressed to Teleneurology On-call or [facility] neurohospitalist; contact information can be found on AMION

## 2020-07-11 NOTE — Progress Notes (Signed)
NAME:  Travis Maxwell, MRN:  [id number], DOB:  27-Apr-1950, LOS: 3 ADMISSION DATE:  07/08/2020, CONSULTATION DATE:  07/08/2020 REFERRING MD:  Dr. Roxan Hockey, CHIEF COMPLAINT:  Cardiac Arrest   History of Present Illness:  70 year old male  arrived to the ED s/p out of hospital V-fib Cardiac arrest. Patient had complained of feeling hot with chest discomfort and found unresponsive by brother who started CPR. EMS arrived and patient reported in V.fib s/p shock x 2 and epi x 2, amiodarone and ROSC was achieved. In the ED patient had ventricular arrhythmia requiring shock x 2. Cardiology evaluated the patient, no emergent plans for cardiac cath at this time per report. PCCM admitted.   Of note, patient has prior hospitalization for cardiac arrest in 02/2020 for cardiogenic shock witth VF/VT. Patient with prolonged QTc in setting of olanzapine (unable to be discontinued).  Pertinent  Medical History  HTN, T2DM, HLD, GERD, CAD (s/p PC toRCAI), AVR with bioprosthetic valve, systolic CHF with EF of 20%, and paroxysmal A. Fib  Significant Hospital Events: Including procedures, antibiotic start and stop dates in addition to other pertinent events   . 5/4 Admit to ICU for cardiac arrest. Cooled and paralyzed . 5/5 Paralytics discontinued. EEG suggestive of anoxic brain injury with myoclonic seizure and generalized epileptogenicity. Central line placed for IV access . 5/6 rewarming started at midnight . 5/7 he is in maintenance of normothermia phase of targeted temperature management.  Remains on mechanical ventilation with low requirements.  He does not have significant vasopressor needs.  Interim History / Subjective:  On hypothermia protocol and maintenance of normothermia phase.  When evaluated with sedation held he still has brainstem reflexes but no signs of higher function. Objective   Blood pressure 106/74, pulse 82, temperature (!) 97.3 F (36.3 C), resp. rate (!) 24, height 5\' 10"  (1.778 m), weight  78.4 kg, SpO2 98 %. CVP:  [8 mmHg-19 mmHg] 16 mmHg  Vent Mode: PRVC FiO2 (%):  [35 %] 35 % Set Rate:  [24 bmp] 24 bmp Vt Set:  [500 mL] 500 mL PEEP:  [5 cmH20-8 cmH20] 5 cmH20 Plateau Pressure:  [19 cmH20-24 cmH20] 24 cmH20   Intake/Output Summary (Last 24 hours) at 07/11/2020 1123 Last data filed at 07/11/2020 1100 Gross per 24 hour  Intake 3131.85 ml  Output 1350 ml  Net 1781.85 ml   Filed Weights   07/08/20 1833  Weight: 78.4 kg   Physical Exam: General: Critically ill-appearing, no acute distress, artic sun in place HENT: [place], AT, ETT in place Eyes: no scleral icterus Respiratory: Clear to auscultation bilaterally.  No crackles, wheezing or rales Cardiovascular: RRR, -M/R/G, no JVD GI: BS+, soft, nontender Extremities:-Edema,-tenderness. Poor pulses. Neuro: Unresponsive off sedation, pupils 3 mm, nonreactive, no withdrawal  Labs/imaging that I havepersonally reviewed  (right click and "Reselect all SmartList Selections" daily)   Labs: Sodium 132 potassium 4.5 BUN 25 creatinine 1.29 White blood cell count 17.4 hemoglobin 15.7 platelet count 545 Resolved Hospital Problem list     Assessment & Plan:    V-fib Cardiac Arrest Peak trop 1863 -Continuous telemetry -Serial EKGs -Maintain MAP greater than 65. No pressors currently required -Cardiology consulted, appreciate input ~no plans for emergent cardiac cath at this time. Continue amio infusion and heparin infusion per cardiology  Acute hypoxic respiratory failure in the setting cardiac arrest -Full vent support, implement lung protective strategies -Wean FiO2 and PEEP as tolerated to maintain O2 sats greater than 92% -check ABG -Implement VAP bundle -As needed  bronchodilators -WUA/SBT daily. However mental status precludes extubation  Anoxic brain injury Seizure -CT head neg. EEG with seizures and epileptic activity -Versed gtt, propofol gtt, keppra -Neurology consulted. Appreciate input  Leukocytosis -  persists -Monitor fever curve -F/u cultures -Not on abx currenlty  Acute kidney injury - creat 1.29 -Monitor I&O's / urinary output -Follow BMP -Ensure adequate renal perfusion -Avoid nephrotoxic agents as able -Replace electrolytes as indicated  Mildly Elevated LFTs -Trend LFTs  Hyperglycemia -CBG's -SSI -Follow ICU Hypo/Hyperglycemia protocol  Diminished pulses: Noted to have pulsatile on bedside ultrasound of DP and femoral. Difficulty with Doppler -Needs formal US of lower extremities.  Awaiting transfer to [facility]   Best practice (right click and "Reselect all SmartList Selections" daily)  Diet:  Start tube feeds Pain/Anxiety/Delirium protocol (if indicated): Yes (RASS goal -1) VAP protocol (if indicated): Yes DVT prophylaxis: Systemic AC GI prophylaxis: PPI Glucose control:  SSI Yes Central venous access:  N/A Arterial line:  N/A Foley:  Yes, and it is still needed Mobility:  bed rest  PT consulted: N/A Last date of multidisciplinary goals of care discussion [5/6] Confirmed full code status. Dr Mikki Harbor spoke with daughter [place] Sink on 07/10/20 and she thinks patient would want to live at all costs. She thinks he would have been okay with trach and PEG if he should need it.  Code Status:  full code Disposition: ICU  The patient is critically ill with multiple organ systems failure and requires high complexity decision making for assessment and support, frequent evaluation and titration of therapies, application of advanced monitoring technologies and extensive interpretation of multiple databases.  Independent Critical Care Time: 50 minutes   Please see Amion for pager number to reach on-call Pulmonary and Critical Care Team.

## 2020-07-11 NOTE — Plan of Care (Signed)
Patient remains intubated and sedated. Some brainstem functions intact upon WUA (see Dr. Eden Emms note). Plan is for transfer to [facility] for LTM EEG. Also needs follow-up MRI. Achieved re-warm goal at 1355 hrs on 07/10/20; will remain on [place] for 48 hrs post-rewarm time per protocol.

## 2020-07-12 ENCOUNTER — Encounter: Payer: Self-pay | Admitting: Pulmonary Disease

## 2020-07-12 ENCOUNTER — Inpatient Hospital Stay: Payer: Medicare Other

## 2020-07-12 LAB — PHENOBARBITAL LEVEL: Phenobarbital: 24 ug/mL (ref 15.0–30.0)

## 2020-07-12 LAB — BASIC METABOLIC PANEL
Anion gap: 9 (ref 5–15)
BUN: 22 mg/dL (ref 8–23)
CO2: 18 mmol/L — ABNORMAL LOW (ref 22–32)
Calcium: 8.2 mg/dL — ABNORMAL LOW (ref 8.9–10.3)
Chloride: 110 mmol/L (ref 98–111)
Creatinine, Ser: 1.11 mg/dL (ref 0.61–1.24)
GFR, Estimated: >60 mL/min (ref >60)
Glucose, Bld: 190 mg/dL — ABNORMAL HIGH (ref 70–99)
Potassium: 4.4 mmol/L (ref 3.5–5.1)
Sodium: 137 mmol/L (ref 135–145)

## 2020-07-12 LAB — GLUCOSE, CAPILLARY
Glucose-Capillary: 174 mg/dL — ABNORMAL HIGH (ref 70–99)
Glucose-Capillary: 158 mg/dL — ABNORMAL HIGH (ref 70–99)
Glucose-Capillary: 186 mg/dL — ABNORMAL HIGH (ref 70–99)
Glucose-Capillary: 178 mg/dL — ABNORMAL HIGH (ref 70–99)
Glucose-Capillary: 169 mg/dL — ABNORMAL HIGH (ref 70–99)

## 2020-07-12 LAB — HEPARIN LEVEL (UNFRACTIONATED): Heparin Unfractionated: 0.52 IU/mL (ref 0.30–0.70)

## 2020-07-12 LAB — CBC
HCT: 47.4 % (ref 39.0–52.0)
Hemoglobin: 14.1 g/dL (ref 13.0–17.0)
MCH: 19.6 pg — ABNORMAL LOW (ref 26.0–34.0)
MCHC: 29.7 g/dL — ABNORMAL LOW (ref 30.0–36.0)
MCV: 65.7 fL — ABNORMAL LOW (ref 80.0–100.0)
Platelets: 599 10*3/uL — ABNORMAL HIGH (ref 150–400)
RBC: 7.21 MIL/uL — ABNORMAL HIGH (ref 4.22–5.81)
RDW: 25.8 % — ABNORMAL HIGH (ref 11.5–15.5)
WBC: 16.7 10*3/uL — ABNORMAL HIGH (ref 4.0–10.5)
nRBC: 0 % (ref 0.0–0.2)

## 2020-07-12 LAB — PHOSPHORUS: Phosphorus: 2.3 mg/dL — ABNORMAL LOW (ref 2.5–4.6)

## 2020-07-12 LAB — MAGNESIUM: Magnesium: 1.9 mg/dL (ref 1.7–2.4)

## 2020-07-12 LAB — APTT
aPTT: 59 seconds — ABNORMAL HIGH (ref 24–36)
aPTT: 55 seconds — ABNORMAL HIGH (ref 24–36)
aPTT: 55 seconds — ABNORMAL HIGH (ref 24–36)

## 2020-07-12 MED ORDER — SODIUM CHLORIDE 0.9 % IV SOLN
Freq: Once | INTRAVENOUS | Status: AC
  Filled 2020-07-12: qty 100

## 2020-07-12 MED ORDER — PHENOBARBITAL SODIUM 65 MG/ML IJ SOLN: 10.0000 mg/kg | Freq: Once | INTRAMUSCULAR | Status: DC

## 2020-07-12 MED ORDER — HEPARIN BOLUS VIA INFUSION
1100.0000 [IU] | INTRAVENOUS | Status: AC
  Administered 2020-07-12: 1100 [IU] via INTRAVENOUS
  Filled 2020-07-12: qty 1100

## 2020-07-12 MED ORDER — HEPARIN BOLUS VIA INFUSION
1100.0000 [IU] | Freq: Once | INTRAVENOUS | Status: AC
  Administered 2020-07-12: 1100 [IU] via INTRAVENOUS
  Filled 2020-07-12: qty 1100

## 2020-07-12 MED ORDER — HEPARIN BOLUS VIA INFUSION
1000.0000 [IU] | INTRAVENOUS | Status: AC
  Administered 2020-07-12: 1000 [IU] via INTRAVENOUS
  Filled 2020-07-12: qty 1000

## 2020-07-12 NOTE — Consult Note (Signed)
ANTICOAGULATION CONSULT NOTE   Pharmacy Consult for heparin Indication: chest pain/ACS  Not on File  Patient Measurements: Height: 5\' 10"  (177.8 cm) Weight: 75.6 kg (166 lb 10.7 oz) IBW/kg (Calculated) : 73 Heparin Dosing Weight: 78.4 kg  Vital Signs: Temp: 98.4 F (36.9 C) (05/08 0300) Temp Source: Esophageal (05/08 0300) BP: 103/69 (05/08 0300) Pulse Rate: 91 (05/08 0300)  Labs: Recent Labs    07/09/20 0617 07/09/20 0848 07/10/20 09/09/20 07/10/20 09/09/20 07/10/20 0812 07/10/20 1622 07/10/20 2139 07/11/20 0445 07/11/20 1127 07/11/20 1852 07/12/20 0300  HGB  --    < > 16.1 19.4*  --   --   --  15.7  --   --   --   HCT  --    < > 55.0* 57.0*  --   --   --  51.6  --   --   --   PLT  --   --  683*  --   --   --   --  545*  --   --   --   APTT  --    < > 161*  --    < >  --    < > 100* 56* 53* 59*  HEPARINUNFRC  --    < > >1.10*  --   --   --   --  0.96*  --  0.62 0.52  CREATININE 1.51*   < > 1.05 0.90   < > 1.25*  --  1.29*  --   --  1.11  TROPONINIHS 1,367*  --   --   --   --   --   --   --   --   --   --    < > = values in this interval not displayed.    Estimated Creatinine Clearance: 65.8 mL/min (by C-G formula based on SCr of 1.11 mg/dL).   Medical History: History reviewed. No pertinent past medical history.  Medications:  Pt on Eliquis 5 mg BID PTA.  Assessment: Travis Maxwell is a 70 y.o. male who presents to the ER with CPR in progress.  Patient reportedly was having chest pain witnessed arrest. Patient arrives with spontaneous respirations and ROSC. Pharmacy has been consulted for heparin dosing for ACS.  Note: Patient with prolonged QTc in setting of olanzapine (unable to be discontinued). Hgb: 15.4 and plts: 611 Heparin Dosing Weight: 78.4 kg   Goal of Therapy:  Heparin level 0.3-0.7 units/ml once correlating with aPTT Monitor platelets by anticoagulation protocol: Yes  Goal aPTT 66-102  05/05 0124 HL >1.10, aPTT >200, INR 1.7 05/05 2135 aPTT  63 05/06 0337 aPTT 161, HL >1.10; Per RN, lab drawn from central line, with Heparin running in PIV line 05/06 1213 aPTT 50 05/06 2139 aPTT 79, therapeutic x 1 on 750units/hr  05/07 0445 aPTT 100, supratherapeutic, Decrease rate to 600 units/hr 05/07 1127 aPTT 56    Subtherapeutic, inc.rate to 700 unit/hr 05/07 1852 aPTT 53 Subtherapeutic 05/08 0346 aPTT 59, subtherapeutic, HL 0.59   Plan:    Will order 1000 unit bolus   Will increase heparin infusion to 800 units/hr.  Will recheck aPTT in 6 hours.  Due to pt being on DOAC PTA, will follow aPTT until correlation with HL.  Daily HL and CBC while on heparin.  07/08, PharmD, [facility] 07/12/2020 4:02 AM

## 2020-07-12 NOTE — Progress Notes (Signed)
NAME:  Braison Snoke, MRN:  [id number], DOB:  May 05, 1950, LOS: 4 ADMISSION DATE:  07/08/2020, CONSULTATION DATE:  07/08/2020 REFERRING MD:  Dr. Roxan Hockey, CHIEF COMPLAINT:  Cardiac Arrest   History of Present Illness:  70 year old male  arrived to the ED s/p out of hospital V-fib Cardiac arrest. Patient had complained of feeling hot with chest discomfort and found unresponsive by brother who started CPR. EMS arrived and patient reported in V.fib s/p shock x 2 and epi x 2, amiodarone and ROSC was achieved. In the ED patient had ventricular arrhythmia requiring shock x 2. Cardiology evaluated the patient, no emergent plans for cardiac cath at this time per report. PCCM admitted.   Of note, patient has prior hospitalization for cardiac arrest in 02/2020 for cardiogenic shock witth VF/VT. Patient with prolonged QTc in setting of olanzapine (unable to be discontinued).  Pertinent  Medical History  HTN, T2DM, HLD, GERD, CAD (s/p PC toRCAI), AVR with bioprosthetic valve, systolic CHF with EF of 20%, and paroxysmal A. Fib  Significant Hospital Events: Including procedures, antibiotic start and stop dates in addition to other pertinent events   . 5/4 Admit to ICU for cardiac arrest. Cooled and paralyzed . 5/5 Paralytics discontinued. EEG suggestive of anoxic brain injury with myoclonic seizure and generalized epileptogenicity. Central line placed for IV access . 5/6 rewarming started at midnight . 5/7 he is in maintenance of normothermia phase of targeted temperature management.  Remains on mechanical ventilation with low requirements.  He does not have significant vasopressor needs.  Interim History / Subjective:  Laying in bed with eyes closed intubated.  Remains on mechanical ventilation.  Evaluated by neurology this morning.  Brainstem reflexes present.  Objective   Blood pressure 111/66, pulse 89, temperature 98.4 F (36.9 C), temperature source Esophageal, resp. rate (!) 24, height 5\' 10"  (1.778  m), weight 75.6 kg, SpO2 98 %. CVP:  [9 mmHg-15 mmHg] 11 mmHg  Vent Mode: PRVC FiO2 (%):  [35 %] 35 % Set Rate:  [24 bmp] 24 bmp Vt Set:  [500 mL] 500 mL PEEP:  [5 cmH20] 5 cmH20 Plateau Pressure:  [17 cmH20-19 cmH20] 19 cmH20   Intake/Output Summary (Last 24 hours) at 07/12/2020 1243 Last data filed at 07/12/2020 1200 Gross per 24 hour  Intake 2852.1 ml  Output 2010 ml  Net 842.1 ml   Filed Weights   07/08/20 1833 07/12/20 0352  Weight: 78.4 kg 75.6 kg   Physical Exam: General: Critically ill-appearing, no acute distress, artic sun in place HENT: [place], AT, ETT in place Eyes: no scleral icterus Respiratory: Clear to auscultation bilaterally.  No crackles, wheezing or rales Cardiovascular: RRR, -M/R/G, no JVD GI: BS+, soft, nontender Extremities:-Edema,-tenderness. Poor pulses. Neuro: Unresponsive off sedation, pupils 3 mm, nonreactive, no withdrawal.  Brainstem reflexes present during sedation break.  Labs/imaging that I havepersonally reviewed  (right click and "Reselect all SmartList Selections" daily)   Labs: Sodium 137 potassium 4.4 BUN 22 creatinine 1.11 White blood cell count 16.7 hemoglobin 14.1 platelet count 599 Resolved Hospital Problem list     Assessment & Plan:    Out of hospital V-fib Cardiac Arrest Peak trop 1863 -Continuous telemetry -Maintain MAP greater than 65. No pressors currently required -Cardiology consulted, appreciate input ~no plans for emergent cardiac cath at this time. Continue amio infusion and heparin infusion per cardiology  Acute hypoxic respiratory failure in the setting cardiac arrest.  Vent needs are minimal. -Full vent support, implement lung protective strategies -Wean FiO2 and PEEP  as tolerated to maintain O2 sats greater than 92% -VAP bundle -As needed bronchodilators  Anoxic brain injury Seizure -CT head neg. EEG with seizures and epileptic activity -Versed gtt, propofol gtt, keppra -Neurology consulted. Appreciate  input. -Tomorrow neurology plans to hold sedation and perform EEG to assure there is no seizures.  If no seizure activity will discontinue continuous sedation and follow neuro exam  Leukocytosis - persists -Monitor fever curve -F/u cultures -Not on abx currenlty  Acute kidney injury with improving creat.  Mild associated metabolic acidosis.  AG 9 - creat 1.11 -Monitor I&O's / urinary output -Follow BMP -Ensure adequate renal perfusion -Avoid nephrotoxic agents as able -Replace electrolytes as indicated  Mildly Elevated LFTs -Trend LFTs  Hyperglycemia -CBG's -SSI -Follow ICU Hypo/Hyperglycemia protocol  Awaiting transfer to [facility]   Best practice (right click and "Reselect all SmartList Selections" daily)  Diet:  Start tube feeds Pain/Anxiety/Delirium protocol (if indicated): Yes (RASS goal -1) VAP protocol (if indicated): Yes DVT prophylaxis: Systemic AC GI prophylaxis: PPI Glucose control:  SSI Yes Central venous access:  N/A Arterial line:  N/A Foley:  Yes, and it is still needed Mobility:  bed rest  PT consulted: N/A Last date of multidisciplinary goals of care discussion [5/6] Confirmed full code status. Dr Mikki Harbor spoke with daughter [place] Sink on 07/10/20 and she thinks patient would want to live at all costs. She thinks he would have been okay with trach and PEG if he should need it.  Code Status:  full code Disposition: ICU  The patient is critically ill with multiple organ systems failure and requires high complexity decision making for assessment and support, frequent evaluation and titration of therapies, application of advanced monitoring technologies and extensive interpretation of multiple databases.  Independent Critical Care Time: 35 minutes   Please see Amion for pager number to reach on-call Pulmonary and Critical Care Team.

## 2020-07-12 NOTE — Consult Note (Signed)
ANTICOAGULATION CONSULT NOTE   Pharmacy Consult for heparin Indication: chest pain/ACS  Not on File  Patient Measurements: Height: 5\' 10"  (177.8 cm) Weight: 75.6 kg (166 lb 10.7 oz) IBW/kg (Calculated) : 73 Heparin Dosing Weight: 78.4 kg  Vital Signs: Temp: 98.6 F (37 C) (05/08 0700) Temp Source: Esophageal (05/08 0700) BP: 128/77 (05/08 1000) Pulse Rate: 96 (05/08 1000)  Labs: Recent Labs    07/10/20 0337 07/10/20 09/09/20 07/10/20 0812 07/10/20 1622 07/10/20 2139 07/11/20 0445 07/11/20 1127 07/11/20 1852 07/12/20 0300 07/12/20 0940  HGB 16.1 19.4*  --   --   --  15.7  --   --  14.1  --   HCT 55.0* 57.0*  --   --   --  51.6  --   --  47.4  --   PLT 683*  --   --   --   --  545*  --   --  599*  --   APTT 161*  --    < >  --    < > 100*   < > 53* 59* 55*  HEPARINUNFRC >1.10*  --   --   --   --  0.96*  --  0.62 0.52  --   CREATININE 1.05 0.90   < > 1.25*  --  1.29*  --   --  1.11  --    < > = values in this interval not displayed.    Estimated Creatinine Clearance: 65.8 mL/min (by C-G formula based on SCr of 1.11 mg/dL).   Medical History: History reviewed. No pertinent past medical history.  Medications:  Pt on Eliquis 5 mg BID PTA.  Assessment: 09/11/20 Doe is a 70 y.o. male who presents to the ER with CPR in progress.  Patient reportedly was having chest pain witnessed arrest. Patient arrives with spontaneous respirations and ROSC. Pharmacy has been consulted for heparin dosing for ACS.  Note: Patient with prolonged QTc in setting of olanzapine (unable to be discontinued). Hgb: 15.4 and plts: 611 Heparin Dosing Weight: 78.4 kg   Goal of Therapy:  Heparin level 0.3-0.7 units/ml once correlating with aPTT Monitor platelets by anticoagulation protocol: Yes  Goal aPTT 66-102  05/05 0124 HL >1.10, aPTT >200, INR 1.7 05/05 2135 aPTT 63 05/06 0337 aPTT 161, HL >1.10; Per RN, lab drawn from central line, with Heparin running in PIV line 05/06 1213 aPTT  50 05/06 2139 aPTT 79, therapeutic x 1 on 750units/hr  05/07 0445 aPTT 100, supratherapeutic, Decrease rate to 600 units/hr 05/07 1127 aPTT 56    Subtherapeutic, inc.rate to 700 unit/hr 05/07 1852 aPTT 53 Subtherapeutic 05/08 0346 aPTT 59, subtherapeutic, HL 0.59 not correlating 05/08 0940 aPTT 55   Plan:    Will order 1100 unit bolus   Will increase heparin infusion to 900 units/hr.  Will recheck aPTT in 6 hours.  Due to pt being on DOAC PTA, will follow aPTT until correlation with HL.  Daily HL and CBC while on heparin.  07/08 PharmD Clinical Pharmacist 07/12/2020

## 2020-07-12 NOTE — Progress Notes (Signed)
Patient Name: Travis Maxwell Date of Encounter: 07/12/2020  Hospital Problem List     Active Problems:   Cardiac arrest [facility])   Malnutrition of moderate degree   Acute respiratory failure [facility])    Patient Profile      70 y.o.malewith a past medical history significant for coronary artery disease (proximal RCA stent 2019), congestive heart failure (EF 20 to 25% 01/21/2020) hypertension, diabetes, aortic valve replacement, paroxysmal atrial fibrillation with RVR (on Eliquis), mood disorder (on olanzapine), ongoing tobacco abuse, malnutritionadmitted after VF arrest both in the field and in the ER. Was admitted in December of last year with a similar admission. Was on amiodarone both IV along with IV lidocaine during that admission for CHF 2018. QT was prolonged and remains prolonged however recommendation by EP was to persist with amiodarone despite this. Patient remains in sinus rhythm with prolonged QT on IV amiodarone drip. Has had no breakthrough arrhythmias. 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 Patient is currently unresponsive with probable anoxic encephalopathy. Neurology is involved. Echocardiogram read during this admission showed EF of 20 to 25% with global hypokinesis. Did not appear to have critical aortic stenosis. No pericardial effusion.  Subjective   Intubated and sedated.  No purposeful motion.  Inpatient Medications    . artificial tears  1 application Both Eyes X4V  . chlorhexidine gluconate (MEDLINE KIT)  15 mL Mouth Rinse BID  . Chlorhexidine Gluconate Cloth  6 each Topical Daily  . feeding supplement (PROSource TF)  45 mL Per Tube BID  . free water  30 mL Per Tube Q4H  . insulin aspart  0-20 Units Subcutaneous Q4H  . mouth rinse  15 mL Mouth Rinse 10 times  per day  . pantoprazole (PROTONIX) IV  40 mg Intravenous QHS  . PHENObarbital  1 mg/kg/day Intravenous BID    Vital Signs    Vitals:   07/12/20 0630 07/12/20 0700 07/12/20 0730 07/12/20 0800  BP: 109/71 106/72 105/72 110/73  Pulse: 87 89 86 88  Resp:  (!) 24    Temp:  98.6 F (37 C)    TempSrc:  Esophageal    SpO2: 100% 99% 99% 98%  Weight:      Height:        Intake/Output Summary (Last 24 hours) at 07/12/2020 0816 Last data filed at 07/12/2020 0600 Gross per 24 hour  Intake 2907.88 ml  Output 2180 ml  Net 727.88 ml   Filed Weights   07/08/20 1833 07/12/20 0352  Weight: 78.4 kg 75.6 kg    Physical Exam    GEN: Acute and chronically ill male HEENT: Intubated. . Cardiac: RRR,  Respiratory: Breathing with vent. GI: Soft, nontender, nondistended, BS + x 4. MS: no deformity or atrophy. Skin: warm and dry, no rash. Neuro: Intubated and sedated  Labs    CBC Recent Labs    07/11/20 0445 07/12/20 0300  WBC 17.4* 16.7*  NEUTROABS 13.0*  --   HGB 15.7 14.1  HCT 51.6 47.4  MCV 65.9* 65.7*  PLT 545* 859*   Basic Metabolic Panel Recent Labs    07/11/20 0445 07/11/20 1715 07/12/20 0300  NA 132*  --  137  K 4.5  --  4.4  CL 108  --  110  CO2 17*  --  18*  GLUCOSE 180*  --  190*  BUN 25*  --  22  CREATININE 1.29*  --  1.11  CALCIUM 7.9*  --  8.2*  MG 2.0 2.0 1.9  PHOS 4.1 3.0 2.3*   Liver Function Tests Recent Labs    07/10/20 0812 07/10/20 1622  AST 79* 77*  ALT 94* 100*  ALKPHOS 75 83  BILITOT 0.9 0.7  PROT 6.6 7.1  ALBUMIN 3.1* 3.4*   No results for input(s): LIPASE, AMYLASE in the last 72 hours. Cardiac Enzymes No results for input(s): CKTOTAL, CKMB, CKMBINDEX, TROPONINI in the last 72 hours. BNP No results for input(s): BNP in the last 72 hours. D-Dimer No results for input(s): DDIMER in the last 72 hours. Hemoglobin A1C No results for input(s): HGBA1C in the last 72 hours. Fasting Lipid Panel No results for input(s): CHOL, HDL,  LDLCALC, TRIG, CHOLHDL, LDLDIRECT in the last 72 hours. Thyroid Function Tests No results for input(s): TSH, T4TOTAL, T3FREE, THYROIDAB in the last 72 hours.  Invalid input(s): FREET3  Telemetry    Normal sinus rhythm  ECG    Normal sinus rhythm no ischemia  Radiology    DG Abd 1 View  Result Date: 07/09/2020 CLINICAL DATA:  NG tube placement. EXAM: ABDOMEN - 1 VIEW COMPARISON:  07/08/2020. FINDINGS: NG tube noted with tip over the stomach. Gastric distention noted. Debris in the stomach may be present. Small-bowel distention also noted. Colonic gas pattern is normal. No free air. No acute bony abnormality. IMPRESSION: NG tube noted with tip over the stomach. Moderate gastric distention. Debris may be present in the stomach. Small-bowel distention is also noted. Follow-up abdominal series suggested to demonstrate resolution of gastric and small bowel distention. Electronically Signed   By: Marcello Moores  Register   On: 07/09/2020 16:19   DG Abd 1 View  Result Date: 07/08/2020 CLINICAL DATA:  Orogastric tube placement EXAM: ABDOMEN - 1 VIEW COMPARISON:  02/02/2020 FINDINGS: Nasogastric tube is in place with its tip within the expected proximal body of the stomach and its proximal side hole in the expected location of the gastroesophageal junction. There is mild gaseous distension of the stomach. Thel pelvis is excluded from view. 8 mm stable calcification overlies the right kidney. Mild cardiomegaly is stable. Aortic valve replacement has been performed. IMPRESSION: Nasogastric tube just within the proximal body of the stomach. Advancement by 5-10 cm may better position the catheter within the gastric lumen. Electronically Signed   By: Fidela Salisbury MD   On: 07/08/2020 23:42   CT Head Wo Contrast  Result Date: 07/08/2020 CLINICAL DATA:  Altered mental status EXAM: CT HEAD WITHOUT CONTRAST TECHNIQUE: Contiguous axial images were obtained from the base of the skull through the vertex without  intravenous contrast. COMPARISON:  02/13/2020 FINDINGS: Brain: Normal anatomic configuration. Parenchymal volume loss is commensurate with the patient's age. Mild periventricular white matter changes are present likely reflecting the sequela of small vessel ischemia. Right parietal encephalomalacia is unchanged. 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 No abnormal intra or extra-axial mass lesion or fluid collection. No abnormal mass effect or midline shift. No evidence of acute intracranial hemorrhage or infarct. Ventricular size is normal. Cerebellum unremarkable. Vascular: No asymmetric hyperdense vasculature at the skull base. Skull: Intact Sinuses/Orbits: Paranasal sinuses are clear. Orbits are unremarkable. Other: Mastoid air cells and middle ear cavities are clear. IMPRESSION: No acute intracranial abnormality. Stable senescent changes and remote infarcts. Electronically Signed   By: Fidela Salisbury MD   On: 07/08/2020 20:20  US Venous Img Lower Bilateral (DVT)  Result Date: 07/10/2020 CLINICAL DATA:  Bilateral lower extremity edema. EXAM: BILATERAL LOWER EXTREMITY VENOUS DOPPLER ULTRASOUND TECHNIQUE: Gray-scale sonography with graded compression, as well as color Doppler and duplex ultrasound were performed to evaluate the lower extremity deep venous systems from the level of the common femoral vein and including the common femoral, femoral, profunda femoral, popliteal and calf veins including the posterior tibial, peroneal and gastrocnemius veins when visible. The superficial great saphenous vein was also interrogated. Spectral Doppler was utilized to evaluate flow at rest and with distal augmentation maneuvers in the common femoral, femoral and popliteal veins. COMPARISON:  None. FINDINGS: RIGHT LOWER EXTREMITY Common Femoral Vein: No evidence of thrombus. Normal compressibility, respiratory phasicity and response to augmentation. Saphenofemoral  Junction: No evidence of thrombus. Normal compressibility and flow on color Doppler imaging. Profunda Femoral Vein: No evidence of thrombus. Normal compressibility and flow on color Doppler imaging. Femoral Vein: No evidence of thrombus. Normal compressibility, respiratory phasicity and response to augmentation. Popliteal Vein: No evidence of thrombus. Normal compressibility, respiratory phasicity and response to augmentation. Calf Veins: No evidence of thrombus. Normal compressibility and flow on color Doppler imaging. Superficial Great Saphenous Vein: No evidence of thrombus. Normal compressibility. Venous Reflux:  None. Other Findings: No evidence of superficial thrombophlebitis or abnormal fluid collection. LEFT LOWER EXTREMITY Common Femoral Vein: No evidence of thrombus. Normal compressibility, respiratory phasicity and response to augmentation. Saphenofemoral Junction: No evidence of thrombus. Normal compressibility and flow on color Doppler imaging. Profunda Femoral Vein: No evidence of thrombus. Normal compressibility and flow on color Doppler imaging. Femoral Vein: No evidence of thrombus. Normal compressibility, respiratory phasicity and response to augmentation. Popliteal Vein: No evidence of thrombus. Normal compressibility, respiratory phasicity and response to augmentation. Calf Veins: No evidence of thrombus. Normal compressibility and flow on color Doppler imaging. Superficial Great Saphenous Vein: No evidence of thrombus. Normal compressibility. Venous Reflux:  None. Other Findings: No evidence of superficial thrombophlebitis or abnormal fluid collection. IMPRESSION: No evidence of deep venous thrombosis in either lower extremity. Electronically Signed   By: Aletta Edouard M.D.   On: 07/10/2020 08:01   DG Chest Port 1 View  Result Date: 07/12/2020 CLINICAL DATA:  70 year old male with shortness of breath. EXAM: PORTABLE CHEST 1 VIEW COMPARISON:  Chest radiograph dated 07/11/2020. FINDINGS:  Endotracheal tube above the carina. Right IJ central venous line in similar position. Enteric tube extends below the diaphragm with tip in similar position. No significant interval change in the appearance of the lungs compared to the prior radiograph. No pleural effusion or pneumothorax. Stable cardiomegaly and vascular congestion. Median sternotomy wires. No acute osseous pathology. IMPRESSION: No significant interval change. Electronically Signed   By: Anner Crete M.D.   On: 07/12/2020 03:51   DG Chest Port 1 View  Result Date: 07/11/2020 CLINICAL DATA:  Intubated EXAM: PORTABLE CHEST 1 VIEW COMPARISON:  07/09/2020 chest radiograph. FINDINGS: Endotracheal tube tip is 4.5 cm above the carina. Enteric tube terminates in proximal stomach. Right internal jugular central venous catheter terminates in middle third of the SVC. Intact sternotomy wires. Cardiac valvular prosthesis noted. Pacer pad overlies the left chest. Stable cardiomediastinal silhouette with mild cardiomegaly. No pneumothorax. No pleural effusion. Mild pulmonary edema, slightly improved. Right lung base atelectasis is similar. IMPRESSION: 1. Well-positioned support structures. 2. Mild congestive heart failure, slightly improved. 3. Stable right lung base atelectasis. Electronically Signed   By: Ilona Sorrel M.D.   On: 07/11/2020 07:55   DG Chest [facility]  1 View  Result Date: 07/09/2020 CLINICAL DATA:  Central venous catheter placement EXAM: PORTABLE CHEST 1 VIEW COMPARISON:  07/08/2020 FINDINGS: Right internal jugular central venous catheter has been placed with its tip overlying the superior vena cava. Endotracheal tube is seen 4.8 cm above the carina. Nasogastric tube extends into the upper abdomen beyond the margin of the examination. Mild perihilar interstitial pulmonary edema, asymmetrically more severe within the left perihilar region, is again seen in keeping with probable trace perihilar pulmonary edema. No pneumothorax or pleural  effusion. Mild cardiomegaly is stable. Aortic valve replacement has been performed. 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 IMPRESSION: Right internal jugular central venous catheter tip within the superior vena cava. No pneumothorax. Otherwise stable lines and tubes. Trace bilateral perihilar pulmonary edema, likely cardiogenic in nature. Stable cardiomegaly. Electronically Signed   By: Fidela Salisbury MD   On: 07/09/2020 15:59   DG Chest Portable 1 View  Result Date: 07/08/2020 CLINICAL DATA:  Status post CPR and intubation EXAM: PORTABLE CHEST 1 VIEW COMPARISON:  None. FINDINGS: Cardiac shadow is enlarged. Postsurgical changes are seen. Endotracheal tube is noted in satisfactory position. Gastric catheter is noted with the tip in the mid to distal esophagus. This should be advanced several cm deeper into the stomach. Central vascular congestion is noted. No focal infiltrate is seen. No acute bony abnormality is noted. IMPRESSION: Tubes and lines as described. Gastric catheter should be advanced several cm deeper into the stomach. Central vascular congestion consistent with the recent cardiac arrest. Electronically Signed   By: Inez Catalina M.D.   On: 07/08/2020 18:43   EEG adult  Result Date: 07/10/2020 Lora Havens, MD     07/10/2020  2:21 PM Patient Name: Nobel Brar MRN: [id number] Epilepsy Attending: Lora Havens Referring Physician/Provider: Dr. Lesleigh Noe Date: 07/10/2020 Duration: 43.47 mins  Patient history: 70 year old male status post V. fib arrest.  EEG to evaluate seizures.  Level of alertness: Comatose  AEDs during EEG study: Propofol, Versed, LEV  Technical aspects: This EEG study was done with scalp electrodes positioned according to the 10-20 International system of electrode placement. Electrical activity was acquired at a sampling rate of 500Hz  and reviewed with a high frequency filter of 70Hz  and a low frequency filter of 1Hz . EEG data were  recorded continuously and digitally stored.  Description: EEG initially showed continuous generalized 3 to 5 Hz theta-delta slowing. After propofol and Versed was stopped, EEG showed generalized periodic epileptiform discharges with overriding fast activity at 0.75-1.5Hz . EEG gradually worsened and showed generalized bursts of highly epileptiform discharges lasting 0.5 to 3 seconds alternating with brief 1 to 2-second periods of generalized background attenuation. As propofol and Versed was restarted, EEG again showed continuous generalized sharply contoured 3 to 5 Hz theta - delta slowing. Hyperventilation and photic stimulation were not performed.    ABNORMALITY -Burst attenuation with highly epileptiform discharges, generalized -Continuous slow, generalized  IMPRESSION: This study showed evidence of generalized epileptogenicity with high potential for seizures as well as profound diffuse encephalopathy. In the setting of cardiac arrest, this EEG is suggestive of anoxic/hypoxic brain injury. Dr Curly Shores was notified.  Lora Havens   EEG adult  Result Date: 07/09/2020 Lora Havens, MD     07/09/2020  2:03 PM Patient Name: Garv Kuechle MRN: [id number] Epilepsy Attending: Lora Havens Referring Physician/Provider: Dr. Margaretha Seeds Date: 07/09/2020 Duration: 33.41 mins Patient history: 70 year old male status post V. fib arrest.  EEG to evaluate seizures.  Level of alertness: Comatose AEDs during EEG study: Propofol, Versed Technical aspects: This EEG study was done with scalp electrodes positioned according to the 10-20 International system of electrode placement. Electrical activity was acquired at a sampling rate of 500Hz  and reviewed with a high frequency filter of 70Hz  and a low frequency filter of 1Hz . EEG data were recorded continuously and digitally stored. Description: EEG initially showed continuous generalized background attenuation. Patient was also noted to have brief full body jerks  every few seconds. Concomitant EEG showed generalized spike consistent with myoclonic seizure.  Propofol was started at around 1245 after which myoclonic seizures stopped.  he was administered at 1305 after which eeg showed continuous generalized background attenuation with generalized periodic discharges at 1 Hz.  EEG was reactive to tactile stimulation.  Hyperventilation and photic stimulation were not performed.   ABNORMALITY -Myoclonic seizures, generalized -Periodic discharges, generalized -Background attenuation, generalized IMPRESSION: This study initially showed myoclonic seizures every few seconds.  Propofol was started at around 1245 after which myoclonic seizures stopped and EEG was suggestive of generalized epileptogenicity  profound diffuse encephalopathy. These EEG findings are suggestive of anoxic/hypoxic brain injury. Dr Curly Shores was notified. Lora Havens   ECHOCARDIOGRAM COMPLETE  Result Date: 07/09/2020    ECHOCARDIOGRAM REPORT   Patient Name:   MAXI RODAS Date of Exam: 07/09/2020 Medical Rec #:  [id number]      Height:       70.0 in Accession #:    [id number]     Weight:       172.8 lb Date of Birth:  12/04/1950      BSA:          1.962 m Patient Age:    72 years       BP:           103/75 mmHg Patient Gender: M              HR:           47 bpm. Exam Location:  [facility] Procedure: 2D Echo, Color Doppler and Cardiac Doppler Indications:     I46.9 Cardiac arrest  History:         Patient has no prior history of Echocardiogram examinations. No                  medical Hx.  Sonographer:     Charmayne Sheer RDCS (AE) Referring Phys:  [id number] Bradly Bienenstock Diagnosing Phys: Ida Rogue MD  Sonographer Comments: Technically challenging study due to limited acoustic windows and echo performed with patient supine and on artificial respirator. IMPRESSIONS  1. Left ventricular ejection fraction, by estimation, is 20 to 25%. The left ventricle has severely decreased function. The left ventricle  demonstrates global hypokinesis. The left ventricular internal cavity size was severely dilated. Left ventricular diastolic parameters are indeterminate.  2. Right ventricular systolic function is moderately reduced. The right ventricular size is normal.  3. Left atrial size was moderately dilated.  4. The aortic valve was not well visualized. Aortic valve regurgitation is not visualized. Unable to estimate gradient. FINDINGS  Left Ventricle: Left ventricular ejection fraction, by estimation, is 20 to 25%. The left ventricle has severely decreased function. The left ventricle demonstrates global hypokinesis. The left ventricular internal cavity size was severely dilated. There is no left ventricular hypertrophy. Left ventricular diastolic parameters are indeterminate. Right Ventricle: The right ventricular size is normal. No increase in right ventricular wall thickness. Right ventricular systolic function is moderately reduced. Left  Atrium: Left atrial size was moderately dilated. Right Atrium: Right atrial size was normal in size. Pericardium: There is no evidence of pericardial effusion. Mitral Valve: The mitral valve is normal in structure. No evidence of mitral valve regurgitation. No evidence of mitral valve stenosis. MV peak gradient, 2.2 mmHg. The mean mitral valve gradient is 1.0 mmHg. Tricuspid Valve: The tricuspid valve is normal in structure. Tricuspid valve regurgitation is not demonstrated. No evidence of tricuspid stenosis. Aortic Valve: The aortic valve was not well visualized. Aortic valve regurgitation is not visualized. Unabvle to exclude stenosis. Grossly Aortic valve mean gradient measures 7.0 mmHg. Aortic valve peak gradient measures 13.7 mmHg. Pulmonic Valve: The pulmonic valve was normal in structure. Pulmonic valve regurgitation is not visualized. No evidence of pulmonic stenosis. Aorta: The aortic root is normal in size and structure. Venous: The inferior vena cava is normal in size with  greater than 50% respiratory variability, suggesting right atrial pressure of 3 mmHg. IAS/Shunts: No atrial level shunt detected by color flow Doppler.  LEFT VENTRICLE PLAX 2D LVIDd:         6.60 cm LVIDs:         6.30 cm LV PW:         1.60 cm LV IVS:        1.20 cm LVOT diam:     1.90 cm LV SV:         23 LV SV Index:   12 LVOT Area:     2.84 cm  LEFT ATRIUM           Index LA diam:      5.20 cm 2.65 cm/m LA Vol (A4C): 53.3 ml 27.17 ml/m  AORTIC VALVE AV Area (Vmax):    0.75 cm AV Area (Vmean):   0.73 cm AV Area (VTI):     0.74 cm AV Vmax:           185.00 cm/s AV Vmean:          120.000 cm/s AV VTI:            0.315 m AV Peak Grad:      13.7 mmHg AV Mean Grad:      7.0 mmHg LVOT Vmax:         49.00 cm/s LVOT Vmean:        30.800 cm/s LVOT VTI:          0.082 m LVOT/AV VTI ratio: 0.26  AORTA Ao Root diam: 2.50 cm MITRAL VALVE MV Area (PHT): 4.71 cm    SHUNTS MV Area VTI:   0.79 cm    Systemic VTI:  0.08 m MV Peak grad:  2.2 mmHg    Systemic Diam: 1.90 cm MV Mean grad:  1.0 mmHg MV Vmax:       0.75 m/s MV Vmean:      41.7 cm/s MV Decel Time: 161 msec MV E velocity: 46.70 cm/s MV A velocity: 70.70 cm/s MV E/A ratio:  0.66 Ida Rogue MD Electronically signed by Ida Rogue MD Signature Date/Time: 07/09/2020/4:59:51 PM    Final     Assessment & Plan      1. VT arrest: -Presentedin late November or early December of last year with VT VF arrest similar to this admission. He subsequently underwent left heart cath which showed nonobstructive CAD. Amiodarone was initially held due to concern for QT prolongation. However per EPat previous admission,it was recommended to be resumed.Patient is back on an amiodarone drip. This was transiently held due  to QT prolongation as well as relative bradycardia however was restarted last p.m. Did not appear to have any further VT. Was seen by electrophysiology per [facility] MG during last admission andas per previous admission for intractable vt, will need to  consider iv lidocaine -In his current stateof probable anoxia and as per previous admission where he had persistent delerium,he is not a candidate for LifeVest and ultimately may not be a good candidate for ICD.  Poor prognosis. -Maintain potassium greater than 4.0 and magnesium greater than 2.0   2. CAD s/p PCI: -No symptoms concerning for angina -Cathpreviousadmission12/21 per Dr. Rise Patience nonobstructive disease and patent stents. No evidence of further ischemia. Likely nonischemic cardiomyopathy.   3. Chronic combined systolic and diastolic CHF: -EF remains at 20% per echo per Dr. Rockey Situ.No appreciable change from previous echo done during previous admission. We will continue to follow for improvement of his probable anoxic encephalopathy.  4. PAF: -Maintaining sinus rhythm -Continue amiodaronedrip for now. -CHA2DS2-VASc 5   5.Anoxic encephalopathy-appreciate neurology input. We will follow their guidelines.  Apparently has some evidence of brainstem activity but no purposeful response to noxious stimuli.  Very poor prognosis with multisystem failure.  We will continue to follow.  Not a candidate for further cardiac intervention.  Would continue with IV amiodarone for now.   Signed, Javier Docker Tayla Panozzo MD 07/12/2020, 8:16 AM  Pager: [phone]

## 2020-07-12 NOTE — Progress Notes (Signed)
Neurology Progress Note  Patient ID: Travis Maxwell is a 70 y.o.  male with a past medical history significant for coronary artery disease (proximal RCA stent 2019), congestive heart failure (EF 20 to 25% 01/21/2020) hypertension, diabetes, aortic valve replacement, paroxysmal atrial fibrillation with RVR (on Eliquis), mood disorder (on olanzapine), ongoing tobacco abuse, malnutrition, presented after V. fib arrest, s/p TTM, rewarming completed 5/7 AM though remains on sedation secondary to seizure activity  Initially consulted for: S/p anoxic brain injury, concern for seizure activity  Major interval events:  -No acute events overnight  Subjective: -Unresponsive  Exam: Vitals:   07/12/20 0730 07/12/20 0800  BP: 105/72 110/73  Pulse: 86 88  Resp:    Temp:    SpO2: 99% 98%   Constitutional: Chronically ill appearing Psych: Unresponsive Eyes: No scleral injection or edema, ointment on eyes appropriate for his intubation HENT: ETT in place  MSK: no joint deformities.  Cardiovascular: Normal rate and regular rhythm.  Respiratory: Breathing over the vent when rate is turned down GI: Soft.  No distension.  Skin: Warm dry and intact visible skin  Neuro - with sedation held  for 30 minutes Mental Status: Today does not have the intermittent blinking/jaw tremoring associated with prior seizure (last seen on Friday) Cranial Nerves: II: No clear blink to threat, does not have any blinking. Pupils are equal, mildly irregular, and reactive to light, sluggish, 3-> 2  mm.  Right pupil is slightly larger than the left III,IV, VI/VIII: VOR is incompletely present on the right eye only V/VII: Slight blink to corneal stimulation in the left only.  Grimaced just before coughing is symmetric in the upper face, difficult to assess in the lower face given ET tube VIII: No response to voice X/XI: Cough is present and strong today  XII: Unable to assess tongue protrusion secondary to patient's mental  status and intubation Motor/Sensory: Tone is low throughout. Bulk is normal.    No longer has myoclonic jerking of bilateral upper extremities with noxious stimulation.  No movement to noxious stimulation in all 4 extremities  Cerebellar: Unable to assess secondary to patient's mental status   Pertinent Labs: Creatinine 1.11  Leukocytosis stable as below   Post load level: Ref. Range 07/11/2020 07:19  PHENOBARBITAL Latest Ref Range: 15.0 - 30.0 ug/mL 10.8 (L)    Basic Metabolic Panel: Recent Labs  Lab 07/10/20 0337 07/10/20 0614 07/10/20 0812 07/10/20 1213 07/10/20 1622 07/11/20 0445 07/11/20 1715 07/12/20 0300  NA 135   < > 135 135 136 132*  --  137  K 4.3   < > 4.4 4.5 4.6 4.5  --  4.4  CL 112*   < > 109 110 111 108  --  110  CO2 17*  --  17* 17* 16* 17*  --  18*  GLUCOSE 196*   < > 191* 166* 130* 180*  --  190*  BUN 25*   < > 26* 24* 24* 25*  --  22  CREATININE 1.05   < > 1.35* 1.20 1.25* 1.29*  --  1.11  CALCIUM 8.1*  --  8.2* 8.2* 8.4* 7.9*  --  8.2*  MG 2.0  --  1.9  --   --  2.0 2.0 1.9  PHOS  --   --  4.3  --   --  4.1 3.0 2.3*   < > = values in this interval not displayed.   Lab Results  Component Value Date   ALT 100 (H) 07/10/2020  AST 77 (H) 07/10/2020   ALKPHOS 83 07/10/2020   BILITOT 0.7 07/10/2020   CBC: Recent Labs  Lab 07/08/20 1806 07/09/20 0124 07/10/20 0025 07/10/20 0226 07/10/20 0337 07/10/20 0614 07/11/20 0445 07/12/20 0300  WBC 38.5* 31.6*  --   --  16.7*  --  17.4* 16.7*  NEUTROABS  --   --   --   --   --   --  13.0*  --   HGB 15.4 16.3   < > 19.7* 16.1 19.4* 15.7 14.1  HCT 53.3* 56.5*   < > 58.0* 55.0* 57.0* 51.6 47.4  MCV 69.0* 69.3*  --   --  66.7*  --  65.9* 65.7*  PLT 611* 598*  --   --  683*  --  545* 599*   < > = values in this interval not displayed.    Coagulation Studies: No results for input(s): LABPROT, INR in the last 72 hours.  Description:EEG 5/6 (personally reviewed), agree with Dr. Melynda Ripple read: "Initially  showed continuous generalized 3 to 5 Hz theta-delta slowing. After propofol and Versed was stopped, EEG showed generalized periodic epileptiform discharges with overriding fast activity at 0.75-1.5Hz . EEG gradually worsened and showed generalized bursts of highly epileptiform discharges lasting 0.5 to 3 seconds alternating with brief 1 to 2-second periods of generalized background attenuation. As propofol and Versed was restarted, EEG again showed continuous generalized sharply contoured 3 to 5 Hz theta - delta slowing.Hyperventilation and photic stimulation were not performed."  Impression: Continue to have concern for significant anoxic brain injury given seizure activity when sedation was paused previously.  Today clinically does not appear to have the same concerns, though we will continue sedation given no EEG available here over the weekend.  Will titrate phenobarbital as needed to achieve therapeutic level.  If unable to be transferred to [facility] for long-term monitoring over the weekend, will recheck EEG on Monday.  Recommendations: -Continue sedation (propofol, versed) -Continue Keppra 1000 mg twice daily to be adjusted as needed for renal function  Estimated Creatinine Clearance: 65.8 mL/min (by C-G formula based on SCr of 1.11 mg/dL).   CrCl 80 to 130 mL/minute/1.73 m2: 500 mg to 1.5 g every 12 hours.  CrCl 50 to <80 mL/minute/1.73 m2: 500 mg to 1 g every 12 hours.  CrCl 30 to <50 mL/minute/1.73 m2: 250 to 750 mg every 12 hours.  CrCl 15 to <30 mL/minute/1.73 m2: 250 to 500 mg every 12 hours.  CrCl <15 mL/minute/1.73 m2: 250 to 500 mg every 24 hours (expert opinion). -s/p phenobarb 10 mg/kg loading dose, started on 1 mg/kg/day divided twice daily  -post load level 10.8, will re-load 10 mg/kg today 5/8   -Recheck level just prior to evening maintenance dose today  -Long term AC will need to be changed from apixaban to warfarin due to drug-drug interactions  -MRI brain without contrast  pending -Will plan to repeat EEG 5/9 after holding sedation for 1 to 2 hours  Brooke Dare MD-PhD [facility] [phone]  [facility] coverage for [facility] is from 8 AM to 4 AM in-house and 4 PM to 8 PM by telephone/video. 8 PM to 8 AM emergent questions or overnight urgent questions should be addressed to Teleneurology On-call or [facility] neurohospitalist; contact information can be found on AMION  CRITICAL CARE Performed by: Gordy Councilman   Total critical care time: 32 minutes  Critical care time was exclusive of separately billable procedures and treating other patients.  Critical care was  necessary to treat or prevent imminent or life-threatening deterioration.  Critical care was time spent personally by me on the following activities: development of treatment plan with patient and/or surrogate as well as nursing, discussions with consultants, evaluation of patient's response to treatment, examination of patient, obtaining history from patient or surrogate, ordering and performing treatments and interventions, ordering and review of laboratory studies, ordering and review of radiographic studies, pulse oximetry and re-evaluation of patient's condition.

## 2020-07-12 NOTE — Progress Notes (Signed)
Patient transported to MRI on transport vent with this Clinical research associate and  RN @ 1400.

## 2020-07-12 NOTE — Consult Note (Signed)
ANTICOAGULATION CONSULT NOTE   Pharmacy Consult for heparin Indication: chest pain/ACS  Not on File  Patient Measurements: Height: 5\' 10"  (177.8 cm) Weight: 75.6 kg (166 lb 10.7 oz) IBW/kg (Calculated) : 73 Heparin Dosing Weight: 78.4 kg  Vital Signs: Temp: 99.5 F (37.5 C) (05/08 1745) Temp Source: Bladder (05/08 1415) BP: 133/80 (05/08 1745) Pulse Rate: 78 (05/08 1745)  Labs: Recent Labs    07/10/20 0337 07/10/20 09/09/20 07/10/20 0812 07/10/20 1622 07/10/20 2139 07/11/20 0445 07/11/20 1127 07/11/20 1852 07/12/20 0300 07/12/20 0940 07/12/20 1700  HGB 16.1 19.4*  --   --   --  15.7  --   --  14.1  --   --   HCT 55.0* 57.0*  --   --   --  51.6  --   --  47.4  --   --   PLT 683*  --   --   --   --  545*  --   --  599*  --   --   APTT 161*  --    < >  --    < > 100*   < > 53* 59* 55* 55*  HEPARINUNFRC >1.10*  --   --   --   --  0.96*  --  0.62 0.52  --   --   CREATININE 1.05 0.90   < > 1.25*  --  1.29*  --   --  1.11  --   --    < > = values in this interval not displayed.    Estimated Creatinine Clearance: 65.8 mL/min (by C-G formula based on SCr of 1.11 mg/dL).   Medical History: History reviewed. No pertinent past medical history.  Medications:  Pt on Eliquis 5 mg BID PTA.  Assessment: 09/11/20 Travis Maxwell is a 70 y.o. male who presents to the ER with CPR in progress.  Patient reportedly was having chest pain witnessed arrest. Patient arrives with spontaneous respirations and ROSC. Pharmacy has been consulted for heparin dosing for ACS.  Note: Patient with prolonged QTc in setting of olanzapine (unable to be discontinued). Hgb: 15.4 and plts: 611 Heparin Dosing Weight: 78.4 kg   Goal of Therapy:  Heparin level 0.3-0.7 units/ml once correlating with aPTT Monitor platelets by anticoagulation protocol: Yes  Goal aPTT 66-102  05/05 0124 HL >1.10, aPTT >200, INR 1.7 05/05 2135 aPTT 63 05/06 0337 aPTT 161, HL >1.10; Per RN, lab drawn from central line, with Heparin  running in PIV line 05/06 1213 aPTT 50 05/06 2139 aPTT 79, therapeutic x 1 on 750units/hr  05/07 0445 aPTT 100, supratherapeutic, Decrease rate to 600 units/hr 05/07 1127 aPTT 56    Subtherapeutic, inc.rate to 700 unit/hr 05/07 1852 aPTT 53 Subtherapeutic 05/08 0346 aPTT 59, subtherapeutic, HL 0.59 not correlating 05/08 0940 aPTT 55 05/08 1700 aPTT 55   Plan:    Will order 1100 unit bolus   Will increase heparin infusion to 1000 units/hr.  Will recheck aPTT in 6 hours.  Due to pt being on DOAC PTA, will follow aPTT until correlation with HL.  Daily HL and CBC while on heparin.  07/08, PharmD, BCPS 07/12/2020 5:53 PM

## 2020-07-12 NOTE — Progress Notes (Signed)
  Amiodarone Drug - Drug Interaction Consult Note  Recommendations: Pt not currently taking any interacting medications. If PTA meds (atorvastatin, furosemide, metoprolol) restarted, will need to continue monitoring vitals and monitor for myopathy.  5/6 QTc= 628, 5/8 QTc= 568  cardiology aware of prolonged Qtc and EP recommending to persist with amiodarone despite this.  Patient with prolonged QTc in setting of olanzapine (unable to be discontinued).  Amiodarone is metabolized by the cytochrome P450 system and therefore has the potential to cause many drug interactions. Amiodarone has an average plasma half-life of 50 days (range 20 to 100 days).   There is potential for drug interactions to occur several weeks or months after stopping treatment and the onset of drug interactions may be slow after initiating amiodarone.   []  Statins: Increased risk of myopathy. Simvastatin-             restrict dose to 20mg  daily. Other statins: counsel patients to report any muscle pain or weakness immediately.  []  Anticoagulants: Amiodarone can increase anticoagulant effect. Consider warfarin dose reduction. Patients should be monitored closely and the dose of anticoagulant altered accordingly, remembering that amiodarone levels take several weeks to stabilize.  []  Antiepileptics: Amiodarone can increase plasma concentration of phenytoin, the dose should be reduced. Note that small changes in phenytoin dose can result in large changes in levels. Monitor patient and counsel on signs of toxicity.  []  Beta blockers: increased risk of bradycardia, AV block and myocardial depression. Sotalol - avoid concomitant use.  []   Calcium channel blockers (diltiazem and verapamil): increased risk of bradycardia, AV block and myocardial depression.  []   Cyclosporine: Amiodarone increases levels of cyclosporine. Reduced dose of cyclosporine is recommended.  []  Digoxin dose should be halved when amiodarone is  started.  []  Diuretics: increased risk of cardiotoxicity if hypokalemia occurs.  []  Oral hypoglycemic agents (glyburide, glipizide, glimepiride): increased risk of hypoglycemia. Patient's glucose levels should be monitored closely when initiating amiodarone therapy.   []  Drugs that prolong the QT interval:  Torsades de pointes risk may be increased with concurrent use - avoid if possible.  Monitor QTc, also keep magnesium/potassium WNL if concurrent therapy can't be avoided. Antibiotics: e.g. fluoroquinolones, erythromycin. . Antiarrhythmics: e.g. quinidine, procainamide, disopyramide, sotalol. . Antipsychotics: e.g. phenothiazines, haloperidol.  . Lithium, tricyclic antidepressants, and methadone  Amethyst Gainer A, PharmD 07/12/2020 10:53 AM

## 2020-07-13 ENCOUNTER — Inpatient Hospital Stay (HOSPITAL_COMMUNITY): Payer: Medicare Other

## 2020-07-13 ENCOUNTER — Inpatient Hospital Stay (HOSPITAL_COMMUNITY)
Admission: AD | Admit: 2020-07-13 | Discharge: 2020-08-05 | DRG: 308 | Disposition: E | Payer: Medicare Other | Source: Other Acute Inpatient Hospital | Attending: Internal Medicine | Admitting: Internal Medicine

## 2020-07-13 DIAGNOSIS — G939 Disorder of brain, unspecified: Secondary | ICD-10-CM | POA: Diagnosis not present

## 2020-07-13 DIAGNOSIS — Z953 Presence of xenogenic heart valve: Secondary | ICD-10-CM

## 2020-07-13 DIAGNOSIS — J690 Pneumonitis due to inhalation of food and vomit: Secondary | ICD-10-CM | POA: Diagnosis present

## 2020-07-13 DIAGNOSIS — Z79899 Other long term (current) drug therapy: Secondary | ICD-10-CM | POA: Diagnosis not present

## 2020-07-13 DIAGNOSIS — Z20822 Contact with and (suspected) exposure to covid-19: Secondary | ICD-10-CM | POA: Diagnosis present

## 2020-07-13 DIAGNOSIS — E440 Moderate protein-calorie malnutrition: Secondary | ICD-10-CM | POA: Diagnosis present

## 2020-07-13 DIAGNOSIS — Z66 Do not resuscitate: Secondary | ICD-10-CM | POA: Diagnosis present

## 2020-07-13 DIAGNOSIS — Z794 Long term (current) use of insulin: Secondary | ICD-10-CM

## 2020-07-13 DIAGNOSIS — I469 Cardiac arrest, cause unspecified: Secondary | ICD-10-CM

## 2020-07-13 DIAGNOSIS — N179 Acute kidney failure, unspecified: Secondary | ICD-10-CM | POA: Diagnosis present

## 2020-07-13 DIAGNOSIS — Z515 Encounter for palliative care: Secondary | ICD-10-CM | POA: Diagnosis not present

## 2020-07-13 DIAGNOSIS — Z955 Presence of coronary angioplasty implant and graft: Secondary | ICD-10-CM | POA: Diagnosis not present

## 2020-07-13 DIAGNOSIS — I462 Cardiac arrest due to underlying cardiac condition: Secondary | ICD-10-CM | POA: Diagnosis present

## 2020-07-13 DIAGNOSIS — E119 Type 2 diabetes mellitus without complications: Secondary | ICD-10-CM | POA: Diagnosis present

## 2020-07-13 DIAGNOSIS — I5022 Chronic systolic (congestive) heart failure: Secondary | ICD-10-CM | POA: Diagnosis present

## 2020-07-13 DIAGNOSIS — E785 Hyperlipidemia, unspecified: Secondary | ICD-10-CM | POA: Diagnosis present

## 2020-07-13 DIAGNOSIS — I480 Paroxysmal atrial fibrillation: Secondary | ICD-10-CM | POA: Diagnosis present

## 2020-07-13 DIAGNOSIS — M7989 Other specified soft tissue disorders: Secondary | ICD-10-CM

## 2020-07-13 DIAGNOSIS — I110 Hypertensive heart disease with heart failure: Secondary | ICD-10-CM | POA: Diagnosis present

## 2020-07-13 DIAGNOSIS — I2510 Atherosclerotic heart disease of native coronary artery without angina pectoris: Secondary | ICD-10-CM | POA: Diagnosis present

## 2020-07-13 DIAGNOSIS — G9341 Metabolic encephalopathy: Secondary | ICD-10-CM | POA: Diagnosis present

## 2020-07-13 DIAGNOSIS — Z978 Presence of other specified devices: Secondary | ICD-10-CM | POA: Diagnosis not present

## 2020-07-13 DIAGNOSIS — J9601 Acute respiratory failure with hypoxia: Secondary | ICD-10-CM

## 2020-07-13 DIAGNOSIS — G931 Anoxic brain damage, not elsewhere classified: Secondary | ICD-10-CM | POA: Diagnosis present

## 2020-07-13 DIAGNOSIS — I4901 Ventricular fibrillation: Secondary | ICD-10-CM | POA: Diagnosis present

## 2020-07-13 DIAGNOSIS — Z6826 Body mass index (BMI) 26.0-26.9, adult: Secondary | ICD-10-CM | POA: Diagnosis not present

## 2020-07-13 DIAGNOSIS — J9602 Acute respiratory failure with hypercapnia: Secondary | ICD-10-CM | POA: Diagnosis present

## 2020-07-13 LAB — COMPREHENSIVE METABOLIC PANEL
ALT: 34 U/L (ref 0–44)
AST: 32 U/L (ref 15–41)
Albumin: 2.6 g/dL — ABNORMAL LOW (ref 3.5–5.0)
Alkaline Phosphatase: 72 U/L (ref 38–126)
Anion gap: 5 (ref 5–15)
BUN: 21 mg/dL (ref 8–23)
CO2: 23 mmol/L (ref 22–32)
Calcium: 8.4 mg/dL — ABNORMAL LOW (ref 8.9–10.3)
Chloride: 110 mmol/L (ref 98–111)
Creatinine, Ser: 1 mg/dL (ref 0.61–1.24)
GFR, Estimated: >60 mL/min (ref >60)
Glucose, Bld: 177 mg/dL — ABNORMAL HIGH (ref 70–99)
Potassium: 4.1 mmol/L (ref 3.5–5.1)
Sodium: 138 mmol/L (ref 135–145)
Total Bilirubin: 0.8 mg/dL (ref 0.3–1.2)
Total Protein: 6.1 g/dL — ABNORMAL LOW (ref 6.5–8.1)

## 2020-07-13 LAB — HIV ANTIBODY (ROUTINE TESTING W REFLEX): HIV Screen 4th Generation wRfx: NONREACTIVE

## 2020-07-13 LAB — COOXEMETRY PANEL
Carboxyhemoglobin: 1.2 % (ref 0.5–1.5)
Methemoglobin: 0.9 % (ref 0.0–1.5)
O2 Saturation: 71.1 %
Total hemoglobin: 13.5 g/dL (ref 12.0–16.0)

## 2020-07-13 LAB — CBC WITH DIFFERENTIAL/PLATELET
Abs Immature Granulocytes: 0.14 10*3/uL — ABNORMAL HIGH (ref 0.00–0.07)
Basophils Absolute: 0.2 10*3/uL — ABNORMAL HIGH (ref 0.0–0.1)
Basophils Relative: 1 %
Eosinophils Absolute: 0.6 10*3/uL — ABNORMAL HIGH (ref 0.0–0.5)
Eosinophils Relative: 4 %
HCT: 40.9 % (ref 39.0–52.0)
Hemoglobin: 12.3 g/dL — ABNORMAL LOW (ref 13.0–17.0)
Immature Granulocytes: 1 %
Lymphocytes Relative: 17 %
Lymphs Abs: 2.6 10*3/uL (ref 0.7–4.0)
MCH: 19.7 pg — ABNORMAL LOW (ref 26.0–34.0)
MCHC: 30.1 g/dL (ref 30.0–36.0)
MCV: 65.4 fL — ABNORMAL LOW (ref 80.0–100.0)
Monocytes Absolute: 1.6 10*3/uL — ABNORMAL HIGH (ref 0.1–1.0)
Monocytes Relative: 10 %
Neutro Abs: 10.3 10*3/uL — ABNORMAL HIGH (ref 1.7–7.7)
Neutrophils Relative %: 67 %
Platelets: 440 10*3/uL — ABNORMAL HIGH (ref 150–400)
RBC: 6.25 MIL/uL — ABNORMAL HIGH (ref 4.22–5.81)
RDW: 25.3 % — ABNORMAL HIGH (ref 11.5–15.5)
WBC: 15.4 10*3/uL — ABNORMAL HIGH (ref 4.0–10.5)
nRBC: 0 % (ref 0.0–0.2)

## 2020-07-13 LAB — CULTURE, BLOOD (ROUTINE X 2)
Culture: NO GROWTH
Culture: NO GROWTH
Special Requests: ADEQUATE
Special Requests: ADEQUATE

## 2020-07-13 LAB — GLUCOSE, CAPILLARY
Glucose-Capillary: 145 mg/dL — ABNORMAL HIGH (ref 70–99)
Glucose-Capillary: 148 mg/dL — ABNORMAL HIGH (ref 70–99)
Glucose-Capillary: 149 mg/dL — ABNORMAL HIGH (ref 70–99)
Glucose-Capillary: 155 mg/dL — ABNORMAL HIGH (ref 70–99)
Glucose-Capillary: 174 mg/dL — ABNORMAL HIGH (ref 70–99)
Glucose-Capillary: 187 mg/dL — ABNORMAL HIGH (ref 70–99)
Glucose-Capillary: 230 mg/dL — ABNORMAL HIGH (ref 70–99)

## 2020-07-13 LAB — MAGNESIUM: Magnesium: 1.9 mg/dL (ref 1.7–2.4)

## 2020-07-13 LAB — APTT
aPTT: 60 seconds — ABNORMAL HIGH (ref 24–36)
aPTT: 57 seconds — ABNORMAL HIGH (ref 24–36)

## 2020-07-13 LAB — HEPARIN LEVEL (UNFRACTIONATED)
Heparin Unfractionated: 0.26 IU/mL — ABNORMAL LOW (ref 0.30–0.70)
Heparin Unfractionated: 0.21 IU/mL — ABNORMAL LOW (ref 0.30–0.70)

## 2020-07-13 LAB — PHOSPHORUS: Phosphorus: 2.3 mg/dL — ABNORMAL LOW (ref 2.5–4.6)

## 2020-07-13 MED ORDER — FAMOTIDINE 20 MG PO TABS
20.0000 mg | ORAL_TABLET | Freq: Two times a day (BID) | ORAL | Status: DC
  Administered 2020-07-14: 20 mg
  Filled 2020-07-13: qty 1

## 2020-07-13 MED ORDER — LEVETIRACETAM IN NACL 1000 MG/100ML IV SOLN
1000.0000 mg | Freq: Two times a day (BID) | INTRAVENOUS | Status: DC
  Filled 2020-07-13 (×2): qty 100

## 2020-07-13 MED ORDER — DOCUSATE SODIUM 100 MG PO CAPS: 100.0000 mg | ORAL_CAPSULE | Freq: Two times a day (BID) | ORAL | Status: DC | PRN

## 2020-07-13 MED ORDER — INSULIN ASPART 100 UNIT/ML IJ SOLN
0.0000 [IU] | INTRAMUSCULAR | Status: DC
  Administered 2020-07-13 (×2): 2 [IU] via SUBCUTANEOUS
  Administered 2020-07-13 – 2020-07-14 (×2): 3 [IU] via SUBCUTANEOUS
  Administered 2020-07-14: 2 [IU] via SUBCUTANEOUS
  Administered 2020-07-14: 3 [IU] via SUBCUTANEOUS
  Administered 2020-07-14 (×2): 2 [IU] via SUBCUTANEOUS
  Administered 2020-07-15: 3 [IU] via SUBCUTANEOUS
  Administered 2020-07-15 (×2): 5 [IU] via SUBCUTANEOUS
  Administered 2020-07-15: 3 [IU] via SUBCUTANEOUS
  Administered 2020-07-15: 5 [IU] via SUBCUTANEOUS
  Administered 2020-07-15: 3 [IU] via SUBCUTANEOUS
  Administered 2020-07-16: 5 [IU] via SUBCUTANEOUS
  Administered 2020-07-16 (×2): 3 [IU] via SUBCUTANEOUS

## 2020-07-13 MED ORDER — MAGNESIUM SULFATE 2 GM/50ML IV SOLN
2.0000 g | Freq: Once | INTRAVENOUS | Status: AC
Start: 2020-07-13 — End: 2020-07-13
  Administered 2020-07-13: 2 g via INTRAVENOUS
  Filled 2020-07-13: qty 50

## 2020-07-13 MED ORDER — HEPARIN BOLUS VIA INFUSION: 2000.0000 [IU] | Freq: Once | INTRAVENOUS | 0 refills | Status: DC

## 2020-07-13 MED ORDER — AMIODARONE HCL IN DEXTROSE 360-4.14 MG/200ML-% IV SOLN
30.0000 mg/h | INTRAVENOUS | Status: DC
  Administered 2020-07-13 – 2020-07-16 (×6): 30 mg/h via INTRAVENOUS
  Filled 2020-07-13 (×5): qty 200

## 2020-07-13 MED ORDER — SODIUM PHOSPHATES 45 MMOLE/15ML IV SOLN: 15.0000 mmol | Freq: Once | INTRAVENOUS | Status: DC

## 2020-07-13 MED ORDER — HEPARIN (PORCINE) 25000 UT/250ML-% IV SOLN
1650.0000 [IU]/h | INTRAVENOUS | Status: DC
  Administered 2020-07-14: 1350 [IU]/h via INTRAVENOUS
  Administered 2020-07-15: 1650 [IU]/h via INTRAVENOUS
  Filled 2020-07-13 (×4): qty 250

## 2020-07-13 MED ORDER — MAGNESIUM SULFATE 2 GM/50ML IV SOLN
2.0000 g | Freq: Once | INTRAVENOUS | Status: AC
  Administered 2020-07-13: 2 g via INTRAVENOUS
  Filled 2020-07-13: qty 50

## 2020-07-13 MED ORDER — ORAL CARE MOUTH RINSE
15.0000 mL | OROMUCOSAL | Status: DC
  Administered 2020-07-13 – 2020-07-16 (×27): 15 mL via OROMUCOSAL

## 2020-07-13 MED ORDER — GLYCOPYRROLATE 0.2 MG/ML IJ SOLN
0.1000 mg | Freq: Once | INTRAMUSCULAR | Status: AC
  Administered 2020-07-13: 0.1 mg via INTRAVENOUS
  Filled 2020-07-13: qty 1

## 2020-07-13 MED ORDER — PHENOBARBITAL SODIUM 65 MG/ML IJ SOLN
1.0000 mg/kg | Freq: Two times a day (BID) | INTRAMUSCULAR | Status: DC
  Administered 2020-07-13 – 2020-07-16 (×6): 78 mg via INTRAVENOUS
  Filled 2020-07-13 (×6): qty 2

## 2020-07-13 MED ORDER — POTASSIUM PHOSPHATES 15 MMOLE/5ML IV SOLN
30.0000 mmol | Freq: Once | INTRAVENOUS | Status: AC
  Administered 2020-07-13: 30 mmol via INTRAVENOUS
  Filled 2020-07-13: qty 10

## 2020-07-13 MED ORDER — HEPARIN BOLUS VIA INFUSION
2000.0000 [IU] | Freq: Once | INTRAVENOUS | Status: AC
  Administered 2020-07-13: 2000 [IU] via INTRAVENOUS
  Filled 2020-07-13: qty 2000

## 2020-07-13 MED ORDER — SODIUM CHLORIDE 0.9 % IV SOLN
INTRAVENOUS | Status: DC | PRN
  Administered 2020-07-13: 500 mL via INTRAVENOUS

## 2020-07-13 MED ORDER — FAMOTIDINE 20 MG PO TABS
20.0000 mg | ORAL_TABLET | Freq: Two times a day (BID) | ORAL | Status: DC
  Administered 2020-07-13: 20 mg via ORAL
  Filled 2020-07-13: qty 1

## 2020-07-13 MED ORDER — LEVETIRACETAM IN NACL 1000 MG/100ML IV SOLN
1000.0000 mg | Freq: Two times a day (BID) | INTRAVENOUS | Status: DC
  Administered 2020-07-13 – 2020-07-16 (×6): 1000 mg via INTRAVENOUS
  Filled 2020-07-13 (×6): qty 100

## 2020-07-13 MED ORDER — PHENOBARBITAL SODIUM 65 MG/ML IJ SOLN: 1.0000 mg/kg/d | Freq: Two times a day (BID) | INTRAMUSCULAR | Status: DC

## 2020-07-13 MED ORDER — HEPARIN BOLUS VIA INFUSION
1000.0000 [IU] | INTRAVENOUS | Status: AC
  Administered 2020-07-13: 1000 [IU] via INTRAVENOUS
  Filled 2020-07-13: qty 1000

## 2020-07-13 MED ORDER — POLYETHYLENE GLYCOL 3350 17 G PO PACK: 17.0000 g | PACK | Freq: Every day | ORAL | Status: DC | PRN

## 2020-07-13 MED ORDER — LEVETIRACETAM IN NACL 1500 MG/100ML IV SOLN: 1500.0000 mg | Freq: Two times a day (BID) | INTRAVENOUS | Status: DC

## 2020-07-13 MED ORDER — SODIUM PHOSPHATES 45 MMOLE/15ML IV SOLN
15.0000 mmol | Freq: Once | INTRAVENOUS | Status: DC
  Administered 2020-07-13: 15 mmol via INTRAVENOUS
  Filled 2020-07-13: qty 5

## 2020-07-13 MED ORDER — CHLORHEXIDINE GLUCONATE 0.12% ORAL RINSE (MEDLINE KIT)
15.0000 mL | Freq: Two times a day (BID) | OROMUCOSAL | Status: DC
  Administered 2020-07-13 – 2020-07-16 (×6): 15 mL via OROMUCOSAL

## 2020-07-13 MED ORDER — DOCUSATE SODIUM 50 MG/5ML PO LIQD: 100.0000 mg | Freq: Two times a day (BID) | ORAL | Status: DC | PRN

## 2020-07-13 MED ORDER — SODIUM CHLORIDE 0.9 % IV SOLN
INTRAVENOUS | Status: DC | PRN
  Administered 2020-07-13 – 2020-07-15 (×2): 250 mL via INTRAVENOUS
  Administered 2020-07-16: 500 mL via INTRAVENOUS

## 2020-07-13 NOTE — Progress Notes (Signed)
LTM in progress, result pending.

## 2020-07-13 NOTE — Progress Notes (Signed)
1630:  Patient arrived from [place] via [facility].  Patient is unresponsive. CCM at bedside to evaluate patient.  All sedation D/C per CCM.  Right IJ, Femoral arterial line, and Foley D/C per CCM.  RN to continue to monitor.

## 2020-07-13 NOTE — Consult Note (Addendum)
ANTICOAGULATION CONSULT NOTE   Pharmacy Consult for heparin Indication: chest pain/ACS  Not on File  Patient Measurements: Height: 5\' 10"  (177.8 cm) Weight: 80.5 kg (177 lb 7.5 oz) IBW/kg (Calculated) : 73 Heparin Dosing Weight: 78.4 kg  Vital Signs: Temp: 99.68 F (37.6 C) (05/09 1100) Temp Source: Bladder (05/09 0400) BP: 121/65 (05/09 1100) Pulse Rate: 84 (05/09 1100)  Labs: Recent Labs    07/11/20 0445 07/11/20 1127 07/11/20 1852 07/12/20 0300 07/12/20 0940 07/12/20 1700 07/19/2020 0000 07/19/20 0433 07-19-2020 1125  HGB 15.7  --   --  14.1  --   --   --  12.3*  --   HCT 51.6  --   --  47.4  --   --   --  40.9  --   PLT 545*  --   --  599*  --   --   --  440*  --   APTT 100*   < > 53* 59*   < > 55* 60*  --  57*  HEPARINUNFRC 0.96*  --  0.62 0.52  --   --   --  0.26*  --   CREATININE 1.29*  --   --  1.11  --   --   --  1.00  --    < > = values in this interval not displayed.    Estimated Creatinine Clearance: 73 mL/min (by C-G formula based on SCr of 1 mg/dL).   Medical History: History reviewed. No pertinent past medical history.  Medications:  Pt on Eliquis 5 mg BID PTA.  Assessment: 09/12/20 Travis Maxwell is a 70 y.o. male who presents to the ER with CPR in progress.  Patient reportedly was having chest pain witnessed arrest. Patient arrives with spontaneous respirations and ROSC. Pharmacy has been consulted for heparin dosing for ACS.  Note: Patient with prolonged QTc in setting of olanzapine (unable to be discontinued). Hgb: 15.4 and plts: 611 Heparin Dosing Weight: 78.4 kg   Goal of Therapy:  Heparin level 0.3-0.7 units/ml once correlating with aPTT Monitor platelets by anticoagulation protocol: Yes  Goal aPTT 66-102  05/05 0124 HL >1.10, aPTT >200, INR 1.7 05/05 2135 aPTT 63 05/06 0337 aPTT 161, HL >1.10; Per RN, lab drawn from central line, with Heparin running in PIV line 05/06 1213 aPTT 50 05/06 2139 aPTT 79, therapeutic x 1 on 750units/hr  05/07  0445 aPTT 100, supratherapeutic, Decrease rate to 600 units/hr 05/07 1127 aPTT 56    Subtherapeutic, inc.rate to 700 unit/hr 05/07 1852 aPTT 53 Subtherapeutic 05/08 0346 aPTT 59, subtherapeutic, HL 0.59 not correlating 05/08 0940 aPTT 55, subtherapeutic  05/08 1700 aPTT 55, subtherapeutic 05/09 0000 aPTT 60 (lab not drawn until after 0200), HL 0.26 05/09 1125 aPTT 57, subtherapeutic   Plan:    aPTT 57, subtherapeutic - will bolus with 2000 units and increase heparin infusion to 1250 units/hr.  HL and aPTT from 5/9 this AM both subtherapeutic and seems to be correlating, will monitor HL going forward.  Check HL in 6 hours from rate change   Daily HL and CBC while on heparin.  7/9, PharmD Pharmacy Resident  07-19-20 12:02 PM

## 2020-07-13 NOTE — Progress Notes (Signed)
GOALS OF CARE DISCUSSION  The Clinical status was relayed to family in detail. Daughter  Sink over the phone  Updated and notified of patients medical condition.    Patient remains unresponsive and will not open eyes to command.   Patient is having a weak cough and struggling to remove secretions.   Patient with increased WOB and using accessory muscles to breathe Explained to family course of therapy and the modalities  Patient with signs of brain damage  Patient with Progressive multiorgan failure with a very high probablity of a very minimal chance of meaningful recovery despite all aggressive and optimal medical therapy.  PATIENT REMAINS FULL CODE  Family understands the situation. She will reach out to family to discuss DNR status.   Family are satisfied with Plan of action and management. All questions answered  Additional CC time 35 mins   Drexler Maland Santiago Glad, M.D.  [facility]  Medical Director [facility] Medical Director [facility] Cardio-Pulmonary Department

## 2020-07-13 NOTE — Discharge Summary (Signed)
Physician Discharge Summary  Patient ID: Narek Kniss MRN: [id number] DOB/AGE: 12-Nov-1950 70 y.o.  Admit date: 07/08/2020 Discharge date: 07/14/2020  Admission Diagnoses: ACUTE CARDIAC ARREST  Discharge Diagnoses:  Active Problems:   Cardiac arrest (HCC)   Malnutrition of moderate degree   Acute respiratory failure [facility])   Discharged Condition:critical  Hospital Course:  70 year old male arrived to the ED s/p out of hospital V-fib Cardiac arrest. Patient had complained of feeling hot with chest discomfort and found unresponsive by brother who started CPR. EMS arrived and patient reported in V.fib s/p shock x 2 and epi x 2, amiodarone and ROSC was achieved. In the ED patient had ventricular arrhythmia requiring shock x 2. Cardiology evaluated the patient, no emergent plans for cardiac cath at this time per report. PCCM admitted.   Of note, patient has prior hospitalization for cardiac arrest in 02/2020 for cardiogenic shock witth VF/VT. Patient with prolonged QTc in setting of olanzapine (unable to be discontinued).  Pertinent Medical History  HTN, T2DM, HLD, GERD, CAD (s/p PC toRCAI), AVR with bioprosthetic valve, systolic CHF with EF of 89%, and paroxysmal A. Fib  Significant Hospital Events: Including procedures, antibiotic start and stop dates in addition to other pertinent events    5/4 Admit to ICU for cardiac arrest. Cooled and paralyzed  5/5 Paralytics discontinued. EEG suggestive of anoxic brain injury with myoclonic seizure and generalized epileptogenicity. Central line placed for IV access  5/6 rewarming started at midnight  5/7 he is in maintenance of normothermia phase of targeted temperature management. Remains on mechanical ventilation with low requirements. He does not have significant vasopressor needs.    Consults: NEUROLOGY  Significant Diagnostic Studies: MRI BRAIN  Treatments: 70 year old male arrived to the ED s/p out of hospital V-fib Cardiac  arrest  With multiorgan failure  and signs and symptoms of brain damage  Severe ACUTE Hypoxic and Hypercapnic Respiratory Failure -continue Mechanical Ventilator support -continue Bronchodilator Therapy -Wean Fio2 and PEEP as tolerated -VAP/VENT bundle implementation   CARDIAC FAILURE- acute Systolic -oxygen as needed -Lasix as tolerated -follow up cardiac enzymes as indicated  CARDIAC ICU monitoring   ACUTE KIDNEY INJURY/Renal Failure -continue Foley Catheter-assess need -Avoid nephrotoxic agents -Follow urine output, BMP -Ensure adequate renal perfusion, optimize oxygenation -Renal dose medications   NEUROLOGY Acute toxic metabolic encephalopathy, need for sedation Goal RASS -2 to -3 Signs and symptoms of brain damage Await NEURO recs   CARDIOGENIC SHOCK -use vasopressors to keep MAP>65 as needed   ENDO - ICU hypoglycemic\Hyperglycemia protocol -check FSBS per protocol   GI GI PROPHYLAXIS as indicated  NUTRITIONAL STATUS DIET-->TF's as tolerated Constipation protocol as indicated   ELECTROLYTES -follow labs as needed -replace as needed -pharmacy consultation and following    Best practice (right click and "Reselect all SmartList Selections" daily)  Diet: Start tube feeds Pain/Anxiety/Delirium protocol (if indicated): Yes (RASS goal -1) VAP protocol (if indicated): Yes DVT prophylaxis: Systemic AC GI prophylaxis: PPI Glucose control: SSI Yes Central venous access: N/A Arterial line: N/A Foley: Yes, and it is still needed Mobility: bed rest PT consulted: N/A Last date of multidisciplinary goals of care discussion[5/6] Confirmed full code status. Dr Orpah Melter spoke with daughter Velva Harman on 07/10/20 and she thinks patient would want to live at all costs. She thinks he would have been okay with trach and PEG if he should need it.  Code Status: full code Disposition:ICU     Discharge Exam: Blood pressure 121/65,  pulse 84, temperature 99.68 F (  37.6 C), resp. rate (!) 24, height 5' 10"  (1.778 m), weight 80.5 kg, SpO2 97 %.  PHYSICAL EXAMINATION:  GENERAL:critically ill appearing, +resp distress HEAD: Normocephalic, atraumatic.  EYES: Pupils equal, round, reactive to light.  No scleral icterus.  MOUTH: Moist mucosal membrane. NECK: Supple. No thyromegaly. No nodules. No JVD.  PULMONARY: +rhonchi, +wheezing CARDIOVASCULAR: S1 and S2. Regular rate and rhythm. No murmurs, rubs, or gallops.  GASTROINTESTINAL: Soft, nontender, -distended. Positive bowel sounds.  MUSCULOSKELETAL: No swelling, clubbing, or edema.  NEUROLOGIC: obtunded SKIN:intact,warm,dry    Disposition: Discharge disposition: [place] Not Defined        Allergies as of 07/31/2020   Not on File     Medication List    STOP taking these medications   allopurinol 300 MG tablet Commonly known as: ZYLOPRIM   atorvastatin 40 MG tablet Commonly known as: LIPITOR   Eliquis 5 MG Tabs tablet Generic drug: apixaban   furosemide 20 MG tablet Commonly known as: LASIX   Januvia 100 MG tablet Generic drug: sitaGLIPtin   metoprolol succinate 25 MG 24 hr tablet Commonly known as: TOPROL-XL   OLANZapine 5 MG tablet Commonly known as: ZYPREXA   spironolactone 25 MG tablet Commonly known as: ALDACTONE     TAKE these medications   amiodarone 360-4.14 MG/200ML-% Soln Commonly known as: NEXTERONE PREMIX Inject 30 mg/hr into the vein continuous.   artificial tears Oint ophthalmic ointment Commonly known as: LACRILUBE Place 1 application into both eyes every 8 (eight) hours.   chlorhexidine gluconate (MEDLINE KIT) 0.12 % solution Commonly known as: PERIDEX 15 mLs by Mouth Rinse route 2 (two) times daily.   Chlorhexidine Gluconate Cloth 2 % Pads Apply 6 each topically daily.   dextrose 10 % infusion Inject 30 mL/hr into the vein continuous.   feeding supplement (PROSource TF) liquid Place  45 mLs into feeding tube 2 (two) times daily.   feeding supplement (VITAL AF 1.2 CAL) Liqd Place 1,000 mLs into feeding tube continuous.   fentaNYL 10 mcg/ml Soln infusion Inject 100-300 mcg/hr into the vein continuous.   fentaNYL Soln Commonly known as: SUBLIMAZE Inject 25 mcg into the vein every 30 (thirty) minutes as needed.   free water Soln Place 30 mLs into feeding tube every 4 (four) hours.   heparin 100 units/mL Soln Inject 2,000 Units into the vein once for 1 dose.   heparin 25000 UT/250ML infusion Inject 750 Units/hr into the vein continuous.   insulin aspart 100 UNIT/ML injection Commonly known as: novoLOG Inject 0-20 Units into the skin every 4 (four) hours.   levETIRAcetam 1000 MG/100ML Soln Commonly known as: KEPPRA Inject 100 mLs (1,000 mg total) into the vein every 12 (twelve) hours.   MIDAZOLAM 50MG/50ML (1MG/ML) PREMIX INFUSION Inject 0.5-10 mg/hr into the vein continuous.   norepinephrine 4-5 MG/250ML-% Soln Commonly known as: LEVOPHED Inject 2-10 mcg/min into the vein continuous.   pantoprazole 40 MG injection Commonly known as: PROTONIX Inject 40 mg into the vein at bedtime.   PHENObarbital 65 MG/ML injection Commonly known as: LUMINAL Inject 0.6 mLs (39 mg total) into the vein 2 (two) times daily.   propofol 1000 MG/100ML Emul injection Commonly known as: DIPRIVAN Inject 392-6,272 mcg/min into the vein continuous.   sodium chloride 0.9 % infusion Inject 250 mLs into the vein continuous.   sodium phosphate 15 mmol in dextrose 5 % 250 mL Inject 15 mmol into the vein once for 1 dose.        Signed: Flora Lipps  07/18/2020, 12:34 PM

## 2020-07-13 NOTE — Consult Note (Signed)
ANTICOAGULATION CONSULT NOTE   Pharmacy Consult for heparin Indication: chest pain/ACS  Not on File  Patient Measurements: Height: 5\' 10"  (177.8 cm) Weight: 75.6 kg (166 lb 10.7 oz) IBW/kg (Calculated) : 73 Heparin Dosing Weight: 78.4 kg  Vital Signs: Temp: 100.4 F (38 C) (05/09 0000) BP: 110/61 (05/09 0000) Pulse Rate: 85 (05/09 0000)  Labs: Recent Labs    07/10/20 0337 07/10/20 09/09/20 07/10/20 0812 07/10/20 1622 07/10/20 2139 07/11/20 0445 07/11/20 1127 07/11/20 1852 07/12/20 0300 07/12/20 0940 07/12/20 1700 08-09-2020 0000  HGB 16.1 19.4*  --   --   --  15.7  --   --  14.1  --   --   --   HCT 55.0* 57.0*  --   --   --  51.6  --   --  47.4  --   --   --   PLT 683*  --   --   --   --  545*  --   --  599*  --   --   --   APTT 161*  --    < >  --    < > 100*   < > 53* 59* 55* 55* 60*  HEPARINUNFRC >1.10*  --   --   --   --  0.96*  --  0.62 0.52  --   --   --   CREATININE 1.05 0.90   < > 1.25*  --  1.29*  --   --  1.11  --   --   --    < > = values in this interval not displayed.    Estimated Creatinine Clearance: 65.8 mL/min (by C-G formula based on SCr of 1.11 mg/dL).   Medical History: History reviewed. No pertinent past medical history.  Medications:  Pt on Eliquis 5 mg BID PTA.  Assessment: 09/12/20 Doe is a 70 y.o. male who presents to the ER with CPR in progress.  Patient reportedly was having chest pain witnessed arrest. Patient arrives with spontaneous respirations and ROSC. Pharmacy has been consulted for heparin dosing for ACS.  Note: Patient with prolonged QTc in setting of olanzapine (unable to be discontinued). Hgb: 15.4 and plts: 611 Heparin Dosing Weight: 78.4 kg   Goal of Therapy:  Heparin level 0.3-0.7 units/ml once correlating with aPTT Monitor platelets by anticoagulation protocol: Yes  Goal aPTT 66-102  05/05 0124 HL >1.10, aPTT >200, INR 1.7 05/05 2135 aPTT 63 05/06 0337 aPTT 161, HL >1.10; Per RN, lab drawn from central line, with  Heparin running in PIV line 05/06 1213 aPTT 50 05/06 2139 aPTT 79, therapeutic x 1 on 750units/hr  05/07 0445 aPTT 100, supratherapeutic, Decrease rate to 600 units/hr 05/07 1127 aPTT 56    Subtherapeutic, inc.rate to 700 unit/hr 05/07 1852 aPTT 53 Subtherapeutic 05/08 0346 aPTT 59, subtherapeutic, HL 0.59 not correlating 05/08 0940 aPTT 55 05/08 1700 aPTT 55 05/09 0000 aPTT 60 (lab not drawn until after 0200)   Plan:    Will order 1100 unit bolus   Will increase heparin infusion to 1100 units/hr.  Will recheck aPTT in 6 hours.  Due to pt being on DOAC PTA, will follow aPTT until correlation with HL.  Daily HL and CBC while on heparin.  07/09, PharmD, [facility] 2020-08-09 3:33 AM

## 2020-07-13 NOTE — Procedures (Signed)
   Patient Name: Travis Maxwell  MRN: [id number]  Attending: Malachi Carl. Selina Cooley MD Referring Physician/Provider: Dr. Brooke Dare Date: 07/10/2020 Duration: 45:06  Routine EEG Report   Patient history: 70 year old male status post V. fib arrest c/b myoclonic seizures.  EEG to evaluate seizures.   Level of alertness: Comatose   AEDs during EEG study: LEV, PHB. (Propofol and versed were paused 60 min prior to EEG)   Technical aspects: This EEG study was done with scalp electrodes positioned according to the 10-20 International system of electrode placement. Electrical activity was acquired at a sampling rate of 500Hz  and reviewed with a high frequency filter of 70Hz  and a low frequency filter of 1Hz . EEG data were recorded continuously and digitally stored.    Description: EEG  for majority of recording showed generalized periodic discharges at 0.75-1.5 Hz. Rarely continuous background could be observed consisting of moderate slowing with overriding fast activity. These were rare, were not typically generalized, but had no pattern to their focality I could appreciate. Hyperventilation and photic stimulation were not performed.      ABNORMALITY - Attenuated background with GPDs throughout the recording at 0.75-1.5 Hz - Brief runs of focal higher-amplitude continuous background in multiple locations without clear predilection or pattern  - No A-P gradient - No sleep architecture   IMPRESSION: This study showed evidence of generalized epileptogenicity with high potential for seizures as well as profound diffuse encephalopathy. In the setting of cardiac arrest, this EEG is suggestive of anoxic/hypoxic brain injury.  Patient will be transferred to [facility] today for continuous EEG.

## 2020-07-13 NOTE — Progress Notes (Signed)
[facility] just left the unit with the patient.

## 2020-07-13 NOTE — H&P (Signed)
NAME:  Travis Maxwell, MRN:  [id number], DOB:  1950/07/15, LOS: 0 ADMISSION DATE:  07/05/2020, CONSULTATION DATE: 07/29/2020 REFERRING MD: Dr. Mortimer Fries from [facility] [place]: Status post cardiac arrest, unresponsiveness  History of Present Illness:  Patient is unresponsive so most of the history is taken from chart review 70 year old male with prior history of cardiac arrest in December 2022 with VF/VT caused by prolonged QT in the setting of olanzapine, he presented to [facility] emergency department on 5/4 after he had V. fib cardiac arrest status post defibrillation x2 and epi x2. During his stay at [facility], patient had targeted therapeutic hypothermia, he was rewarmed on 5/6.  MRI brain was done which showed bilateral caudate nuclei involvement could be due to hypoxic/anoxic brain injury, EEG was done which was consistent with moderate to severe encephalopathy.  Patient was transferred to [facility] for further evaluation and continuous EEG monitoring as patient remained unresponsive  Pertinent  Medical History  Hypertension, diabetes type 2 Hyperlipidemia Coronary artery disease status post PCI to RCA Status post bioprosthetic aortic valve replacement Systolic congestive heart failure, EF 20% Proximal A. fib  Significant Hospital Events: Including procedures, antibiotic start and stop dates in addition to other pertinent events   . 5/9, transferred from [facility]  Interim History / Subjective:    Objective   Pulse 86, resp. rate (!) 25, SpO2 100 %.    Vent Mode: PRVC FiO2 (%):  [28 %-50 %] 50 % Set Rate:  [24 bmp] 24 bmp Vt Set:  [500 mL-580 mL] 580 mL PEEP:  [5 cmH20] 5 cmH20 Plateau Pressure:  [17 cmH20-18 cmH20] 18 cmH20  No intake or output data in the 24 hours ending 08/02/2020 1620 There were no vitals filed for this visit.  Examination:   Physical exam: General: Acute on chronically ill-appearing male, orally intubated HEENT: [place]/AT, eyes  anicteric.  ETT and OGT in place.  Ulcer was noted on anterior part of the tongue (POA) Neuro: Eyes closed, does not open pupils 2 mm bilateral sluggishly reactive on right side, positive corneal, cough and gag, no motor response to painful stimuli Chest: Coarse breath sounds, no wheezes or rhonchi Heart: Regular rate and rhythm, no murmurs or gallops Abdomen: Soft, nontender, nondistended, bowel sounds present Skin: No rash   Labs/imaging that I havepersonally reviewed  (right click and "Reselect all SmartList Selections" daily)  MRI brain 5/8: Low level restricted diffusion within the caudate nuclei consistent with hypoxic ischemic injury. Question lower level involvement of both thalami and both hippocampi. No gross brain swelling. No Hemorrhage. Old ischemic changes elsewhere throughout the brain   ABGs: 7.2 9/37/104/97% Chemistry: Sodium 138, creatinine 1.0, phosphorus 2.3, magnesium 1.9 CBC: WBC 15.4 Hb 12.3, platelet 440  Resolved Hospital Problem list     Assessment & Plan:  Status post V. fib/V. 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 Fib   Continue IV amiodarone infusion Patient is status post TTM Echocardiogram showed EF 20% with global hypokinesis MRI brain showed bilateral caudate nuclei signals could be related to hypoxic/anoxic brain injury Will obtain continuous video EEG Continue Keppra and phenobarbital Avoid sedation with RASS goal 0/-1 Neurology input is appreciated Monitor intake and output Monitor serum creatinine and electrolytes Continue aggressive electrolyte supplement Patient is currently in sinus rhythm, continue telemetry monitoring Continue IV heparin  infusion for stroke prophylaxis  due to high CHA2DS2-VASc score  Best practice (right click and "Reselect all SmartList Selections" daily)  Diet:  NPO Pain/Anxiety/Delirium protocol (if indicated): No VAP protocol (if indicated): Yes DVT prophylaxis: Systemic AC GI prophylaxis: H2B Glucose control:  SSI Yes Central venous access:  Yes, and it is no longer needed Arterial line:  Yes, and it is no longer needed Foley:  Yes, and it is no longer needed Mobility:  bed rest  PT consulted: N/A Last date of multidisciplinary goals of care discussion [5/9: Spoke with patient's daughter, she would like to continue aggressive medical care] Code Status:  full code Disposition: ICU  Labs   CBC: Recent Labs  Lab 07/09/20 0124 07/10/20 0025 07/10/20 0337 07/10/20 0614 07/11/20 0445 07/12/20 0300 07/28/2020 0433  WBC 31.6*  --  16.7*  --  17.4* 16.7* 15.4*  NEUTROABS  --   --   --   --  13.0*  --  10.3*  HGB 16.3   < > 16.1 19.4* 15.7 14.1 12.3*  HCT 56.5*   < > 55.0* 57.0* 51.6 47.4 40.9  MCV 69.3*  --  66.7*  --  65.9* 65.7* 65.4*  PLT 598*  --  683*  --  545* 599* 440*   < > = values in this interval not displayed.    Basic Metabolic Panel: Recent Labs  Lab 07/10/20 0812 07/10/20 1213 07/10/20 1622 07/11/20 0445 07/11/20 1715 07/12/20 0300 07/23/2020 0433  NA 135 135 136 132*  --  137 138  K 4.4 4.5 4.6 4.5  --  4.4 4.1  CL 109 110 111 108  --  110 110  CO2 17* 17* 16* 17*  --  18* 23  GLUCOSE 191* 166* 130* 180*  --  190* 177*  BUN 26* 24* 24* 25*  --  22 21  CREATININE 1.35* 1.20 1.25* 1.29*  --  1.11 1.00  CALCIUM 8.2* 8.2* 8.4* 7.9*  --  8.2* 8.4*  MG 1.9  --   --  2.0 2.0 1.9 1.9  PHOS 4.3  --   --  4.1 3.0 2.3* 2.3*   GFR: Estimated Creatinine Clearance: 73 mL/min (by C-G formula based on SCr of 1 mg/dL). Recent Labs  Lab 07/08/20 1846 07/08/20 2159 07/09/20 0124 07/09/20 0414 07/10/20 0337 07/10/20 0338 07/10/20 0936 07/10/20 1139 07/11/20 0445 07/11/20 1652 07/12/20 0300 07/23/2020 0433   PROCALCITON 0.10  --  2.54  --   --   --   --   --   --   --   --   --   WBC  --   --  31.6*  --  16.7*  --   --   --  17.4*  --  16.7* 15.4*  LATICACIDVEN  --    < >  --    < >  --  2.2* 2.2* 2.0*  --  1.3  --   --    < > = values in this interval not displayed.    Liver Function Tests: Recent Labs  Lab 07/08/20 1806 07/10/20 0812 07/10/20 1622 07/25/2020 0433  AST 97* 79* 77* 32  ALT 79* 94* 100* 34  ALKPHOS 95 75 83 72  BILITOT 0.7 0.9 0.7 0.8  PROT 7.5 6.6 7.1 6.1*  ALBUMIN 3.7 3.1* 3.4* 2.6*   No results for input(s): LIPASE, AMYLASE in the last 168 hours. No results for input(s): AMMONIA in the last 168 hours.  ABG  Component Value Date/Time   PHART 7.29 (L) 07/11/2020 1202   PCO2ART 37 07/11/2020 1202   PO2ART 104 07/11/2020 1202   HCO3 17.8 (L) 07/11/2020 1202   TCO2 16 (L) 07/10/2020 0614   ACIDBASEDEF 8.0 (H) 07/11/2020 1202   O2SAT 97.3 07/11/2020 1202     Coagulation Profile: Recent Labs  Lab 07/08/20 1846 07/09/20 0122  INR 1.6* 1.7*    Cardiac Enzymes: No results for input(s): CKTOTAL, CKMB, CKMBINDEX, TROPONINI in the last 168 hours.  HbA1C: Hgb A1c MFr Bld  Date/Time Value Ref Range Status  07/08/2020 09:59 PM 6.2 (H) 4.8 - 5.6 % Final    Comment:    (NOTE) Pre diabetes:          5.7%-6.4%  Diabetes:              >6.4%  Glycemic control for   <7.0% adults with diabetes     CBG: Recent Labs  Lab 07/12/20 2010 07/30/2020 0040 07/12/2020 0405 07/11/2020 0742 07/06/2020 1145  GLUCAP 169* 148* 174* 155* 230*    Review of Systems:   Unable to obtain as patient is unresponsive  Past Medical History:  He,  has no past medical history on file.   Surgical History:  Reviewed  Social History:      Family History:  His family history is not on file.   Allergies Not on File   Home Medications  Prior to Admission medications   Medication Sig Start Date End Date Taking? Authorizing Provider  amiodarone (NEXTERONE PREMIX) 360-4.14  MG/200ML-% SOLN Inject 30 mg/hr into the vein continuous. 07/10/20   Awilda Bill, NP  artificial tears (LACRILUBE) OINT ophthalmic ointment Place 1 application into both eyes every 8 (eight) hours. 07/10/20   Awilda Bill, NP  Chlorhexidine Gluconate Cloth 2 % PADS Apply 6 each topically daily. 07/11/20   Awilda Bill, NP  chlorhexidine gluconate, MEDLINE KIT, (PERIDEX) 0.12 % solution 15 mLs by Mouth Rinse route 2 (two) times daily. 07/10/20   Awilda Bill, NP  dextrose 10 % infusion Inject 30 mL/hr into the vein continuous. 07/10/20   Awilda Bill, NP  fentaNYL (SUBLIMAZE) SOLN Inject 25 mcg into the vein every 30 (thirty) minutes as needed. 07/10/20   Awilda Bill, NP  fentaNYL 10 mcg/ml SOLN infusion Inject 100-300 mcg/hr into the vein continuous. 07/10/20   Awilda Bill, NP  heparin 100 units/mL SOLN Inject 2,000 Units into the vein once for 1 dose. 07/10/2020 07/06/2020  Flora Lipps, MD  heparin 25000 UT/250ML infusion Inject 750 Units/hr into the vein continuous. 07/10/20   Awilda Bill, NP  insulin aspart (NOVOLOG) 100 UNIT/ML injection Inject 0-20 Units into the skin every 4 (four) hours. 07/10/20   Awilda Bill, NP  levETIRAcetam (KEPPRA) 1000 MG/100ML SOLN Inject 100 mLs (1,000 mg total) into the vein every 12 (twelve) hours. 07/10/20   Awilda Bill, NP  Midazolam HCl (MIDAZOLAM 50MG/50ML, 1MG/ML, PREMIX INFUSION) Inject 0.5-10 mg/hr into the vein continuous. 07/10/20   Awilda Bill, NP  norepinephrine (LEVOPHED) 4-5 MG/250ML-% SOLN Inject 2-10 mcg/min into the vein continuous. 07/10/20   Awilda Bill, NP  Nutritional Supplements (FEEDING SUPPLEMENT, PROSOURCE TF,) liquid Place 45 mLs into feeding tube 2 (two) times daily. 07/10/20   Awilda Bill, NP  Nutritional Supplements (FEEDING SUPPLEMENT, VITAL AF 1.2 CAL,) LIQD Place 1,000 mLs into feeding tube continuous. 07/10/20   Awilda Bill, NP  pantoprazole (PROTONIX) 40 MG injection Inject  40 mg into the vein at  bedtime. 07/10/20   Awilda Bill, NP  PHENObarbital (LUMINAL) 65 MG/ML injection Inject 0.6 mLs (39 mg total) into the vein 2 (two) times daily. 07/23/2020   Flora Lipps, MD  propofol (DIPRIVAN) 1000 MG/100ML EMUL injection Inject 392-6,272 mcg/min into the vein continuous. 07/10/20   Awilda Bill, NP  sodium chloride 0.9 % infusion Inject 250 mLs into the vein continuous. 07/10/20   Awilda Bill, NP  sodium phosphate 15 mmol in dextrose 5 % 250 mL Inject 15 mmol into the vein once for 1 dose. 07/22/2020 07/14/2020  Flora Lipps, MD  Water For Irrigation, Sterile (FREE WATER) SOLN Place 30 mLs into feeding tube every 4 (four) hours. 07/10/20   Awilda Bill, NP     Critical care time:     Total critical care time: 49 minutes  Performed by: Page care time was exclusive of separately billable procedures and treating other patients.   Critical care was necessary to treat or prevent imminent or life-threatening deterioration.   Critical care was time spent personally by me on the following activities: development of treatment plan with patient and/or surrogate as well as nursing, discussions with consultants, evaluation of patient's response to treatment, examination of patient, obtaining history from patient or surrogate, ordering and performing treatments and interventions, ordering and review of laboratory studies, ordering and review of radiographic studies, pulse oximetry and re-evaluation of patient's condition.   Jacky Kindle MD Early Pulmonary Critical Care See Amion for pager If no response to pager, please call [phone] until 7pm After 7pm, Please call E-link [phone]

## 2020-07-13 NOTE — Progress Notes (Signed)
eeg done °

## 2020-07-13 NOTE — Progress Notes (Signed)
NAME:  Travis Maxwell, MRN:  [id number], DOB:  10-Jan-1951, LOS: 5 ADMISSION DATE:  07/08/2020  BRIEF SYNOPSIS  History of Present Illness:  70 year old male  arrived to the ED s/p out of hospital V-fib Cardiac arrest. Patient had complained of feeling hot with chest discomfort and found unresponsive by brother who started CPR. EMS arrived and patient reported in V.fib s/p shock x 2 and epi x 2, amiodarone and ROSC was achieved. In the ED patient had ventricular arrhythmia requiring shock x 2. Cardiology evaluated the patient, no emergent plans for cardiac cath at this time per report. PCCM admitted.   Of note, patient has prior hospitalization for cardiac arrest in 02/2020 for cardiogenic shock witth VF/VT. Patient with prolonged QTc in setting of olanzapine (unable to be discontinued).  Pertinent  Medical History  HTN, T2DM, HLD, GERD, CAD (s/p PC toRCAI), AVR with bioprosthetic valve, systolic CHF with EF of 20%, and paroxysmal A. Fib  Significant Hospital Events: Including procedures, antibiotic start and stop dates in addition to other pertinent events    5/4 Admit to ICU for cardiac arrest. Cooled and paralyzed  5/5 Paralytics discontinued. EEG suggestive of anoxic brain injury with myoclonic seizure and generalized epileptogenicity. Central line placed for IV access  5/6 rewarming started at midnight  5/7 he is in maintenance of normothermia phase of targeted temperature management.  Remains on mechanical ventilation with low requirements.  He does not have significant vasopressor needs.    Interim History / Subjective:  Severe cardiomyopathy Severe resp failure Remains critically ill Signs of severe brain damage     Antimicrobials:   Antibiotics Given (last 72 hours)    None          Objective   Blood pressure 107/66, pulse 73, temperature 99.32 F (37.4 C), resp. rate (!) 24, height 5\' 10"  (1.778 m), weight 80.5 kg, SpO2 99 %. CVP:  [11 mmHg-15 mmHg] 11  mmHg  Vent Mode: PRVC FiO2 (%):  [35 %] 35 % Set Rate:  [24 bmp] 24 bmp Vt Set:  [500 mL] 500 mL PEEP:  [5 cmH20] 5 cmH20 Plateau Pressure:  [17 cmH20] 17 cmH20   Intake/Output Summary (Last 24 hours) at 06-Aug-2020 0755 Last data filed at 06-Aug-2020 09/12/2020 Gross per 24 hour  Intake 2697.4 ml  Output 1225 ml  Net 1472.4 ml   Filed Weights   07/08/20 1833 07/12/20 0352 2020-08-06 0417  Weight: 78.4 kg 75.6 kg 80.5 kg      REVIEW OF SYSTEMS  PATIENT IS UNABLE TO PROVIDE COMPLETE REVIEW OF SYSTEMS DUE TO SEVERE CRITICAL ILLNESS AND TOXIC METABOLIC ENCEPHALOPATHY   PHYSICAL EXAMINATION:  GENERAL:critically ill appearing, +resp distress HEAD: Normocephalic, atraumatic.  EYES: Pupils equal, round, reactive to light.  No scleral icterus.  MOUTH: Moist mucosal membrane. NECK: Supple. PULMONARY: +rhonchi, +wheezing CARDIOVASCULAR: S1 and S2. Regular rate and rhythm. No murmurs, rubs, or gallops.  GASTROINTESTINAL: Soft, nontender, -distended. Positive bowel sounds.  MUSCULOSKELETAL: No swelling, clubbing, or edema.  NEUROLOGIC: obtunded SKIN:intact,warm,dry   Labs/imaging that I havepersonally reviewed  (right click and "Reselect all SmartList Selections" daily)       ASSESSMENT AND PLAN SYNOPSIS 70 year old male  arrived to the ED s/p out of hospital V-fib Cardiac arrest  With mulitoag failure and signs and symptoms of brain damage  Severe ACUTE Hypoxic and Hypercapnic Respiratory Failure -continue Mechanical Ventilator support -continue Bronchodilator Therapy -Wean Fio2 and PEEP as tolerated -VAP/VENT bundle implementation  CARDIAC FAILURE- acute Systolic -oxygen as needed -Lasix as tolerated -follow up cardiac enzymes as indicated   CARDIAC ICU monitoring   ACUTE KIDNEY INJURY/Renal Failure -continue Foley Catheter-assess need -Avoid nephrotoxic agents -Follow urine output, BMP -Ensure adequate renal perfusion, optimize oxygenation -Renal dose  medications   NEUROLOGY Acute toxic metabolic encephalopathy, need for sedation Goal RASS -2 to -3 Signs and symptoms of brain damage Await NEURO recs   CARDIOGENIC SHOCK -use vasopressors to keep MAP>65 as needed   ENDO - ICU hypoglycemic\Hyperglycemia protocol -check FSBS per protocol   GI GI PROPHYLAXIS as indicated  NUTRITIONAL STATUS DIET-->TF's as tolerated Constipation protocol as indicated   ELECTROLYTES -follow labs as needed -replace as needed -pharmacy consultation and following    Best practice (right click and "Reselect all SmartList Selections" daily)  Diet:  Start tube feeds Pain/Anxiety/Delirium protocol (if indicated): Yes (RASS goal -1) VAP protocol (if indicated): Yes DVT prophylaxis: Systemic AC GI prophylaxis: PPI Glucose control:  SSI Yes Central venous access:  N/A Arterial line:  N/A Foley:  Yes, and it is still needed Mobility:  bed rest  PT consulted: N/A Last date of multidisciplinary goals of care discussion [5/6] Confirmed full code status. Dr Mikki Harbor spoke with daughter [name] on 07/10/20 and she thinks patient would want to live at all costs. She thinks he would have been okay with trach and PEG if he should need it.  Code Status:  full code Disposition: ICU    Labs   CBC: Recent Labs  Lab 07/09/20 0124 07/10/20 0025 07/10/20 0337 07/10/20 0614 07/11/20 0445 07/12/20 0300 07-20-2020 0433  WBC 31.6*  --  16.7*  --  17.4* 16.7* 15.4*  NEUTROABS  --   --   --   --  13.0*  --  10.3*  HGB 16.3   < > 16.1 19.4* 15.7 14.1 12.3*  HCT 56.5*   < > 55.0* 57.0* 51.6 47.4 40.9  MCV 69.3*  --  66.7*  --  65.9* 65.7* 65.4*  PLT 598*  --  683*  --  545* 599* 440*   < > = values in this interval not displayed.    Basic Metabolic Panel: Recent Labs  Lab 07/10/20 0812 07/10/20 1213 07/10/20 1622 07/11/20 0445 07/11/20 1715 07/12/20 0300 07/20/20 0433  NA 135 135 136 132*  --  137 138  K 4.4 4.5 4.6 4.5  --  4.4 4.1  CL 109  110 111 108  --  110 110  CO2 17* 17* 16* 17*  --  18* 23  GLUCOSE 191* 166* 130* 180*  --  190* 177*  BUN 26* 24* 24* 25*  --  22 21  CREATININE 1.35* 1.20 1.25* 1.29*  --  1.11 1.00  CALCIUM 8.2* 8.2* 8.4* 7.9*  --  8.2* 8.4*  MG 1.9  --   --  2.0 2.0 1.9 1.9  PHOS 4.3  --   --  4.1 3.0 2.3* 2.3*   GFR: Estimated Creatinine Clearance: 73 mL/min (by C-G formula based on SCr of 1 mg/dL). Recent Labs  Lab 07/08/20 1846 07/08/20 2159 07/09/20 0124 07/09/20 0414 07/10/20 0337 07/10/20 0338 07/10/20 0936 07/10/20 1139 07/11/20 0445 07/11/20 1652 07/12/20 0300 07/20/2020 0433  PROCALCITON 0.10  --  2.54  --   --   --   --   --   --   --   --   --   WBC  --   --  31.6*  --  16.7*  --   --   --  17.4*  --  16.7* 15.4*  LATICACIDVEN  --    < >  --    < >  --  2.2* 2.2* 2.0*  --  1.3  --   --    < > = values in this interval not displayed.    Liver Function Tests: Recent Labs  Lab 07/08/20 1806 07/10/20 0812 07/10/20 1622 08/03/2020 0433  AST 97* 79* 77* 32  ALT 79* 94* 100* 34  ALKPHOS 95 75 83 72  BILITOT 0.7 0.9 0.7 0.8  PROT 7.5 6.6 7.1 6.1*  ALBUMIN 3.7 3.1* 3.4* 2.6*   No results for input(s): LIPASE, AMYLASE in the last 168 hours. No results for input(s): AMMONIA in the last 168 hours.  ABG    Component Value Date/Time   PHART 7.29 (L) 07/11/2020 1202   PCO2ART 37 07/11/2020 1202   PO2ART 104 07/11/2020 1202   HCO3 17.8 (L) 07/11/2020 1202   TCO2 16 (L) 07/10/2020 0614   ACIDBASEDEF 8.0 (H) 07/11/2020 1202   O2SAT 97.3 07/11/2020 1202     Coagulation Profile: Recent Labs  Lab 07/08/20 1846 07/09/20 0122  INR 1.6* 1.7*    Cardiac Enzymes: No results for input(s): CKTOTAL, CKMB, CKMBINDEX, TROPONINI in the last 168 hours.  HbA1C: Hgb A1c MFr Bld  Date/Time Value Ref Range Status  07/08/2020 09:59 PM 6.2 (H) 4.8 - 5.6 % Final    Comment:    (NOTE) Pre diabetes:          5.7%-6.4%  Diabetes:              >6.4%  Glycemic control for    <7.0% adults with diabetes     CBG: Recent Labs  Lab 07/12/20 1541 07/12/20 2010 07/18/2020 0040 08/04/2020 0405 07/23/2020 0742  GLUCAP 178* 169* 148* 174* 155*    Allergies Not on File     DVT/GI PRX  assessed I Assessed the need for Labs I Assessed the need for Foley I Assessed the need for Central Venous Line Family Discussion when available I Assessed the need for Mobilization I made an Assessment of medications to be adjusted accordingly Safety Risk assessment completed  CASE DISCUSSED IN MULTIDISCIPLINARY ROUNDS WITH ICU TEAM     Critical Care Time devoted to patient care services described in this note is 55 minutes.  Critical care was necessary to treat or prevent imminent or life-threatening deterioration. Overall, patient is critically ill, prognosis is guarded.  Patient with Multiorgan failure and at high risk for cardiac arrest and death.    Lucie Leather, M.D.  [facility]  Medical Director [facility] Medical Director [facility] Cardio-Pulmonary Department

## 2020-07-13 NOTE — Progress Notes (Addendum)
Neurology Progress Note  Patient ID: Travis Maxwell is a 70 y.o.  male with a past medical history significant for coronary artery disease (proximal RCA stent 2019), congestive heart failure (EF 20 to 25% 01/21/2020) hypertension, diabetes, aortic valve replacement, paroxysmal atrial fibrillation with RVR (on Eliquis), mood disorder (on olanzapine), ongoing tobacco abuse, malnutrition, presented after V. fib arrest, s/p TTM, rewarming completed 5/7 AM though remains on sedation secondary to seizure activity  Initially consulted for: S/p anoxic brain injury, concern for seizure activity  Major interval events:  -No acute events overnight  Patient is awaiting transfer to [facility] for cEEG. In the interim we performed a follow up spot EEG today (45 min recording after holding propofol and versed for >60 min prior). Patient did not show any clinical myoclonus or other abnormal movements for the 90 minutes we were at bedside. I reviewed the EEG live at bedside and saw moderate diffuse slowing with GPDs at approx 1 Hz. No definitive seizure activity.  PHB now therapeutic.   Subjective: -Unresponsive  Interval data:  MRI brain wo contrast  Low level restricted diffusion within the caudate nuclei consistent with hypoxic ischemic injury. Question lower level involvement of both thalami and both hippocampi. No gross brain swelling. No hemorrhage.  Old ischemic changes elsewhere throughout the brain as outlined Above.  CNS imaging personally reviewed; I agree with above interpretation EEG also personally reviewed by me   Exam: Vitals:   07/19/2020 1000 07/30/2020 1100  BP: 120/68 121/65  Pulse: 83 84  Resp:    Temp: 99.5 F (37.5 C) 99.68 F (37.6 C)  SpO2: 97% 97%   Constitutional: Chronically ill appearing Psych: Unresponsive Eyes: No scleral injection or edema, ointment on eyes appropriate for his intubation HENT: ETT in place  MSK: no joint deformities.  Cardiovascular: Normal  rate and regular rhythm.  Respiratory: Breathing over the vent when rate is turned down GI: Soft.  No distension.  Skin: Warm dry and intact visible skin  Neuro - with sedation held  for 60 minutes  Mental Status: Does not respond to noxious stimuli in any way, does not follow commands No intermittent blinking/jaw tremoring associated with prior seizure (last seen on Friday) Cranial Nerves: II: No clear blink to threat, does not have any blinking. Pupils are equal, mildly irregular, and reactive to light, sluggish, 3-> 2  mm.  Right pupil is slightly larger than the left III,IV, VI/VIII: VOR is incompletely present on the right eye only V/VII: No corneals today, this has been variable. Grimaced just before coughing is symmetric in the upper face, difficult to assess in the lower face given ET tube VIII: No response to voice X/XI: Cough is present and strong today  XII: Unable to assess tongue protrusion secondary to patient's mental status and intubation Motor/Sensory: Tone is low throughout. Bulk is normal.    No longer has myoclonic jerking of bilateral upper extremities with noxious stimulation.  No spontaneous movement of any extremity. No response to noxious stimuli in any extremity. Cerebellar: UTA  Results for Travis Maxwell, Travis Maxwell (MRN [id number]) as of 07/24/2020 12:41   Ref. Range 07/28/2020 04:33  Creatinine Latest Ref Range: 0.61 - 1.24 mg/dL 4.09     Ref. Range 07/12/2020 17:00  PHENOBARBITAL Latest Ref Range: 15.0 - 30.0 ug/mL 24.0      Basic Metabolic Panel: Recent Labs  Lab 07/10/20 0812 07/10/20 1213 07/10/20 1622 07/11/20 0445 07/11/20 1715 07/12/20 0300 07/09/2020 0433  NA 135 135 136 132*  --  137 138  K 4.4 4.5 4.6 4.5  --  4.4 4.1  CL 109 110 111 108  --  110 110  CO2 17* 17* 16* 17*  --  18* 23  GLUCOSE 191* 166* 130* 180*  --  190* 177*  BUN 26* 24* 24* 25*  --  22 21  CREATININE 1.35* 1.20 1.25* 1.29*  --  1.11 1.00  CALCIUM 8.2* 8.2* 8.4* 7.9*  --  8.2*  8.4*  MG 1.9  --   --  2.0 2.0 1.9 1.9  PHOS 4.3  --   --  4.1 3.0 2.3* 2.3*   Lab Results  Component Value Date   ALT 34 07/31/2020   AST 32 08/02/2020   ALKPHOS 72 07/23/2020   BILITOT 0.8 07/18/2020   CBC: Recent Labs  Lab 07/09/20 0124 07/10/20 0025 07/10/20 0337 07/10/20 0614 07/11/20 0445 07/12/20 0300 07/07/2020 0433  WBC 31.6*  --  16.7*  --  17.4* 16.7* 15.4*  NEUTROABS  --   --   --   --  13.0*  --  10.3*  HGB 16.3   < > 16.1 19.4* 15.7 14.1 12.3*  HCT 56.5*   < > 55.0* 57.0* 51.6 47.4 40.9  MCV 69.3*  --  66.7*  --  65.9* 65.7* 65.4*  PLT 598*  --  683*  --  545* 599* 440*   < > = values in this interval not displayed.    Coagulation Studies: No results for input(s): LABPROT, INR in the last 72 hours.     Prior spot EEG 07/10/20:  Description:EEG 5/6 (personally reviewed), agree with Dr. Melynda Ripple read: "Initially showed continuous generalized 3 to 5 Hz theta-delta slowing. After propofol and Versed was stopped, EEG showed generalized periodic epileptiform discharges with overriding fast activity at 0.75-1.5Hz . EEG gradually worsened and showed generalized bursts of highly epileptiform discharges lasting 0.5 to 3 seconds alternating with brief 1 to 2-second periods of generalized background attenuation. As propofol and Versed was restarted, EEG again showed continuous generalized sharply contoured 3 to 5 Hz theta - delta slowing.Hyperventilation and photic stimulation were not performed."  Impression: Travis Maxwell is a 70 y.o.  male with a past medical history significant for coronary artery disease (proximal RCA stent 2019), congestive heart failure (EF 20 to 25% 01/21/2020) hypertension, diabetes, aortic valve replacement, paroxysmal atrial fibrillation with RVR (on Eliquis), mood disorder (on olanzapine), ongoing tobacco abuse, malnutrition, presented after V. fib arrest, s/p TTM, rewarming completed 5/7 AM though remains on sedation secondary to seizure activity.  While patient did not have seizures captured during today's 40 min recording given his ongoing requirement for intubation and sedation 2/2 mental status he does not have a neurologic exam to follow and needs continuous EEG monitoring to r/o nonconvulsive seizures. His spot EEG today off sedation showed no discrete seizures but the continuous GPD pattern is indicative of increased potential for epileptogenicity. We cannot perform accurate prognostication assessment until we are able to make sure his seizures are controlled by cEEG.  Recommendations: -Continue sedation (propofol, versed) -Continue Keppra 1000 mg twice daily to be adjusted as needed for renal function. If creatinine remains stable with CrCl > 80, LEV may be increased to 1500mg  q 12 hrs if indicated             Estimated Creatinine Clearance: 65.8 mL/min (by C-G formula based on SCr of 1.11 mg/dL).              CrCl 80 to 130  mL/minute/1.73 m2: 500 mg to 1.5 g every 12 hours.             CrCl 50 to <80 mL/minute/1.73 m2: 500 mg to 1 g every 12 hours.             CrCl 30 to <50 mL/minute/1.73 m2: 250 to 750 mg every 12 hours.             CrCl 15 to <30 mL/minute/1.73 m2: 250 to 500 mg every 12 hours.             CrCl <15 mL/minute/1.73 m2: 250 to 500 mg every 24 hours (expert opinion). -s/p phenobarb 10 mg/kg loading dose, started on 1 mg/kg/day divided twice daily - trough level 5/8 PM therapeutic at 24.0 (target range 15-30) -Long term AC will need to be changed from apixaban to warfarin due to drug-drug interactions  - D/c olanzapine (highly sedating). It can be restarted if needed for behavioral issues but it is not needed now. Moreover, it prolongs QTc, which should be avoided post cardiac arrest. - Patient to be transferred to [facility] today for cEEG  Bing Neighbors, MD [facility] [phone]  If 7pm- 7am, please page neurology on call as listed in AMION.   CRITICAL CARE Performed by: Jefferson Fuel   Total critical care time: 90 min  Critical care time was exclusive of separately billable procedures and treating other patients.  Critical care was necessary to treat or prevent imminent or life-threatening deterioration.  Critical care was time spent personally by me on the following activities: development of treatment plan with patient and/or surrogate as well as nursing, discussions with consultants, evaluation of patient's response to treatment, examination of patient, obtaining history from patient or surrogate, ordering and performing treatments and interventions, ordering and review of laboratory studies, ordering and review of radiographic studies, pulse oximetry and re-evaluation of patient's condition.

## 2020-07-13 NOTE — Progress Notes (Signed)
0700: Bedside verbal shift report received from [facility]. Drips verified. Pt is on Amiodarone 30 mg/hr, Heparin 1100 units/hr, Propofol 15 mcg/kg/min and Versed 4mg /hr.   Tube feeds infusing. Patient is unresponsive.   1030: Propofol and Versed turned off for EEG.   1100: EEG done. Dr. at bedside. EEG showed generalized epileptogenicity with high potential for seizures; profound diffuse anoxic brain injury.   Off sedation pt still unresponsive but BP shoot up to 180's systolic and bucking the vent RR high 30's to 40's, tons of thick white frothy secretions. Dr. Selina Cooley said may start on Fentanyl low dose. Medication started.   Selina Cooley donor services called for updates.   1330: Care link called. Report given to shannon.   1415: Transfer out report given to TK RN from [facility]. Patient is going to room 17.   Grand daughter at bedside and called her mom [facility] and she was notified of the transfer.   [facility] link transport at bedside now to pick up the patient. Discharge paperwork given.

## 2020-07-14 DIAGNOSIS — G931 Anoxic brain damage, not elsewhere classified: Secondary | ICD-10-CM

## 2020-07-14 LAB — POCT I-STAT 7, (LYTES, BLD GAS, ICA,H+H)
Acid-Base Excess: 0 mmol/L (ref 0.0–2.0)
Bicarbonate: 21 mmol/L (ref 20.0–28.0)
Calcium, Ion: 1.22 mmol/L (ref 1.15–1.40)
HCT: 43 % (ref 39.0–52.0)
Hemoglobin: 14.6 g/dL (ref 13.0–17.0)
O2 Saturation: 98 %
Patient temperature: 98.7
Potassium: 4.2 mmol/L (ref 3.5–5.1)
Sodium: 139 mmol/L (ref 135–145)
TCO2: 22 mmol/L (ref 22–32)
pCO2 arterial: 26.3 mmHg — ABNORMAL LOW (ref 32.0–48.0)
pH, Arterial: 7.511 — ABNORMAL HIGH (ref 7.350–7.450)
pO2, Arterial: 85 mmHg (ref 83.0–108.0)

## 2020-07-14 LAB — BLOOD GAS, ARTERIAL
Acid-base deficit: 12 mmol/L — ABNORMAL HIGH (ref 0.0–2.0)
Acid-base deficit: 10.6 mmol/L — ABNORMAL HIGH (ref 0.0–2.0)
Bicarbonate: 17.4 mmol/L — ABNORMAL LOW (ref 20.0–28.0)
Bicarbonate: 15.3 mmol/L — ABNORMAL LOW (ref 20.0–28.0)
FIO2: 0.4
FIO2: 0.6
MECHVT: 500 mL
MECHVT: 500 mL
O2 Saturation: 89 %
O2 Saturation: 99.6 %
PEEP: 5 cmH2O
PEEP: 8 cmH2O
Patient temperature: 37
Patient temperature: 37
RATE: 16 resp/min
RATE: 22 resp/min
pCO2 arterial: 51 mmHg — ABNORMAL HIGH (ref 32.0–48.0)
pCO2 arterial: 34 mmHg (ref 32.0–48.0)
pH, Arterial: 7.14 — CL (ref 7.350–7.450)
pH, Arterial: 7.26 — ABNORMAL LOW (ref 7.350–7.450)
pO2, Arterial: 74 mmHg — ABNORMAL LOW (ref 83.0–108.0)
pO2, Arterial: 199 mmHg — ABNORMAL HIGH (ref 83.0–108.0)

## 2020-07-14 LAB — CBC
HCT: 43.1 % (ref 39.0–52.0)
Hemoglobin: 13.3 g/dL (ref 13.0–17.0)
MCH: 19.7 pg — ABNORMAL LOW (ref 26.0–34.0)
MCHC: 30.9 g/dL (ref 30.0–36.0)
MCV: 63.8 fL — ABNORMAL LOW (ref 80.0–100.0)
Platelets: 515 10*3/uL — ABNORMAL HIGH (ref 150–400)
RBC: 6.76 MIL/uL — ABNORMAL HIGH (ref 4.22–5.81)
RDW: 24.9 % — ABNORMAL HIGH (ref 11.5–15.5)
WBC: 16.9 10*3/uL — ABNORMAL HIGH (ref 4.0–10.5)
nRBC: 0 % (ref 0.0–0.2)

## 2020-07-14 LAB — HEPARIN LEVEL (UNFRACTIONATED)
Heparin Unfractionated: 0.21 IU/mL — ABNORMAL LOW (ref 0.30–0.70)
Heparin Unfractionated: 0.24 IU/mL — ABNORMAL LOW (ref 0.30–0.70)

## 2020-07-14 LAB — BASIC METABOLIC PANEL
Anion gap: 11 (ref 5–15)
BUN: 12 mg/dL (ref 8–23)
CO2: 21 mmol/L — ABNORMAL LOW (ref 22–32)
Calcium: 8.9 mg/dL (ref 8.9–10.3)
Chloride: 107 mmol/L (ref 98–111)
Creatinine, Ser: 0.92 mg/dL (ref 0.61–1.24)
GFR, Estimated: >60 mL/min (ref >60)
Glucose, Bld: 149 mg/dL — ABNORMAL HIGH (ref 70–99)
Potassium: 4.3 mmol/L (ref 3.5–5.1)
Sodium: 139 mmol/L (ref 135–145)

## 2020-07-14 LAB — GLUCOSE, CAPILLARY
Glucose-Capillary: 159 mg/dL — ABNORMAL HIGH (ref 70–99)
Glucose-Capillary: 155 mg/dL — ABNORMAL HIGH (ref 70–99)
Glucose-Capillary: 148 mg/dL — ABNORMAL HIGH (ref 70–99)
Glucose-Capillary: 181 mg/dL — ABNORMAL HIGH (ref 70–99)
Glucose-Capillary: 160 mg/dL — ABNORMAL HIGH (ref 70–99)
Glucose-Capillary: 198 mg/dL — ABNORMAL HIGH (ref 70–99)

## 2020-07-14 LAB — MAGNESIUM: Magnesium: 1.9 mg/dL (ref 1.7–2.4)

## 2020-07-14 LAB — PHOSPHORUS: Phosphorus: 3.5 mg/dL (ref 2.5–4.6)

## 2020-07-14 MED ORDER — VITAL AF 1.2 CAL PO LIQD
1000.0000 mL | ORAL | Status: DC
  Administered 2020-07-14 – 2020-07-15 (×2): 1000 mL

## 2020-07-14 MED ORDER — PANTOPRAZOLE SODIUM 40 MG PO PACK
40.0000 mg | PACK | Freq: Every day | ORAL | Status: DC
  Administered 2020-07-14 – 2020-07-15 (×2): 40 mg
  Filled 2020-07-14 (×2): qty 20

## 2020-07-14 MED ORDER — VITAL HIGH PROTEIN PO LIQD: 1000.0000 mL | ORAL | Status: DC

## 2020-07-14 MED ORDER — LORAZEPAM 2 MG/ML IJ SOLN
2.0000 mg | INTRAMUSCULAR | Status: DC | PRN
  Administered 2020-07-15: 2 mg via INTRAVENOUS
  Filled 2020-07-14: qty 1

## 2020-07-14 MED ORDER — PROSOURCE TF PO LIQD
45.0000 mL | Freq: Two times a day (BID) | ORAL | Status: DC
  Administered 2020-07-14: 45 mL

## 2020-07-14 MED ORDER — GLYCOPYRROLATE 0.2 MG/ML IJ SOLN
0.1000 mg | Freq: Once | INTRAMUSCULAR | Status: AC
  Administered 2020-07-14: 0.1 mg via INTRAVENOUS
  Filled 2020-07-14: qty 1

## 2020-07-14 MED ORDER — MAGNESIUM SULFATE 2 GM/50ML IV SOLN
2.0000 g | Freq: Once | INTRAVENOUS | Status: AC
  Administered 2020-07-14: 2 g via INTRAVENOUS
  Filled 2020-07-14: qty 50

## 2020-07-14 MED ORDER — CHLORHEXIDINE GLUCONATE CLOTH 2 % EX PADS
6.0000 | MEDICATED_PAD | Freq: Every day | CUTANEOUS | Status: DC
  Administered 2020-07-15 – 2020-07-16 (×3): 6 via TOPICAL

## 2020-07-14 NOTE — Progress Notes (Signed)
ANTICOAGULATION CONSULT NOTE - Follow Up Consult  Pharmacy Consult for IV Heparin Indication: atrial fibrillation  Patient Measurements: Height: 5\' 10"  (177.8 cm) Weight: 83.2 kg (183 lb 6.8 oz) IBW/kg (Calculated) : 73 Heparin Dosing Weight: 78 kg  Vital Signs: Temp: 99.8 F (37.7 C) (05/10 2000) Temp Source: Axillary (05/10 2000) BP: 152/82 (05/10 2000) Pulse Rate: 111 (05/10 2000)  Labs: Recent Labs    07/12/20 0300 07/12/20 0940 07/12/20 1700 2020-07-15 0000 15-Jul-2020 0433 07/15/2020 1125 Jul 15, 2020 2241 07/14/20 0349 07/14/20 0352 07/14/20 0935 07/14/20 1907  HGB 14.1  --   --   --  12.3*  --   --  13.3 14.6  --   --   HCT 47.4  --   --   --  40.9  --   --  43.1 43.0  --   --   PLT 599*  --   --   --  440*  --   --  515*  --   --   --   APTT 59*   < > 55* 60*  --  57*  --   --   --   --   --   HEPARINUNFRC 0.52  --   --   --  0.26*  --  0.21*  --   --  0.21* 0.24*  CREATININE 1.11  --   --   --  1.00  --   --  0.92  --   --   --    < > = values in this interval not displayed.    Estimated Creatinine Clearance: 79.3 mL/min (by C-G formula based on SCr of 0.92 mg/dL).   Medications:  Infusions:  . sodium chloride 10 mL/hr at 07/14/20 1900  . sodium chloride 10 mL/hr at 07/14/20 1900  . amiodarone 30 mg/hr (07/14/20 1900)  . feeding supplement (VITAL AF 1.2 CAL) 1,000 mL (07/14/20 1339)  . heparin 1,500 Units/hr (07/14/20 1900)  . levETIRAcetam Stopped (07/14/20 0930)    Assessment: 70 yr old man transferred from [facility] for higher level of care. Pt is s/p cardiac arrest on 5/4. Apparently, he was on a DOAC for afib PTA which baseline labs (heparin level >1.1) on admission would support. Currently using heparin levels to monitor anticoagulation.  Heparin level ~7.5 hrs after heparin infusion was increased to 1500 units/hr was 0.24 units/ml, which remains below the goal range for this pt, despite rate increase. H/H 14.6/43.0, plt 515 (CBC stable). Per RN, no issues  with IV or bleeding observed.  Goal of Therapy:  Heparin level 0.3-0.7 units/ml Monitor platelets by anticoagulation protocol: Yes   Plan:  Increase heparin infusion to 1650 units/hr Check heparin level in 6 hrs Monitor daily heparin level, CBC Monitor for bleeding  Thank you for allowing pharmacy to be a part of this patient's care.  06-10-1969, PharmD, BCPS, [facility] Clinical Pharmacist 07/14/2020 8:46 PM  .

## 2020-07-14 NOTE — Progress Notes (Signed)
Pharmacy Electrolyte Replacement  Recent Labs:  Recent Labs    07/14/20 0349 07/14/20 0352  K 4.3 4.2  MG 1.9  --   PHOS 3.5  --   CREATININE 0.92  --     Low Critical Values (K </= 2.5, Phos </= 1, Mg </= 1) Present: None  Plan:  - Mg 1.9 - give Mg 2g IV x 1 - F/u Mg with AM labs  Georgina Pillion, PharmD, BCPS  7:41 AM

## 2020-07-14 NOTE — Progress Notes (Addendum)
Subjective: No acute events overnight.  No clinical seizures.  ROS: Unable to obtain due to poor mental status  Examination  Vital signs in last 24 hours: Temp:  [98.4 F (36.9 C)-100.58 F (38.1 C)] 98.7 F (37.1 C) (05/10 0800) Pulse Rate:  [73-101] 80 (05/10 0900) Resp:  [22-25] 24 (05/10 0900) BP: (107-136)/(63-80) 118/69 (05/10 0900) SpO2:  [95 %-100 %] 95 % (05/10 0900) Arterial Line BP: (113-139)/(55-66) 113/55 (05/09 1400) FiO2 (%):  [28 %-50 %] 30 % (05/10 0843) Weight:  [83.2 kg] 83.2 kg (05/10 0627)  General: lying in bed, not in apparent distress CVS: pulse-normal rate and rhythm RS: Intubated, coarse breath sounds bilaterally Extremities: normal, warm Neuro: Comatose, does not open eyes to noxious stimuli, corneal reflex intact bilaterally, difficult to evaluate pupillary reactivity due to downward gaze, gag reflex intact, does not withdraw to noxious stimuli in all 4 extremities   Basic Metabolic Panel: Recent Labs  Lab 07/10/20 1622 07/11/20 0445 07/11/20 1715 07/12/20 0300 Jul 18, 2020 0433 07/14/20 0349 07/14/20 0352  NA 136 132*  --  137 138 139 139  K 4.6 4.5  --  4.4 4.1 4.3 4.2  CL 111 108  --  110 110 107  --   CO2 16* 17*  --  18* 23 21*  --   GLUCOSE 130* 180*  --  190* 177* 149*  --   BUN 24* 25*  --  22 21 12   --   CREATININE 1.25* 1.29*  --  1.11 1.00 0.92  --   CALCIUM 8.4* 7.9*  --  8.2* 8.4* 8.9  --   MG  --  2.0 2.0 1.9 1.9 1.9  --   PHOS  --  4.1 3.0 2.3* 2.3* 3.5  --     CBC: Recent Labs  Lab 07/10/20 0337 07/10/20 0614 07/11/20 0445 07/12/20 0300 07-18-20 0433 07/14/20 0349 07/14/20 0352  WBC 16.7*  --  17.4* 16.7* 15.4* 16.9*  --   NEUTROABS  --   --  13.0*  --  10.3*  --   --   HGB 16.1   < > 15.7 14.1 12.3* 13.3 14.6  HCT 55.0*   < > 51.6 47.4 40.9 43.1 43.0  MCV 66.7*  --  65.9* 65.7* 65.4* 63.8*  --   PLT 683*  --  545* 599* 440* 515*  --    < > = values in this interval not displayed.     Coagulation Studies: No  results for input(s): LABPROT, INR in the last 72 hours.  Imaging   MRI brain without contrast 07/12/2020: Low level restricted diffusion within the caudate nuclei consistent with hypoxic ischemic injury. Question lower level involvement of both thalami and both hippocampi. No gross brain swelling. No hemorrhage. There are old small vessel infarctions affecting the cerebellum. Old infarction in the medial right thalamus. Chronic small-vessel ischemic changes of the cerebral hemispheric white matter. Old right parietal cortical and subcortical infarction.      ASSESSMENT AND PLAN: 70 year old male status post out-of-hospital cardiac arrest.  TTM was started.  He was noted to have sporadic twitching consistent with myoclonic seizures for which Keppra and eventually phenobarbital was added.  EEG continued to show generalized periodic epileptiform discharges at 1.25 to 1.5 minutes.  Cardiac arrest Anoxic/hypoxic brain injury -LTM EEG continues to show generalized periodic epileptiform discharges at1.25-1.5Hz  suggestive of generalized epileptogenicity with high potential for seizures as well as profound diffuse encephalopathy. In setting of cardiac arrest, this EEG suggestive of anoxic/hypoxic  brain injury.  Recommendations -Continue Keppra and phenobarbital at current dose -We will DC LTM EEG if no seizures by tomorrow -Even though, MRI does not show the extent of anoxic/hypoxic brain injury, patient's history, exam and EEG findings are concerning for more extensive anoxic/hypoxic injury with minimal to no chances of meaningful neurologic recovery -continue seizure precautions -as needed IV Ativan 2 mg for clinical seizure-like activity -Management of rest of comorbidities per primary team  CRITICAL CARE Performed by: Charlsie Quest   Total critical care time: 35 minutes  Critical care time was exclusive of separately billable procedures and treating other patients.  Critical care was  necessary to treat or prevent imminent or life-threatening deterioration.  Critical care was time spent personally by me on the following activities: development of treatment plan with patient and/or surrogate as well as nursing, discussions with consultants, evaluation of patient's response to treatment, examination of patient, obtaining history from patient or surrogate, ordering and performing treatments and interventions, ordering and review of laboratory studies, ordering and review of radiographic studies, pulse oximetry and re-evaluation of patient's condition.    Lindie Spruce Epilepsy [facility] For questions after 5pm please refer to AMION to reach the Neurologist on call

## 2020-07-14 NOTE — Progress Notes (Signed)
NAME:  Travis Maxwell, MRN:  [id number], DOB:  Sep 08, 1950, LOS: 1 ADMISSION DATE:  07/23/2020, CONSULTATION DATE: 07/21/2020 REFERRING MD: Dr. Mortimer Fries from [facility]: Status post cardiac arrest, unresponsiveness  History of Present Illness:  Patient is unresponsive so most of the history is taken from chart review 70 year old male with prior history of cardiac arrest in December 2022 with VF/VT caused by prolonged QT in the setting of olanzapine, he presented to [facility] emergency department on 5/4 after he had V. fib cardiac arrest status post defibrillation x2 and epi x2. During his stay at [facility], patient had targeted therapeutic hypothermia, he was rewarmed on 5/6.  MRI brain was done which showed bilateral caudate nuclei involvement could be due to hypoxic/anoxic brain injury, EEG was done which was consistent with moderate to severe encephalopathy.  Patient was transferred to [facility] for further evaluation and continuous EEG monitoring as patient remained unresponsive.  Pertinent  Medical History  Hypertension, diabetes type 2 Hyperlipidemia Coronary artery disease status post PCI to RCA Status post bioprosthetic aortic valve replacement Systolic congestive heart failure, EF 20% Proximal A. fib  Significant Hospital Events: Including procedures, antibiotic start and stop dates in addition to other pertinent events    5/4 Admit to [facility] ICU for cardiac arrest. Cooled and paralyzed  5/5 Paralytics discontinued. EEG suggestive of anoxic brain injury with myoclonic seizure and generalized epileptogenicity. Central line placed for IV access  5/6 rewarming started at midnight  5/7 he is in maintenance of normothermia phase of targeted temperature management. Remains on mechanical ventilation with low requirements. He does not have significant vasopressor needs.   . 5/9, transferred from [name], cEEG initiated, CVC, femoral aline, and foley  d/c'd, sedation off  Interim History / Subjective:   No events overnight Remains on cEEG Copious oral secretions Off sedation since arrival to [facility] 5/9 UOP 300 ml/hr ~0.3 ml/kg/hr  Objective   Blood pressure 136/75, pulse 77, temperature 98.7 F (37.1 C), temperature source Axillary, resp. rate (!) 24, height _0  (1.778 m), weight 83.2 kg, SpO2 97 %.    Vent Mode: PRVC FiO2 (%):  [28 %-50 %] 30 % Set Rate:  [18 bmp-24 bmp] 18 bmp Vt Set:  [500 mL-580 mL] 580 mL PEEP:  [5 cmH20] 5 cmH20 Plateau Pressure:  [15 cmH20-19 cmH20] 19 cmH20   Intake/Output Summary (Last 24 hours) at 07/14/2020 0941 Last data filed at 07/14/2020 0800 Gross per 24 hour  Intake 1219.47 ml  Output 300 ml  Net 919.47 ml   Filed Weights   07/14/20 8101  Weight: 83.2 kg    Examination: General:  Acute on chronically ill appearing male on MV in NAD on cEEG HEENT: MM pink/moist, copious oral secretions, slight protrusion of tongue with tip ulcer, ETT/ OGT, pupils 4/reactive, anicertic +corneals Neuro: unresponsive to noxious stimuli  CV: NSR, +distal pulses PULM:  Non labored, breathing w/ MV and at time over set rate, coarse throughout, some thick/ tan secretions, strong cough GI: soft, bs active, condom cath Extremities: warm/dry Skin: no rashes   Labs/imaging that I havepersonally reviewed  (right click and "Reselect all SmartList Selections" daily)  MRI brain 5/8  5/10:  ABG 7.511/ 26.3/ 85/ 21 CBC- WBC 15.4-> 16.9, plts 440-> 515 BMET/ Mag, Mag 1.9 Heparin level 0.26-> 0.21  ABGs: 7.2 9/37/104/97% Chemistry: Sodium 138, creatinine 1.0, phosphorus 2.3, magnesium 1.9 CBC: WBC 15.4 Hb 12.3, platelet 440  5/10 cEEG thus far showing GPDs, profound diffuse encephalopathy,  suggestive of anoxic/ hypoxic injury   Resolved Hospital Problem list     Assessment & Plan:  Status post V. fib/V. tach cardiac arrest in the setting of prolonged QT Possible hypoxic/anoxic brain  injury Seizures Acute hypoxic/hypercapnic respiratory failure Acute encephalopathy metabolic/anoxic Chronic systolic congestive heart failure Diabetes type 2 Acute kidney injury, improving Hypophosphatemia Paroxysmal A. Fib Prolonged QTc  Patient is status post TTM Echocardiogram showed EF 20% with global hypokinesis MRI brain showed bilateral caudate nuclei signals could be related to hypoxic/anoxic brain injury   Plan:  - Continue AEDs: phenobarbital and keppra per Neuro recs - Appreciate Neuro input and assistance with neuro prognostication  - Ongoing cEEG per Neuro, evidence of anoxic injury based on MRI and now EEG - sedation off 5/9 1500 - Maintain neuro protective measures; goal for eurothermia, euglycemia, eunatermia, - normoxia, and PCO2 goal of 35-40; Nutrition and bowel regiment  - Seizure precautions  - ABG noted for resp alk, will reduce rate from 24-> 22 - Continue MV support, 4-8cc/kg IBW with goal Pplat <30 and DP<15  - VAP prevention protocol/ PPI - PAD protocol for sedation> holding sedation for neuro prognostication  - wean FiO2 as able for SpO2 >92%  - daily SAT - will consult cardiology to follow here at [facility] - heparin per pharmacy given high CHA2DS2-VASc 5  - continue amio, remains in NSR - monitor QTc on tele, currently 0.590 per RN  - avoid QTc prolonging meds  - s/p Mag 2gm  - goal Mag > 2, K > 4 - monitor UOP and for urinary retention, prn bladder scan - trend CBC/ BMET/ Mag - previously not an ICD candidate/ lifevest due to mental status/ psych issues in 02/2020 with prior cardiac arrest, will need to determine neuro prognostication prior to asking cardiology for longterm recs - ongoing Vadito discussions with patient's daughter, consider PMT consult  Best practice (right click and "Reselect all SmartList Selections" daily)  Diet:  NPO, start TF Pain/Anxiety/Delirium protocol (if indicated): No, not needed VAP protocol (if indicated): Yes DVT  prophylaxis: Systemic AC GI prophylaxis: H2B Glucose control:  SSI Yes Central venous access:  N/A Arterial line:  N/A Foley:  N/A Mobility:  bed rest  PT consulted: N/A Last date of multidisciplinary goals of care discussion: Daughter updated by Dr. Tacy Learn 5/10, plans to continue same/ ongoing aggressive care for 48 hr then reassess, likely transition to comfort care if no neurological improvements Code Status:  full code Disposition: ICU  Labs   CBC: Recent Labs  Lab 07/10/20 0337 07/10/20 0614 07/11/20 0445 07/12/20 0300 07/07/2020 0433 07/14/20 0349 07/14/20 0352  WBC 16.7*  --  17.4* 16.7* 15.4* 16.9*  --   NEUTROABS  --   --  13.0*  --  10.3*  --   --   HGB 16.1   < > 15.7 14.1 12.3* 13.3 14.6  HCT 55.0*   < > 51.6 47.4 40.9 43.1 43.0  MCV 66.7*  --  65.9* 65.7* 65.4* 63.8*  --   PLT 683*  --  545* 599* 440* 515*  --    < > = values in this interval not displayed.    Basic Metabolic Panel: Recent Labs  Lab 07/10/20 1622 07/11/20 0445 07/11/20 1715 07/12/20 0300 08/04/2020 0433 07/14/20 0349 07/14/20 0352  NA 136 132*  --  137 138 139 139  K 4.6 4.5  --  4.4 4.1 4.3 4.2  CL 111 108  --  110 110 107  --  CO2 16* 17*  --  18* 23 21*  --   GLUCOSE 130* 180*  --  190* 177* 149*  --   BUN 24* 25*  --  _0 --   CREATININE 1.25* 1.29*  --  1.11 1.00 0.92  --   CALCIUM 8.4* 7.9*  --  8.2* 8.4* 8.9  --   MG  --  2.0 2.0 1.9 1.9 1.9  --   PHOS  --  4.1 3.0 2.3* 2.3* 3.5  --    GFR: Estimated Creatinine Clearance: 79.3 mL/min (by C-G formula based on SCr of 0.92 mg/dL). Recent Labs  Lab 07/08/20 1846 07/08/20 2159 07/09/20 0124 07/09/20 0414 07/10/20 0338 07/10/20 0936 07/10/20 1139 07/11/20 0445 07/11/20 1652 07/12/20 0300 07/12/2020 0433 07/14/20 0349  PROCALCITON 0.10  --  2.54  --   --   --   --   --   --   --   --   --   WBC  --   --  31.6*   < >  --   --   --  17.4*  --  16.7* 15.4* 16.9*  LATICACIDVEN  --    < >  --    < > 2.2* 2.2* 2.0*  --   1.3  --   --   --    < > = values in this interval not displayed.    Liver Function Tests: Recent Labs  Lab 07/08/20 1806 07/10/20 0812 07/10/20 1622 07/22/2020 0433  AST 97* 79* 77* 32  ALT 79* 94* 100* 34  ALKPHOS 95 75 83 72  BILITOT 0.7 0.9 0.7 0.8  PROT 7.5 6.6 7.1 6.1*  ALBUMIN 3.7 3.1* 3.4* 2.6*   No results for input(s): LIPASE, AMYLASE in the last 168 hours. No results for input(s): AMMONIA in the last 168 hours.  ABG    Component Value Date/Time   PHART 7.511 (H) 07/14/2020 0352   PCO2ART 26.3 (L) 07/14/2020 0352   PO2ART 85 07/14/2020 0352   HCO3 21.0 07/14/2020 0352   TCO2 22 07/14/2020 0352   ACIDBASEDEF 8.0 (H) 07/11/2020 1202   O2SAT 98.0 07/14/2020 0352     Coagulation Profile: Recent Labs  Lab 07/08/20 1846 07/09/20 0122  INR 1.6* 1.7*    Cardiac Enzymes: No results for input(s): CKTOTAL, CKMB, CKMBINDEX, TROPONINI in the last 168 hours.  HbA1C: Hgb A1c MFr Bld  Date/Time Value Ref Range Status  07/08/2020 09:59 PM 6.2 (H) 4.8 - 5.6 % Final    Comment:    (NOTE) Pre diabetes:          5.7%-6.4%  Diabetes:              >6.4%  Glycemic control for   <7.0% adults with diabetes     CBG: Recent Labs  Lab 07/16/2020 1801 07/16/2020 1916 07/21/2020 2326 07/14/20 0320 07/14/20 0725  GLUCAP 145* 149* 187* 159* 155*     Critical care time: 24 mins       Kennieth Rad, ACNP [facility] 07/14/2020, 9:41 AM

## 2020-07-14 NOTE — Progress Notes (Signed)
Morning abg: 7.51, 26.3, 85, 21, 98%

## 2020-07-14 NOTE — Progress Notes (Signed)
ANTICOAGULATION CONSULT NOTE - Follow Up Consult  Pharmacy Consult for Heparin Indication: atrial fibrillation  Not on File  Patient Measurements: Height: 5\' 10"  (177.8 cm) Weight: 83.2 kg (183 lb 6.8 oz) IBW/kg (Calculated) : 73 Heparin Dosing Weight: 78 kg  Vital Signs: Temp: 98.7 F (37.1 C) (05/10 0800) Temp Source: Axillary (05/10 0800) BP: 136/78 (05/10 1126) Pulse Rate: 80 (05/10 0900)  Labs: Recent Labs    07/12/20 0300 07/12/20 0940 07/12/20 1700 08-10-2020 0000 2020-08-10 0433 Aug 10, 2020 1125 08-10-2020 2241 07/14/20 0349 07/14/20 0352 07/14/20 0935  HGB 14.1  --   --   --  12.3*  --   --  13.3 14.6  --   HCT 47.4  --   --   --  40.9  --   --  43.1 43.0  --   PLT 599*  --   --   --  440*  --   --  515*  --   --   APTT 59*   < > 55* 60*  --  57*  --   --   --   --   HEPARINUNFRC 0.52  --   --   --  0.26*  --  0.21*  --   --  0.21*  CREATININE 1.11  --   --   --  1.00  --   --  0.92  --   --    < > = values in this interval not displayed.    Estimated Creatinine Clearance: 79.3 mL/min (by C-G formula based on SCr of 0.92 mg/dL).   Medications:  Infusions:  . sodium chloride Stopped (07/14/20 0236)  . sodium chloride Stopped (07/14/20 0824)  . amiodarone 30 mg/hr (07/14/20 1102)  . heparin 1,350 Units/hr (07/14/20 0900)  . levETIRAcetam 1,000 mg (07/14/20 0914)    Assessment: 70 y/o M transfer from [facility] for higher level of care. Pt is s/p cardiac arrest on 5/4. Apparently on a DOAC for afib PTA which baseline labs (heparin level >1.1) on admission would support. They were using aPTT to dose for a few days but not heparin level and aPTT correlate, so will use heparin level only.    Heparin level this AM remains SUBtherapeutic despite a rate increase earlier today (HL 0.21, goal of 0.3-0.7). No bleeding or issues noted per RN.  Goal of Therapy:  Heparin level 0.3-0.7 units/ml Monitor platelets by anticoagulation protocol: Yes   Plan:  - Increase Heparin to  1500 units/hr (15 ml/hr) - Will continue to monitor for any signs/symptoms of bleeding and will follow up with heparin level in 6 hours   Thank you for allowing pharmacy to be a part of this patient's care.  7/4, PharmD, BCPS Clinical Pharmacist Clinical phone for 07/14/2020: 09/13/2020 07/14/2020 11:35 AM   **Pharmacist phone directory can now be found on [email or web address] ([email or web address]).  Listed under [facility].

## 2020-07-14 NOTE — Progress Notes (Signed)
ANTICOAGULATION CONSULT NOTE - Initial Consult  Pharmacy Consult for Heparin  Indication: chest pain/ACS and atrial fibrillation  Not on File   Vital Signs: Temp: 98.7 F (37.1 C) (05/10 0000) Temp Source: Axillary (05/10 0000) BP: 109/66 (05/09 2300) Pulse Rate: 78 (05/09 2303)  Labs: Recent Labs    07/11/20 0445 07/11/20 1127 07/12/20 0300 07/12/20 0940 07/12/20 1700 07/11/2020 0000 08/03/2020 0433 07/12/2020 1125 07/12/2020 2241  HGB 15.7  --  14.1  --   --   --  12.3*  --   --   HCT 51.6  --  47.4  --   --   --  40.9  --   --   PLT 545*  --  599*  --   --   --  440*  --   --   APTT 100*   < > 59*   < > 55* 60*  --  57*  --   HEPARINUNFRC 0.96*   < > 0.52  --   --   --  0.26*  --  0.21*  CREATININE 1.29*  --  1.11  --   --   --  1.00  --   --    < > = values in this interval not displayed.    Estimated Creatinine Clearance: 73 mL/min (by C-G formula based on SCr of 1 mg/dL).    Assessment: 70 y/o M transfer from [facility] for higher level of care. Pt is s/p cardiac arrest on 5/4. Apparently on a DOAC for afib PTA which baseline labs (heparin level >1.1) on admission would support. They were using aPTT to dose for a few days but not heparin level and aPTT correlate, so will use heparin level only.    Heparin level this AM is low  Goal of Therapy:  Heparin level 0.3-0.7 units/ml Monitor platelets by anticoagulation protocol: Yes   Plan:  Inc heparin to 1350 units/hr 1000 heparin level  Abran Duke, PharmD, BCPS Clinical Pharmacist Phone: [phone]

## 2020-07-14 NOTE — Progress Notes (Signed)
vLTM EEG maintenance complete. No skin breakdown at FP1 P4 A1. Continue to monitor

## 2020-07-14 NOTE — Consult Note (Signed)
Neurology Consultation Reason for Consult: Anoxic brain injury Referring Physician: Diannia Ruder  CC: Unresponsiveness  History is obtained from: Chart review  HPI: Travis Maxwell is a 70 y.o. male who suffered a V. fib cardiac arrest on 5/4.  He had multiple shocks and rounds of epi.  He was started on hypothermia.  He had an EEG on 5/5 which demonstrated myoclonic seizure as well as generalized periodic discharges.  Neurology was consulted and he was started on Keppra on 5/5.  When propofol was weaned, he had return of his movements.  He was loaded with phenobarbital on 5/8.  Due to concern for possible intermittent subclinical seizures, he has been transferred to [facility] for further evaluation.    ROS:  Unable to obtain due to altered mental status.   Past medical history: Unable to obtain due to altered mental status.  Family history: Unable to obtain due to altered mental status.  Social History: Unable to obtain due to altered mental status.  Exam: Current vital signs: BP 133/80 (BP Location: Left Arm)   Pulse 93   Temp 98.7 F (37.1 C) (Axillary)   Resp (!) 22   SpO2 97%  Vital signs in last 24 hours: Temp:  [98.5 F (36.9 C)-100.58 F (38.1 C)] 98.7 F (37.1 C) (05/10 0000) Pulse Rate:  [73-101] 93 (05/10 0305) Resp:  [22-25] 22 (05/10 0305) BP: (101-136)/(60-80) 133/80 (05/10 0300) SpO2:  [95 %-100 %] 97 % (05/10 0305) Arterial Line BP: (107-140)/(53-67) 113/55 (05/09 1400) FiO2 (%):  [28 %-50 %] 40 % (05/10 0305) Weight:  [80.5 kg] 80.5 kg (05/09 0417)   Physical Exam  Constitutional: Appears stated age Psych: Unresponsive Eyes: No scleral injection HENT: ET tube in place MSK: no joint deformities.  Cardiovascular: Normal rate and regular rhythm.  Respiratory: Effort normal, non-labored breathing GI: Soft.  No distension. There is no tenderness.  Skin: WDI  Neuro: Mental Status: Patient is obtunded, he does not open eyes or follow commands.  Cranial  Nerves: II: He does not blink to threat pupils are equal, round, and reactive to light.   III,IV, VI: Doll's eyes intact V:VII: Corneals are intact X: Cough is intact Motor: He has no movement to noxious stimulation in any extremity  sensory: As above  Deep Tendon Reflexes: 2+ and symmetric in the biceps and patellae.  Cerebellar: Does not perform   I have reviewed labs in epic and the results pertinent to this consultation are: Sodium 138 Creatinine 1.0  I have reviewed the images obtained: MRI brain-bilateral deep gray change concerning for anoxic brain injury.  Impression: 70 year old male with persistent, status post cardiac arrest.  I did not see the myoclonic seizures that were described earlier in his hospitalization, but his EEG of relatively monomorphic generalized periodic discharges on an attenuated background is highly concerning for a severe anoxic brain injury.  At this time, his EEG does not clearly demonstrate any ictal evolution and I would not aggressively pursue suppression of these discharges alone.  If he does develop seizures, then could be more aggressive with antiepileptic therapy at that time.   Recommendations: 1) continue phenobarbital 1mg /kg twice daily 2) continue Keppra 1 g twice daily 3) continue EEG 4) neurology will continue to follow.  This patient is critically ill and at significant risk of neurological worsening, death and care requires constant monitoring of vital signs, hemodynamics,respiratory and cardiac monitoring, neurological assessment, discussion with family, other specialists and medical decision making of high complexity. I spent 35 minutes  of neurocritical care time  in the care of  this patient. This was time spent independent of any time provided by nurse practitioner or PA.  Ritta Slot, MD [facility] [phone]  If 7pm- 7am, please page neurology on call as listed in AMION. 07/14/2020  4:17 AM

## 2020-07-14 NOTE — Procedures (Addendum)
Patient Name:Travis Maxwell CHE:[id number] Epilepsy Attending:Nazaria Riesen Annabelle Harman Referring Physician/Provider:Dr.Salman Derry Lory Duration:Jul 29, 2020 1631 to 07/14/2020 1631  Patient history:70 year old male status post V. fib arrest. EEG to evaluate seizures.  Level of alertness:Comatose  AEDs during EEG study:Phenobarb, LEV  Technical aspects: This EEG study was done with scalp electrodes positioned according to the 10-20 International system of electrode placement. Electrical activity was acquired at a sampling rate of 500Hz  and reviewed with a high frequency filter of 70Hz  and a low frequency filter of 1Hz . EEG data were recorded continuously and digitally stored.   Description:EEG initially showed generalized periodic epileptiform discharges with overriding fast activity at 1.25-1.5Hz  which gradually worsened to 2hz  alternating with generalized EEG attenuation.Hyperventilation and photic stimulation were not performed.   ABNORMALITY - Periodic epileptiform discharges, generalized ( GPDs) - Background attenuation, generalized  IMPRESSION: This studyshowed evidence of generalized epileptogenicity with high potential for seizures as well as profound diffuse encephalopathy. In the setting of cardiac arrest, this EEG is suggestive of anoxic/hypoxic brain injury.  Travis Maxwell 

## 2020-07-14 NOTE — Progress Notes (Signed)
Initial Nutrition Assessment  DOCUMENTATION CODES:   Non-severe (moderate) malnutrition in context of chronic illness  INTERVENTION:   Initiate tube feeding via OG tube: Vital AF 1.2 at 30 ml/h and increase by 10 ml every 8 hours to goal rate of 70 ml/hr (1680 ml per day)  Provides 2016 kcal, 126 gm protein, 1362 ml free water daily   NUTRITION DIAGNOSIS:   Moderate Malnutrition related to chronic illness (CAD, DM) as evidenced by moderate fat depletion,mild muscle depletion,severe muscle depletion.  GOAL:   Patient will meet greater than or equal to 90% of their needs  MONITOR:   TF tolerance  REASON FOR ASSESSMENT:   Consult Enteral/tube feeding initiation and management  ASSESSMENT:   Pt with PMH of HTN, DM, HLD, prior cardiac arrest 12/22, CAD s/p PCI to RCA, s/p aortic vlave replacement, COVID-19 9/2021CHF EF 20%, and A.fib admitted to [facility] 5/4 with OOH arrest TTM started, noted to have sz.   Pt discussed during ICU rounds and with RN.  No family present. Noted pt seen at [facility] by RD prior to tx to [facility].   5/5 Abd xray confirmed OG placement is gastric but also noted SB distention  5/6 TF initiated  5/9 Transferred to [facility] for LTM. Per neurology concern for extensive anoxic injury with minimal to no chances of meaningful neurologic recovery.   Patient is currently intubated on ventilator support MV: 12.4 L/min Temp (24hrs), Avg:98.5 F (36.9 C), Min:98.3 F (36.8 C), Max:98.7 F (37.1 C)  Medications reviewed and include: SSI, phenobarbital, keppra  Kphos, mag sulfate x 1 Labs reviewed: K+ 4.2, PO4: 3.5, Mg: 1.9 CBG's: 148-155 18 F OG tube; tip in stomach   NUTRITION - FOCUSED PHYSICAL EXAM:  Flowsheet Row Most Recent Value  Orbital Region Unable to assess  [bandaging around head for continuous eeg]  Upper Arm Region Moderate depletion  Thoracic and Lumbar Region Moderate depletion  Buccal Region Unable to assess  Temple Region Unable to assess  Clavicle  Bone Region Unable to assess  Clavicle and Acromion Bone Region Severe depletion  Scapular Bone Region Unable to assess  Dorsal Hand Unable to assess  Patellar Region Moderate depletion  Anterior Thigh Region Moderate depletion  Posterior Calf Region Mild depletion  Edema (RD Assessment) Mild  Hair Unable to assess  Eyes Unable to assess  Mouth Unable to assess  Skin Reviewed  Nails Unable to assess       Diet Order:   Diet Order            Diet NPO time specified  Diet effective now                 EDUCATION NEEDS:   No education needs have been identified at this time  Skin:  Skin Assessment: Reviewed RN Assessment  Last BM:  5/8  Height:   Ht Readings from Last 1 Encounters:  07/14/20 5\' 10"  (1.778 m)    Weight:   Wt Readings from Last 1 Encounters:  07/14/20 83.2 kg    Ideal Body Weight:  75.4 kg  BMI:  Body mass index is 26.32 kg/m.  Estimated Nutritional Needs:   Kcal:  2000  Protein:  120-135 grams  Fluid:  > 2 L/day  2001., RD, LDN, CNSC See AMiON for contact information

## 2020-07-14 NOTE — Progress Notes (Signed)
Heart Failure Nurse Navigator Progress Note  Navigation team following this hospitalization, pt admitted for cardiac arrest-brain injury. Will follow from a far pending improvement in neuro status.   Ozella Rocks, RN, BSN Heart Failure Nurse Navigator [phone]

## 2020-07-15 LAB — MAGNESIUM: Magnesium: 1.9 mg/dL (ref 1.7–2.4)

## 2020-07-15 LAB — BASIC METABOLIC PANEL
Anion gap: 12 (ref 5–15)
BUN: 18 mg/dL (ref 8–23)
CO2: 20 mmol/L — ABNORMAL LOW (ref 22–32)
Calcium: 9.1 mg/dL (ref 8.9–10.3)
Chloride: 106 mmol/L (ref 98–111)
Creatinine, Ser: 1.04 mg/dL (ref 0.61–1.24)
GFR, Estimated: >60 mL/min (ref >60)
Glucose, Bld: 181 mg/dL — ABNORMAL HIGH (ref 70–99)
Potassium: 4 mmol/L (ref 3.5–5.1)
Sodium: 138 mmol/L (ref 135–145)

## 2020-07-15 LAB — HEPARIN LEVEL (UNFRACTIONATED)
Heparin Unfractionated: 0.36 IU/mL (ref 0.30–0.70)
Heparin Unfractionated: 0.34 IU/mL (ref 0.30–0.70)

## 2020-07-15 LAB — GLUCOSE, CAPILLARY
Glucose-Capillary: 209 mg/dL — ABNORMAL HIGH (ref 70–99)
Glucose-Capillary: 180 mg/dL — ABNORMAL HIGH (ref 70–99)
Glucose-Capillary: 235 mg/dL — ABNORMAL HIGH (ref 70–99)
Glucose-Capillary: 176 mg/dL — ABNORMAL HIGH (ref 70–99)
Glucose-Capillary: 174 mg/dL — ABNORMAL HIGH (ref 70–99)
Glucose-Capillary: 208 mg/dL — ABNORMAL HIGH (ref 70–99)

## 2020-07-15 LAB — CBC
HCT: 47.2 % (ref 39.0–52.0)
Hemoglobin: 14 g/dL (ref 13.0–17.0)
MCH: 19.5 pg — ABNORMAL LOW (ref 26.0–34.0)
MCHC: 29.7 g/dL — ABNORMAL LOW (ref 30.0–36.0)
MCV: 65.7 fL — ABNORMAL LOW (ref 80.0–100.0)
Platelets: 475 10*3/uL — ABNORMAL HIGH (ref 150–400)
RBC: 7.18 MIL/uL — ABNORMAL HIGH (ref 4.22–5.81)
RDW: 25.5 % — ABNORMAL HIGH (ref 11.5–15.5)
WBC: 15.7 10*3/uL — ABNORMAL HIGH (ref 4.0–10.5)
nRBC: 0.1 % (ref 0.0–0.2)

## 2020-07-15 LAB — PHOSPHORUS: Phosphorus: 2.8 mg/dL (ref 2.5–4.6)

## 2020-07-15 MED ORDER — SODIUM CHLORIDE 0.9 % IV SOLN
2.0000 g | INTRAVENOUS | Status: DC
  Administered 2020-07-15: 2 g via INTRAVENOUS
  Filled 2020-07-15 (×2): qty 20

## 2020-07-15 MED ORDER — MAGNESIUM SULFATE 4 GM/100ML IV SOLN
4.0000 g | Freq: Once | INTRAVENOUS | Status: AC
  Administered 2020-07-15: 4 g via INTRAVENOUS
  Filled 2020-07-15: qty 100

## 2020-07-15 NOTE — Treatment Plan (Signed)
Spoke with patient's daughters at bedside and discussed goals of care. Options were given to proceed with tracheostomy vs comfort care. They would like some time to discuss among all siblings. At the moment they would like to keep patient on DNR status.  DNR orders were written.   Cheri Fowler MD [facility] See Amion for pager If no response to pager, please call [phone] until 7pm After 7pm, Please call E-link [phone]

## 2020-07-15 NOTE — Progress Notes (Signed)
ANTICOAGULATION CONSULT NOTE - Follow Up Consult  Pharmacy Consult for IV Heparin Indication: atrial fibrillation  Patient Measurements: Height: 5\' 10"  (177.8 cm) Weight: 82 kg (180 lb 12.4 oz) IBW/kg (Calculated) : 73 Heparin Dosing Weight: 78 kg  Vital Signs: Temp: 100.9 F (38.3 C) (05/11 0400) Temp Source: Axillary (05/11 0400) BP: 129/68 (05/11 0700) Pulse Rate: 92 (05/11 0700)  Labs: Recent Labs     0000 07/12/20 1700 08/03/2020 0000 08/03/2020 0433 07/19/2020 1125 07/18/2020 2241 07/14/20 0349 07/14/20 0352 07/14/20 0935 07/14/20 1907 07/15/20 0454  HGB   < >  --   --  12.3*  --   --  13.3 14.6  --   --  14.0  HCT   < >  --   --  40.9  --   --  43.1 43.0  --   --  47.2  PLT  --   --   --  440*  --   --  515*  --   --   --  475*  APTT  --  55* 60*  --  57*  --   --   --   --   --   --   HEPARINUNFRC  --   --   --  0.26*  --    < >  --   --  0.21* 0.24* 0.36  CREATININE  --   --   --  1.00  --   --  0.92  --   --   --  1.04   < > = values in this interval not displayed.    Estimated Creatinine Clearance: 70.2 mL/min (by C-G formula based on SCr of 1.04 mg/dL).   Medications:  Infusions:  . sodium chloride 10 mL/hr at 07/14/20 1900  . sodium chloride 250 mL (07/15/20 0251)  . amiodarone 30 mg/hr (07/15/20 0044)  . feeding supplement (VITAL AF 1.2 CAL) 1,000 mL (07/14/20 1339)  . heparin 1,650 Units/hr (07/14/20 2103)  . levETIRAcetam 1,000 mg (07/14/20 2133)    Assessment: 70 yr old man transferred from [facility] for higher level of care. Pt is s/p cardiac arrest on 5/4. Apparently, he was on a DOAC for afib PTA which baseline labs (heparin level >1.1) on admission would support. Currently using heparin levels to monitor anticoagulation.  Heparin level this morning is therapeutic (HL 0.36 << 0.24, goal of 0.3-0.7), CBC stable - no bleeding or issues noted per RN.  A repeat HL this afternoon was also therapeutic (HL 0.34) - with 2 therapeutic levels, will monitor  daily HL checks.   Goal of Therapy:  Heparin level 0.3-0.7 units/ml Monitor platelets by anticoagulation protocol: Yes   Plan:  - Continue Heparin at 1650 units/hr - Will continue to monitor for any signs/symptoms of bleeding and will follow up with heparin level in the AM  Thank you for allowing pharmacy to be a part of this patient's care.  7/4, PharmD, BCPS Clinical Pharmacist Clinical phone for 07/15/2020: 09/14/2020 07/15/2020 7:33 AM   **Pharmacist phone directory can now be found on [email or web address] ([email or web address]).  Listed under [facility].

## 2020-07-15 NOTE — Progress Notes (Signed)
LTM EEG discontinued - no skin breakdown at unhook.   

## 2020-07-15 NOTE — Progress Notes (Signed)
Subjective: No further seizures since this morning.  ROS: Unable to obtain due to poor mental status  Examination  Vital signs in last 24 hours: Temp:  [98.3 F (36.8 C)-101.4 F (38.6 C)] 101.4 F (38.6 C) (05/11 0800) Pulse Rate:  [81-113] 101 (05/11 0900) Resp:  [22-28] 25 (05/11 0900) BP: (118-153)/(66-94) 118/72 (05/11 0900) SpO2:  [96 %-98 %] 97 % (05/11 0900) FiO2 (%):  [30 %] 30 % (05/11 0801) Weight:  [82 kg] 82 kg (05/11 0500)  General: lying in bed, not in apparent distress CVS: pulse-normal rate and rhythm RS: Intubated, coarse breath sounds bilaterally Extremities: normal, warm Neuro: Comatose, does not open eyes to noxious stimuli, PERLA, corneal reflex intact bilaterally, gag reflex intact, does not withdraw to noxious stimuli in all 4 extremities  Basic Metabolic Panel: Recent Labs  Lab 07/11/20 0445 07/11/20 1715 07/12/20 0300 07/11/2020 0433 07/14/20 0349 07/14/20 0352 07/15/20 0454  NA 132*  --  137 138 139 139 138  K 4.5  --  4.4 4.1 4.3 4.2 4.0  CL 108  --  110 110 107  --  106  CO2 17*  --  18* 23 21*  --  20*  GLUCOSE 180*  --  190* 177* 149*  --  181*  BUN 25*  --  22 21 12   --  18  CREATININE 1.29*  --  1.11 1.00 0.92  --  1.04  CALCIUM 7.9*  --  8.2* 8.4* 8.9  --  9.1  MG 2.0 2.0 1.9 1.9 1.9  --  1.9  PHOS 4.1 3.0 2.3* 2.3* 3.5  --  2.8    CBC: Recent Labs  Lab 07/11/20 0445 07/12/20 0300 07/19/2020 0433 07/14/20 0349 07/14/20 0352 07/15/20 0454  WBC 17.4* 16.7* 15.4* 16.9*  --  15.7*  NEUTROABS 13.0*  --  10.3*  --   --   --   HGB 15.7 14.1 12.3* 13.3 14.6 14.0  HCT 51.6 47.4 40.9 43.1 43.0 47.2  MCV 65.9* 65.7* 65.4* 63.8*  --  65.7*  PLT 545* 599* 440* 515*  --  475*     Coagulation Studies: No results for input(s): LABPROT, INR in the last 72 hours.  Imaging No new brain imaging overnight  ASSESSMENT AND PLAN: 70 year old male status post out-of-hospital cardiac arrest.  TTM was started.  He was noted to have sporadic  twitching consistent with myoclonic seizures for which Keppra and eventually phenobarbital was added.  EEG continued to show generalized periodic epileptiform discharges.  Cardiac arrest Anoxic/hypoxic brain injury -LTM EEG showed one seizure on 07/16/2019 at 0612 with generalized onset during which patient was noted to have right hand shaking. Additionally EEG showed generalized periodic epileptiform discharges at 2 to 2.5 Hz which is on the ictal-interictal continuum with high potential for seizures as well as profound diffuse encephalopathy. In the setting of cardiac arrest, this EEG is suggestive of anoxic/hypoxic brain injury.  Recommendations -Continue Keppra and phenobarbital at current dose -We will DC LTM EEG and treat clinical seizures -Even though, MRI does not show the extent of anoxic/hypoxic brain injury, patient's history, exam and EEG findings are concerning for more extensive anoxic/hypoxic injury with minimal to no chances of meaningful neurologic recovery -continue seizure precautions -as needed IV Ativan 2 mg for clinical seizure-like activity -Management of rest of comorbidities per primary team  I have spent a total of 35  minutes with the patient reviewing hospital notes,  test results, labs and examining the patient as well  as establishing an assessment and plan. > 50% of time was spent in direct patient care.   Lindie Spruce Epilepsy [facility] For questions after 5pm please refer to AMION to reach the Neurologist on call

## 2020-07-15 NOTE — Progress Notes (Signed)
NAME:  Travis Maxwell, MRN:  [id number], DOB:  [birth year], LOS: 2 ADMISSION DATE:  07/11/2020, CONSULTATION DATE: 07/24/2020 REFERRING MD: Dr. Belia Heman from [facility], CHIEF COMPLAINT: Status post cardiac arrest, unresponsiveness  History of Present Illness:  Patient is unresponsive so most of the history is taken from chart review 70 year old male with prior history of cardiac arrest in December 2022 with VF/VT caused by prolonged QT in the setting of olanzapine, he presented to [facility] emergency department on 5/4 after he had V. fib cardiac arrest status post defibrillation x2 and epi x2. During his stay at [facility], patient had targeted therapeutic hypothermia, he was rewarmed on 5/6.  MRI brain was done which showed bilateral caudate nuclei involvement could be due to hypoxic/anoxic brain injury, EEG was done which was consistent with moderate to severe encephalopathy.  Patient was transferred to [facility] for further evaluation and continuous EEG monitoring as patient remained unresponsive.  Pertinent  Medical History  Hypertension, diabetes type 2 Hyperlipidemia Coronary artery disease status post PCI to RCA Status post bioprosthetic aortic valve replacement Systolic congestive heart failure, EF 20% Proximal A. fib  Significant Hospital Events: Including procedures, antibiotic start and stop dates in addition to other pertinent events    5/4 Admit to [facility] ICU for cardiac arrest. Cooled and paralyzed  5/5 Paralytics discontinued. EEG suggestive of anoxic brain injury with myoclonic seizure and generalized epileptogenicity. Central line placed for IV access  5/6 rewarming started at midnight  5/7 he is in maintenance of normothermia phase of targeted temperature management. Remains on mechanical ventilation with low requirements. He does not have significant vasopressor needs.   . 5/9, transferred from [facility], cEEG initiated, CVC, femoral aline, and foley  d/c'd, sedation off . 5/10, remains on cEEG, copious oral secretions, no neuro changes  Interim History / Subjective:   One episode of right hand tremor- cEEG consistent w/ seizure x1 Otherwise, no changes Remains off sedation Febrile 101.4, ongoing copious oral and thick tan ETT secretions, remains on minimal vent settings Doing well on PSV 10/5 this am WBC 16.9-> 15.7 Off cEEG this am   Objective   Blood pressure 118/72, pulse (!) 101, temperature (!) 101.4 F (38.6 C), temperature source Axillary, resp. rate (!) 25, height 5\' 10"  (1.778 m), weight 82 kg, SpO2 97 %.    Vent Mode: PRVC FiO2 (%):  [30 %] 30 % Set Rate:  [18 bmp] 18 bmp Vt Set:  [580 mL] 580 mL PEEP:  [5 cmH20] 5 cmH20 Plateau Pressure:  [10 cmH20-18 cmH20] 10 cmH20   Intake/Output Summary (Last 24 hours) at 07/15/2020 1020 Last data filed at 07/15/2020 0900 Gross per 24 hour  Intake 2273.88 ml  Output 1390 ml  Net 883.88 ml   Filed Weights   07/14/20 0627 07/15/20 0500  Weight: 83.2 kg 82 kg    Examination: General:  Critically ill male lying in bed unresponsive HEENT: MM pink/moist, ongoing copious, ETT, OGT, pupils 4/reactive, +corneal  Neuro: unresponsive CV: NSR PULM:  Non labored, coarse  GI: soft, bs+, NT, condom cath  Extremities: warm/dry, generalized edema Skin: no rashes   Labs/imaging that I havepersonally reviewed  (right click and "Reselect all SmartList Selections" daily)  BMET Mag 1.8 CBC 16.9-> 15.7  Resolved Hospital Problem list     Assessment & Plan:  Status post V. fib/V. tach cardiac arrest in the setting of prolonged QT Possible hypoxic/anoxic brain injury Seizures Acute hypoxic/hypercapnic respiratory failure Acute encephalopathy metabolic/anoxic Chronic systolic congestive  heart failure Diabetes type 2 Acute kidney injury, improving Hypophosphatemia Paroxysmal A. Fib Prolonged QTc  Patient is status post TTM at [facility] Echocardiogram showed EF 20% with global  hypokinesis  (previous not a ICD/ lifevest candidate per cards notes) MRI brain showed bilateral caudate nuclei signals could be related to hypoxic/anoxic brain injury  - remains off sedation since 5/9  Plan:  - cEEG d/c per neurology, appreciate input and neuro prognostication- felt 5/10, to have minimal to no chances of meaningful recovery - continue AEDs per neurology- phenobarbital and keppra - prn ativan seizure - Maintain neuro protective measures; goal for eurothermia, euglycemia, eunatermia, normoxia, and PCO2 goal of 35-40, ongoing nutrition and bowel regiment   - Continue MV support, 4-8cc/kg IBW with goal Pplat <30 and DP<15  - VAP prevention protocol/ PPI - PAD protocol for sedation> not needed - wean FiO2 as able for SpO2 >92%  - daily SBT-> doing well today on PSV however mental status is barrier to extubation - given fever and secretions, will send for trach asp and start CTX - continue heparin gtt per pharmacy and amio gtt - defer cards consult at [facility] for now pending neuro goals  - minimize QTc prolonging meds - additional Mag 4 gm today for goal Mag > 2 and K > 4 - trend UOP/ bladder scan prn  - trend fever curve/ CBC/ and renal indices  - ongoing supportive care.  If not improvements by tomorrow, will need to persue further GOC discussion and possible transition to comfort care 5/12   Best practice (right click and "Reselect all SmartList Selections" daily)  Diet:  NPO, TF Pain/Anxiety/Delirium protocol (if indicated): No, not needed VAP protocol (if indicated): Yes DVT prophylaxis: Systemic AC GI prophylaxis: H2B Glucose control:  SSI Yes Central venous access:  N/A Arterial line:  N/A Foley:  N/A Mobility:  bed rest  PT consulted: N/A Last date of multidisciplinary goals of care discussion: per Dr. Merrily Pew, who has been speaking with daughter [place] Sink, who is his stated HPOA Code Status:  full code Disposition: ICU  Labs   CBC: Recent Labs  Lab 07/11/20 0445  07/12/20 0300 07/11/2020 0433 07/14/20 0349 07/14/20 0352 07/15/20 0454  WBC 17.4* 16.7* 15.4* 16.9*  --  15.7*  NEUTROABS 13.0*  --  10.3*  --   --   --   HGB 15.7 14.1 12.3* 13.3 14.6 14.0  HCT 51.6 47.4 40.9 43.1 43.0 47.2  MCV 65.9* 65.7* 65.4* 63.8*  --  65.7*  PLT 545* 599* 440* 515*  --  475*    Basic Metabolic Panel: Recent Labs  Lab 07/11/20 0445 07/11/20 1715 07/12/20 0300 07/21/2020 0433 07/14/20 0349 07/14/20 0352 07/15/20 0454  NA 132*  --  137 138 139 139 138  K 4.5  --  4.4 4.1 4.3 4.2 4.0  CL 108  --  110 110 107  --  106  CO2 17*  --  18* 23 21*  --  20*  GLUCOSE 180*  --  190* 177* 149*  --  181*  BUN 25*  --  22 21 12   --  18  CREATININE 1.29*  --  1.11 1.00 0.92  --  1.04  CALCIUM 7.9*  --  8.2* 8.4* 8.9  --  9.1  MG 2.0 2.0 1.9 1.9 1.9  --  1.9  PHOS 4.1 3.0 2.3* 2.3* 3.5  --  2.8   GFR: Estimated Creatinine Clearance: 70.2 mL/min (by C-G formula based on SCr of  1.04 mg/dL). Recent Labs  Lab 07/08/20 1846 07/08/20 2159 07/09/20 0124 07/09/20 0414 07/10/20 0338 07/10/20 0936 07/10/20 1139 07/11/20 0445 07/11/20 1652 07/12/20 0300 Jul 30, 2020 0433 07/14/20 0349 07/15/20 0454  PROCALCITON 0.10  --  2.54  --   --   --   --   --   --   --   --   --   --   WBC  --   --  31.6*   < >  --   --   --    < >  --  16.7* 15.4* 16.9* 15.7*  LATICACIDVEN  --    < >  --    < > 2.2* 2.2* 2.0*  --  1.3  --   --   --   --    < > = values in this interval not displayed.    Liver Function Tests: Recent Labs  Lab 07/08/20 1806 07/10/20 0812 07/10/20 1622 Jul 30, 2020 0433  AST 97* 79* 77* 32  ALT 79* 94* 100* 34  ALKPHOS 95 75 83 72  BILITOT 0.7 0.9 0.7 0.8  PROT 7.5 6.6 7.1 6.1*  ALBUMIN 3.7 3.1* 3.4* 2.6*   No results for input(s): LIPASE, AMYLASE in the last 168 hours. No results for input(s): AMMONIA in the last 168 hours.  ABG    Component Value Date/Time   PHART 7.511 (H) 07/14/2020 0352   PCO2ART 26.3 (L) 07/14/2020 0352   PO2ART 85 07/14/2020  0352   HCO3 21.0 07/14/2020 0352   TCO2 22 07/14/2020 0352   ACIDBASEDEF 8.0 (H) 07/11/2020 1202   O2SAT 98.0 07/14/2020 0352     Coagulation Profile: Recent Labs  Lab 07/08/20 1846 07/09/20 0122  INR 1.6* 1.7*    Cardiac Enzymes: No results for input(s): CKTOTAL, CKMB, CKMBINDEX, TROPONINI in the last 168 hours.  HbA1C: Hgb A1c MFr Bld  Date/Time Value Ref Range Status  07/08/2020 09:59 PM 6.2 (H) 4.8 - 5.6 % Final    Comment:    (NOTE) Pre diabetes:          5.7%-6.4%  Diabetes:              >6.4%  Glycemic control for   <7.0% adults with diabetes     CBG: Recent Labs  Lab 07/14/20 1121 07/14/20 1518 07/14/20 1918 07/14/20 2323 07/15/20 0321  GLUCAP 148* 181* 160* 198* 209*     Critical care time: 30 mins       Posey Boyer, ACNP [facility] 07/15/2020, 10:20 AM

## 2020-07-15 NOTE — Progress Notes (Signed)
LTM EEG D/C, No skin breakdown noted

## 2020-07-15 NOTE — Procedures (Addendum)
Patient Name:Travis Maxwell TMH:[id number] Epilepsy Attending:Wakisha Alberts Annabelle Harman Referring Physician/Provider:Dr.Salman Derry Lory Duration:07/14/2020 1631 to 07/15/2020 9211  Patient history:70 year old male status post V. fib arrest. EEG to evaluate seizures.  Level of alertness:Comatose  AEDs during EEG study:Phenobarb, LEV  Technical aspects: This EEG study was done with scalp electrodes positioned according to the 10-20 International system of electrode placement. Electrical activity was acquired at a sampling rate of 500Hz  and reviewed with a high frequency filter of 70Hz  and a low frequency filter of 1Hz . EEG data were recorded continuously and digitally stored.   Description:EEG showed generalized periodic epileptiform discharges with overriding fast activity at2-2.5Hz  alternating with generalized EEG attenuation. Generalized periodic discharges at times appeared rhythmic lasting 2 to 3 seconds consistent with brief rhythmic ictal-interictal rhythmic discharges (BIRDs). Hyperventilation and photic stimulation were not performed.   Event button was pressed on 07/15/2020 at 0612 for right hand shaking.  Concomitant EEG showed generalized periodic discharges at 3 Hz which appeared rhythmic.   ABNORMALITY - Seizure, generalized - Periodic epileptiform discharges, generalized ( GPDs) - Background attenuation,generalized  IMPRESSION: This studyshowed one seizure on 07/16/2019 at 0612 with generalized onset during which patient was noted to have right hand shaking.  Additionally EEG showed generalized periodic epileptiform discharges at 2 to 2.5 Hz which is on the ictal-interictal continuum with high potential for seizures as well as profound diffuse encephalopathy. In the setting of cardiac arrest, this EEG is suggestive of anoxic/hypoxic brain injury.  Travis Maxwell 

## 2020-07-16 ENCOUNTER — Other Ambulatory Visit: Payer: Self-pay

## 2020-07-16 DIAGNOSIS — Z978 Presence of other specified devices: Secondary | ICD-10-CM

## 2020-07-16 DIAGNOSIS — Z515 Encounter for palliative care: Secondary | ICD-10-CM

## 2020-07-16 LAB — CBC
HCT: 47.5 % (ref 39.0–52.0)
Hemoglobin: 14.1 g/dL (ref 13.0–17.0)
MCH: 19.7 pg — ABNORMAL LOW (ref 26.0–34.0)
MCHC: 29.7 g/dL — ABNORMAL LOW (ref 30.0–36.0)
MCV: 66.4 fL — ABNORMAL LOW (ref 80.0–100.0)
Platelets: 323 10*3/uL (ref 150–400)
RBC: 7.15 MIL/uL — ABNORMAL HIGH (ref 4.22–5.81)
RDW: 25.6 % — ABNORMAL HIGH (ref 11.5–15.5)
WBC: 18.3 10*3/uL — ABNORMAL HIGH (ref 4.0–10.5)
nRBC: 0.1 % (ref 0.0–0.2)

## 2020-07-16 LAB — GLUCOSE, CAPILLARY
Glucose-Capillary: 184 mg/dL — ABNORMAL HIGH (ref 70–99)
Glucose-Capillary: 196 mg/dL — ABNORMAL HIGH (ref 70–99)
Glucose-Capillary: 179 mg/dL — ABNORMAL HIGH (ref 70–99)

## 2020-07-16 LAB — BASIC METABOLIC PANEL
Anion gap: 13 (ref 5–15)
BUN: 21 mg/dL (ref 8–23)
CO2: 20 mmol/L — ABNORMAL LOW (ref 22–32)
Calcium: 9 mg/dL (ref 8.9–10.3)
Chloride: 109 mmol/L (ref 98–111)
Creatinine, Ser: 1 mg/dL (ref 0.61–1.24)
GFR, Estimated: >60 mL/min (ref >60)
Glucose, Bld: 160 mg/dL — ABNORMAL HIGH (ref 70–99)
Potassium: 3.8 mmol/L (ref 3.5–5.1)
Sodium: 142 mmol/L (ref 135–145)

## 2020-07-16 LAB — MAGNESIUM: Magnesium: 2.2 mg/dL (ref 1.7–2.4)

## 2020-07-16 LAB — HEPARIN LEVEL (UNFRACTIONATED): Heparin Unfractionated: 0.31 IU/mL (ref 0.30–0.70)

## 2020-07-16 MED ORDER — GLYCOPYRROLATE 1 MG PO TABS
1.0000 mg | ORAL_TABLET | ORAL | Status: DC | PRN
  Filled 2020-07-16: qty 1

## 2020-07-16 MED ORDER — POLYVINYL ALCOHOL 1.4 % OP SOLN
1.0000 [drp] | Freq: Four times a day (QID) | OPHTHALMIC | Status: DC | PRN
  Filled 2020-07-16: qty 15

## 2020-07-16 MED ORDER — ACETAMINOPHEN 325 MG PO TABS: 650.0000 mg | ORAL_TABLET | Freq: Four times a day (QID) | ORAL | Status: DC | PRN

## 2020-07-16 MED ORDER — SCOPOLAMINE 1 MG/3DAYS TD PT72: 1.0000 | MEDICATED_PATCH | TRANSDERMAL | Status: DC

## 2020-07-16 MED ORDER — DIPHENHYDRAMINE HCL 50 MG/ML IJ SOLN: 25.0000 mg | INTRAMUSCULAR | Status: DC | PRN

## 2020-07-16 MED ORDER — GLYCOPYRROLATE 0.2 MG/ML IJ SOLN: 0.2000 mg | INTRAMUSCULAR | Status: DC | PRN

## 2020-07-16 MED ORDER — ACETAMINOPHEN 650 MG RE SUPP: 650.0000 mg | Freq: Four times a day (QID) | RECTAL | Status: DC | PRN

## 2020-07-16 MED ORDER — FENTANYL BOLUS VIA INFUSION
25.0000 ug | INTRAVENOUS | Status: DC | PRN
  Administered 2020-07-16: 50 ug via INTRAVENOUS
  Administered 2020-07-16 (×2): 100 ug via INTRAVENOUS
  Filled 2020-07-16: qty 100

## 2020-07-16 MED ORDER — FENTANYL 2500MCG IN NS 250ML (10MCG/ML) PREMIX INFUSION
25.0000 ug/h | INTRAVENOUS | Status: DC
  Administered 2020-07-16: 25 ug/h via INTRAVENOUS
  Filled 2020-07-16: qty 250

## 2020-07-16 MED ORDER — FENTANYL CITRATE (PF) 100 MCG/2ML IJ SOLN
25.0000 ug | Freq: Once | INTRAMUSCULAR | Status: DC
Start: 2020-07-16 — End: 2020-07-17

## 2020-07-16 MED ORDER — POTASSIUM CHLORIDE 20 MEQ PO PACK
40.0000 meq | PACK | Freq: Once | ORAL | Status: AC
  Administered 2020-07-16: 40 meq
  Filled 2020-07-16: qty 2

## 2020-07-16 MED ORDER — GLYCOPYRROLATE 0.2 MG/ML IJ SOLN
0.2000 mg | INTRAMUSCULAR | Status: DC | PRN
  Administered 2020-07-16 (×2): 0.2 mg via INTRAVENOUS
  Filled 2020-07-16 (×2): qty 1

## 2020-07-16 MED ORDER — LORAZEPAM 2 MG/ML IJ SOLN
2.0000 mg | INTRAMUSCULAR | Status: DC | PRN
  Administered 2020-07-16 (×3): 2 mg via INTRAVENOUS
  Filled 2020-07-16 (×5): qty 1

## 2020-07-16 NOTE — Progress Notes (Signed)
NAME:  Travis Maxwell, MRN:  [id number], DOB:  1951/02/15, LOS: 3 ADMISSION DATE:  07-24-2020, CONSULTATION DATE: 07/24/20 REFERRING MD: Dr. Belia Heman from [facility], CHIEF COMPLAINT: Status post cardiac arrest, unresponsiveness  History of Present Illness:  Patient is unresponsive so most of the history is taken from chart review 70 year old male with prior history of cardiac arrest in December 2022 with VF/VT caused by prolonged QT in the setting of olanzapine, he presented to [facility] emergency department on 5/4 after he had V. fib cardiac arrest status post defibrillation x2 and epi x2. During his stay at [facility], patient had targeted therapeutic hypothermia, he was rewarmed on 5/6.  MRI brain was done which showed bilateral caudate nuclei involvement could be due to hypoxic/anoxic brain injury, EEG was done which was consistent with moderate to severe encephalopathy.  Patient was transferred to [facility] for further evaluation and continuous EEG monitoring as patient remained unresponsive.  Pertinent  Medical History  Hypertension, diabetes type 2 Hyperlipidemia Coronary artery disease status post PCI to RCA Status post bioprosthetic aortic valve replacement Systolic congestive heart failure, EF 20% Proximal A. fib  Significant Hospital Events: Including procedures, antibiotic start and stop dates in addition to other pertinent events    5/4 Admit to [facility] ICU for cardiac arrest. Cooled and paralyzed  5/5 Paralytics discontinued. EEG suggestive of anoxic brain injury with myoclonic seizure and generalized epileptogenicity. Central line placed for IV access  5/6 rewarming started at midnight  5/7 he is in maintenance of normothermia phase of targeted temperature management. Remains on mechanical ventilation with low requirements. He does not have significant vasopressor needs.  5/9, transferred from [facility], cEEG initiated, CVC, femoral aline, and foley d/c'd,  sedation off  5/10, remains on cEEG, copious oral secretions, no neuro changes  5/12 Tmax 101.3, remains with copious oral secretions, remains off sedation since 5/11  Interim History / Subjective:  T-max 101.3 overnight with bump in WBC  Remains off sedation   Objective   Blood pressure (!) 129/91, pulse 83, temperature 97.9 F (36.6 C), resp. rate (!) 22, height 5\' 10"  (1.778 m), weight 80.3 kg, SpO2 97 %.    Vent Mode: PRVC FiO2 (%):  [30 %] 30 % Set Rate:  [18 bmp] 18 bmp Vt Set:  [580 mL] 580 mL PEEP:  [5 cmH20] 5 cmH20 Pressure Support:  [10 cmH20] 10 cmH20 Plateau Pressure:  [10 cmH20-22 cmH20] 13 cmH20   Intake/Output Summary (Last 24 hours) at 07/16/2020 0730 Last data filed at 07/16/2020 0700 Gross per 24 hour  Intake 2710.49 ml  Output 1065 ml  Net 1645.49 ml   Filed Weights   07/14/20 0627 07/15/20 0500 07/16/20 0436  Weight: 83.2 kg 82 kg 80.3 kg    Examination: General: Acute on chronically ill appearing elderly male on mechanical ventilation, in NAD HEENT: ETT, MM pink/moist, PERRL,  Neuro: Unresponsive, not currently on any sedation, cough with tracheal suction CV: s1s2 regular rate and rhythm, no murmur, rubs, or gallops,  PULM:  Bilateral rhonchi, tolerating vent well, copious oral secretions  GI: soft, bowel sounds active in all 4 quadrants, non-tender, non-distended, tolerating TF Extremities: warm/dry, no edema  Skin: no rashes or lesions  Labs/imaging that I have personally reviewed   WBC 18.3  Resolved Hospital Problem list   Hypophosphatemia  Assessment & Plan:  Status post V. fib/V. tach cardiac arrest in the setting of prolonged QT -S/P TTM at [facility] -Echocardiogram showed EF 20% with global hypokinesis  (  previous not a ICD/ lifevest candidate per cards notes) - remains off sedation since 5/9 Prolonged QTc  P: Supportive care  Continuous telemetry   Very high concern for hypoxic/anoxic brain injury -MRI brain showed bilateral caudate  nuclei signals could be related to hypoxic/anoxic brain injury  -Appreciate input and neuro prognostication- felt 5/10, to have minimal to no chances of meaningful recovery Seizures Acute encephalopathy metabolic/anoxic P: Management per neurology  Maintain neuro protective measures; goal for eurothermia, euglycemia, eunatermia, normoxia, and PCO2 goal of 35-40 Nutrition and bowel regiment  Seizure precautions  AEDs per neurology  Aspirations precautions   Acute hypoxic/hypercapnic respiratory failure Leukocytosis with concern for PNA P: Continue ventilator support with lung protective strategies  Wean PEEP and FiO2 for sats greater than 90%. Head of bed elevated 30 degrees. Plateau pressures less than 30 cm H20.  Follow intermittent chest x-ray and ABG.   Ensure adequate pulmonary hygiene  VAP bundle in place Follow tracheal aspirate  Trend CBC and fever curve  Continue empiric Ceftriaxone   Chronic systolic congestive heart failure Paroxysmal A. Fib P: Daily weight  Strict intake and output  Amiodarone drip  Heparin drip Continuous telemetry  Close monitoring of volume status   Diabetes type 2 P: Continue SSI  CBG q4  Acute kidney injury, improving P: Follow renal function / urine output Trend Bmet Avoid nephrotoxins Ensure adequate renal perfusion   Best practice   Diet:  NPO, TF Pain/Anxiety/Delirium protocol (if indicated): No, not needed VAP protocol (if indicated): Yes DVT prophylaxis: Systemic AC GI prophylaxis: H2B Glucose control:  SSI Yes Central venous access:  N/A Arterial line:  N/A Foley:  N/A Mobility:  bed rest  PT consulted: N/A Last date of multidisciplinary goals of care discussion: per Dr. Merrily Pew, who has been speaking with daughter Wahoo Sink, who is his stated HPOA Code Status:  full code Disposition: ICU  Critical care time:   Performed by: Delfin Gant  Total critical care time: 34 minutes  Critical care time was exclusive of  separately billable procedures and treating other patients.  Critical care was necessary to treat or prevent imminent or life-threatening deterioration.  Critical care was time spent personally by me on the following activities: development of treatment plan with patient and/or surrogate as well as nursing, discussions with consultants, evaluation of patient's response to treatment, examination of patient, obtaining history from patient or surrogate, ordering and performing treatments and interventions, ordering and review of laboratory studies, ordering and review of radiographic studies, pulse oximetry and re-evaluation of patient's condition.  Delfin Gant, NP-C [facility] Personal contact information can be found on Amion  07/16/2020, 7:44 AM

## 2020-07-16 NOTE — Progress Notes (Signed)
Pharmacy Electrolyte Replacement  Recent Labs:  Recent Labs    07/15/20 0454 07/16/20 0337  K 4.0 3.8  MG 1.9 2.2  PHOS 2.8  --   CREATININE 1.04 1.00    Low Critical Values (K </= 2.5, Phos </= 1, Mg </= 1) Present: None  MD Contacted:   Plan:  - K 3.8 - will give 40 mEq PT x 1 with history of prolonged QTc - F/u K/Mg with AM labs  Georgina Pillion, PharmD, BCPS  7:25 AM

## 2020-07-16 NOTE — Progress Notes (Signed)
CCM providers spoke with family and then gave orders for comfort care measures.  Will start fentanyl gtt and turn off everything else.  We are waiting for family to extubate.

## 2020-07-16 NOTE — Procedures (Signed)
Extubation Procedure Note  Patient Details:   Name: Travis Maxwell DOB: 10-13-1950 MRN: [id number]   Airway Documentation:    Vent end date: 07/16/20 Vent end time: 1935   Evaluation  O2 sats: stable throughout Complications: No apparent complications Patient did tolerate procedure well. Bilateral Breath Sounds: Rhonchi   No   Comfort care extubation preformed by RT at this time. No complications noted.  Hiram Comber 07/16/2020, 7:40 PM

## 2020-07-16 NOTE — Progress Notes (Signed)
ANTICOAGULATION CONSULT NOTE - Follow Up Consult  Pharmacy Consult for IV Heparin Indication: atrial fibrillation  Patient Measurements: Height: 5\' 10"  (177.8 cm) Weight: 80.3 kg (177 lb 0.5 oz) IBW/kg (Calculated) : 73 Heparin Dosing Weight: 78 kg  Vital Signs: Temp: 97.9 F (36.6 C) (05/12 0400) Temp Source: Axillary (05/12 0000) BP: 129/91 (05/12 0700) Pulse Rate: 83 (05/12 0700)  Labs: Recent Labs    2020-07-14 1125 07/14/2020 2241 07/14/20 0349 07/14/20 0352 07/14/20 0935 07/15/20 0454 07/15/20 1225 07/16/20 0337  HGB  --    < > 13.3 14.6  --  14.0  --  14.1  HCT  --    < > 43.1 43.0  --  47.2  --  47.5  PLT  --   --  515*  --   --  475*  --  323  APTT 57*  --   --   --   --   --   --   --   HEPARINUNFRC  --    < >  --   --    < > 0.36 0.34 0.31  CREATININE  --   --  0.92  --   --  1.04  --  1.00   < > = values in this interval not displayed.    Estimated Creatinine Clearance: 73 mL/min (by C-G formula based on SCr of 1 mg/dL).   Medications:  Infusions:  . sodium chloride Stopped (07/15/20 2100)  . sodium chloride 500 mL (07/16/20 0015)  . amiodarone 30 mg/hr (07/16/20 0016)  . cefTRIAXone (ROCEPHIN)  IV Stopped (07/15/20 1330)  . feeding supplement (VITAL AF 1.2 CAL) 70 mL/hr at 07/16/20 0400  . heparin 1,650 Units/hr (07/15/20 2026)  . levETIRAcetam 1,000 mg (07/15/20 2303)    Assessment: 70 yr old man transferred from [facility] for higher level of care. Pt is s/p cardiac arrest on 5/4. Apparently, he was on a DOAC for afib PTA which baseline labs (heparin level >1.1) on admission would support. Currently using heparin levels to monitor anticoagulation.  Heparin level this morning remains therapeutic (HL 0.31 << 0.34, goal of 0.3-0.7), CBC stable - no bleeding or issues noted per RN.  Goal of Therapy:  Heparin level 0.3-0.7 units/ml Monitor platelets by anticoagulation protocol: Yes   Plan:  - Continue Heparin at 1650 units/hr - Will continue to monitor  for any signs/symptoms of bleeding and will follow up with heparin level in the AM  Thank you for allowing pharmacy to be a part of this patient's care.  7/4, PharmD, BCPS Clinical Pharmacist Clinical phone for 07/16/2020: 09/15/2020 07/16/2020 7:20 AM   **Pharmacist phone directory can now be found on [email or web address] ([email or web address]).  Listed under [facility].

## 2020-07-18 LAB — CULTURE, RESPIRATORY W GRAM STAIN

## 2020-07-20 ENCOUNTER — Encounter: Payer: Self-pay | Admitting: Cardiovascular Disease

## 2020-08-05 NOTE — Progress Notes (Signed)
No ECG or respiration waveforms on monitor. This RN and Altamese Wauseon RN listened for heart and long sounds for 1 minute. None auscultated. Time of death at 62.  [employer] physician notified. [employer] notified. Two granddaughters at bedside.

## 2020-08-05 NOTE — Death Summary Note (Signed)
DEATH SUMMARY   Patient Details  Name: Travis Maxwell MRN: [id number] DOB: 10/31/1950  Admission/Discharge Information   Admit Date:  08/11/2020  Date of Death: Date of Death: 08-15-20  Time of Death: Time of Death: 0517  Length of Stay: 4  Referring Physician: Pcp, No   Reason(s) for Hospitalization  Status post V. fib/V. tach cardiac arrest in setting of prolonged QT Acute hypoxic/anoxic brain injury Myoclonic seizures Acute encephalopathy metabolic/anoxic Acute hypoxic/hypercapnic respiratory failure Possible aspiration pneumonia Chronic systolic congestive heart failure Paroxysmal A. fib Diabetes type 2 AKI   Diagnoses  Preliminary cause of death: V. fib/V. tach cardiac arrest in the setting of prolonged QT with resulting anoxic brain injury Secondary Diagnoses (including complications and co-morbidities):  Active Problems:   Cardiac arrest [facility])   Brief Hospital Course (including significant findings, care, treatment, and services provided and events leading to death)  Travis Maxwell is a 70 y.o. year old male with prior history of cardiac arrest in December 2022 with VF/VT caused by prolonged QT in the setting of olanzapine, he presented to [facility] emergency department on 5/4 after he had V. fib cardiac arrest status post defibrillation x2 and epi x2. During his stay at [facility], patient had targeted therapeutic hypothermia, he was rewarmed on 5/6.  MRI brain was done which showed bilateral caudate nuclei involvement could be due to hypoxic/anoxic brain injury, EEG was done which was consistent with moderate to severe encephalopathy.  Patient was transferred to [facility] for further evaluation and continuous EEG monitoring as patient remained unresponsive. Patient had a continuous EEG for 24 hours which did not show active seizures but consistent with diffuse encephalopathy and triphasic waves which was consistent with anoxic brain  injury Had prolonged discussion with patient's daughters and son, initially patient was made DNR, as patient was not waking up with MRI and EEG was consistent with probable hypoxic/anoxic brain injury, patient's family decided to proceed with comfort care and do not escalate care.  Patient was palliatively extubated on 11/16/2020 at 7:35 PM, his heart is stopped and was declared dead on 15-Aug-2020 at 68:17 AM  Pertinent Labs and Studies  Significant Diagnostic Studies DG Abd 1 View  Result Date: 07/09/2020 CLINICAL DATA:  NG tube placement. EXAM: ABDOMEN - 1 VIEW COMPARISON:  07/08/2020. FINDINGS: NG tube noted with tip over the stomach. Gastric distention noted. Debris in the stomach may be present. Small-bowel distention also noted. Colonic gas pattern is normal. No free air. No acute bony abnormality. IMPRESSION: NG tube noted with tip over the stomach. Moderate gastric distention. Debris may be present in the stomach. Small-bowel distention is also noted. Follow-up abdominal series suggested to demonstrate resolution of gastric and small bowel distention. Electronically Signed   By: Maisie Fus  Register   On: 07/09/2020 16:19   DG Abd 1 View  Result Date: 07/08/2020 CLINICAL DATA:  Orogastric tube placement EXAM: ABDOMEN - 1 VIEW COMPARISON:  02/02/2020 FINDINGS: Nasogastric tube is in place with its tip within the expected proximal body of the stomach and its proximal side hole in the expected location of the gastroesophageal junction. There is mild gaseous distension of the stomach. Thel pelvis is excluded from view. 8 mm stable calcification overlies the right kidney. Mild cardiomegaly is stable. Aortic valve replacement has been performed. IMPRESSION: Nasogastric tube just within the proximal body of the stomach. Advancement by 5-10 cm may better position the catheter within the gastric lumen. Electronically Signed   By: Helyn Numbers MD  On: 07/08/2020 23:42   CT Head Wo Contrast  Result Date:  07/08/2020 CLINICAL DATA:  Altered mental status EXAM: CT HEAD WITHOUT CONTRAST TECHNIQUE: Contiguous axial images were obtained from the base of the skull through the vertex without intravenous contrast. COMPARISON:  02/13/2020 FINDINGS: Brain: Normal anatomic configuration. Parenchymal volume loss is commensurate with the patient's age. Mild periventricular white matter changes are present likely reflecting the sequela of small vessel ischemia. Right parietal encephalomalacia is unchanged. 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 No abnormal intra or extra-axial mass lesion or fluid collection. No abnormal mass effect or midline shift. No evidence of acute intracranial hemorrhage or infarct. Ventricular size is normal. Cerebellum unremarkable. Vascular: No asymmetric hyperdense vasculature at the skull base. Skull: Intact Sinuses/Orbits: Paranasal sinuses are clear. Orbits are unremarkable. Other: Mastoid air cells and middle ear cavities are clear. IMPRESSION: No acute intracranial abnormality. Stable senescent changes and remote infarcts. Electronically Signed   By: Helyn Numbers MD   On: 07/08/2020 20:20   MR BRAIN WO CONTRAST  Result Date: 07/12/2020 CLINICAL DATA:  Heart disease.  Anoxic brain injury. EXAM: MRI HEAD WITHOUT CONTRAST TECHNIQUE: Multiplanar, multiecho pulse sequences of the brain and surrounding structures were obtained without intravenous contrast. COMPARISON:  Head CT 07/08/2020 FINDINGS: Brain: Diffusion imaging shows low level restricted diffusion in both caudate nuclei consistent with recent hypoxic ischemic injury. Question lower level insult within both thalami and possibly within the hippocampus on both sides. No other widespread insult identified. Brainstem appears unremarkable. There are old small vessel infarctions affecting the cerebellum. Old infarction in the medial right thalamus. Chronic small-vessel ischemic changes of  the cerebral hemispheric white matter. Old right parietal cortical and subcortical infarction. No evidence of mass, hemorrhage, hydrocephalus or extra-axial collection. Vascular: Major vessels at the base of the brain show flow. Skull and upper cervical spine: Negative Sinuses/Orbits: Sinuses are clear. Fluid in the nasopharynx, often seen with intubation. Small bilateral mastoid effusions, often seen with intubation. Orbits negative. Other: None IMPRESSION: Low level restricted diffusion within the caudate nuclei consistent with hypoxic ischemic injury. Question lower level involvement of both thalami and both hippocampi. No gross brain swelling. No hemorrhage. Old ischemic changes elsewhere throughout the brain as outlined above. Electronically Signed   By: Paulina Fusi M.D.   On: 07/12/2020 14:08   US Venous Img Lower Bilateral (DVT)  Result Date: 07/10/2020 CLINICAL DATA:  Bilateral lower extremity edema. EXAM: BILATERAL LOWER EXTREMITY VENOUS DOPPLER ULTRASOUND TECHNIQUE: Gray-scale sonography with graded compression, as well as color Doppler and duplex ultrasound were performed to evaluate the lower extremity deep venous systems from the level of the common femoral vein and including the common femoral, femoral, profunda femoral, popliteal and calf veins including the posterior tibial, peroneal and gastrocnemius veins when visible. The superficial great saphenous vein was also interrogated. Spectral Doppler was utilized to evaluate flow at rest and with distal augmentation maneuvers in the common femoral, femoral and popliteal veins. COMPARISON:  None. FINDINGS: RIGHT LOWER EXTREMITY Common Femoral Vein: No evidence of thrombus. Normal compressibility, respiratory phasicity and response to augmentation. Saphenofemoral Junction: No evidence of thrombus. Normal compressibility and flow on color Doppler imaging. Profunda Femoral Vein: No evidence of thrombus. Normal compressibility and flow on color Doppler  imaging. Femoral Vein: No evidence of thrombus. Normal compressibility, respiratory phasicity and response to augmentation. Popliteal Vein: No evidence of thrombus. Normal compressibility, respiratory phasicity and response to augmentation. Calf Veins: No evidence of thrombus. Normal  compressibility and flow on color Doppler imaging. Superficial Great Saphenous Vein: No evidence of thrombus. Normal compressibility. Venous Reflux:  None. Other Findings: No evidence of superficial thrombophlebitis or abnormal fluid collection. LEFT LOWER EXTREMITY Common Femoral Vein: No evidence of thrombus. Normal compressibility, respiratory phasicity and response to augmentation. Saphenofemoral Junction: No evidence of thrombus. Normal compressibility and flow on color Doppler imaging. Profunda Femoral Vein: No evidence of thrombus. Normal compressibility and flow on color Doppler imaging. Femoral Vein: No evidence of thrombus. Normal compressibility, respiratory phasicity and response to augmentation. Popliteal Vein: No evidence of thrombus. Normal compressibility, respiratory phasicity and response to augmentation. Calf Veins: No evidence of thrombus. Normal compressibility and flow on color Doppler imaging. Superficial Great Saphenous Vein: No evidence of thrombus. Normal compressibility. Venous Reflux:  None. Other Findings: No evidence of superficial thrombophlebitis or abnormal fluid collection. IMPRESSION: No evidence of deep venous thrombosis in either lower extremity. Electronically Signed   By: Irish Lack M.D.   On: 07/10/2020 08:01   DG Chest Port 1 View  Result Date: 26-Jul-2020 CLINICAL DATA:  Endotracheal tube present. EXAM: PORTABLE CHEST 1 VIEW COMPARISON:  Radiograph yesterday. FINDINGS: Endotracheal tube tip is 4.5 cm from the carina. Enteric tube in place with tip and side-port below the diaphragm in the stomach. Stable positioning of right internal jugular central venous catheter. Again seen cardiomegaly  with prosthetic aortic valve. There are developing patchy bibasilar opacities and obscuration of the left hemidiaphragm. Slight vascular congestion without pulmonary edema. No pneumothorax. Prior median sternotomy. Benign-appearing lesion in the right proximal humerus. IMPRESSION: 1. Endotracheal tube tip 4.5 cm from the carina. Enteric tube in place with tip and side-port below the diaphragm in the stomach. 2. Developing patchy bibasilar opacities with obscuration of the left hemidiaphragm, likely combination of pleural effusion and atelectasis/pneumonia. 3. Stable cardiomegaly. Electronically Signed   By: Narda Rutherford M.D.   On: 2020-07-26 16:50   DG Chest Port 1 View  Result Date: 07/12/2020 CLINICAL DATA:  71 year old male with shortness of breath. EXAM: PORTABLE CHEST 1 VIEW COMPARISON:  Chest radiograph dated 07/11/2020. FINDINGS: Endotracheal tube above the carina. Right IJ central venous line in similar position. Enteric tube extends below the diaphragm with tip in similar position. No significant interval change in the appearance of the lungs compared to the prior radiograph. No pleural effusion or pneumothorax. Stable cardiomegaly and vascular congestion. Median sternotomy wires. No acute osseous pathology. IMPRESSION: No significant interval change. Electronically Signed   By: Elgie Collard M.D.   On: 07/12/2020 03:51   DG Chest Port 1 View  Result Date: 07/11/2020 CLINICAL DATA:  Intubated EXAM: PORTABLE CHEST 1 VIEW COMPARISON:  07/09/2020 chest radiograph. FINDINGS: Endotracheal tube tip is 4.5 cm above the carina. Enteric tube terminates in proximal stomach. Right internal jugular central venous catheter terminates in middle third of the SVC. Intact sternotomy wires. Cardiac valvular prosthesis noted. Pacer pad overlies the left chest. Stable cardiomediastinal silhouette with mild cardiomegaly. No pneumothorax. No pleural effusion. Mild pulmonary edema, slightly improved. Right lung base  atelectasis is similar. IMPRESSION: 1. Well-positioned support structures. 2. Mild congestive heart failure, slightly improved. 3. Stable right lung base atelectasis. Electronically Signed   By: Delbert Phenix M.D.   On: 07/11/2020 07:55   DG Chest Port 1 View  Result Date: 07/09/2020 CLINICAL DATA:  Central venous catheter placement EXAM: PORTABLE CHEST 1 VIEW COMPARISON:  07/08/2020 FINDINGS: Right internal jugular central venous catheter has been placed with its tip overlying the superior vena cava.  Endotracheal tube is seen 4.8 cm above the carina. Nasogastric tube extends into the upper abdomen beyond the margin of the examination. Mild perihilar interstitial pulmonary edema, asymmetrically more severe within the left perihilar region, is again seen in keeping with probable trace perihilar pulmonary edema. No pneumothorax or pleural effusion. Mild cardiomegaly is stable. Aortic valve replacement has been performed. 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 IMPRESSION: Right internal jugular central venous catheter tip within the superior vena cava. No pneumothorax. Otherwise stable lines and tubes. Trace bilateral perihilar pulmonary edema, likely cardiogenic in nature. Stable cardiomegaly. Electronically Signed   By: Helyn Numbers MD   On: 07/09/2020 15:59   DG Chest Portable 1 View  Result Date: 07/08/2020 CLINICAL DATA:  Status post CPR and intubation EXAM: PORTABLE CHEST 1 VIEW COMPARISON:  None. FINDINGS: Cardiac shadow is enlarged. Postsurgical changes are seen. Endotracheal tube is noted in satisfactory position. Gastric catheter is noted with the tip in the mid to distal esophagus. This should be advanced several cm deeper into the stomach. Central vascular congestion is noted. No focal infiltrate is seen. No acute bony abnormality is noted. IMPRESSION: Tubes and lines as described. Gastric catheter should be advanced several cm deeper into the stomach. Central vascular  congestion consistent with the recent cardiac arrest. Electronically Signed   By: Alcide Clever M.D.   On: 07/08/2020 18:43   EEG adult  Result Date: 07/05/2020 Jefferson Fuel, MD     07/16/2020  1:15 PM Patient Name: Gumecindo Hopkin MRN: [id number] Attending: Malachi Carl. Selina Cooley MD Referring Physician/Provider: Dr. Brooke Dare Date: 07/30/2020 Duration: 45:06 Routine EEG Report  Patient history: 70 year old male status post V. fib arrest c/b myoclonic seizures.  EEG to evaluate seizures.  Level of alertness: Comatose  AEDs during EEG study: LEV, PHB. (Propofol and versed were paused 60 min prior to EEG)  Technical aspects: This EEG study was done with scalp electrodes positioned according to the 10-20 International system of electrode placement. Electrical activity was acquired at a sampling rate of 500Hz  and reviewed with a high frequency filter of 70Hz  and a low frequency filter of 1Hz . EEG data were recorded continuously and digitally stored.  Description: EEG  for majority of recording showed generalized periodic discharges at 0.75-1.5 Hz. Rarely continuous background could be observed consisting of moderate slowing with overriding fast activity. These were rare, were not typically generalized, but had no pattern to their focality I could appreciate. Hyperventilation and photic stimulation were not performed.    ABNORMALITY - Attenuated background with GPDs throughout the recording at 0.75-1.5 Hz - Brief runs of focal higher-amplitude continuous background in multiple locations without clear predilection or pattern - No A-P gradient - No sleep architecture  IMPRESSION: This study showed evidence of generalized epileptogenicity with high potential for seizures as well as profound diffuse encephalopathy. In the setting of cardiac arrest, this EEG is suggestive of anoxic/hypoxic brain injury. Patient will be transferred to [facility] today for continuous EEG.   EEG adult  Result Date: 07/10/2020 Charlsie Quest,  MD     07/10/2020  2:21 PM Patient Name: Antinio Sanderfer MRN: [id number] Epilepsy Attending: Charlsie Quest Referring Physician/Provider: Dr. Brooke Dare Date: 07/10/2020 Duration: 43.47 mins  Patient history: 70 year old male status post V. fib arrest.  EEG to evaluate seizures.  Level of alertness: Comatose  AEDs during EEG study: Propofol, Versed, LEV  Technical aspects: This EEG study was done with scalp electrodes positioned according to the 10-20 International  system of electrode placement. Electrical activity was acquired at a sampling rate of 500Hz  and reviewed with a high frequency filter of 70Hz  and a low frequency filter of 1Hz . EEG data were recorded continuously and digitally stored.  Description: EEG initially showed continuous generalized 3 to 5 Hz theta-delta slowing. After propofol and Versed was stopped, EEG showed generalized periodic epileptiform discharges with overriding fast activity at 0.75-1.5Hz . EEG gradually worsened and showed generalized bursts of highly epileptiform discharges lasting 0.5 to 3 seconds alternating with brief 1 to 2-second periods of generalized background attenuation. As propofol and Versed was restarted, EEG again showed continuous generalized sharply contoured 3 to 5 Hz theta - delta slowing. Hyperventilation and photic stimulation were not performed.    ABNORMALITY -Burst attenuation with highly epileptiform discharges, generalized -Continuous slow, generalized  IMPRESSION: This study showed evidence of generalized epileptogenicity with high potential for seizures as well as profound diffuse encephalopathy. In the setting of cardiac arrest, this EEG is suggestive of anoxic/hypoxic brain injury. Dr Iver Nestle was notified.  Charlsie Quest   EEG adult  Result Date: 07/09/2020 Charlsie Quest, MD     07/09/2020  2:03 PM Patient Name: Marlin Jarrard MRN: [id number] Epilepsy Attending: Charlsie Quest Referring Physician/Provider: Dr. Luciano Cutter Date:  07/09/2020 Duration: 33.41 mins Patient history: 70 year old male status post V. fib arrest.  EEG to evaluate seizures. Level of alertness: Comatose AEDs during EEG study: Propofol, Versed Technical aspects: This EEG study was done with scalp electrodes positioned according to the 10-20 International system of electrode placement. Electrical activity was acquired at a sampling rate of 500Hz  and reviewed with a high frequency filter of 70Hz  and a low frequency filter of 1Hz . EEG data were recorded continuously and digitally stored. Description: EEG initially showed continuous generalized background attenuation. Patient was also noted to have brief full body jerks every few seconds. Concomitant EEG showed generalized spike consistent with myoclonic seizure.  Propofol was started at around 1245 after which myoclonic seizures stopped.  he was administered at 1305 after which eeg showed continuous generalized background attenuation with generalized periodic discharges at 1 Hz.  EEG was reactive to tactile stimulation.  Hyperventilation and photic stimulation were not performed.   ABNORMALITY -Myoclonic seizures, generalized -Periodic discharges, generalized -Background attenuation, generalized IMPRESSION: This study initially showed myoclonic seizures every few seconds.  Propofol was started at around 1245 after which myoclonic seizures stopped and EEG was suggestive of generalized epileptogenicity  profound diffuse encephalopathy. These EEG findings are suggestive of anoxic/hypoxic brain injury. Dr Iver Nestle was notified. Priyanka Annabelle Harman   Overnight EEG with video  Result Date: 07/14/2020 Charlsie Quest, MD     07/15/2020  8:59 AM Patient Name:Autumn Sappenfield WUJ:[id number] Epilepsy Attending:Priyanka Annabelle Harman Referring Physician/Provider:Dr.Salman Derry Lory Duration:Aug 06, 2020 1631 to 07/14/2020 1631  Patient history:70 year old male status post V. fib arrest. EEG to evaluate seizures.  Level of  alertness:Comatose  AEDs during EEG study:Phenobarb, LEV  Technical aspects: This EEG study was done with scalp electrodes positioned according to the 10-20 International system of electrode placement. Electrical activity was acquired at a sampling rate of 500Hz  and reviewed with a high frequency filter of 70Hz  and a low frequency filter of 1Hz . EEG data were recorded continuously and digitally stored.  Description:EEG initially showed generalized periodic epileptiform discharges with overriding fast activity at 1.25-1.5Hz  which gradually worsened to 2hz  alternating with generalized EEG attenuation.Hyperventilation and photic stimulation were not performed.   ABNORMALITY - Periodic epileptiform discharges, generalized ( GPDs) - Background attenuation,  generalized  IMPRESSION: This studyshowed evidence of generalized epileptogenicity with high potential for seizures as well as profound diffuse encephalopathy. In the setting of cardiac arrest, this EEG is suggestive of anoxic/hypoxic brain injury.  Charlsie Quest  ECHOCARDIOGRAM COMPLETE  Result Date: 07/09/2020    ECHOCARDIOGRAM REPORT   Patient Name:   JAVARIS WIGINGTON Date of Exam: 07/09/2020 Medical Rec #:  [id number]      Height:       70.0 in Accession #:    [id number]     Weight:       172.8 lb Date of Birth:  [birth year]      BSA:          1.962 m Patient Age:    70 years       BP:           103/75 mmHg Patient Gender: M              HR:           47 bpm. Exam Location:  [facility] Procedure: 2D Echo, Color Doppler and Cardiac Doppler Indications:     I46.9 Cardiac arrest  History:         Patient has no prior history of Echocardiogram examinations. No                  medical Hx.  Sonographer:     Humphrey Rolls RDCS (AE) Referring Phys:  [id number] Judithe Modest Diagnosing Phys: Julien Nordmann MD  Sonographer Comments: Technically challenging study due to limited acoustic windows and echo performed with patient supine and on artificial respirator.  IMPRESSIONS  1. Left ventricular ejection fraction, by estimation, is 20 to 25%. The left ventricle has severely decreased function. The left ventricle demonstrates global hypokinesis. The left ventricular internal cavity size was severely dilated. Left ventricular diastolic parameters are indeterminate.  2. Right ventricular systolic function is moderately reduced. The right ventricular size is normal.  3. Left atrial size was moderately dilated.  4. The aortic valve was not well visualized. Aortic valve regurgitation is not visualized. Unable to estimate gradient. FINDINGS  Left Ventricle: Left ventricular ejection fraction, by estimation, is 20 to 25%. The left ventricle has severely decreased function. The left ventricle demonstrates global hypokinesis. The left ventricular internal cavity size was severely dilated. There is no left ventricular hypertrophy. Left ventricular diastolic parameters are indeterminate. Right Ventricle: The right ventricular size is normal. No increase in right ventricular wall thickness. Right ventricular systolic function is moderately reduced. Left Atrium: Left atrial size was moderately dilated. Right Atrium: Right atrial size was normal in size. Pericardium: There is no evidence of pericardial effusion. Mitral Valve: The mitral valve is normal in structure. No evidence of mitral valve regurgitation. No evidence of mitral valve stenosis. MV peak gradient, 2.2 mmHg. The mean mitral valve gradient is 1.0 mmHg. Tricuspid Valve: The tricuspid valve is normal in structure. Tricuspid valve regurgitation is not demonstrated. No evidence of tricuspid stenosis. Aortic Valve: The aortic valve was not well visualized. Aortic valve regurgitation is not visualized. Unabvle to exclude stenosis. Grossly Aortic valve mean gradient measures 7.0 mmHg. Aortic valve peak gradient measures 13.7 mmHg. Pulmonic Valve: The pulmonic valve was normal in structure. Pulmonic valve regurgitation is not  visualized. No evidence of pulmonic stenosis. Aorta: The aortic root is normal in size and structure. Venous: The inferior vena cava is normal in size with greater than 50% respiratory variability, suggesting right atrial pressure of 3 mmHg. IAS/Shunts: No  atrial level shunt detected by color flow Doppler.  LEFT VENTRICLE PLAX 2D LVIDd:         6.60 cm LVIDs:         6.30 cm LV PW:         1.60 cm LV IVS:        1.20 cm LVOT diam:     1.90 cm LV SV:         23 LV SV Index:   12 LVOT Area:     2.84 cm  LEFT ATRIUM           Index LA diam:      5.20 cm 2.65 cm/m LA Vol (A4C): 53.3 ml 27.17 ml/m  AORTIC VALVE AV Area (Vmax):    0.75 cm AV Area (Vmean):   0.73 cm AV Area (VTI):     0.74 cm AV Vmax:           185.00 cm/s AV Vmean:          120.000 cm/s AV VTI:            0.315 m AV Peak Grad:      13.7 mmHg AV Mean Grad:      7.0 mmHg LVOT Vmax:         49.00 cm/s LVOT Vmean:        30.800 cm/s LVOT VTI:          0.082 m LVOT/AV VTI ratio: 0.26  AORTA Ao Root diam: 2.50 cm MITRAL VALVE MV Area (PHT): 4.71 cm    SHUNTS MV Area VTI:   0.79 cm    Systemic VTI:  0.08 m MV Peak grad:  2.2 mmHg    Systemic Diam: 1.90 cm MV Mean grad:  1.0 mmHg MV Vmax:       0.75 m/s MV Vmean:      41.7 cm/s MV Decel Time: 161 msec MV E velocity: 46.70 cm/s MV A velocity: 70.70 cm/s MV E/A ratio:  0.66 Julien Nordmann MD Electronically signed by Julien Nordmann MD Signature Date/Time: 07/09/2020/4:59:51 PM    Final     Microbiology Recent Results (from the past 240 hour(s))  Urine Culture     Status: None   Collection Time: 07/08/20  6:15 PM   Specimen: Urine, Random  Result Value Ref Range Status   Specimen Description   Final    URINE, RANDOM Performed at [facility], [address], [place], [place] [address]    Special Requests   Final    NONE Performed at [facility], [address], [place], [place] [address]    Culture   Final    NO GROWTH Performed at [facility], [address] [place], [place] [address]    Report Status 07/10/2020 FINAL  Final  Resp Panel by RT-PCR (Flu A&B, Covid) Nasopharyngeal Swab     Status: None   Collection Time: 07/08/20  6:46 PM   Specimen: Nasopharyngeal Swab; Nasopharyngeal(NP) swabs in vial transport medium  Result Value Ref Range Status   SARS Coronavirus 2 by RT PCR NEGATIVE NEGATIVE Final    Comment: (NOTE) SARS-CoV-2 target nucleic acids are NOT DETECTED.  The SARS-CoV-2 RNA is generally detectable in upper respiratory specimens during the acute phase of infection. The lowest concentration of SARS-CoV-2 viral copies this assay can detect is 138 copies/mL. A negative result does not preclude SARS-Cov-2 infection and should not be used as the sole basis for treatment or other patient  management decisions. A negative result may occur with  improper specimen collection/handling, submission of specimen other than nasopharyngeal swab, presence of viral mutation(s) within the areas targeted by this assay, and inadequate number of viral copies(<138 copies/mL). A negative result must be combined with clinical observations, patient history, and epidemiological information. The expected result is Negative.  Fact Sheet for Patients:  BloggerCourse.com  Fact Sheet for Healthcare Providers:  SeriousBroker.it  This test is no t yet approved or cleared by the [place] FDA and  has been authorized for detection and/or diagnosis of SARS-CoV-2 by FDA under an Emergency Use Authorization (EUA). This EUA will remain  in effect (meaning this test can be used) for the duration of the COVID-19 declaration under Section 564(b)(1) of the Act, 21 U.S.C.section 360bbb-3(b)(1), unless the authorization is terminated  or revoked sooner.       Influenza A by PCR NEGATIVE NEGATIVE Final   Influenza B by PCR NEGATIVE NEGATIVE Final    Comment: (NOTE) The Xpert Xpress SARS-CoV-2/FLU/RSV plus  assay is intended as an aid in the diagnosis of influenza from Nasopharyngeal swab specimens and should not be used as a sole basis for treatment. Nasal washings and aspirates are unacceptable for Xpert Xpress SARS-CoV-2/FLU/RSV testing.  Fact Sheet for Patients: BloggerCourse.com  Fact Sheet for Healthcare Providers: SeriousBroker.it  This test is not yet approved or cleared by the [place] FDA and has been authorized for detection and/or diagnosis of SARS-CoV-2 by FDA under an Emergency Use Authorization (EUA). This EUA will remain in effect (meaning this test can be used) for the duration of the COVID-19 declaration under Section 564(b)(1) of the Act, 21 U.S.C. section 360bbb-3(b)(1), unless the authorization is terminated or revoked.  Performed at [facility], [address] [place], [place] [address]   Culture, blood (routine x 2)     Status: None   Collection Time: 07/08/20  6:46 PM   Specimen: BLOOD  Result Value Ref Range Status   Specimen Description BLOOD BLOOD RIGHT FOREARM  Final   Special Requests   Final    BOTTLES DRAWN AEROBIC AND ANAEROBIC Blood Culture adequate volume   Culture   Final    NO GROWTH 5 DAYS Performed at [facility], [address] [place], [place] [address]    Report Status 07/08/2020 FINAL  Final  Culture, blood (routine x 2)     Status: None   Collection Time: 07/08/20  6:47 PM   Specimen: BLOOD  Result Value Ref Range Status   Specimen Description BLOOD BLOOD LEFT FOREARM  Final   Special Requests   Final    BOTTLES DRAWN AEROBIC AND ANAEROBIC Blood Culture adequate volume   Culture   Final    NO GROWTH 5 DAYS Performed at [facility], [address] [place], [place] [address]    Report Status 07/26/2020 FINAL  Final  MRSA PCR Screening     Status: None   Collection Time: 07/08/20  9:20 PM   Specimen: Nasal Mucosa; Nasopharyngeal  Result Value  Ref Range Status   MRSA by PCR NEGATIVE NEGATIVE Final    Comment:        The GeneXpert MRSA Assay (FDA approved for NASAL specimens only), is one component of a comprehensive MRSA colonization surveillance program. It is not intended to diagnose MRSA infection nor to guide or monitor treatment for MRSA infections. Performed at [facility], [address] [place], [place] [address]   Culture, Respiratory w Gram Stain  Status: None   Collection Time: 07/09/20 11:31 AM   Specimen: Tracheal Aspirate; Respiratory  Result Value Ref Range Status   Specimen Description   Final    TRACHEAL ASPIRATE Performed at [facility], [address] [place], [place] [address]    Special Requests   Final    NONE Performed at [facility], [address] [place], [place] [address]    Gram Stain   Final    ABUNDANT WBC PRESENT, PREDOMINANTLY PMN FEW GRAM POSITIVE COCCI RARE GRAM POSITIVE RODS    Culture   Final    FEW Consistent with normal respiratory flora. No Pseudomonas species isolated Performed at [facility], [address] [place], [place] [address]    Report Status 07/11/2020 FINAL  Final  Culture, Respiratory w Gram Stain     Status: None (Preliminary result)   Collection Time: 07/15/20 10:54 AM   Specimen: Tracheal Aspirate; Respiratory  Result Value Ref Range Status   Specimen Description TRACHEAL ASPIRATE  Final   Special Requests NONE  Final   Gram Stain   Final    RARE WBC PRESENT,BOTH PMN AND MONONUCLEAR RARE GRAM POSITIVE COCCI IN PAIRS IN CHAINS    Culture   Final    CULTURE REINCUBATED FOR BETTER GROWTH Performed at [facility], [address] [place], [place] [address]    Report Status PENDING  Incomplete    Lab Basic Metabolic Panel: Recent Labs  Lab 07/11/20 1715 07/12/20 0300 02/13/2021 0433 07/14/20 0349 07/14/20 0352 07/15/20 0454 07/16/20 0337  NA  --  137 138 139 139 138 142  K  --  4.4 4.1 4.3  4.2 4.0 3.8  CL  --  110 110 107  --  106 109  CO2  --  18* 23 21*  --  20* 20*  GLUCOSE  --  190* 177* 149*  --  181* 160*  BUN  --  22 21 12   --  18 21  CREATININE  --  1.11 1.00 0.92  --  1.04 1.00  CALCIUM  --  8.2* 8.4* 8.9  --  9.1 9.0  MG 2.0 1.9 1.9 1.9  --  1.9 2.2  PHOS 3.0 2.3* 2.3* 3.5  --  2.8  --    Liver Function Tests: Recent Labs  Lab 07/10/20 1622 02/13/2021 0433  AST 77* 32  ALT 100* 34  ALKPHOS 83 72  BILITOT 0.7 0.8  PROT 7.1 6.1*  ALBUMIN 3.4* 2.6*   No results for input(s): LIPASE, AMYLASE in the last 168 hours. No results for input(s): AMMONIA in the last 168 hours. CBC: Recent Labs  Lab 07/11/20 0445 07/12/20 0300 02/13/2021 0433 07/14/20 0349 07/14/20 0352 07/15/20 0454 07/16/20 0337  WBC 17.4* 16.7* 15.4* 16.9*  --  15.7* 18.3*  NEUTROABS 13.0*  --  10.3*  --   --   --   --   HGB 15.7 14.1 12.3* 13.3 14.6 14.0 14.1  HCT 51.6 47.4 40.9 43.1 43.0 47.2 47.5  MCV 65.9* 65.7* 65.4* 63.8*  --  65.7* 66.4*  PLT 545* 599* 440* 515*  --  475* 323   Cardiac Enzymes: No results for input(s): CKTOTAL, CKMB, CKMBINDEX, TROPONINI in the last 168 hours. Sepsis Labs: Recent Labs  Lab 07/10/20 1139 07/11/20 0445 07/11/20 1652 07/12/20 0300 02/13/2021 0433 07/14/20 0349 07/15/20 0454 07/16/20 0337  WBC  --    < >  --    < > 15.4* 16.9* 15.7*  18.3*  LATICACIDVEN 2.0*  --  1.3  --   --   --   --   --    < > = values in this interval not displayed.    Procedures/Operations     [employer] 2020-07-24, 9:37 AM

## 2020-08-05 DEATH — deceased

## 2021-06-05 IMAGING — DX DG ABDOMEN 1V
1 series · 1 of 1 positions shown · non-contrast
Comparison: None.

CLINICAL DATA: NG tube placement

EXAM:
ABDOMEN - 1 VIEW

[abdomen supine]
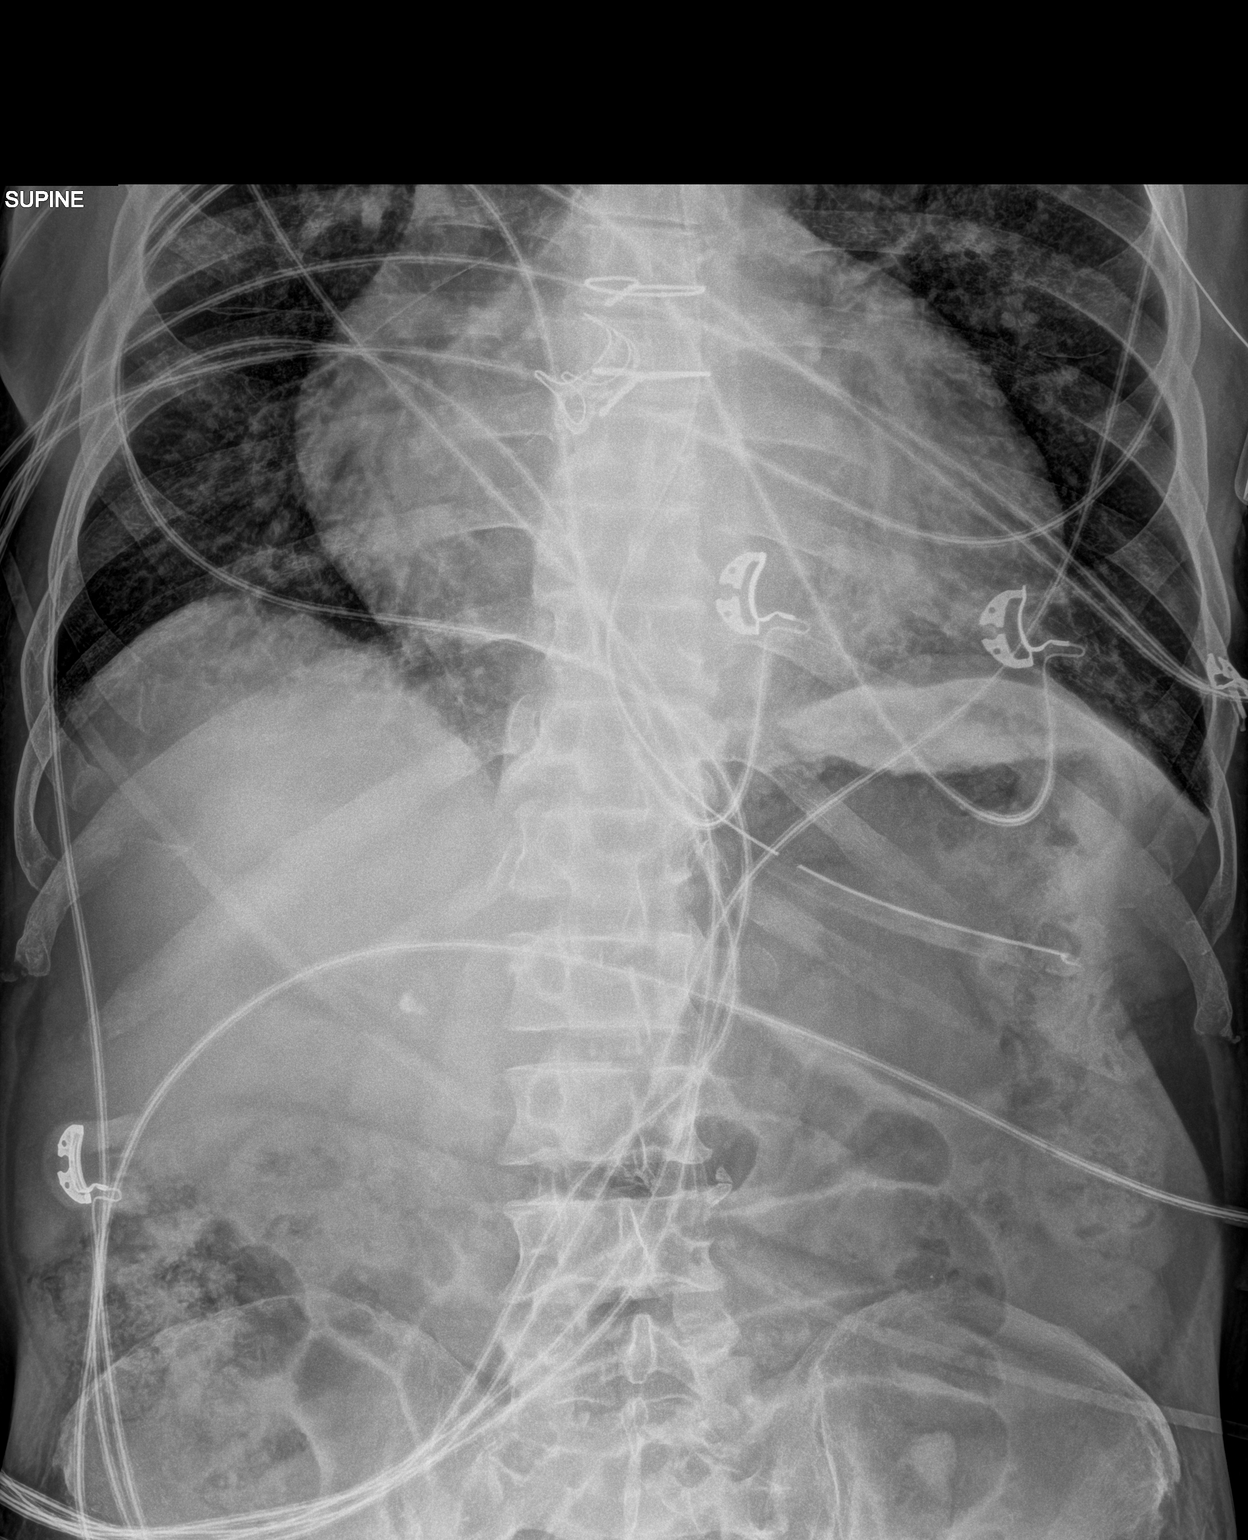

[1 of 1 positions shown; findings below may reference images not displayed]

FINDINGS: Enteric tube terminates in the proximal gastric body.

Nonobstructive bowel gas pattern.

Lung bases are clear.

Cardiomegaly.  Prosthetic valve.
IMPRESSION: Enteric tube terminates in the proximal gastric body.

## 2021-06-05 IMAGING — DX DG CHEST 1V
1 series · 1 of 1 positions shown · non-contrast
Comparison: 01/06/2020 at 9422 hours

CLINICAL DATA: Post intubation

EXAM:
CHEST  1 VIEW

[chest ap]
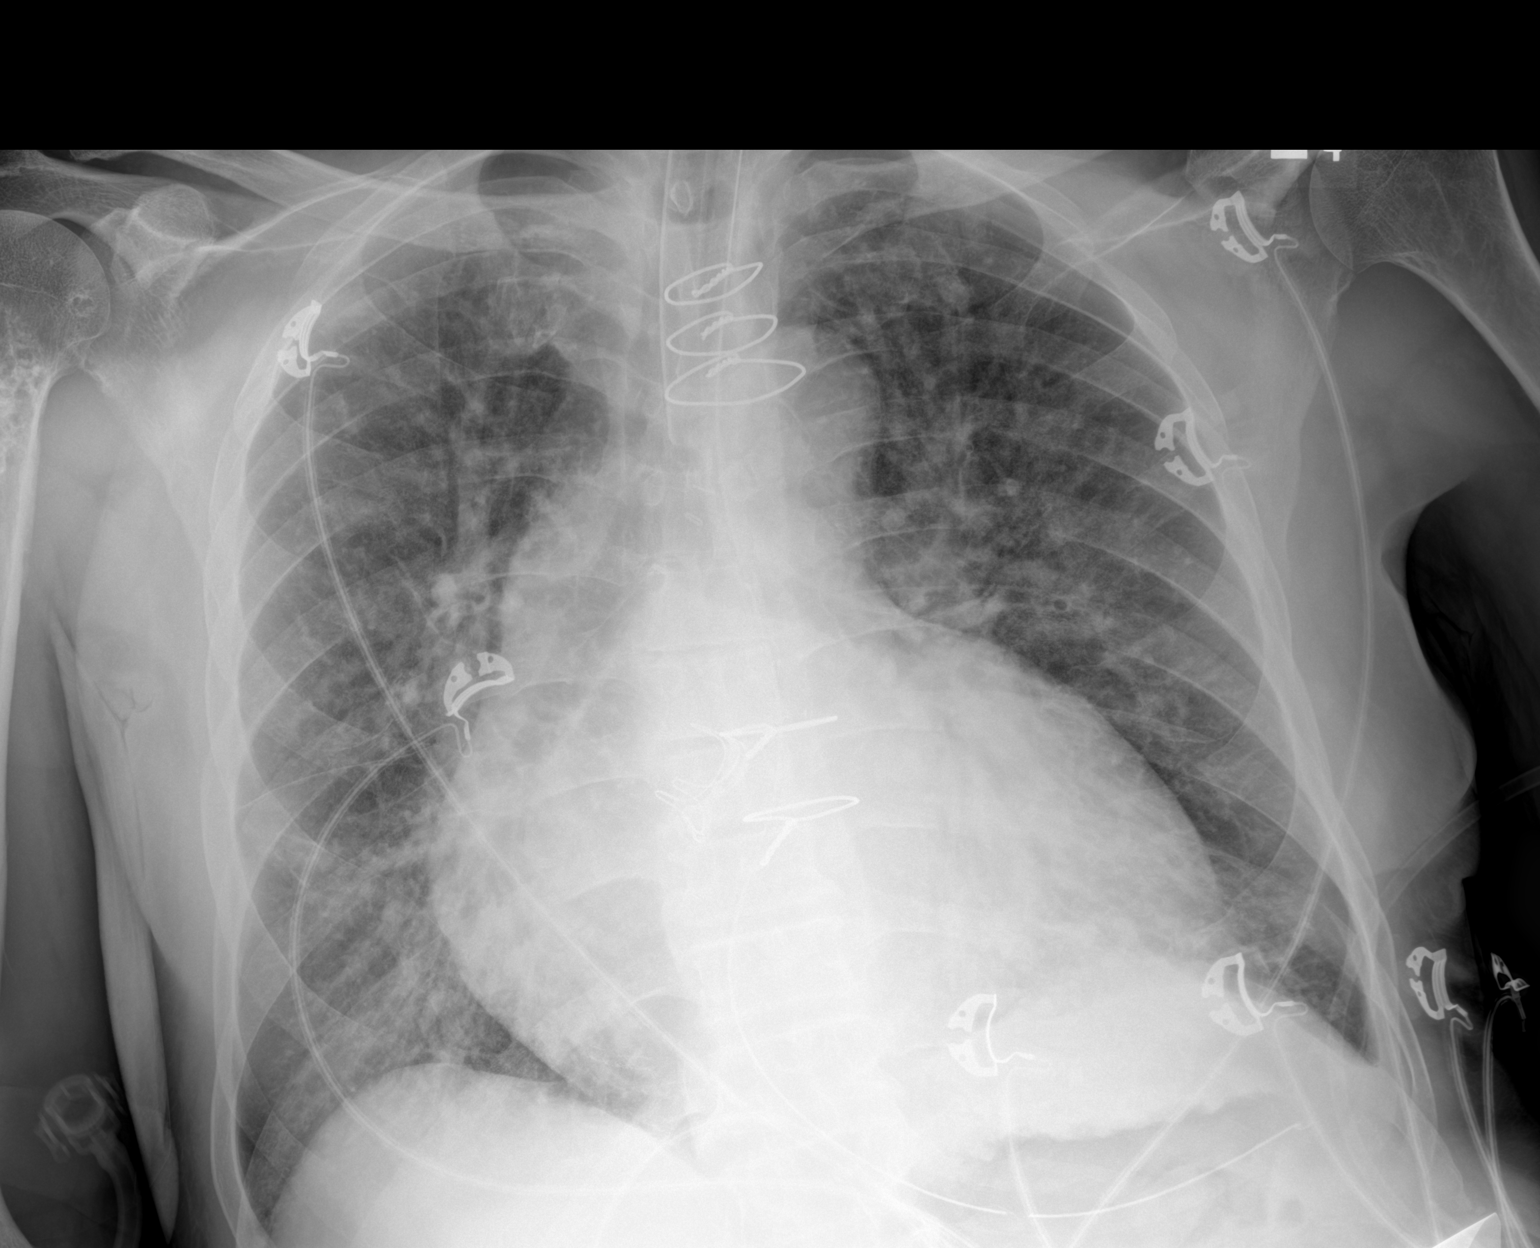

[1 of 1 positions shown; findings below may reference images not displayed]

FINDINGS: Endotracheal tube terminates 3.5 cm above the carina.

Mild increased perihilar markings, favoring mild interstitial edema.
Mild left basilar opacity, possibly atelectasis. Trace left pleural
effusion. No pneumothorax.

Cardiomegaly.  Prosthetic valve.

Enteric tube terminates in the proximal gastric body.

Suspected bone infarct involving the proximal right humerus.
IMPRESSION: Endotracheal tube terminates 3.5 cm above the carina.

Suspected mild interstitial edema with trace left pleural effusion.

Mild left basilar opacity, likely atelectasis.

## 2021-06-08 IMAGING — DX DG CHEST 1V
1 series · 1 of 1 positions shown · non-contrast
Comparison: Earlier today.

CLINICAL DATA: PICC placement.

EXAM:
CHEST  1 VIEW

[chest ap]
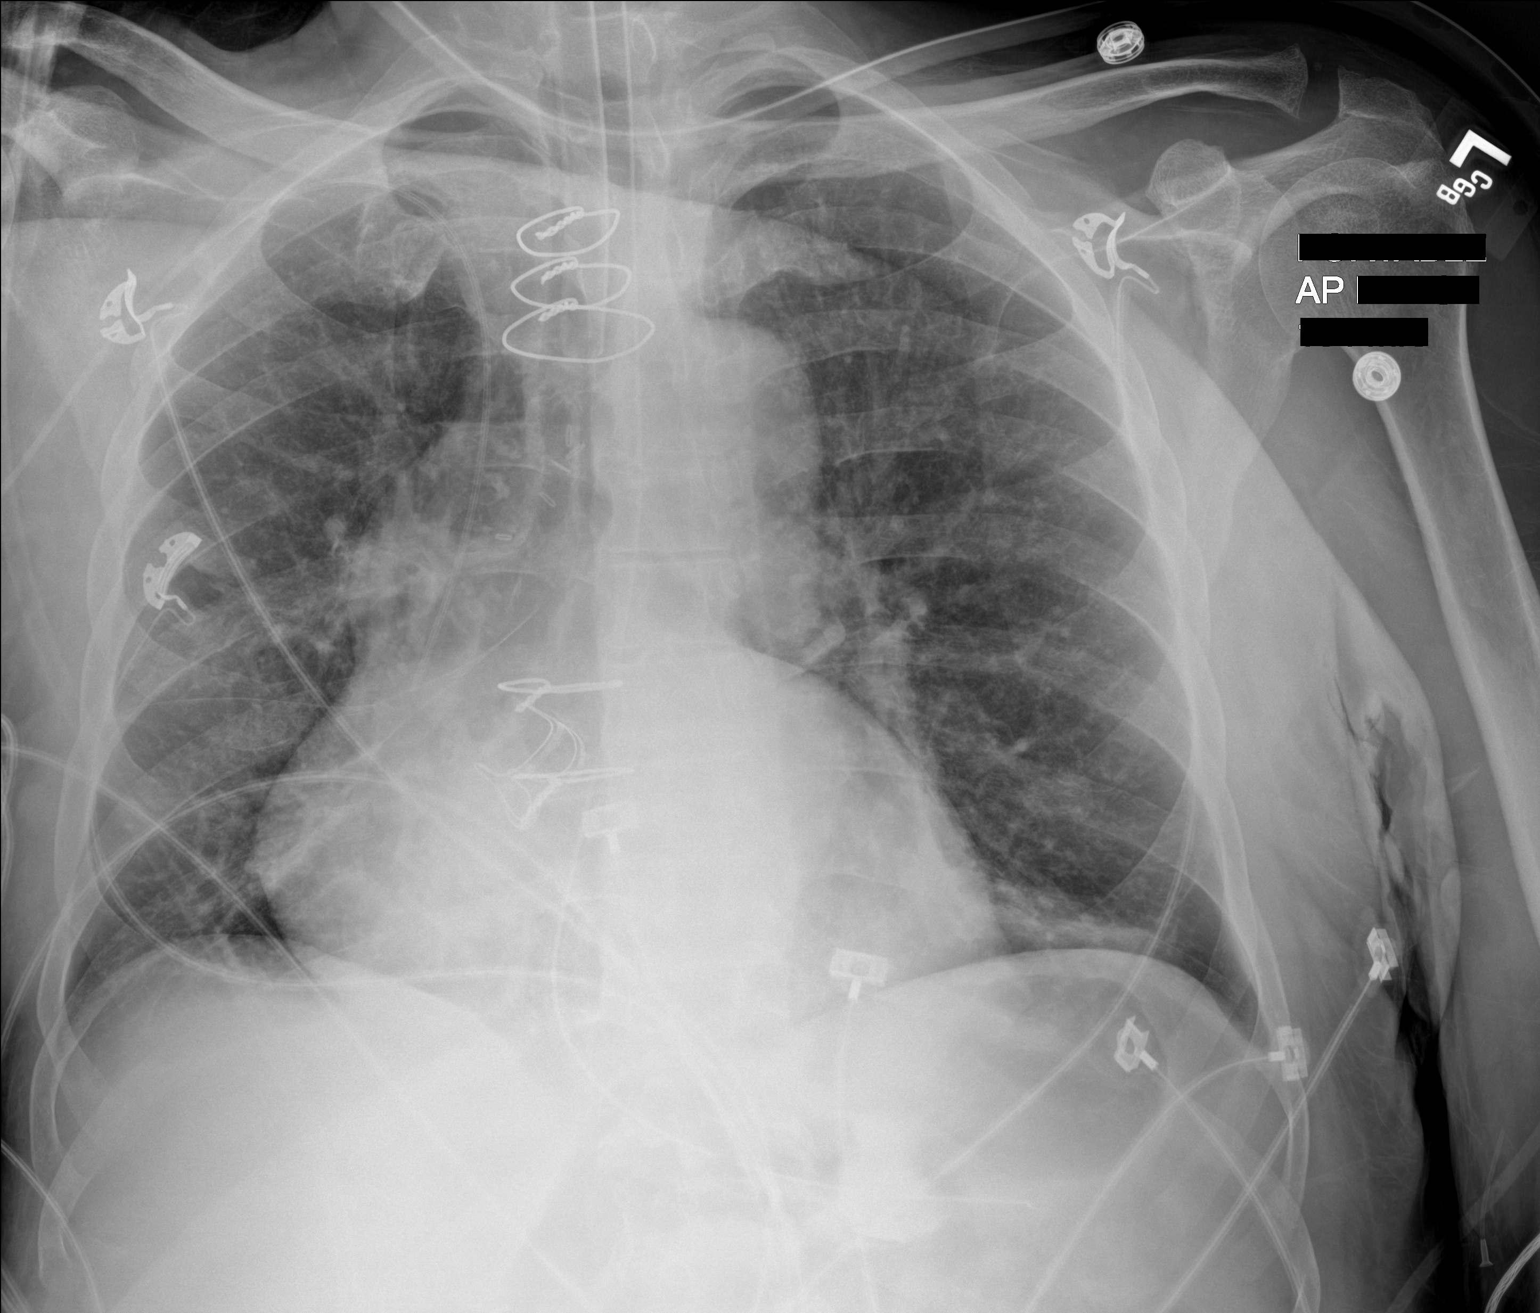

[1 of 1 positions shown; findings below may reference images not displayed]

FINDINGS: The right PICC has been repositioned with its tip at the superior
cavoatrial junction. Endotracheal tube in satisfactory position.
Stable median sternotomy wires and prosthetic heart valve. Stable
enlarged heart. Interval mild increase in linear atelectasis at the
left lung base. Stable mild diffuse peribronchial thickening.
Unremarkable bones.
IMPRESSION: 1. Satisfactory position of the right PICC with its tip at the
superior cavoatrial junction.
2. Mildly increased left basilar atelectasis.
3. Stable mild chronic bronchitic changes.
4. Stable cardiomegaly.

## 2021-06-08 IMAGING — DX DG CHEST 1V PORT
1 series · 1 of 1 positions shown · non-contrast
Comparison: 01/08/2020

CLINICAL DATA: Shortness of breath.

EXAM:
PORTABLE CHEST 1 VIEW

[chest ap]
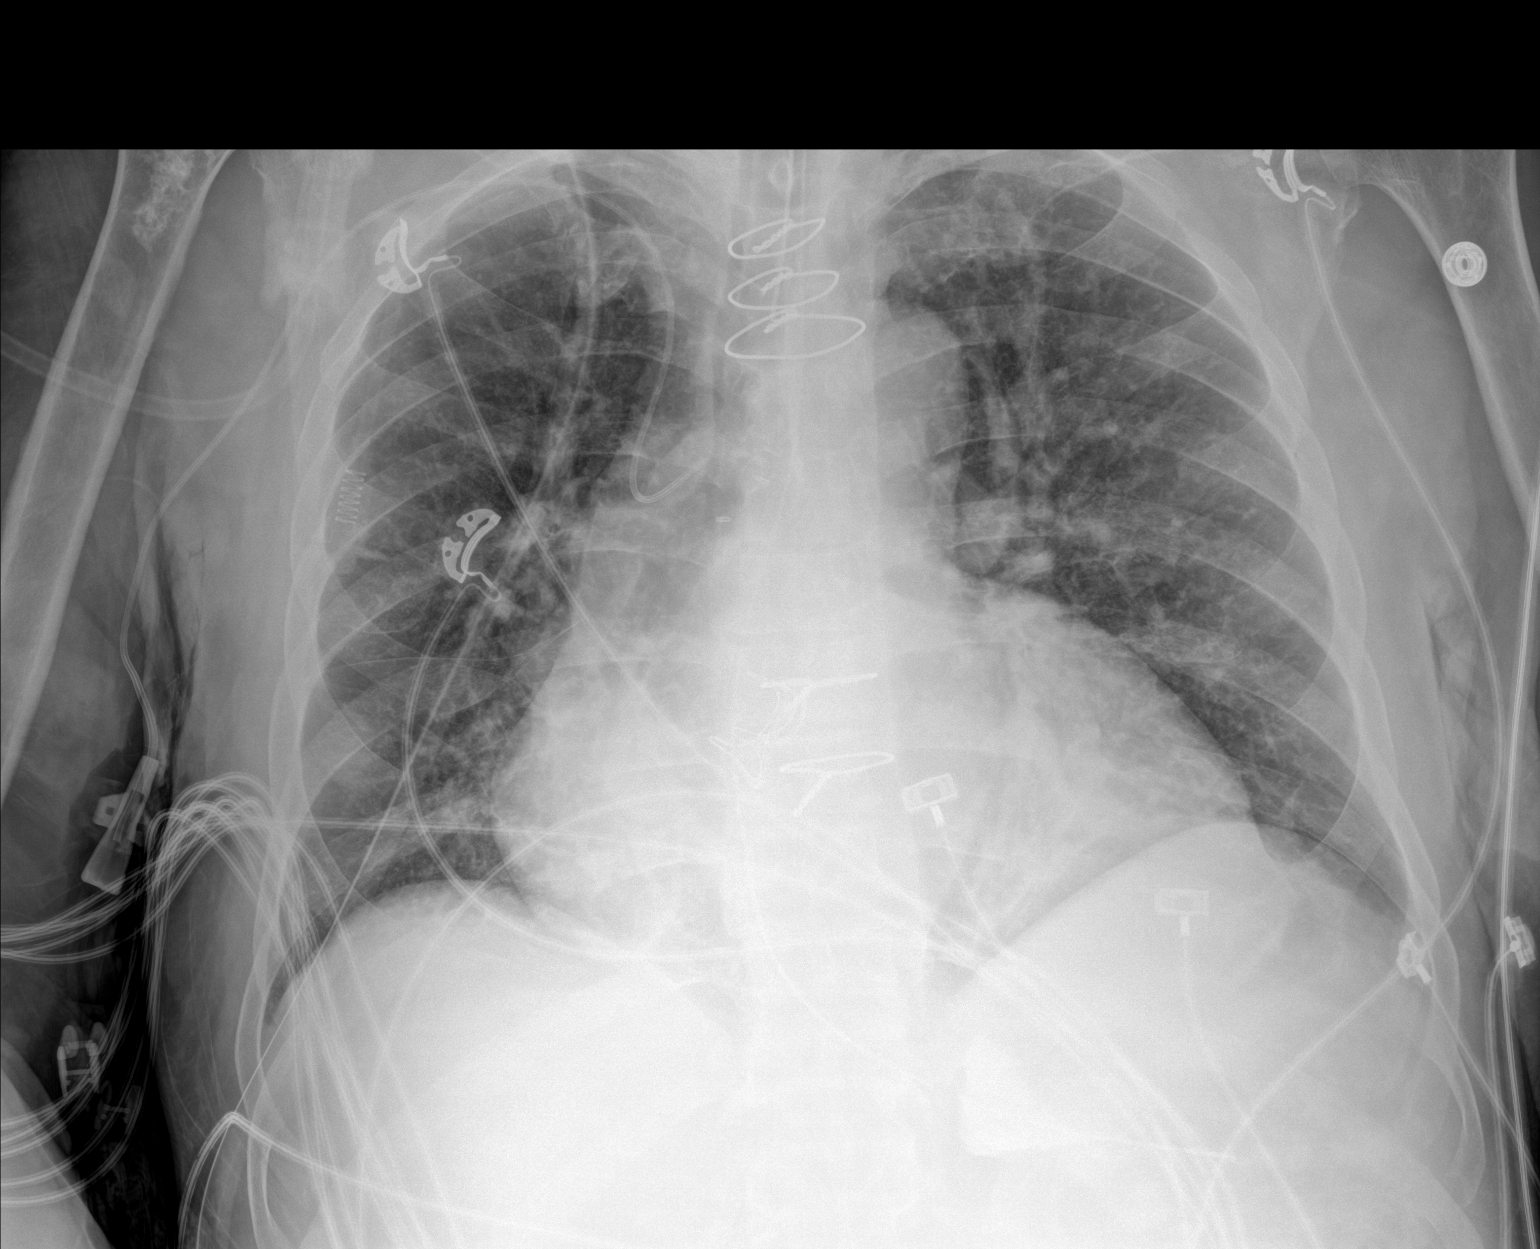

[1 of 1 positions shown; findings below may reference images not displayed]

FINDINGS: 7170 hours. Low lung volumes. The cardio pericardial silhouette is
enlarged. Endotracheal tube tip is 5.5 cm above the base of the
carina. Right PICC line tip has been repositioned in the interval,
likely in the azygos vein. Vascular congestion again noted with
underlying chronic interstitial changes. Retrocardiac
atelectasis/infiltrate is similar to prior. Telemetry leads overlie
the chest.
IMPRESSION: 1. Right PICC line tip appears to have flipped up into the azygos
vein.
2. Otherwise stable exam.
3. Low volume film with persistent retrocardiac
atelectasis/infiltrate.

These results will be called to the ordering clinician or
representative by the Radiologist Assistant, and communication
documented in the PACS or [REDACTED].

## 2021-06-10 IMAGING — DX DG CHEST 1V PORT
1 series · 1 of 1 positions shown · non-contrast
Comparison: Radiograph 01/09/2020

CLINICAL DATA: Endotracheal tube present.

EXAM:
PORTABLE CHEST 1 VIEW

[chest ap]
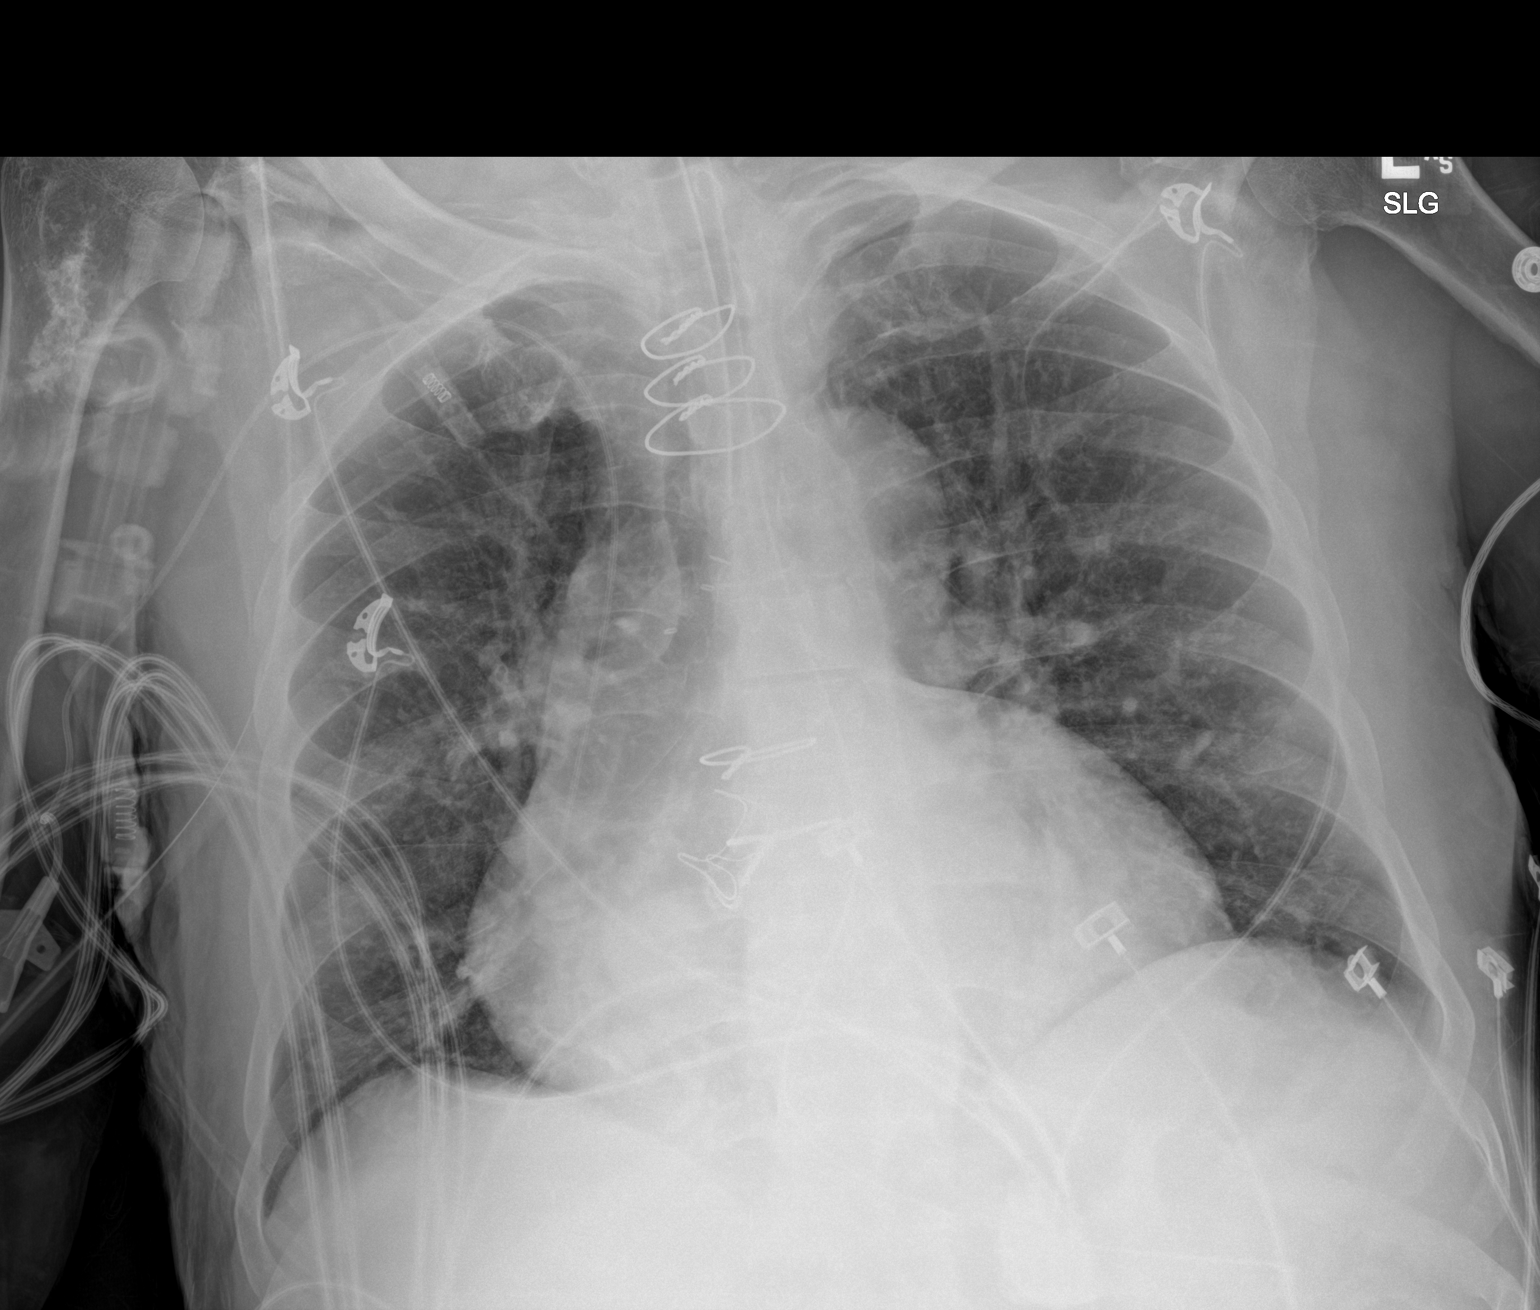

[1 of 1 positions shown; findings below may reference images not displayed]

FINDINGS: Endotracheal tube tip 3.6 cm from the carina. Enteric tube in place
with tip and side-port below the diaphragm. Right upper extremity
PICC in place. Post median sternotomy with prosthetic cardiac valve.
Stable cardiomegaly. Persistent streaky bibasilar atelectasis.
Peribronchial thickening appears similar. No new airspace disease.
No pneumothorax. No large pleural effusion. Probable enchondroma
right proximal humerus.
IMPRESSION: 1. Stable support apparatus.
2. Stable cardiomegaly and peribronchial thickening. Streaky
bibasilar atelectasis.
